# Patient Record
Sex: Female | Born: 1945 | Race: White | Hispanic: No | Marital: Married | Smoking: Never smoker
Health system: Southern US, Community
[De-identification: ages and names within clinical notes are randomized; demographics above are authoritative.]

## PROBLEM LIST (undated history)

## (undated) DIAGNOSIS — I10 Essential (primary) hypertension: Secondary | ICD-10-CM

## (undated) DIAGNOSIS — E785 Hyperlipidemia, unspecified: Secondary | ICD-10-CM

## (undated) DIAGNOSIS — E119 Type 2 diabetes mellitus without complications: Secondary | ICD-10-CM

## (undated) DIAGNOSIS — I82409 Acute embolism and thrombosis of unspecified deep veins of unspecified lower extremity: Secondary | ICD-10-CM

## (undated) DIAGNOSIS — C9000 Multiple myeloma not having achieved remission: Secondary | ICD-10-CM

## (undated) DIAGNOSIS — M1990 Unspecified osteoarthritis, unspecified site: Secondary | ICD-10-CM

## (undated) DIAGNOSIS — M179 Osteoarthritis of knee, unspecified: Secondary | ICD-10-CM

## (undated) DIAGNOSIS — I213 ST elevation (STEMI) myocardial infarction of unspecified site: Secondary | ICD-10-CM

## (undated) DIAGNOSIS — I2699 Other pulmonary embolism without acute cor pulmonale: Secondary | ICD-10-CM

## (undated) DIAGNOSIS — E669 Obesity, unspecified: Secondary | ICD-10-CM

## (undated) DIAGNOSIS — M1710 Unilateral primary osteoarthritis, unspecified knee: Secondary | ICD-10-CM

## (undated) DIAGNOSIS — Z9289 Personal history of other medical treatment: Secondary | ICD-10-CM

## (undated) DIAGNOSIS — N390 Urinary tract infection, site not specified: Secondary | ICD-10-CM

## (undated) HISTORY — PX: ANKLE FRACTURE SURGERY: SHX122

## (undated) HISTORY — PX: HERNIA REPAIR: SHX51

## (undated) HISTORY — PX: CLAVICLE SURGERY: SHX598

## (undated) HISTORY — PX: FRACTURE SURGERY: SHX138

## (undated) HISTORY — DX: Essential (primary) hypertension: I10

## (undated) HISTORY — DX: Obesity, unspecified: E66.9

## (undated) HISTORY — DX: Other pulmonary embolism without acute cor pulmonale: I26.99

## (undated) HISTORY — DX: Osteoarthritis of knee, unspecified: M17.9

## (undated) HISTORY — DX: Unilateral primary osteoarthritis, unspecified knee: M17.10

---
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
## 1989-08-18 HISTORY — PX: VAGINAL HYSTERECTOMY: SUR661

## 1999-11-24 ENCOUNTER — Other Ambulatory Visit: Admission: RE | Admit: 1999-11-24 | Discharge: 1999-11-24 | Payer: Self-pay | Admitting: Obstetrics and Gynecology

## 2001-03-08 ENCOUNTER — Other Ambulatory Visit: Admission: RE | Admit: 2001-03-08 | Discharge: 2001-03-08 | Payer: Self-pay | Admitting: Obstetrics and Gynecology

## 2001-07-23 ENCOUNTER — Encounter: Payer: Self-pay | Admitting: Orthopaedic Surgery

## 2001-07-23 ENCOUNTER — Encounter: Admission: RE | Admit: 2001-07-23 | Discharge: 2001-07-23 | Payer: Self-pay | Admitting: Orthopaedic Surgery

## 2002-04-02 ENCOUNTER — Other Ambulatory Visit: Admission: RE | Admit: 2002-04-02 | Discharge: 2002-04-02 | Payer: Self-pay | Admitting: Obstetrics and Gynecology

## 2002-10-05 ENCOUNTER — Inpatient Hospital Stay (HOSPITAL_COMMUNITY): Admission: EM | Admit: 2002-10-05 | Discharge: 2002-10-06 | Payer: Self-pay | Admitting: Emergency Medicine

## 2002-10-05 ENCOUNTER — Encounter: Payer: Self-pay | Admitting: Orthopedic Surgery

## 2002-10-05 ENCOUNTER — Encounter: Payer: Self-pay | Admitting: Emergency Medicine

## 2006-06-07 ENCOUNTER — Encounter: Admission: RE | Admit: 2006-06-07 | Discharge: 2006-06-07 | Payer: Self-pay | Admitting: Cardiology

## 2006-10-25 ENCOUNTER — Encounter: Admission: RE | Admit: 2006-10-25 | Discharge: 2006-10-25 | Payer: Self-pay | Admitting: Diagnostic Radiology

## 2006-11-01 ENCOUNTER — Encounter: Admission: RE | Admit: 2006-11-01 | Discharge: 2006-11-01 | Payer: Self-pay | Admitting: Interventional Radiology

## 2006-11-29 ENCOUNTER — Encounter: Admission: RE | Admit: 2006-11-29 | Discharge: 2006-11-29 | Payer: Self-pay | Admitting: Diagnostic Radiology

## 2007-09-12 ENCOUNTER — Encounter: Admission: RE | Admit: 2007-09-12 | Discharge: 2007-09-12 | Payer: Self-pay | Admitting: Diagnostic Radiology

## 2009-02-10 ENCOUNTER — Ambulatory Visit: Payer: Self-pay | Admitting: Cardiovascular Disease

## 2009-02-11 ENCOUNTER — Inpatient Hospital Stay (HOSPITAL_COMMUNITY): Admission: EM | Admit: 2009-02-11 | Discharge: 2009-02-13 | Payer: Self-pay | Admitting: Family Medicine

## 2009-10-04 ENCOUNTER — Observation Stay (HOSPITAL_COMMUNITY): Admission: EM | Admit: 2009-10-04 | Discharge: 2009-10-05 | Payer: Self-pay | Admitting: Emergency Medicine

## 2009-10-05 ENCOUNTER — Ambulatory Visit: Payer: Self-pay | Admitting: Vascular Surgery

## 2009-10-05 ENCOUNTER — Encounter (INDEPENDENT_AMBULATORY_CARE_PROVIDER_SITE_OTHER): Payer: Self-pay | Admitting: Emergency Medicine

## 2010-03-08 ENCOUNTER — Encounter: Admission: RE | Admit: 2010-03-08 | Discharge: 2010-03-08 | Payer: Self-pay | Admitting: Diagnostic Radiology

## 2010-11-08 ENCOUNTER — Ambulatory Visit: Payer: Self-pay | Admitting: Cardiology

## 2010-11-08 LAB — LIPID PANEL
HDL: 45 mg/dL (ref 35–70)
LDL Cholesterol: 177 mg/dL

## 2011-01-08 ENCOUNTER — Encounter: Payer: Self-pay | Admitting: Cardiology

## 2011-01-08 ENCOUNTER — Encounter: Payer: Self-pay | Admitting: Diagnostic Radiology

## 2011-01-09 ENCOUNTER — Encounter: Payer: Self-pay | Admitting: Diagnostic Radiology

## 2011-01-21 ENCOUNTER — Encounter: Payer: Self-pay | Admitting: Cardiology

## 2011-01-21 DIAGNOSIS — Q8500 Neurofibromatosis, unspecified: Secondary | ICD-10-CM | POA: Insufficient documentation

## 2011-01-21 DIAGNOSIS — E669 Obesity, unspecified: Secondary | ICD-10-CM | POA: Insufficient documentation

## 2011-01-21 DIAGNOSIS — E1122 Type 2 diabetes mellitus with diabetic chronic kidney disease: Secondary | ICD-10-CM | POA: Insufficient documentation

## 2011-01-21 DIAGNOSIS — M1710 Unilateral primary osteoarthritis, unspecified knee: Secondary | ICD-10-CM | POA: Insufficient documentation

## 2011-01-21 DIAGNOSIS — I10 Essential (primary) hypertension: Secondary | ICD-10-CM | POA: Insufficient documentation

## 2011-03-10 ENCOUNTER — Ambulatory Visit (INDEPENDENT_AMBULATORY_CARE_PROVIDER_SITE_OTHER): Payer: 59 | Admitting: Cardiology

## 2011-03-10 ENCOUNTER — Encounter: Payer: Self-pay | Admitting: Cardiology

## 2011-03-10 DIAGNOSIS — R5383 Other fatigue: Secondary | ICD-10-CM

## 2011-03-10 DIAGNOSIS — R079 Chest pain, unspecified: Secondary | ICD-10-CM | POA: Insufficient documentation

## 2011-03-10 DIAGNOSIS — J019 Acute sinusitis, unspecified: Secondary | ICD-10-CM | POA: Insufficient documentation

## 2011-03-10 DIAGNOSIS — E119 Type 2 diabetes mellitus without complications: Secondary | ICD-10-CM

## 2011-03-10 DIAGNOSIS — E7800 Pure hypercholesterolemia, unspecified: Secondary | ICD-10-CM

## 2011-03-10 LAB — COMPREHENSIVE METABOLIC PANEL
AST: 24 U/L (ref 0–37)
Alkaline Phosphatase: 56 U/L (ref 39–117)
Total Bilirubin: 0.7 mg/dL (ref 0.3–1.2)
Total Protein: 6.8 g/dL (ref 6.0–8.3)

## 2011-03-10 LAB — LIPID PANEL
Total CHOL/HDL Ratio: 5.4 Ratio
Triglycerides: 125 mg/dL (ref <150)

## 2011-03-10 MED ORDER — AMOXICILLIN 500 MG PO CAPS: 500.0000 mg | ORAL_CAPSULE | Freq: Three times a day (TID) | ORAL | Status: AC

## 2011-03-10 NOTE — Assessment & Plan Note (Signed)
The cause of her fatigue is not known.  It may be related to her recent sinusitis and bronchitis.  Less likely would be hypothyroidism or unrecognized anemia.  We will plan to have her return soon for a CBC and a TSH

## 2011-03-10 NOTE — Assessment & Plan Note (Signed)
We will treat her sinusitis and cough with amoxicillin 500 mg 3 times a day for one week.  We will get a CBC to look at her white count.  She will continue with Mucinex

## 2011-03-10 NOTE — Assessment & Plan Note (Signed)
Her electrocardiogram shows no ischemic changes.  She had a normal cardiac catheterization in February 2010.  We'll continue to observe and continue same blood pressure meds

## 2011-03-10 NOTE — Progress Notes (Signed)
History of Present Illness: This pleasant 65 year old woman is seen for a followup office visit.  He has a complex past medical history.  She has a history of neurofibromatosis.  She has a history of diabetes and essential hypertension.  In 2010 she was hospitalized with cellulitis of the right leg.  She had chest pain in February 2010 and had cardiac catheterization by Dr. Peter Swaziland which was normal on 02/11/09.  Recently she's been experiencing some left-sided chest discomfort often with exertion and relieved by rest.  There is been no left arm radiation.  The patient is also had several other recent symptoms including symptoms of sinusitis and bronchitis with yellow sputum but no fever.  For the past several weeks she has felt unaccountably week but has not had any evidence of anemia or melena or hematochezia.  She does not have any history of hypothyroidism.  Her EKG today shows normal sinus rhythm and no ischemic changes.  Current Outpatient Prescriptions  Medication Sig Dispense Refill  . Acetaminophen (TYLENOL PO) Take by mouth as needed.        Marland Kitchen aspirin 325 MG tablet Take 325 mg by mouth daily.        . Calcium Carbonate-Vitamin D (CALCIUM + D PO) Take by mouth daily.        Marland Kitchen CINNAMON PO Take by mouth. OCCASIONALLY       . fish oil-omega-3 fatty acids 1000 MG capsule Take by mouth daily.        Marland Kitchen glimepiride (AMARYL) 2 MG tablet Take 2.5 mg by mouth daily.        Marland Kitchen losartan-hydrochlorothiazide (HYZAAR) 100-12.5 MG per tablet Take 1 tablet by mouth daily.        . metFORMIN (GLUCOPHAGE) 1000 MG tablet Take 1,000 mg by mouth 2 (two) times daily.        . metoprolol (TOPROL-XL) 25 MG 24 hr tablet Take 25 mg by mouth daily.        . Multiple Vitamin (MULTIVITAMIN) tablet Take 1 tablet by mouth daily.        . Multiple Vitamins-Minerals (OCUVITE PO) Take by mouth daily.        . nitroGLYCERIN (NITROSTAT) 0.4 MG SL tablet Place 0.4 mg under the tongue every 5 (five) minutes as needed.          Marland Kitchen amoxicillin (AMOXIL) 500 MG capsule Take 1 capsule (500 mg total) by mouth 3 (three) times daily.  20 capsule  1  . losartan-hydrochlorothiazide (HYZAAR) 100-25 MG per tablet Take by mouth daily.         Allergies  Allergen Reactions  . Amaryl Nausea Only  . Crestor (Rosuvastatin Calcium)   . Lipitor (Atorvastatin Calcium)     MYALGIAS  . Lovastatin     MYALGIAS   . Niaspan (Niacin (Antihyperlipidemic)) Nausea Only  . Sumycin (Tetracycline Hcl)   . Zetia (Ezetimibe)   . Zithromax (Azithromycin)   . Zocor (Simvastatin - High Dose)     MYALGIAS  . Zocor (Simvastatin)     Muscle pain    Patient Active Problem List  Diagnoses  . Neurofibromatosis  . Diabetes mellitus  . HTN (hypertension)  . Obesity  . Osteoarthritis, knee    History  Smoking status  . Never Smoker   Smokeless tobacco  . Not on file    History  Alcohol Use: Not on file    Family History  Problem Relation Age of Onset  . Heart disease Mother   .  Arthritis Mother     Review of Systems: Constitutional: no fever chills diaphoresis or fatigue or change in weight.  Head and neck: no hearing loss, no epistaxis, no photophobia or visual disturbance. Respiratory: No cough, shortness of breath or wheezing. Cardiovascular: No chest pain peripheral edema, palpitations. Gastrointestinal: No abdominal distention, no abdominal pain, no change in bowel habits hematochezia or melena. Genitourinary: No dysuria, no frequency, no urgency, no nocturia. Musculoskeletal:No arthralgias, no back pain, no gait disturbance or myalgias. Neurological: No dizziness, no headaches, no numbness, no seizures, no syncope, no weakness, no tremors. Hematologic: No lymphadenopathy, no easy bruising. Psychiatric: No confusion, no hallucinations, no sleep disturbance.    Physical Exam: Her weight is 207, up 3 pounds.  Vital signs as recorded she is afebrile.Pupils equal and reactive.   Extraocular Movements are full.  There  is no scleral icterus.  The mouth and pharynx are normal.  The neck is supple.  The carotids reveal no bruits.  The jugular venous pressure is normal.  The thyroid is not enlarged.  There is no lymphadenopathy.  The sinuses are slightly tenderThe chest is clear to percussion and auscultation. There are no rales or rhonchi. Expansion of the chest is symmetrical.The precordium is quiet.  The first heart sound is normal.  The second heart sound is physiologically split.  There is no murmur gallop rub or click.  There is no abnormal lift or heave.The abdomen is soft and nontender. Bowel sounds are normal. The liver and spleen are not enlarged. There Are no abdominal masses. There are no bruits.The pedal pulses are good.  There is no phlebitis or edema.  There is no cyanosis or clubbing.Strength is normal and symmetrical in all extremities.  There is no lateralizing weakness.  There are no sensory deficits.  Integument shows multiple skin lesions consistent with neurofibromatosis.   Assessment / Plan:

## 2011-03-13 ENCOUNTER — Other Ambulatory Visit: Payer: Self-pay | Admitting: Cardiology

## 2011-03-13 ENCOUNTER — Other Ambulatory Visit (INDEPENDENT_AMBULATORY_CARE_PROVIDER_SITE_OTHER): Payer: 59 | Admitting: *Deleted

## 2011-03-13 DIAGNOSIS — R0789 Other chest pain: Secondary | ICD-10-CM

## 2011-03-13 DIAGNOSIS — Z79899 Other long term (current) drug therapy: Secondary | ICD-10-CM

## 2011-03-14 LAB — CBC WITH DIFFERENTIAL/PLATELET
Eosinophils Absolute: 0.2 10*3/uL (ref 0.0–0.7)
Eosinophils Relative: 2 % (ref 0–5)
HCT: 43.4 % (ref 36.0–46.0)
Hemoglobin: 14.2 g/dL (ref 12.0–15.0)
Lymphocytes Relative: 27 % (ref 12–46)
Lymphs Abs: 1.9 10*3/uL (ref 0.7–4.0)
MCH: 27.8 pg (ref 26.0–34.0)
MCV: 85.1 fL (ref 78.0–100.0)
Monocytes Absolute: 0.7 10*3/uL (ref 0.1–1.0)
Monocytes Relative: 10 % (ref 3–12)
RBC: 5.1 MIL/uL (ref 3.87–5.11)
WBC: 7.2 10*3/uL (ref 4.0–10.5)

## 2011-03-17 NOTE — Progress Notes (Signed)
Advised patient of labs 

## 2011-03-17 NOTE — Progress Notes (Signed)
Patient advised.

## 2011-03-23 LAB — CBC
HCT: 40.8 % (ref 36.0–46.0)
Hemoglobin: 14 g/dL (ref 12.0–15.0)
MCHC: 34.4 g/dL (ref 30.0–36.0)
MCV: 83.8 fL (ref 78.0–100.0)
Platelets: 220 10*3/uL (ref 150–400)
RBC: 4.87 MIL/uL (ref 3.87–5.11)
RDW: 13.4 % (ref 11.5–15.5)
WBC: 6 K/uL (ref 4.0–10.5)

## 2011-03-23 LAB — DIFFERENTIAL
Basophils Absolute: 0 10*3/uL (ref 0.0–0.1)
Basophils Relative: 0 % (ref 0–1)
Eosinophils Absolute: 0.1 10*3/uL (ref 0.0–0.7)
Eosinophils Relative: 1 % (ref 0–5)
Lymphocytes Relative: 29 % (ref 12–46)
Lymphs Abs: 1.8 10*3/uL (ref 0.7–4.0)
Monocytes Absolute: 1 10*3/uL (ref 0.1–1.0)
Monocytes Relative: 17 % — ABNORMAL HIGH (ref 3–12)
Neutro Abs: 3.2 K/uL (ref 1.7–7.7)
Neutrophils Relative %: 53 % (ref 43–77)

## 2011-03-23 LAB — BASIC METABOLIC PANEL WITH GFR
CO2: 28 meq/L (ref 19–32)
Calcium: 9.1 mg/dL (ref 8.4–10.5)
Creatinine, Ser: 0.99 mg/dL (ref 0.4–1.2)
GFR calc Af Amer: >60 mL/min (ref >60)

## 2011-03-23 LAB — BASIC METABOLIC PANEL
BUN: 20 mg/dL (ref 6–23)
Chloride: 96 mEq/L (ref 96–112)
GFR calc non Af Amer: 57 mL/min — ABNORMAL LOW (ref >60)
Glucose, Bld: 192 mg/dL — ABNORMAL HIGH (ref 70–99)
Potassium: 3.7 mEq/L (ref 3.5–5.1)
Sodium: 133 mEq/L — ABNORMAL LOW (ref 135–145)

## 2011-03-23 LAB — D-DIMER, QUANTITATIVE: D-Dimer, Quant: 0.66 ug{FEU}/mL — ABNORMAL HIGH (ref 0.00–0.48)

## 2011-03-23 LAB — APTT: aPTT: 28 seconds (ref 24–37)

## 2011-03-23 LAB — PROTIME-INR
INR: 1.03 (ref 0.00–1.49)
Prothrombin Time: 13.4 seconds (ref 11.6–15.2)

## 2011-04-04 LAB — LIPID PANEL
HDL: 29 mg/dL — ABNORMAL LOW (ref >39)
HDL: 30 mg/dL — ABNORMAL LOW (ref >39)
LDL Cholesterol: 133 mg/dL — ABNORMAL HIGH (ref 0–99)
Total CHOL/HDL Ratio: 6.3 RATIO
Triglycerides: 133 mg/dL (ref <150)
Triglycerides: 136 mg/dL (ref <150)
VLDL: 27 mg/dL (ref 0–40)
VLDL: 27 mg/dL (ref 0–40)

## 2011-04-04 LAB — CK TOTAL AND CKMB (NOT AT ARMC)
CK, MB: 2.3 ng/mL (ref 0.3–4.0)
Relative Index: INVALID (ref 0.0–2.5)
Total CK: 61 U/L (ref 7–177)

## 2011-04-04 LAB — CBC
HCT: 40.1 % (ref 36.0–46.0)
Hemoglobin: 14.2 g/dL (ref 12.0–15.0)
MCHC: 34.3 g/dL (ref 30.0–36.0)
MCHC: 34.6 g/dL (ref 30.0–36.0)
MCV: 83.6 fL (ref 78.0–100.0)
MCV: 84.7 fL (ref 78.0–100.0)
MCV: 84.8 fL (ref 78.0–100.0)
RBC: 4.45 MIL/uL (ref 3.87–5.11)
RBC: 4.73 MIL/uL (ref 3.87–5.11)
RBC: 4.84 MIL/uL (ref 3.87–5.11)
RDW: 13.3 % (ref 11.5–15.5)
WBC: 6.7 10*3/uL (ref 4.0–10.5)
WBC: 7.7 10*3/uL (ref 4.0–10.5)
WBC: 8.1 10*3/uL (ref 4.0–10.5)

## 2011-04-04 LAB — GLUCOSE, CAPILLARY
Glucose-Capillary: 165 mg/dL — ABNORMAL HIGH (ref 70–99)
Glucose-Capillary: 180 mg/dL — ABNORMAL HIGH (ref 70–99)
Glucose-Capillary: 187 mg/dL — ABNORMAL HIGH (ref 70–99)
Glucose-Capillary: 188 mg/dL — ABNORMAL HIGH (ref 70–99)
Glucose-Capillary: 200 mg/dL — ABNORMAL HIGH (ref 70–99)
Glucose-Capillary: 201 mg/dL — ABNORMAL HIGH (ref 70–99)
Glucose-Capillary: 213 mg/dL — ABNORMAL HIGH (ref 70–99)

## 2011-04-04 LAB — BASIC METABOLIC PANEL
BUN: 12 mg/dL (ref 6–23)
BUN: 8 mg/dL (ref 6–23)
CO2: 27 mEq/L (ref 19–32)
CO2: 30 mEq/L (ref 19–32)
Calcium: 8.7 mg/dL (ref 8.4–10.5)
Calcium: 8.7 mg/dL (ref 8.4–10.5)
Calcium: 8.8 mg/dL (ref 8.4–10.5)
Chloride: 103 mEq/L (ref 96–112)
Creatinine, Ser: 0.71 mg/dL (ref 0.4–1.2)
Creatinine, Ser: 0.73 mg/dL (ref 0.4–1.2)
Creatinine, Ser: 0.87 mg/dL (ref 0.4–1.2)
GFR calc Af Amer: >60 mL/min (ref >60)
GFR calc non Af Amer: >60 mL/min (ref >60)
Glucose, Bld: 196 mg/dL — ABNORMAL HIGH (ref 70–99)
Sodium: 135 mEq/L (ref 135–145)

## 2011-04-04 LAB — HEPARIN LEVEL (UNFRACTIONATED)
Heparin Unfractionated: 0.1 IU/mL — ABNORMAL LOW (ref 0.30–0.70)
Heparin Unfractionated: 0.16 IU/mL — ABNORMAL LOW (ref 0.30–0.70)
Heparin Unfractionated: 0.34 IU/mL (ref 0.30–0.70)
Heparin Unfractionated: <0.1 IU/mL — ABNORMAL LOW (ref 0.30–0.70)

## 2011-04-04 LAB — POCT CARDIAC MARKERS
CKMB, poc: 1.2 ng/mL (ref 1.0–8.0)
Myoglobin, poc: 49.6 ng/mL (ref 12–200)
Myoglobin, poc: 53.7 ng/mL (ref 12–200)

## 2011-04-04 LAB — DIFFERENTIAL
Basophils Absolute: 0 10*3/uL (ref 0.0–0.1)
Basophils Relative: 0 % (ref 0–1)
Lymphocytes Relative: 20 % (ref 12–46)
Monocytes Absolute: 1.1 10*3/uL — ABNORMAL HIGH (ref 0.1–1.0)
Neutro Abs: 5.2 10*3/uL (ref 1.7–7.7)
Neutrophils Relative %: 64 % (ref 43–77)

## 2011-04-04 LAB — TROPONIN I: Troponin I: 0.22 ng/mL — ABNORMAL HIGH (ref 0.00–0.06)

## 2011-04-04 LAB — CARDIAC PANEL(CRET KIN+CKTOT+MB+TROPI)
CK, MB: 1.8 ng/mL (ref 0.3–4.0)
Relative Index: INVALID (ref 0.0–2.5)
Relative Index: INVALID (ref 0.0–2.5)
Total CK: 46 U/L (ref 7–177)
Total CK: 53 U/L (ref 7–177)
Troponin I: 0.24 ng/mL — ABNORMAL HIGH (ref 0.00–0.06)
Troponin I: 0.24 ng/mL — ABNORMAL HIGH (ref 0.00–0.06)

## 2011-04-10 ENCOUNTER — Telehealth: Payer: Self-pay | Admitting: *Deleted

## 2011-04-10 MED ORDER — LOSARTAN POTASSIUM-HCTZ 100-12.5 MG PO TABS: 1.0000 | ORAL_TABLET | Freq: Every day | ORAL | Status: DC

## 2011-04-10 NOTE — Telephone Encounter (Signed)
Refilled meds per fax request. Chart had patient taking hyzaar 100/12.5 and 100/25, called patient and stated she had been on 100/12.5 entire time, refilled 100/12.5. Last office visit blood pressure within normal limits

## 2011-04-11 NOTE — Telephone Encounter (Signed)
Agree with plan 

## 2011-05-02 NOTE — Cardiovascular Report (Signed)
NAMESTEPHENIE, [name]             ACCOUNT NO.:  [id number]   MEDICAL RECORD NO.:  [id number]          PATIENT TYPE:  INP   LOCATION:  3707                         FACILITY:  [facility]   PHYSICIAN:  Peter M. Swaziland, M.D.  DATE OF BIRTH:  [birth year]   DATE OF PROCEDURE:  02/11/2009  DATE OF DISCHARGE:                            CARDIAC CATHETERIZATION   INDICATIONS FOR PROCEDURE:  This is a 65 year old white female with  history of diabetes and hyperlipidemia presented with substernal chest  pain and had elevated troponin levels but normal CPK, MB.   PROCEDURES:  1. Left heart catheterization.  2. Coronary and left ventricular angiography.   EQUIPMENT USED:  A 6-French 4-cm right and left Judkins catheter, 6-  French pigtail catheter, 6-French arterial sheath, 5-French left Judkins  3.5.   MEDICATIONS:  Local anesthesia with 1% Xylocaine and Versed 2 mg IV.   CONTRAST:  120 mL of Omnipaque.   HEMODYNAMIC DATA:  1. Aortic pressure was 106/60 with a mean of 82 mmHg.  2. Left ventricular pressure was 109 with an EDP of 13 mmHg.   ANGIOGRAPHIC DATA:  1. Left coronary artery rises normally.  2. The left main coronary is very short with essentially shared ostia.      It is normal.  3. Left anterior descending artery gives rise to 2 moderate diagonal      branches.  The LAD and diagonal branches are normal.  4. Left circumflex coronary artery is normal.  5. The right coronary artery is a large dominant vessel and is normal.   Left ventricular angiography performed in the RAO view demonstrates  normal left ventricular size and contractility with normal systolic  function.  Ejection fraction is estimated 65%.  There is no mitral  regurgitation or prolapse.   FINAL INTERPRETATION:  1. Normal coronary anatomy.  2. Normal left ventricular function.           ______________________________  Peter M. Swaziland, M.D.     PMJ/MEDQ  D:  02/11/2009  T:  02/12/2009  Job:  [id number]   cc:    Cassell Clement, M.D.  Brayton El, MD

## 2011-05-02 NOTE — H&P (Signed)
NAMECARMISHA, [name] NO.:  [id number]   MEDICAL RECORD NO.:  [id number]          PATIENT TYPE:  EMS   LOCATION:  MAJO                         FACILITY:  [facility]   PHYSICIAN:  Brayton El, MD    DATE OF BIRTH:  09/29/1946   DATE OF ADMISSION:  02/10/2009  DATE OF DISCHARGE:                              HISTORY & PHYSICAL   CHIEF COMPLAINT:  Chest pain.   HISTORY OF PRESENT ILLNESS:  Ms. [name] is a 65 year old white female  with past medical history significant for diabetes, hypertension and  borderline hyperlipidemia, who is presenting with a several hour history  of chest discomfort.  The patient states that today while in the seated  position she began to have substernal chest pressure.  There was no  radiation of the pressure, however, it was associated with mild  diaphoresis and mild shortness of breath.  She states that she feels the  pain did get slightly worse upon exertion.  The pain lasted for several  hours and relieved on its own here in the emergency room.  Other than  this chest pressure, the patient states she has been in her normal state  of health.  She states that she walks frequently and does not have any  problems with chest discomfort or dyspnea on exertion typically.   PAST MEDICAL HISTORY:  As above in HPI.   FAMILY HISTORY:  Her mother had heart failure at age 13.   SOCIAL HISTORY:  Negative for tobacco and alcohol.   ALLERGIES:  CODEINE MAKES HER NAUSEOUS.   MEDICATIONS:  1. Hyzaar 100/25 mg daily.  2. Metformin 1 gram b.i.d.  3. Toprol XL 25 mg daily.   REVIEW OF SYSTEMS:  The patient denies any edema, hematochezia, melena  or hematemesis.  All other systems as in HPI, otherwise negative.   PHYSICAL EXAMINATION:  VITAL SIGNS:  She has a temperature of 98.0,  pulse of 103, blood pressure 130/68.  Saturating 97% on room air.  GENERAL:  She is in no acute distress.  HEENT:  Pupils are equal, round and reactive to light.  NECK:  Supple.  There is no carotid bruit.  There is no JVD.  HEART:  Regular rate and rhythm without murmur, rub or gallop.  LUNGS:  Clear to auscultation bilaterally.  ABDOMEN:  Soft, nontender, nondistended.  EXTREMITIES:  Without edema.  NEURO:  Nonfocal.  SKIN:  Warm and dry with no rashes or lesions.  Pulses; the patient is  2+ bilateral radial pulses.  PSYCHIATRIC:  The patient is appropriate with normal levels of insight.   LABORATORY DATA:  Sodium 135, potassium 3.9, chloride 104, CO2 of 22,  BUN 8, creatinine 0.7, glucose 215.  White count 8, hemoglobin 14,  hematocrit 41, platelets 245.  Point of care of troponin is less than  0.05, however, when it was sent off to the lab, troponin was 0.22.  CK-  MB is 1.5, myoglobin 53.7.  Chest x-ray per radiology showed no acute  process.  EKG independently reviewed by myself shows normal sinus rhythm  with nonspecific T-wave abnormalities which is  unchanged from previous  EKG dated October 05, 2002.   ASSESSMENT:  The patient is having symptoms concerning for unstable  angina.  This is especially concerning considering her risk factors of  diabetes, hypertension and borderline hyperlipidemia.  Her troponin is  in the intermediate range, however, she has no EKG changes.  She is  currently chest pain free.   PLAN:  The patient will be admitted to a telemetry unit and ruled out  for acute myocardial infarction.  We will start the patient on aspirin  and anticoagulate her with heparin.  We will also place her on a statin,  beta-blocker and ACE inhibitor.  I would recommend a left heart  catheterization versus a stress test in the a.m. if the patient rules  out.  However, if the patient rules in a left heart catheterization  would clearly be in order.      Brayton El, MD  Electronically Signed     SGA/MEDQ  D:  02/11/2009  T:  02/11/2009  Job:  [id number]

## 2011-05-02 NOTE — Discharge Summary (Signed)
NAMEAAYLA, MARROCCO             ACCOUNT NO.:  [id number]   MEDICAL RECORD NO.:  [id number]          PATIENT TYPE:  INP   LOCATION:  3707                         FACILITY:  [facility]   PHYSICIAN:  Cassell Clement, M.D. DATE OF BIRTH:  [birth year]   DATE OF ADMISSION:  02/10/2009  DATE OF DISCHARGE:  02/13/2009                               DISCHARGE SUMMARY   FINAL DIAGNOSES:  1. Chest pain with elevated troponin, myocardial infarction ruled out.  2. Diabetes mellitus with fair control.  3. Essential hypertension.  4. Hyperlipidemia.   OPERATIONS PERFORMED:  1. Cardiac catheterization by Dr. Peter Swaziland on February 11, 2009,      with the findings or normal coronary anatomy and normal LV      function.  2. Spiral CT scan of chest negative for pulmonary emboli.   HISTORY:  This is a 65 year old Caucasian female admitted on the evening  of February 10, 2009, with chest pain.  She has a past history of  diabetes, hypertension, and hyperlipidemia.  She also has a past history  of neurofibromatosis (von Recklinghausen disease).  On the evening of  admission while seated, she began to have substernal chest pain.  There  was mild diaphoresis, mild shortness of breath.  The pain was slightly  worse on exertion.  She was given nitroglycerin after arrival in the  emergency room with relief of pain, but also caused severe headache.   Home medications included Hyzaar, metformin, and Toprol.   PHYSICAL EXAMINATION:  VITAL SIGNS:  Blood pressure 130/68, pulse was  103.  HEENT:  Negative.  CHEST:  Clear.  ABDOMEN:  Soft, nontender, and nondistended.  EXTREMITIES:  No edema.  There was no evidence of phlebitis.   LABORATORY DATA:  Initial labs, BUN was 8, creatinine 0.7, blood sugar  215.  Her white count was 8000, hemoglobin 14.  Her troponin was  elevated at 0.22.  CK-MB was 1.3.  Chest x-ray showed no acute process.  EKG showed only nonspecific ST-T wave changes.   The patient was  admitted to telemetry.  She was begun on aspirin and IV  heparin.  She was also placed on a statin, beta-blocker, and ACE  inhibitor.  On the day after admission, the patient did undergo cardiac  catheterization by Dr. Swaziland with results of normal coronary arteries  and normal LV function.  Her troponins continued to be elevated with  normal CK-MBs.  She gave a history that 1 week prior to admission she  had had some discomfort in her left calf raising question of pulmonary  embolus.  For this reason, she underwent CT scan of the chest on  February 12, 2009, which was negative for pulmonary emboli.  She was  also treated empirically for sinusitis and bronchitis with Mucinex and  amoxicillin.  She had no further chest discomfort and by February 13, 2009, was ready for discharge.   ACCESSORY LABORATORY STUDIES:  Hemoglobin of 13.6 on discharge, white  count 7300 on discharge after CT angiogram.  BUN was 12, creatinine  0.87, potassium 4.1, sodium 138, blood sugar 196, but she has  been off  her metformin while in the hospital.  Coagulation studies were normal.  Cardiac enzymes showed troponin of 0.24, 0.24, and 0.23 with normal CK-  MBs.  Her lipids were abnormal with cholesterol 190, LDL 133, HDL 30,  triglycerides 133.  Her hemoglobin A1c was 8.3.  B-natriuretic peptide  was less than 30.  Fibrin split products was 0.23.   The patient is being discharged improved on,  1. Hyzaar 100/25 one daily.  2. Metformin 1000 mg b.i.d.  3. Metoprolol XL 25 mg daily.  4. Multivitamin daily.  5. Full strength aspirin 325 one hour before Niaspan in the evening.  6. Niaspan 500 mg 1 at bedtime with a small snack.  7. Amoxicillin 500 mg 3 times a day for 3 more days.  8. Mucinex 600 mg twice a day as needed for head and chest congestion.  9. Nitrostat 1/50 sublingually p.r.n.  10.Tylenol 325 two every 6 hours as needed.   The patient will be rechecked in 6 weeks for followup office visit,  fasting  lipid panel, CMET, and A1c.   CONDITION ON DISCHARGE:  Improved.           ______________________________  Cassell Clement, M.D.     TB/MEDQ  D:  02/13/2009  T:  02/13/2009  Job:  [id number]

## 2011-05-05 NOTE — Op Note (Signed)
NAMEJAYANNA, [name]                         ACCOUNT NO.:  [id number]   MEDICAL RECORD NO.:  [id number]                   PATIENT TYPE:  INP   LOCATION:  1823                                 FACILITY:  [facility]   PHYSICIAN:  Mila Homer. Sherlean Foot, M.D.              DATE OF BIRTH:  July 20, 1946   DATE OF PROCEDURE:  10/05/2002  DATE OF DISCHARGE:                                 OPERATIVE REPORT   PREOPERATIVE DIAGNOSIS:  Right ankle trimalleolar ankle fracture.   POSTOPERATIVE DIAGNOSIS:  Right ankle trimalleolar ankle fracture.   PROCEDURE:  Right trimalleolar open reduction and internal fixation.   SURGEON:  Mila Homer. Sherlean Foot, M.D.   ASSISTANT:  Legrand Pitts. Duffy, P.A.   ANESTHESIA:  General.   TOURNIQUET TIME:  41 minutes.   COMPLICATIONS:  None.   DRAINS:  None.   INDICATION FOR PROCEDURE:  The patient is a 65 year old who twisted her  ankle this afternoon, was brought to the emergency department by her husband  and found to have a trimalleolar fracture.  I was called.  Informed consent  was obtained.  Preoperative labs were obtained.   DESCRIPTION OF PROCEDURE:  The patient was laid supine and administered  general anesthesia.  The right lower extremity was prepped and draped in the  usual sterile fashion.  The lateral malleolus was approached first with a  straight lateral incision with a #10 blade approximately 6 cm in length.  Sharp dissection was continued down to the lateral malleolus distally and  blunt dissection of approximately 50% of the wound.  I then cleaned off the  periosteum, identified the fracture site, cleansed it out with saline and a  dental pick and then reduced it with the serrated bone-holding clamp and  laid a seven-hole plate on the lateral aspect of the malleolus and placed  two distal cancellous screws, three bicortical proximal screws after placing  a lag screw in standard fashion through the fracture.  I then irrigated and  closed with interrupted 0  and 2-0 Vicryl and skin staples.  Then approached  the medial malleolus with a hockey stick incision, which was made with a #10  blade, blunt dissection down to the periosteum.  I sawed the periosteum with  a #15 blade and at this point reduced it under C-arm and placed two 2.0 mm K-  wires that were parallel.  I then removed one K-wire and placed a 3.5  partially-threaded cancellous screw 35 mm in length and then replaced the  second 2.0 mm K-wire in similar fashion.  I took AP, lateral, and mortise  views.  The posterior malleolar fracture reduced nicely to an anatomic  position.  The medial malleolus and lateral malleolus were reduced  anatomically.  We then cleansed this medial wound and closed with  interrupted 0, 2-0 Vicryl sutures, and skin staples.  Then cleansed the  wounds, infiltrated with approximately 10 cc of  0.5% Marcaine, and then at  this point dressed with Adaptic, 4 x 4's, sterile Webril, posterior and  stirrup splints, and a six-inch Ace wrap.                                               Mila Homer. Sherlean Foot, M.D.    SDL/MEDQ  D:  10/05/2002  T:  10/06/2002  Job:  [id number]

## 2011-06-28 ENCOUNTER — Other Ambulatory Visit: Payer: Self-pay | Admitting: *Deleted

## 2011-06-28 DIAGNOSIS — E785 Hyperlipidemia, unspecified: Secondary | ICD-10-CM

## 2011-06-29 ENCOUNTER — Encounter: Payer: Self-pay | Admitting: Cardiology

## 2011-07-17 ENCOUNTER — Other Ambulatory Visit (INDEPENDENT_AMBULATORY_CARE_PROVIDER_SITE_OTHER): Payer: 59 | Admitting: *Deleted

## 2011-07-17 ENCOUNTER — Ambulatory Visit (INDEPENDENT_AMBULATORY_CARE_PROVIDER_SITE_OTHER): Payer: 59 | Admitting: Cardiology

## 2011-07-17 ENCOUNTER — Encounter: Payer: Self-pay | Admitting: Cardiology

## 2011-07-17 ENCOUNTER — Other Ambulatory Visit: Payer: Self-pay | Admitting: Cardiology

## 2011-07-17 DIAGNOSIS — E785 Hyperlipidemia, unspecified: Secondary | ICD-10-CM

## 2011-07-17 DIAGNOSIS — E119 Type 2 diabetes mellitus without complications: Secondary | ICD-10-CM

## 2011-07-17 DIAGNOSIS — Q8500 Neurofibromatosis, unspecified: Secondary | ICD-10-CM

## 2011-07-17 DIAGNOSIS — B029 Zoster without complications: Secondary | ICD-10-CM | POA: Insufficient documentation

## 2011-07-17 DIAGNOSIS — I10 Essential (primary) hypertension: Secondary | ICD-10-CM

## 2011-07-17 DIAGNOSIS — R5383 Other fatigue: Secondary | ICD-10-CM

## 2011-07-17 LAB — LIPID PANEL
Cholesterol: 225 mg/dL — ABNORMAL HIGH (ref 0–200)
Total CHOL/HDL Ratio: 5
Triglycerides: 103 mg/dL (ref 0.0–149.0)

## 2011-07-17 LAB — BASIC METABOLIC PANEL
BUN: 19 mg/dL (ref 6–23)
Chloride: 105 mEq/L (ref 96–112)
Creatinine, Ser: 1 mg/dL (ref 0.4–1.2)
GFR: 62.18 mL/min (ref >60.00)
Glucose, Bld: 145 mg/dL — ABNORMAL HIGH (ref 70–99)

## 2011-07-17 LAB — HEPATIC FUNCTION PANEL
ALT: 26 U/L (ref 0–35)
Albumin: 4 g/dL (ref 3.5–5.2)
Total Bilirubin: 0.6 mg/dL (ref 0.3–1.2)

## 2011-07-17 NOTE — Assessment & Plan Note (Signed)
The patient has a history of diabetes mellitus.  She has not been expressing any hypoglycemic episodes.

## 2011-07-17 NOTE — Assessment & Plan Note (Signed)
The patient has a history of neurofibromatosis.  She has not been having any symptoms from her neurofibromatosis.

## 2011-07-17 NOTE — Progress Notes (Signed)
[name] Date of Birth:  Sep 10, 1946 [facility] [address]   [address] [place], [place]  [address] [phone]           Fax   [phone]  History of Present Illness: This pleasant 65 year old woman is seen for a scheduled followup office visit.  She has a history of neurofibromatosis.  She also has a history of diabetes mellitus and history of essential hypertension.  Recently she had an episode of herpes zoster requiring therapy with Valtrex.  She has had no recent chest pain.  She did have a normal cardiac catheterization on 02/11/09 by Dr. Sw[place].  Patient has a history of osteoarthritis particularly involving her left knee.  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  Current Outpatient Prescriptions  Medication Sig Dispense Refill  . Acetaminophen (TYLENOL PO) Take by mouth as needed.        Marland Kitchen aspirin 325 MG tablet Take 325 mg by mouth daily.        . Calcium Carbonate-Vitamin D (CALCIUM + D PO) Take by mouth daily.        Marland Kitchen CINNAMON PO Take by mouth. OCCASIONALLY       . fish oil-omega-3 fatty acids 1000 MG capsule Take by mouth daily.        Marland Kitchen glimepiride (AMARYL) 2 MG tablet Take 2.5 mg by mouth daily.        Marland Kitchen losartan-hydrochlorothiazide (HYZAAR) 100-12.5 MG per tablet Take 1 tablet by mouth daily.  90 tablet  3  . metFORMIN (GLUCOPHAGE) 1000 MG tablet Take 1,000 mg by mouth 2 (two) times daily.        . metoprolol (TOPROL-XL) 25 MG 24 hr tablet Take 25 mg by mouth daily.        . Multiple Vitamin (MULTIVITAMIN) tablet Take 1 tablet by mouth daily.        . Multiple Vitamins-Minerals (OCUVITE PO) Take by mouth daily.        . nitroGLYCERIN (NITROSTAT) 0.4 MG SL tablet Place 0.4 mg under the tongue every 5 (five) minutes as needed.          Allergies  Allergen Reactions  . Amaryl Nausea Only  . Crestor (Rosuvastatin Calcium)   . Lipitor (Atorvastatin Calcium)     MYALGIAS  . Lovastatin      MYALGIAS   . Niaspan (Niacin (Antihyperlipidemic)) Nausea Only  . Sumycin (Tetracycline Hcl)   . Zetia (Ezetimibe)   . Zithromax (Azithromycin)   . Zocor (Simvastatin - High Dose)     MYALGIAS  . Zocor (Simvastatin)     Muscle pain    Patient Active Problem List  Diagnoses  . Neurofibromatosis  . Diabetes mellitus  . HTN (hypertension)  . Obesity  . Osteoarthritis, knee  . Fatigue  . Chest pain  . Acute sinusitis, unspecified  . Herpes zoster    History  Smoking status  . Never Smoker   Smokeless tobacco  . Not on file    History  Alcohol Use: Not on file    Family History  Problem Relation Age of Onset  . Heart disease Mother   . Arthritis Mother     Review of Systems: Constitutional: no fever chills diaphoresis or fatigue or change in weight.  Head and neck: no hearing loss, no epistaxis, no photophobia or visual disturbance. Respiratory: No cough, shortness of breath or wheezing. Cardiovascular: No chest pain peripheral edema, palpitations. Gastrointestinal:  No abdominal distention, no abdominal pain, no change in bowel habits hematochezia or melena. Genitourinary: No dysuria, no frequency, no urgency, no nocturia. Musculoskeletal:No arthralgias, no back pain, no gait disturbance or myalgias. Neurological: No dizziness, no headaches, no numbness, no seizures, no syncope, no weakness, no tremors. Hematologic: No lymphadenopathy, no easy bruising. Psychiatric: No confusion, no hallucinations, no sleep disturbance.    Physical Exam: Filed Vitals:   07/17/11 0913  BP: 130/80  Pulse: 80  The general appearance reveals a well-developed well-nourished woman in no distress.  She does have skin lesions of neurofibromatosisPupils equal and reactive.   Extraocular Movements are full.  There is no scleral icterus.  The mouth and pharynx are normal.  The neck is supple.  The carotids reveal no bruits.  The jugular venous pressure is normal.  The thyroid is not  enlarged.  There is no lymphadenopathy.  The chest is clear to percussion and auscultation. There are no rales or rhonchi. Expansion of the chest is symmetrical.  The precordium is quiet.  The first heart sound is normal.  The second heart sound is physiologically split.  There is no murmur gallop rub or click.  There is no abnormal lift or heave.  The abdomen is soft and nontender. Bowel sounds are normal. The liver and spleen are not enlarged. There Are no abdominal masses. There are no bruits.  The pedal pulses are good.  There is no phlebitis or edema.  There is no cyanosis or clubbing.  Strength is normal and symmetrical in all extremities.  There is no lateralizing weakness.  There are no sensory deficits.   integument reveals healing lesions of herpes zoster on the right side of her neck and right upper posterior chest   Assessment / Plan: The patient is to continue present medication.  Continue weight loss and careful diet.  Recheck in 4 months for followup office visit.

## 2011-07-17 NOTE — Assessment & Plan Note (Signed)
The patient has a history of chronic fatigue.  On her last visit we checked her for anemia and thyroid condition.  She has been under a lot of emotional stress worrying about the health of her mother and also one of her sons who recently had chest pain and cardiac catheter.  I suspect that her fatigue is a manifestation of her chronic stress

## 2011-07-17 NOTE — Assessment & Plan Note (Signed)
The patient has a history of essential hypertension.  She has not been having any complications from her high blood pressure.  She does not have any history of coronary disease.  She had a normal cardiac catheterization on 02/11/09 after presenting to  with substernal chest pain and elevated troponin levels.  Not been having any dizziness or syncope or palpitations.

## 2011-07-17 NOTE — Assessment & Plan Note (Signed)
Recently the patient had an attack of herpes zoster which involved the Right side of her face and a few lesions on her right back.  She has finished up a course of Valtrex and she has been using triamcinolone cream.  Interestingly the patient's mother with whom she lives had a more serious case of herpes zoster a month before

## 2011-07-18 ENCOUNTER — Telehealth: Payer: Self-pay | Admitting: Cardiology

## 2011-07-18 NOTE — Telephone Encounter (Signed)
Lab results reported. Pt inquiring about Hgb A1C. Has office visit in Nov. Will discuss with Dr. Patty Sermons and get back to her.

## 2011-07-18 NOTE — Telephone Encounter (Signed)
Message copied by Karle Plumber on Tue Jul 18, 2011  1:51 PM ------      Message from: Cassell Clement      Created: Mon Jul 17, 2011  8:22 PM       Please report.  The blood sugar is slightly improved at 145.  The stress of having the shingles is probably keeping it high.The liver tests are all normal.The lipid tests are improved.  Continue same medication

## 2011-07-18 NOTE — Telephone Encounter (Signed)
Okay to get A1C at next OV

## 2011-07-19 ENCOUNTER — Other Ambulatory Visit: Payer: Self-pay | Admitting: Cardiology

## 2011-11-15 ENCOUNTER — Other Ambulatory Visit: Payer: 59 | Admitting: *Deleted

## 2011-11-15 ENCOUNTER — Ambulatory Visit (INDEPENDENT_AMBULATORY_CARE_PROVIDER_SITE_OTHER): Payer: 59 | Admitting: Cardiology

## 2011-11-15 ENCOUNTER — Encounter: Payer: Self-pay | Admitting: Cardiology

## 2011-11-15 ENCOUNTER — Telehealth: Payer: Self-pay | Admitting: *Deleted

## 2011-11-15 ENCOUNTER — Ambulatory Visit
Admission: RE | Admit: 2011-11-15 | Discharge: 2011-11-15 | Disposition: A | Discharge status: Home / Self Care | Payer: 59 | Source: Ambulatory Visit | Attending: Cardiology | Admitting: Cardiology

## 2011-11-15 ENCOUNTER — Other Ambulatory Visit: Payer: Self-pay | Admitting: Cardiology

## 2011-11-15 ENCOUNTER — Other Ambulatory Visit (INDEPENDENT_AMBULATORY_CARE_PROVIDER_SITE_OTHER): Payer: 59 | Admitting: *Deleted

## 2011-11-15 VITALS — BP 118/78 | HR 80 | Ht 63.0 in | Wt 204.0 lb

## 2011-11-15 DIAGNOSIS — I10 Essential (primary) hypertension: Secondary | ICD-10-CM

## 2011-11-15 DIAGNOSIS — E119 Type 2 diabetes mellitus without complications: Secondary | ICD-10-CM

## 2011-11-15 DIAGNOSIS — I119 Hypertensive heart disease without heart failure: Secondary | ICD-10-CM

## 2011-11-15 DIAGNOSIS — R0600 Dyspnea, unspecified: Secondary | ICD-10-CM | POA: Insufficient documentation

## 2011-11-15 DIAGNOSIS — R0602 Shortness of breath: Secondary | ICD-10-CM

## 2011-11-15 LAB — HEPATIC FUNCTION PANEL
Albumin: 3.6 g/dL (ref 3.5–5.2)
Alkaline Phosphatase: 56 U/L (ref 39–117)
Total Bilirubin: 0.8 mg/dL (ref 0.3–1.2)

## 2011-11-15 LAB — LDL CHOLESTEROL, DIRECT: Direct LDL: 161.2 mg/dL

## 2011-11-15 LAB — BASIC METABOLIC PANEL
CO2: 26 mEq/L (ref 19–32)
Chloride: 104 mEq/L (ref 96–112)
Creatinine, Ser: 0.9 mg/dL (ref 0.4–1.2)
Sodium: 140 mEq/L (ref 135–145)

## 2011-11-15 LAB — LIPID PANEL
Cholesterol: 217 mg/dL — ABNORMAL HIGH (ref 0–200)
Total CHOL/HDL Ratio: 5
VLDL: 18.4 mg/dL (ref 0.0–40.0)

## 2011-11-15 LAB — CBC WITH DIFFERENTIAL/PLATELET
Basophils Relative: 0.8 % (ref 0.0–3.0)
Eosinophils Relative: 1.8 % (ref 0.0–5.0)
HCT: 42.6 % (ref 36.0–46.0)
Hemoglobin: 14.4 g/dL (ref 12.0–15.0)
Lymphs Abs: 1.9 10*3/uL (ref 0.7–4.0)
Monocytes Relative: 10.5 % (ref 3.0–12.0)
Platelets: 284 10*3/uL (ref 150.0–400.0)
RBC: 4.98 Mil/uL (ref 3.87–5.11)
WBC: 6.6 10*3/uL (ref 4.5–10.5)

## 2011-11-15 LAB — BRAIN NATRIURETIC PEPTIDE: Pro B Natriuretic peptide (BNP): 30 pg/mL (ref 0.0–100.0)

## 2011-11-15 NOTE — Telephone Encounter (Signed)
Message copied by Burnell Blanks on Wed Nov 15, 2011  5:17 PM ------      Message from: Cassell Clement      Created: Wed Nov 15, 2011  1:18 PM       Please report to me chest x-ray shows normal heart size and clear lungs.  No sign of congestive heart failure by chest x-ray.  Await results of lab work.

## 2011-11-15 NOTE — Progress Notes (Signed)
[name] Date of Birth:  03/27/1946 [name] / [facility] [address]   [address] [place], [place]  [address] [phone]           Fax   [phone]  History of Present Illness: This pleasant 65 year old woman is seen for a scheduled followup office visit.  She has a history of neurofibromatosis.  She also has a history of essential hypertension and history of diabetes mellitus.  She does not have ischemic heart disease.  She had a normal cardiac catheterization on 02/11/09 at Dr. Swaziland.  She does have a history of exogenous obesity and osteoarthritis.  Current Outpatient Prescriptions  Medication Sig Dispense Refill  . Acetaminophen (TYLENOL PO) Take by mouth as needed.        Marland Kitchen aspirin 325 MG tablet Take 325 mg by mouth daily.        . Calcium Carbonate-Vitamin D (CALCIUM + D PO) Take by mouth daily.        Marland Kitchen CINNAMON PO Take by mouth. OCCASIONALLY       . fish oil-omega-3 fatty acids 1000 MG capsule Take by mouth daily.        Marland Kitchen glimepiride (AMARYL) 2 MG tablet Take 2 mg by mouth as directed. 1 & 1/2 to 2 tablets daily      . losartan-hydrochlorothiazide (HYZAAR) 100-12.5 MG per tablet Take 1 tablet by mouth daily.  90 tablet  3  . metFORMIN (GLUCOPHAGE) 1000 MG tablet Take 1,000 mg by mouth 2 (two) times daily.        . metoprolol (TOPROL-XL) 25 MG 24 hr tablet Take 25 mg by mouth daily.        . Multiple Vitamin (MULTIVITAMIN) tablet Take 1 tablet by mouth daily.        . Multiple Vitamins-Minerals (OCUVITE PO) Take by mouth daily.        . nitroGLYCERIN (NITROSTAT) 0.4 MG SL tablet Place 0.4 mg under the tongue every 5 (five) minutes as needed.          Allergies  Allergen Reactions  . Amaryl Nausea Only  . Crestor (Rosuvastatin Calcium)   . Lipitor (Atorvastatin Calcium)     MYALGIAS  . Lovastatin     MYALGIAS   . Niaspan (Niacin (Antihyperlipidemic)) Nausea Only  . Sumycin (Tetracycline Hcl)   . Zetia (Ezetimibe)   . Zithromax  (Azithromycin)   . Zocor (Simvastatin - High Dose)     MYALGIAS  . Zocor (Simvastatin)     Muscle pain    Patient Active Problem List  Diagnoses  . Neurofibromatosis  . Diabetes mellitus  . HTN (hypertension)  . Obesity  . Osteoarthritis, knee  . Fatigue  . Chest pain  . Acute sinusitis, unspecified  . Herpes zoster    History  Smoking status  . Never Smoker   Smokeless tobacco  . Not on file    History  Alcohol Use: Not on file    Family History  Problem Relation Age of Onset  . Heart disease Mother   . Arthritis Mother     Review of Systems: Constitutional: no fever chills diaphoresis or fatigue or change in weight.  Head and neck: no hearing loss, no epistaxis, no photophobia or visual disturbance. Respiratory: No cough, shortness of breath or wheezing. Cardiovascular: No chest pain peripheral edema, palpitations. Gastrointestinal: No abdominal distention, no abdominal pain, no change in bowel habits hematochezia or melena. Genitourinary: No dysuria, no frequency, no urgency, no nocturia. Musculoskeletal:No arthralgias, no  back pain, no gait disturbance or myalgias. Neurological: No dizziness, no headaches, no numbness, no seizures, no syncope, no weakness, no tremors. Hematologic: No lymphadenopathy, no easy bruising. Psychiatric: No confusion, no hallucinations, no sleep disturbance.    Physical Exam: Filed Vitals:   11/15/11 0942  BP: 118/78  Pulse: 80   the general appearance reveals a mildly obese middle-aged woman in no acute distress.Pupils equal and reactive.   Extraocular Movements are full.  There is no scleral icterus.  The mouth and pharynx are normal.  The neck is supple.  The carotids reveal no bruits.  The jugular venous pressure is normal.  The thyroid is not enlarged.  There is no lymphadenopathy.  The chest is clear to percussion and auscultation. There are no rales or rhonchi. Expansion of the chest is symmetrical.  The precordium is  quiet.  The first heart sound is normal.  The second heart sound is physiologically split.  There is no murmur gallop rub or click.  There is no abnormal lift or heave.  The abdomen is soft and nontender. Bowel sounds are normal. The liver and spleen are not enlarged. There Are no abdominal masses. There are no bruits.  The pedal pulses are good.  There is no phlebitis or edema.  There is no cyanosis or clubbing. Strength is normal and symmetrical in all extremities.  There is no lateralizing weakness.  There are no sensory deficits.  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  We're arranging for a chest x-ray and a two-dimensional echocardiogram.  No change in medication at this point.  Recheck for regular visit in 4 months including fasting lab work at that time.

## 2011-11-15 NOTE — Telephone Encounter (Signed)
Advised of results

## 2011-11-15 NOTE — Assessment & Plan Note (Signed)
The patient has not been experiencing any hypoglycemic episodes.  Her checking her hemoglobin A1c today.

## 2011-11-15 NOTE — Assessment & Plan Note (Signed)
Since last visit the patient has been experiencing dyspnea.  She's had orthopnea as well as occasional paroxysmal nocturnal dyspnea.  She has not been experiencing any peripheral edema.  Her last chest x-ray was in 2010.  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

## 2011-11-15 NOTE — Patient Instructions (Signed)
Will obtain labs today and call you with the results Get chest xray today (GI per patient request)  Your physician has requested that you have an echocardiogram. Echocardiography is a painless test that uses sound waves to create images of your heart. It provides your doctor with information about the size and shape of your heart and how well your heart's chambers and valves are working. This procedure takes approximately one hour. There are no restrictions for this procedure. Your physician recommends that you continue on your current medications as directed. Please refer to the Current Medication list given to you today. Your physician recommends that you schedule a follow-up appointment in: 4 months with fasting labs (lp/bmet/hfp)

## 2011-11-15 NOTE — Assessment & Plan Note (Signed)
The patient has a history of essential hypertension.  Blood pressure has been stable on current medicines.  She's having no dizziness or syncope.  No headaches.  She is under a lot of stress having to look after her elderly mother.

## 2011-11-16 ENCOUNTER — Other Ambulatory Visit: Payer: Self-pay | Admitting: Cardiology

## 2011-11-16 MED ORDER — GLIMEPIRIDE 2 MG PO TABS: 2.0000 mg | ORAL_TABLET | ORAL | Status: DC

## 2011-11-16 NOTE — Telephone Encounter (Signed)
New problem Pt was here yesterday and lost meds She needs a new rx for glimepride there was no refills [facility] [address]

## 2011-11-16 NOTE — Telephone Encounter (Signed)
Sent Rx for glimepride to [facility] [address]

## 2011-11-17 ENCOUNTER — Telehealth: Payer: Self-pay | Admitting: *Deleted

## 2011-11-17 NOTE — Telephone Encounter (Signed)
Message copied by Burnell Blanks on Fri Nov 17, 2011  1:40 PM ------      Message from: Cassell Clement      Created: Wed Nov 15, 2011  7:06 PM       The test for heart failure was normal.      CBC normal.  No anemia.      Liver and kidneys normal.      LDL is still high.  Work harder on diet since she cannot tolerate the lipid lowering drugs.

## 2011-11-17 NOTE — Telephone Encounter (Signed)
Advised of increase 

## 2011-11-17 NOTE — Telephone Encounter (Signed)
Message copied by Burnell Blanks on Fri Nov 17, 2011  1:40 PM ------      Message from: Cassell Clement      Created: Wed Nov 15, 2011  7:02 PM       The A1C is better but still too high 7.5.  Increase glimepiride [amaryl] to two tablets each am.  Careful diet.

## 2011-11-17 NOTE — Telephone Encounter (Signed)
Advised of labs 

## 2011-11-21 ENCOUNTER — Ambulatory Visit (HOSPITAL_COMMUNITY): Payer: 59 | Attending: Cardiology | Admitting: Radiology

## 2011-11-21 ENCOUNTER — Other Ambulatory Visit (HOSPITAL_COMMUNITY): Payer: Self-pay | Admitting: Cardiology

## 2011-11-21 DIAGNOSIS — R0602 Shortness of breath: Secondary | ICD-10-CM

## 2011-11-21 DIAGNOSIS — E119 Type 2 diabetes mellitus without complications: Secondary | ICD-10-CM | POA: Insufficient documentation

## 2011-11-21 DIAGNOSIS — I119 Hypertensive heart disease without heart failure: Secondary | ICD-10-CM

## 2011-11-21 DIAGNOSIS — R0609 Other forms of dyspnea: Secondary | ICD-10-CM

## 2011-11-21 DIAGNOSIS — R0600 Dyspnea, unspecified: Secondary | ICD-10-CM

## 2011-11-21 DIAGNOSIS — I10 Essential (primary) hypertension: Secondary | ICD-10-CM | POA: Insufficient documentation

## 2011-11-21 DIAGNOSIS — R0989 Other specified symptoms and signs involving the circulatory and respiratory systems: Secondary | ICD-10-CM | POA: Insufficient documentation

## 2011-11-21 MED ORDER — PERFLUTREN PROTEIN A MICROSPH IV SUSP
3.0000 mL | Freq: Once | INTRAVENOUS | Status: AC
  Administered 2011-11-21: 3 mL via INTRAVENOUS

## 2011-11-22 ENCOUNTER — Telehealth: Payer: Self-pay | Admitting: *Deleted

## 2011-11-22 NOTE — Telephone Encounter (Signed)
Message copied by Burnell Blanks on Wed Nov 22, 2011 10:49 AM ------      Message from: Cassell Clement      Created: Tue Nov 21, 2011  4:00 PM       Please report.  The echocardiogram was normal.  Left ventricular function was normal.  No cause for her dyspnea found on echo.

## 2011-11-22 NOTE — Telephone Encounter (Signed)
Advised of echo  

## 2012-01-25 ENCOUNTER — Ambulatory Visit: Payer: 59

## 2012-01-25 ENCOUNTER — Ambulatory Visit (INDEPENDENT_AMBULATORY_CARE_PROVIDER_SITE_OTHER): Payer: 59 | Admitting: Emergency Medicine

## 2012-01-25 VITALS — BP 124/82 | HR 84 | Temp 97.8°F | Resp 18 | Ht 63.0 in | Wt 200.0 lb

## 2012-01-25 DIAGNOSIS — M25569 Pain in unspecified knee: Secondary | ICD-10-CM

## 2012-01-25 DIAGNOSIS — M25562 Pain in left knee: Secondary | ICD-10-CM

## 2012-01-25 DIAGNOSIS — J329 Chronic sinusitis, unspecified: Secondary | ICD-10-CM

## 2012-01-25 MED ORDER — MELOXICAM 15 MG PO TABS: 15.0000 mg | ORAL_TABLET | Freq: Every day | ORAL | Status: DC

## 2012-01-25 MED ORDER — AMOXICILLIN 500 MG PO CAPS: 1000.0000 mg | ORAL_CAPSULE | Freq: Two times a day (BID) | ORAL | Status: AC

## 2012-01-25 NOTE — Progress Notes (Signed)
  Subjective:    Patient ID: [name], female    DOB: Jul 13, 1946, 66 y.o.   MRN: [id number]  HPI    Review of Systems     Objective:   Physical Exam   [facility] reading (PRIMARY) by  Dr.Zaidee Rion films of the left knee reveal narrowing of the medial joint space without fracture..      Assessment & Plan:

## 2012-01-25 NOTE — Progress Notes (Signed)
Addended by: Lesle Chris A on: 01/25/2012 12:36 PM   Modules accepted: Orders

## 2012-01-25 NOTE — Progress Notes (Signed)
  Subjective:    Patient ID: [name], female    DOB: 1945/12/23, 66 y.o.   MRN: [id number]  HPI is in a patient who's with 2 complaints. The first complaint is purulent drainage from both sides of her nose. She has had congestion sore throat and sinus drainage. Second complaint is of persistent pain in her left knee she has discomfort when stressing good    Review of Systems she has a history of diabetes mellitus. Her last hemoglobin A1c was 6. She is requesting transfer her care to our office because he is no longer taking care primary care issues.     Objective:   Physical Exam physical exam of the skin reveals evidence of neural fibromatosis the TMs are clear nose is congested with bilateral purulent drainage. Throat is normal. Lungs are clear.  [facility] reading (PRIMARY) by  Dr. Cleta Alberts x-ray reveals loss of the medial joint space. There are no other bony lesions seen.       Assessment & Plan:  It appears that the patient has an acute sinusitis and will need antibiotic treatment for this she also has been having significant amount of pain in her knee most of the pain is located medially.

## 2012-02-06 ENCOUNTER — Other Ambulatory Visit: Payer: Self-pay | Admitting: Cardiology

## 2012-03-06 ENCOUNTER — Other Ambulatory Visit: Payer: Self-pay | Admitting: *Deleted

## 2012-03-06 MED ORDER — METOPROLOL SUCCINATE ER 25 MG PO TB24: 25.0000 mg | ORAL_TABLET | Freq: Every day | ORAL | Status: DC

## 2012-03-11 ENCOUNTER — Encounter: Payer: Self-pay | Admitting: Cardiology

## 2012-03-11 ENCOUNTER — Other Ambulatory Visit (INDEPENDENT_AMBULATORY_CARE_PROVIDER_SITE_OTHER): Payer: 59

## 2012-03-11 ENCOUNTER — Ambulatory Visit (INDEPENDENT_AMBULATORY_CARE_PROVIDER_SITE_OTHER): Payer: 59 | Admitting: Cardiology

## 2012-03-11 VITALS — BP 110/76 | HR 88 | Ht 63.0 in | Wt 200.0 lb

## 2012-03-11 DIAGNOSIS — R0600 Dyspnea, unspecified: Secondary | ICD-10-CM

## 2012-03-11 DIAGNOSIS — E119 Type 2 diabetes mellitus without complications: Secondary | ICD-10-CM

## 2012-03-11 DIAGNOSIS — I119 Hypertensive heart disease without heart failure: Secondary | ICD-10-CM

## 2012-03-11 LAB — BASIC METABOLIC PANEL
BUN: 16 mg/dL (ref 6–23)
Calcium: 9.1 mg/dL (ref 8.4–10.5)
Chloride: 104 mEq/L (ref 96–112)
Creatinine, Ser: 0.9 mg/dL (ref 0.4–1.2)
GFR: 70.45 mL/min (ref >60.00)

## 2012-03-11 LAB — HEPATIC FUNCTION PANEL
ALT: 21 U/L (ref 0–35)
AST: 21 U/L (ref 0–37)
Alkaline Phosphatase: 49 U/L (ref 39–117)
Bilirubin, Direct: 0 mg/dL (ref 0.0–0.3)
Total Protein: 6.7 g/dL (ref 6.0–8.3)

## 2012-03-11 LAB — LDL CHOLESTEROL, DIRECT: Direct LDL: 174.3 mg/dL

## 2012-03-11 NOTE — Patient Instructions (Signed)
Your physician recommends that you continue on your current medications as directed. Please refer to the Current Medication list given to you today. Your physician wants you to follow-up in: 4 months You will receive a reminder letter in the mail two months in advance. If you don't receive a letter, please call our office to schedule the follow-up appointment.  

## 2012-03-11 NOTE — Assessment & Plan Note (Signed)
The patient has a history of diabetes mellitus.  She had one episode of hypoglycemia since last visit which occurred at about 1:30 PM on the day that she had skipped lunch.

## 2012-03-11 NOTE — Progress Notes (Signed)
[name] Date of Birth:  [birth year] [facility] [address] [place], [place]  [address] [phone]         Fax   [phone]  History of Present Illness: This pleasant 66 year old woman is seen for a scheduled four-month followup office visit.  She has a history of neurofibromatosis.  She has a history of essential hypertension and history of diabetes mellitus.  She does not have any history of ischemic heart disease.  She has a past history of atypical chest pain but had a normal cardiac catheterization on 02/11/09 I. Dr. [place].  She's had a history of osteoarthritis and history of exogenous obesity.  She's had a past history of dyspnea.  She had a chest x-ray on 1128/12 which showed heart size upper normal and lungs were clear.  She had an echocardiogram on 11/21/11 which showed normal left ventricular systolic function with an ejection fraction of 55-60%.  Current Outpatient Prescriptions  Medication Sig Dispense Refill  . Acetaminophen (TYLENOL PO) Take by mouth as needed.        Marland Kitchen aspirin 325 MG tablet Take 325 mg by mouth daily.        . Calcium Carbonate-Vitamin D (CALCIUM + D PO) Take by mouth daily.        Marland Kitchen CINNAMON PO Take by mouth. OCCASIONALLY       . fish oil-omega-3 fatty acids 1000 MG capsule Take by mouth daily.        Marland Kitchen glimepiride (AMARYL) 2 MG tablet Take 1 tablet (2 mg total) by mouth as directed. 1 & 1/2 to 2 tablets daily  60 tablet  12  . losartan-hydrochlorothiazide (HYZAAR) 100-12.5 MG per tablet TAKE 1 TABLET BY MOUTH EVERY DAY  90 tablet  2  . meloxicam (MOBIC) 15 MG tablet Take 1 tablet (15 mg total) by mouth daily.  14 tablet  1  . metFORMIN (GLUCOPHAGE) 1000 MG tablet Take 1,000 mg by mouth 2 (two) times daily.        . metoprolol succinate (TOPROL-XL) 25 MG 24 hr tablet Take 1 tablet (25 mg total) by mouth daily.  90 tablet  2  . Multiple Vitamin (MULTIVITAMIN) tablet Take 1 tablet by mouth daily.        . Multiple  Vitamins-Minerals (OCUVITE PO) Take by mouth daily.        . nitroGLYCERIN (NITROSTAT) 0.4 MG SL tablet Place 0.4 mg under the tongue every 5 (five) minutes as needed.          Allergies  Allergen Reactions  . Amaryl Nausea Only  . Crestor (Rosuvastatin Calcium)   . Lipitor (Atorvastatin Calcium)     MYALGIAS  . Lovastatin     MYALGIAS   . Niaspan (Niacin (Antihyperlipidemic)) Nausea Only  . Sumycin (Tetracycline Hcl)   . Zetia (Ezetimibe)   . Zithromax (Azithromycin)   . Zocor (Simvastatin - High Dose)     MYALGIAS  . Zocor (Simvastatin)     Muscle pain    Patient Active Problem List  Diagnoses  . Neurofibromatosis  . Diabetes mellitus  . HTN (hypertension)  . Obesity  . Osteoarthritis, knee  . Fatigue  . Chest pain  . Acute sinusitis, unspecified  . Herpes zoster  . Dyspnea    History  Smoking status  . Never Smoker   Smokeless tobacco  . Not on file    History  Alcohol Use: Not on file    Family History  Problem Relation Age of Onset  .  Heart disease Mother   . Arthritis Mother     Review of Systems: Constitutional: no fever chills diaphoresis or fatigue or change in weight.  Head and neck: no hearing loss, no epistaxis, no photophobia or visual disturbance. Respiratory: No cough, shortness of breath or wheezing. Cardiovascular: No chest pain peripheral edema, palpitations. Gastrointestinal: No abdominal distention, no abdominal pain, no change in bowel habits hematochezia or melena. Genitourinary: No dysuria, no frequency, no urgency, no nocturia. Musculoskeletal:No arthralgias, no back pain, no gait disturbance or myalgias. Neurological: No dizziness, no headaches, no numbness, no seizures, no syncope, no weakness, no tremors. Hematologic: No lymphadenopathy, no easy bruising. Psychiatric: No confusion, no hallucinations, no sleep disturbance.    Physical Exam: Filed Vitals:   03/11/12 0948  BP: 110/76  Pulse: 88   the general appearance  reveals a well-developed well-nourished woman in no distress.The head and neck exam reveals pupils equal and reactive.  Extraocular movements are full.  There is no scleral icterus.  The mouth and pharynx are normal.  The neck is supple.  The carotids reveal no bruits.  The jugular venous pressure is normal.  The  thyroid is not enlarged.  There is no lymphadenopathy.  The chest is clear to percussion and auscultation.  There are no rales or rhonchi.  Expansion of the chest is symmetrical.  The precordium is quiet.  The first heart sound is normal.  The second heart sound is physiologically split.  There is no murmur gallop rub or click.  There is no abnormal lift or heave.  The abdomen is soft and nontender.  The bowel sounds are normal.  The liver and spleen are not enlarged.  There are no abdominal masses.  There are no abdominal bruits.  Extremities reveal good pedal pulses.  There is no phlebitis or edema.  There is no cyanosis or clubbing.  Strength is normal and symmetrical in all extremities.  There is no lateralizing weakness.  There are no sensory deficits.  The skin is warm and dry.  There is no rash.  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  She will continue same medication.  Recheck in 4 months for office visit A1c lipid panel hepatic function panel and basal metabolic panel.

## 2012-03-11 NOTE — Assessment & Plan Note (Signed)
Her dyspnea has improved since she has lost weight.  Her weight is down 4 pounds since last visit.

## 2012-04-04 ENCOUNTER — Other Ambulatory Visit: Payer: Self-pay | Admitting: *Deleted

## 2012-04-04 MED ORDER — METFORMIN HCL 1000 MG PO TABS: 1000.0000 mg | ORAL_TABLET | Freq: Two times a day (BID) | ORAL | Status: DC

## 2012-04-04 NOTE — Telephone Encounter (Signed)
Refilled metformin

## 2012-05-10 ENCOUNTER — Ambulatory Visit: Payer: 59 | Admitting: Internal Medicine

## 2012-05-18 HISTORY — PX: CORONARY ANGIOPLASTY WITH STENT PLACEMENT: SHX49

## 2012-05-21 ENCOUNTER — Encounter: Payer: Self-pay | Admitting: Emergency Medicine

## 2012-05-21 ENCOUNTER — Ambulatory Visit (INDEPENDENT_AMBULATORY_CARE_PROVIDER_SITE_OTHER): Payer: 59 | Admitting: Emergency Medicine

## 2012-05-21 VITALS — BP 125/78 | HR 85 | Temp 97.0°F | Resp 16 | Ht 63.0 in | Wt 196.0 lb

## 2012-05-21 DIAGNOSIS — E119 Type 2 diabetes mellitus without complications: Secondary | ICD-10-CM

## 2012-05-21 DIAGNOSIS — Q8500 Neurofibromatosis, unspecified: Secondary | ICD-10-CM

## 2012-05-21 DIAGNOSIS — Z0000 Encounter for general adult medical examination without abnormal findings: Secondary | ICD-10-CM

## 2012-05-21 DIAGNOSIS — M25562 Pain in left knee: Secondary | ICD-10-CM

## 2012-05-21 DIAGNOSIS — E669 Obesity, unspecified: Secondary | ICD-10-CM

## 2012-05-21 LAB — POCT URINALYSIS DIPSTICK
Ketones, UA: NEGATIVE
Nitrite, UA: NEGATIVE
Protein, UA: NEGATIVE
pH, UA: 5

## 2012-05-21 LAB — CBC WITH DIFFERENTIAL/PLATELET
Basophils Relative: 1 % (ref 0–1)
HCT: 42.3 % (ref 36.0–46.0)
Hemoglobin: 14.7 g/dL (ref 12.0–15.0)
Lymphocytes Relative: 24 % (ref 12–46)
Monocytes Absolute: 0.7 10*3/uL (ref 0.1–1.0)
Monocytes Relative: 11 % (ref 3–12)
Neutro Abs: 4.1 10*3/uL (ref 1.7–7.7)
Neutrophils Relative %: 62 % (ref 43–77)
RBC: 5.1 MIL/uL (ref 3.87–5.11)
WBC: 6.5 10*3/uL (ref 4.0–10.5)

## 2012-05-21 LAB — POCT GLYCOSYLATED HEMOGLOBIN (HGB A1C): Hemoglobin A1C: 6.3

## 2012-05-21 LAB — POCT UA - MICROSCOPIC ONLY
Casts, Ur, LPF, POC: NEGATIVE
Yeast, UA: NEGATIVE

## 2012-05-21 MED ORDER — MELOXICAM 15 MG PO TABS: 15.0000 mg | ORAL_TABLET | Freq: Every day | ORAL | Status: DC

## 2012-05-21 NOTE — Progress Notes (Signed)
Subjective:    Patient ID: [name], female    DOB: Nov 27, 1946, 66 y.o.   MRN: [id number]  HPI patient comes in for her physical exam. She sees Dr. Patty Sermons a regular basis. She is due to see Dr. Ambrose Mantle tomorrow for her regular GYN check. She overall is doing well.    Review of Systems  Constitutional: Negative.   HENT: Negative.   Eyes: Negative.   Respiratory: Negative.   Cardiovascular:       She sees Dr. Patty Sermons one-time year for cardiac issues.  Gastrointestinal:       Colonoscopy with Dr. Ewing Schlein 2012  Genitourinary:       Due to see Dr. Ambrose Mantle normal  Musculoskeletal: Positive for joint swelling. Negative for back pain.  Skin:       She is known to have neurofibromatosis.  Neurological: Negative.   Hematological: Negative.   Psychiatric/Behavioral: Negative.        Objective:   Physical Exam  Constitutional: She appears well-developed and well-nourished.  HENT:  Head: Normocephalic and atraumatic.  Right Ear: External ear normal.  Left Ear: External ear normal.  Eyes: Pupils are equal, round, and reactive to light. Right eye exhibits no discharge. Left eye exhibits no discharge. No scleral icterus.  Neck:       She has a web neck deformity  Cardiovascular: Normal rate and regular rhythm.  Exam reveals no gallop and no friction rub.   No murmur heard. Pulmonary/Chest: Effort normal and breath sounds normal. No respiratory distress. She has no wheezes. She has no rales.  Abdominal: Soft. She exhibits no distension. There is no tenderness. There is no rebound.  Musculoskeletal:       She has degenerative changes of the left knee  Neurological: She is alert. No cranial nerve deficit. Coordination normal.  Skin:       There are multiple neurofibromas present throughout the scan. There is a 1 x 2 cm firm nodule just beneath the right costal margin    Results for orders placed in visit on 05/21/12  GLUCOSE, POCT (MANUAL RESULT ENTRY)      Component Value  Range   POC Glucose 103 (*) 70 - 99 (mg/dl)  POCT GLYCOSYLATED HEMOGLOBIN (HGB A1C)      Component Value Range   Hemoglobin A1C 6.3    POCT URINALYSIS DIPSTICK      Component Value Range   Color, UA yellow     Clarity, UA slightly cloudy     Glucose, UA neg     Bilirubin, UA neg     Ketones, UA neg     Spec Grav, UA 1.010     Blood, UA neg     pH, UA 5.0     Protein, UA neg     Urobilinogen, UA 0.2     Nitrite, UA neg     Leukocytes, UA small (1+)    POCT UA - MICROSCOPIC ONLY      Component Value Range   WBC, Ur, HPF, POC 0-8     RBC, urine, microscopic 0-1     Bacteria, U Microscopic 1+     Mucus, UA trace     Epithelial cells, urine per micros 3-12     Crystals, Ur, HPF, POC neg     Casts, Ur, LPF, POC neg     Yeast, UA neg      Results for orders placed in visit on 03/11/12  LIPID PANEL      Component Value  Range   Cholesterol 223 (*) 0 - 200 (mg/dL)   Triglycerides 16.1  0.0 - 149.0 (mg/dL)   HDL 09.60  >45.40 (mg/dL)   VLDL 98.1  0.0 - 19.1 (mg/dL)   Total CHOL/HDL Ratio 5    HEPATIC FUNCTION PANEL      Component Value Range   Total Bilirubin 0.5  0.3 - 1.2 (mg/dL)   Bilirubin, Direct 0.0  0.0 - 0.3 (mg/dL)   Alkaline Phosphatase 49  39 - 117 (U/L)   AST 21  0 - 37 (U/L)   ALT 21  0 - 35 (U/L)   Total Protein 6.7  6.0 - 8.3 (g/dL)   Albumin 3.7  3.5 - 5.2 (g/dL)  BASIC METABOLIC PANEL      Component Value Range   Sodium 141  135 - 145 (mEq/L)   Potassium 4.0  3.5 - 5.1 (mEq/L)   Chloride 104  96 - 112 (mEq/L)   CO2 28  19 - 32 (mEq/L)   Glucose, Bld 168 (*) 70 - 99 (mg/dL)   BUN 16  6 - 23 (mg/dL)   Creatinine, Ser 0.9  0.4 - 1.2 (mg/dL)   Calcium 9.1  8.4 - 47.8 (mg/dL)   GFR 29.56  >21.30 (mL/min)  LDL CHOLESTEROL, DIRECT      Component Value Range   Direct LDL 174.3       Assessment & Plan:

## 2012-05-21 NOTE — Patient Instructions (Signed)
No change in medications at the present time. Advise recheck in 4-6 months .

## 2012-05-22 LAB — LIPID PANEL
Cholesterol: 213 mg/dL — ABNORMAL HIGH (ref 0–200)
HDL: 44 mg/dL (ref >39)
LDL Cholesterol: 140 mg/dL — ABNORMAL HIGH (ref 0–99)
Triglycerides: 147 mg/dL (ref <150)
VLDL: 29 mg/dL (ref 0–40)

## 2012-05-22 LAB — MICROALBUMIN, URINE: Microalb, Ur: 0.5 mg/dL (ref 0.00–1.89)

## 2012-05-22 LAB — COMPREHENSIVE METABOLIC PANEL
ALT: 22 U/L (ref 0–35)
AST: 20 U/L (ref 0–37)
BUN: 17 mg/dL (ref 6–23)
CO2: 28 mEq/L (ref 19–32)
Calcium: 9.7 mg/dL (ref 8.4–10.5)
Chloride: 103 mEq/L (ref 96–112)
Creat: 0.83 mg/dL (ref 0.50–1.10)
Total Bilirubin: 0.7 mg/dL (ref 0.3–1.2)

## 2012-05-22 LAB — TSH: TSH: 0.872 u[IU]/mL (ref 0.350–4.500)

## 2012-05-24 ENCOUNTER — Other Ambulatory Visit: Payer: Self-pay | Admitting: Obstetrics and Gynecology

## 2012-05-24 DIAGNOSIS — Z1231 Encounter for screening mammogram for malignant neoplasm of breast: Secondary | ICD-10-CM

## 2012-05-29 ENCOUNTER — Emergency Department (HOSPITAL_COMMUNITY): Payer: 59

## 2012-05-29 ENCOUNTER — Encounter (HOSPITAL_COMMUNITY): Payer: Self-pay

## 2012-05-29 ENCOUNTER — Encounter (HOSPITAL_COMMUNITY)
Admission: EM | Disposition: A | Discharge status: Home / Self Care | Payer: Self-pay | Source: Home / Self Care | Attending: Cardiology

## 2012-05-29 ENCOUNTER — Ambulatory Visit: Payer: 59

## 2012-05-29 ENCOUNTER — Ambulatory Visit (INDEPENDENT_AMBULATORY_CARE_PROVIDER_SITE_OTHER): Payer: 59 | Admitting: Emergency Medicine

## 2012-05-29 ENCOUNTER — Inpatient Hospital Stay (HOSPITAL_COMMUNITY)
Admission: EM | Admit: 2012-05-29 | Discharge: 2012-06-01 | DRG: 247 | Disposition: A | Discharge status: Home / Self Care | Payer: 59 | Attending: Cardiology | Admitting: Cardiology

## 2012-05-29 VITALS — BP 88/61 | HR 100 | Temp 98.3°F | Resp 18 | Ht 63.0 in | Wt 189.0 lb

## 2012-05-29 DIAGNOSIS — I2510 Atherosclerotic heart disease of native coronary artery without angina pectoris: Secondary | ICD-10-CM | POA: Diagnosis present

## 2012-05-29 DIAGNOSIS — T50Z95A Adverse effect of other vaccines and biological substances, initial encounter: Secondary | ICD-10-CM

## 2012-05-29 DIAGNOSIS — N390 Urinary tract infection, site not specified: Secondary | ICD-10-CM

## 2012-05-29 DIAGNOSIS — Z7982 Long term (current) use of aspirin: Secondary | ICD-10-CM

## 2012-05-29 DIAGNOSIS — E119 Type 2 diabetes mellitus without complications: Secondary | ICD-10-CM | POA: Diagnosis present

## 2012-05-29 DIAGNOSIS — E785 Hyperlipidemia, unspecified: Secondary | ICD-10-CM | POA: Diagnosis present

## 2012-05-29 DIAGNOSIS — Z9089 Acquired absence of other organs: Secondary | ICD-10-CM

## 2012-05-29 DIAGNOSIS — J019 Acute sinusitis, unspecified: Secondary | ICD-10-CM | POA: Diagnosis present

## 2012-05-29 DIAGNOSIS — I2119 ST elevation (STEMI) myocardial infarction involving other coronary artery of inferior wall: Principal | ICD-10-CM | POA: Diagnosis present

## 2012-05-29 DIAGNOSIS — Q8500 Neurofibromatosis, unspecified: Secondary | ICD-10-CM | POA: Diagnosis present

## 2012-05-29 DIAGNOSIS — Z888 Allergy status to other drugs, medicaments and biological substances status: Secondary | ICD-10-CM

## 2012-05-29 DIAGNOSIS — M549 Dorsalgia, unspecified: Secondary | ICD-10-CM

## 2012-05-29 DIAGNOSIS — E669 Obesity, unspecified: Secondary | ICD-10-CM | POA: Diagnosis present

## 2012-05-29 DIAGNOSIS — B029 Zoster without complications: Secondary | ICD-10-CM | POA: Diagnosis present

## 2012-05-29 DIAGNOSIS — Z90710 Acquired absence of both cervix and uterus: Secondary | ICD-10-CM

## 2012-05-29 DIAGNOSIS — I959 Hypotension, unspecified: Secondary | ICD-10-CM | POA: Diagnosis present

## 2012-05-29 DIAGNOSIS — R42 Dizziness and giddiness: Secondary | ICD-10-CM

## 2012-05-29 DIAGNOSIS — I213 ST elevation (STEMI) myocardial infarction of unspecified site: Secondary | ICD-10-CM | POA: Diagnosis present

## 2012-05-29 DIAGNOSIS — A498 Other bacterial infections of unspecified site: Secondary | ICD-10-CM | POA: Diagnosis present

## 2012-05-29 DIAGNOSIS — I10 Essential (primary) hypertension: Secondary | ICD-10-CM | POA: Diagnosis present

## 2012-05-29 DIAGNOSIS — Z885 Allergy status to narcotic agent status: Secondary | ICD-10-CM

## 2012-05-29 DIAGNOSIS — Z955 Presence of coronary angioplasty implant and graft: Secondary | ICD-10-CM

## 2012-05-29 DIAGNOSIS — R110 Nausea: Secondary | ICD-10-CM

## 2012-05-29 DIAGNOSIS — M1710 Unilateral primary osteoarthritis, unspecified knee: Secondary | ICD-10-CM | POA: Diagnosis present

## 2012-05-29 DIAGNOSIS — Z8249 Family history of ischemic heart disease and other diseases of the circulatory system: Secondary | ICD-10-CM

## 2012-05-29 HISTORY — DX: ST elevation (STEMI) myocardial infarction of unspecified site: I21.3

## 2012-05-29 HISTORY — DX: Urinary tract infection, site not specified: N39.0

## 2012-05-29 HISTORY — DX: Hyperlipidemia, unspecified: E78.5

## 2012-05-29 HISTORY — PX: LEFT HEART CATHETERIZATION WITH CORONARY ANGIOGRAM: SHX5451

## 2012-05-29 LAB — COMPREHENSIVE METABOLIC PANEL
ALT: 29 U/L (ref 0–35)
AST: 67 U/L — ABNORMAL HIGH (ref 0–37)
Albumin: 3.8 g/dL (ref 3.5–5.2)
Calcium: 9.6 mg/dL (ref 8.4–10.5)
Creatinine, Ser: 0.86 mg/dL (ref 0.50–1.10)
Sodium: 135 mEq/L (ref 135–145)
Total Protein: 7.7 g/dL (ref 6.0–8.3)

## 2012-05-29 LAB — DIFFERENTIAL
Basophils Absolute: 0 10*3/uL (ref 0.0–0.1)
Basophils Relative: 0 % (ref 0–1)
Eosinophils Relative: 0 % (ref 0–5)
Lymphocytes Relative: 14 % (ref 12–46)
Monocytes Absolute: 2 10*3/uL — ABNORMAL HIGH (ref 0.1–1.0)

## 2012-05-29 LAB — CBC
HCT: 45.4 % (ref 36.0–46.0)
MCHC: 34.1 g/dL (ref 30.0–36.0)
MCV: 84.1 fL (ref 78.0–100.0)
Platelets: 314 10*3/uL (ref 150–400)
RDW: 13.5 % (ref 11.5–15.5)
WBC: 15.7 10*3/uL — ABNORMAL HIGH (ref 4.0–10.5)

## 2012-05-29 LAB — CARDIAC PANEL(CRET KIN+CKTOT+MB+TROPI)
Relative Index: 8.8 — ABNORMAL HIGH (ref 0.0–2.5)
Troponin I: >25 ng/mL (ref <0.30)

## 2012-05-29 LAB — POCT UA - MICROSCOPIC ONLY
Bacteria, U Microscopic: NEGATIVE
Crystals, Ur, HPF, POC: NEGATIVE
Mucus, UA: NEGATIVE

## 2012-05-29 LAB — POCT URINALYSIS DIPSTICK
Ketones, UA: NEGATIVE
Spec Grav, UA: >=1.03
pH, UA: 5

## 2012-05-29 LAB — MRSA PCR SCREENING: MRSA by PCR: NEGATIVE

## 2012-05-29 LAB — POCT CBC
HCT, POC: 49.6 % — AB (ref 37.7–47.9)
Hemoglobin: 16.1 g/dL (ref 12.2–16.2)
MCH, POC: 28 pg (ref 27–31.2)
MCV: 86.4 fL (ref 80–97)
MID (cbc): 1.2 — AB (ref 0–0.9)
Platelet Count, POC: 432 10*3/uL — AB (ref 142–424)
RBC: 5.74 M/uL — AB (ref 4.04–5.48)
WBC: 16.4 10*3/uL — AB (ref 4.6–10.2)

## 2012-05-29 LAB — POCT I-STAT, CHEM 8
BUN: 20 mg/dL (ref 6–23)
Calcium, Ion: 1.11 mmol/L — ABNORMAL LOW (ref 1.12–1.32)
Calcium, Ion: 1.14 mmol/L (ref 1.12–1.32)
Chloride: 91 mEq/L — ABNORMAL LOW (ref 96–112)
Chloride: 98 mEq/L (ref 96–112)
Creatinine, Ser: 1 mg/dL (ref 0.50–1.10)
Glucose, Bld: 184 mg/dL — ABNORMAL HIGH (ref 70–99)
HCT: 42 % (ref 36.0–46.0)
HCT: 47 % — ABNORMAL HIGH (ref 36.0–46.0)
Potassium: 3.2 mEq/L — ABNORMAL LOW (ref 3.5–5.1)
Potassium: 4 mEq/L (ref 3.5–5.1)
Sodium: 130 mEq/L — ABNORMAL LOW (ref 135–145)

## 2012-05-29 LAB — POCT ACTIVATED CLOTTING TIME: Activated Clotting Time: 485 seconds

## 2012-05-29 LAB — POCT I-STAT TROPONIN I

## 2012-05-29 SURGERY — LEFT HEART CATHETERIZATION WITH CORONARY ANGIOGRAM
Anesthesia: LOCAL

## 2012-05-29 MED ORDER — ONDANSETRON HCL 4 MG/2ML IJ SOLN
INTRAMUSCULAR | Status: AC
  Administered 2012-05-29: 4 mg via INTRAVENOUS
  Filled 2012-05-29: qty 2

## 2012-05-29 MED ORDER — OMEGA-3-ACID ETHYL ESTERS 1 G PO CAPS
1.0000 g | ORAL_CAPSULE | Freq: Every day | ORAL | Status: DC
  Administered 2012-05-29 – 2012-06-01 (×4): 1 g via ORAL
  Filled 2012-05-29 (×4): qty 1

## 2012-05-29 MED ORDER — HEPARIN SODIUM (PORCINE) 1000 UNIT/ML IJ SOLN
INTRAMUSCULAR | Status: AC
  Filled 2012-05-29: qty 1

## 2012-05-29 MED ORDER — TICAGRELOR 90 MG PO TABS
90.0000 mg | ORAL_TABLET | Freq: Two times a day (BID) | ORAL | Status: DC
  Administered 2012-05-29 – 2012-06-01 (×6): 90 mg via ORAL
  Filled 2012-05-29 (×7): qty 1

## 2012-05-29 MED ORDER — OCUVITE PO TABS
1.0000 | ORAL_TABLET | Freq: Every day | ORAL | Status: DC
  Administered 2012-05-30 – 2012-06-01 (×3): 1 via ORAL
  Filled 2012-05-29 (×3): qty 1

## 2012-05-29 MED ORDER — ACETAMINOPHEN 325 MG PO TABS
650.0000 mg | ORAL_TABLET | ORAL | Status: DC | PRN
  Administered 2012-05-30 – 2012-05-31 (×2): 650 mg via ORAL
  Filled 2012-05-29 (×2): qty 2

## 2012-05-29 MED ORDER — CLOBETASOL PROPIONATE 0.05 % EX CREA
1.0000 "application " | TOPICAL_CREAM | Freq: Two times a day (BID) | CUTANEOUS | Status: DC
  Administered 2012-05-29 – 2012-06-01 (×5): 1 via TOPICAL
  Filled 2012-05-29: qty 15

## 2012-05-29 MED ORDER — ONDANSETRON HCL 4 MG/2ML IJ SOLN
4.0000 mg | Freq: Four times a day (QID) | INTRAMUSCULAR | Status: DC | PRN
  Administered 2012-05-29: 4 mg via INTRAVENOUS

## 2012-05-29 MED ORDER — LIDOCAINE HCL (PF) 1 % IJ SOLN
INTRAMUSCULAR | Status: AC
  Filled 2012-05-29: qty 30

## 2012-05-29 MED ORDER — ASPIRIN 81 MG PO CHEW
324.0000 mg | CHEWABLE_TABLET | Freq: Once | ORAL | Status: AC
  Administered 2012-05-29: 324 mg via ORAL

## 2012-05-29 MED ORDER — ONDANSETRON 8 MG PO TBDP: 8.0000 mg | ORAL_TABLET | Freq: Three times a day (TID) | ORAL | Status: DC | PRN

## 2012-05-29 MED ORDER — CEPHALEXIN 500 MG PO TABS: 500.0000 mg | ORAL_TABLET | Freq: Three times a day (TID) | ORAL | Status: DC

## 2012-05-29 MED ORDER — ONDANSETRON 4 MG PO TBDP
8.0000 mg | ORAL_TABLET | Freq: Once | ORAL | Status: AC
  Administered 2012-05-29: 8 mg via ORAL

## 2012-05-29 MED ORDER — SODIUM CHLORIDE 0.9 % IV SOLN
INTRAVENOUS | Status: DC
  Administered 2012-05-29 – 2012-05-31 (×3): via INTRAVENOUS

## 2012-05-29 MED ORDER — BIOTENE DRY MOUTH MT LIQD
15.0000 mL | Freq: Two times a day (BID) | OROMUCOSAL | Status: DC
  Administered 2012-05-30 – 2012-05-31 (×4): 15 mL via OROMUCOSAL

## 2012-05-29 MED ORDER — MORPHINE SULFATE 2 MG/ML IJ SOLN
1.0000 mg | INTRAMUSCULAR | Status: DC | PRN
  Administered 2012-05-30 (×2): 1 mg via INTRAVENOUS
  Filled 2012-05-29 (×2): qty 1

## 2012-05-29 MED ORDER — ASPIRIN 81 MG PO CHEW
CHEWABLE_TABLET | ORAL | Status: AC
  Administered 2012-05-29: 324 mg via ORAL
  Filled 2012-05-29: qty 4

## 2012-05-29 MED ORDER — LOSARTAN POTASSIUM-HCTZ 100-12.5 MG PO TABS: 1.0000 | ORAL_TABLET | Freq: Every day | ORAL | Status: DC

## 2012-05-29 MED ORDER — OMEGA-3 FATTY ACIDS 1000 MG PO CAPS: 1.0000 g | ORAL_CAPSULE | Freq: Every day | ORAL | Status: DC

## 2012-05-29 MED ORDER — ATROPINE SULFATE 1 MG/ML IJ SOLN
INTRAMUSCULAR | Status: AC
  Filled 2012-05-29: qty 1

## 2012-05-29 MED ORDER — TICAGRELOR 90 MG PO TABS
ORAL_TABLET | ORAL | Status: AC
  Administered 2012-05-29: 90 mg via ORAL
  Filled 2012-05-29: qty 2

## 2012-05-29 MED ORDER — CEFTRIAXONE SODIUM 1 G IJ SOLR
1.0000 g | Freq: Once | INTRAMUSCULAR | Status: AC
  Administered 2012-05-29: 1 g via INTRAMUSCULAR

## 2012-05-29 MED ORDER — NITROGLYCERIN 0.4 MG SL SUBL: 0.4000 mg | SUBLINGUAL_TABLET | SUBLINGUAL | Status: DC | PRN

## 2012-05-29 MED ORDER — MIDAZOLAM HCL 2 MG/2ML IJ SOLN
INTRAMUSCULAR | Status: AC
  Filled 2012-05-29: qty 2

## 2012-05-29 MED ORDER — GLIMEPIRIDE 4 MG PO TABS
4.0000 mg | ORAL_TABLET | Freq: Every day | ORAL | Status: DC
  Administered 2012-05-30 – 2012-06-01 (×3): 4 mg via ORAL
  Filled 2012-05-29 (×4): qty 1

## 2012-05-29 MED ORDER — POTASSIUM CHLORIDE CRYS ER 20 MEQ PO TBCR
40.0000 meq | EXTENDED_RELEASE_TABLET | Freq: Once | ORAL | Status: AC
  Administered 2012-05-29: 40 meq via ORAL
  Filled 2012-05-29: qty 2

## 2012-05-29 MED ORDER — HEPARIN (PORCINE) IN NACL 2-0.9 UNIT/ML-% IJ SOLN
INTRAMUSCULAR | Status: AC
  Filled 2012-05-29: qty 1000

## 2012-05-29 MED ORDER — ASPIRIN 81 MG PO CHEW
81.0000 mg | CHEWABLE_TABLET | Freq: Every day | ORAL | Status: DC
  Administered 2012-05-30 – 2012-06-01 (×3): 81 mg via ORAL
  Filled 2012-05-29 (×3): qty 1

## 2012-05-29 MED ORDER — BIVALIRUDIN 250 MG IV SOLR
INTRAVENOUS | Status: AC
  Filled 2012-05-29: qty 250

## 2012-05-29 MED ORDER — FENTANYL CITRATE 0.05 MG/ML IJ SOLN
INTRAMUSCULAR | Status: AC
  Filled 2012-05-29: qty 2

## 2012-05-29 MED ORDER — NITROGLYCERIN 0.2 MG/ML ON CALL CATH LAB
INTRAVENOUS | Status: AC
  Filled 2012-05-29: qty 1

## 2012-05-29 MED ORDER — LOSARTAN POTASSIUM 50 MG PO TABS
100.0000 mg | ORAL_TABLET | Freq: Every day | ORAL | Status: DC
  Filled 2012-05-29: qty 2

## 2012-05-29 MED ORDER — METOPROLOL SUCCINATE ER 25 MG PO TB24
25.0000 mg | ORAL_TABLET | Freq: Every day | ORAL | Status: DC
  Filled 2012-05-29 (×2): qty 1

## 2012-05-29 MED ORDER — HYDROCHLOROTHIAZIDE 12.5 MG PO CAPS
12.5000 mg | ORAL_CAPSULE | Freq: Every day | ORAL | Status: DC
  Filled 2012-05-29: qty 1

## 2012-05-29 MED ORDER — ADULT MULTIVITAMIN W/MINERALS CH
1.0000 | ORAL_TABLET | Freq: Every day | ORAL | Status: DC
  Administered 2012-05-29 – 2012-06-01 (×4): 1 via ORAL
  Filled 2012-05-29 (×4): qty 1

## 2012-05-29 NOTE — ED Notes (Signed)
ED Doctor at bedside.  

## 2012-05-29 NOTE — Progress Notes (Signed)
Subjective:    Patient ID: [name], female    DOB: 10-28-1946, 66 y.o.   MRN: [id number]  HPI patient states she had her shingles vaccine last Tuesday. On Wednesday she started feeling bad. She has been nauseated at least for a week. She has had pain at the site of her vaccination. Last night she had the onset of burning on urination. This has persisted through the day and she also has pain in the left side of her back which she says is been present for at least a week. She has no history of pyelonephritis but does have a history of a previous urinary tract infection. She feels nauseated but has not vomited. The major part of her dysuria started this morning. She does hurt on her left side when she takes a breath but she has not had a cough and she has not coughed up any blood. She has not had any calf pain or tenderness    Review of Systems the patient overall is in good health. She has a history of neurofibromatosis and history of diabetes     Objective:   Physical Exam  Constitutional:       Patient looks like she does not feel well. She does not look in any distress.  HENT:  Head: Normocephalic and atraumatic.  Eyes: Pupils are equal, round, and reactive to light.  Neck: No thyromegaly present.  Cardiovascular: Normal rate and regular rhythm.   Pulmonary/Chest: No respiratory distress. She has no wheezes. She has no rales. She exhibits no tenderness.  Abdominal: She exhibits no distension. There is no tenderness. There is no rebound and no guarding.   Results for orders placed in visit on 05/29/12  POCT CBC      Component Value Range   WBC 16.4 (*) 4.6 - 10.2 K/uL   Lymph, poc 2.8  0.6 - 3.4   POC LYMPH PERCENT 17.2  10 - 50 %L   MID (cbc) 1.2 (*) 0 - 0.9   POC MID % 7.2  0 - 12 %M   POC Granulocyte 12.4 (*) 2 - 6.9   Granulocyte percent 75.6  37 - 80 %G   RBC 5.74 (*) 4.04 - 5.48 M/uL   Hemoglobin 16.1  12.2 - 16.2 g/dL   HCT, POC 40.9 (*) 81.1 - 47.9 %   MCV 86.4  80  - 97 fL   MCH, POC 28.0  27 - 31.2 pg   MCHC 32.5  31.8 - 35.4 g/dL   RDW, POC 91.4     Platelet Count, POC 432 (*) 142 - 424 K/uL   MPV 9.5  0 - 99.8 fL  GLUCOSE, POCT (MANUAL RESULT ENTRY)      Component Value Range   POC Glucose 153 (*) 70 - 99 mg/dl   Results for orders placed in visit on 05/29/12  POCT CBC      Component Value Range   WBC 16.4 (*) 4.6 - 10.2 K/uL   Lymph, poc 2.8  0.6 - 3.4   POC LYMPH PERCENT 17.2  10 - 50 %L   MID (cbc) 1.2 (*) 0 - 0.9   POC MID % 7.2  0 - 12 %M   POC Granulocyte 12.4 (*) 2 - 6.9   Granulocyte percent 75.6  37 - 80 %G   RBC 5.74 (*) 4.04 - 5.48 M/uL   Hemoglobin 16.1  12.2 - 16.2 g/dL   HCT, POC 78.2 (*) 95.6 - 47.9 %   MCV  86.4  80 - 97 fL   MCH, POC 28.0  27 - 31.2 pg   MCHC 32.5  31.8 - 35.4 g/dL   RDW, POC 16.1     Platelet Count, POC 432 (*) 142 - 424 K/uL   MPV 9.5  0 - 99.8 fL  GLUCOSE, POCT (MANUAL RESULT ENTRY)      Component Value Range   POC Glucose 153 (*) 70 - 99 mg/dl  POCT UA - MICROSCOPIC ONLY      Component Value Range   WBC, Ur, HPF, POC 2-5     RBC, urine, microscopic 0-1     Bacteria, U Microscopic neg     Mucus, UA neg     Epithelial cells, urine per micros 0-2     Crystals, Ur, HPF, POC neg     Casts, Ur, LPF, POC neg     Yeast, UA neg    POCT URINALYSIS DIPSTICK      Component Value Range   Color, UA yellow     Clarity, UA clear     Glucose, UA neg     Bilirubin, UA small     Ketones, UA neg     Spec Grav, UA >=1.030     Blood, UA trace-lysed     pH, UA 5.0     Protein, UA 30mg      Urobilinogen, UA 0.2     Nitrite, UA neg     Leukocytes, UA Trace       [facility] reading (PRIMARY) by  Dr.Ercel Normoyle NAD      Assessment & Plan:  It is unclear to me what is going on with Mrs. Mccallum. She is definitely orthostatic here.She did not have a fever but does have a significant rise in her blood count. This could be a the left kidney infection or could be related to her shingles vaccine but with her hypotension  I felt observation and IV fluids would be indicated. As noted she did receive 1 g of Rocephin here and a urine culture was done prior to transport. Also of note is the patient's pulse ox varied between 87 and 90. She definitely has flank pain and there is some increase with inspiration raising the question of a pulmonary embolus. She has bilateral significant varicosities but no calf swelling or tenderness at the present present time but a PE is definitely is in the differential

## 2012-05-29 NOTE — CV Procedure (Signed)
PROCEDURE:  Left heart catheterization with selective coronary angiography, left ventriculogram.  PCI RCA. Aspiration thrombectomy  INDICATIONS:  Inferior STEMI  The risks, benefits, and details of the procedure were explained to the patient.  The patient verbalized understanding and wanted to proceed.  Informed written consent was obtained.  PROCEDURE TECHNIQUE:  After Xylocaine anesthesia a 59F sheath was placed in the right femoral artery with a single anterior needle wall stick.   Left coronary angiography was done using a Judkins L4 guide catheter.  Right coronary angiography was done using a Judkins R4 guide catheter.  PCI was performed. Left ventriculography was done using a pigtail catheter.  Right femoral angiography was performed of the sheath placement was not suitable for closure device.  Hemostasis will be with manual compression.   CONTRAST:  Total of 115 cc.  COMPLICATIONS:  None.    HEMODYNAMICS:  Aortic pressure was 91/56; LV pressure was 93/5; LVEDP 14.  There was no gradient between the left ventricle and aorta.    ANGIOGRAPHIC DATA:   The left main coronary artery is short and widely patent.  The left anterior descending artery is a large vessel proximally.  It terminates before the apex.  There are two large diagonals. The third diagonal is medium sized.   The LAD system is widely patent.  Left to right collaterals were noted  The left circumflex artery is a large vessel which has mild proximal disease.  There is a small OM1 and a large branching OM 2, both of which appear angiographically normal.  The right coronary artery is a large dominant vessel.  There is a mid vessel occlusion.  After intervention, it was revealed that the RCA was large.  There was a large branch PL artery and a medium sized PDA.  Mild irregularities were noted in the distal RCA.  LEFT VENTRICULOGRAM:  Left ventricular angiogram was done in the 30 RAO projection and revealed  Mild basal inferior  hypokinesis and overall normal systolic function with an estimated ejection fraction of 55%.  LVEDP was 14 mmHg.  PCI NARRATIVE: JR 4 Guide with prowater wire.  Angiomax was used for anticoagulation.  ACT was used to confirm Angiomax is therapeutic.  Several doses of IC NTG were given to relieve vasospasm.  A pro-water wire was placed across the occlusion.  An expressway aspiration catheter was used to remove thrombus.  TIMI-3 flow was restored after this device.  A 2.5 x 12 emerge balloon was used to predilate the lesion.  A 3.5 x 16 Promus drug-eluting stent was then deployed at 16 atmospheres in the mid right coronary artery.  The stent was postdilated with a 4.0 x 12 [place] Quantum apex balloon inflated to 16 atmospheres.  There is a Angiographic result with no residual stenosis.  Lesion length was 12 mm.  Initial TIMI 0 flow was restored to TIMI 3 flow.  IMPRESSIONS:  1. No significant coronary artery disease in the left system. 2. 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  This was successfully treated with 3.5 x 16 Promus, drug-eluting stent post dilated to greater than 4 mm in diameter 3. Normal left ventricular systolic function.  LVEDP 14 mmHg.  Ejection fraction 55 %.  RECOMMENDATION:  She should have dual antiplatelet therapy for at least a year.  We'll watch in the CCU.  Cycle cardiac enzymes.  She is intolerant of statins.  Continue beta blocker, ACE inhibitor, aspirin, brilinta.

## 2012-05-29 NOTE — ED Notes (Signed)
PER EMS: patient presents from Dr. Lynder Parents office for further evaluation of  Hypotension, flank pain, elevated white count.  Patient reporting dizziness and is hypotensive.

## 2012-05-29 NOTE — ED Notes (Signed)
Cardiology at bedside.  Stated to hold heparin only give aspirin.  Family members at bedside.

## 2012-05-29 NOTE — Progress Notes (Signed)
Right groin arterial sheath discontinued @ 2028  per protocol.  Manual pressure held for 20 minutes. No bleeding or hematoma noted.  Sterile dressing applied.  Patient tolerated procedure well.  Instructed on post sheath removal restrictions ; with understanding verbalized by patient.

## 2012-05-29 NOTE — ED Provider Notes (Signed)
History     CSN: [id number]  Arrival date & time 05/29/12  1705   First MD Initiated Contact with Patient 05/29/12 1719    Level V caveat due to urgent need for intervention.  Chief Complaint  Patient presents with  . Hypotension  . Flank Pain    (Consider location/radiation/quality/duration/timing/severity/associated sxs/prior treatment) Patient is a 66 y.o. female presenting with flank pain. The history is provided by the patient.  Flank Pain   patient was sent to the ER from Dr. Ellis Parents office. She came in because she's had urinary frequency left flank pain and elevated white count. Patient then dizzy and was hypotensive with pressure that dipped to the 80s there. She did have a pulse ox also was lower. She states she's had felt bad for last week. No cough. She states that she's had upper back pain/left flank pain the last week. She continues to have pain.  Past Medical History  Diagnosis Date  . Neurofibromatosis   . Diabetes mellitus   . HTN (hypertension)   . Obesity   . Osteoarthritis, knee     Past Surgical History  Procedure Date  . Cardiac catheterization   . Tonsillectomy and adenoidectomy   . Clavicle surgery     Family History  Problem Relation Age of Onset  . Heart disease Mother   . Arthritis Mother     History  Substance Use Topics  . Smoking status: Never Smoker   . Smokeless tobacco: Not on file  . Alcohol Use: Not on file    OB History    Grav Para Term Preterm Abortions TAB SAB Ect Mult Living                  Review of Systems  Unable to perform ROS Constitutional: Positive for chills and fatigue. Negative for fever.  Gastrointestinal: Negative for blood in stool.  Genitourinary: Positive for dysuria and flank pain.  Neurological: Positive for dizziness.    Allergies  Amaryl; Codeine; Crestor; Lipitor; Lovastatin; Niaspan; Sumycin; Zetia; Zithromax; Zocor; and Zocor  Home Medications  No current outpatient prescriptions on  file.  BP 101/52  Pulse 106  Temp 98.2 F (36.8 C) (Oral)  Resp 16  Ht 5\' 3"  (1.6 m)  Wt 189 lb (85.73 kg)  BMI 33.48 kg/m2  SpO2 94%  Physical Exam  Constitutional: She is oriented to person, place, and time. She appears well-developed and well-nourished.  HENT:  Head: Normocephalic.  Eyes: Pupils are equal, round, and reactive to light.  Neck: Normal range of motion.  Cardiovascular: Normal rate.   Pulmonary/Chest: Effort normal and breath sounds normal.  Abdominal: Soft. There is no tenderness.  Genitourinary:       No CVA tenderness  Musculoskeletal:       Equal and bilateral radial pulses.  Neurological: She is alert and oriented to person, place, and time.  Skin: No erythema.    ED Course  Procedures (including critical care time)   Labs Reviewed  CBC  DIFFERENTIAL  COMPREHENSIVE METABOLIC PANEL   Dg Chest 2 View  05/29/2012  *RADIOLOGY REPORT*  Clinical Data: Dizziness.  CHEST - 2 VIEW  Comparison: Chest 11/15/2011.  Findings: Lungs are clear.  Heart size is normal.  No pneumothorax or pleural fluid.  IMPRESSION: Negative chest.  Clinically significant discrepancy from primary report, if provided: None  Original Report Authenticated By: Bernadene Bell. D'ALESSIO, M.D.     1. STEMI (ST elevation myocardial infarction)     Date: 05/29/2012  Rate: 98  Rhythm: normal sinus rhythm  QRS Axis: normal  Intervals: normal  ST/T Wave abnormalities: ST elevations inferiorly and ST elevations laterally  Conduction Disutrbances:none  Narrative Interpretation: New ST elevation laterally with also elevation inferiorly. There is ST depression in aVL.  Old EKG Reviewed: changes noted     MDM  Patient with flank pain fatigue. White count was elevated at 16 a primary care doctor's office. Suspicion was for a pyelonephritis, but she does not have an infection the urine there. No cough. She did have some hypotension. Initial EKG showed ST elevation. She did not have chest pain,  but has had back pain for last week. Code STEMI was called and patient was taken to the Cath Lab by Dr. Eldridge Dace.        Juliet Rude. Rubin Payor, MD 05/29/12 1745

## 2012-05-29 NOTE — H&P (Signed)
Admit date: 05/29/2012 Referring Physician Dr. Rubin Payor Primary Cardiologist Dr. Patty Sermons Chief complaint/reason for admission: Nausea, dizziness, abnormal ECG  HPI: 22 with diabetes who has had intermittent nausea, dizziness and weakness over the past 5-6 days.  She would have to lie down because she felt very weak.  The symptoms would go away on her own.  She did not report pain in her chest.  She did have intermittent pain in her back.  Today the symptoms got significantly worse and she came to urgent care.  Due to her symptoms, she was sent to the emergency room and ECG there showed inferior ST elevation.  This was a change from her prior ECG.  We are asked to further evaluate.    PMH:    Past Medical History  Diagnosis Date  . Neurofibromatosis   . Diabetes mellitus   . HTN (hypertension)   . Obesity   . Osteoarthritis, knee     PSH:    Past Surgical History  Procedure Date  . Cardiac catheterization   . Tonsillectomy and adenoidectomy   . Clavicle surgery   . Abdominal hysterectomy     ALLERGIES:   Amaryl; Codeine; Crestor; Lipitor; Lovastatin; Niaspan; Sumycin; Zetia; Zithromax; Zocor; and Zocor  Prior to Admit Meds:   Prescriptions prior to admission  Medication Sig Dispense Refill  . aspirin 325 MG tablet Take 325 mg by mouth daily.       . Calcium Carbonate-Vitamin D (CALCIUM + D PO) Take by mouth daily.        Marland Kitchen CINNAMON PO Take 1 capsule by mouth daily. OCCASIONALLY      . clobetasol cream (TEMOVATE) 0.05 % Apply 1 application topically 2 (two) times daily.       . fish oil-omega-3 fatty acids 1000 MG capsule Take 1 g by mouth daily.       Marland Kitchen glimepiride (AMARYL) 2 MG tablet Take 4 mg by mouth daily before breakfast.      . ibuprofen (ADVIL,MOTRIN) 200 MG tablet Take 200 mg by mouth every 8 (eight) hours as needed. For pain      . losartan-hydrochlorothiazide (HYZAAR) 100-12.5 MG per tablet Take 1 tablet by mouth daily.      . metFORMIN (GLUCOPHAGE) 1000 MG tablet  Take 1,000 mg by mouth 2 (two) times daily.      . metoprolol succinate (TOPROL-XL) 25 MG 24 hr tablet Take 25 mg by mouth daily.      . Multiple Vitamin (MULTIVITAMIN) tablet Take 1 tablet by mouth daily.       . Multiple Vitamins-Minerals (OCUVITE PO) Take by mouth daily.        . nitroGLYCERIN (NITROSTAT) 0.4 MG SL tablet Place 0.4 mg under the tongue every 5 (five) minutes as needed. Chest pain       Family HX:    Family History  Problem Relation Age of Onset  . Heart disease Mother   . Arthritis Mother    Social HX:    History   Social History  . Marital Status: Married    Spouse Name: N/A    Number of Children: N/A  . Years of Education: N/A   Occupational History  . Not on file.   Social History Main Topics  . Smoking status: Never Smoker   . Smokeless tobacco: Never Used  . Alcohol Use: No  . Drug Use: Not on file  . Sexually Active: Not Currently   Other Topics Concern  . Not on file  Social History Narrative  . No narrative on file     ROS:  All 11 ROS were addressed and are negative except what is stated in the HPI  PHYSICAL EXAM Filed Vitals:   05/29/12 1845  BP: 92/52  Pulse: 80  Temp:   Resp: 22   General: Well developed, well nourished, in no acute distress Head:  Normal cephalic and atramatic  Lungs:  No wheezing. Heart:   HRRR S1 S2  Abdomen:  abdomen soft and non-tender Msk:   Normal strength and tone for age. Extremities:  No  edema.  2+ PT, 3+ right femoral Neuro: Alert and oriented X 3. Psych:  Normal affect, responds appropriately   Labs:   Lab Results  Component Value Date   WBC 15.7* 05/29/2012   HGB 14.3 05/29/2012   HCT 42.0 05/29/2012   MCV 84.1 05/29/2012   PLT 314 05/29/2012    Lab 05/29/12 1801 05/29/12 1728  NA 130* --  K 3.2* --  CL 91* --  CO2 -- 25  BUN 19 --  CREATININE 0.70 --  CALCIUM -- 9.6  PROT -- 7.7  BILITOT -- 1.0  ALKPHOS -- 72  ALT -- 29  AST -- 67*  GLUCOSE 170* --   Lab Results  Component  Value Date   CKTOTAL 46 02/11/2009   CKMB 1.8 02/11/2009   TROPONINI  Value: 0.23        PERSISTENTLY INCREASED TROPONIN VALUES IN THE RANGE OF 0.06-0.49 ng/mL CAN BE SEEN IN:       -UNSTABLE ANGINA       -CONGESTIVE HEART FAILURE       -MYOCARDITIS       -CHEST TRAUMA       -ARRYHTHMIAS       -LATE PRESENTING MI       -COPD   CLINICAL FOLLOW-UP RECOMMENDED.* 02/11/2009   No results found for this basename: PTT   Lab Results  Component Value Date   INR 1.03 10/04/2009   INR 1.0 02/11/2009   INR 1.0 02/11/2009     Lab Results  Component Value Date   CHOL 213* 05/21/2012   CHOL 223* 03/11/2012   CHOL 217* 11/15/2011   Lab Results  Component Value Date   HDL 44 05/21/2012   HDL 44.30 03/11/2012   HDL 96.04 11/15/2011   Lab Results  Component Value Date   LDLCALC 140* 05/21/2012   LDLCALC 163* 03/10/2011   LDLCALC 177 11/08/2010   Lab Results  Component Value Date   TRIG 147 05/21/2012   TRIG 97.0 03/11/2012   TRIG 92.0 11/15/2011   Lab Results  Component Value Date   CHOLHDL 4.8 05/21/2012   CHOLHDL 5 03/11/2012   CHOLHDL 5 11/15/2011   Lab Results  Component Value Date   LDLDIRECT 174.3 03/11/2012   LDLDIRECT 161.2 11/15/2011   LDLDIRECT 160.6 07/17/2011      Radiology:  @[id number] @  EKG:  Normal sinus rhythm with inferior and lateral ST elevation  ASSESSMENT: Acute inferolateral MI  PLAN:  She will be brought emergently to the cath lab.  She will likely receive a stent and need dual antiplatelet therapy.  She is intolerant of statins.  Consider checking vitamin D level to see if vitamin D supplementation may help make statins more therapeutic.  Consider using CoQ 10 as well.  Resume beta blocker and ACE inhibitor as well.  She will likely be watched in the CCU.  Further plans based on the cardiac cath results.  The plan was discussed with the patient and her son.  They're in agreement.  Corky Crafts., MD  05/29/2012  7:46 PM

## 2012-05-29 NOTE — Progress Notes (Signed)
05/29/12 1902  Clinical Encounter Type  Visited With Patient and family together  Visit Type Follow-up;Spiritual support  Spiritual Encounters  Spiritual Needs Prayer  Advance Directives (For Healthcare)  Advance Directive Patient does not have advance directive;Patient would not like information  Pre-existing out of facility DNR order (yellow form or pink MOST form) No    Saw that Ms Tech had been moved to a room and followed up to check on family.  Spoke directly with pt, who stated that she is feeling somewhat better.   Per pt request, provided prayer with family at bedside.  Provided pastoral presence and encouragement.  Will refer to day chaplain for follow-up care.  [address] [place], [place] [phone]

## 2012-05-29 NOTE — Progress Notes (Signed)
CRITICAL VALUE ALERT  Critical value received:  Positive cardiac enzymes  Date of notification:  05/29/12  Time of notification:  2107  Critical value read back:yes  Nurse who received alert:  Ruffin Pyo  MD notified (1st page):  MD not paged; patient with known MI  Time of first page:    MD notified (2nd page):  Time of second page:  Responding MD:    Time MD responded:

## 2012-05-29 NOTE — Progress Notes (Signed)
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  Per son, family has a history of heart trouble.  Family was relatively calm and unstressed while waiting.  Provided pastoral presence and offer of ongoing spiritual and emotional care through pt's stay.  Plan to check in with family as pt gets settled on unit and will refer to day chaplain for follow-up care.  [address] [place], [place] [phone]

## 2012-05-29 NOTE — ED Notes (Signed)
Transported patient with cath lab on cardiac monitor zoll.

## 2012-05-30 ENCOUNTER — Inpatient Hospital Stay (HOSPITAL_COMMUNITY): Payer: 59

## 2012-05-30 DIAGNOSIS — I219 Acute myocardial infarction, unspecified: Secondary | ICD-10-CM

## 2012-05-30 DIAGNOSIS — I213 ST elevation (STEMI) myocardial infarction of unspecified site: Secondary | ICD-10-CM | POA: Diagnosis present

## 2012-05-30 LAB — CARDIAC PANEL(CRET KIN+CKTOT+MB+TROPI)
CK, MB: 30.1 ng/mL (ref 0.3–4.0)
CK, MB: 15.5 ng/mL (ref 0.3–4.0)
Relative Index: 7.1 — ABNORMAL HIGH (ref 0.0–2.5)
Troponin I: 14.04 ng/mL (ref <0.30)

## 2012-05-30 LAB — BASIC METABOLIC PANEL
BUN: 14 mg/dL (ref 6–23)
CO2: 25 mEq/L (ref 19–32)
Chloride: 101 mEq/L (ref 96–112)
GFR calc Af Amer: >90 mL/min (ref >90)
Potassium: 3.7 mEq/L (ref 3.5–5.1)

## 2012-05-30 LAB — CBC
HCT: 37.7 % (ref 36.0–46.0)
Platelets: 220 10*3/uL (ref 150–400)
RBC: 4.48 MIL/uL (ref 3.87–5.11)
RDW: 13.4 % (ref 11.5–15.5)
WBC: 10.9 10*3/uL — ABNORMAL HIGH (ref 4.0–10.5)

## 2012-05-30 LAB — GLUCOSE, CAPILLARY
Glucose-Capillary: 155 mg/dL — ABNORMAL HIGH (ref 70–99)
Glucose-Capillary: 166 mg/dL — ABNORMAL HIGH (ref 70–99)

## 2012-05-30 LAB — HEMOGLOBIN A1C: Mean Plasma Glucose: 154 mg/dL — ABNORMAL HIGH (ref <117)

## 2012-05-30 MED ORDER — LOSARTAN POTASSIUM 25 MG PO TABS
25.0000 mg | ORAL_TABLET | Freq: Every day | ORAL | Status: DC
  Administered 2012-06-01: 25 mg via ORAL
  Filled 2012-05-30 (×3): qty 1

## 2012-05-30 MED FILL — Dextrose Inj 5%: INTRAVENOUS | Qty: 50 | Status: AC

## 2012-05-30 NOTE — Progress Notes (Signed)
  Echocardiogram 2D Echocardiogram has been performed.  Laural Benes, Kacen Mellinger A 05/30/2012, 11:02 AM

## 2012-05-30 NOTE — Progress Notes (Signed)
Subjective:  Feels okay this am.  No nausea. No chest pain or dyspnea. BP low overnight requiring 250 ml saline bolus twice.  Objective:  Vital Signs in the last 24 hours: Temp:  [98.2 F (36.8 C)-98.7 F (37.1 C)] 98.7 F (37.1 C) (06/13 0800) Pulse Rate:  [80-106] 103  (06/13 0900) Resp:  [14-29] 16  (06/13 0900) BP: (72-107)/(13-70) 76/42 mmHg (06/13 0900) SpO2:  [87 %-100 %] 96 % (06/13 0900) FiO2 (%):  [2 %] 2 % (06/12 1850) Weight:  [85.73 kg (189 lb)] 85.73 kg (189 lb) (06/12 1726)  Intake/Output from previous day: 06/12 0701 - 06/13 0700 In: 1850 [P.O.:600; I.V.:1250] Out: 1000 [Urine:1000] Intake/Output from this shift: Total I/O In: 150 [I.V.:150] Out: -      . antiseptic oral rinse  15 mL Mouth Rinse BID  . aspirin  324 mg Oral Once  . aspirin  81 mg Oral Daily  . beta carotene w/minerals  1 tablet Oral Daily  . bivalirudin      . clobetasol cream  1 application Topical BID  . fentaNYL      . glimepiride  4 mg Oral QAC breakfast  . heparin      . heparin      . lidocaine      . losartan  25 mg Oral Daily  . metoprolol succinate  25 mg Oral Daily  . midazolam      . multivitamin with minerals  1 tablet Oral Daily  . nitroGLYCERIN      . omega-3 acid ethyl esters  1 g Oral Daily  . potassium chloride  40 mEq Oral Once  . Ticagrelor  90 mg Oral BID  . DISCONTD: fish oil-omega-3 fatty acids  1 g Oral Daily  . DISCONTD: hydrochlorothiazide  12.5 mg Oral Daily  . DISCONTD: losartan  100 mg Oral Daily  . DISCONTD: losartan-hydrochlorothiazide  1 tablet Oral Daily      . sodium chloride 75 mL/hr at 05/30/12 1610    Physical Exam: The patient appears to be in no distress.  Head and neck exam reveals that the pupils are equal and reactive.  The extraocular movements are full.  There is no scleral icterus.  Mouth and pharynx are benign.  No lymphadenopathy.  No carotid bruits.  The jugular venous pressure is normal.  Thyroid is not enlarged or  tender.  Chest is clear to percussion and auscultation.  No rales or rhonchi.  Expansion of the chest is symmetrical.  Heart reveals no abnormal lift or heave.  First and second heart sounds are normal.  There is no murmur gallop rub or click.  The abdomen is soft and nontender.  Bowel sounds are normoactive.  There is no hepatosplenomegaly or mass.  There are no abdominal bruits. Right groin okay.  Extremities reveal no phlebitis or edema.  Pedal pulses are good.  There is no cyanosis or clubbing.  Neurologic exam is normal strength and no lateralizing weakness.  No sensory deficits.  Integument reveals no rash.  Skin lesions of neurofibromatosis present.  Lab Results:  Basename 05/30/12 0300 05/29/12 1801 05/29/12 1728  WBC 10.9* -- 15.7*  HGB 12.8 14.3 --  PLT 220 -- 314    Basename 05/30/12 0300 05/29/12 1801 05/29/12 1728  NA 136 130* --  K 3.7 3.2* --  CL 101 91* --  CO2 25 -- 25  GLUCOSE 143* 170* --  BUN 14 19 --  CREATININE 0.76 0.70 --  Basename 05/30/12 0307 05/29/12 2017  TROPONINI 14.04* >25.00*   Hepatic Function Panel  Basename 05/29/12 1728  PROT 7.7  ALBUMIN 3.8  AST 67*  ALT 29  ALKPHOS 72  BILITOT 1.0  BILIDIR --  IBILI --   No results found for this basename: CHOL in the last 72 hours No results found for this basename: PROTIME in the last 72 hours  Imaging: Dg Chest 2 View  05/29/2012  *RADIOLOGY REPORT*  Clinical Data: Dizziness.  CHEST - 2 VIEW  Comparison: Chest 11/15/2011.  Findings: Lungs are clear.  Heart size is normal.  No pneumothorax or pleural fluid.  IMPRESSION: Negative chest.  Clinically significant discrepancy from primary report, if provided: None  Original Report Authenticated By: Bernadene Bell. Maricela Curet, M.D.    Cardiac Studies: Telemetry shows sinus tachycardia.  EKG shows persistent mild elevation ST in inferior leads. Assessment/Plan:  Patient Active Hospital Problem List: 1. 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 Hypotension post-MI.  Prior history of hypertension. 3. Diabetes mellitus 4. Neurofibromatosis. 5. Dyslipidemia.  Plan: Reduce/hold BP meds. Evaluate for RV infarct with echo. Chest xray today.  Check lipid panel and A1C. Trial of up in chair today. Tolerating her low BP without symptoms at this time.   LOS: 1 day    Cassell Clement 05/30/2012, 9:47 AM

## 2012-05-30 NOTE — Progress Notes (Signed)
Patient's BP dropped to 79/37.  Patient asymptomatic with skin warm & dry; denies dizziness, chest pain or SOB.  IVF bolus of NS given with no improvement noted. Cardiology paged and made aware of patient's status.  Instructed by Dr Shirlee Latch to continue monitoring as long as patient remains asymptomatic.

## 2012-05-30 NOTE — Progress Notes (Signed)
Discussed with pt MI, stent, chol, and CRPII. Requests her name be sent to [facility]. Will fu am [phone] Ethelda Chick CES, ACSM

## 2012-05-30 NOTE — Care Management Note (Signed)
    Page 1 of 1   05/30/2012     9:13:27 AM   CARE MANAGEMENT NOTE 05/30/2012  Patient:  [name], [name]   Account Number:  [id number]  Date Initiated:  05/30/2012  Documentation initiated by:  Junius Creamer  Subjective/Objective Assessment:   adm w mi     Action/Plan:   lives w husband   Anticipated DC Date:     Anticipated DC Plan:        DC Planning Services  CM consult  Medication Assistance      Choice offered to / List presented to:             Status of service:   Medicare Important Message given?   (If response is "NO", the following Medicare IM given date fields will be blank) Date Medicare IM given:   Date Additional Medicare IM given:    Discharge Disposition:    Per UR Regulation:  Reviewed for med. necessity/level of care/duration of stay  If discussed at Long Length of Stay Meetings, dates discussed:    Comments:  6/13 9:12a debbie Tommy Minichiello rn,bsn [phone] having cm sec ck on copay for brilintal.

## 2012-05-31 LAB — GLUCOSE, CAPILLARY
Glucose-Capillary: 140 mg/dL — ABNORMAL HIGH (ref 70–99)
Glucose-Capillary: 105 mg/dL — ABNORMAL HIGH (ref 70–99)

## 2012-05-31 LAB — URINE CULTURE

## 2012-05-31 LAB — LIPID PANEL
Cholesterol: 137 mg/dL (ref 0–200)
Triglycerides: 60 mg/dL (ref <150)

## 2012-05-31 MED ORDER — SODIUM CHLORIDE 0.9 % IJ SOLN
3.0000 mL | Freq: Two times a day (BID) | INTRAMUSCULAR | Status: DC
  Administered 2012-05-31 – 2012-06-01 (×3): 3 mL via INTRAVENOUS

## 2012-05-31 MED ORDER — SODIUM CHLORIDE 0.9 % IJ SOLN: 3.0000 mL | INTRAMUSCULAR | Status: DC | PRN

## 2012-05-31 MED ORDER — SODIUM CHLORIDE 0.9 % IV SOLN: 250.0000 mL | INTRAVENOUS | Status: DC | PRN

## 2012-05-31 MED ORDER — METOPROLOL SUCCINATE 12.5 MG HALF TABLET
12.5000 mg | ORAL_TABLET | Freq: Every day | ORAL | Status: DC
  Administered 2012-06-01: 12.5 mg via ORAL
  Filled 2012-05-31 (×2): qty 1

## 2012-05-31 NOTE — Progress Notes (Signed)
Subjective:  BP remains low but no symptoms.  Will try to restart Toprol 12.5 mg today. 2D echo shows good LV function.  Objective:  Vital Signs in the last 24 hours: Temp:  [98.6 F (37 C)-99.3 F (37.4 C)] 99.2 F (37.3 C) (06/14 0748) Pulse Rate:  [45-110] 101  (06/14 0713) Resp:  [9-25] 20  (06/14 0713) BP: (74-96)/(27-62) 94/52 mmHg (06/14 0713) SpO2:  [90 %-99 %] 93 % (06/14 0713)  Intake/Output from previous day: 06/13 0701 - 06/14 0700 In: 2240 [P.O.:440; I.V.:1800] Out: 3150 [Urine:3150] Intake/Output from this shift:       . antiseptic oral rinse  15 mL Mouth Rinse BID  . aspirin  81 mg Oral Daily  . beta carotene w/minerals  1 tablet Oral Daily  . clobetasol cream  1 application Topical BID  . glimepiride  4 mg Oral QAC breakfast  . losartan  25 mg Oral Daily  . metoprolol succinate  12.5 mg Oral Daily  . multivitamin with minerals  1 tablet Oral Daily  . omega-3 acid ethyl esters  1 g Oral Daily  . sodium chloride  3 mL Intravenous Q12H  . Ticagrelor  90 mg Oral BID  . DISCONTD: hydrochlorothiazide  12.5 mg Oral Daily  . DISCONTD: losartan  100 mg Oral Daily  . DISCONTD: metoprolol succinate  25 mg Oral Daily      . DISCONTD: sodium chloride 75 mL/hr at 05/31/12 0025    Physical Exam: The patient appears to be in no distress.  Head and neck exam reveals that the pupils are equal and reactive.  The extraocular movements are full.  There is no scleral icterus.  Mouth and pharynx are benign.  No lymphadenopathy.  No carotid bruits.  The jugular venous pressure is normal.  Thyroid is not enlarged or tender.  Chest is clear to percussion and auscultation.  No rales or rhonchi.  Expansion of the chest is symmetrical.  Heart reveals no abnormal lift or heave.  First and second heart sounds are normal.  There is no murmur gallop rub or click.  The abdomen is soft and nontender.  Bowel sounds are normoactive.  There is no hepatosplenomegaly or mass.  There  are no abdominal bruits. Right groin okay.  Extremities reveal no phlebitis or edema.  Pedal pulses are good.  There is no cyanosis or clubbing.  Neurologic exam is normal strength and no lateralizing weakness.  No sensory deficits.  Integument reveals no rash.  Skin lesions of neurofibromatosis present.  Lab Results:  Basename 05/30/12 0300 05/29/12 1801 05/29/12 1728  WBC 10.9* -- 15.7*  HGB 12.8 14.3 --  PLT 220 -- 314    Basename 05/30/12 0300 05/29/12 1801 05/29/12 1728  NA 136 130* --  K 3.7 3.2* --  CL 101 91* --  CO2 25 -- 25  GLUCOSE 143* 170* --  BUN 14 19 --  CREATININE 0.76 0.70 --    Basename 05/30/12 1127 05/30/12 0307  TROPONINI 11.35* 14.04*   Hepatic Function Panel  Basename 05/29/12 1728  PROT 7.7  ALBUMIN 3.8  AST 67*  ALT 29  ALKPHOS 72  BILITOT 1.0  BILIDIR --  IBILI --    Basename 05/31/12 0535  CHOL 137   No results found for this basename: PROTIME in the last 72 hours  Imaging: Dg Chest 2 View  05/29/2012  *RADIOLOGY REPORT*  Clinical Data: Dizziness.  CHEST - 2 VIEW  Comparison: Chest 11/15/2011.  Findings: Lungs are clear.  Heart size is normal.  No pneumothorax or pleural fluid.  IMPRESSION: Negative chest.  Clinically significant discrepancy from primary report, if provided: None  Original Report Authenticated By: Bernadene Bell. Maricela Curet, M.D.   Dg Chest Port 1 View  05/30/2012  *RADIOLOGY REPORT*  Clinical Data: Shortness of breath  PORTABLE CHEST - 1 VIEW  Comparison: Chest radiograph 05/29/2012  Findings: Normal cardiac silhouette.  There are low lung volumes. There is mild central venous congestion.  No effusion, infiltrate, or pneumothorax.  IMPRESSION: Low lung volumes and central venous congestion.  Original Report Authenticated By: Genevive Bi, M.D.    Cardiac Studies: Telemetry shows sinus tachycardia.  EKG shows persistent mild elevation ST in inferior leads. Assessment/Plan:  Patient Active Hospital Problem List: 1.  STEMI secondary to midvessel occlusion of RCA treated with DES 2. Hypotension post-MI.  Prior history of hypertension. 3. Diabetes mellitus 4. Neurofibromatosis. 5. Dyslipidemia.  Plan: Resume low dose toprol today.  Cardiac rehab.  Walk in hall today. Chest xray yesterday okay.  Check lipid panel and A1C. Possibly home tomorrow if stable today.   LOS: 2 days    Cassell Clement 05/31/2012, 8:23 AM

## 2012-05-31 NOTE — Progress Notes (Signed)
CARDIAC REHAB PHASE I   PRE:  Rate/Rhythm: 102 ST    BP: sitting 100/57    SaO2:   MODE:  Ambulation: 700 ft   POST:  Rate/Rhythm: 117 ST    BP: sitting 110/56     SaO2:   Tolerated very well, no sx. Feels good. Ed completed. [id number]  Harriet Masson CES, ACSM

## 2012-06-01 ENCOUNTER — Encounter (HOSPITAL_COMMUNITY): Payer: Self-pay | Admitting: Cardiology

## 2012-06-01 DIAGNOSIS — I2119 ST elevation (STEMI) myocardial infarction involving other coronary artery of inferior wall: Principal | ICD-10-CM

## 2012-06-01 MED ORDER — ASPIRIN 81 MG PO TABS: 81.0000 mg | ORAL_TABLET | Freq: Every day | ORAL | Status: DC

## 2012-06-01 MED ORDER — CIPROFLOXACIN HCL 250 MG PO TABS
250.0000 mg | ORAL_TABLET | Freq: Two times a day (BID) | ORAL | Status: DC
  Administered 2012-06-01: 250 mg via ORAL
  Filled 2012-06-01 (×3): qty 1

## 2012-06-01 MED ORDER — SODIUM CHLORIDE 0.9 % IV BOLUS (SEPSIS)
500.0000 mL | Freq: Once | INTRAVENOUS | Status: AC
  Administered 2012-06-01: 500 mL via INTRAVENOUS

## 2012-06-01 MED ORDER — CIPROFLOXACIN HCL 250 MG PO TABS: 250.0000 mg | ORAL_TABLET | Freq: Two times a day (BID) | ORAL | Status: DC

## 2012-06-01 MED ORDER — TICAGRELOR 90 MG PO TABS: 90.0000 mg | ORAL_TABLET | Freq: Two times a day (BID) | ORAL | Status: DC

## 2012-06-01 MED ORDER — LOSARTAN POTASSIUM 25 MG PO TABS: 25.0000 mg | ORAL_TABLET | Freq: Every day | ORAL | Status: DC

## 2012-06-01 MED ORDER — METOPROLOL SUCCINATE ER 25 MG PO TB24: 12.5000 mg | ORAL_TABLET | Freq: Every day | ORAL | Status: DC

## 2012-06-01 MED ORDER — NITROGLYCERIN 0.4 MG SL SUBL: 0.4000 mg | SUBLINGUAL_TABLET | SUBLINGUAL | Status: DC | PRN

## 2012-06-01 NOTE — Progress Notes (Signed)
Cardiac Rehab Phase I 1012 Pt is ready for discharge and just returned from walk independently.  Answered a few follow up questions on Phase II from yesterday's education. Thanks for the referral. Fabio Pierce, MA, ACSM RCEP

## 2012-06-01 NOTE — Progress Notes (Addendum)
Sue Bottoms, MD, [facility] ABIM Board Certified in Adult Cardiovascular Medicine,Internal Medicine and Critical Care Medicine      Subjective:    Patient complains of some mild dysuria.  She has no shortness of breath orthopnea or PND.  No palpitations.  Her beta blocker was held due to relative low blood pressure yesterday.  Clinically however she doesn't feel dizzy and has no symptoms of orthostasis.  She is able to walk without any restrictions.  Blood pressure much improved this morning.   Objective:   Weight Range:  Vital Signs:   Temp:  [98 F (36.7 C)-99.3 F (37.4 C)] 98.7 F (37.1 C) (06/15 0721) Resp:  [18-25] 18  (06/15 0400) BP: (88-105)/(38-71) 101/62 mmHg (06/15 0721) SpO2:  [92 %-97 %] 97 % (06/15 0721)    Weight change: Filed Weights   05/29/12 1726  Weight: 189 lb (85.73 kg)    Intake/Output:   Intake/Output Summary (Last 24 hours) at 06/01/12 4540 Last data filed at 06/01/12 0800  Gross per 24 hour  Intake   1190 ml  Output   1975 ml  Net   -785 ml     Physical Exam: General well-nourished white female in no distress Neck: Normal carotid upstroke and no carotid bruits.  JVP is flat at 90 angle. Lungs: Clear breath sounds bilaterally without wheezing or crackles Heart: Regular rate and rhythm tachycardic, normal S1 and S2 no pathological murmurs Extremity exam: No edema Neurologic: Alert and oriented x3 and grossly nonfocal.    Telemetry: Sinus tachycardia Labs: Basic Metabolic Panel:  Lab 05/30/12 9811 05/29/12 1801 05/29/12 1741 05/29/12 1728  NA 136 130* 137 135  K 3.7 3.2* 4.0 3.9  CL 101 91* 98 95*  CO2 25 -- -- 25  GLUCOSE 143* 170* 184* 186*  BUN 14 19 20 20   CREATININE 0.76 0.70 1.00 0.86  CALCIUM 8.2* -- -- 9.6  MG -- -- -- --  PHOS -- -- -- --    Liver Function Tests:  Lab 05/29/12 1728  AST 67*  ALT 29  ALKPHOS 72  BILITOT 1.0  PROT 7.7  ALBUMIN 3.8   No results found for this basename: LIPASE:5,AMYLASE:5 in the  last 168 hours No results found for this basename: AMMONIA:3 in the last 168 hours  CBC:  Lab 05/30/12 0300 05/29/12 1801 05/29/12 1741 05/29/12 1728 05/29/12 1508  WBC 10.9* -- -- 15.7* 16.4*  NEUTROABS -- -- -- 11.3* --  HGB 12.8 14.3 16.0* 15.5* 16.1  HCT 37.7 42.0 47.0* 45.4 49.6*  MCV 84.2 -- -- 84.1 86.4  PLT 220 -- -- 314 --    Cardiac Enzymes:  Lab 05/30/12 1127 05/30/12 0307 05/29/12 2017  CKTOTAL 256* 421* 584*  CKMB 15.5* 30.1* 51.2*  CKMBINDEX -- -- --  TROPONINI 11.35* 14.04* >25.00*     BNP: BNP (last 3 results)  Basename 11/15/11 1024  PROBNP 30.0    ABG    Component Value Date/Time   TCO2 23 05/29/2012 1801     Other results:  EKG: Not obtained  Imaging: Dg Chest Port 1 View  05/30/2012  *RADIOLOGY REPORT*  Clinical Data: Shortness of breath  PORTABLE CHEST - 1 VIEW  Comparison: Chest radiograph 05/29/2012  Findings: Normal cardiac silhouette.  There are low lung volumes. There is mild central venous congestion.  No effusion, infiltrate, or pneumothorax.  IMPRESSION: Low lung volumes and central venous congestion.  Original Report Authenticated By: Genevive Bi, M.D.      Medications:  Scheduled Medications:    . aspirin  81 mg Oral Daily  . beta carotene w/minerals  1 tablet Oral Daily  . clobetasol cream  1 application Topical BID  . glimepiride  4 mg Oral QAC breakfast  . losartan  25 mg Oral Daily  . metoprolol succinate  12.5 mg Oral Daily  . multivitamin with minerals  1 tablet Oral Daily  . omega-3 acid ethyl esters  1 g Oral Daily  . sodium chloride  3 mL Intravenous Q12H  . Ticagrelor  90 mg Oral BID  . DISCONTD: antiseptic oral rinse  15 mL Mouth Rinse BID     Infusions:     PRN Medications:  sodium chloride, acetaminophen, morphine injection, nitroGLYCERIN, ondansetron (ZOFRAN) IV, sodium chloride   Assessment:   #1 ST elevation myocardial infarction 1.  No significant coronary artery disease in the left  system. 2. 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 This was successfully treated with 3.5 x 16 Promus, drug-eluting stent post dilated to greater than 4 mm in diameter, Status post thrombectomy 3. Normal left ventricular systolic function. LVEDP 14 mmHg. Ejection fraction 55 %. 4. Review of echocardiogram is noted for grade 1 diastolic dysfunction but with normal filling pressures.  No evidence of elevated left atrial pressure by echo  tissue Doppler. #2 urinary tract infection secondary to Escherichia coli and sensitive #3 diabetes mellitus #4 neurofibromatosis #5 relative hypotension [patient never received beta blocker yesterday] \#6 dyslipidemia  Patient Active Problem List  Diagnosis  . Neurofibromatosis  . Diabetes mellitus  . HTN (hypertension)  . Obesity  . Osteoarthritis, knee  . Fatigue  . Chest pain  . Acute sinusitis, unspecified  . Herpes zoster  . Dyspnea  . STEMI (ST elevation myocardial infarction)      Plan/Discussion:    #1 patient recovered well post ST elevation myocardial infarction involving the right coronary artery status post stent placement.  Patient will need to stay on DAPT for minimal for one year. #2 primary care physician called this morning and outside urine culture was notable for pansensitive Escherichia coli and the patient still has some symptoms of dysuria.  We'll treat with ciprofloxacin for 3 days.  [Uncomplicated urinary tract infection] #3 patient never received beta blocker yesterday due to relative hypotension.  On physical examination she appears to be "dry" we will give her a fluid bolus of 500 cc of normal saline times one this morning.  She is also a little bit tachycardic.  His heart rate comes down with intravenous fluids and she can start on her low dose beta blocker and continue all other medications initiated by Dr. Patty Sermons. #4Statin drug therapy #5 followup with Dr. Patty Sermons in the office and  outpatient visits will be arranged prior to discharge.  Patient will be discharged as morning after intravenous fluid boluses completed and she received her first dose of low-dose beta blocker.   Length of Stay: 3   Alvin Critchley [facility] 06/01/2012, 8:28 AM

## 2012-06-01 NOTE — Discharge Summary (Signed)
Discharge Summary   Patient ID: [name] MRN: [id number], DOB/AGE: 05-23-1946 66 y.o.  Primary MD: Dr. Rubin Payor Primary Cardiologist: Dr. Patty Sermons Admit date: 05/29/2012 D/C date:     06/01/2012      Primary Discharge Diagnoses:  1. Acute Inferolateral STEMI  - Cath revealed occluded RCA with successful DES placement  - Echo revealed nl LV systolic function, grade 1 diastolic dysfunction  2. Urinary Tract Infection  - 2/2 E.coli, Initiated on Cipro  3. Hypotension  - Mild, improved with IVF  4. Hyperlipidemia  - H/o intolerance to statins, LDL 83  - F/u with Dr. Patty Sermons to discuss options  Secondary Discharge Diagnoses:  1. DM 2. HTN 3. Neurofibromatosis  4. Obesity  5. Osteoarthritis, knee 6. Fatigue  7. Herpes zoster  8. Acute sinusitis 9. Tonsillectomy and adenoidectomy   10. Clavicle surgery   11. Abdominal hysterectomy   Allergies Allergies  Allergen Reactions  . Amaryl Nausea Only  . Codeine Nausea And Vomiting  . Crestor (Rosuvastatin Calcium)   . Lipitor (Atorvastatin Calcium)     MYALGIAS  . Lovastatin     MYALGIAS   . Niaspan (Niacin Er) Nausea Only  . Sumycin (Tetracycline Hcl)   . Zetia (Ezetimibe)   . Zithromax (Azithromycin)   . Zocor (Simvastatin - High Dose)     MYALGIAS  . Zocor (Simvastatin)     Muscle pain    Diagnostic Studies/Procedures:   05/29/12 - Cardiac Cath HEMODYNAMICS: Aortic pressure was 91/56; LV pressure was 93/5; LVEDP 14. There was no gradient between the left ventricle and aorta.  ANGIOGRAPHIC DATA: The left main coronary artery is short and widely patent.  The left anterior descending artery is a large vessel proximally. It terminates before the apex. There are two large diagonals. The third diagonal is medium sized. The LAD system is widely patent. Left to right collaterals were noted  The left circumflex artery is a large vessel which has mild proximal disease. There is a small OM1 and a large branching  OM 2, both of which appear angiographically normal.  The right coronary artery is a large dominant vessel. There is a mid vessel occlusion. After intervention, it was revealed that the RCA was large. There was a large branch PL artery and a medium sized PDA. Mild irregularities were noted in the distal RCA.  LEFT VENTRICULOGRAM: Left ventricular angiogram was done in the 30 RAO projection and revealed Mild basal inferior hypokinesis and overall normal systolic function with an estimated ejection fraction of 55%. LVEDP was 14 mmHg.  IMPRESSIONS:  1. No significant coronary artery disease in the left system. 2. 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 This was successfully treated with 3.5 x 16 Promus, drug-eluting stent post dilated to greater than 4 mm in diameter 3. Normal left ventricular systolic function. LVEDP 14 mmHg. Ejection fraction 55 %. RECOMMENDATION: She should have dual antiplatelet therapy for at least a year. We'll watch in the CCU. Cycle cardiac enzymes. She is intolerant of statins. Continue beta blocker, ACE inhibitor, aspirin, brilinta.  05/30/12 - 2D Echocardiogram Study Conclusions: Left ventricle: The cavity size was normal. Wall thickness was normal. Systolic function was normal. The estimated ejection fraction was in the range of 55% to 60%. There is mild hypokinesis of the inferior myocardium. Doppler parameters are consistent with abnormal left ventricular relaxation (grade 1 diastolic dysfunction).   History of Present Illness: 66 y.o. female w/ the above medical problems who presented to [facility] on 05/29/12 with complaints of chest pain and found to have inferior STEMI.  She reported intermittent nausea, dizziness and weakness over 5-6 days that would resolve with rest, no chest pain, but did have intermittent back pain. The day of presentation these symptoms worsened prompting her to present to the ED.  Hospital Course: EKG  revealed inferolateral ST elevations. CXR was without acute cardiopulmonary abnormalities. Labs were significant for troponin 0.23. She was taken to the cath lab where she was found to have an occluded RCA that was successfully treated with DES. She tolerated the procedure well without complications. Recommendations were made for DAPT for at least one year. She developed asymptomatic hypotension for which her BB was held and she received IVF. Echo revealed nl LV systolic function, EF 55-60%, grade 1 diastolic dysfunction, mild hypokinesis of inferior myocardium, nl RV systolic function. Cardiac enzymes were cycled and trended down. She was able to ambulate with cardiac rehab without chest pain or dizziness.  On day of discharge she complained of some dysuria. Her primary care physician called and reported her outside urine culture was notable for pansensitive E.coli. She was started on Cipro with plans to continue for three days. She appeared dry on exam and was given IVF bolus with improvement. Low dose beta blocker was resumed and she tolerated it well without hypotension or dizziness. She was not initiated on statin, as she has a history of myalgias with statin use. Will need to follow up with Dr. Patty Sermons to discuss her options as her LDL is 65. She was seen and evaluated by Dr. Andee Lineman who felt she was stable for discharge home with plans for follow up as scheduled below.  Discharge Vitals: Blood pressure 105/64, pulse 92, temperature 98.7 F (37.1 C), temperature source Oral, resp. rate 18, height 5\' 3"  (1.6 m), weight 189 lb (85.73 kg), SpO2 97.00%.  Labs: Lab Results  Component Value Date   WBC 10.9* 05/30/2012   HGB 12.8 05/30/2012   HCT 37.7 05/30/2012   MCV 84.2 05/30/2012   PLT 220 05/30/2012     Lab 05/30/12 0300 05/29/12 1728  NA 136 --  K 3.7 --  CL 101 --  CO2 25 --  BUN 14 --  CREATININE 0.76 --  CALCIUM 8.2* --  PROT -- 7.7  BILITOT -- 1.0  ALKPHOS -- 72  ALT -- 29  AST --  67*  GLUCOSE 143* --    Basename 05/30/12 1127 05/30/12 0307 05/29/12 2017  CKTOTAL 256* 421* 584*  CKMB 15.5* 30.1* 51.2*  TROPONINI 11.35* 14.04* >25.00*   Lab Results  Component Value Date   CHOL 137 05/31/2012   HDL 42 05/31/2012   LDLCALC 83 05/31/2012   TRIG 60 05/31/2012     Discharge Medications   Medication List  As of 06/01/2012 11:19 AM   STOP taking these medications         ibuprofen 200 MG tablet      losartan-hydrochlorothiazide 100-12.5 MG per tablet         TAKE these medications         aspirin 81 MG tablet   Take 1 tablet (81 mg total) by mouth daily.      CALCIUM + D PO   Take by mouth daily.      CINNAMON PO   Take 1 capsule by mouth daily. OCCASIONALLY      ciprofloxacin 250 MG tablet   Commonly known as: CIPRO   Take 1 tablet (250  mg total) by mouth 2 (two) times daily.      clobetasol cream 0.05 %   Commonly known as: TEMOVATE   Apply 1 application topically 2 (two) times daily.      fish oil-omega-3 fatty acids 1000 MG capsule   Take 1 g by mouth daily.      glimepiride 2 MG tablet   Commonly known as: AMARYL   Take 4 mg by mouth daily before breakfast.      losartan 25 MG tablet   Commonly known as: COZAAR   Take 1 tablet (25 mg total) by mouth daily.      metFORMIN 1000 MG tablet   Commonly known as: GLUCOPHAGE   Take 1,000 mg by mouth 2 (two) times daily.      metoprolol succinate 25 MG 24 hr tablet   Commonly known as: TOPROL-XL   Take 0.5 tablets (12.5 mg total) by mouth daily.      multivitamin tablet   Take 1 tablet by mouth daily.      nitroGLYCERIN 0.4 MG SL tablet   Commonly known as: NITROSTAT   Place 1 tablet (0.4 mg total) under the tongue every 5 (five) minutes as needed for chest pain (up to 3 doses).      OCUVITE PO   Take by mouth daily.      Ticagrelor 90 MG Tabs tablet   Commonly known as: BRILINTA   Take 1 tablet (90 mg total) by mouth 2 (two) times daily.            Disposition   Discharge  Orders    Future Appointments: Provider: Department: Dept Phone: Center:   06/10/2012 11:50 AM [facility] [phone] [facility]   07/09/2012 10:00 AM Cassell Clement, MD [facility] [phone] None   07/09/2012 10:30 AM [facility] [phone] [facility]   10/22/2012 9:15 AM Collene Gobble, MD [facility] [phone] [facility]     Future Orders Please Complete By Expires   Amb Referral to Cardiac Rehabilitation      Diet - low sodium heart healthy      Increase activity slowly      Discharge instructions      Comments:   **PLEASE REMEMBER TO BRING ALL OF YOUR MEDICATIONS TO EACH OF YOUR FOLLOW-UP OFFICE VISITS.  * KEEP GROIN SITE CLEAN AND DRY. Call the office for any signs of bleedings, pus, swelling, increased pain, or any other concerns. * NO HEAVY LIFTING (>10lbs) X 4 WEEKS. * NO SEXUAL ACTIVITY X 4 WEEKS. * NO DRIVING X 2 WEEKS. * NO SOAKING BATHS, HOT TUBS, POOLS, ETC., X 5 DAYS.  * You were found to have a urinary tract infection. Please take your antibiotic (Cipro) this evening and the next two days as prescribed. If you continue to have symptoms please see your primary care provider.     Follow-up Information    Follow up with Cassell Clement, MD. (Our office will call you with your appointment time)    Contact information:   [facility] [address] [place] [address] [phone]           Outstanding Labs/Studies:  None  Duration of Discharge Encounter: Greater than 30 minutes including physician and PA time.  Signed, Shadd Dunstan PA-C 06/01/2012, 11:19 AM

## 2012-06-07 ENCOUNTER — Ambulatory Visit (INDEPENDENT_AMBULATORY_CARE_PROVIDER_SITE_OTHER): Payer: 59 | Admitting: Nurse Practitioner

## 2012-06-07 ENCOUNTER — Encounter: Payer: Self-pay | Admitting: Nurse Practitioner

## 2012-06-07 VITALS — BP 130/64 | HR 92 | Ht 63.0 in | Wt 192.4 lb

## 2012-06-07 DIAGNOSIS — I213 ST elevation (STEMI) myocardial infarction of unspecified site: Secondary | ICD-10-CM

## 2012-06-07 DIAGNOSIS — R300 Dysuria: Secondary | ICD-10-CM

## 2012-06-07 DIAGNOSIS — I219 Acute myocardial infarction, unspecified: Secondary | ICD-10-CM

## 2012-06-07 LAB — URINALYSIS, ROUTINE W REFLEX MICROSCOPIC
Bilirubin Urine: NEGATIVE
Hgb urine dipstick: NEGATIVE
Ketones, ur: NEGATIVE
Nitrite: NEGATIVE
Specific Gravity, Urine: >=1.03 (ref 1.000–1.030)
Total Protein, Urine: NEGATIVE
Urine Glucose: NEGATIVE
Urobilinogen, UA: 0.2 (ref 0.0–1.0)
pH: 5 (ref 5.0–8.0)

## 2012-06-07 MED ORDER — METOPROLOL SUCCINATE ER 25 MG PO TB24: 25.0000 mg | ORAL_TABLET | Freq: Two times a day (BID) | ORAL | Status: DC

## 2012-06-07 NOTE — Patient Instructions (Addendum)
We will recheck your urine today and make sure it is all clear now.  Increase the Metoprolol to two times a day. [facility]  We will see you in 2 weeks.  Continue with your walking and I have referred you to cardiac rehab.   Call the [facility] office at [phone] if you have any questions, problems or concerns.

## 2012-06-07 NOTE — Assessment & Plan Note (Signed)
Doing well post stent. No recurrent chest pain. Feels good on her current regimen. Heart rate still up some. On Brilinta. Will refer to cardiac rehab. I have increased her Toprol to BID. We will see her back in 2 weeks. Recheck urine today as well. Patient is agreeable to this plan and will call if any problems develop in the interim.

## 2012-06-07 NOTE — Progress Notes (Signed)
[name] Date of Birth: 1946/10/17 Medical Record #[id number]  History of Present Illness: [name] is seen today for a post hospital visit. She is seen for Dr. Patty Sermons. She has had a recent inferolateral MI with DES to an occluded RCA. She is on Brilinta for one year minimum. She has normal LV function per echo and grade 1 diastolic dysfunction. Other problems include DM, HTN, neurofibromatosis, obesity, OA, and chronic fatigue.   She comes in today. She is here alone. Doing very well. Shortness of breath has improved. No chest pain. Groin was bruised but getting better. She wants to attend cardiac rehab. Still with a little bit of back pain and wonders if her UTI has cleared. She is tolerating her medicines. Not lightheaded or dizzy. She is walking 12 minutes a day.   Current Outpatient Prescriptions on File Prior to Visit  Medication Sig Dispense Refill  . aspirin 81 MG tablet Take 1 tablet (81 mg total) by mouth daily.      . Calcium Carbonate-Vitamin D (CALCIUM + D PO) Take by mouth daily.        Marland Kitchen CINNAMON PO Take 1 capsule by mouth daily. OCCASIONALLY      . clobetasol cream (TEMOVATE) 0.05 % Apply 1 application topically 2 (two) times daily.       . fish oil-omega-3 fatty acids 1000 MG capsule Take 1 g by mouth daily.       Marland Kitchen glimepiride (AMARYL) 2 MG tablet Take 4 mg by mouth daily before breakfast.      . losartan (COZAAR) 25 MG tablet Take 1 tablet (25 mg total) by mouth daily.  30 tablet  6  . metFORMIN (GLUCOPHAGE) 1000 MG tablet Take 1,000 mg by mouth 2 (two) times daily.      . metoprolol succinate (TOPROL-XL) 25 MG 24 hr tablet Take 0.5 tablets (12.5 mg total) by mouth daily.  30 tablet  6  . Multiple Vitamin (MULTIVITAMIN) tablet Take 1 tablet by mouth daily.       . Multiple Vitamins-Minerals (OCUVITE PO) Take by mouth daily.        . nitroGLYCERIN (NITROSTAT) 0.4 MG SL tablet Place 1 tablet (0.4 mg total) under the tongue every 5 (five) minutes as needed for chest  pain (up to 3 doses).  25 tablet  3  . Ticagrelor (BRILINTA) 90 MG TABS tablet Take 1 tablet (90 mg total) by mouth 2 (two) times daily.  60 tablet  6    Allergies  Allergen Reactions  . Amaryl Nausea Only  . Codeine Nausea And Vomiting  . Crestor (Rosuvastatin Calcium)   . Lipitor (Atorvastatin Calcium)     MYALGIAS  . Lovastatin     MYALGIAS   . Niaspan (Niacin Er) Nausea Only  . Sumycin (Tetracycline Hcl)   . Zetia (Ezetimibe)   . Zithromax (Azithromycin)   . Zocor (Simvastatin - High Dose)     MYALGIAS  . Zocor (Simvastatin)     Muscle pain    Past Medical History  Diagnosis Date  . Neurofibromatosis   . Diabetes mellitus   . HTN (hypertension)   . Obesity   . Osteoarthritis, knee   . Hyperlipidemia     statin intolerant. LDL is 83  . 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 UTI (lower urinary tract infection) 05/29/12    Past Surgical History  Procedure Date  . Cardiac catheterization   .  Tonsillectomy and adenoidectomy   . Clavicle surgery   . Abdominal hysterectomy     History  Smoking status  . Never Smoker   Smokeless tobacco  . Never Used    History  Alcohol Use No    Family History  Problem Relation Age of Onset  . Heart disease Mother   . Arthritis Mother     Review of Systems: The review of systems is positive for mild back pain.  All other systems were reviewed and are negative.  Physical Exam: Ht 5\' 3"  (1.6 m)  Wt 192 lb 6.4 oz (87.272 kg)  BMI 34.08 kg/m2 Patient is very pleasant and in no acute distress. She is obese. Skin is warm and dry. Color is normal.  HEENT is unremarkable. Normocephalic/atraumatic. PERRL. Sclera are nonicteric. Neck is supple. No masses. No JVD. Lungs are clear. Cardiac exam shows a regular rate and rhythm. Rate is a little fast. Abdomen is soft. Extremities are without edema. Gait and ROM are intact. No gross neurologic deficits noted.  LABORATORY  DATA:    Chemistry      Component Value Date/Time   NA 136 05/30/2012 0300   K 3.7 05/30/2012 0300   CL 101 05/30/2012 0300   CO2 25 05/30/2012 0300   BUN 14 05/30/2012 0300   CREATININE 0.76 05/30/2012 0300   CREATININE 0.83 05/21/2012 1150   GLU 173 11/08/2010      Component Value Date/Time   CALCIUM 8.2* 05/30/2012 0300   ALKPHOS 72 05/29/2012 1728   AST 67* 05/29/2012 1728   ALT 29 05/29/2012 1728   BILITOT 1.0 05/29/2012 1728     Lab Results  Component Value Date   WBC 10.9* 05/30/2012   HGB 12.8 05/30/2012   HCT 37.7 05/30/2012   MCV 84.2 05/30/2012   PLT 220 05/30/2012     Lab Results  Component Value Date   CKTOTAL 256* 05/30/2012   CKMB 15.5* 05/30/2012   TROPONINI 11.35* 05/30/2012   Lab Results  Component Value Date   CHOL 137 05/31/2012   HDL 42 05/31/2012   LDLCALC 83 05/31/2012   LDLDIRECT 174.3 03/11/2012   TRIG 60 05/31/2012   CHOLHDL 3.3 05/31/2012    Assessment / Plan:

## 2012-06-08 NOTE — Discharge Summary (Signed)
Patient seen and examined with PA  Counseling was provided regarding the current medical condition and included: . Diagnosis, impressions, prognosis, recommended diagnostic studies  . Risks and benefits of treatment options  . Instructions for management, treatment and/or follow-up care  . Importance of compliance with treatment, risk factor reduction  . Patient and/or family education    Time spent counseling was 15 minutes and recorded in the Problem List documeted by Jessica Hope, PA-C   

## 2012-06-10 ENCOUNTER — Telehealth: Payer: Self-pay | Admitting: *Deleted

## 2012-06-10 ENCOUNTER — Ambulatory Visit
Admission: RE | Admit: 2012-06-10 | Discharge: 2012-06-10 | Disposition: A | Discharge status: Home / Self Care | Payer: 59 | Source: Ambulatory Visit | Attending: Obstetrics and Gynecology | Admitting: Obstetrics and Gynecology

## 2012-06-10 ENCOUNTER — Telehealth: Payer: Self-pay | Admitting: Cardiology

## 2012-06-10 DIAGNOSIS — Z1231 Encounter for screening mammogram for malignant neoplasm of breast: Secondary | ICD-10-CM

## 2012-06-10 NOTE — Telephone Encounter (Signed)
Advised of results

## 2012-06-10 NOTE — Telephone Encounter (Signed)
Message copied by Burnell Blanks on Mon Jun 10, 2012  5:39 PM ------      Message from: Rosalio Macadamia      Created: Sun Jun 09, 2012  8:17 PM       Ok to report.

## 2012-06-10 NOTE — Telephone Encounter (Signed)
Please return call to patient at [phone] to discuss medication at last visit with [name].

## 2012-06-10 NOTE — Telephone Encounter (Signed)
Rx for Toprol 25 mg bid called [facility] [phone], stated didn't get it electronically

## 2012-06-12 ENCOUNTER — Other Ambulatory Visit: Payer: Self-pay | Admitting: Obstetrics and Gynecology

## 2012-06-12 DIAGNOSIS — R928 Other abnormal and inconclusive findings on diagnostic imaging of breast: Secondary | ICD-10-CM

## 2012-06-17 ENCOUNTER — Ambulatory Visit (INDEPENDENT_AMBULATORY_CARE_PROVIDER_SITE_OTHER): Payer: Medicare Other | Admitting: Nurse Practitioner

## 2012-06-17 ENCOUNTER — Encounter: Payer: Self-pay | Admitting: Nurse Practitioner

## 2012-06-17 VITALS — BP 104/66 | HR 81 | Ht 63.0 in | Wt 189.0 lb

## 2012-06-17 DIAGNOSIS — I10 Essential (primary) hypertension: Secondary | ICD-10-CM

## 2012-06-17 DIAGNOSIS — I219 Acute myocardial infarction, unspecified: Secondary | ICD-10-CM

## 2012-06-17 DIAGNOSIS — I213 ST elevation (STEMI) myocardial infarction of unspecified site: Secondary | ICD-10-CM

## 2012-06-17 DIAGNOSIS — E669 Obesity, unspecified: Secondary | ICD-10-CM

## 2012-06-17 NOTE — Assessment & Plan Note (Signed)
Blood pressure looks good. No change with her current regimen.

## 2012-06-17 NOTE — Progress Notes (Signed)
[name] Date of Birth: [birth year] Medical Record #[id number]  History of Present Illness: [name] is seen back today for her 2 week check. She is seen for Dr. Patty Sermons. She has had recent inferolateral MI with DES to an occluded RCA. She is on Brilinta for one year. Has normal LV function with grade 1 diastolic dysfunction on her echo. Other problems include DM, HTN, neurofibromatosis, obesity, OA and chronic fatigue. We increased her Toprol for elevated heart rate at her last visit and referred her on to Cardiac Rehab.   She comes in today. She is here alone. She says she is doing well. No chest pain. Walking every day. Weight is down about 3 pounds since her last visit. Heart rate is coming down with the additional beta blocker. She has no chest pain. Very minimal shortness of breath and it continues to improve. She feels ok on her medicines. Blood sugars are ok. She is happy with how she is doing. She is not going to be able to afford Cardiac Rehab. She is exercising for 30 minutes twice a day at home. She is getting a repeat mammogram and ultrasound of the left breast on Wednesday.   Current Outpatient Prescriptions on File Prior to Visit  Medication Sig Dispense Refill  . aspirin 81 MG tablet Take 1 tablet (81 mg total) by mouth daily.      . Calcium Carbonate-Vitamin D (CALCIUM + D PO) Take by mouth daily.        Marland Kitchen CINNAMON PO Take 1 capsule by mouth daily. OCCASIONALLY      . clobetasol cream (TEMOVATE) 0.05 % Apply 1 application topically 2 (two) times daily.       . fish oil-omega-3 fatty acids 1000 MG capsule Take 1 g by mouth daily.       Marland Kitchen glimepiride (AMARYL) 2 MG tablet Take 4 mg by mouth daily before breakfast.      . losartan (COZAAR) 25 MG tablet Take 1 tablet (25 mg total) by mouth daily.  30 tablet  6  . metFORMIN (GLUCOPHAGE) 1000 MG tablet Take 1,000 mg by mouth 2 (two) times daily.      . metoprolol succinate (TOPROL-XL) 25 MG 24 hr tablet Take 1 tablet (25 mg  total) by mouth 2 (two) times daily.  60 tablet  6  . Multiple Vitamin (MULTIVITAMIN) tablet Take 1 tablet by mouth daily.       . Multiple Vitamins-Minerals (OCUVITE PO) Take by mouth daily.        . nitroGLYCERIN (NITROSTAT) 0.4 MG SL tablet Place 1 tablet (0.4 mg total) under the tongue every 5 (five) minutes as needed for chest pain (up to 3 doses).  25 tablet  3  . Ticagrelor (BRILINTA) 90 MG TABS tablet Take 1 tablet (90 mg total) by mouth 2 (two) times daily.  60 tablet  6    Allergies  Allergen Reactions  . Amaryl Nausea Only  . Codeine Nausea And Vomiting  . Crestor (Rosuvastatin Calcium)   . Lipitor (Atorvastatin Calcium)     MYALGIAS  . Lovastatin     MYALGIAS   . Niaspan (Niacin Er) Nausea Only  . Sumycin (Tetracycline Hcl)   . Zetia (Ezetimibe)   . Zithromax (Azithromycin)   . Zocor (Simvastatin - High Dose)     MYALGIAS  . Zocor (Simvastatin)     Muscle pain    Past Medical History  Diagnosis Date  . Neurofibromatosis   . Diabetes mellitus   .  HTN (hypertension)   . Obesity   . Osteoarthritis, knee   . Hyperlipidemia     statin intolerant. LDL is 83  . 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 UTI (lower urinary tract infection) 05/29/12    Past Surgical History  Procedure Date  . Cardiac catheterization   . Tonsillectomy and adenoidectomy   . Clavicle surgery   . Abdominal hysterectomy     History  Smoking status  . Never Smoker   Smokeless tobacco  . Never Used    History  Alcohol Use No    Family History  Problem Relation Age of Onset  . Heart disease Mother   . Arthritis Mother     Review of Systems: The review of systems is per the HPI.  All other systems were reviewed and are negative.  Physical Exam: BP 104/66  Pulse 81  Ht 5\' 3"  (1.6 m)  Wt 189 lb (85.73 kg)  BMI 33.48 kg/m2  SpO2 96% Patient is very pleasant and in no acute distress. She is obese. Skin is warm  and dry. Color is normal.  HEENT is unremarkable. Normocephalic/atraumatic. PERRL. Sclera are nonicteric. Neck is supple. No masses. No JVD. Lungs are clear. Cardiac exam shows a regular rate and rhythm. Heart rate is slower today.  Abdomen is soft. Extremities are without edema. Gait and ROM are intact. No gross neurologic deficits noted.  LABORATORY DATA:  Lab Results  Component Value Date   WBC 10.9* 05/30/2012   HGB 12.8 05/30/2012   HCT 37.7 05/30/2012   PLT 220 05/30/2012   GLUCOSE 143* 05/30/2012   CHOL 137 05/31/2012   TRIG 60 05/31/2012   HDL 42 05/31/2012   LDLDIRECT 174.3 03/11/2012   LDLCALC 83 05/31/2012   ALT 29 05/29/2012   AST 67* 05/29/2012   NA 136 05/30/2012   K 3.7 05/30/2012   CL 101 05/30/2012   CREATININE 0.76 05/30/2012   BUN 14 05/30/2012   CO2 25 05/30/2012   TSH 0.872 05/21/2012   INR 1.03 10/04/2009   HGBA1C 7.0* 05/30/2012   MICROALBUR 0.50 05/21/2012   Echo Study Conclusions From June 2013  Left ventricle: The cavity size was normal. Wall thickness was normal. Systolic function was normal. The estimated ejection fraction was in the range of 55% to 60%. There is mild hypokinesis of the inferior myocardium. Doppler parameters are consistent with abnormal left ventricular relaxation (grade 1 diastolic dysfunction).   Assessment / Plan:

## 2012-06-17 NOTE — Patient Instructions (Signed)
I think you are doing well.  You may drive.  Keep up your exercise program with a goal of 60 minutes per day  We will see you back in July as scheduled.  Call the [facility] office at [phone] if you have any questions, problems or concerns.

## 2012-06-17 NOTE — Assessment & Plan Note (Signed)
She continues to do well. Heart rate is in the 80's now with additional beta blocker therapy. She is asymptomatic. She is given the ok to drive and to resume sexual activities. She sees Dr. Patty Sermons in 3 weeks with fasting labs. She is to continue with her exercise program. No change in her current regimen. Patient is agreeable to this plan and will call if any problems develop in the interim.

## 2012-06-17 NOTE — Assessment & Plan Note (Signed)
She is already down 3 pounds in 2 weeks. I encouraged her to keep up with her exercise habit.

## 2012-06-19 ENCOUNTER — Ambulatory Visit
Admission: RE | Admit: 2012-06-19 | Discharge: 2012-06-19 | Disposition: A | Discharge status: Home / Self Care | Payer: Medicare Other | Source: Ambulatory Visit | Attending: Obstetrics and Gynecology | Admitting: Obstetrics and Gynecology

## 2012-06-19 DIAGNOSIS — R928 Other abnormal and inconclusive findings on diagnostic imaging of breast: Secondary | ICD-10-CM

## 2012-06-21 ENCOUNTER — Other Ambulatory Visit: Payer: 59

## 2012-06-28 ENCOUNTER — Telehealth: Payer: Self-pay | Admitting: Cardiology

## 2012-06-28 NOTE — Telephone Encounter (Signed)
Sent over new Rx  

## 2012-06-28 NOTE — Telephone Encounter (Signed)
Pt went to get test strips and for insurance to pay we need to state how many times she checks her sugar a day [facility] [address]

## 2012-07-09 ENCOUNTER — Encounter: Payer: Self-pay | Admitting: Cardiology

## 2012-07-09 ENCOUNTER — Ambulatory Visit (INDEPENDENT_AMBULATORY_CARE_PROVIDER_SITE_OTHER): Payer: Medicare Other | Admitting: Cardiology

## 2012-07-09 ENCOUNTER — Other Ambulatory Visit (INDEPENDENT_AMBULATORY_CARE_PROVIDER_SITE_OTHER): Payer: Medicare Other

## 2012-07-09 VITALS — BP 122/72 | HR 78 | Ht 63.0 in | Wt 190.0 lb

## 2012-07-09 DIAGNOSIS — R0989 Other specified symptoms and signs involving the circulatory and respiratory systems: Secondary | ICD-10-CM

## 2012-07-09 DIAGNOSIS — E119 Type 2 diabetes mellitus without complications: Secondary | ICD-10-CM

## 2012-07-09 DIAGNOSIS — I119 Hypertensive heart disease without heart failure: Secondary | ICD-10-CM

## 2012-07-09 DIAGNOSIS — IMO0001 Reserved for inherently not codable concepts without codable children: Secondary | ICD-10-CM

## 2012-07-09 DIAGNOSIS — I10 Essential (primary) hypertension: Secondary | ICD-10-CM

## 2012-07-09 DIAGNOSIS — I2510 Atherosclerotic heart disease of native coronary artery without angina pectoris: Secondary | ICD-10-CM

## 2012-07-09 LAB — LIPID PANEL
Cholesterol: 229 mg/dL — ABNORMAL HIGH (ref 0–200)
HDL: 49.8 mg/dL (ref >39.00)
Total CHOL/HDL Ratio: 5
Triglycerides: 75 mg/dL (ref 0.0–149.0)
VLDL: 15 mg/dL (ref 0.0–40.0)

## 2012-07-09 LAB — BASIC METABOLIC PANEL
CO2: 28 mEq/L (ref 19–32)
Calcium: 9.2 mg/dL (ref 8.4–10.5)
Potassium: 4.1 mEq/L (ref 3.5–5.1)
Sodium: 140 mEq/L (ref 135–145)

## 2012-07-09 LAB — HEPATIC FUNCTION PANEL
AST: 20 U/L (ref 0–37)
Albumin: 3.8 g/dL (ref 3.5–5.2)

## 2012-07-09 LAB — LDL CHOLESTEROL, DIRECT: Direct LDL: 174.4 mg/dL

## 2012-07-09 NOTE — Patient Instructions (Addendum)
Your physician recommends that you continue on your current medications as directed. Please refer to the Current Medication list given to you today.  Your physician recommends that you schedule a follow-up appointment in: 3 months with Dr. Patty Sermons & an EKG

## 2012-07-09 NOTE — Assessment & Plan Note (Signed)
The patient has had no recurrent chest pain or angina.  He denies any unusual dizziness and no palpitations.  He is not having any bleeding problems from the dual antiplatelet therapy

## 2012-07-09 NOTE — Assessment & Plan Note (Signed)
The patient has not been experiencing any hypoglycemic episodes.  We're checking a hemoglobin A1c today

## 2012-07-09 NOTE — Assessment & Plan Note (Signed)
Blood pressure has stayed stable.  She is not having any headaches or dizziness

## 2012-07-09 NOTE — Progress Notes (Signed)
[name] Date of Birth:  1946-04-02 [facility] [address] [place], [place]  [address] [phone]         Fax   [phone]  History of Present Illness: This pleasant 66 year old woman is seen for a scheduled followup office visit.  She did not have any prior history of known ischemic heart disease.  On 05/29/12 she presented with acute chest pain and had a STEMI and was taken to the Cath Lab by Dr. 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  She will be on dual antiplatelet therapy withBrilinta and aspirin for one year.  The patient also has a history of hypercholesterolemia, diabetes, and neurofibromatosis.  She has a past history of high blood pressure.  Current Outpatient Prescriptions  Medication Sig Dispense Refill  . aspirin 81 MG tablet Take 1 tablet (81 mg total) by mouth daily.      . Calcium Carbonate-Vitamin D (CALCIUM + D PO) Take by mouth daily.        Marland Kitchen CINNAMON PO Take 1 capsule by mouth daily. OCCASIONALLY      . clobetasol cream (TEMOVATE) 0.05 % Apply 1 application topically 2 (two) times daily.       . fish oil-omega-3 fatty acids 1000 MG capsule Take 1 g by mouth daily.       Marland Kitchen glimepiride (AMARYL) 2 MG tablet Take 4 mg by mouth daily before breakfast.      . losartan (COZAAR) 25 MG tablet Take 1 tablet (25 mg total) by mouth daily.  30 tablet  6  . metFORMIN (GLUCOPHAGE) 1000 MG tablet Take 1,000 mg by mouth 2 (two) times daily.      . metoprolol succinate (TOPROL-XL) 25 MG 24 hr tablet Take 1 tablet (25 mg total) by mouth 2 (two) times daily.  60 tablet  6  . Multiple Vitamin (MULTIVITAMIN) tablet Take 1 tablet by mouth daily.       . Multiple Vitamins-Minerals (OCUVITE PO) Take by mouth daily.        . nitroGLYCERIN (NITROSTAT) 0.4 MG SL tablet Place 1 tablet (0.4 mg total) under the tongue every 5 (five) minutes as needed for chest pain (up to 3 doses).  25 tablet  3  . Ticagrelor (BRILINTA) 90  MG TABS tablet Take 1 tablet (90 mg total) by mouth 2 (two) times daily.  60 tablet  6    Allergies  Allergen Reactions  . Amaryl Nausea Only  . Codeine Nausea And Vomiting  . Crestor (Rosuvastatin Calcium)   . Lipitor (Atorvastatin Calcium)     MYALGIAS  . Lovastatin     MYALGIAS   . Niaspan (Niacin Er) Nausea Only  . Sumycin (Tetracycline Hcl)   . Zetia (Ezetimibe)   . Zithromax (Azithromycin)   . Zocor (Simvastatin - High Dose)     MYALGIAS  . Zocor (Simvastatin)     Muscle pain    Patient Active Problem List  Diagnosis  . Neurofibromatosis  . Diabetes mellitus  . HTN (hypertension)  . Obesity  . Osteoarthritis, knee  . Fatigue  . Chest pain  . Acute sinusitis, unspecified  . Herpes zoster  . Dyspnea  . STEMI (ST elevation myocardial infarction)    History  Smoking status  . Never Smoker   Smokeless tobacco  . Never Used    History  Alcohol Use No    Family History  Problem Relation Age of Onset  . Heart disease Mother   .  Arthritis Mother     Review of Systems: Constitutional: no fever chills diaphoresis or fatigue or change in weight.  Head and neck: no hearing loss, no epistaxis, no photophobia or visual disturbance. Respiratory: No cough, shortness of breath or wheezing. Cardiovascular: No chest pain peripheral edema, palpitations. Gastrointestinal: No abdominal distention, no abdominal pain, no change in bowel habits hematochezia or melena. Genitourinary: No dysuria, no frequency, no urgency, no nocturia. Musculoskeletal:No arthralgias, no back pain, no gait disturbance or myalgias. Neurological: No dizziness, no headaches, no numbness, no seizures, no syncope, no weakness, no tremors. Hematologic: No lymphadenopathy, no easy bruising. Psychiatric: No confusion, no hallucinations, no sleep disturbance.    Physical Exam: Filed Vitals:   07/09/12 1013  BP: 122/72  Pulse: 78   the general appearance reveals a well-developed well-nourished  woman in no distress.  The skin reveals stigmata of neurofibromatosis.The head and neck exam reveals pupils equal and reactive.  Extraocular movements are full.  There is no scleral icterus.  The mouth and pharynx are normal.  The neck is supple.  The carotids reveal no bruits.  The jugular venous pressure is normal.  The  thyroid is not enlarged.  There is no lymphadenopathy.  The chest is clear to percussion and auscultation.  There are no rales or rhonchi.  Expansion of the chest is symmetrical.  The precordium is quiet.  The first heart sound is normal.  The second heart sound is physiologically split.  There is no murmur gallop rub or click.  There is no abnormal lift or heave.  The abdomen is soft and nontender.  The bowel sounds are normal.  The liver and spleen are not enlarged.  There are no abdominal masses.  There are no abdominal bruits.  Extremities reveal good pedal pulses.  There is no phlebitis or edema.  There is no cyanosis or clubbing.  Strength is normal and symmetrical in all extremities.  There is no lateralizing weakness.  There are no sensory deficits.  The skin is warm and dry.  There is no rash.     Assessment / Plan: Patient is to continue same medication.  She is doing well.  We're checking full labs today including A1c.  She will return in 3 months for followup office visit and EKG.

## 2012-07-10 NOTE — Progress Notes (Signed)
Quick Note:  Please report to patient. The recent labs are stable. Continue same medication and careful diet. The A1c is down to 6.5 which is very good. The LDL cholesterol is still very high so continue to work hard on diet. I would also like her to try WelChol 1875 mg packet twice a day with meals to try to get her cholesterol down further since she cannot tolerate any of the statins or ezetimibe. ______

## 2012-07-19 ENCOUNTER — Telehealth: Payer: Self-pay | Admitting: *Deleted

## 2012-07-19 NOTE — Telephone Encounter (Signed)
Message copied by Burnell Blanks on Fri Jul 19, 2012  2:41 PM ------      Message from: Cassell Clement      Created: Wed Jul 10, 2012  1:57 PM       Please report to patient.  The recent labs are stable. Continue same medication and careful diet.  The A1c is down to 6.5 which is very good.  The LDL cholesterol is still very high so continue to work hard on diet.  I would also like her to try WelChol 1875 mg packet twice a day with meals to try to get her cholesterol down further since she cannot tolerate any of the statins or ezetimibe.

## 2012-07-19 NOTE — Telephone Encounter (Signed)
Patient would like to work harder on her diet. Discussed with  Dr. Patty Sermons and he is ok with that. Advised patient

## 2012-07-23 ENCOUNTER — Other Ambulatory Visit: Payer: Self-pay | Admitting: Cardiology

## 2012-07-23 NOTE — Telephone Encounter (Signed)
lmtcb

## 2012-07-23 NOTE — Telephone Encounter (Signed)
Please return call to patient at [phone] to discuss medication dosages

## 2012-07-24 MED ORDER — METOPROLOL SUCCINATE ER 25 MG PO TB24: 25.0000 mg | ORAL_TABLET | Freq: Two times a day (BID) | ORAL | Status: DC

## 2012-07-24 NOTE — Telephone Encounter (Signed)
Refilled metoprolol and notified patient

## 2012-10-09 ENCOUNTER — Telehealth: Payer: Self-pay | Admitting: Cardiology

## 2012-10-09 NOTE — Telephone Encounter (Signed)
Pt's dentist dr Albin Felling told her he has been trying to get in touch with our office with no response, I don't see a message, just having a teeth cleaning, pls call dr Albin Felling [phone]

## 2012-10-09 NOTE — Telephone Encounter (Signed)
Spoke with Marylu Lund with Dr Sandrea Hammond office regarding a teeth cleaning for patient.  Patient had a stent in June and they were wanting to know if patient needed premed (advised no) but was not sure if patient needed to wait any specific amount of time. Will forward to  Dr. Patty Sermons for review

## 2012-10-09 NOTE — Telephone Encounter (Signed)
3 months would be adequate.  Okay to proceed with cleaning.

## 2012-10-10 NOTE — Telephone Encounter (Signed)
Advised patient and dental office

## 2012-10-13 ENCOUNTER — Telehealth: Payer: Self-pay | Admitting: Cardiology

## 2012-10-13 NOTE — Telephone Encounter (Signed)
Received a call from Ms. Roback notifying us that she woke up with a nose bleed during the night. She was started on ASA and Brilinta in June for a STEMI w/ DES to RCA. She held pressure and it stopped. This morning she noted a little bit of blood coming out of one of her nares. No clots. She stated it is not a lot. She denies chest pain or sob. No blood in stool. I instructed her to hold pressure and notify me if it does not stop. I told her to continue to take her ASA/Brilinta. She has an appointment with Dr. Patty Sermons on 10/29 and will discuss with him at that time if he wants her to continue DAPT. She stated understanding.  Crystal, PA-C 10/13/2012 8:31 AM

## 2012-10-13 NOTE — Telephone Encounter (Signed)
Agree with advice given

## 2012-10-15 ENCOUNTER — Encounter: Payer: Self-pay | Admitting: Cardiology

## 2012-10-15 ENCOUNTER — Ambulatory Visit (INDEPENDENT_AMBULATORY_CARE_PROVIDER_SITE_OTHER): Payer: Medicare Other | Admitting: Cardiology

## 2012-10-15 VITALS — BP 130/70 | HR 75 | Resp 18 | Ht 65.0 in | Wt 192.8 lb

## 2012-10-15 DIAGNOSIS — I259 Chronic ischemic heart disease, unspecified: Secondary | ICD-10-CM

## 2012-10-15 DIAGNOSIS — J019 Acute sinusitis, unspecified: Secondary | ICD-10-CM

## 2012-10-15 DIAGNOSIS — I119 Hypertensive heart disease without heart failure: Secondary | ICD-10-CM

## 2012-10-15 NOTE — Assessment & Plan Note (Signed)
The patient has a history of sinus congestion.  Several weeks ago she awoke with a bloody nose.  Her epistaxis responded to applying ice to the nose.  She has not had any recurrence.  She is on dual antiplatelet therapy. For help with her chronic sinus congestion she will take generic Mucinex on a when necessary basis.

## 2012-10-15 NOTE — Assessment & Plan Note (Signed)
Blood pressure is remaining stable on current therapy. 

## 2012-10-15 NOTE — Assessment & Plan Note (Signed)
The patient has not been experiencing any hypoglycemic episodes. 

## 2012-10-15 NOTE — Patient Instructions (Addendum)
Your physician recommends that you schedule a follow-up appointment in: 3 months with fasting labs (LP/BMET/HFP/cbc)  Use Plain Mucinex as Needed and continue all other Medications.

## 2012-10-15 NOTE — Progress Notes (Signed)
[name] Date of Birth:  November 18, 1946 [facility] [address] [place], [place]  [address] [phone]         Fax   [phone]  History of Present Illness: This pleasant 66 year old woman is seen for a scheduled followup office visit. She did not have any prior history of known ischemic heart disease. On 05/29/12 she presented with acute chest pain and had a STEMI and was taken to the Cath Lab by Dr. 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 She will be on dual antiplatelet therapy withBrilinta and aspirin for one year. The patient also has a history of hypercholesterolemia, diabetes, and neurofibromatosis. She has a past history of high blood pressure.  Since last visit she has been doing well.   Current Outpatient Prescriptions  Medication Sig Dispense Refill  . aspirin 81 MG tablet Take 1 tablet (81 mg total) by mouth daily.      . Calcium Carbonate-Vitamin D (CALCIUM + D PO) Take by mouth daily.        Marland Kitchen CINNAMON PO Take 1 capsule by mouth daily. OCCASIONALLY      . clobetasol cream (TEMOVATE) 0.05 % Apply 1 application topically 2 (two) times daily.       . fish oil-omega-3 fatty acids 1000 MG capsule Take 1 g by mouth daily.       Marland Kitchen glimepiride (AMARYL) 2 MG tablet Take 4 mg by mouth daily before breakfast.      . guaiFENesin (MUCINEX) 600 MG 12 hr tablet Take 600 mg by mouth 2 (two) times daily as needed.      Marland Kitchen losartan (COZAAR) 25 MG tablet Take 1 tablet (25 mg total) by mouth daily.  30 tablet  6  . metFORMIN (GLUCOPHAGE) 1000 MG tablet Take 1,000 mg by mouth 2 (two) times daily.      . metoprolol succinate (TOPROL-XL) 25 MG 24 hr tablet Take 1 tablet (25 mg total) by mouth 2 (two) times daily.  180 tablet  3  . Multiple Vitamin (MULTIVITAMIN) tablet Take 1 tablet by mouth daily.       . Multiple Vitamins-Minerals (OCUVITE PO) Take by mouth daily.        . nitroGLYCERIN (NITROSTAT) 0.4 MG SL tablet Place 1  tablet (0.4 mg total) under the tongue every 5 (five) minutes as needed for chest pain (up to 3 doses).  25 tablet  3  . Ticagrelor (BRILINTA) 90 MG TABS tablet Take 1 tablet (90 mg total) by mouth 2 (two) times daily.  60 tablet  6    Allergies  Allergen Reactions  . Amaryl Nausea Only  . Codeine Nausea And Vomiting  . Crestor (Rosuvastatin Calcium)   . Lipitor (Atorvastatin Calcium)     MYALGIAS  . Lovastatin     MYALGIAS   . Niaspan (Niacin Er) Nausea Only  . Sumycin (Tetracycline Hcl)   . Zetia (Ezetimibe)   . Zithromax (Azithromycin)   . Zocor (Simvastatin - High Dose)     MYALGIAS  . Zocor (Simvastatin)     Muscle pain    Patient Active Problem List  Diagnosis  . Neurofibromatosis  . Diabetes mellitus  . HTN (hypertension)  . Obesity  . Osteoarthritis, knee  . Fatigue  . Chest pain  . Acute sinusitis, unspecified  . Herpes zoster  . Dyspnea  . STEMI (ST elevation myocardial infarction)    History  Smoking status  . Never Smoker   Smokeless  tobacco  . Never Used    History  Alcohol Use No    Family History  Problem Relation Age of Onset  . Heart disease Mother   . Arthritis Mother     Review of Systems: Constitutional: no fever chills diaphoresis or fatigue or change in weight.  Head and neck: no hearing loss, no epistaxis, no photophobia or visual disturbance. Respiratory: No cough, shortness of breath or wheezing. Cardiovascular: No chest pain peripheral edema, palpitations. Gastrointestinal: No abdominal distention, no abdominal pain, no change in bowel habits hematochezia or melena. Genitourinary: No dysuria, no frequency, no urgency, no nocturia. Musculoskeletal:No arthralgias, no back pain, no gait disturbance or myalgias. Neurological: No dizziness, no headaches, no numbness, no seizures, no syncope, no weakness, no tremors. Hematologic: No lymphadenopathy, no easy bruising. Psychiatric: No confusion, no hallucinations, no sleep  disturbance.    Physical Exam: Filed Vitals:   10/15/12 1041  BP: 130/70  Pulse: 75  Resp: 18   the general appearance reveals a well-developed well-nourished woman in no distress.  She has a skin lesions of neurofibromatosis.The head and neck exam reveals pupils equal and reactive.  Extraocular movements are full.  There is no scleral icterus.  The mouth and pharynx are normal.  The neck is supple.  The carotids reveal no bruits.  The jugular venous pressure is normal.  The  thyroid is not enlarged.  There is no lymphadenopathy.  The chest is clear to percussion and auscultation.  There are no rales or rhonchi.  Expansion of the chest is symmetrical.  The precordium is quiet.  The first heart sound is normal.  The second heart sound is physiologically split.  There is no murmur gallop rub or click.  There is no abnormal lift or heave.  The abdomen is soft and nontender.  The bowel sounds are normal.  The liver and spleen are not enlarged.  There are no abdominal masses.  There are no abdominal bruits.  Extremities reveal good pedal pulses.  There is no phlebitis or edema.  There is no cyanosis or clubbing.  Strength is normal and symmetrical in all extremities.  There is no lateralizing weakness.  There are no sensory deficits.  The skin is warm and dry.  There is no rash.  She does have multiple large caf au lait spots.   EKG today shows normal sinus rhythm and old inferior wall myocardial infarction  Assessment / Plan: Continue on same medication.  Add Mucinex as necessary for sinus congestion.  Recheck in 3 months for followup office visit lipid panel hepatic function panel basal metabolic panel and CBC

## 2012-10-15 NOTE — Assessment & Plan Note (Signed)
The patient has had no recurrent angina since her successful angioplasty and stent. Her electrocardiogram today shows a pattern of an old inferior wall myocardial infarction but no ischemic changes

## 2012-10-22 ENCOUNTER — Ambulatory Visit: Payer: 59 | Admitting: Emergency Medicine

## 2012-12-30 ENCOUNTER — Other Ambulatory Visit: Payer: Self-pay

## 2012-12-30 MED ORDER — LOSARTAN POTASSIUM 25 MG PO TABS: 25.0000 mg | ORAL_TABLET | Freq: Every day | ORAL | Status: DC

## 2012-12-30 MED ORDER — TICAGRELOR 90 MG PO TABS: 90.0000 mg | ORAL_TABLET | Freq: Two times a day (BID) | ORAL | Status: DC

## 2012-12-31 ENCOUNTER — Other Ambulatory Visit: Payer: Self-pay

## 2012-12-31 MED ORDER — TICAGRELOR 90 MG PO TABS: 90.0000 mg | ORAL_TABLET | Freq: Two times a day (BID) | ORAL | Status: DC

## 2012-12-31 MED ORDER — LOSARTAN POTASSIUM 25 MG PO TABS: 25.0000 mg | ORAL_TABLET | Freq: Every day | ORAL | Status: DC

## 2013-01-01 ENCOUNTER — Other Ambulatory Visit: Payer: Self-pay | Admitting: *Deleted

## 2013-01-01 MED ORDER — LOSARTAN POTASSIUM 25 MG PO TABS: 25.0000 mg | ORAL_TABLET | Freq: Every day | ORAL | Status: DC

## 2013-01-01 MED ORDER — TICAGRELOR 90 MG PO TABS: 90.0000 mg | ORAL_TABLET | Freq: Two times a day (BID) | ORAL | Status: DC

## 2013-01-02 ENCOUNTER — Other Ambulatory Visit: Payer: Self-pay

## 2013-01-02 MED ORDER — TICAGRELOR 90 MG PO TABS: 90.0000 mg | ORAL_TABLET | Freq: Two times a day (BID) | ORAL | Status: DC

## 2013-01-14 ENCOUNTER — Other Ambulatory Visit: Payer: Medicare Other

## 2013-01-14 ENCOUNTER — Ambulatory Visit: Payer: Medicare Other | Admitting: Cardiology

## 2013-01-21 ENCOUNTER — Ambulatory Visit
Admission: RE | Admit: 2013-01-21 | Discharge: 2013-01-21 | Disposition: A | Discharge status: Home / Self Care | Payer: Medicare Other | Source: Ambulatory Visit | Attending: Cardiology | Admitting: Cardiology

## 2013-01-21 ENCOUNTER — Encounter: Payer: Self-pay | Admitting: Cardiology

## 2013-01-21 ENCOUNTER — Ambulatory Visit (INDEPENDENT_AMBULATORY_CARE_PROVIDER_SITE_OTHER): Payer: Medicare Other | Admitting: Cardiology

## 2013-01-21 VITALS — BP 136/70 | HR 67 | Resp 18 | Ht 64.0 in | Wt 198.0 lb

## 2013-01-21 DIAGNOSIS — I219 Acute myocardial infarction, unspecified: Secondary | ICD-10-CM

## 2013-01-21 DIAGNOSIS — I259 Chronic ischemic heart disease, unspecified: Secondary | ICD-10-CM

## 2013-01-21 DIAGNOSIS — R079 Chest pain, unspecified: Secondary | ICD-10-CM

## 2013-01-21 DIAGNOSIS — I119 Hypertensive heart disease without heart failure: Secondary | ICD-10-CM

## 2013-01-21 DIAGNOSIS — I213 ST elevation (STEMI) myocardial infarction of unspecified site: Secondary | ICD-10-CM

## 2013-01-21 DIAGNOSIS — IMO0001 Reserved for inherently not codable concepts without codable children: Secondary | ICD-10-CM

## 2013-01-21 LAB — CBC WITH DIFFERENTIAL/PLATELET
Basophils Relative: 0.7 % (ref 0.0–3.0)
Eosinophils Relative: 1.7 % (ref 0.0–5.0)
Lymphocytes Relative: 26.2 % (ref 12.0–46.0)
Neutrophils Relative %: 61 % (ref 43.0–77.0)
RBC: 4.87 Mil/uL (ref 3.87–5.11)
WBC: 5.9 10*3/uL (ref 4.5–10.5)

## 2013-01-21 LAB — HEPATIC FUNCTION PANEL
ALT: 23 U/L (ref 0–35)
Total Bilirubin: 0.8 mg/dL (ref 0.3–1.2)
Total Protein: 6.8 g/dL (ref 6.0–8.3)

## 2013-01-21 LAB — BASIC METABOLIC PANEL
BUN: 13 mg/dL (ref 6–23)
CO2: 29 mEq/L (ref 19–32)
Chloride: 104 mEq/L (ref 96–112)
Creatinine, Ser: 0.8 mg/dL (ref 0.4–1.2)

## 2013-01-21 LAB — LIPID PANEL
Cholesterol: 212 mg/dL — ABNORMAL HIGH (ref 0–200)
VLDL: 21.4 mg/dL (ref 0.0–40.0)

## 2013-01-21 NOTE — Assessment & Plan Note (Signed)
The patient has occasional episodes of hypoglycemia.  She had one episode about a week ago associated with diaphoresis and 8 responded quickly to eating some high sugar foods and drink.

## 2013-01-21 NOTE — Assessment & Plan Note (Signed)
The patient has not had any recurrence of angina pectoris.  She has not had to take any sublingual nitroglycerin.

## 2013-01-21 NOTE — Progress Notes (Signed)
Quick Note:  Please report to patient. The recent labs are stable. Continue same medication and careful diet. Lipids are better. BS 116 better but A1C is 6.9 slightly higher. Work on diet and weight loss. ______

## 2013-01-21 NOTE — Patient Instructions (Addendum)
Will obtain labs today and call you with the results (lp/bmet/hfp/cbc)  DECREASE YOUR TOPROL (METOPROLOL) TO 25 MG ONCE A DAY  Want for you to go to [facility] for chest xray   Your physician recommends that you schedule a follow-up appointment in: 4 months with fasting labs (lp/bmet/hfp)

## 2013-01-21 NOTE — Assessment & Plan Note (Signed)
The patient is not having any symptoms of congestive heart failure.  She's not having any dizziness or syncope.  Blood pressure has been satisfactory

## 2013-01-21 NOTE — Progress Notes (Signed)
[name] Date of Birth:  Mar 18, 1946 [facility] [address] [place], [place]  [address] [phone]         Fax   [phone]  History of Present Illness: This pleasant 67 year old woman is seen for a scheduled followup office visit. She did not have any prior history of known ischemic heart disease. On 05/29/12 she presented with acute chest pain and had a STEMI and was taken to the Cath Lab by Dr. 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 She will be on dual antiplatelet therapy withBrilinta and aspirin for one year. The patient also has a history of hypercholesterolemia, diabetes, and neurofibromatosis. She has a past history of high blood pressure. Since last visit she has been doing well.  She has been having a dull aching discomfort at the base of her left lung posteriorly for about the past 2 weeks.  Current Outpatient Prescriptions  Medication Sig Dispense Refill  . aspirin 81 MG tablet Take 1 tablet (81 mg total) by mouth daily.      . Calcium Carbonate-Vitamin D (CALCIUM + D PO) Take by mouth daily.        Marland Kitchen CINNAMON PO Take 1 capsule by mouth daily. OCCASIONALLY      . clobetasol cream (TEMOVATE) 0.05 % Apply 1 application topically 2 (two) times daily.       . fish oil-omega-3 fatty acids 1000 MG capsule Take 1 g by mouth daily.       Marland Kitchen glimepiride (AMARYL) 2 MG tablet Take 4 mg by mouth daily before breakfast.      . guaiFENesin (MUCINEX) 600 MG 12 hr tablet Take 600 mg by mouth 2 (two) times daily as needed.      Marland Kitchen losartan (COZAAR) 25 MG tablet Take 1 tablet (25 mg total) by mouth daily.  30 tablet  6  . metFORMIN (GLUCOPHAGE) 1000 MG tablet Take 1,000 mg by mouth 2 (two) times daily.      . metoprolol succinate (TOPROL-XL) 25 MG 24 hr tablet Take 25 mg by mouth daily.      . Multiple Vitamin (MULTIVITAMIN) tablet Take 1 tablet by mouth daily.       . Multiple Vitamins-Minerals (OCUVITE PO) Take by mouth  daily.        . nitroGLYCERIN (NITROSTAT) 0.4 MG SL tablet Place 1 tablet (0.4 mg total) under the tongue every 5 (five) minutes as needed for chest pain (up to 3 doses).  25 tablet  3  . Ticagrelor (BRILINTA) 90 MG TABS tablet Take 1 tablet (90 mg total) by mouth 2 (two) times daily.  60 tablet  6    Allergies  Allergen Reactions  . Amaryl Nausea Only  . Codeine Nausea And Vomiting  . Crestor (Rosuvastatin Calcium)   . Lipitor (Atorvastatin Calcium)     MYALGIAS  . Lovastatin     MYALGIAS   . Niaspan (Niacin Er) Nausea Only  . Sumycin (Tetracycline Hcl)   . Zetia (Ezetimibe)   . Zithromax (Azithromycin)   . Zocor (Simvastatin - High Dose)     MYALGIAS  . Zocor (Simvastatin)     Muscle pain    Patient Active Problem List  Diagnosis  . Neurofibromatosis  . Diabetes mellitus  . HTN (hypertension)  . Obesity  . Osteoarthritis, knee  . Fatigue  . Chest pain  . Acute sinusitis, unspecified  . Herpes zoster  . Dyspnea  . STEMI (ST elevation myocardial infarction)  .  Benign hypertensive heart disease without heart failure    History  Smoking status  . Never Smoker   Smokeless tobacco  . Never Used    History  Alcohol Use No    Family History  Problem Relation Age of Onset  . Heart disease Mother   . Arthritis Mother     Review of Systems: Constitutional: no fever chills diaphoresis or fatigue or change in weight.  Head and neck: no hearing loss, no epistaxis, no photophobia or visual disturbance. Respiratory: No cough, shortness of breath or wheezing. Cardiovascular: No chest pain peripheral edema, palpitations. Gastrointestinal: No abdominal distention, no abdominal pain, no change in bowel habits hematochezia or melena. Genitourinary: No dysuria, no frequency, no urgency, no nocturia. Musculoskeletal:No arthralgias, no back pain, no gait disturbance or myalgias. Neurological: No dizziness, no headaches, no numbness, no seizures, no syncope, no weakness, no  tremors. Hematologic: No lymphadenopathy, no easy bruising. Psychiatric: No confusion, no hallucinations, no sleep disturbance.    Physical Exam: Filed Vitals:   01/21/13 1052  BP: 136/70  Pulse: 67  Resp: 18   the general appearance reveals a well-developed well-nourished woman in no distress.  She has the skin lesions of neurofibromatosis.The head and neck exam reveals pupils equal and reactive.  Extraocular movements are full.  There is no scleral icterus.  The mouth and pharynx are normal.  The neck is supple.  The carotids reveal no bruits.  The jugular venous pressure is normal.  The  thyroid is not enlarged.  There is no lymphadenopathy.  The chest is clear to percussion and auscultation.  There are no rales or rhonchi.  Expansion of the chest is symmetrical.  The precordium is quiet.  The first heart sound is normal.  The second heart sound is physiologically split.  There is no murmur gallop rub or click.  There is no abnormal lift or heave.  The abdomen is soft and nontender.  The bowel sounds are normal.  The liver and spleen are not enlarged.  There are no abdominal masses.  There are no abdominal bruits.  Extremities reveal good pedal pulses.  There is no phlebitis or edema.  There is no cyanosis or clubbing.  Strength is normal and symmetrical in all extremities.  There is no lateralizing weakness.  There are no sensory deficits.  The skin is warm and dry.  There is no rash.  There are cafe au lait spots consistent with neurofibromatosis.     Assessment / Plan: The patient is to continue same medication.  She is requesting that we decrease her Toprol back to once a day and this will be okay.  We are sending her for a chest x-ray today.  We are checking lab work today including a CBC and a hemoglobin A1c and fasting labs. Return in 4 months for an office visit lipid panel hepatic function panel and basal metabolic panel. If her chest x-ray is unremarkable we may want to get a  urinalysis and culture to be sure that her pain is not from her kidney.

## 2013-01-21 NOTE — Assessment & Plan Note (Signed)
The patient has been having some left posterior chest discomfort.  It is not pleuritic.  It is a steady dull discomfort.  We will check a chest x-ray.

## 2013-01-22 ENCOUNTER — Telehealth: Payer: Self-pay | Admitting: *Deleted

## 2013-01-22 NOTE — Telephone Encounter (Signed)
Message copied by Burnell Blanks on Wed Jan 22, 2013  4:33 PM ------      Message from: Cassell Clement      Created: Tue Jan 21, 2013  9:17 PM       Please report to patient.  The recent labs are stable. Continue same medication and careful diet. Lipids are better.  BS 116 better but A1C is 6.9 slightly higher.  Work on diet and weight loss.

## 2013-01-22 NOTE — Telephone Encounter (Signed)
Message copied by Burnell Blanks on Wed Jan 22, 2013  4:32 PM ------      Message from: Cassell Clement      Created: Tue Jan 21, 2013  9:19 PM       Nothing in the chest xray to explain her left posterior chest pain. If it persists we should get UA and culture to be sure the pain is not coming from left kidney.

## 2013-01-22 NOTE — Telephone Encounter (Signed)
Advised patient of lab results and chest xray  

## 2013-02-13 ENCOUNTER — Telehealth: Payer: Self-pay | Admitting: Cardiology

## 2013-02-13 NOTE — Telephone Encounter (Signed)
[facility] requested refill of glimepiride 2mg , now out needs refill asap

## 2013-02-14 ENCOUNTER — Other Ambulatory Visit: Payer: Self-pay | Admitting: *Deleted

## 2013-02-14 MED ORDER — GLIMEPIRIDE 2 MG PO TABS: 4.0000 mg | ORAL_TABLET | Freq: Every day | ORAL | Status: DC

## 2013-02-19 ENCOUNTER — Other Ambulatory Visit: Payer: Self-pay | Admitting: *Deleted

## 2013-02-19 MED ORDER — GLIMEPIRIDE 2 MG PO TABS: 4.0000 mg | ORAL_TABLET | Freq: Every day | ORAL | Status: DC

## 2013-04-09 ENCOUNTER — Ambulatory Visit (INDEPENDENT_AMBULATORY_CARE_PROVIDER_SITE_OTHER): Payer: Medicare Other | Admitting: Emergency Medicine

## 2013-04-09 ENCOUNTER — Ambulatory Visit: Payer: Medicare Other

## 2013-04-09 VITALS — BP 114/76 | HR 89 | Temp 98.2°F | Resp 18 | Ht 62.5 in | Wt 201.0 lb

## 2013-04-09 DIAGNOSIS — J309 Allergic rhinitis, unspecified: Secondary | ICD-10-CM

## 2013-04-09 DIAGNOSIS — M25562 Pain in left knee: Secondary | ICD-10-CM

## 2013-04-09 DIAGNOSIS — M25569 Pain in unspecified knee: Secondary | ICD-10-CM

## 2013-04-09 MED ORDER — MELOXICAM 7.5 MG PO TABS: 7.5000 mg | ORAL_TABLET | Freq: Every day | ORAL | Status: DC

## 2013-04-09 MED ORDER — AMOXICILLIN 500 MG PO CAPS: 500.0000 mg | ORAL_CAPSULE | Freq: Three times a day (TID) | ORAL | Status: DC

## 2013-04-09 NOTE — Progress Notes (Signed)
  Subjective:    Patient ID: [name], female    DOB: 23-Feb-1946, 67 y.o.   MRN: [id number]  HPI 67 yo female here for left knee pain. Has been getting worse for the last month. No known injury. Hurts when she is walking. 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 Takes tylenol for pain, which helps a little bit as well as pain relief cream. Reports medial jointline swelling. No heat or erythema.  Also reports sinus congestion for the last month. Has had thick green mucus. No changes in the last month.   Review of Systems  Constitutional: Negative for fever and chills.  HENT: Positive for congestion and rhinorrhea.   Cardiovascular: Negative.   Musculoskeletal: Positive for joint swelling (L knee).  Skin: Negative.   Allergic/Immunologic: Positive for environmental allergies.       Objective:   Physical Exam  Constitutional: She appears well-developed and well-nourished.  HENT:  Head: Normocephalic and atraumatic.  Right Ear: External ear normal.  Left Ear: External ear normal.  Nose: Nose normal.  Mouth/Throat: Oropharynx is clear and moist.  Neck: Normal range of motion. Neck supple.  Cardiovascular: Normal rate, regular rhythm and normal heart sounds.   Pulmonary/Chest: Effort normal and breath sounds normal.  Musculoskeletal:       Left knee: She exhibits swelling.       Legs: Non-tender. Mild swelling at the medial joint line. Knee is stable. Negative Lachmann and McMurray's tests. Negative anterior and posterior. + crepitus    [facility] reading (PRIMARY) by  Dr. Cleta Alberts there is narrowing of the medial joint space.        Assessment & Plan:  Patient placed on amoxicillin 1 g twice a day for sinusitis. She's also placed on Mobic 7.5 mg 1-2 a day for arthritis

## 2013-04-09 NOTE — Patient Instructions (Signed)
Hot showers 2 times daily or saline nasal spray. Use Amox for the sinus infection until gone. Take Meloxicam for the knee one daily, if not better use 2 daily Wear and Tear Disorders of the Knee (Arthritis, Osteoarthritis) Everyone will experience wear and tear injuries (arthritis, osteoarthritis) of the knee. These are the changes we all get as we age. They come from the joint stress of daily living. The amount of cartilage damage in your knee and your symptoms determine if you need surgery. Mild problems require approximately two months recovery time. More severe problems take several months to recover. With mild problems, your surgeon may find worn and rough cartilage surfaces. With severe changes, your surgeon may find cartilage that has completely worn away and exposed the bone. Loose bodies of bone and cartilage, bone spurs (excess bone growth), and injuries to the menisci (cushions between the large bones of your leg) are also common. All of these problems can cause pain. For a mild wear and tear problem, rough cartilage may simply need to be shaved and smoothed. For more severe problems with areas of exposed bone, your surgeon may use an instrument for roughing up the bone surfaces to stimulate new cartilage growth. Loose bodies are usually removed. Torn menisci may be trimmed or repaired. ABOUT THE ARTHROSCOPIC PROCEDURE Arthroscopy is a surgical technique. It allows your orthopedic surgeon to diagnose and treat your knee injury with accuracy. The surgeon looks into your knee through a small scope. The scope is like a small (pencil-sized) telescope. Arthroscopy is less invasive than open knee surgery. You can expect a more rapid recovery. After the procedure, you will be moved to a recovery area until most of the effects of the medication have worn off. Your caregiver will discuss the test results with you. RECOVERY The severity of the arthritis and the type of procedure performed will determine  recovery time. Other important factors include age, physical condition, medical conditions, and the type of rehabilitation program. Strengthening your muscles after arthroscopy helps guarantee a better recovery. Follow your caregiver's instructions. Use crutches, rest, elevate, ice, and do knee exercises as instructed. Your caregivers will help you and instruct you with exercises and other physical therapy required to regain your mobility, muscle strength, and functioning following surgery. Only take over-the-counter or prescription medicines for pain, discomfort, or fever as directed by your caregiver.  SEEK MEDICAL CARE IF:   There is increased bleeding (more than a small spot) from the wound.  You notice redness, swelling, or increasing pain in the wound.  Pus is coming from wound.  You develop an unexplained oral temperature above 102 F (38.9 C) , or as your caregiver suggests.  You notice a foul smell coming from the wound or dressing.  You have severe pain with motion of the knee. SEEK IMMEDIATE MEDICAL CARE IF:   You develop a rash.  You have difficulty breathing.  You have any allergic problems. MAKE SURE YOU:   Understand these instructions.  Will watch your condition.  Will get help right away if you are not doing well or get worse. Document Released: 12/01/2000 Document Revised: 02/26/2012 Document Reviewed: 04/29/2008 [facility] Patient Information 2013 [place], [place].

## 2013-04-29 ENCOUNTER — Ambulatory Visit (INDEPENDENT_AMBULATORY_CARE_PROVIDER_SITE_OTHER): Payer: Medicare Other | Admitting: Podiatry

## 2013-04-29 ENCOUNTER — Encounter: Payer: Self-pay | Admitting: Podiatry

## 2013-04-29 VITALS — BP 120/67 | HR 93 | Ht 62.5 in | Wt 190.0 lb

## 2013-04-29 DIAGNOSIS — M25579 Pain in unspecified ankle and joints of unspecified foot: Secondary | ICD-10-CM | POA: Insufficient documentation

## 2013-04-29 DIAGNOSIS — B351 Tinea unguium: Secondary | ICD-10-CM

## 2013-04-29 NOTE — Progress Notes (Signed)
Subjective: 67 y.o. year old Type II NIDDM female patient presents complaining of painful nails. Patient requests toe nails trimmed. Stated that she is using antifungal medication for the nails regularly.   Objective: Dermatologic: Thick yellow deformed nails x 10.  Vascular: Pedal pulses are all palpable. Orthopedic: Rectus foot without gross deformities.  Neurologic: All epicritic and tactile sensations grossly intact. Normal positive response to Monofilament sensory testing bilateral.  Assessment: Dystrophic mycotic nails x 10.  Treatment: All mycotic nails debrided.  Patient is to continue using antifungal drops for toe nails.  Return in 3 months or as needed.

## 2013-05-22 ENCOUNTER — Other Ambulatory Visit (INDEPENDENT_AMBULATORY_CARE_PROVIDER_SITE_OTHER): Payer: Medicare Other

## 2013-05-22 ENCOUNTER — Ambulatory Visit (INDEPENDENT_AMBULATORY_CARE_PROVIDER_SITE_OTHER): Payer: Medicare Other | Admitting: Cardiology

## 2013-05-22 ENCOUNTER — Ambulatory Visit: Payer: Medicare Other | Admitting: Cardiology

## 2013-05-22 ENCOUNTER — Encounter: Payer: Self-pay | Admitting: Cardiology

## 2013-05-22 VITALS — BP 124/72 | HR 84 | Ht 63.0 in | Wt 199.8 lb

## 2013-05-22 DIAGNOSIS — I213 ST elevation (STEMI) myocardial infarction of unspecified site: Secondary | ICD-10-CM

## 2013-05-22 DIAGNOSIS — I219 Acute myocardial infarction, unspecified: Secondary | ICD-10-CM

## 2013-05-22 DIAGNOSIS — I119 Hypertensive heart disease without heart failure: Secondary | ICD-10-CM

## 2013-05-22 LAB — BASIC METABOLIC PANEL
CO2: 27 mEq/L (ref 19–32)
Calcium: 9.2 mg/dL (ref 8.4–10.5)
Chloride: 106 mEq/L (ref 96–112)
Creatinine, Ser: 0.7 mg/dL (ref 0.4–1.2)
Sodium: 141 mEq/L (ref 135–145)

## 2013-05-22 LAB — LIPID PANEL
Cholesterol: 195 mg/dL (ref 0–200)
HDL: 39.4 mg/dL (ref >39.00)
LDL Cholesterol: 135 mg/dL — ABNORMAL HIGH (ref 0–99)
Total CHOL/HDL Ratio: 5
Triglycerides: 103 mg/dL (ref 0.0–149.0)

## 2013-05-22 LAB — HEPATIC FUNCTION PANEL
Albumin: 3.5 g/dL (ref 3.5–5.2)
Total Protein: 6.8 g/dL (ref 6.0–8.3)

## 2013-05-22 NOTE — Assessment & Plan Note (Signed)
Blood pressure was remaining stable on current therapy.  No headaches dizziness or syncope. 

## 2013-05-22 NOTE — Assessment & Plan Note (Signed)
Patient has had no recurrent angina pectoris.  We discussed the duration of dual antiplatelet therapy.  She would be able to stop the Brilinta after 12 months but she states that it is not too expensive for her and she would prefer to stay on it longer and this will be okay.  She is not having a problem with bruising or bleeding.

## 2013-05-22 NOTE — Patient Instructions (Addendum)
Will obtain labs today and call you with the results (lp/bmet/hfp/a1c)  Work harder on weight loss and diet  Your physician recommends that you continue on your current medications as directed. Please refer to the Current Medication list given to you today.  Your physician wants you to follow-up in: 4 months with fasting labs (lp/bmet/hfp/a1c) You will receive a reminder letter in the mail two months in advance. If you don't receive a letter, please call our office to schedule the follow-up appointment.

## 2013-05-22 NOTE — Assessment & Plan Note (Signed)
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

## 2013-05-22 NOTE — Progress Notes (Signed)
[name] Date of Birth:  [birth year] [facility] [address] [place], [place]  [address] [phone]         Fax   [phone]  History of Present Illness: This pleasant 67 year old woman is seen for a scheduled followup office visit. She did not have any prior history of known ischemic heart disease. On 05/29/12 she presented with acute chest pain and had a STEMI and was taken to the Cath Lab by Dr. 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 She will be on dual antiplatelet therapy withBrilinta and aspirin for one year. The patient also has a history of hypercholesterolemia, diabetes, and neurofibromatosis. She has a past history of high blood pressure.  Since last visit she has been feeling well.  She has had no recurrent angina.  She has occasional sharp pain under her left breast laterally.   Current Outpatient Prescriptions  Medication Sig Dispense Refill  . aspirin 81 MG tablet Take 1 tablet (81 mg total) by mouth daily.      Marland Kitchen CINNAMON PO Take 1 capsule by mouth daily. OCCASIONALLY      . fish oil-omega-3 fatty acids 1000 MG capsule Take 1 g by mouth daily.       Marland Kitchen glimepiride (AMARYL) 2 MG tablet Take 2 tablets (4 mg total) by mouth daily before breakfast.  60 tablet  5  . losartan (COZAAR) 25 MG tablet Take 1 tablet (25 mg total) by mouth daily.  30 tablet  6  . meloxicam (MOBIC) 7.5 MG tablet Take 1-2 tablets (7.5-15 mg total) by mouth daily.  30 tablet  0  . metFORMIN (GLUCOPHAGE) 1000 MG tablet Take 1,000 mg by mouth 2 (two) times daily.      . metoprolol succinate (TOPROL-XL) 25 MG 24 hr tablet Take 25 mg by mouth daily.      . Multiple Vitamin (MULTIVITAMIN) tablet Take 1 tablet by mouth daily.       . Multiple Vitamins-Minerals (OCUVITE PO) Take by mouth daily.        . nitroGLYCERIN (NITROSTAT) 0.4 MG SL tablet Place 1 tablet (0.4 mg total) under the tongue every 5 (five) minutes as needed for chest pain  (up to 3 doses).  25 tablet  3  . Ticagrelor (BRILINTA) 90 MG TABS tablet Take 1 tablet (90 mg total) by mouth 2 (two) times daily.  60 tablet  6  . amoxicillin (AMOXIL) 500 MG capsule Take 1 capsule (500 mg total) by mouth 3 (three) times daily.  30 capsule  0  . Calcium Carbonate-Vitamin D (CALCIUM + D PO) Take by mouth daily.         No current facility-administered medications for this visit.    Allergies  Allergen Reactions  . Amaryl Nausea Only  . Codeine Nausea And Vomiting  . Crestor (Rosuvastatin Calcium)   . Lipitor (Atorvastatin Calcium)     MYALGIAS  . Lovastatin     MYALGIAS   . Niaspan (Niacin Er) Nausea Only  . Sumycin (Tetracycline Hcl)   . Zetia (Ezetimibe)   . Zithromax (Azithromycin)   . Zocor (Simvastatin - High Dose)     MYALGIAS  . Zocor (Simvastatin)     Muscle pain    Patient Active Problem List   Diagnosis Date Noted  . Fatigue 03/10/2011    Priority: High  . Chest pain 03/10/2011    Priority: High  . Acute sinusitis, unspecified 03/10/2011    Priority: High  .  HTN (hypertension)     Priority: Medium  . Onychomycosis 04/29/2013  . Pain in joint, ankle and foot 04/29/2013  . Benign hypertensive heart disease without heart failure 10/15/2012  . STEMI (ST elevation myocardial infarction) 05/30/2012  . Dyspnea 11/15/2011  . Herpes zoster 07/17/2011  . Neurofibromatosis   . Type II or unspecified type diabetes mellitus without mention of complication, uncontrolled   . Obesity   . Osteoarthritis, knee     History  Smoking status  . Never Smoker   Smokeless tobacco  . Never Used    History  Alcohol Use No    Family History  Problem Relation Age of Onset  . Heart disease Mother   . Arthritis Mother   . Stroke Mother   . Heart disease Son     Review of Systems: Constitutional: no fever chills diaphoresis or fatigue or change in weight.  Head and neck: no hearing loss, no epistaxis, no photophobia or visual  disturbance. Respiratory: No cough, shortness of breath or wheezing. Cardiovascular: No chest pain peripheral edema, palpitations. Gastrointestinal: No abdominal distention, no abdominal pain, no change in bowel habits hematochezia or melena. Genitourinary: No dysuria, no frequency, no urgency, no nocturia. Musculoskeletal:No arthralgias, no back pain, no gait disturbance or myalgias. Neurological: No dizziness, no headaches, no numbness, no seizures, no syncope, no weakness, no tremors. Hematologic: No lymphadenopathy, no easy bruising. Psychiatric: No confusion, no hallucinations, no sleep disturbance.    Physical Exam: Filed Vitals:   05/22/13 1031  BP: 124/72  Pulse: 84   the general appearance reveals a well-developed well-nourished woman in no distress.  She has the typical skin lesions of neurofibromatosis.The head and neck exam reveals pupils equal and reactive.  Extraocular movements are full.  There is no scleral icterus.  The mouth and pharynx are normal.  The neck is supple.  The carotids reveal no bruits.  The jugular venous pressure is normal.  The  thyroid is not enlarged.  There is no lymphadenopathy.  The chest is clear to percussion and auscultation.  There are no rales or rhonchi.  Expansion of the chest is symmetrical.  The precordium is quiet.  The first heart sound is normal.  The second heart sound is physiologically split.  There is no murmur gallop rub or click.  There is no abnormal lift or heave.  The abdomen is soft and nontender.  The bowel sounds are normal.  The liver and spleen are not enlarged.  There are no abdominal masses.  There are no abdominal bruits.  Extremities reveal good pedal pulses.  There is no phlebitis or edema.  There is no cyanosis or clubbing.  Strength is normal and symmetrical in all extremities.  There is no lateralizing weakness.  There are no sensory deficits.  The skin is warm and dry.  There is no rash.     Assessment / Plan: Continue  same medication.  Try to lose weight.  Recheck in 4 months for followup office visit lipid panel hepatic function panel basal metabolic panel and hemoglobin Z6X.  She has not been having any hypoglycemic symptoms and her blood sugars generally are in the range of 160 when she checks them at home.

## 2013-05-23 NOTE — Progress Notes (Signed)
Quick Note:  Please report to patient. The recent labs are stable. Continue same medication and careful diet. Cholesterol is better. Liver tests are satisfactory. Blood sugar 108. Continue same medication ______

## 2013-05-23 NOTE — Progress Notes (Signed)
Quick Note:  Please report to patient. The recent labs are stable. Continue same medication and careful diet. Hemoglobin A1c is elevated at 7.2, higher than last time at 6.9 ______

## 2013-05-26 ENCOUNTER — Telehealth: Payer: Self-pay | Admitting: *Deleted

## 2013-05-26 NOTE — Telephone Encounter (Signed)
Advised patient of lab results and called in One touch ultra test strips twice a day #100

## 2013-05-26 NOTE — Telephone Encounter (Signed)
Message copied by Burnell Blanks on Mon May 26, 2013  5:07 PM ------      Message from: Cassell Clement      Created: Fri May 23, 2013 12:17 PM       Please report to patient.  The recent labs are stable. Continue same medication and careful diet.  Hemoglobin A1c is elevated at 7.2, higher than last time at 6.9 ------

## 2013-05-26 NOTE — Telephone Encounter (Signed)
Message copied by Burnell Blanks on Mon May 26, 2013  5:08 PM ------      Message from: Cassell Clement      Created: Fri May 23, 2013 12:18 PM       Please report to patient.  The recent labs are stable. Continue same medication and careful diet.  Cholesterol is better.  Liver tests are satisfactory.  Blood sugar 108.  Continue same medication ------

## 2013-05-29 ENCOUNTER — Other Ambulatory Visit: Payer: Self-pay | Admitting: *Deleted

## 2013-05-29 MED ORDER — GLUCOSE BLOOD VI STRP: ORAL_STRIP | Status: DC

## 2013-07-01 ENCOUNTER — Other Ambulatory Visit: Payer: Self-pay | Admitting: *Deleted

## 2013-07-01 MED ORDER — NITROGLYCERIN 0.4 MG SL SUBL: 0.4000 mg | SUBLINGUAL_TABLET | SUBLINGUAL | Status: DC | PRN

## 2013-07-14 ENCOUNTER — Other Ambulatory Visit: Payer: Self-pay | Admitting: *Deleted

## 2013-07-14 MED ORDER — METFORMIN HCL 1000 MG PO TABS: 1000.0000 mg | ORAL_TABLET | Freq: Two times a day (BID) | ORAL | Status: DC

## 2013-07-27 ENCOUNTER — Other Ambulatory Visit: Payer: Self-pay | Admitting: Cardiology

## 2013-07-28 ENCOUNTER — Encounter: Payer: Self-pay | Admitting: Cardiology

## 2013-08-20 ENCOUNTER — Telehealth: Payer: Self-pay | Admitting: Cardiology

## 2013-08-20 NOTE — Telephone Encounter (Signed)
Walk In pt Form " [facility] Clearance" paper Dropped Off Gave to [facility]  08/20/13/KM

## 2013-08-21 ENCOUNTER — Telehealth: Payer: Self-pay

## 2013-08-21 ENCOUNTER — Telehealth: Payer: Self-pay | Admitting: Cardiology

## 2013-08-21 NOTE — Telephone Encounter (Signed)
PT STATES DR DAUB WAS TO FILL OUT SOME FORMS OKAYING FOR HER TO HAVE KNEE SURGERY WITH DR Valentina Gu AND SHE REALLY NEED THEM ASAP SO SHE CAN TAKE OVER TO HIS OFFICE PLEASE CALL [phone]

## 2013-08-21 NOTE — Telephone Encounter (Signed)
Clearance Faxed to [facility], pt called and Made aware Original mailed to Pt 08/21/13/KM

## 2013-08-21 NOTE — Telephone Encounter (Signed)
She needs to come in to see me tomorrow or Monday so we can get the status of her diabetes. As noted she will also need referral to Dr. Patty Sermons and he will need to get cardiac clearance because of her history of an MI.

## 2013-08-21 NOTE — Telephone Encounter (Signed)
Pt has called back wondering if her papers were ready to be picked up about her surgery. Call back number is [phone]

## 2013-08-21 NOTE — Telephone Encounter (Signed)
Dr Patty Sermons has request for Cardiac clearance, can you give medical clearance? Last office visit in April

## 2013-08-21 NOTE — Telephone Encounter (Signed)
Called to advise she will need office visit for surgical clearance left detailed mssg.

## 2013-08-22 ENCOUNTER — Ambulatory Visit (INDEPENDENT_AMBULATORY_CARE_PROVIDER_SITE_OTHER): Payer: Medicare Other | Admitting: Emergency Medicine

## 2013-08-22 VITALS — BP 118/74 | HR 88 | Temp 97.7°F | Resp 17 | Ht 62.5 in | Wt 199.0 lb

## 2013-08-22 DIAGNOSIS — E119 Type 2 diabetes mellitus without complications: Secondary | ICD-10-CM

## 2013-08-22 LAB — POCT GLYCOSYLATED HEMOGLOBIN (HGB A1C): Hemoglobin A1C: 6.8

## 2013-08-22 NOTE — Progress Notes (Signed)
  Subjective:    Patient ID: [name], female    DOB: 27-Aug-1946, 67 y.o.   MRN: [id number]  HPI patient needs surgical clearance for upcoming left total knee replacement she has a history of diabetes and is on medications for this. She's had a previous myocardial infarction and has been cleared by cardiology for her upcoming surgery. Her diabetes has been under fair control and she has had no diabetic complications besides her coronary disease.    Review of Systems     Objective:   Physical Exam patient is alert and cooperative. Her neck is supple. Her chest is clear to auscultation and percussion. Heart is regular rate without murmurs. Abdomen is protuberant no masses. She has multiple neurofibromas on her skin.  Results for orders placed in visit on 05/22/13  LIPID PANEL      Result Value Range   Cholesterol 195  0 - 200 mg/dL   Triglycerides 086.5  0.0 - 149.0 mg/dL   HDL 78.46  >96.29 mg/dL   VLDL 52.8  0.0 - 41.3 mg/dL   LDL Cholesterol 244 (*) 0 - 99 mg/dL   Total CHOL/HDL Ratio 5    HEPATIC FUNCTION PANEL      Result Value Range   Total Bilirubin 0.8  0.3 - 1.2 mg/dL   Bilirubin, Direct 0.0  0.0 - 0.3 mg/dL   Alkaline Phosphatase 50  39 - 117 U/L   AST 22  0 - 37 U/L   ALT 20  0 - 35 U/L   Total Protein 6.8  6.0 - 8.3 g/dL   Albumin 3.5  3.5 - 5.2 g/dL  BASIC METABOLIC PANEL      Result Value Range   Sodium 141  135 - 145 mEq/L   Potassium 4.4  3.5 - 5.1 mEq/L   Chloride 106  96 - 112 mEq/L   CO2 27  19 - 32 mEq/L   Glucose, Bld 108 (*) 70 - 99 mg/dL   BUN 13  6 - 23 mg/dL   Creatinine, Ser 0.7  0.4 - 1.2 mg/dL   Calcium 9.2  8.4 - 01.0 mg/dL   GFR 27.25  >36.64 mL/min   Results for orders placed in visit on 08/22/13  POCT GLYCOSYLATED HEMOGLOBIN (HGB A1C)      Result Value Range   Hemoglobin A1C 6.8        Assessment & Plan:  Hemoglobin A1c is 6.8. She is medically cleared for surgery. She has had previous clearance from her cardiologist .

## 2013-08-25 ENCOUNTER — Telehealth: Payer: Self-pay

## 2013-08-25 NOTE — Telephone Encounter (Signed)
Spoke with patient, advised her of her A1C and BP.  Patient states understanding.

## 2013-08-25 NOTE — Telephone Encounter (Signed)
Called her to advise, left message for her to call me back 

## 2013-08-25 NOTE — Telephone Encounter (Signed)
6.8 on 08/22/13

## 2013-08-25 NOTE — Telephone Encounter (Signed)
PT DIDN'T GET HER TEST RESULTS, BUT WOULD LIKE TO KNOW WHAT HER AIC IS. PLEASE CALL [phone]

## 2013-09-01 ENCOUNTER — Other Ambulatory Visit: Payer: Self-pay | Admitting: Orthopedic Surgery

## 2013-09-04 ENCOUNTER — Encounter (HOSPITAL_COMMUNITY): Payer: Self-pay | Admitting: Pharmacy Technician

## 2013-09-05 ENCOUNTER — Telehealth: Payer: Self-pay | Admitting: Cardiology

## 2013-09-05 ENCOUNTER — Other Ambulatory Visit: Payer: Self-pay | Admitting: Orthopedic Surgery

## 2013-09-05 NOTE — Telephone Encounter (Signed)
Her heart attack was in June 2013.  She has now been on Brilinta for more than 12 months.  She may stop the Brilinta now if she desires.  She will continue to take the baby aspirin indefinitely

## 2013-09-05 NOTE — Telephone Encounter (Signed)
Will forward to  Dr. Brackbill for review 

## 2013-09-05 NOTE — Telephone Encounter (Signed)
New Problem  Want to know when to stop taking brilinta... Does she continue to take the Asprin everyday.

## 2013-09-05 NOTE — Telephone Encounter (Signed)
Left message to call back  

## 2013-09-08 ENCOUNTER — Encounter (HOSPITAL_COMMUNITY): Payer: Self-pay

## 2013-09-08 ENCOUNTER — Encounter (HOSPITAL_COMMUNITY)
Admission: RE | Admit: 2013-09-08 | Discharge: 2013-09-08 | Disposition: A | Discharge status: Home / Self Care | Payer: Medicare Other | Source: Ambulatory Visit | Attending: Orthopedic Surgery | Admitting: Orthopedic Surgery

## 2013-09-08 DIAGNOSIS — Z01818 Encounter for other preprocedural examination: Secondary | ICD-10-CM | POA: Insufficient documentation

## 2013-09-08 DIAGNOSIS — Z01812 Encounter for preprocedural laboratory examination: Secondary | ICD-10-CM | POA: Insufficient documentation

## 2013-09-08 LAB — CBC WITH DIFFERENTIAL/PLATELET
Basophils Relative: 1 % (ref 0–1)
Hemoglobin: 13.5 g/dL (ref 12.0–15.0)
Lymphs Abs: 1.8 10*3/uL (ref 0.7–4.0)
Monocytes Relative: 11 % (ref 3–12)
Neutro Abs: 4.2 10*3/uL (ref 1.7–7.7)
Neutrophils Relative %: 60 % (ref 43–77)
RBC: 4.92 MIL/uL (ref 3.87–5.11)

## 2013-09-08 LAB — URINALYSIS, ROUTINE W REFLEX MICROSCOPIC
Bilirubin Urine: NEGATIVE
Glucose, UA: NEGATIVE mg/dL
Hgb urine dipstick: NEGATIVE
Nitrite: NEGATIVE
Specific Gravity, Urine: 1.014 (ref 1.005–1.030)
Urobilinogen, UA: 0.2 mg/dL (ref 0.0–1.0)
pH: 5 (ref 5.0–8.0)

## 2013-09-08 LAB — ABO/RH: ABO/RH(D): B POS

## 2013-09-08 LAB — URINE MICROSCOPIC-ADD ON

## 2013-09-08 LAB — COMPREHENSIVE METABOLIC PANEL
ALT: 21 U/L (ref 0–35)
Albumin: 3.6 g/dL (ref 3.5–5.2)
Alkaline Phosphatase: 64 U/L (ref 39–117)
BUN: 16 mg/dL (ref 6–23)
CO2: 27 mEq/L (ref 19–32)
Chloride: 99 mEq/L (ref 96–112)
Creatinine, Ser: 0.7 mg/dL (ref 0.50–1.10)
GFR calc Af Amer: >90 mL/min (ref >90)
Potassium: 4.5 mEq/L (ref 3.5–5.1)
Sodium: 135 mEq/L (ref 135–145)
Total Bilirubin: 0.3 mg/dL (ref 0.3–1.2)

## 2013-09-08 LAB — APTT: aPTT: 25 seconds (ref 24–37)

## 2013-09-08 LAB — PROTIME-INR: Prothrombin Time: 12.9 seconds (ref 11.6–15.2)

## 2013-09-08 LAB — SURGICAL PCR SCREEN: Staphylococcus aureus: NEGATIVE

## 2013-09-08 NOTE — Pre-Procedure Instructions (Signed)
[name]  09/08/2013   Your procedure is scheduled on:  09-15-2013  Monday   Report to [facility] Stay [facility] at 5:30 AM.  Call this number if you have problems the morning of surgery: [phone]   Remember:   Do not eat food or drink liquids after midnight.    Take these medicines the morning of surgery with A SIP OF WATER: none              NO DIABETIC  MEDICATIONS THE MORNING OF SURGERY   Do not wear jewelry, make-up or nail polish.  Do not wear lotions, powders, or perfumes.   Do not shave 48 hours prior to surgery.   Do not bring valuables to the hospital.  [facility] is not responsible for any belongings or valuables.  Contacts, dentures or bridgework may not be worn into surgery.  Leave suitcase in the car. After surgery it may be brought to your room.   For patients admitted to the hospital, checkout time is 11:00 AM the day of discharge.   Patients discharged the day of surgery will not be allowed to drive ome.    Special Instructions: Shower using CHG 2 nights before surgery and the night before surgery.  If you shower the day of surgery use CHG.  Use special wash - you have one bottle of CHG for all showers.  You should use approximately 1/3 of the bottle for each shower.   Please read over the following fact sheets that you were given: Pain Booklet, Coughing and Deep Breathing, Blood Transfusion Information and Surgical Site Infection Prevention

## 2013-09-08 NOTE — Telephone Encounter (Signed)
Follow up ° ° ° ° °Returning Melinda's call °

## 2013-09-08 NOTE — Telephone Encounter (Signed)
Advised patient

## 2013-09-09 ENCOUNTER — Telehealth: Payer: Self-pay | Admitting: Cardiology

## 2013-09-09 LAB — URINE CULTURE: Colony Count: >=100000

## 2013-09-09 NOTE — Telephone Encounter (Signed)
Pt called to make sure she can take her BP medication losartan in the AM and metoprolol in the PM prior surgery. Pt is scheduled for Knee replacement on 09/15/13. Pt is aware that she is to continue taking her BP medications. Pt verbalized understanding.

## 2013-09-09 NOTE — Telephone Encounter (Signed)
Pt went for pre op yesterday and was told to ask if she should take her BP pills , on two, prior to surgery, pls advise [phone]

## 2013-09-11 ENCOUNTER — Telehealth: Payer: Self-pay | Admitting: Cardiology

## 2013-09-11 NOTE — Telephone Encounter (Signed)
agree

## 2013-09-11 NOTE — Telephone Encounter (Signed)
Left message to call back  

## 2013-09-11 NOTE — Telephone Encounter (Signed)
Patient does not have a PCP but sees Dr Cleta Alberts in emergency situations. Advised to try Mucinex and would forward to  Dr. Patty Sermons for review but he was out of the office until Monday.

## 2013-09-11 NOTE — Telephone Encounter (Signed)
New problem:  Pt states she has a sinus infection and she is very congested. Pt states she is having surgery on Monday and she was taken off her meds b/c of the upcoming surgery. Pt would like to know what she can take?

## 2013-09-12 ENCOUNTER — Other Ambulatory Visit: Payer: Self-pay | Admitting: Cardiology

## 2013-09-12 NOTE — Telephone Encounter (Signed)
Advised patient, if worse go to see Dr Cleta Alberts

## 2013-09-14 MED ORDER — TRANEXAMIC ACID 100 MG/ML IV SOLN
1000.0000 mg | INTRAVENOUS | Status: AC
  Administered 2013-09-15: 1000 mg via INTRAVENOUS
  Filled 2013-09-14: qty 10

## 2013-09-14 MED ORDER — CEFAZOLIN SODIUM-DEXTROSE 2-3 GM-% IV SOLR
2.0000 g | INTRAVENOUS | Status: AC
  Administered 2013-09-15: 2 g via INTRAVENOUS
  Filled 2013-09-14: qty 50

## 2013-09-15 ENCOUNTER — Encounter (HOSPITAL_COMMUNITY): Payer: Self-pay | Admitting: *Deleted

## 2013-09-15 ENCOUNTER — Inpatient Hospital Stay (HOSPITAL_COMMUNITY): Payer: Medicare Other | Admitting: Vascular Surgery

## 2013-09-15 ENCOUNTER — Inpatient Hospital Stay (HOSPITAL_COMMUNITY)
Admission: RE | Admit: 2013-09-15 | Discharge: 2013-09-22 | DRG: 470 | Disposition: A | Discharge status: Home Health | Payer: Medicare Other | Source: Ambulatory Visit | Attending: Orthopedic Surgery | Admitting: Orthopedic Surgery

## 2013-09-15 ENCOUNTER — Encounter (HOSPITAL_COMMUNITY)
Admission: RE | Disposition: A | Discharge status: Home Health | Payer: Self-pay | Source: Ambulatory Visit | Attending: Orthopedic Surgery

## 2013-09-15 ENCOUNTER — Encounter (HOSPITAL_COMMUNITY): Payer: Self-pay | Admitting: Vascular Surgery

## 2013-09-15 DIAGNOSIS — Z881 Allergy status to other antibiotic agents status: Secondary | ICD-10-CM

## 2013-09-15 DIAGNOSIS — Z79899 Other long term (current) drug therapy: Secondary | ICD-10-CM

## 2013-09-15 DIAGNOSIS — E785 Hyperlipidemia, unspecified: Secondary | ICD-10-CM | POA: Diagnosis present

## 2013-09-15 DIAGNOSIS — R112 Nausea with vomiting, unspecified: Secondary | ICD-10-CM | POA: Diagnosis not present

## 2013-09-15 DIAGNOSIS — I119 Hypertensive heart disease without heart failure: Secondary | ICD-10-CM | POA: Diagnosis present

## 2013-09-15 DIAGNOSIS — Z8261 Family history of arthritis: Secondary | ICD-10-CM

## 2013-09-15 DIAGNOSIS — Q8500 Neurofibromatosis, unspecified: Secondary | ICD-10-CM | POA: Diagnosis present

## 2013-09-15 DIAGNOSIS — I10 Essential (primary) hypertension: Secondary | ICD-10-CM

## 2013-09-15 DIAGNOSIS — Z7982 Long term (current) use of aspirin: Secondary | ICD-10-CM

## 2013-09-15 DIAGNOSIS — IMO0001 Reserved for inherently not codable concepts without codable children: Secondary | ICD-10-CM | POA: Diagnosis present

## 2013-09-15 DIAGNOSIS — I2699 Other pulmonary embolism without acute cor pulmonale: Secondary | ICD-10-CM

## 2013-09-15 DIAGNOSIS — M1710 Unilateral primary osteoarthritis, unspecified knee: Principal | ICD-10-CM | POA: Diagnosis present

## 2013-09-15 DIAGNOSIS — I252 Old myocardial infarction: Secondary | ICD-10-CM

## 2013-09-15 DIAGNOSIS — Z96652 Presence of left artificial knee joint: Secondary | ICD-10-CM

## 2013-09-15 DIAGNOSIS — I2510 Atherosclerotic heart disease of native coronary artery without angina pectoris: Secondary | ICD-10-CM | POA: Diagnosis present

## 2013-09-15 DIAGNOSIS — E669 Obesity, unspecified: Secondary | ICD-10-CM | POA: Diagnosis present

## 2013-09-15 DIAGNOSIS — K219 Gastro-esophageal reflux disease without esophagitis: Secondary | ICD-10-CM | POA: Diagnosis present

## 2013-09-15 DIAGNOSIS — Z9861 Coronary angioplasty status: Secondary | ICD-10-CM

## 2013-09-15 DIAGNOSIS — R0902 Hypoxemia: Secondary | ICD-10-CM

## 2013-09-15 DIAGNOSIS — Z8249 Family history of ischemic heart disease and other diseases of the circulatory system: Secondary | ICD-10-CM

## 2013-09-15 DIAGNOSIS — R079 Chest pain, unspecified: Secondary | ICD-10-CM

## 2013-09-15 DIAGNOSIS — Z888 Allergy status to other drugs, medicaments and biological substances status: Secondary | ICD-10-CM

## 2013-09-15 DIAGNOSIS — Z23 Encounter for immunization: Secondary | ICD-10-CM

## 2013-09-15 DIAGNOSIS — Z7901 Long term (current) use of anticoagulants: Secondary | ICD-10-CM

## 2013-09-15 DIAGNOSIS — Z6836 Body mass index (BMI) 36.0-36.9, adult: Secondary | ICD-10-CM

## 2013-09-15 DIAGNOSIS — D62 Acute posthemorrhagic anemia: Secondary | ICD-10-CM | POA: Diagnosis not present

## 2013-09-15 DIAGNOSIS — Z823 Family history of stroke: Secondary | ICD-10-CM

## 2013-09-15 HISTORY — PX: TOTAL KNEE ARTHROPLASTY: SHX125

## 2013-09-15 LAB — GLUCOSE, CAPILLARY
Glucose-Capillary: 159 mg/dL — ABNORMAL HIGH (ref 70–99)
Glucose-Capillary: 203 mg/dL — ABNORMAL HIGH (ref 70–99)
Glucose-Capillary: 213 mg/dL — ABNORMAL HIGH (ref 70–99)
Glucose-Capillary: 211 mg/dL — ABNORMAL HIGH (ref 70–99)

## 2013-09-15 LAB — CBC
HCT: 34.8 % — ABNORMAL LOW (ref 36.0–46.0)
Hemoglobin: 11.9 g/dL — ABNORMAL LOW (ref 12.0–15.0)
MCH: 28.1 pg (ref 26.0–34.0)
MCHC: 34.2 g/dL (ref 30.0–36.0)
RBC: 4.24 MIL/uL (ref 3.87–5.11)
WBC: 10.6 10*3/uL — ABNORMAL HIGH (ref 4.0–10.5)

## 2013-09-15 LAB — CREATININE, SERUM
Creatinine, Ser: 0.85 mg/dL (ref 0.50–1.10)
GFR calc Af Amer: 81 mL/min — ABNORMAL LOW (ref >90)
GFR calc non Af Amer: 70 mL/min — ABNORMAL LOW (ref >90)

## 2013-09-15 SURGERY — ARTHROPLASTY, KNEE, TOTAL
Anesthesia: General | Laterality: Left | Wound class: Clean

## 2013-09-15 MED ORDER — METHOCARBAMOL 500 MG PO TABS
500.0000 mg | ORAL_TABLET | Freq: Four times a day (QID) | ORAL | Status: DC | PRN
  Administered 2013-09-15 – 2013-09-22 (×8): 500 mg via ORAL
  Filled 2013-09-15 (×8): qty 1

## 2013-09-15 MED ORDER — MENTHOL 3 MG MT LOZG
1.0000 | LOZENGE | OROMUCOSAL | Status: DC | PRN
  Administered 2013-09-15 – 2013-09-17 (×2): 3 mg via ORAL
  Filled 2013-09-15 (×2): qty 9

## 2013-09-15 MED ORDER — HYDROMORPHONE HCL PF 1 MG/ML IJ SOLN
1.0000 mg | INTRAMUSCULAR | Status: DC | PRN
  Administered 2013-09-15: 1 mg via INTRAVENOUS
  Filled 2013-09-15: qty 1

## 2013-09-15 MED ORDER — MIDAZOLAM HCL 5 MG/5ML IJ SOLN
INTRAMUSCULAR | Status: DC | PRN
  Administered 2013-09-15 (×2): 1 mg via INTRAVENOUS

## 2013-09-15 MED ORDER — DOCUSATE SODIUM 100 MG PO CAPS
100.0000 mg | ORAL_CAPSULE | Freq: Two times a day (BID) | ORAL | Status: DC
  Administered 2013-09-15 – 2013-09-22 (×14): 100 mg via ORAL
  Filled 2013-09-15 (×15): qty 1

## 2013-09-15 MED ORDER — OXYCODONE HCL ER 10 MG PO T12A
10.0000 mg | EXTENDED_RELEASE_TABLET | Freq: Two times a day (BID) | ORAL | Status: DC
  Administered 2013-09-15 – 2013-09-16 (×3): 10 mg via ORAL
  Filled 2013-09-15 (×3): qty 1

## 2013-09-15 MED ORDER — SODIUM CHLORIDE 0.9 % IV SOLN
INTRAVENOUS | Status: DC
  Administered 2013-09-22: 08:00:00 via INTRAVENOUS

## 2013-09-15 MED ORDER — ACETAMINOPHEN 325 MG PO TABS
650.0000 mg | ORAL_TABLET | Freq: Four times a day (QID) | ORAL | Status: DC | PRN
  Administered 2013-09-16: 650 mg via ORAL
  Filled 2013-09-15: qty 2

## 2013-09-15 MED ORDER — METFORMIN HCL 500 MG PO TABS
1000.0000 mg | ORAL_TABLET | Freq: Two times a day (BID) | ORAL | Status: DC
  Administered 2013-09-15 – 2013-09-22 (×15): 1000 mg via ORAL
  Filled 2013-09-15 (×16): qty 2

## 2013-09-15 MED ORDER — FLEET ENEMA 7-19 GM/118ML RE ENEM: 1.0000 | ENEMA | Freq: Once | RECTAL | Status: AC | PRN

## 2013-09-15 MED ORDER — ENOXAPARIN SODIUM 30 MG/0.3ML ~~LOC~~ SOLN
30.0000 mg | Freq: Two times a day (BID) | SUBCUTANEOUS | Status: DC
  Administered 2013-09-16 – 2013-09-17 (×3): 30 mg via SUBCUTANEOUS
  Filled 2013-09-15 (×5): qty 0.3

## 2013-09-15 MED ORDER — ACETAMINOPHEN 650 MG RE SUPP: 650.0000 mg | Freq: Four times a day (QID) | RECTAL | Status: DC | PRN

## 2013-09-15 MED ORDER — ASPIRIN EC 81 MG PO TBEC
81.0000 mg | DELAYED_RELEASE_TABLET | Freq: Every day | ORAL | Status: DC
  Filled 2013-09-15: qty 1

## 2013-09-15 MED ORDER — BUPIVACAINE LIPOSOME 1.3 % IJ SUSP
20.0000 mL | Freq: Once | INTRAMUSCULAR | Status: DC
  Filled 2013-09-15: qty 20

## 2013-09-15 MED ORDER — BISACODYL 5 MG PO TBEC: 5.0000 mg | DELAYED_RELEASE_TABLET | Freq: Every day | ORAL | Status: DC | PRN

## 2013-09-15 MED ORDER — ASPIRIN EC 81 MG PO TBEC
81.0000 mg | DELAYED_RELEASE_TABLET | Freq: Every day | ORAL | Status: DC
  Administered 2013-09-16 – 2013-09-22 (×7): 81 mg via ORAL
  Filled 2013-09-15 (×7): qty 1

## 2013-09-15 MED ORDER — CHLORHEXIDINE GLUCONATE 4 % EX LIQD: 60.0000 mL | Freq: Once | CUTANEOUS | Status: DC

## 2013-09-15 MED ORDER — PHENOL 1.4 % MT LIQD: 1.0000 | OROMUCOSAL | Status: DC | PRN

## 2013-09-15 MED ORDER — GLYCOPYRROLATE 0.2 MG/ML IJ SOLN
INTRAMUSCULAR | Status: DC | PRN
  Administered 2013-09-15: 0.6 mg via INTRAVENOUS

## 2013-09-15 MED ORDER — OXYCODONE HCL 5 MG PO TABS: 5.0000 mg | ORAL_TABLET | Freq: Once | ORAL | Status: DC | PRN

## 2013-09-15 MED ORDER — BUPIVACAINE LIPOSOME 1.3 % IJ SUSP
20.0000 mL | INTRAMUSCULAR | Status: DC
  Filled 2013-09-15: qty 20

## 2013-09-15 MED ORDER — FENTANYL CITRATE 0.05 MG/ML IJ SOLN
INTRAMUSCULAR | Status: DC | PRN
  Administered 2013-09-15: 100 ug via INTRAVENOUS
  Administered 2013-09-15: 50 ug via INTRAVENOUS
  Administered 2013-09-15: 100 ug via INTRAVENOUS
  Administered 2013-09-15: 50 ug via INTRAVENOUS

## 2013-09-15 MED ORDER — ONDANSETRON HCL 4 MG PO TABS
4.0000 mg | ORAL_TABLET | Freq: Four times a day (QID) | ORAL | Status: DC | PRN
  Administered 2013-09-16 – 2013-09-21 (×5): 4 mg via ORAL
  Filled 2013-09-15 (×5): qty 1

## 2013-09-15 MED ORDER — BUPIVACAINE HCL (PF) 0.5 % IJ SOLN
INTRAMUSCULAR | Status: AC
  Filled 2013-09-15: qty 30

## 2013-09-15 MED ORDER — ROPIVACAINE HCL 5 MG/ML IJ SOLN
INTRAMUSCULAR | Status: DC | PRN
  Administered 2013-09-15: 20 mL

## 2013-09-15 MED ORDER — BUPIVACAINE HCL (PF) 0.5 % IJ SOLN
INTRAMUSCULAR | Status: DC | PRN
  Administered 2013-09-15: 30 mL via INTRA_ARTICULAR

## 2013-09-15 MED ORDER — DEXAMETHASONE SODIUM PHOSPHATE 4 MG/ML IJ SOLN
INTRAMUSCULAR | Status: DC | PRN
  Administered 2013-09-15: 4 mg via INTRAVENOUS

## 2013-09-15 MED ORDER — METOPROLOL SUCCINATE ER 25 MG PO TB24
25.0000 mg | ORAL_TABLET | Freq: Every day | ORAL | Status: DC
  Administered 2013-09-15 – 2013-09-21 (×7): 25 mg via ORAL
  Filled 2013-09-15 (×8): qty 1

## 2013-09-15 MED ORDER — GLIMEPIRIDE 4 MG PO TABS
4.0000 mg | ORAL_TABLET | Freq: Every day | ORAL | Status: DC
  Administered 2013-09-16 – 2013-09-22 (×7): 4 mg via ORAL
  Filled 2013-09-15 (×8): qty 1

## 2013-09-15 MED ORDER — ROCURONIUM BROMIDE 100 MG/10ML IV SOLN
INTRAVENOUS | Status: DC | PRN
  Administered 2013-09-15: 50 mg via INTRAVENOUS

## 2013-09-15 MED ORDER — OXYCODONE HCL 5 MG PO TABS
5.0000 mg | ORAL_TABLET | ORAL | Status: DC | PRN
  Administered 2013-09-15 (×3): 5 mg via ORAL
  Administered 2013-09-16 (×2): 10 mg via ORAL
  Filled 2013-09-15: qty 2
  Filled 2013-09-15 (×2): qty 1
  Filled 2013-09-15: qty 2
  Filled 2013-09-15: qty 1

## 2013-09-15 MED ORDER — ONDANSETRON HCL 4 MG/2ML IJ SOLN
4.0000 mg | Freq: Four times a day (QID) | INTRAMUSCULAR | Status: DC | PRN
  Administered 2013-09-15 – 2013-09-20 (×5): 4 mg via INTRAVENOUS
  Filled 2013-09-15 (×5): qty 2

## 2013-09-15 MED ORDER — LACTATED RINGERS IV SOLN
INTRAVENOUS | Status: DC | PRN
  Administered 2013-09-15 (×2): via INTRAVENOUS

## 2013-09-15 MED ORDER — ALBUTEROL SULFATE HFA 108 (90 BASE) MCG/ACT IN AERS
INHALATION_SPRAY | RESPIRATORY_TRACT | Status: DC | PRN
  Administered 2013-09-15 (×2): 5 via RESPIRATORY_TRACT

## 2013-09-15 MED ORDER — DIPHENHYDRAMINE HCL 12.5 MG/5ML PO ELIX
12.5000 mg | ORAL_SOLUTION | ORAL | Status: DC | PRN
  Administered 2013-09-18 – 2013-09-22 (×3): 25 mg via ORAL
  Filled 2013-09-15 (×3): qty 10

## 2013-09-15 MED ORDER — NITROGLYCERIN 0.4 MG SL SUBL: 0.4000 mg | SUBLINGUAL_TABLET | SUBLINGUAL | Status: DC | PRN

## 2013-09-15 MED ORDER — METOCLOPRAMIDE HCL 10 MG PO TABS
5.0000 mg | ORAL_TABLET | Freq: Three times a day (TID) | ORAL | Status: DC | PRN
  Filled 2013-09-15: qty 1

## 2013-09-15 MED ORDER — INSULIN ASPART 100 UNIT/ML ~~LOC~~ SOLN
0.0000 [IU] | Freq: Three times a day (TID) | SUBCUTANEOUS | Status: DC
Start: 2013-09-15 — End: 2013-09-22
  Administered 2013-09-15 – 2013-09-16 (×3): 5 [IU] via SUBCUTANEOUS
  Administered 2013-09-16 – 2013-09-17 (×3): 3 [IU] via SUBCUTANEOUS
  Administered 2013-09-17: 5 [IU] via SUBCUTANEOUS
  Administered 2013-09-17: 3 [IU] via SUBCUTANEOUS
  Administered 2013-09-18: 2 [IU] via SUBCUTANEOUS
  Administered 2013-09-18 – 2013-09-19 (×3): 3 [IU] via SUBCUTANEOUS
  Administered 2013-09-19 (×2): 2 [IU] via SUBCUTANEOUS
  Administered 2013-09-20: 5 [IU] via SUBCUTANEOUS
  Administered 2013-09-20 – 2013-09-22 (×5): 2 [IU] via SUBCUTANEOUS

## 2013-09-15 MED ORDER — SODIUM CHLORIDE 0.9 % IJ SOLN
INTRAMUSCULAR | Status: DC | PRN
  Administered 2013-09-15: 60 mL

## 2013-09-15 MED ORDER — SODIUM CHLORIDE 0.9 % IR SOLN
Status: DC | PRN
  Administered 2013-09-15: 3000 mL

## 2013-09-15 MED ORDER — METOCLOPRAMIDE HCL 5 MG/ML IJ SOLN: 10.0000 mg | Freq: Once | INTRAMUSCULAR | Status: DC | PRN

## 2013-09-15 MED ORDER — HYDROMORPHONE HCL PF 1 MG/ML IJ SOLN: 0.2500 mg | INTRAMUSCULAR | Status: DC | PRN

## 2013-09-15 MED ORDER — PROPOFOL 10 MG/ML IV BOLUS
INTRAVENOUS | Status: DC | PRN
  Administered 2013-09-15: 150 mg via INTRAVENOUS

## 2013-09-15 MED ORDER — ZOLPIDEM TARTRATE 5 MG PO TABS: 5.0000 mg | ORAL_TABLET | Freq: Every evening | ORAL | Status: DC | PRN

## 2013-09-15 MED ORDER — SODIUM CHLORIDE 0.9 % IJ SOLN
INTRAMUSCULAR | Status: DC | PRN
  Administered 2013-09-15: 08:00:00

## 2013-09-15 MED ORDER — METHOCARBAMOL 100 MG/ML IJ SOLN
500.0000 mg | Freq: Four times a day (QID) | INTRAVENOUS | Status: DC | PRN
  Filled 2013-09-15: qty 5

## 2013-09-15 MED ORDER — SENNOSIDES-DOCUSATE SODIUM 8.6-50 MG PO TABS: 1.0000 | ORAL_TABLET | Freq: Every evening | ORAL | Status: DC | PRN

## 2013-09-15 MED ORDER — METOCLOPRAMIDE HCL 5 MG/ML IJ SOLN
5.0000 mg | Freq: Three times a day (TID) | INTRAMUSCULAR | Status: DC | PRN
  Administered 2013-09-20: 10 mg via INTRAVENOUS
  Filled 2013-09-15: qty 2

## 2013-09-15 MED ORDER — SODIUM CHLORIDE 0.9 % IV SOLN: INTRAVENOUS | Status: DC

## 2013-09-15 MED ORDER — CEFAZOLIN SODIUM-DEXTROSE 2-3 GM-% IV SOLR
2.0000 g | Freq: Four times a day (QID) | INTRAVENOUS | Status: AC
  Administered 2013-09-15 (×2): 2 g via INTRAVENOUS
  Filled 2013-09-15 (×3): qty 50

## 2013-09-15 MED ORDER — LIDOCAINE HCL 1 % IJ SOLN
INTRAMUSCULAR | Status: DC | PRN
  Administered 2013-09-15: 2 mL via INTRADERMAL

## 2013-09-15 MED ORDER — ALUM & MAG HYDROXIDE-SIMETH 200-200-20 MG/5ML PO SUSP: 30.0000 mL | ORAL | Status: DC | PRN

## 2013-09-15 MED ORDER — LOSARTAN POTASSIUM 25 MG PO TABS
25.0000 mg | ORAL_TABLET | Freq: Every day | ORAL | Status: DC
  Administered 2013-09-15 – 2013-09-22 (×6): 25 mg via ORAL
  Filled 2013-09-15 (×8): qty 1

## 2013-09-15 MED ORDER — ONDANSETRON HCL 4 MG/2ML IJ SOLN
INTRAMUSCULAR | Status: DC | PRN
  Administered 2013-09-15: 4 mg via INTRAVENOUS

## 2013-09-15 MED ORDER — NEOSTIGMINE METHYLSULFATE 1 MG/ML IJ SOLN
INTRAMUSCULAR | Status: DC | PRN
  Administered 2013-09-15: 4 mg via INTRAVENOUS

## 2013-09-15 MED ORDER — OXYCODONE HCL 5 MG/5ML PO SOLN: 5.0000 mg | Freq: Once | ORAL | Status: DC | PRN

## 2013-09-15 SURGICAL SUPPLY — 58 items
BANDAGE ESMARK 6X9 LF (GAUZE/BANDAGES/DRESSINGS) ×1 IMPLANT
BLADE SAGITTAL 13X1.27X60 (BLADE) ×2 IMPLANT
BLADE SAW SGTL 83.5X18.5 (BLADE) ×2 IMPLANT
BNDG CMPR 9X6 STRL LF SNTH (GAUZE/BANDAGES/DRESSINGS) ×1
BNDG ESMARK 6X9 LF (GAUZE/BANDAGES/DRESSINGS) ×2
BOWL SMART MIX CTS (DISPOSABLE) ×2 IMPLANT
CAP POR TM CP VIT E LN CER HD ×1 IMPLANT
CEMENT BONE SIMPLEX SPEEDSET (Cement) ×4 IMPLANT
CLOTH BEACON ORANGE TIMEOUT ST (SAFETY) ×2 IMPLANT
COVER SURGICAL LIGHT HANDLE (MISCELLANEOUS) ×2 IMPLANT
CUFF TOURNIQUET SINGLE 34IN LL (TOURNIQUET CUFF) ×2 IMPLANT
DRAPE EXTREMITY T 121X128X90 (DRAPE) ×2 IMPLANT
DRAPE INCISE IOBAN 66X45 STRL (DRAPES) ×4 IMPLANT
DRAPE PROXIMA HALF (DRAPES) ×2 IMPLANT
DRAPE U-SHAPE 47X51 STRL (DRAPES) ×2 IMPLANT
DRSG ADAPTIC 3X8 NADH LF (GAUZE/BANDAGES/DRESSINGS) ×2 IMPLANT
DRSG PAD ABDOMINAL 8X10 ST (GAUZE/BANDAGES/DRESSINGS) ×2 IMPLANT
DURAPREP 26ML APPLICATOR (WOUND CARE) ×3 IMPLANT
ELECT REM PT RETURN 9FT ADLT (ELECTROSURGICAL) ×2
ELECTRODE REM PT RTRN 9FT ADLT (ELECTROSURGICAL) ×1 IMPLANT
EVACUATOR 1/8 PVC DRAIN (DRAIN) ×2 IMPLANT
GLOVE BIOGEL M 7.0 STRL (GLOVE) IMPLANT
GLOVE BIOGEL PI IND STRL 7.5 (GLOVE) IMPLANT
GLOVE BIOGEL PI IND STRL 8.5 (GLOVE) ×2 IMPLANT
GLOVE BIOGEL PI INDICATOR 7.5 (GLOVE)
GLOVE BIOGEL PI INDICATOR 8.5 (GLOVE) ×2
GLOVE SURG ORTHO 8.0 STRL STRW (GLOVE) ×4 IMPLANT
GOWN PREVENTION PLUS XLARGE (GOWN DISPOSABLE) ×4 IMPLANT
GOWN STRL NON-REIN LRG LVL3 (GOWN DISPOSABLE) ×4 IMPLANT
HANDPIECE INTERPULSE COAX TIP (DISPOSABLE) ×2
HOOD PEEL AWAY FACE SHEILD DIS (HOOD) ×8 IMPLANT
KIT BASIN OR (CUSTOM PROCEDURE TRAY) ×2 IMPLANT
KIT ROOM TURNOVER OR (KITS) ×2 IMPLANT
MANIFOLD NEPTUNE II (INSTRUMENTS) ×2 IMPLANT
NDL HYPO 21X1.5 SAFETY (NEEDLE) ×1 IMPLANT
NEEDLE 22X1 1/2 (OR ONLY) (NEEDLE) ×2 IMPLANT
NEEDLE HYPO 21X1.5 SAFETY (NEEDLE) ×2 IMPLANT
NS IRRIG 1000ML POUR BTL (IV SOLUTION) ×2 IMPLANT
PACK TOTAL JOINT (CUSTOM PROCEDURE TRAY) ×2 IMPLANT
PAD ARMBOARD 7.5X6 YLW CONV (MISCELLANEOUS) ×4 IMPLANT
PADDING CAST COTTON 6X4 STRL (CAST SUPPLIES) ×2 IMPLANT
POSITIONER HEAD PRONE TRACH (MISCELLANEOUS) ×2 IMPLANT
SET HNDPC FAN SPRY TIP SCT (DISPOSABLE) ×1 IMPLANT
SPONGE GAUZE 4X4 12PLY (GAUZE/BANDAGES/DRESSINGS) ×2 IMPLANT
STAPLER VISISTAT 35W (STAPLE) ×2 IMPLANT
SUCTION FRAZIER TIP 10 FR DISP (SUCTIONS) ×2 IMPLANT
SUT BONE WAX W31G (SUTURE) ×2 IMPLANT
SUT VIC AB 0 CTB1 27 (SUTURE) ×4 IMPLANT
SUT VIC AB 1 CT1 27 (SUTURE) ×4
SUT VIC AB 1 CT1 27XBRD ANBCTR (SUTURE) ×2 IMPLANT
SUT VIC AB 2-0 CT1 27 (SUTURE) ×4
SUT VIC AB 2-0 CT1 TAPERPNT 27 (SUTURE) ×2 IMPLANT
SYR 50ML LL SCALE MARK (SYRINGE) ×1 IMPLANT
SYR CONTROL 10ML LL (SYRINGE) ×2 IMPLANT
TOWEL OR 17X24 6PK STRL BLUE (TOWEL DISPOSABLE) ×2 IMPLANT
TOWEL OR 17X26 10 PK STRL BLUE (TOWEL DISPOSABLE) ×2 IMPLANT
TRAY FOLEY CATH 16FRSI W/METER (SET/KITS/TRAYS/PACK) ×2 IMPLANT
WATER STERILE IRR 1000ML POUR (IV SOLUTION) ×4 IMPLANT

## 2013-09-15 NOTE — Evaluation (Signed)
Physical Therapy Evaluation Patient Details Name: [name] MRN: [id number] DOB: 29-Aug-1946 Today's Date: 09/15/2013 Time: 4098-1191 PT Time Calculation (min): 20 min  PT Assessment / Plan / Recommendation History of Present Illness  [name]   Clinical Impression  Pt is s/p Lt TKA POD#0 resulting in the deficits listed below (see PT Problem List). Pt will benefit from skilled PT to increase their independence and safety with mobility to allow discharge to the venue listed below. [name]. Unable to tolerated footsie roll in chair; towel roll placed under Lt heel to facilitate extension. [name]      PT Assessment  Patient needs continued PT services    Follow Up Recommendations  Home health PT;Supervision/Assistance - 24 hour    Does the patient have the potential to tolerate intense rehabilitation      Barriers to Discharge        Equipment Recommendations  Other (comment) (all DME has been delievered )    Recommendations for Other Services     Frequency 7X/week    Precautions / Restrictions Precautions Precautions: Fall;Knee Precaution Comments: [name]; educated pt and husband to not have pillow under knee   Required Braces or Orthoses: Other Brace/Splint Other Brace/Splint: footsie roll  Restrictions Weight Bearing Restrictions: Yes LLE Weight Bearing: Weight bearing as tolerated   Pertinent Vitals/Pain 10/10; patient repositioned for comfort in chair        Mobility  Bed Mobility Bed Mobility: Supine to Sit;Sitting - Scoot to Edge of Bed Supine to Sit: 4: Min assist;HOB elevated;With rails Sitting - Scoot to Edge of Bed: 4: Min guard Details for Bed Mobility Assistance: (A) to advance Lt LE to/off EOB; cues for hand placement and sequencing; pt requires incr time due to pain Transfers Transfers: Sit to Stand;Stand to Sit Sit  to Stand: 3: Mod assist;From bed;With upper extremity assist;From elevated surface Stand to Sit: 3: Mod assist;To chair/3-in-1;With armrests;With upper extremity assist Details for Transfer Assistance: (A) to achieve upright standing position, to control descent to chair while maintaining balance; cues for hand placement and sequencing Ambulation/Gait Ambulation/Gait Assistance: 3: Mod assist Ambulation Distance (Feet): 4 Feet (forward and pivotal steps) Assistive device: Rolling walker Ambulation/Gait Assistance Details: cues for gt sequencing and upright posture; minor buckling of Lt LE; encouraged to WB throuhg Rt LE and UEs to prevent buckling; mod (A) to maintain balance and manage RW; pt c/o incr pain with amb  Gait Pattern: Step-to pattern;Decreased stance time - left;Decreased step length - right;Trunk flexed Gait velocity: decreased General Gait Details: cues for upright posture  Stairs: No Wheelchair Mobility Wheelchair Mobility: No    Exercises Total Joint Exercises Ankle Circles/Pumps: AROM;Both;10 reps;Seated   PT Diagnosis: Difficulty walking;Acute pain  PT Problem List: Decreased strength;Decreased range of motion;Decreased activity tolerance;Decreased balance;Decreased mobility;Decreased knowledge of use of DME;Pain PT Treatment Interventions: DME instruction;Gait training;Stair training;Functional mobility training;Therapeutic activities;Therapeutic exercise;Balance training;Neuromuscular re-education;Patient/family education     PT Goals(Current goals can be found in the care plan section) Acute Rehab PT Goals Patient Stated Goal: to go home with husband when im ready PT Goal Formulation: With patient/family Time For Goal Achievement: 09/22/13 Potential to Achieve Goals: Good  Visit Information  Last PT Received On: 09/15/13 Assistance Needed: +1 History of Present Illness: [name]        Prior Functioning  Home Living Family/patient expects to be  discharged to:: Private residence Living Arrangements: Spouse/significant other Available Help at Discharge: Family;Available 24 hours/day Type of Home: House Home Access: Stairs to enter [employer] of Steps: 2 Entrance Stairs-Rails: Right (going up) Home Layout: One level Home Equipment: Walker - 2 wheels;Bedside commode Prior Function Level of Independence: Independent Communication Communication: No difficulties    Cognition  Cognition Arousal/Alertness: Awake/alert Behavior During Therapy: WFL for tasks assessed/performed Overall Cognitive Status: Within Functional Limits for tasks assessed    Extremity/Trunk Assessment Upper Extremity Assessment Upper Extremity Assessment: Defer to OT evaluation Lower Extremity Assessment Lower Extremity Assessment: LLE deficits/detail LLE: Unable to fully assess due to pain LLE Sensation: decreased light touch Cervical / Trunk Assessment Cervical / Trunk Assessment: Kyphotic   Balance Balance Balance Assessed: Yes Static Sitting Balance Static Sitting - Balance Support: Bilateral upper extremity supported;Feet supported Static Sitting - Level of Assistance: 5: Stand by assistance Static Sitting - Comment/# of Minutes: toelrated sitting EOB ~4 min to control nausea; pt reported that dizziniess resolved with deep breathing   End of Session PT - End of Session Equipment Utilized During Treatment: Gait belt Activity Tolerance: Patient limited by pain Patient left: in chair;with call bell/phone within reach;with family/visitor present Nurse Communication: Mobility status CPM Left Knee CPM Left Knee: Off Left Knee Flexion (Degrees): 90 Left Knee Extension (Degrees): 0  GP     Shelva Majestic [place], [place] [phone] 09/15/2013, 12:55 PM

## 2013-09-15 NOTE — Anesthesia Postprocedure Evaluation (Signed)
Anesthesia Post Note  Patient: [name]  Procedure(s) Performed: Procedure(s) (LRB): TOTAL KNEE ARTHROPLASTY (Left)  Anesthesia type: General  Patient location: PACU  Post pain: Pain level controlled  Post assessment: Patient's Cardiovascular Status Stable  Last Vitals:  Filed Vitals:   09/15/13 1030  BP: 139/63  Pulse: 99  Temp: 36.6 C  Resp: 20    Post vital signs: Reviewed and stable  Level of consciousness: alert  Complications: No apparent anesthesia complications

## 2013-09-15 NOTE — Progress Notes (Signed)
UR COMPLETED  

## 2013-09-15 NOTE — Preoperative (Signed)
Beta Blockers   Reason not to administer Beta Blockers:Not Applicable, took last PM 

## 2013-09-15 NOTE — Transfer of Care (Signed)
Immediate Anesthesia Transfer of Care Note  Patient: [name]  Procedure(s) Performed: Procedure(s): TOTAL KNEE ARTHROPLASTY (Left)  Patient Location: PACU  Anesthesia Type:General and Regional  Level of Consciousness: patient cooperative and responds to stimulation  Airway & Oxygen Therapy: Patient Spontanous Breathing and Patient connected to face mask oxygen  Post-op Assessment: Report given to PACU RN and Post -op Vital signs reviewed and stable  Post vital signs: Reviewed and stable  Complications: No apparent anesthesia complications

## 2013-09-15 NOTE — Anesthesia Preprocedure Evaluation (Signed)
Anesthesia Evaluation  Patient identified by MRN, date of birth, ID band Patient awake    Reviewed: Allergy & Precautions, H&P , NPO status , Patient's Chart, lab work & pertinent test results, reviewed documented beta blocker date and time   Airway Mallampati: II TM Distance: >3 FB Neck ROM: full    Dental   Pulmonary shortness of breath and with exertion,  breath sounds clear to auscultation        Cardiovascular hypertension, On Medications and On Home Beta Blockers + Past MI negative cardio ROS  Rhythm:regular     Neuro/Psych negative neurological ROS  negative psych ROS   GI/Hepatic negative GI ROS, Neg liver ROS,   Endo/Other  diabetes, Oral Hypoglycemic AgentsMorbid obesity  Renal/GU negative Renal ROS  negative genitourinary   Musculoskeletal   Abdominal   Peds  Hematology negative hematology ROS (+)   Anesthesia Other Findings See surgeon's H&P   Reproductive/Obstetrics negative OB ROS                           Anesthesia Physical Anesthesia Plan  ASA: III  Anesthesia Plan: General   Post-op Pain Management:    Induction: Intravenous  Airway Management Planned: Oral ETT  Additional Equipment:   Intra-op Plan:   Post-operative Plan: Extubation in OR  Informed Consent: I have reviewed the patients History and Physical, chart, labs and discussed the procedure including the risks, benefits and alternatives for the proposed anesthesia with the patient or authorized representative who has indicated his/her understanding and acceptance.   Dental Advisory Given  Plan Discussed with: CRNA and Surgeon  Anesthesia Plan Comments:         Anesthesia Quick Evaluation

## 2013-09-15 NOTE — Anesthesia Procedure Notes (Signed)
Anesthesia Regional Block:  Femoral nerve block  Pre-Anesthetic Checklist: ,, timeout performed, Correct Patient, Correct Site, Correct Laterality, Correct Procedure, Correct Position, site marked, Risks and benefits discussed,  Surgical consent,  Pre-op evaluation,  At surgeon's request and post-op pain management  Laterality: Left  Prep: chloraprep       Needles:   Needle Type: Other     Needle Length: 9cm  Needle Gauge: 21 and 21 G    Additional Needles:  Procedures: ultrasound guided (picture in chart) Femoral nerve block Narrative:  Start time: 09/15/2013 7:05 AM End time: 09/15/2013 7:11 AM Injection made incrementally with aspirations every 5 mL.  Performed by: Personally  Anesthesiologist: C. Datrell Dunton MD  Additional Notes: Ultrasound guidance used to: id relevant anatomy, confirm needle position, local anesthetic spread, avoidance of vascular puncture. Picture saved. No complications. Block performed personally by Janetta Hora. Gelene Mink, MD    Femoral nerve block

## 2013-09-15 NOTE — H&P (Signed)
[name] MRN:  [id number] DOB/SEX:  06/04/1946/female  CHIEF COMPLAINT:  Painful left Knee  HISTORY: Patient is a 67 y.o. female presented with a history of pain in the left knee. Onset of symptoms was gradual starting several years ago with gradually worsening course since that time. Prior procedures on the knee include arthroscopy. Patient has been treated conservatively with over-the-counter NSAIDs and activity modification. Patient currently rates pain in the knee at 9 out of 10 with activity. There is pain at night.  PAST MEDICAL HISTORY: Patient Active Problem List   Diagnosis Date Noted  . Onychomycosis 04/29/2013  . Pain in joint, ankle and foot 04/29/2013  . Benign hypertensive heart disease without heart failure 10/15/2012  . STEMI (ST elevation myocardial infarction) 05/30/2012  . Dyspnea 11/15/2011  . Herpes zoster 07/17/2011  . Fatigue 03/10/2011  . Chest pain 03/10/2011  . Acute sinusitis, unspecified 03/10/2011  . Neurofibromatosis   . Type II or unspecified type diabetes mellitus without mention of complication, uncontrolled   . HTN (hypertension)   . Obesity   . Osteoarthritis, knee    Past Medical History  Diagnosis Date  . Neurofibromatosis   . Diabetes mellitus   . HTN (hypertension)   . Obesity   . Osteoarthritis, knee   . Hyperlipidemia     statin intolerant. LDL is 83  . UTI (lower urinary tract infection) 05/29/12  . STEMI (ST elevation myocardial infarction)     Inferolateral STEMI 05/29/12 s/p DES to RCA, nl EF   Past Surgical History  Procedure Laterality Date  . Cardiac catheterization    . Tonsillectomy and adenoidectomy    . Clavicle surgery    . Abdominal hysterectomy    . Ankle fracture surgery Right      MEDICATIONS:   Prescriptions prior to admission  Medication Sig Dispense Refill  . aspirin 81 MG tablet Take 1 tablet (81 mg total) by mouth daily.      . Calcium Carbonate-Vitamin D (CALCIUM + D PO) Take 1 tablet by mouth  daily.       Marland Kitchen CINNAMON PO Take 1 capsule by mouth daily as needed (for vitamin). OCCASIONALLY      . fish oil-omega-3 fatty acids 1000 MG capsule Take 1 g by mouth daily.       Marland Kitchen glimepiride (AMARYL) 2 MG tablet Take 4 mg by mouth daily before breakfast.      . glucose blood test strip ONE TOUCH ULTRA TEST STRIPS, TEST DAILY DIAGNOSIS 250.02  100 each  12  . guaiFENesin (MUCINEX) 600 MG 12 hr tablet Take 600 mg by mouth 2 (two) times daily. PRN      . losartan (COZAAR) 25 MG tablet Take 25 mg by mouth daily.      . metFORMIN (GLUCOPHAGE) 1000 MG tablet Take 1 tablet (1,000 mg total) by mouth 2 (two) times daily.  180 tablet  3  . metoprolol succinate (TOPROL-XL) 25 MG 24 hr tablet Take 25 mg by mouth at bedtime.      . Multiple Vitamin (MULTIVITAMIN) tablet Take 1 tablet by mouth daily as needed (for vitamin).       . Multiple Vitamins-Minerals (OCUVITE PO) Take 1 tablet by mouth daily as needed (for vitamin - for eyes).       . nitroGLYCERIN (NITROSTAT) 0.4 MG SL tablet Place 1 tablet (0.4 mg total) under the tongue every 5 (five) minutes as needed for chest pain (up to 3 doses).  25 tablet  3  .  Ticagrelor (BRILINTA) 90 MG TABS tablet Take by mouth.        ALLERGIES:   Allergies  Allergen Reactions  . Amaryl Nausea Only  . Codeine Nausea And Vomiting  . Crestor [Rosuvastatin Calcium]   . Lipitor [Atorvastatin Calcium]     MYALGIAS  . Lovastatin     MYALGIAS   . Niaspan [Niacin Er] Nausea Only  . Sumycin [Tetracycline Hcl]   . Zetia [Ezetimibe]   . Zithromax [Azithromycin]   . Zocor [Simvastatin - High Dose]     MYALGIAS  . Zocor [Simvastatin]     Muscle pain    REVIEW OF SYSTEMS:  Pertinent items are noted in HPI.   FAMILY HISTORY:   Family History  Problem Relation Age of Onset  . Heart disease Mother   . Arthritis Mother   . Stroke Mother   . Heart disease Son     SOCIAL HISTORY:   History  Substance Use Topics  . Smoking status: Never Smoker   . Smokeless  tobacco: Never Used  . Alcohol Use: No     EXAMINATION:  Vital signs in last 24 hours: Temp:  [97.5 F (36.4 C)] 97.5 F (36.4 C) (09/29 0626) Pulse Rate:  [88] 88 (09/29 0626) Resp:  [20] 20 (09/29 0626) BP: (137)/(61) 137/61 mmHg (09/29 0626) SpO2:  [96 %] 96 % (09/29 0626)  General appearance: alert, cooperative and no distress Lungs: clear to auscultation bilaterally Heart: regular rate and rhythm, S1, S2 normal, no murmur, click, rub or gallop Abdomen: soft, non-tender; bowel sounds normal; no masses,  no organomegaly Extremities: Homans sign is negative, no sign of DVT Pulses: 2+ and symmetric Skin: Skin color, texture, turgor normal. No rashes or lesions Neurologic: Alert and oriented X 3, normal strength and tone. Normal symmetric reflexes. Normal coordination and gait  Musculoskeletal:  ROM 0-100, Ligaments intact,  Imaging Review Plain radiographs demonstrate severe degenerative joint disease of the left knee. The overall alignment is mild valgus. The bone quality appears to be good for age and reported activity level.  Assessment/Plan: End stage arthritis, left knee   The patient history, physical examination and imaging studies are consistent with advanced degenerative joint disease of the left knee. The patient has failed conservative treatment.  The clearance notes were reviewed.  After discussion with the patient it was felt that Total Knee Replacement was indicated. The procedure,  risks, and benefits of total knee arthroplasty were presented and reviewed. The risks including but not limited to aseptic loosening, infection, blood clots, vascular injury, stiffness, patella tracking problems complications among others were discussed. The patient acknowledged the explanation, agreed to proceed with the plan.  Keilyn Nadal 09/15/2013, 6:43 AM

## 2013-09-15 NOTE — Progress Notes (Signed)
Orthopedic Tech Progress Note Patient Details:  [name] 08/13/1946 [id number] Applied CPM to LLE.  Applied OHF with trapeze to pt.'s bed.  Left Footsie Roll with pt.'s nurse. CPM Left Knee CPM Left Knee: On Left Knee Flexion (Degrees): 90 Left Knee Extension (Degrees): 0   Lesle Chris 09/15/2013, 10:40 AM

## 2013-09-16 ENCOUNTER — Encounter (HOSPITAL_COMMUNITY): Payer: Self-pay | Admitting: Orthopedic Surgery

## 2013-09-16 LAB — BASIC METABOLIC PANEL
Chloride: 98 mEq/L (ref 96–112)
GFR calc Af Amer: 82 mL/min — ABNORMAL LOW (ref >90)
GFR calc non Af Amer: 71 mL/min — ABNORMAL LOW (ref >90)
Glucose, Bld: 161 mg/dL — ABNORMAL HIGH (ref 70–99)
Potassium: 4.2 mEq/L (ref 3.5–5.1)
Sodium: 135 mEq/L (ref 135–145)

## 2013-09-16 LAB — GLUCOSE, CAPILLARY
Glucose-Capillary: 183 mg/dL — ABNORMAL HIGH (ref 70–99)
Glucose-Capillary: 239 mg/dL — ABNORMAL HIGH (ref 70–99)
Glucose-Capillary: 180 mg/dL — ABNORMAL HIGH (ref 70–99)
Glucose-Capillary: 175 mg/dL — ABNORMAL HIGH (ref 70–99)

## 2013-09-16 LAB — CBC
MCHC: 33 g/dL (ref 30.0–36.0)
Platelets: 258 10*3/uL (ref 150–400)
RDW: 14 % (ref 11.5–15.5)
WBC: 9.9 10*3/uL (ref 4.0–10.5)

## 2013-09-16 LAB — HEMOGLOBIN A1C
Hgb A1c MFr Bld: 7.4 % — ABNORMAL HIGH (ref <5.7)
Mean Plasma Glucose: 166 mg/dL — ABNORMAL HIGH (ref <117)

## 2013-09-16 MED ORDER — METHOCARBAMOL 500 MG PO TABS: 500.0000 mg | ORAL_TABLET | Freq: Four times a day (QID) | ORAL | Status: DC | PRN

## 2013-09-16 MED ORDER — SODIUM CHLORIDE 0.9 % IV BOLUS (SEPSIS)
500.0000 mL | Freq: Once | INTRAVENOUS | Status: AC
  Administered 2013-09-16: 500 mL via INTRAVENOUS

## 2013-09-16 MED ORDER — ENOXAPARIN SODIUM 40 MG/0.4ML ~~LOC~~ SOLN: 40.0000 mg | SUBCUTANEOUS | Status: DC

## 2013-09-16 MED ORDER — OXYCODONE HCL 5 MG PO TABS: 5.0000 mg | ORAL_TABLET | ORAL | Status: DC | PRN

## 2013-09-16 MED ORDER — INFLUENZA VAC SPLIT QUAD 0.5 ML IM SUSP
0.5000 mL | INTRAMUSCULAR | Status: DC
  Filled 2013-09-16: qty 0.5

## 2013-09-16 MED ORDER — PROMETHAZINE HCL 50 MG PO TABS: 50.0000 mg | ORAL_TABLET | Freq: Four times a day (QID) | ORAL | Status: DC | PRN

## 2013-09-16 MED ORDER — PNEUMOCOCCAL VAC POLYVALENT 25 MCG/0.5ML IJ INJ
0.5000 mL | INJECTION | INTRAMUSCULAR | Status: DC
  Filled 2013-09-16: qty 0.5

## 2013-09-16 MED ORDER — HYDROCODONE-ACETAMINOPHEN 5-325 MG PO TABS
1.0000 | ORAL_TABLET | ORAL | Status: DC | PRN
  Administered 2013-09-16: 1 via ORAL
  Administered 2013-09-17 (×2): 2 via ORAL
  Administered 2013-09-17: 1 via ORAL
  Administered 2013-09-18 – 2013-09-22 (×14): 2 via ORAL
  Filled 2013-09-16 (×2): qty 2
  Filled 2013-09-16: qty 1
  Filled 2013-09-16 (×13): qty 2
  Filled 2013-09-16: qty 1
  Filled 2013-09-16: qty 2

## 2013-09-16 NOTE — Progress Notes (Signed)
Physical Therapy Treatment Patient Details Name: [name] MRN: [id number] DOB: [birth year] Today's Date: 09/16/2013 Time: 4098-1191 PT Time Calculation (min): 24 min  PT Assessment / Plan / Recommendation  History of Present Illness [name]    PT Comments   Upon arrival pt states she feels better this afternoon but quickly c/o nausea with activity.  Pt was only able to ambulate ~30'.   Although she was able to increase independence with tasks, this clinician feels pt would benefit from another therapy session tomorrow AM to ensure safe d/c home.  Unsure if pt is willing to do so, but RN made aware.      Follow Up Recommendations  Home health PT;Supervision/Assistance - 24 hour     Does the patient have the potential to tolerate intense rehabilitation     Barriers to Discharge        Equipment Recommendations       Recommendations for Other Services    Frequency 7X/week   Progress towards PT Goals Progress towards PT goals: Progressing toward goals  Plan Current plan remains appropriate    Precautions / Restrictions Precautions Precautions: Fall;Knee Required Braces or Orthoses: Other Brace/Splint Other Brace/Splint: footsie roll  Restrictions LLE Weight Bearing: Weight bearing as tolerated   Pertinent Vitals/Pain No pain indicated, only c/o nausea with activity      Mobility  Bed Mobility Bed Mobility: Sit to Supine Sit to Supine: 4: Min guard;HOB flat Transfers Transfers: Sit to Stand;Stand to Sit Sit to Stand: 4: Min assist;With upper extremity assist;With armrests;From chair/3-in-1 Stand to Sit: 4: Min guard;With upper extremity assist;To bed Details for Transfer Assistance: cues for hand placement Ambulation/Gait Ambulation/Gait Assistance: 4: Min guard Ambulation Distance (Feet): 30 Feet Assistive device: Rolling walker Ambulation/Gait Assistance Details: cues for sequencing & safe RW advancement.  Distance limited by nausea Gait Pattern:  Step-to pattern;Decreased step length - right;Decreased stance time - left;Decreased hip/knee flexion - left;Decreased weight shift to left Gait velocity: decreased Stairs: No (pt states she has a ramp she can use to enter home) Wheelchair Mobility Wheelchair Mobility: No         PT Goals (current goals can now be found in the care plan section) Acute Rehab PT Goals Patient Stated Goal: to go home with husband when im ready PT Goal Formulation: With patient/family Time For Goal Achievement: 09/22/13 Potential to Achieve Goals: Good  Visit Information  Last PT Received On: 09/16/13 Assistance Needed: +1 History of Present Illness: [name]     Subjective Data  Patient Stated Goal: to go home with husband when im ready   Cognition  Cognition Arousal/Alertness: Awake/alert Behavior During Therapy: WFL for tasks assessed/performed Overall Cognitive Status: Within Functional Limits for tasks assessed    Balance     End of Session PT - End of Session Equipment Utilized During Treatment: Gait belt Activity Tolerance: Other (comment) (limited by nausea) Patient left: in bed;with call bell/phone within reach Nurse Communication: Mobility status;Other (comment) (pt with nausea)   GP     Lara Mulch 09/16/2013, 1:25 PM   Verdell Face, PTA [phone] 09/16/2013

## 2013-09-16 NOTE — Op Note (Signed)
TOTAL KNEE REPLACEMENT OPERATIVE NOTE:  09/15/2013  2:12 PM  PATIENT:  [name]  67 y.o. female  PRE-OPERATIVE DIAGNOSIS:  osteoarthritis left knee  POST-OPERATIVE DIAGNOSIS:  osteoarthritis left knee  PROCEDURE:  Procedure(s): TOTAL KNEE ARTHROPLASTY  SURGEON:  Surgeon(s): Dannielle Huh, MD  PHYSICIAN ASSISTANT: Altamese Cabal, [facility]  ANESTHESIA:   general  DRAINS: Hemovac and On-Q Marcaine Pain Pump  SPECIMEN: None  COUNTS:  Correct  TOURNIQUET:   Total Tourniquet Time Documented: Thigh (Left) - 53 minutes Total: Thigh (Left) - 53 minutes   DICTATION:  Indication for procedure:    The patient is a 67 y.o. female who has failed conservative treatment for osteoarthritis left knee.  Informed consent was obtained prior to anesthesia. The risks versus benefits of the operation were explain and in a way the patient can, and did, understand.   Description of procedure:     The patient was taken to the operating room and placed under anesthesia.  The patient was positioned in the usual fashion taking care that all body parts were adequately padded and/or protected.  I foley catheter was placed.  A tourniquet was applied and the leg prepped and draped in the usual sterile fashion.  The extremity was exsanguinated with the esmarch and tourniquet inflated to 350 mmHg.  Pre-operative range of motion was normal.  The knee was in 5 degree of mild varus.  A midline incision approximately 6-7 inches long was made with a #10 blade.  A new blade was used to make a parapatellar arthrotomy going 2-3 cm into the quadriceps tendon, over the patella, and alongside the medial aspect of the patellar tendon.  A synovectomy was then performed with the #10 blade and forceps. I then elevated the deep MCL off the medial tibial metaphysis subperiosteally around to the semimembranosus attachment.    I everted the patella and used calipers to measure patellar thickness.  I used the reamer to ream  down to appropriate thickness to recreate the native thickness.  I then removed excess bone with the rongeur and sagittal saw.  I used the appropriately sized template and drilled the three lug holes.  I then put the trial in place and measured the thickness with the calipers to ensure recreation of the native thickness.  The trial was then removed and the patella subluxed and the knee brought into flexion.  A homan retractor was place to retract and protect the patella and lateral structures.  A Z-retractor was place medially to protect the medial structures.  The extra-medullary alignment system was used to make cut the tibial articular surface perpendicular to the anamotic axis of the tibia and in 3 degrees of posterior slope.  The cut surface and alignment jig was removed.  I then used the intramedullary alignment guide to make a 6 valgus cut on the distal femur.  I then marked out the epicondylar axis on the distal femur.  The posterior condylar axis measured 3 degrees.  I then used the anterior referencing sizer and measured the femur to be a size 7.  The 4-In-1 cutting block was screwed into place in external rotation matching the posterior condylar angle, making our cuts perpendicular to the epicondylar axis.  Anterior, posterior and chamfer cuts were made with the sagittal saw.  The cutting block and cut pieces were removed.  A lamina spreader was placed in 90 degrees of flexion.  The ACL, PCL, menisci, and posterior condylar osteophytes were removed.  A 10 mm spacer blocked  was found to offer good flexion and extension gap balance after minimal in degree releasing.   The scoop retractor was then placed and the femoral finishing block was pinned in place.  The small sagittal saw was used as well as the lug drill to finish the femur.  The block and cut surfaces were removed and the medullary canal hole filled with autograft bone from the cut pieces.  The tibia was delivered forward in deep flexion  and external rotation.  A size D tray was selected and pinned into place centered on the medial 1/3 of the tibial tubercle.  The reamer and keel was used to prepare the tibia through the tray.    I then trialed with the size 10 femur, size D tibia, a 10 mm insert and the 32 patella.  I had excellent flexion/extension gap balance, excellent patella tracking.  Flexion was full and beyond 120 degrees; extension was zero.  These components were chosen and the staff opened them to me on the back table while the knee was lavaged copiously and the cement mixed.  The soft tissue was infiltrated with 60cc of exparel 1.3% through a 21 gauge needle.  I cemented in the components and removed all excess cement.  The polyethylene tibial component was snapped into place and the knee placed in extension while cement was hardening.  The capsule was infilltrated with 30cc of .25% Marcaine with epinephrine.  A hemovac was place in the joint exiting superolaterally.  A pain pump was place superomedially superficial to the arthrotomy.  Once the cement was hard, the tourniquet was let down.  Hemostasis was obtained.  The arthrotomy was closed with figure-8 #1 vicryl sutures.  The deep soft tissues were closed with #0 vicryls and the subcuticular layer closed with a running #2-0 vicryl.  The skin was reapproximated and closed with skin staples.  The wound was dressed with xeroform, 4 x4's, 2 ABD sponges, a single layer of webril and a TED stocking.   The patient was then awakened, extubated, and taken to the recovery room in stable condition.  BLOOD LOSS:  300cc DRAINS: 1 hemovac, 1 pain catheter COMPLICATIONS:  None.  PLAN OF CARE: Admit to inpatient   PATIENT DISPOSITION:  PACU - hemodynamically stable.   Delay start of Pharmacological VTE agent (>24hrs) due to surgical blood loss or risk of bleeding:  yes  Please fax a copy of this op note to my office at [phone] (please only include page 1 and 2 of the Case  Information op note)

## 2013-09-16 NOTE — Evaluation (Signed)
Occupational Therapy Evaluation Patient Details Name: [name] MRN: [id number] DOB: [birth year] Today's Date: 09/16/2013 Time: 5784-6962 OT Time Calculation (min): 23 min  OT Assessment / Plan / Recommendation History of present illness [name]    Clinical Impression   Pt educated in use of AE and DME for bathing and dressing.  Pt will get her own long sponge in the community and rely on her husband's assistance for LB dressing. Verbal instruction in shower transfer.  Pt agreeable to husband supervising shower transfer for safety.  Pt limited by nausea with activity.  No further OT needs.    OT Assessment  Patient does not need any further OT services    Follow Up Recommendations  No OT follow up    Barriers to Discharge      Equipment Recommendations  None recommended by OT    Recommendations for Other Services    Frequency       Precautions / Restrictions Precautions Precautions: Fall;Knee Required Braces or Orthoses: Other Brace/Splint Other Brace/Splint: footsie roll  Restrictions LLE Weight Bearing: Weight bearing as tolerated   Pertinent Vitals/Pain L knee, did not rate, premedicated, iced, repositioned    ADL  Eating/Feeding: Independent Where Assessed - Eating/Feeding: Chair Grooming: Wash/dry hands;Supervision/safety Where Assessed - Grooming: Unsupported standing Upper Body Bathing: Set up Where Assessed - Upper Body Bathing: Unsupported sitting Lower Body Bathing: Moderate assistance Where Assessed - Lower Body Bathing: Unsupported sitting;Supported sit to stand Upper Body Dressing: Set up Where Assessed - Upper Body Dressing: Unsupported sitting Lower Body Dressing: Moderate assistance Where Assessed - Lower Body Dressing: Unsupported sitting;Supported sit to stand Toilet Transfer: Min Pension scheme manager Method: Sit to Barista: Comfort height toilet;Grab bars Toileting - Architect and Hygiene:  Min guard Where Assessed - Engineer, mining and Hygiene: Sit to stand from 3-in-1 or toilet Tub/Shower Transfer:  (verbal instruction in shower transfer) Equipment Used: Rolling walker;Reacher;Long-handled sponge;Long-handled shoe horn;Sock aid ADL Comments: Educated pt in use of AE for LB ADL, multiple uses of 3 in1.    OT Diagnosis:    OT Problem List:   OT Treatment Interventions:     OT Goals(Current goals can be found in the care plan section) Acute Rehab OT Goals Patient Stated Goal: to go home with husband when im ready  Visit Information  Last OT Received On: 09/16/13 Assistance Needed: +1 History of Present Illness: [name]        Prior Functioning     Home Living Family/patient expects to be discharged to:: Private residence Living Arrangements: Spouse/significant other Available Help at Discharge: Family;Available 24 hours/day Type of Home: House Home Access: Ramped entrance;Stairs to enter Entrance Stairs-Number of Steps: 2 Entrance Stairs-Rails: Right Home Layout: One level Home Equipment: Walker - 2 wheels;Bedside commode Prior Function Level of Independence: Independent Communication Communication: No difficulties Dominant Hand: Right         Vision/Perception Vision - History Patient Visual Report: No change from baseline   Cognition  Cognition Arousal/Alertness: Awake/alert Behavior During Therapy: WFL for tasks assessed/performed Overall Cognitive Status: Within Functional Limits for tasks assessed    Extremity/Trunk Assessment Upper Extremity Assessment Upper Extremity Assessment: Overall WFL for tasks assessed Lower Extremity Assessment Lower Extremity Assessment: Defer to PT evaluation     Mobility Bed Mobility Bed Mobility: Sit to Supine;Supine to Sit Supine to Sit: 4: Min assist;With rails;HOB flat Sit to Supine: 4: Min assist Transfers Transfers: Sit to Stand;Stand to Sit Sit  to Stand: 4: Min  assist;With upper extremity assist;From bed Stand to Sit: With upper extremity assist;4: Min guard;To bed Details for Transfer Assistance: cues for hand placement     Exercise     Balance     End of Session OT - End of Session Activity Tolerance: Treatment limited secondary to medical complications (Comment);Patient limited by lethargy (nausea with activity) Patient left: in bed;with call bell/phone within reach  GO     Evern Bio 09/16/2013, 2:21 PM [phone]

## 2013-09-16 NOTE — Op Note (Signed)
TOTAL KNEE REPLACEMENT OPERATIVE NOTE:  09/15/2013  2:14 PM  PATIENT:  [name]  67 y.o. female  PRE-OPERATIVE DIAGNOSIS:  osteoarthritis left knee  POST-OPERATIVE DIAGNOSIS:  osteoarthritis left knee  PROCEDURE:  Procedure(s): TOTAL KNEE ARTHROPLASTY  SURGEON:  Surgeon(s): Dannielle Huh, MD  PHYSICIAN ASSISTANT: Altamese Cabal, [facility]  ANESTHESIA:   general  DRAINS: Hemovac and On-Q Marcaine Pain Pump  SPECIMEN: None  COUNTS:  Correct  TOURNIQUET:   Total Tourniquet Time Documented: Thigh (Left) - 53 minutes Total: Thigh (Left) - 53 minutes   DICTATION:  Indication for procedure:    The patient is a 67 y.o. female who has failed conservative treatment for osteoarthritis left knee.  Informed consent was obtained prior to anesthesia. The risks versus benefits of the operation were explain and in a way the patient can, and did, understand.   Description of procedure:     The patient was taken to the operating room and placed under anesthesia.  The patient was positioned in the usual fashion taking care that all body parts were adequately padded and/or protected.  I foley catheter was placed.  A tourniquet was applied and the leg prepped and draped in the usual sterile fashion.  The extremity was exsanguinated with the esmarch and tourniquet inflated to 350 mmHg.  Pre-operative range of motion was normal.  The knee was in 5 degree of mild varus.  A midline incision approximately 6-7 inches long was made with a #10 blade.  A new blade was used to make a parapatellar arthrotomy going 2-3 cm into the quadriceps tendon, over the patella, and alongside the medial aspect of the patellar tendon.  A synovectomy was then performed with the #10 blade and forceps. I then elevated the deep MCL off the medial tibial metaphysis subperiosteally around to the semimembranosus attachment.    I everted the patella and used calipers to measure patellar thickness.  I used the reamer to ream  down to appropriate thickness to recreate the native thickness.  I then removed excess bone with the rongeur and sagittal saw.  I used the appropriately sized template and drilled the three lug holes.  I then put the trial in place and measured the thickness with the calipers to ensure recreation of the native thickness.  The trial was then removed and the patella subluxed and the knee brought into flexion.  A homan retractor was place to retract and protect the patella and lateral structures.  A Z-retractor was place medially to protect the medial structures.  The extra-medullary alignment system was used to make cut the tibial articular surface perpendicular to the anamotic axis of the tibia and in 3 degrees of posterior slope.  The cut surface and alignment jig was removed.  I then used the intramedullary alignment guide to make a 6 valgus cut on the distal femur.  I then marked out the epicondylar axis on the distal femur.  The posterior condylar axis measured 3 degrees.  I then used the anterior referencing sizer and measured the femur to be a size 7.  The 4-In-1 cutting block was screwed into place in external rotation matching the posterior condylar angle, making our cuts perpendicular to the epicondylar axis.  Anterior, posterior and chamfer cuts were made with the sagittal saw.  The cutting block and cut pieces were removed.  A lamina spreader was placed in 90 degrees of flexion.  The ACL, PCL, menisci, and posterior condylar osteophytes were removed.  A 10 mm spacer blocked  was found to offer good flexion and extension gap balance after minimal in degree releasing.   The scoop retractor was then placed and the femoral finishing block was pinned in place.  The small sagittal saw was used as well as the lug drill to finish the femur.  The block and cut surfaces were removed and the medullary canal hole filled with autograft bone from the cut pieces.  The tibia was delivered forward in deep flexion  and external rotation.  A size D tray was selected and pinned into place centered on the medial 1/3 of the tibial tubercle.  The reamer and keel was used to prepare the tibia through the tray.    I then trialed with the size 10 femur, size D tibia, a 10 mm insert and the 32 patella.  I had excellent flexion/extension gap balance, excellent patella tracking.  Flexion was full and beyond 120 degrees; extension was zero.  These components were chosen and the staff opened them to me on the back table while the knee was lavaged copiously and the cement mixed.  The soft tissue was infiltrated with 60cc of exparel 1.3% through a 21 gauge needle.  I cemented in the components and removed all excess cement.  The polyethylene tibial component was snapped into place and the knee placed in extension while cement was hardening.  The capsule was infilltrated with 30cc of .25% Marcaine with epinephrine.  A hemovac was place in the joint exiting superolaterally.  A pain pump was place superomedially superficial to the arthrotomy.  Once the cement was hard, the tourniquet was let down.  Hemostasis was obtained.  The arthrotomy was closed with figure-8 #1 vicryl sutures.  The deep soft tissues were closed with #0 vicryls and the subcuticular layer closed with a running #2-0 vicryl.  The skin was reapproximated and closed with skin staples.  The wound was dressed with xeroform, 4 x4's, 2 ABD sponges, a single layer of webril and a TED stocking.   The patient was then awakened, extubated, and taken to the recovery room in stable condition.  BLOOD LOSS:  300cc DRAINS: 1 hemovac, 1 pain catheter COMPLICATIONS:  None.  PLAN OF CARE: Admit to inpatient   PATIENT DISPOSITION:  PACU - hemodynamically stable.   Delay start of Pharmacological VTE agent (>24hrs) due to surgical blood loss or risk of bleeding:  yes  Please fax a copy of this op note to my office at [phone] (please only include page 1 and 2 of the Case  Information op note)

## 2013-09-16 NOTE — Progress Notes (Signed)
09/16/13 Patient set up with Genevieve Norlander [facility] for HHPT by MD office. [employer] to provide CPM, rolling walker, and 3N1. Jacquelynn Cree RN, BSN, CCM

## 2013-09-16 NOTE — Progress Notes (Signed)
SPORTS MEDICINE AND JOINT REPLACEMENT  Georgena Spurling, MD   Altamese Cabal, PA-C [address] [place], [place], [place]  [address]                             [phone]   PROGRESS NOTE  Subjective:  negative for Chest Pain  negative for Shortness of Breath  positive for Nausea/Vomiting   negative for Calf Pain  negative for Bowel Movement   Tolerating Diet: yes         Patient reports pain as 5 on 0-10 scale.    Objective: Vital signs in last 24 hours:   Patient Vitals for the past 24 hrs:  BP Temp Temp src Pulse Resp SpO2  09/15/13 2004 127/69 mmHg 98.4 F (36.9 C) - 108 20 93 %  09/15/13 1600 - - - - 18 -  09/15/13 1351 133/64 mmHg 98.4 F (36.9 C) - 110 18 95 %  09/15/13 1230 126/63 mmHg 97.8 F (36.6 C) - 110 18 95 %  09/15/13 1200 - - - - 18 95 %  09/15/13 1130 125/70 mmHg 97.7 F (36.5 C) - 109 18 94 %  09/15/13 1030 139/63 mmHg 97.9 F (36.6 C) Oral 99 20 94 %  09/15/13 1006 - 98 F (36.7 C) - 102 20 92 %  09/15/13 1000 - - - 99 18 94 %  09/15/13 0945 - - - 92 17 98 %  09/15/13 0930 143/64 mmHg 98.4 F (36.9 C) - 104 13 97 %    @flow {1959:LAST@   Intake/Output from previous day:   09/29 0701 - 09/30 0700 In: 1240 [P.O.:240; I.V.:1000] Out: 250 [Urine:150; Drains:100]   Intake/Output this shift:       Intake/Output     09/29 0701 - 09/30 0700 09/30 0701 - 10/01 0700   P.O. 240    I.V. 1000    Total Intake 1240     Urine 150    Drains 100    Total Output 250     Net +990             LABORATORY DATA:  Recent Labs  09/15/13 1225 09/16/13 0430  WBC 10.6* 9.9  HGB 11.9* 10.7*  HCT 34.8* 32.4*  PLT 262 258    Recent Labs  09/15/13 1225 09/16/13 0430  NA  --  135  K  --  4.2  CL  --  98  CO2  --  26  BUN  --  12  CREATININE 0.85 0.84  GLUCOSE  --  161*  CALCIUM  --  8.2*   Lab Results  Component Value Date   INR 0.99 09/08/2013   INR 1.03 10/04/2009   INR 1.0 02/11/2009    Examination:  General appearance: alert,  cooperative and no distress Extremities: Homans sign is negative, no sign of DVT  Wound Exam: clean, dry, intact   Drainage:  Scant/small amount Serosanguinous exudate  Motor Exam: EHL and FHL Intact  Sensory Exam: Deep Peroneal normal   Assessment:    1 Day Post-Op  Procedure(s) (LRB): TOTAL KNEE ARTHROPLASTY (Left)  ADDITIONAL DIAGNOSIS:  Active Problems:   * No active hospital problems. *  Acute Blood Loss Anemia   Plan: Physical Therapy as ordered Weight Bearing as Tolerated (WBAT)  DVT Prophylaxis:  Lovenox  DISCHARGE PLAN: Home  DISCHARGE NEEDS: HHPT, CPM, Walker and 3-in-1 comode seat         Iley Deignan 09/16/2013, 9:03  AM

## 2013-09-16 NOTE — Progress Notes (Signed)
Physical Therapy Treatment Patient Details Name: [name] MRN: [id number] DOB: November 11, 1946 Today's Date: 09/16/2013 Time: 0812-0840 PT Time Calculation (min): 28 min  PT Assessment / Plan / Recommendation  History of Present Illness [name]    PT Comments   Mobility limited by nausea this session.  Will cont to follow acutely & with current POC to maximize functional mobility & ensure safe d/c home.     Follow Up Recommendations  Home health PT;Supervision/Assistance - 24 hour     Does the patient have the potential to tolerate intense rehabilitation     Barriers to Discharge        Equipment Recommendations       Recommendations for Other Services    Frequency 7X/week   Progress towards PT Goals Progress towards PT goals: Progressing toward goals (slowly.  Limited by nausea)  Plan Current plan remains appropriate    Precautions / Restrictions Precautions Precautions: Fall;Knee Required Braces or Orthoses: Other Brace/Splint Other Brace/Splint: footsie roll  Restrictions LLE Weight Bearing: Weight bearing as tolerated   Pertinent Vitals/Pain C/o Lt knee pain but did note rate however nausea limited mobility more than pain.      Mobility  Bed Mobility Bed Mobility: Supine to Sit;Sitting - Scoot to Edge of Bed Supine to Sit: 4: Min assist;With rails;HOB flat Sitting - Scoot to [place] of Bed: 4: Min assist Details for Bed Mobility Assistance: (A) to bring shoulders/trunk to sitting upright while evenly distributing weight through bil hips, & use of draw pad to bring Rt hip closer to EOB.   Transfers Transfers: Sit to Stand;Stand to Sit Sit to Stand: 3: Mod assist;With upper extremity assist;From bed Stand to Sit: 4: Min assist;With upper extremity assist;With armrests;To chair/3-in-1 Details for Transfer Assistance: cues for hand placement & technique.  (A) to achieve standing, & to control descent to recliner.   Ambulation/Gait Ambulation/Gait  Assistance: 4: Min assist Ambulation Distance (Feet):  (4 pivotal steps bed>recliner) Assistive device: Rolling walker Ambulation/Gait Assistance Details: cues for sequencing & technique.  Unable to increase distance due to pt with nausea.  No knee buckling noted with pivotal steps' Stairs: No Wheelchair Mobility Wheelchair Mobility: No    Exercises Total Joint Exercises Ankle Circles/Pumps: AROM;Both;15 reps Quad Sets: Strengthening;Both;10 reps Heel Slides: Strengthening;Left;10 reps;AAROM Straight Leg Raises: Strengthening;Left;10 reps;AAROM Long Arc Quad: AAROM;Strengthening;Left;10 reps;Seated Knee Flexion: AAROM;Left;10 reps;Seated (sitting in recliner)     PT Goals (current goals can now be found in the care plan section) Acute Rehab PT Goals Patient Stated Goal: to go home with husband when im ready PT Goal Formulation: With patient/family Time For Goal Achievement: 09/22/13 Potential to Achieve Goals: Good  Visit Information  Last PT Received On: 09/16/13 Assistance Needed: +1 History of Present Illness: [name]     Subjective Data  Patient Stated Goal: to go home with husband when im ready   Cognition  Cognition Arousal/Alertness: Awake/alert Behavior During Therapy: WFL for tasks assessed/performed Overall Cognitive Status: Within Functional Limits for tasks assessed    Balance     End of Session PT - End of Session Equipment Utilized During Treatment: Gait belt Activity Tolerance: Other (comment) (limited by nausea) Patient left: in chair;with call bell/phone within reach Nurse Communication: Mobility status   GP     Lara Mulch 09/16/2013, 8:50 AM  Verdell Face, PTA [phone] 09/16/2013

## 2013-09-17 ENCOUNTER — Inpatient Hospital Stay (HOSPITAL_COMMUNITY): Payer: Medicare Other

## 2013-09-17 ENCOUNTER — Telehealth: Payer: Self-pay | Admitting: Cardiology

## 2013-09-17 DIAGNOSIS — R0902 Hypoxemia: Secondary | ICD-10-CM

## 2013-09-17 DIAGNOSIS — I2699 Other pulmonary embolism without acute cor pulmonale: Secondary | ICD-10-CM

## 2013-09-17 HISTORY — DX: Other pulmonary embolism without acute cor pulmonale: I26.99

## 2013-09-17 LAB — GLUCOSE, CAPILLARY
Glucose-Capillary: 173 mg/dL — ABNORMAL HIGH (ref 70–99)
Glucose-Capillary: 206 mg/dL — ABNORMAL HIGH (ref 70–99)
Glucose-Capillary: 170 mg/dL — ABNORMAL HIGH (ref 70–99)

## 2013-09-17 LAB — CBC
Hemoglobin: 10.4 g/dL — ABNORMAL LOW (ref 12.0–15.0)
Platelets: 241 10*3/uL (ref 150–400)
RDW: 14.2 % (ref 11.5–15.5)
WBC: 12.2 10*3/uL — ABNORMAL HIGH (ref 4.0–10.5)

## 2013-09-17 LAB — ANTITHROMBIN III: AntiThromb III Func: 76 % (ref 75–120)

## 2013-09-17 LAB — BASIC METABOLIC PANEL
CO2: 27 mEq/L (ref 19–32)
Chloride: 96 mEq/L (ref 96–112)
GFR calc Af Amer: 86 mL/min — ABNORMAL LOW (ref >90)
Glucose, Bld: 175 mg/dL — ABNORMAL HIGH (ref 70–99)
Potassium: 4 mEq/L (ref 3.5–5.1)
Sodium: 132 mEq/L — ABNORMAL LOW (ref 135–145)

## 2013-09-17 MED ORDER — INFLUENZA VAC SPLIT QUAD 0.5 ML IM SUSP
0.5000 mL | INTRAMUSCULAR | Status: DC
  Filled 2013-09-17: qty 0.5

## 2013-09-17 MED ORDER — HEPARIN (PORCINE) IN NACL 100-0.45 UNIT/ML-% IJ SOLN
1600.0000 [IU]/h | INTRAMUSCULAR | Status: DC
  Administered 2013-09-17: 1300 [IU]/h via INTRAVENOUS
  Filled 2013-09-17 (×2): qty 250

## 2013-09-17 MED ORDER — HEPARIN BOLUS VIA INFUSION
3000.0000 [IU] | Freq: Once | INTRAVENOUS | Status: AC
  Administered 2013-09-17: 3000 [IU] via INTRAVENOUS
  Filled 2013-09-17: qty 3000

## 2013-09-17 MED ORDER — PNEUMOCOCCAL VAC POLYVALENT 25 MCG/0.5ML IJ INJ
0.5000 mL | INJECTION | INTRAMUSCULAR | Status: DC
  Filled 2013-09-17: qty 0.5

## 2013-09-17 MED ORDER — HYDROCODONE-ACETAMINOPHEN 5-325 MG PO TABS: 1.0000 | ORAL_TABLET | ORAL | Status: DC | PRN

## 2013-09-17 MED ORDER — MAGNESIUM HYDROXIDE 400 MG/5ML PO SUSP
30.0000 mL | Freq: Every day | ORAL | Status: DC | PRN
  Administered 2013-09-18: 30 mL via ORAL
  Filled 2013-09-17: qty 30

## 2013-09-17 NOTE — Progress Notes (Signed)
Physical Therapy Treatment Patient Details Name: [name] MRN: [id number] DOB: 14-Mar-1946 Today's Date: 09/17/2013 Time: 1012-1040 PT Time Calculation (min): 28 min  PT Assessment / Plan / Recommendation  History of Present Illness [name]    PT Comments   Pt cont's to report nausea but states it's improved compared to yesterday.  Pt is progressing with mobility as evident in ability to increase ambulation distance.  Pt safe to d/c home with husbands assistance from mobility standpoint once MD feels medically ready.    **Pt on 3L 02 up arrival with 02 sats mid 90's.  Trialed session without supplemental 02 due to pt not on 02 at home.  No SOB noted with activity however when returned back to room & placed back on pulse ox monitor,  02 sats were initially in 70's but improved to mid 80's with purse lip breathing but unable to recover without supplemental 02.  RN in to assess pt.**   Follow Up Recommendations  Home health PT;Supervision/Assistance - 24 hour     Does the patient have the potential to tolerate intense rehabilitation     Barriers to Discharge        Equipment Recommendations       Recommendations for Other Services    Frequency 7X/week   Progress towards PT Goals Progress towards PT goals: Progressing toward goals  Plan Current plan remains appropriate    Precautions / Restrictions Precautions Precautions: Fall;Knee Required Braces or Orthoses: Other Brace/Splint Other Brace/Splint: footsie roll  Restrictions LLE Weight Bearing: Weight bearing as tolerated       Mobility  Bed Mobility Bed Mobility: Not assessed Transfers Transfers: Sit to Stand;Stand to Sit Sit to Stand: 4: Min guard;With upper extremity assist;With armrests;From chair/3-in-1 Stand to Sit: 4: Min guard;With upper extremity assist;With armrests;To chair/3-in-1 Details for Transfer Assistance: cues to reinforce hand placement & to get hips squared up with seated surface  before sitting Ambulation/Gait Ambulation/Gait Assistance: 4: Min guard Ambulation Distance (Feet): 140 Feet Assistive device: Rolling walker Ambulation/Gait Assistance Details: Slow but steady gait.  Guarding for safety Gait Pattern: Step-through pattern;Decreased stride length;Decreased step length - right;Decreased weight shift to left Gait velocity: decreased but improving Stairs: No Wheelchair Mobility Wheelchair Mobility: No      PT Goals (current goals can now be found in the care plan section) Acute Rehab PT Goals PT Goal Formulation: With patient/family Time For Goal Achievement: 09/22/13 Potential to Achieve Goals: Good  Visit Information  Last PT Received On: 09/17/13 Assistance Needed: +1 History of Present Illness: [name]     Subjective Data      Cognition  Cognition Arousal/Alertness: Awake/alert Behavior During Therapy: WFL for tasks assessed/performed Overall Cognitive Status: Within Functional Limits for tasks assessed    Balance     End of Session PT - End of Session Equipment Utilized During Treatment: Gait belt Activity Tolerance: Patient tolerated treatment well Patient left: in chair;with call bell/phone within reach;with family/visitor present Nurse Communication: Mobility status   GP     Lara Mulch 09/17/2013, 10:45 AM  Verdell Face, PTA [phone] 09/17/2013

## 2013-09-17 NOTE — Progress Notes (Addendum)
Pt up with PT. Pt oxygen level on RA down in the 70's. At rest on RA, pt sats 85%. Pt oxygen sat slowly climbed back to 94% on 3 lpm at rest. Pt denies CP, SOB or any other s/sx of distress. No visible s/sx of distress. All repted to Altamese Cabal PA. Orders received for CT to rule out PE. Will continue to monitor.

## 2013-09-17 NOTE — Progress Notes (Signed)
ANTICOAGULATION CONSULT NOTE - Initial Consult  Pharmacy Consult for Heparin Indication: pulmonary embolus  Allergies  Allergen Reactions  . Amaryl Nausea Only  . Codeine Nausea And Vomiting  . Crestor [Rosuvastatin Calcium]   . Lipitor [Atorvastatin Calcium]     MYALGIAS  . Lovastatin     MYALGIAS   . Niaspan [Niacin Er] Nausea Only  . Sumycin [Tetracycline Hcl]   . Zetia [Ezetimibe]   . Zithromax [Azithromycin]   . Zocor [Simvastatin - High Dose]     MYALGIAS  . Zocor [Simvastatin]     Muscle pain    Patient Measurements: Height: 5\' 2"  (157.5 cm) Weight: 199 lb (90.266 kg) IBW/kg (Calculated) : 50.1 Heparin Dosing Weight: 73 kg  Vital Signs: Temp: 99.1 F (37.3 C) (10/01 1327) BP: 104/60 mmHg (10/01 1327) Pulse Rate: 98 (10/01 1327)  Labs:  Recent Labs  09/15/13 1225 09/16/13 0430 09/17/13 0435  HGB 11.9* 10.7* 10.4*  HCT 34.8* 32.4* 31.3*  PLT 262 258 241  CREATININE 0.85 0.84 0.81    Estimated Creatinine Clearance: 71.4 ml/min (by C-G formula based on Cr of 0.81).   Medical History: Past Medical History  Diagnosis Date  . Neurofibromatosis   . Diabetes mellitus   . HTN (hypertension)   . Obesity   . Osteoarthritis, knee   . Hyperlipidemia     statin intolerant. LDL is 83  . UTI (lower urinary tract infection) 05/29/12  . STEMI (ST elevation myocardial infarction)     Inferolateral STEMI 05/29/12 s/p DES to RCA, nl EF    Assessment: 67 year old female with a cardiac history, HTN, and diabetes who underwent knee replacement 09/15/13.  Now tachycardiac and hypoxic. CT confirmed PE.  Beginning IV heparin.  Received last dose of Lovenox 30 mg this AM.  Goal of Therapy:  Heparin level 0.3-0.7 units/ml Monitor platelets by anticoagulation protocol: Yes   Plan:  1) Heparin 3000 units iv bolus x 1 (smaller bolus due to surgery) 2) Heparin drip at 1300 units / hr 3) Heparin level and CBC 6 hours after heparin begins 4) Daily heparin level,  CBC  Thank you. Okey Regal, PharmD [phone]  09/17/2013,7:24 PM

## 2013-09-17 NOTE — Plan of Care (Signed)
Problem: Consults Goal: Diagnosis- Total Joint Replacement Outcome: Completed/Met Date Met:  09/17/13 Primary Total Knee

## 2013-09-17 NOTE — Progress Notes (Signed)
PT Cancellation Note  Patient Details Name: [name] MRN: [id number] DOB: 1946/10/03   Cancelled Treatment:      Pt off floor for procedure (CT scan).  Will f/u tomorrow.      Verdell Face, [place] [phone] 09/17/2013

## 2013-09-17 NOTE — Discharge Summary (Signed)
SPORTS MEDICINE & JOINT REPLACEMENT   Georgena Spurling, MD   Altamese Cabal, PA-C [address] [place], [place], [place]  [address]                             [phone]  PATIENT ID: [name]        MRN:  [id number]          DOB/AGE: Jun 18, 1946 / 67 y.o.    DISCHARGE SUMMARY  ADMISSION DATE:    09/15/2013 DISCHARGE DATE:   09/17/2013   ADMISSION DIAGNOSIS: osteoarthritis left knee    DISCHARGE DIAGNOSIS:  osteoarthritis left knee    ADDITIONAL DIAGNOSIS: Active Problems:   * No active hospital problems. *  Past Medical History  Diagnosis Date  . Neurofibromatosis   . Diabetes mellitus   . HTN (hypertension)   . Obesity   . Osteoarthritis, knee   . Hyperlipidemia     statin intolerant. LDL is 83  . UTI (lower urinary tract infection) 05/29/12  . STEMI (ST elevation myocardial infarction)     Inferolateral STEMI 05/29/12 s/p DES to RCA, nl EF    PROCEDURE: Procedure(s): TOTAL KNEE ARTHROPLASTY on 09/15/2013  CONSULTS:     HISTORY:  See H&P in chart  HOSPITAL COURSE:  [name] is a 67 y.o. admitted on 09/15/2013 and found to have a diagnosis of osteoarthritis left knee.  After appropriate laboratory studies were obtained  they were taken to the operating room on 09/15/2013 and underwent Procedure(s): TOTAL KNEE ARTHROPLASTY.   They were given perioperative antibiotics:  Anti-infectives   Start     Dose/Rate Route Frequency Ordered Stop   09/15/13 1400  ceFAZolin (ANCEF) IVPB 2 g/50 mL premix     2 g 100 mL/hr over 30 Minutes Intravenous Every 6 hours 09/15/13 1028 09/15/13 2025   09/15/13 0600  ceFAZolin (ANCEF) IVPB 2 g/50 mL premix     2 g 100 mL/hr over 30 Minutes Intravenous On call to O.R. 09/14/13 1345 09/15/13 0742    .  Tolerated the procedure well.  Placed with a foley intraoperatively.  Given Ofirmev at induction and for 48 hours.    POD# 1: Vital signs were stable.  Patient denied Chest pain, shortness of breath, or calf pain.  Patient  was started on Lovenox 30 mg subcutaneously twice daily at 8am.  Consults to PT, OT, and care management were made.  The patient was weight bearing as tolerated.  CPM was placed on the operative leg 0-90 degrees for 6-8 hours a day.  Incentive spirometry was taught.  Dressing was changed.  Marcaine pump and hemovac were discontinued.  Patient became lethargic and disoriented overnight. I received call and patient was given bolus and pain medicine changed.  Issues have since resolved and vital signs normal.  Oriented X4   POD #2, Continued  PT for ambulation and exercise program.  IV saline locked.  O2 discontinued.    The remainder of the hospital course was dedicated to ambulation and strengthening.   The patient was discharged on 2 Days Post-Op in  Good condition.  Blood products given:none  DIAGNOSTIC STUDIES: Recent vital signs: Patient Vitals for the past 24 hrs:  BP Temp Temp src Pulse Resp SpO2 Height Weight  09/17/13 0536 114/62 mmHg 98.3 F (36.8 C) - 93 18 99 % - -  09/17/13 0400 - - - - 18 - - -  09/17/13 0115 - 98.7 F (  37.1 C) - 106 18 93 % - -  09/17/13 0000 - - - - 18 - - -  09/16/13 2342 - - - - 18 91 % - -  09/16/13 2300 - - - - - 93 % - -  09/16/13 2231 - 98.9 F (37.2 C) - - - 91 % - -  09/16/13 2105 117/63 mmHg 102.5 F (39.2 C) Oral 118 16 90 % - -  09/16/13 2030 133/59 mmHg 103 F (39.4 C) - 123 18 88 % - -  09/16/13 2025 - - - - - 75 % - -  09/16/13 2000 - - - - - - 5\' 2"  (1.575 m) 90.266 kg (199 lb)  09/16/13 1410 116/61 mmHg 98.9 F (37.2 C) - 101 18 93 % - -       Recent laboratory studies:  Recent Labs  09/15/13 1225 09/16/13 0430 09/17/13 0435  WBC 10.6* 9.9 12.2*  HGB 11.9* 10.7* 10.4*  HCT 34.8* 32.4* 31.3*  PLT 262 258 241    Recent Labs  09/15/13 1225 09/16/13 0430 09/17/13 0435  NA  --  135 132*  K  --  4.2 4.0  CL  --  98 96  CO2  --  26 27  BUN  --  12 11  CREATININE 0.85 0.84 0.81  GLUCOSE  --  161* 175*  CALCIUM  --   8.2* 8.2*   Lab Results  Component Value Date   INR 0.99 09/08/2013   INR 1.03 10/04/2009   INR 1.0 02/11/2009     Recent Radiographic Studies :  No results found.  DISCHARGE INSTRUCTIONS: Discharge Orders   Future Appointments Provider Department Dept Phone   09/24/2013 8:15 AM [facility] [place] [facility] [phone]   09/24/2013 8:45 AM Cassell Clement, MD [facility] [place] [facility] [phone]   12/09/2013 9:45 AM Collene Gobble, MD [facility] [phone]   Future Orders Complete By Expires   Call MD / Call 911  As directed    Comments:     If you experience chest pain or shortness of breath, CALL 911 and be transported to the hospital emergency room.  If you develope a fever above 101 F, pus (white drainage) or increased drainage or redness at the wound, or calf pain, call your surgeon's office.   Change dressing  As directed    Comments:     Change dressing on Wednesday, then change the dressing daily with sterile 4 x 4 inch gauze dressing and apply TED hose.   Constipation Prevention  As directed    Comments:     Drink plenty of fluids.  Prune juice may be helpful.  You may use a stool softener, such as Colace (over the counter) 100 mg twice a day.  Use MiraLax (over the counter) for constipation as needed.   CPM  As directed    Comments:     Continuous passive motion machine (CPM):      Use the CPM from 0 to 90 for 6-8 hours per day.      You may increase by 10 per day.  You may break it up into 2 or 3 sessions per day.      Use CPM for 3 weeks or until you are told to stop.   Diet - low sodium heart healthy  As directed    Do not put a pillow under the knee. Place it under the heel.  As directed  Driving restrictions  As directed    Comments:     No driving for 6 weeks   Increase activity slowly as tolerated  As directed    Lifting restrictions  As directed    Comments:     No lifting for 6 weeks   TED hose  As directed     Comments:     Use stockings (TED hose) for 3 weeks on both leg(s).  You may remove them at night for sleeping.      DISCHARGE MEDICATIONS:     Medication List    STOP taking these medications       BRILINTA 90 MG Tabs tablet  Generic drug:  Ticagrelor      TAKE these medications       aspirin 81 MG tablet  Take 1 tablet (81 mg total) by mouth daily.     CALCIUM + D PO  Take 1 tablet by mouth daily.     CINNAMON PO  Take 1 capsule by mouth daily as needed (for vitamin). OCCASIONALLY     enoxaparin 40 MG/0.4ML injection  Commonly known as:  LOVENOX  Inject 0.4 mLs (40 mg total) into the skin daily.     fish oil-omega-3 fatty acids 1000 MG capsule  Take 1 g by mouth daily.     glimepiride 2 MG tablet  Commonly known as:  AMARYL  TAKE 2 TABLETS BY MOUTH DAILY BEFORE BREAKFAST     glimepiride 2 MG tablet  Commonly known as:  AMARYL  Take 4 mg by mouth daily before breakfast.     glucose blood test strip  ONE TOUCH ULTRA TEST STRIPS, TEST DAILY DIAGNOSIS 250.02     HYDROcodone-acetaminophen 5-325 MG per tablet  Commonly known as:  NORCO/VICODIN  Take 1-2 tablets by mouth every 4 (four) hours as needed.     losartan 25 MG tablet  Commonly known as:  COZAAR  Take 25 mg by mouth daily.     metFORMIN 1000 MG tablet  Commonly known as:  GLUCOPHAGE  Take 1 tablet (1,000 mg total) by mouth 2 (two) times daily.     methocarbamol 500 MG tablet  Commonly known as:  ROBAXIN  Take 1-2 tablets (500-1,000 mg total) by mouth every 6 (six) hours as needed.     metoprolol succinate 25 MG 24 hr tablet  Commonly known as:  TOPROL-XL  Take 25 mg by mouth at bedtime.     MUCINEX 600 MG 12 hr tablet  Generic drug:  guaiFENesin  Take 600 mg by mouth 2 (two) times daily. PRN     multivitamin tablet  Take 1 tablet by mouth daily as needed (for vitamin).     nitroGLYCERIN 0.4 MG SL tablet  Commonly known as:  NITROSTAT  Place 1 tablet (0.4 mg total) under the tongue every 5  (five) minutes as needed for chest pain (up to 3 doses).     OCUVITE PO  Take 1 tablet by mouth daily as needed (for vitamin - for eyes).     promethazine 50 MG tablet  Commonly known as:  PHENERGAN  Take 1 tablet (50 mg total) by mouth every 6 (six) hours as needed for nausea.        FOLLOW UP VISIT:       Follow-up Information   Follow up with Raymon Mutton, MD. Call on 09/30/2013.   Specialty:  Orthopedic Surgery   Contact information:   [address] [place] [address] [phone]  DISPOSITION: HOME   CONDITION:  Good   Jyoti Harju 09/17/2013, 7:02 AM

## 2013-09-17 NOTE — Progress Notes (Signed)
SPORTS MEDICINE AND JOINT REPLACEMENT  Sue Spurling, MD   Altamese Cabal, PA-C [address] [place], [place], [place]  [address]                             [phone]   PROGRESS NOTE  Subjective:  negative for Chest Pain  negative for Shortness of Breath  negative for Nausea/Vomiting   negative for Calf Pain  negative for Bowel Movement   Tolerating Diet: yes         Patient reports pain as 3 on 0-10 scale.    Objective: Vital signs in last 24 hours:   Patient Vitals for the past 24 hrs:  BP Temp Temp src Pulse Resp SpO2 Height Weight  09/17/13 0536 114/62 mmHg 98.3 F (36.8 C) - 93 18 99 % - -  09/17/13 0400 - - - - 18 - - -  09/17/13 0115 - 98.7 F (37.1 C) - 106 18 93 % - -  09/17/13 0000 - - - - 18 - - -  09/16/13 2342 - - - - 18 91 % - -  09/16/13 2300 - - - - - 93 % - -  09/16/13 2231 - 98.9 F (37.2 C) - - - 91 % - -  09/16/13 2105 117/63 mmHg 102.5 F (39.2 C) Oral 118 16 90 % - -  09/16/13 2030 133/59 mmHg 103 F (39.4 C) - 123 18 88 % - -  09/16/13 2025 - - - - - 75 % - -  09/16/13 2000 - - - - - - 5\' 2"  (1.575 m) 90.266 kg (199 lb)  09/16/13 1410 116/61 mmHg 98.9 F (37.2 C) - 101 18 93 % - -    @flow {1959:LAST@   Intake/Output from previous day:   09/30 0701 - 10/01 0700 In: 1340 [P.O.:840; I.V.:500] Out: 460 [Urine:450; Drains:10]   Intake/Output this shift:   09/30 1901 - 10/01 0700 In: 620 [P.O.:120; I.V.:500] Out: -    Intake/Output     09/30 0701 - 10/01 0700   P.O. 840   I.V. (mL/kg) 500 (5.5)   Total Intake(mL/kg) 1340 (14.8)   Urine (mL/kg/hr) 450 (0.2)   Drains 10 (0)   Total Output 460   Net +880       Urine Occurrence 3 x      LABORATORY DATA:  Recent Labs  09/15/13 1225 09/16/13 0430 09/17/13 0435  WBC 10.6* 9.9 12.2*  HGB 11.9* 10.7* 10.4*  HCT 34.8* 32.4* 31.3*  PLT 262 258 241    Recent Labs  09/15/13 1225 09/16/13 0430 09/17/13 0435  NA  --  135 132*  K  --  4.2 4.0  CL  --  98 96  CO2  --  26 27   BUN  --  12 11  CREATININE 0.85 0.84 0.81  GLUCOSE  --  161* 175*  CALCIUM  --  8.2* 8.2*   Lab Results  Component Value Date   INR 0.99 09/08/2013   INR 1.03 10/04/2009   INR 1.0 02/11/2009    Examination:  General appearance: alert, cooperative and no distress Extremities: Homans sign is negative, no sign of DVT  Wound Exam: clean, dry, intact   Drainage:  None: wound tissue dry  Motor Exam: EHL and FHL Intact  Sensory Exam: Deep Peroneal normal   Assessment:    2 Days Post-Op  Procedure(s) (LRB): TOTAL KNEE ARTHROPLASTY (Left)  ADDITIONAL DIAGNOSIS:  Active Problems:   * No active hospital problems. *  Acute Blood Loss Anemia   Plan: Physical Therapy as ordered Weight Bearing as Tolerated (WBAT)  DVT Prophylaxis:  Lovenox  DISCHARGE PLAN: Home  DISCHARGE NEEDS: HHPT, CPM, Walker and 3-in-1 comode seat         [name] 09/17/2013, 6:58 AM

## 2013-09-17 NOTE — Consult Note (Signed)
PULMONARY  / CRITICAL CARE MEDICINE  Name: [name] MRN: [id number] DOB: 1946-07-21    ADMISSION DATE:  09/15/2013 CONSULTATION DATE:  09/17/2013  REFERRING MD :  Dr. Sherlean Foot PRIMARY SERVICE:  Orthopedics  CHIEF COMPLAINT:  Hypoxemia  BRIEF PATIENT DESCRIPTION: 67 year old with a cardiac history, HTN and diabetes presenting to the hospital for left knee replacement.  On 10/1 patient was found to be tachycardic and hypoxic.  CTA was performed that showed a pulmonary embolism and PCCM was called on consultation.  Patient denies previous blood clots or family history of blood clots.  Has not been on blood thinners in the past.  No hemoptysis or chest pain.  SIGNIFICANT EVENTS / STUDIES:  10/1 >>> CTA with PE.  LINES / TUBES: PIV   CULTURES: None  ANTIBIOTICS: None  PAST MEDICAL HISTORY :  Past Medical History  Diagnosis Date  . Neurofibromatosis   . Diabetes mellitus   . HTN (hypertension)   . Obesity   . Osteoarthritis, knee   . Hyperlipidemia     statin intolerant. LDL is 83  . UTI (lower urinary tract infection) 05/29/12  . STEMI (ST elevation myocardial infarction)     Inferolateral STEMI 05/29/12 s/p DES to RCA, nl EF   Past Surgical History  Procedure Laterality Date  . Cardiac catheterization    . Tonsillectomy and adenoidectomy    . Clavicle surgery    . Abdominal hysterectomy    . Ankle fracture surgery Right   . Total knee arthroplasty Left 09/15/2013    Procedure: TOTAL KNEE ARTHROPLASTY;  Surgeon: Dannielle Huh, MD;  Location: [facility] OR;  Service: Orthopedics;  Laterality: Left;   Prior to Admission medications   Medication Sig Start Date End Date Taking? Authorizing Provider  aspirin 81 MG tablet Take 1 tablet (81 mg total) by mouth daily. 06/01/12  Yes Jessica A Hope, PA-C  Calcium Carbonate-Vitamin D (CALCIUM + D PO) Take 1 tablet by mouth daily.    Yes Historical Provider, MD  CINNAMON PO Take 1 capsule by mouth daily as needed (for vitamin). OCCASIONALLY    Yes Historical Provider, MD  fish oil-omega-3 fatty acids 1000 MG capsule Take 1 g by mouth daily.    Yes Historical Provider, MD  glimepiride (AMARYL) 2 MG tablet Take 4 mg by mouth daily before breakfast.   Yes Historical Provider, MD  glucose blood test strip ONE TOUCH ULTRA TEST STRIPS, TEST DAILY DIAGNOSIS 250.02 05/29/13  Yes Cassell Clement, MD  guaiFENesin (MUCINEX) 600 MG 12 hr tablet Take 600 mg by mouth 2 (two) times daily. PRN   Yes Historical Provider, MD  losartan (COZAAR) 25 MG tablet Take 25 mg by mouth daily.   Yes Historical Provider, MD  metFORMIN (GLUCOPHAGE) 1000 MG tablet Take 1 tablet (1,000 mg total) by mouth 2 (two) times daily. 07/14/13  Yes Cassell Clement, MD  metoprolol succinate (TOPROL-XL) 25 MG 24 hr tablet Take 25 mg by mouth at bedtime.   Yes Historical Provider, MD  Multiple Vitamin (MULTIVITAMIN) tablet Take 1 tablet by mouth daily as needed (for vitamin).    Yes Historical Provider, MD  Multiple Vitamins-Minerals (OCUVITE PO) Take 1 tablet by mouth daily as needed (for vitamin - for eyes).    Yes Historical Provider, MD  nitroGLYCERIN (NITROSTAT) 0.4 MG SL tablet Place 1 tablet (0.4 mg total) under the tongue every 5 (five) minutes as needed for chest pain (up to 3 doses). 07/01/13  Yes Cassell Clement, MD  enoxaparin (LOVENOX)  40 MG/0.4ML injection Inject 0.4 mLs (40 mg total) into the skin daily. 09/16/13   Altamese Cabal, PA-C  glimepiride (AMARYL) 2 MG tablet TAKE 2 TABLETS BY MOUTH DAILY BEFORE BREAKFAST 09/12/13   Cassell Clement, MD  HYDROcodone-acetaminophen (NORCO/VICODIN) 5-325 MG per tablet Take 1-2 tablets by mouth every 4 (four) hours as needed. 09/17/13   Altamese Cabal, PA-C  methocarbamol (ROBAXIN) 500 MG tablet Take 1-2 tablets (500-1,000 mg total) by mouth every 6 (six) hours as needed. 09/16/13   Altamese Cabal, PA-C  promethazine (PHENERGAN) 50 MG tablet Take 1 tablet (50 mg total) by mouth every 6 (six) hours as needed for nausea. 09/16/13   Altamese Cabal, PA-C   Allergies  Allergen Reactions  . Amaryl Nausea Only  . Codeine Nausea And Vomiting  . Crestor [Rosuvastatin Calcium]   . Lipitor [Atorvastatin Calcium]     MYALGIAS  . Lovastatin     MYALGIAS   . Niaspan [Niacin Er] Nausea Only  . Sumycin [Tetracycline Hcl]   . Zetia [Ezetimibe]   . Zithromax [Azithromycin]   . Zocor [Simvastatin - High Dose]     MYALGIAS  . Zocor [Simvastatin]     Muscle pain    FAMILY HISTORY:  Family History  Problem Relation Age of Onset  . Heart disease Mother   . Arthritis Mother   . Stroke Mother   . Heart disease Son    SOCIAL HISTORY:  reports that she has never smoked. She has never used smokeless tobacco. She reports that she does not drink alcohol or use illicit drugs.  REVIEW OF SYSTEMS:   12 point ROS is negative other than mentioned above.  SUBJECTIVE:   VITAL SIGNS: Temp:  [98.3 F (36.8 C)-103 F (39.4 C)] 99.1 F (37.3 C) (10/01 1327) Pulse Rate:  [93-123] 98 (10/01 1327) Resp:  [16-18] 18 (10/01 1327) BP: (104-133)/(59-63) 104/60 mmHg (10/01 1327) SpO2:  [75 %-99 %] 93 % (10/01 1327) Weight:  [90.266 kg (199 lb)] 90.266 kg (199 lb) (09/30 2000)  PHYSICAL EXAMINATION: General:  Well appearing, mild pain in the left knee but stable. Neuro:  Alert and interactive, moves all ext to command. HEENT:  [place]/AT, PERRL, EOM-I and MMM. Neck:  Supple, -LAN and -thyromegally. Cardiovascular:  RRR, Nl S1/S2, -M/R/G. Lungs:  CTA bilaterally. Abdomen:  Soft, NT, ND and +BS. Musculoskeletal:  -edema and -tenderness. Skin:  Intact, unable to evaluate wound due to bandages.   Recent Labs Lab 09/15/13 1225 09/16/13 0430 09/17/13 0435  NA  --  135 132*  K  --  4.2 4.0  CL  --  98 96  CO2  --  26 27  BUN  --  12 11  CREATININE 0.85 0.84 0.81  GLUCOSE  --  161* 175*    Recent Labs Lab 09/15/13 1225 09/16/13 0430 09/17/13 0435  HGB 11.9* 10.7* 10.4*  HCT 34.8* 32.4* 31.3*  WBC 10.6* 9.9 12.2*  PLT 262 258 241    Ct Angio Chest Pe W/cm &/or Wo Cm  09/17/2013   CLINICAL DATA:  Hypoxia  EXAM: CT ANGIOGRAPHY CHEST WITH CONTRAST  TECHNIQUE: Multidetector CT imaging of the chest was performed using the standard protocol during bolus administration of intravenous contrast. Multiplanar CT image reconstructions including MIPs were obtained to evaluate the vascular anatomy.  CONTRAST:  100 mL Omnipaque 350 nonionic  COMPARISON:  Chest radiograph January 21, 2013 and CT angiogram chest February 12, 2009  FINDINGS: There is extensive pulmonary embolus the arising from the distal  right main pulmonary artery and extending in to proximal right lower and lower lobe branches. On the left, there are multiple the left lower lobe segmental pulmonary emboli as well as posterior segment right upper lobe segmental pulmonary artery branch.  There is no thoracic aortic aneurysm or dissection.  There is patchy bibasilar consolidation. There is patchy atelectasis in the lower lobes as well. There is a minimal effusion on the left.  There is no appreciable thoracic adenopathy. Pericardium is not thickened.  In the visualized upper abdomen, there is fatty change in the liver. There are no blastic or lytic bone lesions. Thyroid appears unremarkable.  Review of the MIP images confirms the above findings.  IMPRESSION: Extensive pulmonary embolus bilaterally with pulmonary embolus on the right arising in the distal right main pulmonary artery. There are pulmonary emboli in bilateral upper and lower lobe branches the.  There is patchy infiltrate in both lower lobes. Small left effusion.  Fatty liver.  Critical Value/emergent results were called by telephone at the time of interpretation on 09/17/2013 at 3:06 PM to [facility] , who verbally acknowledged these results.   Electronically Signed   By: Bretta Bang   On: 09/17/2013 15:07    ASSESSMENT / PLAN:  67 year old female with no previous blood clot history, currently on lovenox DVT  prophylaxis who developed a provoked PE.  Patient was on ticagrelor prior to surgery for stent placement.  Communicated with ortho, ok to start anti-coagulant.   This is a first PE that is provoked by surgery.  Plan: - Start heparin drip per pharmacy. - Will need 6-9 months of anti-coagulation. - Hypercoagulable panel. - IS per RT protocol. - 2D echo. - Bilateral lower ext dopplers for DVT ?if filter will be needed. - Titrate O2 for sats.  Alyson Reedy, M.D. Pulmonary and Critical Care Medicine [facility] Pager: [phone]  09/17/2013, 4:33 PM

## 2013-09-17 NOTE — Progress Notes (Signed)
Earlier in shift, patient had temp of 103.0, heart rate 123 and O2 sat 75% on room.  Patient was drowsy, but arousable.  Disoriented to time/year and situation.  Follows commands.  PERRLA.  Oxygen applied at 4lpm to get O2 sat up to 88%.  Patient instructed to cough, deep breathe and perform incentive spirometry.  Weak cough and inspirations on I.S.  Lung sounds clear but diminished bilaterally.  Ice packs placed to underarms and tylenol given.  PA-C notified and orders for NS 500cc bolus, D/C oxyIR and start vicodin PRN.  Temp down to 98.9 and 98.7 and heart rate 106.  Oxygen weaned down to 2L nasal cannula with sats 90-93%.  Patient now awake, alert and oriented X4.  Pain to left knee relieved with one vicodin.  She does voice complaint of a sore throat and taking own supply of throat lozenge.  Patient was up to [facility] with 2+ assist for transfer d/t weakness.  Continue to monitor.  Call light in reach.

## 2013-09-17 NOTE — Telephone Encounter (Deleted)
Error

## 2013-09-18 DIAGNOSIS — I2699 Other pulmonary embolism without acute cor pulmonale: Secondary | ICD-10-CM

## 2013-09-18 DIAGNOSIS — I517 Cardiomegaly: Secondary | ICD-10-CM

## 2013-09-18 DIAGNOSIS — Z96659 Presence of unspecified artificial knee joint: Secondary | ICD-10-CM

## 2013-09-18 DIAGNOSIS — Z86718 Personal history of other venous thrombosis and embolism: Secondary | ICD-10-CM

## 2013-09-18 LAB — LUPUS ANTICOAGULANT PANEL
DRVVT: 40.2 secs (ref <42.9)
Lupus Anticoagulant: NOT DETECTED

## 2013-09-18 LAB — CARDIOLIPIN ANTIBODIES, IGG, IGM, IGA
Anticardiolipin IgA: 8 APL U/mL — ABNORMAL LOW (ref <22)
Anticardiolipin IgG: 0 GPL U/mL — ABNORMAL LOW (ref <23)
Anticardiolipin IgM: 0 MPL U/mL — ABNORMAL LOW (ref <11)

## 2013-09-18 LAB — BETA-2-GLYCOPROTEIN I ABS, IGG/M/A
Beta-2-Glycoprotein I IgA: 1 A Units (ref <20)
Beta-2-Glycoprotein I IgM: 0 M Units (ref <20)

## 2013-09-18 LAB — BASIC METABOLIC PANEL
BUN: 10 mg/dL (ref 6–23)
CO2: 29 mEq/L (ref 19–32)
Calcium: 8 mg/dL — ABNORMAL LOW (ref 8.4–10.5)
Chloride: 102 mEq/L (ref 96–112)
Creatinine, Ser: 0.72 mg/dL (ref 0.50–1.10)
GFR calc non Af Amer: 88 mL/min — ABNORMAL LOW (ref >90)

## 2013-09-18 LAB — CBC
MCH: 27.6 pg (ref 26.0–34.0)
MCHC: 33.1 g/dL (ref 30.0–36.0)
MCV: 83.4 fL (ref 78.0–100.0)
Platelets: 238 10*3/uL (ref 150–400)
RBC: 3.55 MIL/uL — ABNORMAL LOW (ref 3.87–5.11)
RDW: 14.3 % (ref 11.5–15.5)

## 2013-09-18 LAB — PROTEIN S ACTIVITY: Protein S Activity: 64 % — ABNORMAL LOW (ref 69–129)

## 2013-09-18 LAB — HEPARIN LEVEL (UNFRACTIONATED)
Heparin Unfractionated: 0.14 IU/mL — ABNORMAL LOW (ref 0.30–0.70)
Heparin Unfractionated: 0.13 IU/mL — ABNORMAL LOW (ref 0.30–0.70)
Heparin Unfractionated: 0.16 IU/mL — ABNORMAL LOW (ref 0.30–0.70)

## 2013-09-18 LAB — PROTHROMBIN GENE MUTATION

## 2013-09-18 LAB — GLUCOSE, CAPILLARY
Glucose-Capillary: 165 mg/dL — ABNORMAL HIGH (ref 70–99)
Glucose-Capillary: 127 mg/dL — ABNORMAL HIGH (ref 70–99)

## 2013-09-18 MED ORDER — PNEUMOCOCCAL VAC POLYVALENT 25 MCG/0.5ML IJ INJ
0.5000 mL | INJECTION | INTRAMUSCULAR | Status: AC
  Administered 2013-09-20: 0.5 mL via INTRAMUSCULAR
  Filled 2013-09-18: qty 0.5

## 2013-09-18 MED ORDER — INFLUENZA VAC SPLIT QUAD 0.5 ML IM SUSP
0.5000 mL | INTRAMUSCULAR | Status: AC
  Administered 2013-09-20: 0.5 mL via INTRAMUSCULAR
  Filled 2013-09-18: qty 0.5

## 2013-09-18 MED ORDER — COUMADIN BOOK
Freq: Once | Status: AC
  Administered 2013-09-18: 14:00:00
  Filled 2013-09-18: qty 1

## 2013-09-18 MED ORDER — WARFARIN SODIUM 7.5 MG PO TABS
7.5000 mg | ORAL_TABLET | Freq: Once | ORAL | Status: AC
  Administered 2013-09-18: 7.5 mg via ORAL
  Filled 2013-09-18: qty 1

## 2013-09-18 MED ORDER — HEPARIN (PORCINE) IN NACL 100-0.45 UNIT/ML-% IJ SOLN
1800.0000 [IU]/h | INTRAMUSCULAR | Status: DC
  Administered 2013-09-18: 1800 [IU]/h via INTRAVENOUS
  Filled 2013-09-18 (×2): qty 250

## 2013-09-18 MED ORDER — HEPARIN BOLUS VIA INFUSION
1000.0000 [IU] | Freq: Once | INTRAVENOUS | Status: AC
  Administered 2013-09-18: 1000 [IU] via INTRAVENOUS
  Filled 2013-09-18: qty 1000

## 2013-09-18 MED ORDER — HEPARIN BOLUS VIA INFUSION
3000.0000 [IU] | Freq: Once | INTRAVENOUS | Status: AC
  Administered 2013-09-18: 3000 [IU] via INTRAVENOUS
  Filled 2013-09-18: qty 3000

## 2013-09-18 MED ORDER — WARFARIN - PHARMACIST DOSING INPATIENT: Freq: Every day | Status: DC

## 2013-09-18 MED ORDER — HEPARIN BOLUS VIA INFUSION
2000.0000 [IU] | Freq: Once | INTRAVENOUS | Status: AC
  Administered 2013-09-18: 2000 [IU] via INTRAVENOUS
  Filled 2013-09-18: qty 2000

## 2013-09-18 MED ORDER — HEPARIN (PORCINE) IN NACL 100-0.45 UNIT/ML-% IJ SOLN
2050.0000 [IU]/h | INTRAMUSCULAR | Status: DC
  Administered 2013-09-18 – 2013-09-19 (×2): 2050 [IU]/h via INTRAVENOUS
  Filled 2013-09-18 (×3): qty 250

## 2013-09-18 NOTE — Progress Notes (Signed)
ANTICOAGULATION CONSULT NOTE - Follow Up Consult  Pharmacy Consult for heparin and warfarin Indication: pulmonary embolus  Allergies  Allergen Reactions  . Amaryl Nausea Only  . Codeine Nausea And Vomiting  . Crestor [Rosuvastatin Calcium]   . Lipitor [Atorvastatin Calcium]     MYALGIAS  . Lovastatin     MYALGIAS   . Niaspan [Niacin Er] Nausea Only  . Sumycin [Tetracycline Hcl]   . Zetia [Ezetimibe]   . Zithromax [Azithromycin]   . Zocor [Simvastatin - High Dose]     MYALGIAS  . Zocor [Simvastatin]     Muscle pain    Patient Measurements: Height: 5\' 2"  (157.5 cm) Weight: 199 lb (90.266 kg) IBW/kg (Calculated) : 50.1 Heparin Dosing Weight: 73kg  Vital Signs: Temp: 99 F (37.2 C) (10/02 1300) Temp src: Oral (10/02 1300) BP: 120/59 mmHg (10/02 1300) Pulse Rate: 101 (10/02 1300)  Labs:  Recent Labs  09/16/13 0430 09/17/13 0435 09/18/13 0318 09/18/13 1245  HGB 10.7* 10.4* 9.8*  --   HCT 32.4* 31.3* 29.6*  --   PLT 258 241 238  --   HEPARINUNFRC  --   --  0.14* 0.13*  CREATININE 0.84 0.81 0.72  --     Estimated Creatinine Clearance: 72.3 ml/min (by C-G formula based on Cr of 0.72).   Medications:  Heparin at 1600units/hr  Assessment: 41 YOF who underwent knee replacement on 9/29 and was now found to have a PE and started on heparin. Also to start bridge to warfarin tonight. She was given a repeat bolus and rate increase after her initial heparin level was low, but she remains low currently. Spoke with RN and there has not been issues with the line nor has the drip been stopped. No oozing or bleeding from surgical site. Her platelets remain WNL, though Hgb has dropped since surgery. Baseline INR was 0.99 on 9/22. She was on Brilinta prior to surgery, but no anticoagulants. No history of blood clots.  INR 2-3 Heparin level 0.3-0.7 units/ml Monitor platelets by anticoagulation protocol: Yes   Plan:  1. Heparin bolus with 2000 units IV x1 2. Increase heparin  drip to 1800 units/hr 3. Heparin level in 6 hours 4. Warfarin 7.5mg  po x1 tonight 5. Daily HL, CBC, and PT/INR 6. Will order warfarin education book- will provide verbal education to her tomorrow  Earlisha Sharples D. Oz Gammel, PharmD Clinical Pharmacist Pager: [phone] 09/18/2013 1:44 PM

## 2013-09-18 NOTE — Progress Notes (Signed)
Echocardiogram completed.

## 2013-09-18 NOTE — Progress Notes (Signed)
ANTICOAGULATION CONSULT NOTE - Follow Up Consult  Pharmacy Consult for heparin  Indication: pulmonary embolus  Allergies  Allergen Reactions  . Amaryl Nausea Only  . Codeine Nausea And Vomiting  . Crestor [Rosuvastatin Calcium]   . Lipitor [Atorvastatin Calcium]     MYALGIAS  . Lovastatin     MYALGIAS   . Niaspan [Niacin Er] Nausea Only  . Sumycin [Tetracycline Hcl]   . Zetia [Ezetimibe]   . Zithromax [Azithromycin]   . Zocor [Simvastatin - High Dose]     MYALGIAS  . Zocor [Simvastatin]     Muscle pain    Patient Measurements: Height: 5\' 2"  (157.5 cm) Weight: 199 lb (90.266 kg) IBW/kg (Calculated) : 50.1 Heparin Dosing Weight: 73kg  Vital Signs: Temp: 99.9 F (37.7 C) (10/02 2009) Temp src: Oral (10/02 2009) BP: 131/65 mmHg (10/02 2009) Pulse Rate: 104 (10/02 2009)  Labs:  Recent Labs  09/16/13 0430 09/17/13 0435 09/18/13 0318 09/18/13 1245 09/18/13 2000  HGB 10.7* 10.4* 9.8*  --   --   HCT 32.4* 31.3* 29.6*  --   --   PLT 258 241 238  --   --   HEPARINUNFRC  --   --  0.14* 0.13* 0.16*  CREATININE 0.84 0.81 0.72  --   --     Estimated Creatinine Clearance: 72.3 ml/min (by C-G formula based on Cr of 0.72).   Medications:  Heparin at 1800units/hr  Assessment: 66 YOF who underwent knee replacement on 9/29 and was now found to have a PE and started on heparin. After last bolus and infusion increase the heparin level remains subtherapeutic (HL= 0.16).  Goal of Therapy:  INR 2-3 Heparin level 0.3-0.7 units/ml Monitor platelets by anticoagulation protocol: Yes   Plan:   -Heparin bolus 3000 units then increase infusion to 2050 units/hr -Heparin level in 6 hrs  Harland German, Pharm D 09/18/2013 9:17 PM

## 2013-09-18 NOTE — Progress Notes (Addendum)
PULMONARY  / CRITICAL CARE MEDICINE  Name: [name] MRN: [id number] DOB: 01-19-1946    ADMISSION DATE:  09/15/2013 CONSULTATION DATE:  09/17/2013  REFERRING MD :  Dr. Sherlean Foot PRIMARY SERVICE:  Orthopedics  CHIEF COMPLAINT:  Hypoxemia  BRIEF PATIENT DESCRIPTION: 67 year old with a cardiac history, HTN and diabetes presenting to the hospital for left knee replacement.  On 10/1 patient was found to be tachycardic and hypoxic.  CTA was performed that showed a pulmonary embolism and PCCM was called on consultation.  Patient denies previous blood clots or family history of blood clots.  Has not been on blood thinners in the past.  No hemoptysis or chest pain.  SIGNIFICANT EVENTS / STUDIES:  10/1 >>> CTA with PE.  LINES / TUBES: PIV   CULTURES: None  ANTIBIOTICS: None    SUBJECTIVE:   VITAL SIGNS: Temp:  [98.9 F (37.2 C)-99.7 F (37.6 C)] 99.5 F (37.5 C) (10/02 0631) Pulse Rate:  [93-120] 93 (10/02 0631) Resp:  [18] 18 (10/02 0631) BP: (100-124)/(53-64) 100/53 mmHg (10/02 0631) SpO2:  [93 %-98 %] 96 % (10/02 0631)  PHYSICAL EXAMINATION: General:  Well appearing, mild pain in the left knee but stable. Neuro:  Alert and interactive, moves all ext to command. HEENT:  No jvd Neck:  Supple, -LAN  Cardiovascular:  RRR Lungs:  CTA bilaterally. Abdomen:  Soft, NT, ND and +BS. Musculoskeletal:  -edema and -tenderness.left knee dressing dry and intact Skin:  Intact, unable to evaluate wound due to bandages.   Recent Labs Lab 09/16/13 0430 09/17/13 0435 09/18/13 0318  NA 135 132* 138  K 4.2 4.0 4.0  CL 98 96 102  CO2 26 27 29   BUN 12 11 10   CREATININE 0.84 0.81 0.72  GLUCOSE 161* 175* 143*    Recent Labs Lab 09/16/13 0430 09/17/13 0435 09/18/13 0318  HGB 10.7* 10.4* 9.8*  HCT 32.4* 31.3* 29.6*  WBC 9.9 12.2* 11.0*  PLT 258 241 238   Ct Angio Chest Pe W/cm &/or Wo Cm  09/17/2013   CLINICAL DATA:  Hypoxia  EXAM: CT ANGIOGRAPHY CHEST WITH CONTRAST   TECHNIQUE: Multidetector CT imaging of the chest was performed using the standard protocol during bolus administration of intravenous contrast. Multiplanar CT image reconstructions including MIPs were obtained to evaluate the vascular anatomy.  CONTRAST:  100 mL Omnipaque 350 nonionic  COMPARISON:  Chest radiograph January 21, 2013 and CT angiogram chest February 12, 2009  FINDINGS: There is extensive pulmonary embolus the arising from the distal right main pulmonary artery and extending in to proximal right lower and lower lobe branches. On the left, there are multiple the left lower lobe segmental pulmonary emboli as well as posterior segment right upper lobe segmental pulmonary artery branch.  There is no thoracic aortic aneurysm or dissection.  There is patchy bibasilar consolidation. There is patchy atelectasis in the lower lobes as well. There is a minimal effusion on the left.  There is no appreciable thoracic adenopathy. Pericardium is not thickened.  In the visualized upper abdomen, there is fatty change in the liver. There are no blastic or lytic bone lesions. Thyroid appears unremarkable.  Review of the MIP images confirms the above findings.  IMPRESSION: Extensive pulmonary embolus bilaterally with pulmonary embolus on the right arising in the distal right main pulmonary artery. There are pulmonary emboli in bilateral upper and lower lobe branches the.  There is patchy infiltrate in both lower lobes. Small left effusion.  Fatty liver.  Critical Value/emergent  results were called by telephone at the time of interpretation on 09/17/2013 at 3:06 PM to [facility] , who verbally acknowledged these results.   Electronically Signed   By: Bretta Bang   On: 09/17/2013 15:07    ASSESSMENT / PLAN:  67 year old female with no previous blood clot history, currently on lovenox DVT prophylaxis who developed a provoked PE.  Patient was on ticagrelor prior to surgery for stent placement.  Communicated with  ortho, ok to start anti-coagulant.   This is a first PE that is provoked by surgery.  Plan: -Cont heparin infusion -Begin warfarin today -Will need 2 days of therapeutic overlap (INR > 2) before discontinuation of heparin -After provoked (post-ortho procedure) VTE, usual recommended duration is 3-6 months with 6 mos preferred if no problems with anticoagulation -There is no reason to suspect a hypercoag state but apparently a panel has been sent already so will follow up on this -Early discharge can be facilitated by SQ LMWH while awaiting therapeutic PT-INRs -Would like to see her off O2 prior to discharge if possible -F/U echocardiogram and LE Dopplers    Brett Canales Minor ACNP [facility] P[facility] Pager [phone] till 3 pm If no answer page [phone] 09/18/2013, 9:34 AM  I have interviewed and examined the patient and reviewed the database. I have formulated the assessment and plan as reflected in the note above with amendments made by me.    Sue Fischer, MD ; [facility] [phone].  After 5:30 PM or weekends, call [phone]

## 2013-09-18 NOTE — Progress Notes (Signed)
Patient sleeping well at this time.  Oxygen sat 93% on 3L [place].  HR 93.  RR 18, even, unlabored, in no acute distress.  Heparin infusion increased to 16cc/hr and 1,000unit bolus given per pharmacy orders.    Earlier in shift, patient given cepacol lozenges for c/o sore throat.  Pt also presents with productive cough (thick, white phlegm).  Performs incentive spirometry as instructed and CBD.

## 2013-09-18 NOTE — Progress Notes (Signed)
PT Cancellation Note  Patient Details Name: [name] MRN: [id number] DOB: 11-11-1946   Cancelled Treatment:    Reason Eval/Treat Not Completed: Medical issues which prohibited therapy.  RN requesting to hold for now.  Will check back with RN later.      Verdell Face, [place] [phone] 09/18/2013

## 2013-09-18 NOTE — Progress Notes (Signed)
SPORTS MEDICINE AND JOINT REPLACEMENT  Sue Spurling, MD   Altamese Cabal, PA-C [address] [place], [place], [place]  [address]                             [phone]   PROGRESS NOTE  Subjective:  negative for Chest Pain  negative for Shortness of Breath  negative for Nausea/Vomiting   negative for Calf Pain  negative for Bowel Movement   Tolerating Diet: yes         Patient reports pain as 5 on 0-10 scale.    Objective: Vital signs in last 24 hours:   Patient Vitals for the past 24 hrs:  BP Temp Temp src Pulse Resp SpO2  09/18/13 0631 100/53 mmHg 99.5 F (37.5 C) Oral 93 18 96 %  09/18/13 0211 107/64 mmHg 99.7 F (37.6 C) Oral 96 18 96 %  09/17/13 2207 - - - 120 - -  09/17/13 1948 124/64 mmHg 98.9 F (37.2 C) Oral 100 18 98 %  09/17/13 1600 - - - - - 94 %  09/17/13 1327 104/60 mmHg 99.1 F (37.3 C) - 98 18 93 %    @flow {1959:LAST@   Intake/Output from previous day:   10/01 0701 - 10/02 0700 In: 1274 [P.O.:960; I.V.:314] Out: 850 [Urine:850]   Intake/Output this shift:   10/02 0701 - 10/02 1900 In: 200 [I.V.:200] Out: -    Intake/Output     10/01 0701 - 10/02 0700 10/02 0701 - 10/03 0700   P.O. 960    I.V. (mL/kg) 314 (3.5) 200 (2.2)   Total Intake(mL/kg) 1274 (14.1) 200 (2.2)   Urine (mL/kg/hr) 850 (0.4)    Drains     Total Output 850     Net +424 +200        Urine Occurrence 3 x       LABORATORY DATA:  Recent Labs  09/15/13 1225 09/16/13 0430 09/17/13 0435 09/18/13 0318  WBC 10.6* 9.9 12.2* 11.0*  HGB 11.9* 10.7* 10.4* 9.8*  HCT 34.8* 32.4* 31.3* 29.6*  PLT 262 258 241 238    Recent Labs  09/15/13 1225 09/16/13 0430 09/17/13 0435 09/18/13 0318  NA  --  135 132* 138  K  --  4.2 4.0 4.0  CL  --  98 96 102  CO2  --  26 27 29   BUN  --  12 11 10   CREATININE 0.85 0.84 0.81 0.72  GLUCOSE  --  161* 175* 143*  CALCIUM  --  8.2* 8.2* 8.0*   Lab Results  Component Value Date   INR 0.99 09/08/2013   INR 1.03 10/04/2009   INR 1.0  02/11/2009    Examination:  General appearance: alert, cooperative and no distress Extremities: Homans sign is negative, no sign of DVT  Wound Exam: clean, dry, intact   Drainage:  None: wound tissue dry  Motor Exam: EHL and FHL Intact  Sensory Exam: Deep Peroneal normal   Assessment:    3 Days Post-Op  Procedure(s) (LRB): TOTAL KNEE ARTHROPLASTY (Left)  ADDITIONAL DIAGNOSIS:  Active Problems:   Acute pulmonary embolism   Hypoxemia   Pulmonary embolism  Acute Blood Loss Anemia   Plan: Physical Therapy as ordered Weight Bearing as Tolerated (WBAT)  DVT Prophylaxis:  Lovenox and Coumadin  DISCHARGE PLAN: Home  DISCHARGE NEEDS: HHPT, CPM, Walker and 3-in-1 comode seat  CCM seeing patient for Acute PE  [name] 09/18/2013, 12:55 PM

## 2013-09-18 NOTE — Progress Notes (Signed)
Physical Therapy Treatment Patient Details Name: [name] MRN: [id number] DOB: 1946-11-12 Today's Date: 09/18/2013 Time: 1478-2956 PT Time Calculation (min): 43 min  PT Assessment / Plan / Recommendation  History of Present Illness [name]    PT Comments   Pt cont's to move well but reports dizziness + nausea with activity.  Trialed ambulation with/without supplemental 02.  Sp02 remained >90% on 2L 02 & dropped to 86-88% RA.       Follow Up Recommendations  Home health PT;Supervision/Assistance - 24 hour     Does the patient have the potential to tolerate intense rehabilitation     Barriers to Discharge        Equipment Recommendations       Recommendations for Other Services    Frequency 7X/week   Progress towards PT Goals Progress towards PT goals: Progressing toward goals  Plan Current plan remains appropriate    Precautions / Restrictions Precautions Precautions: Fall;Knee Other Brace/Splint: footsie roll  Restrictions LLE Weight Bearing: Weight bearing as tolerated   Pertinent Vitals/Pain 02 sats:   96% 3L upon arrival                 85-88% RA with bed mobility                 86-88% RA with ambulation                 >90% 2L with ambulation    Mobility  Bed Mobility Bed Mobility: Supine to Sit;Sitting - Scoot to Edge of Bed Supine to Sit: 5: Supervision;HOB elevated;With rails Sitting - Scoot to Edge of Bed: 5: Supervision Details for Bed Mobility Assistance: Incr time but able to complete by herself.  Pt states she has a hospital bed at home.   Transfers Transfers: Sit to Stand;Stand to Sit Sit to Stand: 4: Min guard;With upper extremity assist;From bed Stand to Sit: With upper extremity assist;With armrests;To chair/3-in-1;5: Supervision Ambulation/Gait Ambulation/Gait Assistance: 4: Min guard Ambulation Distance (Feet): 180 Feet Assistive device: Rolling walker Ambulation/Gait Assistance Details: Sp02 remained >90% on 2L but dropped  to 86-88% RA.  Pt c/o mild dizziness & nausea Gait Pattern: Step-through pattern;Decreased step length - right Stairs: No Wheelchair Mobility Wheelchair Mobility: No    Exercises Total Joint Exercises Ankle Circles/Pumps: AROM;Both;15 reps Quad Sets: AROM;Both;10 reps Heel Slides: AAROM;Strengthening;Left;10 reps Hip ABduction/ADduction: Strengthening;Left;10 reps;AROM Straight Leg Raises: AAROM;Strengthening;Left;10 reps Long Arc Quad: AROM;Strengthening;Left;10 reps      PT Goals (current goals can now be found in the care plan section) Acute Rehab PT Goals PT Goal Formulation: With patient/family Time For Goal Achievement: 09/22/13 Potential to Achieve Goals: Good  Visit Information  Last PT Received On: 09/18/13 Assistance Needed: +1 History of Present Illness: [name]     Subjective Data      Cognition  Cognition Arousal/Alertness: Awake/alert Behavior During Therapy: WFL for tasks assessed/performed Overall Cognitive Status: Within Functional Limits for tasks assessed    Balance     End of Session PT - End of Session Equipment Utilized During Treatment: Gait belt;Oxygen Activity Tolerance: Patient tolerated treatment well Patient left: in chair;with call bell/phone within reach Nurse Communication: Mobility status   GP     [name] 09/18/2013, 2:14 PM  [name], PTA [phone] 09/18/2013

## 2013-09-18 NOTE — Progress Notes (Signed)
ANTICOAGULATION CONSULT NOTE - Follow Up Consult  Pharmacy Consult for Heparin Indication: pulmonary embolus  Allergies  Allergen Reactions  . Amaryl Nausea Only  . Codeine Nausea And Vomiting  . Crestor [Rosuvastatin Calcium]   . Lipitor [Atorvastatin Calcium]     MYALGIAS  . Lovastatin     MYALGIAS   . Niaspan [Niacin Er] Nausea Only  . Sumycin [Tetracycline Hcl]   . Zetia [Ezetimibe]   . Zithromax [Azithromycin]   . Zocor [Simvastatin - High Dose]     MYALGIAS  . Zocor [Simvastatin]     Muscle pain    Patient Measurements: Height: 5\' 2"  (157.5 cm) Weight: 199 lb (90.266 kg) IBW/kg (Calculated) : 50.1 Heparin Dosing Weight: 73 kg  Vital Signs: Temp: 99.7 F (37.6 C) (10/02 0211) Temp src: Oral (10/02 0211) BP: 107/64 mmHg (10/02 0211) Pulse Rate: 96 (10/02 0211)  Labs:  Recent Labs  09/16/13 0430 09/17/13 0435 09/18/13 0318  HGB 10.7* 10.4* 9.8*  HCT 32.4* 31.3* 29.6*  PLT 258 241 238  HEPARINUNFRC  --   --  0.14*  CREATININE 0.84 0.81 0.72    Estimated Creatinine Clearance: 72.3 ml/min (by C-G formula based on Cr of 0.72).   Medical History: Past Medical History  Diagnosis Date  . Neurofibromatosis   . Diabetes mellitus   . HTN (hypertension)   . Obesity   . Osteoarthritis, knee   . Hyperlipidemia     statin intolerant. LDL is 83  . UTI (lower urinary tract infection) 05/29/12  . STEMI (ST elevation myocardial infarction)     Inferolateral STEMI 05/29/12 s/p DES to RCA, nl EF    Assessment: 67 year old female with a cardiac history, HTN, and diabetes who underwent knee replacement 09/15/13.  Now tachycardiac and hypoxic. CT confirmed PE.  Beginning IV heparin.  Received last dose of Lovenox 30 mg this AM.  Initial heparin level 0.14 units/ml.  Goal of Therapy:  Heparin level 0.3-0.7 units/ml Monitor platelets by anticoagulation protocol: Yes   Plan:  1) Heparin 1000 units iv bolus x 1 (smaller bolus due to surgery) 2) Increase heparin  drip to 1600 units / hr 3) Heparin level in 6 hours  Thank you. Talbert Cage, PharmD [phone]  09/18/2013,4:25 AM

## 2013-09-18 NOTE — Progress Notes (Signed)
*  PRELIMINARY RESULTS* Vascular Ultrasound Lower extremity venous duplex has been completed.  Preliminary findings: no evidence of DVT.  Farrel Demark, RDMS, RVT  09/18/2013, 10:21 AM

## 2013-09-19 ENCOUNTER — Inpatient Hospital Stay (HOSPITAL_COMMUNITY): Payer: Medicare Other

## 2013-09-19 LAB — CBC
MCHC: 32.4 g/dL (ref 30.0–36.0)
MCV: 83.5 fL (ref 78.0–100.0)
Platelets: 255 10*3/uL (ref 150–400)
RDW: 14.1 % (ref 11.5–15.5)
WBC: 8.2 10*3/uL (ref 4.0–10.5)

## 2013-09-19 LAB — BASIC METABOLIC PANEL
CO2: 33 mEq/L — ABNORMAL HIGH (ref 19–32)
Calcium: 8.2 mg/dL — ABNORMAL LOW (ref 8.4–10.5)
Creatinine, Ser: 0.77 mg/dL (ref 0.50–1.10)
GFR calc Af Amer: >90 mL/min (ref >90)
Glucose, Bld: 125 mg/dL — ABNORMAL HIGH (ref 70–99)

## 2013-09-19 LAB — HEPARIN LEVEL (UNFRACTIONATED)
Heparin Unfractionated: 0.35 IU/mL (ref 0.30–0.70)
Heparin Unfractionated: 0.28 IU/mL — ABNORMAL LOW (ref 0.30–0.70)

## 2013-09-19 LAB — PROTIME-INR: INR: 1.27 (ref 0.00–1.49)

## 2013-09-19 LAB — GLUCOSE, CAPILLARY
Glucose-Capillary: 133 mg/dL — ABNORMAL HIGH (ref 70–99)
Glucose-Capillary: 141 mg/dL — ABNORMAL HIGH (ref 70–99)

## 2013-09-19 MED ORDER — WARFARIN SODIUM 7.5 MG PO TABS
7.5000 mg | ORAL_TABLET | Freq: Once | ORAL | Status: AC
  Administered 2013-09-19: 7.5 mg via ORAL
  Filled 2013-09-19: qty 1

## 2013-09-19 MED ORDER — HEPARIN (PORCINE) IN NACL 100-0.45 UNIT/ML-% IJ SOLN
2350.0000 [IU]/h | INTRAMUSCULAR | Status: DC
  Administered 2013-09-19 – 2013-09-21 (×6): 2250 [IU]/h via INTRAVENOUS
  Administered 2013-09-22: 2350 [IU]/h via INTRAVENOUS
  Filled 2013-09-19 (×10): qty 250

## 2013-09-19 MED ORDER — ALBUTEROL SULFATE (5 MG/ML) 0.5% IN NEBU
2.5000 mg | INHALATION_SOLUTION | RESPIRATORY_TRACT | Status: DC | PRN
  Administered 2013-09-20 (×2): 2.5 mg via RESPIRATORY_TRACT
  Filled 2013-09-19 (×2): qty 0.5

## 2013-09-19 MED ORDER — HEPARIN (PORCINE) IN NACL 100-0.45 UNIT/ML-% IJ SOLN
2150.0000 [IU]/h | INTRAMUSCULAR | Status: DC
  Filled 2013-09-19 (×2): qty 250

## 2013-09-19 NOTE — Progress Notes (Signed)
PULMONARY  / CRITICAL CARE MEDICINE  Name: [name] MRN: [id number] DOB: 1946/03/08    ADMISSION DATE:  09/15/2013 CONSULTATION DATE:  09/17/2013  REFERRING MD :  Dr. Sherlean Foot PRIMARY SERVICE:  Orthopedics  CHIEF COMPLAINT:  Hypoxemia  BRIEF PATIENT DESCRIPTION: 67 year old with a cardiac history, HTN and diabetes presenting to the hospital for left knee replacement.  On 10/1 patient was found to be tachycardic and hypoxic.  CTA was performed that showed a pulmonary embolism and PCCM was called on consultation.  Patient denies previous blood clots or family history of blood clots.  Has not been on blood thinners in the past.  No hemoptysis or chest pain.  SIGNIFICANT EVENTS / STUDIES:  10/1 CTA Chest:  Moderate sized PE 10/02 Venous Dopplers: no evidence DVT (prelim) 10/02 Echo:  10-3 remains on O2   SUBJECTIVE:  No new complaints. No distress  VITAL SIGNS: Temp:  [99 F (37.2 C)-99.9 F (37.7 C)] 99.4 F (37.4 C) (10/03 0623) Pulse Rate:  [95-108] 95 (10/03 0623) Resp:  [18] 18 (10/03 0623) BP: (111-131)/(58-65) 111/58 mmHg (10/03 0623) SpO2:  [91 %-96 %] 96 % (10/03 0623)  PHYSICAL EXAMINATION: General:  NAD Neuro:  No focal deficits. HEENT:  WNL Cardiovascular:  RRR s M Lungs:  Clear Abdomen:  Soft, NT, ND and +BS. Ext: no edema   Recent Labs Lab 09/17/13 0435 09/18/13 0318 09/19/13 0445  NA 132* 138 136  K 4.0 4.0 4.9  CL 96 102 98  CO2 27 29 33*  BUN 11 10 10   CREATININE 0.81 0.72 0.77  GLUCOSE 175* 143* 125*    Recent Labs Lab 09/17/13 0435 09/18/13 0318 09/19/13 0445  HGB 10.4* 9.8* 9.5*  HCT 31.3* 29.6* 29.3*  WBC 12.2* 11.0* 8.2  PLT 241 238 255     ASSESSMENT / PLAN: Post op PE -Cont heparin infusion -Cont warfarin (started 10/02) -Will need 2 days of therapeutic overlap (INR > 2) before discontinuation of heparin -F/U Echo results -6 months duration  -Early discharge can be facilitated by SQ LMWH while awaiting therapeutic  PT-INRs -Would like to see her off O2 prior to discharge if possible.  -F/U echocardiogram results  We will see again Mon 10/06. Please call sooner PRN   [facility] Minor ACNP Adolph Pollack PCCM Pager [phone] till 3 pm If no answer page [phone] 09/19/2013, 12:26 PM  I have interviewed and examined the patient and reviewed the database. I have formulated the assessment and plan as reflected in the note above with amendments made by me.   Billy Fischer, MD;  PCCM service; Mobile [phone]

## 2013-09-19 NOTE — Progress Notes (Signed)
Physical Therapy Treatment Patient Details Name: [name] MRN: [id number] DOB: 1946-10-16 Today's Date: 09/19/2013 Time: 1478-2956 PT Time Calculation (min): 24 min  PT Assessment / Plan / Recommendation  History of Present Illness [name]    PT Comments   Pt still progressing.  No nausea/dizziness noted with ambulation today however cont's to desat to mid 80's without supplemental 02.     Follow Up Recommendations  Home health PT;Supervision/Assistance - 24 hour     Does the patient have the potential to tolerate intense rehabilitation     Barriers to Discharge        Equipment Recommendations       Recommendations for Other Services    Frequency 7X/week   Progress towards PT Goals Progress towards PT goals: Progressing toward goals  Plan Current plan remains appropriate    Precautions / Restrictions Precautions Precautions: Fall;Knee Other Brace/Splint: footsie roll  Restrictions LLE Weight Bearing: Weight bearing as tolerated   Pertinent Vitals/Pain 02 sats:  92% 2.5 L upon arrival                Dropped to 85% RA with ambulation                92% 2L with ambulation    Mobility  Transfers Transfers: Sit to Stand;Stand to Sit Sit to Stand: 5: Supervision;With upper extremity assist;With armrests;From chair/3-in-1 Stand to Sit: 5: Supervision;With upper extremity assist;With armrests;To chair/3-in-1 Ambulation/Gait Ambulation/Gait Assistance: 5: Supervision Ambulation Distance (Feet): 180 Feet Assistive device: Rolling walker Ambulation/Gait Assistance Details: Pt cont's to progress with fluidity of gait & step-through pattern Gait Pattern: Step-through pattern;Decreased stride length;Decreased step length - right Gait velocity: decreased but improving General Gait Details: Sp02 dropped to 85% RA.  93% 2L via [place].   Stairs: No Wheelchair Mobility Wheelchair Mobility: No    Exercises Total Joint Exercises Ankle Circles/Pumps: AROM;Both;10  reps Quad Sets: AROM;Left;5 reps Straight Leg Raises: AAROM;Strengthening;Left;10 reps Long Arc Quad: AROM;Strengthening;Left;10 reps Knee Flexion: AAROM;Left;10 reps;Seated (in crecliner) Goniometric ROM: AAROM Lt knee flexion ~75 degrees sitting        PT Goals (current goals can now be found in the care plan section) Acute Rehab PT Goals PT Goal Formulation: With patient/family Time For Goal Achievement: 09/22/13 Potential to Achieve Goals: Good  Visit Information  Last PT Received On: 09/19/13 Assistance Needed: +1 History of Present Illness: [name]     Subjective Data      Cognition  Cognition Arousal/Alertness: Awake/alert Behavior During Therapy: WFL for tasks assessed/performed Overall Cognitive Status: Within Functional Limits for tasks assessed    Balance     End of Session PT - End of Session Equipment Utilized During Treatment: Gait belt Activity Tolerance: Patient tolerated treatment well Patient left: in chair;with call bell/phone within reach Nurse Communication: Mobility status   GP     Lara Mulch 09/19/2013, 1:06 PM  Verdell Face, PTA [phone] 09/19/2013

## 2013-09-19 NOTE — Progress Notes (Signed)
SPORTS MEDICINE AND JOINT REPLACEMENT  Georgena Spurling, MD   Altamese Cabal, PA-C [address] [place], [place], [place]  [address]                             [phone]   PROGRESS NOTE  Subjective:  negative for Chest Pain  negative for Shortness of Breath  negative for Nausea/Vomiting   negative for Calf Pain  negative for Bowel Movement   Tolerating Diet: yes         Patient reports pain as 4 on 0-10 scale.    Objective: Vital signs in last 24 hours:   Patient Vitals for the past 24 hrs:  BP Temp Temp src Pulse Resp SpO2  09/19/13 0623 111/58 mmHg 99.4 F (37.4 C) Oral 95 18 96 %  09/19/13 0210 - - - 101 - 91 %  09/18/13 2135 - - - 108 - 93 %  09/18/13 2009 131/65 mmHg 99.9 F (37.7 C) Oral 104 18 94 %  09/18/13 1600 - - - - 18 92 %  09/18/13 1300 120/59 mmHg 99 F (37.2 C) Oral 101 18 92 %  09/18/13 1200 - - - - 20 93 %    @flow {1959:LAST@   Intake/Output from previous day:   10/02 0701 - 10/03 0700 In: 1821.5 [P.O.:720; I.V.:1101.5] Out: -    Intake/Output this shift:       Intake/Output     10/02 0701 - 10/03 0700 10/03 0701 - 10/04 0700   P.O. 720    I.V. (mL/kg) 1101.5 (12.2)    Total Intake(mL/kg) 1821.5 (20.2)    Urine (mL/kg/hr)     Total Output       Net +1821.5          Urine Occurrence 5 x    Stool Occurrence 1 x       LABORATORY DATA:  Recent Labs  09/15/13 1225 09/16/13 0430 09/17/13 0435 09/18/13 0318 09/19/13 0445  WBC 10.6* 9.9 12.2* 11.0* 8.2  HGB 11.9* 10.7* 10.4* 9.8* 9.5*  HCT 34.8* 32.4* 31.3* 29.6* 29.3*  PLT 262 258 241 238 255    Recent Labs  09/15/13 1225 09/16/13 0430 09/17/13 0435 09/18/13 0318 09/19/13 0445  NA  --  135 132* 138 136  K  --  4.2 4.0 4.0 4.9  CL  --  98 96 102 98  CO2  --  26 27 29  33*  BUN  --  12 11 10 10   CREATININE 0.85 0.84 0.81 0.72 0.77  GLUCOSE  --  161* 175* 143* 125*  CALCIUM  --  8.2* 8.2* 8.0* 8.2*   Lab Results  Component Value Date   INR 1.27 09/19/2013   INR 0.99  09/08/2013   INR 1.03 10/04/2009    Examination:  General appearance: alert, cooperative and no distress Extremities: Homans sign is negative, no sign of DVT  Wound Exam: clean, dry, intact   Drainage:  None: wound tissue dry  Motor Exam: EHL and FHL Intact  Sensory Exam: Deep Peroneal normal   Assessment:    4 Days Post-Op  Procedure(s) (LRB): TOTAL KNEE ARTHROPLASTY (Left)  ADDITIONAL DIAGNOSIS:  Active Problems:   Acute pulmonary embolism   Hypoxemia   Pulmonary embolism  Acute Blood Loss Anemia   Plan: Physical Therapy as ordered Weight Bearing as Tolerated (WBAT)  DVT Prophylaxis: lovenox bridge, warfarin  DISCHARGE PLAN: Home  DISCHARGE NEEDS: HHPT, HHRN, CPM, Walker and 3-in-1 comode  seat     Critical care for PE    Eh Sesay 09/19/2013, 11:00 AM

## 2013-09-19 NOTE — Progress Notes (Signed)
eLink Physician-Brief Progress Note Patient Name: [name] DOB: [birth year] MRN: [id number]  Date of Service  09/19/2013   HPI/Events of Note   I was called by the nurse; the patient with increased oxygen requirements (on 3 lpm from 21/2 lpm) and slight increase in work of breathing with wheezing  eICU Interventions  CXR, sputum culture and Albuterol prn ordered       [facility] 09/19/2013, 11:30 PM

## 2013-09-19 NOTE — Progress Notes (Signed)
ANTICOAGULATION CONSULT NOTE - Follow Up Consult  Pharmacy Consult for heparin and warfarin Indication: pulmonary embolus  Allergies  Allergen Reactions  . Amaryl Nausea Only  . Codeine Nausea And Vomiting  . Crestor [Rosuvastatin Calcium]   . Lipitor [Atorvastatin Calcium]     MYALGIAS  . Lovastatin     MYALGIAS   . Niaspan [Niacin Er] Nausea Only  . Sumycin [Tetracycline Hcl]   . Zetia [Ezetimibe]   . Zithromax [Azithromycin]   . Zocor [Simvastatin - High Dose]     MYALGIAS  . Zocor [Simvastatin]     Muscle pain    Patient Measurements: Height: 5\' 2"  (157.5 cm) Weight: 199 lb (90.266 kg) IBW/kg (Calculated) : 50.1 Heparin Dosing Weight: 73kg  Vital Signs: Temp: 99.4 F (37.4 C) (10/03 0623) Temp src: Oral (10/03 0623) BP: 111/58 mmHg (10/03 0623) Pulse Rate: 95 (10/03 0623)  Labs:  Recent Labs  09/17/13 0435 09/18/13 0318  09/18/13 2000 09/19/13 0445 09/19/13 1140  HGB 10.4* 9.8*  --   --  9.5*  --   HCT 31.3* 29.6*  --   --  29.3*  --   PLT 241 238  --   --  255  --   LABPROT  --   --   --   --  15.6*  --   INR  --   --   --   --  1.27  --   HEPARINUNFRC  --  0.14*  < > 0.16* 0.35 0.28*  CREATININE 0.81 0.72  --   --  0.77  --   < > = values in this interval not displayed.  Estimated Creatinine Clearance: 72.3 ml/min (by C-G formula based on Cr of 0.77).   Medications:  Heparin at 2050units/hr  Assessment: 4 YOF who underwent knee replacement on 9/29 and was now found to have a PE and started on heparin + warfarin. Her first 3 heparin levels were subtherapeutic and she did reach goal range early this morning. However, a confirmatory level decreased to below goal this afternoon. Per RN, the drip was not stopped and there have no been issues with the line. INR this morning is 1.27. Her platelets remain WNL, though Hgb has dropped since surgery.   Goals: INR 2-3 Heparin level 0.3-0.7 units/ml Monitor platelets by anticoagulation protocol: Yes    Plan:  1. Increase heparin drip to 2150 units/hr 2. Heparin level in 6 hours 3. Warfarin 7.5mg  po x1 tonight 4. Daily INR, heparin level and CBC 5. Education to be provided 6. Follow up possible transition to Lovenox, any signs of bleeding/clotting and clinical progression  Seichi Kaufhold D. Edwin Baines, PharmD Clinical Pharmacist Pager: [phone] 09/19/2013 12:48 PM

## 2013-09-19 NOTE — Progress Notes (Signed)
ANTICOAGULATION CONSULT NOTE - Follow Up Consult  Pharmacy Consult for heparin  Indication: pulmonary embolus  Allergies  Allergen Reactions  . Amaryl Nausea Only  . Codeine Nausea And Vomiting  . Crestor [Rosuvastatin Calcium]   . Lipitor [Atorvastatin Calcium]     MYALGIAS  . Lovastatin     MYALGIAS   . Niaspan [Niacin Er] Nausea Only  . Sumycin [Tetracycline Hcl]   . Zetia [Ezetimibe]   . Zithromax [Azithromycin]   . Zocor [Simvastatin - High Dose]     MYALGIAS  . Zocor [Simvastatin]     Muscle pain    Patient Measurements: Height: 5\' 2"  (157.5 cm) Weight: 199 lb (90.266 kg) IBW/kg (Calculated) : 50.1 Heparin Dosing Weight: 73kg  Vital Signs: Temp: 99.9 F (37.7 C) (10/02 2009) Temp src: Oral (10/02 2009) BP: 131/65 mmHg (10/02 2009) Pulse Rate: 101 (10/03 0210)  Labs:  Recent Labs  09/17/13 0435  09/18/13 0318 09/18/13 1245 09/18/13 2000 09/19/13 0445  HGB 10.4*  --  9.8*  --   --  9.5*  HCT 31.3*  --  29.6*  --   --  29.3*  PLT 241  --  238  --   --  255  LABPROT  --   --   --   --   --  15.6*  INR  --   --   --   --   --  1.27  HEPARINUNFRC  --   < > 0.14* 0.13* 0.16* 0.35  CREATININE 0.81  --  0.72  --   --   --   < > = values in this interval not displayed.  Estimated Creatinine Clearance: 72.3 ml/min (by C-G formula based on Cr of 0.72).   Medications:  Heparin at 2050 units/hr  Assessment: 85 YOF who underwent knee replacement on 9/29 and was now found to have a PE and started on heparin. Heparin level now therapeutic  Goal of Therapy:  INR 2-3 Heparin level 0.3-0.7 units/ml Monitor platelets by anticoagulation protocol: Yes   Plan:   -Cont heparin at 2050 units/hr -Heparin level in 6 hrs to confirm  Talbert Cage, Pharm D 09/19/2013 5:30 AM

## 2013-09-19 NOTE — Progress Notes (Signed)
Utilization review completed.  

## 2013-09-19 NOTE — Progress Notes (Signed)
Anticoagulation Consult Note: Heparin Indication: DVT  Heparin level = 0.3 on 2150 units/hr Goal heparin level = 0.3-0.7  The heparin level is in the desired therapeutic range but just barely. Will increase the rate to 2250 units/hr to ensure the heparin level remains therapeutic. Will F/U heparin level in the AM.  Cardell Peach, PharmD

## 2013-09-20 DIAGNOSIS — I10 Essential (primary) hypertension: Secondary | ICD-10-CM

## 2013-09-20 DIAGNOSIS — R079 Chest pain, unspecified: Secondary | ICD-10-CM

## 2013-09-20 LAB — CBC
HCT: 29.8 % — ABNORMAL LOW (ref 36.0–46.0)
HCT: 31.8 % — ABNORMAL LOW (ref 36.0–46.0)
Hemoglobin: 9.9 g/dL — ABNORMAL LOW (ref 12.0–15.0)
MCH: 27.7 pg (ref 26.0–34.0)
MCHC: 33.2 g/dL (ref 30.0–36.0)
MCHC: 34 g/dL (ref 30.0–36.0)
MCV: 83.2 fL (ref 78.0–100.0)
MCV: 82.4 fL (ref 78.0–100.0)
Platelets: 321 10*3/uL (ref 150–400)
RBC: 3.58 MIL/uL — ABNORMAL LOW (ref 3.87–5.11)
RDW: 14 % (ref 11.5–15.5)
RDW: 13.9 % (ref 11.5–15.5)
WBC: 10.6 10*3/uL — ABNORMAL HIGH (ref 4.0–10.5)

## 2013-09-20 LAB — BASIC METABOLIC PANEL
CO2: 32 mEq/L (ref 19–32)
Creatinine, Ser: 0.75 mg/dL (ref 0.50–1.10)
Glucose, Bld: 128 mg/dL — ABNORMAL HIGH (ref 70–99)
Potassium: 4.3 mEq/L (ref 3.5–5.1)
Sodium: 141 mEq/L (ref 135–145)

## 2013-09-20 LAB — EXPECTORATED SPUTUM ASSESSMENT W GRAM STAIN, RFLX TO RESP C: Special Requests: NORMAL

## 2013-09-20 LAB — GLUCOSE, CAPILLARY
Glucose-Capillary: 145 mg/dL — ABNORMAL HIGH (ref 70–99)
Glucose-Capillary: 201 mg/dL — ABNORMAL HIGH (ref 70–99)
Glucose-Capillary: 122 mg/dL — ABNORMAL HIGH (ref 70–99)

## 2013-09-20 LAB — PROTIME-INR: Prothrombin Time: 21.3 seconds — ABNORMAL HIGH (ref 11.6–15.2)

## 2013-09-20 LAB — COMPREHENSIVE METABOLIC PANEL
AST: 24 U/L (ref 0–37)
Albumin: 2.4 g/dL — ABNORMAL LOW (ref 3.5–5.2)
BUN: 11 mg/dL (ref 6–23)
Calcium: 8.9 mg/dL (ref 8.4–10.5)
Chloride: 97 mEq/L (ref 96–112)
Creatinine, Ser: 0.66 mg/dL (ref 0.50–1.10)
Glucose, Bld: 168 mg/dL — ABNORMAL HIGH (ref 70–99)
Total Bilirubin: 0.4 mg/dL (ref 0.3–1.2)

## 2013-09-20 LAB — EXPECTORATED SPUTUM ASSESSMENT W REFEX TO RESP CULTURE

## 2013-09-20 LAB — TROPONIN I: Troponin I: <0.3 ng/mL (ref <0.30)

## 2013-09-20 MED ORDER — MORPHINE SULFATE 2 MG/ML IJ SOLN
1.0000 mg | Freq: Once | INTRAMUSCULAR | Status: AC
  Administered 2013-09-20: 1 mg via INTRAVENOUS

## 2013-09-20 MED ORDER — MORPHINE SULFATE 2 MG/ML IJ SOLN
INTRAMUSCULAR | Status: AC
  Filled 2013-09-20: qty 1

## 2013-09-20 MED ORDER — WARFARIN SODIUM 5 MG PO TABS
5.0000 mg | ORAL_TABLET | Freq: Once | ORAL | Status: AC
  Administered 2013-09-20: 5 mg via ORAL
  Filled 2013-09-20: qty 1

## 2013-09-20 NOTE — Progress Notes (Signed)
Physical Therapy Treatment Patient Details Name: [name] MRN: [id number] DOB: 07-31-1946 Today's Date: 09/20/2013 Time: 4098-1191 PT Time Calculation (min): 23 min  PT Assessment / Plan / Recommendation  History of Present Illness [name]    PT Comments   Pt progressing as able toward goals. Limited by nausea and vomiting this am. Acute PT to continue to progress mobility as pt is able.   Follow Up Recommendations  Home health PT;Supervision/Assistance - 24 hour     Equipment Recommendations  Other (comment) (pt has all needed DME at this time)    Frequency 7X/week   Progress towards PT Goals Progress towards PT goals: Progressing toward goals  Plan Current plan remains appropriate    Precautions / Restrictions Precautions Precautions: Fall;Knee;Other (comment) Precaution Comments: monitor SaO2 levels with mobility Restrictions LLE Weight Bearing: Weight bearing as tolerated       Mobility  Bed Mobility Supine to Sit: 4: Min assist;HOB flat;With rails Sitting - Scoot to Edge of Bed: 5: Supervision Details for Bed Mobility Assistance: Incr time needed. min assist to complete trunk transition into sitting today due to nausea. Transfers Sit to Stand: 5: Supervision;From bed;With upper extremity assist Stand to Sit: 5: Supervision;To chair/3-in-1;With upper extremity assist Details for Transfer Assistance: cues for hand placement with transfers Ambulation/Gait Ambulation/Gait Assistance: 5: Supervision Ambulation Distance (Feet): 5 Feet Assistive device: Rolling walker Ambulation/Gait Assistance Details: pivotal steps from bed to recliner. Distance limited by nausea this am. Pt did vomit x1 after sitting in recliner. min cues for posture and walker use/position with gait. Gait Pattern: Step-through pattern;Narrow base of support;Trunk flexed;Decreased stride length General Gait Details: SaO2 remained >95 on 3 lpm (was increased by nursing over night).     Exercises Total Joint Exercises Ankle Circles/Pumps: AROM;Both;10 reps;Supine Quad Sets: AROM;Strengthening;Left;10 reps;Supine Short Arc Quad: AROM;Strengthening;Left;10 reps;Supine Heel Slides: AAROM;Strengthening;Left;10 reps;Supine Hip ABduction/ADduction: AAROM;Strengthening;Left;10 reps;Supine Straight Leg Raises: AAROM;Strengthening;Left;10 reps;Supine Goniometric ROM: AROM lt knee supine in bed: 85 degrees flexion      PT Goals (current goals can now be found in the care plan section) Acute Rehab PT Goals Patient Stated Goal: to go home with husband when im ready PT Goal Formulation: With patient/family Time For Goal Achievement: 09/22/13 Potential to Achieve Goals: Good  Visit Information  Last PT Received On: 09/20/13 Assistance Needed: +1 History of Present Illness: [name]     Subjective Data  Patient Stated Goal: to go home with husband when im ready   Cognition  Cognition Arousal/Alertness: Awake/alert Behavior During Therapy: WFL for tasks assessed/performed Overall Cognitive Status: Within Functional Limits for tasks assessed       End of Session PT - End of Session Equipment Utilized During Treatment: Gait belt;Oxygen Activity Tolerance: Treatment limited secondary to medical complications (Comment) (pt nauseated with session this am) Patient left: in chair;with call bell/phone within reach;with nursing/sitter in room;with family/visitor present Nurse Communication: Mobility status;Other (comment) (that pt vomited after getting to recliner) CPM Left Knee CPM Left Knee: Off Left Knee Flexion (Degrees): 90 Left Knee Extension (Degrees): 0   GP     Sallyanne Kuster 09/20/2013, 8:57 AM  Sallyanne Kuster, PTA Office- [phone]

## 2013-09-20 NOTE — Progress Notes (Signed)
eLink Physician-Brief Progress Note Patient Name: [name] DOB: 26-Jan-1946 MRN: [id number]  Date of Service  09/20/2013   HPI/Events of Note  I was called by the bedside nurse and the rapid response nurse;  The patient complains of chest pain; epigastric; lasted for 20 min; not associated with dyspnea or increased work of breathing, no palpitations; different from the pain that lead to the diagnosis of PE.   EKG with S1, Q3, T3. Consistent with her prior diagnosis of PE; nonspecific ST changes in the lateral leads.    eICU Interventions  Plan: obtain troponin, cycle EKG; consult cardiology.       [name] 09/20/2013, 4:04 PM

## 2013-09-20 NOTE — Consult Note (Signed)
CARDIOLOGY CONSULT NOTE       Patient ID: [name] MRN: [id number] DOB/AGE: 1946-04-15 67 y.o.  Admit date: 09/15/2013 Referring Physician Vanetta Mulders MD Primary Physician Dr. Rubin Payor Primary Cardiologist Cassell Clement MD Reason for Consultation chest pain.  HPI: [name] is a pleasant 67 year old white female well-known to me. She has a history of diabetes mellitus, hypertension, and obesity. She has a history of coronary disease and is status post stenting of the right coronary in June of 2013 when she presented with an inferior STEMI. She took dual antiplatelet therapy for one year. She was admitted on 09/15/2013 and underwent a left total knee replacement. On 09/17/2013 she developed acute shortness of breath. CT of the chest showed a large pulmonary embolus. She was anticoagulated with heparin and started on Coumadin. She has done well up until this evening. She states that she was just getting out of her knee mobilizer when she developed a sharp mid substernal chest pain. It did not radiate. It was not associated with increased dyspnea. It was relieved with morphine. She states it was different than the symptoms she had with her heart attack. She feels much better now.  Review of systems complete and found to be negative unless listed above   Past Medical History  Diagnosis Date  . Neurofibromatosis   . Diabetes mellitus   . HTN (hypertension)   . Obesity   . Osteoarthritis, knee   . Hyperlipidemia     statin intolerant. LDL is 83  . UTI (lower urinary tract infection) 05/29/12  . STEMI (ST elevation myocardial infarction)     Inferolateral STEMI 05/29/12 s/p DES to RCA, nl EF    Family History  Problem Relation Age of Onset  . Heart disease Mother   . Arthritis Mother   . Stroke Mother   . Heart disease Son     History   Social History  . Marital Status: Married    Spouse Name: N/A    Number of Children: N/A  . Years of Education: N/A    Occupational History  . Not on file.   Social History Main Topics  . Smoking status: Never Smoker   . Smokeless tobacco: Never Used  . Alcohol Use: No  . Drug Use: No  . Sexual Activity: Not Currently   Other Topics Concern  . Not on file   Social History Narrative  . No narrative on file    Past Surgical History  Procedure Laterality Date  . Cardiac catheterization    . Tonsillectomy and adenoidectomy    . Clavicle surgery    . Abdominal hysterectomy    . Ankle fracture surgery Right   . Total knee arthroplasty Left 09/15/2013    Procedure: TOTAL KNEE ARTHROPLASTY;  Surgeon: Dannielle Huh, MD;  Location: [facility] OR;  Service: Orthopedics;  Laterality: Left;     Prescriptions prior to admission  Medication Sig Dispense Refill  . aspirin 81 MG tablet Take 1 tablet (81 mg total) by mouth daily.      . Calcium Carbonate-Vitamin D (CALCIUM + D PO) Take 1 tablet by mouth daily.       Marland Kitchen CINNAMON PO Take 1 capsule by mouth daily as needed (for vitamin). OCCASIONALLY      . fish oil-omega-3 fatty acids 1000 MG capsule Take 1 g by mouth daily.       Marland Kitchen glimepiride (AMARYL) 2 MG tablet Take 4 mg by mouth daily before breakfast.      .  glucose blood test strip ONE TOUCH ULTRA TEST STRIPS, TEST DAILY DIAGNOSIS 250.02  100 each  12  . guaiFENesin (MUCINEX) 600 MG 12 hr tablet Take 600 mg by mouth 2 (two) times daily. PRN      . losartan (COZAAR) 25 MG tablet Take 25 mg by mouth daily.      . metFORMIN (GLUCOPHAGE) 1000 MG tablet Take 1 tablet (1,000 mg total) by mouth 2 (two) times daily.  180 tablet  3  . metoprolol succinate (TOPROL-XL) 25 MG 24 hr tablet Take 25 mg by mouth at bedtime.      . Multiple Vitamin (MULTIVITAMIN) tablet Take 1 tablet by mouth daily as needed (for vitamin).       . Multiple Vitamins-Minerals (OCUVITE PO) Take 1 tablet by mouth daily as needed (for vitamin - for eyes).       . nitroGLYCERIN (NITROSTAT) 0.4 MG SL tablet Place 1 tablet (0.4 mg total) under the tongue  every 5 (five) minutes as needed for chest pain (up to 3 doses).  25 tablet  3  . [DISCONTINUED] Ticagrelor (BRILINTA) 90 MG TABS tablet Take by mouth.        Physical Exam: Blood pressure 146/76, pulse 103, temperature 98.5 F (36.9 C), temperature source Oral, resp. rate 18, height 5\' 2"  (1.575 m), weight 199 lb (90.266 kg), SpO2 95.00%.  She is a pleasant, obese white female in no acute distress. HEENT: Normal Neck: No JVD or bruits. No adenopathy or thyromegaly. Lungs: Clear Cardiovascular: Regular rate and rhythm. Normal S1 and S2. No gallop, murmur, or click. PMI is normal. Abdomen: Obese, soft, nontender. No masses or hepatosplenomegaly. Extremities: No cyanosis or edema. Skin: Warm and dry Neuro: Alert and oriented x3. Cranial nerves II through XII are intact.  Labs:   Lab Results  Component Value Date   WBC 7.2 09/20/2013   HGB 9.9* 09/20/2013   HCT 29.8* 09/20/2013   MCV 83.2 09/20/2013   PLT 259 09/20/2013    Recent Labs Lab 09/20/13 0510  NA 141  K 4.3  CL 100  CO2 32  BUN 12  CREATININE 0.75  CALCIUM 8.6  GLUCOSE 128*   Lab Results  Component Value Date   CKTOTAL 256* 05/30/2012   CKMB 15.5* 05/30/2012   TROPONINI 11.35* 05/30/2012    Lab Results  Component Value Date   CHOL 195 05/22/2013   CHOL 212* 01/21/2013   CHOL 229* 07/09/2012   Lab Results  Component Value Date   HDL 39.40 05/22/2013   HDL 39.70 01/21/2013   HDL 49.80 07/09/2012   Lab Results  Component Value Date   LDLCALC 135* 05/22/2013   LDLCALC 83 05/31/2012   LDLCALC 140* 05/21/2012   Lab Results  Component Value Date   TRIG 103.0 05/22/2013   TRIG 107.0 01/21/2013   TRIG 75.0 07/09/2012   Lab Results  Component Value Date   CHOLHDL 5 05/22/2013   CHOLHDL 5 01/21/2013   CHOLHDL 5 07/09/2012   Lab Results  Component Value Date   LDLDIRECT 147.9 01/21/2013   LDLDIRECT 174.4 07/09/2012   LDLDIRECT 174.3 03/11/2012      Radiology:PORTABLE CHEST - 1 VIEW  COMPARISON: 01/21/2013  FINDINGS:  Low  volume lungs with an indistinct retrocardiac opacity.  Inspiration likely accounts for apparent cardiac enlargement. No  effusion, pneumothorax, or edema.  IMPRESSION:  1. Retrocardiac opacity which is likely either atelectasis or  infarct/reperfusion edema.  2. Low lung volumes.  Electronically Signed  By: Audry Riles.D.  On: 09/20/2013 00:07  CT ANGIOGRAPHY CHEST WITH CONTRAST  TECHNIQUE:  Multidetector CT imaging of the chest was performed using the  standard protocol during bolus administration of intravenous  contrast. Multiplanar CT image reconstructions including MIPs were  obtained to evaluate the vascular anatomy.  CONTRAST: 100 mL Omnipaque 350 nonionic  COMPARISON: Chest radiograph January 21, 2013 and CT angiogram  chest February 12, 2009  FINDINGS:  There is extensive pulmonary embolus the arising from the distal  right main pulmonary artery and extending in to proximal right lower  and lower lobe branches. On the left, there are multiple the left  lower lobe segmental pulmonary emboli as well as posterior segment  right upper lobe segmental pulmonary artery branch.  There is no thoracic aortic aneurysm or dissection.  There is patchy bibasilar consolidation. There is patchy atelectasis  in the lower lobes as well. There is a minimal effusion on the left.  There is no appreciable thoracic adenopathy. Pericardium is not  thickened.  In the visualized upper abdomen, there is fatty change in the liver.  There are no blastic or lytic bone lesions. Thyroid appears  unremarkable.  Review of the MIP images confirms the above findings.  IMPRESSION:  Extensive pulmonary embolus bilaterally with pulmonary embolus on  the right arising in the distal right main pulmonary artery. There  are pulmonary emboli in bilateral upper and lower lobe branches the.  There is patchy infiltrate in both lower lobes. Small left effusion.  Fatty liver.  Critical Value/emergent results  were called by telephone at the time  of interpretation on 09/17/2013 at 3:06 PM to [facility] , who  verbally acknowledged these results.  Electronically Signed  By: Bretta Bang  On: 09/17/2013 15:07    EKG: Sinus tachycardia with a rate of 103 beats per minute. Q waves inferiorly. No acute ST or T wave changes. This is unchanged compared to October 2013.  Echo:Study Conclusions  - Left ventricle: The cavity size was normal. Wall thickness was increased in a pattern of moderate LVH. Systolic function was vigorous. The estimated ejection fraction was in the range of 65% to 70%. Wall motion was normal; there were no regional wall motion abnormalities. - Pulmonary arteries: Systolic pressure was mildly to moderately increased. PA peak pressure: 47mm Hg (S).   ASSESSMENT AND PLAN:  1. Atypical chest pain. No evidence of active coronary ischemia. They may be residual from her pulmonary embolus or related to acid reflux. Cardiac enzymes are pending. LV function is normal by echocardiogram today. Recommend continued medical management. 2. Acute pulmonary embolus. Continue anticoagulation therapy. She is hemodynamically stable and oxygenation is good. RV function is normal by echo. 3. Status post left total knee replacement. 4. Coronary disease status post inferior myocardial infarction in June of 2013. Status post stenting of the right coronary.   SignedTheron Arista [facility] 09/20/2013, 4:34 PM

## 2013-09-20 NOTE — Progress Notes (Signed)
ANTICOAGULATION CONSULT NOTE - Follow Up Consult  Pharmacy Consult for heparin and warfarin Indication: pulmonary embolus  Allergies  Allergen Reactions  . Amaryl Nausea Only  . Codeine Nausea And Vomiting  . Crestor [Rosuvastatin Calcium]   . Lipitor [Atorvastatin Calcium]     MYALGIAS  . Lovastatin     MYALGIAS   . Niaspan [Niacin Er] Nausea Only  . Sumycin [Tetracycline Hcl]   . Zetia [Ezetimibe]   . Zithromax [Azithromycin]   . Zocor [Simvastatin - High Dose]     MYALGIAS  . Zocor [Simvastatin]     Muscle pain    Patient Measurements: Height: 5\' 2"  (157.5 cm) Weight: 199 lb (90.266 kg) IBW/kg (Calculated) : 50.1 Heparin Dosing Weight: 73kg  Vital Signs: Temp: 98.5 F (36.9 C) (10/04 0651) Temp src: Oral (10/04 0651) BP: 108/57 mmHg (10/04 0651) Pulse Rate: 96 (10/04 0651)  Labs:  Recent Labs  09/18/13 0318  09/19/13 0445 09/19/13 1140 09/19/13 2018 09/20/13 0510  HGB 9.8*  --  9.5*  --   --  9.9*  HCT 29.6*  --  29.3*  --   --  29.8*  PLT 238  --  255  --   --  259  LABPROT  --   --  15.6*  --   --  21.3*  INR  --   --  1.27  --   --  1.91*  HEPARINUNFRC 0.14*  < > 0.35 0.28* 0.30 0.44  CREATININE 0.72  --  0.77  --   --  0.75  < > = values in this interval not displayed.  Estimated Creatinine Clearance: 72.3 ml/min (by C-G formula based on Cr of 0.75).   Medications:  Heparin at 2250units/hr  Assessment: 67 YOF who underwent knee replacement on 9/29 and was now found to have a PE and started on heparin + warfarin. Warfarin and heparin bridging day #3. Heparin rate was increased to 2250units/hr on 10/3 to ensure level remained therapeutic (previous level 0.3). Heparin level on 10/4 was therapeutic at 0.44. INR still subtherapeutic but increased significantly from 1.27 to 1.91.   Goals: INR 2-3 Heparin level 0.3-0.7 units/ml Monitor platelets by anticoagulation protocol: Yes   Plan:  - Continue heparin at 2250units/hr - Heparin level with AM  labs - Warfarin 5mg  po x 1 tonight - Daily HL, CBC and INR - Follow up possible transition to Lovenox, any signs of bleeding/clotting and clinical progression  Christiane Ha A. Lenon Ahmadi, PharmD Clinical Pharmacist - Resident Pager: [phone] Pharmacy: [phone] 09/20/2013 8:23 AM

## 2013-09-20 NOTE — Significant Event (Signed)
Rapid Response Event Note  Overview: called for patient with CP  Time Called: 1520 Arrival Time: 1522 Event Type: Cardiac  Initial Focused Assessment:  Upon arrival to patients Rn at bedside.  Patient c/o epigastic pain with a rating of 4/10 currently, does not radiate and worse with inspiration.  Pain is starting to ease off.  Currently she is lying  In bed, with NAD noted, skin warm and dry   Interventions:  EKG done prior to my arrival, results reviewed and spoke with Dr. Frederico Hamman.  Orders for labs and morphine given.     Event Summary:  RN to call if assistance needed   at      at          [facility], Maryagnes Amos

## 2013-09-20 NOTE — Progress Notes (Signed)
Subjective: 5 Days Post-Op Procedure(s) (LRB): TOTAL KNEE ARTHROPLASTY (Left) Bilateral PE on anticoagulation Patient reports pain as mild.    Objective: Vital signs in last 24 hours: Temp:  [98.5 F (36.9 C)-99.6 F (37.6 C)] 98.5 F (36.9 C) (10/04 0651) Pulse Rate:  [96-111] 96 (10/04 0651) Resp:  [16-18] 18 (10/04 0651) BP: (108-139)/(57-71) 108/57 mmHg (10/04 0651) SpO2:  [92 %-97 %] 95 % (10/04 0651)  Intake/Output from previous day: 10/03 0701 - 10/04 0700 In: 480 [P.O.:240; I.V.:240] Out: -  Intake/Output this shift:     Recent Labs  09/18/13 0318 09/19/13 0445 09/20/13 0510  HGB 9.8* 9.5* 9.9*    Recent Labs  09/19/13 0445 09/20/13 0510  WBC 8.2 7.2  RBC 3.51* 3.58*  HCT 29.3* 29.8*  PLT 255 259    Recent Labs  09/19/13 0445 09/20/13 0510  NA 136 141  K 4.9 4.3  CL 98 100  CO2 33* 32  BUN 10 12  CREATININE 0.77 0.75  GLUCOSE 125* 128*  CALCIUM 8.2* 8.6    Recent Labs  09/19/13 0445 09/20/13 0510  INR 1.27 1.91*    Neurovascular intact Sensation intact distally Intact pulses distally Dorsiflexion/Plantar flexion intact Incision: dressing C/D/I and scant drainage No cellulitis present Compartment soft  Assessment/Plan: 5 Days Post-Op Procedure(s) (LRB): TOTAL KNEE ARTHROPLASTY (Left) Up with therapy WBAT LLE with walker Will continue to monitor Anticipate possible d/c Fortino Sic 09/20/2013, 9:31 AM

## 2013-09-20 NOTE — Progress Notes (Signed)
Pt c/o increased shortness of breath, tightness in chest around 2245. Inspiratory wheezing in BLL, strong, congested productive cough with thick grey sputum. Sats on 2.5L O2 ranged between low 80's to low 90's. Increased O2 to 3L, sats then ranged 88-98%. Raised HOB, instructed pt to cough and deep breathe. Pt did not appear to be in any obvious distress however she stated her "lungs still felt tight and could not get a deep breathe". MD on called notified. Albuterol prn, sputum culture and CXR ordered and completed. Pt received Albuterol at 0008 and stated this helped greatly. Productive congestive cough continues but pt able to rest and sleep. Sats currently remaining between 90-98% on 3L O2. Will attempt to decrease O2 again in the AM as pt condition allows. Will continue to monitor.

## 2013-09-20 NOTE — Progress Notes (Signed)
At 1450, I was told pt needed help to the bathroom.  When I came into room pt said she was having "pain in her chest".  Vitals were taken, bp 146/76, heart rate 103.  EKG completed, results showed stemi, Pulmonary on call & Dr. Frederico Hamman notified.  Dr. Frederico Hamman reviewed results and did not feel pt had stemi.  Orders received, nsg to continue to monitor.

## 2013-09-20 NOTE — Progress Notes (Deleted)
   CARE MANAGEMENT NOTE 09/20/2013  Patient:  [name], [name]   Account Number:  [id number]  Date Initiated:  09/20/2013  Documentation initiated by:  [facility]  Subjective/Objective Assessment:   WGN:FAOZH KNEE ARTHROPLASTY (Left)     Action/Plan:   discharge planning   Anticipated DC Date:  09/22/2013   Anticipated DC Plan:  HOME W HOME HEALTH SERVICES      DC Planning Services  CM consult      [facility] Choice  HOME HEALTH   Choice offered to / List presented to:     DME arranged  CPM      DME agency  [employer]     HH arranged  HH-2 PT      [facility] agency  [facility]   Status of service:  In process, will continue to follow Medicare Important Message given?   (If response is "NO", the following Medicare IM given date fields will be blank) Date Medicare IM given:   Date Additional Medicare IM given:    Discharge Disposition:    Per UR Regulation:    If discussed at Long Length of Stay Meetings, dates discussed:    Comments:  09/20/13 15:10 Home health arranged in MD office prior to surgery.  Genevieve Norlander will provide HHPT.  DME pre-arranged in MD office through [employer].  CM will continue to monitor for discharge needs.  Freddy Jaksch, BSN, CM [phone].

## 2013-09-21 LAB — GLUCOSE, CAPILLARY
Glucose-Capillary: 147 mg/dL — ABNORMAL HIGH (ref 70–99)
Glucose-Capillary: 107 mg/dL — ABNORMAL HIGH (ref 70–99)

## 2013-09-21 LAB — CBC
Hemoglobin: 10.6 g/dL — ABNORMAL LOW (ref 12.0–15.0)
MCH: 27.5 pg (ref 26.0–34.0)
MCHC: 33 g/dL (ref 30.0–36.0)
MCV: 83.2 fL (ref 78.0–100.0)
Platelets: 308 10*3/uL (ref 150–400)
RBC: 3.86 MIL/uL — ABNORMAL LOW (ref 3.87–5.11)

## 2013-09-21 LAB — BASIC METABOLIC PANEL
Calcium: 8.9 mg/dL (ref 8.4–10.5)
Chloride: 96 mEq/L (ref 96–112)
GFR calc Af Amer: >90 mL/min (ref >90)
GFR calc non Af Amer: >90 mL/min (ref >90)
Glucose, Bld: 129 mg/dL — ABNORMAL HIGH (ref 70–99)
Potassium: 4.5 mEq/L (ref 3.5–5.1)
Sodium: 134 mEq/L — ABNORMAL LOW (ref 135–145)

## 2013-09-21 LAB — PROTIME-INR
INR: 2.62 — ABNORMAL HIGH (ref 0.00–1.49)
Prothrombin Time: 27.1 seconds — ABNORMAL HIGH (ref 11.6–15.2)

## 2013-09-21 MED ORDER — WARFARIN SODIUM 2.5 MG PO TABS
2.5000 mg | ORAL_TABLET | Freq: Once | ORAL | Status: AC
  Administered 2013-09-21: 2.5 mg via ORAL
  Filled 2013-09-21: qty 1

## 2013-09-21 NOTE — Progress Notes (Signed)
Filed Vitals:   09/20/13 1653 09/20/13 2000 09/20/13 2134 09/21/13 0640  BP:   134/69 116/64  Pulse:   102 90  Temp:   99.7 F (37.6 C) 98.8 F (37.1 C)  TempSrc:      Resp: 18 18 18 18   Height:      Weight:      SpO2: 96% 98% 98% 99%    Intake/Output Summary (Last 24 hours) at 09/21/13 1035 Last data filed at 09/21/13 1610  Gross per 24 hour  Intake 1116.83 ml  Output      0 ml  Net 1116.83 ml    SUBJECTIVE Feels well. No further chest pain.  LABS: Basic Metabolic Panel:  Recent Labs  96/04/54 1833 09/21/13 0715  NA 137 134*  K 4.7 4.5  CL 97 96  CO2 29 26  GLUCOSE 168* 129*  BUN 11 12  CREATININE 0.66 0.64  CALCIUM 8.9 8.9   Liver Function Tests:  Recent Labs  09/20/13 1833  AST 24  ALT 15  ALKPHOS 81  BILITOT 0.4  PROT 7.2  ALBUMIN 2.4*   No results found for this basename: LIPASE, AMYLASE,  in the last 72 hours CBC:  Recent Labs  09/20/13 1833 09/21/13 0715  WBC 10.6* 9.2  HGB 10.8* 10.6*  HCT 31.8* 32.1*  MCV 82.4 83.2  PLT 321 308   Cardiac Enzymes:  Recent Labs  09/20/13 1833 09/20/13 2245 09/21/13 0715  TROPONINI <0.30 <0.30 <0.30    Radiology/Studies:  Ct Angio Chest Pe W/cm &/or Wo Cm  09/17/2013   CLINICAL DATA:  Hypoxia  EXAM: CT ANGIOGRAPHY CHEST WITH CONTRAST  TECHNIQUE: Multidetector CT imaging of the chest was performed using the standard protocol during bolus administration of intravenous contrast. Multiplanar CT image reconstructions including MIPs were obtained to evaluate the vascular anatomy.  CONTRAST:  100 mL Omnipaque 350 nonionic  COMPARISON:  Chest radiograph January 21, 2013 and CT angiogram chest February 12, 2009  FINDINGS: There is extensive pulmonary embolus the arising from the distal right main pulmonary artery and extending in to proximal right lower and lower lobe branches. On the left, there are multiple the left lower lobe segmental pulmonary emboli as well as posterior segment right upper  lobe segmental pulmonary artery branch.  There is no thoracic aortic aneurysm or dissection.  There is patchy bibasilar consolidation. There is patchy atelectasis in the lower lobes as well. There is a minimal effusion on the left.  There is no appreciable thoracic adenopathy. Pericardium is not thickened.  In the visualized upper abdomen, there is fatty change in the liver. There are no blastic or lytic bone lesions. Thyroid appears unremarkable.  Review of the MIP images confirms the above findings.  IMPRESSION: Extensive pulmonary embolus bilaterally with pulmonary embolus on the right arising in the distal right main pulmonary artery. There are pulmonary emboli in bilateral upper and lower lobe branches the.  There is patchy infiltrate in both lower lobes. Small left effusion.  Fatty liver.  Critical Value/emergent results were called by telephone at the time of interpretation on 09/17/2013 at 3:06 PM to [facility] , who verbally acknowledged these results.   Electronically Signed   By: Bretta Bang   On: 09/17/2013 15:07   Dg Chest Port 1 View  09/20/2013   CLINICAL DATA:  Respiratory failure  EXAM: PORTABLE CHEST - 1 VIEW  COMPARISON:  01/21/2013  FINDINGS: Low volume lungs with an indistinct retrocardiac opacity. Inspiration likely  accounts for apparent cardiac enlargement. No effusion, pneumothorax, or edema.  IMPRESSION: 1. Retrocardiac opacity which is likely either atelectasis or infarct/reperfusion edema. 2. Low lung volumes.   Electronically Signed   By: Tiburcio Pea M.D.   On: 09/20/2013 00:07   Ecg: NSR, old inferior MI, no acute change.  PHYSICAL EXAM General: Well developed, well nourished, in no acute distress. Neck: Negative for carotid bruits. JVD not elevated. Lungs: Clear bilaterally to auscultation without wheezes, rales, or rhonchi. Breathing is unlabored. Heart: RRR S1 S2 without murmurs, rubs, or gallops.  Abdomen: Soft, non-tender, non-distended with normoactive bowel  sounds.  Extremities: No clubbing, cyanosis or edema.  Distal pedal pulses are 2+ and equal bilaterally. Neuro: Alert and oriented X 3. Moves all extremities spontaneously.   ASSESSMENT AND PLAN: 1. Chest pain. Cardiac enzymes and Ecg are normal. Continue medical management. 2. PE. Therapeutic on coumadin. 3. S/p TKR.  No further recs from our standpoint. Will sign off.  Active Problems:   Chest pain at rest   Acute pulmonary embolism   Hypoxemia   Pulmonary embolism    Signed, Oviya Ammar [place] MD,FACC 09/21/2013 10:39 AM

## 2013-09-21 NOTE — Progress Notes (Signed)
Orthopedic Tech Progress Note Patient Details:  HILDRETH ORSAK 1946/10/24 [id number] Put on cpm at 8:00 pm LLE 0-90 Patient ID: POONAM WOEHRLE, female   DOB: Oct 12, 1946, 67 y.o.   MRN: [id number]   Jennye Moccasin 09/21/2013, 8:15 PM

## 2013-09-21 NOTE — Progress Notes (Signed)
Subjective: 6 Days Post-Op Procedure(s) (LRB): TOTAL KNEE ARTHROPLASTY (Left) Patient reports pain as mild.  She had c/o CP last evening worked up with labs and ekg, consult by cardiology, deemed stable.  Objective: Vital signs in last 24 hours: Temp:  [98.8 F (37.1 C)-99.7 F (37.6 C)] 98.8 F (37.1 C) (10/05 0640) Pulse Rate:  [90-103] 90 (10/05 0640) Resp:  [18] 18 (10/05 0640) BP: (116-146)/(61-76) 116/64 mmHg (10/05 0640) SpO2:  [96 %-99 %] 99 % (10/05 0640)  Intake/Output from previous day: 10/04 0701 - 10/05 0700 In: 1116.8 [P.O.:720; I.V.:396.8] Out: -  Intake/Output this shift:     Recent Labs  09/19/13 0445 09/20/13 0510 09/20/13 1833 09/21/13 0715  HGB 9.5* 9.9* 10.8* 10.6*    Recent Labs  09/20/13 1833 09/21/13 0715  WBC 10.6* 9.2  RBC 3.86* 3.86*  HCT 31.8* 32.1*  PLT 321 308    Recent Labs  09/20/13 1833 09/21/13 0715  NA 137 134*  K 4.7 4.5  CL 97 96  CO2 29 26  BUN 11 12  CREATININE 0.66 0.64  GLUCOSE 168* 129*  CALCIUM 8.9 8.9    Recent Labs  09/20/13 0510 09/21/13 0715  INR 1.91* 2.62*    Neurovascular intact Sensation intact distally Intact pulses distally Dorsiflexion/Plantar flexion intact Incision: dressing C/D/I Currently in CPM to 90 degrees  Assessment/Plan: 6 Days Post-Op Procedure(s) (LRB): TOTAL KNEE ARTHROPLASTY (Left) Up with therapy Plan for discharge tomorrow Discharge home with home health    Margart Sickles 09/21/2013, 9:38 AM

## 2013-09-21 NOTE — Progress Notes (Signed)
ANTICOAGULATION CONSULT NOTE - Follow Up Consult  Pharmacy Consult for heparin and warfarin Indication: pulmonary embolus  Allergies  Allergen Reactions  . Amaryl Nausea Only  . Codeine Nausea And Vomiting  . Crestor [Rosuvastatin Calcium]   . Lipitor [Atorvastatin Calcium]     MYALGIAS  . Lovastatin     MYALGIAS   . Niaspan [Niacin Er] Nausea Only  . Sumycin [Tetracycline Hcl]   . Zetia [Ezetimibe]   . Zithromax [Azithromycin]   . Zocor [Simvastatin - High Dose]     MYALGIAS  . Zocor [Simvastatin]     Muscle pain    Patient Measurements: Height: 5\' 2"  (157.5 cm) Weight: 199 lb (90.266 kg) IBW/kg (Calculated) : 50.1 Heparin Dosing Weight: 73kg  Vital Signs: Temp: 98.8 F (37.1 C) (10/05 0640) BP: 116/64 mmHg (10/05 0640) Pulse Rate: 90 (10/05 0640)  Labs:  Recent Labs  09/19/13 0445  09/19/13 2018 09/20/13 0510 09/20/13 1833 09/20/13 2245 09/21/13 0715  HGB 9.5*  --   --  9.9* 10.8*  --  10.6*  HCT 29.3*  --   --  29.8* 31.8*  --  32.1*  PLT 255  --   --  259 321  --  308  LABPROT 15.6*  --   --  21.3*  --   --  27.1*  INR 1.27  --   --  1.91*  --   --  2.62*  HEPARINUNFRC 0.35  < > 0.30 0.44  --   --  0.35  CREATININE 0.77  --   --  0.75 0.66  --  0.64  TROPONINI  --   --   --   --  <0.30 <0.30 <0.30  < > = values in this interval not displayed.  Estimated Creatinine Clearance: 72.3 ml/min (by C-G formula based on Cr of 0.64).   Medications:  Heparin at 2250units/hr  Assessment: 41 YOF who underwent knee replacement on 9/29 and was now found to have a PE and started on heparin + warfarin. Warfarin and heparin bridging day #4. Heparin rate was increased to 2250units/hr on 10/3 to ensure level remained therapeutic (previous level 0.3). Heparin level on 10/4 was therapeutic at 0.44 so same rate was continued. Heparin level on 10/5 decreased but it is still therapeutic at 0.35. Nurse reported heparin was not turned off over night. INR is therapeutic but  increased significantly from 1.91 to 2.62. H/H is low but stable and platelets are normal. No S/S of bleeding.   Goals: INR 2-3 Heparin level 0.3-0.7 units/ml Monitor platelets by anticoagulation protocol: Yes   Plan:  - Continue heparin at 2250units/hr - Obtain HL with AM labs - Warfarin 2.5mg  PO x 1 tonight - Plan to d/c on 10/6 - Daily HL, CBC and INR  Moira Umholtz A. Lenon Ahmadi, PharmD Clinical Pharmacist - Resident Pager: [phone] Pharmacy: [phone] 09/21/2013 10:10 AM

## 2013-09-21 NOTE — Progress Notes (Signed)
Physical Therapy Treatment Patient Details Name: [name] MRN: [id number] DOB: March 24, 1946 Today's Date: 09/21/2013 Time: 3086-5784 PT Time Calculation (min): 28 min  PT Assessment / Plan / Recommendation  History of Present Illness [name]    PT Comments   Pt making steady progress with mobility today with increased gait distance and improved ROM. SaO2 levels maintained >90% with gait today as well.   Follow Up Recommendations  Home health PT;Supervision/Assistance - 24 hour     Equipment Recommendations  None recommended by PT       Frequency 7X/week   Progress towards PT Goals Progress towards PT goals: Progressing toward goals  Plan Current plan remains appropriate    Precautions / Restrictions Precautions Precaution Comments: monitor SaO2 levels with mobility Required Braces or Orthoses: Other Brace/Splint Other Brace/Splint: footsie roll under left foot after session when pt back in bed Restrictions Weight Bearing Restrictions: Yes LLE Weight Bearing: Weight bearing as tolerated       Mobility  Bed Mobility Sit to Supine: 4: Min assist;HOB flat Details for Bed Mobility Assistance: assist needed to elevate and clear bed surface with left leg while lying down Transfers Sit to Stand: 6: Modified independent (Device/Increase time);From toilet;From elevated surface;With upper extremity assist;With armrests;From chair/3-in-1 Stand to Sit: 6: Modified independent (Device/Increase time);To bed;To toilet;With upper extremity assist;With armrests;To elevated surface Details for Transfer Assistance: cues for hand placement with transfers Ambulation/Gait Ambulation/Gait Assistance: 4: Min guard;5: Supervision Ambulation Distance (Feet): 190 Feet Assistive device: Rolling walker Gait Pattern: Step-through pattern;Decreased stride length;Antalgic General Gait Details: SaO2 >90% on RA with gait this session    Exercises Total Joint Exercises Ankle  Circles/Pumps: AROM;Strengthening;Both;10 reps;Supine Quad Sets: AROM;Strengthening;Left;10 reps;Supine Heel Slides: AAROM;Strengthening;Left;10 reps;Supine Hip ABduction/ADduction: AAROM;Strengthening;Left;10 reps;Supine Straight Leg Raises: AAROM;Strengthening;Left;10 reps;Supine Long Arc Quad: AAROM;Strengthening;Left;10 reps;Supine Knee Flexion: AROM;AAROM;Left;Limitations Knee Flexion Limitations: for ROM measurements only x1 rep Goniometric ROM: left knee ROM: AROM 70 degrees flexion, AAROM 75 degrees flexion in seated position without compensation     PT Goals (current goals can now be found in the care plan section) Acute Rehab PT Goals Patient Stated Goal: to go home with husband when im ready PT Goal Formulation: With patient/family Time For Goal Achievement: 09/22/13 Potential to Achieve Goals: Good  Visit Information  Last PT Received On: 09/21/13 Assistance Needed: +1 History of Present Illness: [name]     Subjective Data  Patient Stated Goal: to go home with husband when im ready   Cognition  Cognition Arousal/Alertness: Awake/alert Behavior During Therapy: WFL for tasks assessed/performed Overall Cognitive Status: Within Functional Limits for tasks assessed       End of Session PT - End of Session Equipment Utilized During Treatment: Gait belt;Oxygen (oxygen on except with gait at 1.5 lpm via [place]) Activity Tolerance: Patient tolerated treatment well Patient left: in bed;with family/visitor present;with call bell/phone within reach Nurse Communication: Mobility status CPM Left Knee CPM Left Knee: On Left Knee Flexion (Degrees): 90 Left Knee Extension (Degrees): 0   GP     Sallyanne Kuster 09/21/2013, 2:17 PM

## 2013-09-22 ENCOUNTER — Telehealth: Payer: Self-pay | Admitting: Internal Medicine

## 2013-09-22 DIAGNOSIS — I2699 Other pulmonary embolism without acute cor pulmonale: Secondary | ICD-10-CM

## 2013-09-22 LAB — GLUCOSE, CAPILLARY
Glucose-Capillary: 95 mg/dL (ref 70–99)
Glucose-Capillary: 86 mg/dL (ref 70–99)

## 2013-09-22 LAB — BASIC METABOLIC PANEL
BUN: 13 mg/dL (ref 6–23)
CO2: 30 mEq/L (ref 19–32)
Calcium: 8.8 mg/dL (ref 8.4–10.5)
Chloride: 98 mEq/L (ref 96–112)
GFR calc Af Amer: >90 mL/min (ref >90)
Sodium: 138 mEq/L (ref 135–145)

## 2013-09-22 LAB — CBC
HCT: 30.2 % — ABNORMAL LOW (ref 36.0–46.0)
MCV: 82.1 fL (ref 78.0–100.0)
RBC: 3.68 MIL/uL — ABNORMAL LOW (ref 3.87–5.11)
WBC: 8.1 10*3/uL (ref 4.0–10.5)

## 2013-09-22 LAB — PROTIME-INR: Prothrombin Time: 27.5 seconds — ABNORMAL HIGH (ref 11.6–15.2)

## 2013-09-22 LAB — HEPARIN LEVEL (UNFRACTIONATED): Heparin Unfractionated: 0.28 IU/mL — ABNORMAL LOW (ref 0.30–0.70)

## 2013-09-22 MED ORDER — WARFARIN SODIUM 5 MG PO TABS: 5.0000 mg | ORAL_TABLET | ORAL | Status: DC

## 2013-09-22 MED ORDER — WARFARIN SODIUM 7.5 MG PO TABS: 7.5000 mg | ORAL_TABLET | ORAL | Status: DC

## 2013-09-22 MED ORDER — WARFARIN SODIUM 5 MG PO TABS
7.5000 mg | ORAL_TABLET | ORAL | Status: DC
  Filled 2013-09-22: qty 1.5

## 2013-09-22 MED ORDER — WARFARIN SODIUM 5 MG PO TABS
7.5000 mg | ORAL_TABLET | ORAL | Status: DC
  Administered 2013-09-22: 7.5 mg via ORAL
  Filled 2013-09-22: qty 1.5

## 2013-09-22 NOTE — Progress Notes (Signed)
Patient denies shortness of breath or chest pain at time of discharge. Pain medication given prior to patient being discharged

## 2013-09-22 NOTE — Progress Notes (Signed)
Patient resting comfortably in recliner, family and chaplain at bedside.  Pulmonary MD discontinued heparin drip, wanted patient to stay in hospital for two more hours for safety.  Patient and family agreeable.

## 2013-09-22 NOTE — Progress Notes (Signed)
ANTICOAGULATION CONSULT NOTE - Follow Up Consult  Pharmacy Consult for heparin and warfarin Indication: pulmonary embolus  Allergies  Allergen Reactions  . Amaryl Nausea Only  . Codeine Nausea And Vomiting  . Crestor [Rosuvastatin Calcium]   . Lipitor [Atorvastatin Calcium]     MYALGIAS  . Lovastatin     MYALGIAS   . Niaspan [Niacin Er] Nausea Only  . Sumycin [Tetracycline Hcl]   . Zetia [Ezetimibe]   . Zithromax [Azithromycin]   . Zocor [Simvastatin - High Dose]     MYALGIAS  . Zocor [Simvastatin]     Muscle pain    Patient Measurements: Height: 5\' 2"  (157.5 cm) Weight: 199 lb (90.266 kg) IBW/kg (Calculated) : 50.1 Heparin Dosing Weight: 73kg  Vital Signs: Temp: 99.2 F (37.3 C) (10/06 0711) BP: 109/61 mmHg (10/06 0711) Pulse Rate: 99 (10/06 1029)  Labs:  Recent Labs  09/20/13 0510 09/20/13 1833 09/20/13 2245 09/21/13 0715 09/22/13 0615  HGB 9.9* 10.8*  --  10.6* 10.0*  HCT 29.8* 31.8*  --  32.1* 30.2*  PLT 259 321  --  308 323  LABPROT 21.3*  --   --  27.1* 27.5*  INR 1.91*  --   --  2.62* 2.67*  HEPARINUNFRC 0.44  --   --  0.35 0.28*  CREATININE 0.75 0.66  --  0.64 0.71  TROPONINI  --  <0.30 <0.30 <0.30  --     Estimated Creatinine Clearance: 72.3 ml/min (by C-G formula based on Cr of 0.71).   Medications:  Heparin at 2250units/hr  Assessment: [name] who underwent knee replacement on 9/29 and was now found to have a B PE and started on heparin + warfarin. Warfarin and heparin bridging day #5. HL is slightly low at 0.28 on 2250 units/hr. Heparin is infusing well without problems per RN report.   INR is therapeutic at 2.67 after 4 doses of coumadin.  CBC stable. No S/S of bleeding.   Goals: INR 2-3 Heparin level 0.3-0.7 units/ml Monitor platelets by anticoagulation protocol: Yes   Plan:  - increase heparin to 2350 units/hr now, -- Warfarin 7.5 mg MWF and 5 mg TTSS -- would continue heparin drip until 5th dose of coumadin is given today, could  give early if desired to facilatate discharge home -rec DC prescription for coumadin 5 mg tablets: take 1.5 tablets = 7.5 mg MWF and 1 tablet = 5 mg TTSS and then take as directed by home health agency to keep INR 2-3 Herby Abraham, Pharm.D. [phone] 09/22/2013 11:07 AM

## 2013-09-22 NOTE — Telephone Encounter (Signed)
[email or web address] [name] ° °

## 2013-09-22 NOTE — Progress Notes (Signed)
PULMONARY  / CRITICAL CARE MEDICINE  Name: [name] MRN: [id number] DOB: 1946-11-28    ADMISSION DATE:  09/15/2013 CONSULTATION DATE:  09/17/2013  REFERRING MD :  Dr. Sherlean Foot PRIMARY SERVICE:  Orthopedics  CHIEF COMPLAINT:  Hypoxemia  BRIEF PATIENT DESCRIPTION: 67 year old with a cardiac history, HTN and diabetes presenting to the hospital for left knee replacement.  On 10/1 patient was found to be tachycardic and hypoxic.  CTA was performed that showed a pulmonary embolism and PCCM was called on consultation.  Patient denies previous blood clots or family history of blood clots.  Has not been on blood thinners in the past.  No hemoptysis or chest pain.  SIGNIFICANT EVENTS / STUDIES:  10/1 >>> CTA with PE. 09/18/13 - duplex LE - negative DVT 09/18/13 - ECHO - normal RV  Pulmonary arteries: Systolic pressure was mildly to moderately increased. PA peak pressure: 47mm Hg (S  LINES / TUBES: PIV   CULTURES: None  ANTIBIOTICS: None    SUBJECTIVE:   Pulse ox 88% RA at rest and needs IS to bring it up. Needs oxygen.  INR > 2 x 24h. On heparin x 5 days per ortho    Recent Labs Lab 09/19/13 0445 09/20/13 0510 09/21/13 0715 09/22/13 0615  INR 1.27 1.91* 2.62* 2.67*     VITAL SIGNS: Temp:  [98.5 F (36.9 C)-99.2 F (37.3 C)] 99.2 F (37.3 C) (10/06 0711) Pulse Rate:  [89-102] 99 (10/06 1029) Resp:  [9-18] 14 (10/06 1200) BP: (109-136)/(61-83) 118/74 mmHg (10/06 1127) SpO2:  [88 %-95 %] 88 % (10/06 1349)  PHYSICAL EXAMINATION: General:  Well appearing, mild pain in the left knee but stable. Deconditioned Neuro:  Alert and interactive, moves all ext to command. HEENT:  No jvd Neck:  Supple, -LAN  Cardiovascular:  RRR Lungs:  CTA bilaterally. Abdomen:  Soft, NT, ND and +BS. Musculoskeletal:  -edema and -tenderness.left knee dressing dry and intact Skin:  Intact, unable to evaluate wound due to bandages.   PULMONARY No results found for this basename: PHART,  PCO2, PCO2ART, PO2, PO2ART, HCO3, TCO2, O2SAT,  in the last 168 hours  CBC  Recent Labs Lab 09/20/13 1833 09/21/13 0715 09/22/13 0615  HGB 10.8* 10.6* 10.0*  HCT 31.8* 32.1* 30.2*  WBC 10.6* 9.2 8.1  PLT 321 308 323    COAGULATION  Recent Labs Lab 09/19/13 0445 09/20/13 0510 09/21/13 0715 09/22/13 0615  INR 1.27 1.91* 2.62* 2.67*    CARDIAC   Recent Labs Lab 09/20/13 1833 09/20/13 2245 09/21/13 0715  TROPONINI <0.30 <0.30 <0.30   No results found for this basename: PROBNP,  in the last 168 hours   CHEMISTRY  Recent Labs Lab 09/19/13 0445 09/20/13 0510 09/20/13 1833 09/21/13 0715 09/22/13 0615  NA 136 141 137 134* 138  K 4.9 4.3 4.7 4.5 4.1  CL 98 100 97 96 98  CO2 33* 32 29 26 30   GLUCOSE 125* 128* 168* 129* 126*  BUN 10 12 11 12 13   CREATININE 0.77 0.75 0.66 0.64 0.71  CALCIUM 8.2* 8.6 8.9 8.9 8.8   Estimated Creatinine Clearance: 72.3 ml/min (by C-G formula based on Cr of 0.71).   LIVER  Recent Labs Lab 09/19/13 0445 09/20/13 0510 09/20/13 1833 09/21/13 0715 09/22/13 0615  AST  --   --  24  --   --   ALT  --   --  15  --   --   ALKPHOS  --   --  81  --   --  BILITOT  --   --  0.4  --   --   PROT  --   --  7.2  --   --   ALBUMIN  --   --  2.4*  --   --   INR 1.27 1.91*  --  2.62* 2.67*     INFECTIOUS No results found for this basename: LATICACIDVEN, PROCALCITON,  in the last 168 hours   ENDOCRINE CBG (last 3)   Recent Labs  09/21/13 2121 09/22/13 0652 09/22/13 1137  GLUCAP 95 115* 126*         IMAGING x48h  No results found.    ASSESSMENT / PLAN:  67 year old female with no previous blood clot history, currently on lovenox DVT prophylaxis who developed a provoked PE.  Patient was on ticagrelor prior to surgery for stent placement.  Communicated with ortho, ok to start anti-coagulant.   This is a first PE that is provoked by surgery.  09/22/13: improved and stable to go home. Meets criteria 5d IV heparin + INR  > 2 x 24h.No bleed. Looks well.  Will be off brilinta. However, pulse ox 88% at Rest on ROom Air. So need 2L oxygen   Plan: -DC heparin infusion; home 2 after dc (dc;ed at 13.50 approximately) -Coumadin clinic; will have my office arrage - FU with me Dr Marchelle Gearing 10/01/13 at 3.30pm [address], [place], [address], [address]nd floor' --After provoked (post-ortho procedure) VTE, usual recommended duration is 3-6 months with 6 mos preferred if no problems with anticoagulation -There is no reason to suspect a hypercoag state but apparently a panel has been sent already so will follow up on this - START 2L o2 will monitor this at followup; likely will resolve need in few to seeral week/months-   Dr. Kalman Shan, M.D., [facility]C.P Pulmonary and Critical Care Medicine Staff Physician [facility] [place] [facility]monary and Critical Care Pager: [phone], If no answer or between  15:00h - 7:00h: call [phone]  09/22/2013 2:02 PM

## 2013-09-22 NOTE — Telephone Encounter (Signed)
I have placed referral so will send to PCC's.

## 2013-09-22 NOTE — Telephone Encounter (Signed)
Triage  I  have given this patient hospital fu to see me 10/01/13. Howeer, Juantia at fron desk when I called could not help me get to coumadin clinic on [place]. She got me to [facility] medicine who transferred me to [facility] at [place]. So, please give this patient coumadin clinic appt at [place] in 1-3 days   Thanks  Dr. Kalman Shan, M.D., [facility].C.P Pulmonary and Critical Care Medicine Staff Physician [place] [facility] [place] Pulmonary and Critical Care Pager: [phone], If no answer or between  15:00h - 7:00h: call [phone]  09/22/2013 2:04 PM

## 2013-09-22 NOTE — Progress Notes (Signed)
Discharge education about pulmonary emboli provided to the patient.

## 2013-09-22 NOTE — Progress Notes (Signed)
Physical Therapy Treatment Patient Details Name: [name] MRN: [id number] DOB: 02-04-1946 Today's Date: 09/22/2013 Time: 4098-1191 PT Time Calculation (min): 24 min  PT Assessment / Plan / Recommendation  History of Present Illness [name]    PT Comments   Pt cont's to progress with mobility.  Increased ambulation distance.  02 sats >90% RA upon arrival.  Remained 88-92% RA with ambulation.  92% RA at end of session.     Follow Up Recommendations  Home health PT;Supervision/Assistance - 24 hour     Does the patient have the potential to tolerate intense rehabilitation     Barriers to Discharge        Equipment Recommendations  None recommended by PT    Recommendations for Other Services    Frequency 7X/week   Progress towards PT Goals Progress towards PT goals: Progressing toward goals  Plan Current plan remains appropriate    Precautions / Restrictions Precautions Precautions: Fall;Knee;Other (comment) Precaution Comments: monitor SaO2 levels with mobility Required Braces or Orthoses: Other Brace/Splint Other Brace/Splint: footsie roll under left foot after session when pt back in bed Restrictions LLE Weight Bearing: Weight bearing as tolerated       Mobility  Bed Mobility Bed Mobility: Sit to Supine Sit to Supine: 6: Modified independent (Device/Increase time);HOB flat Details for Bed Mobility Assistance: Pt able to lift LLE into bed by herself Transfers Transfers: Sit to Stand;Stand to Sit Sit to Stand: 6: Modified independent (Device/Increase time);With upper extremity assist;With armrests;From chair/3-in-1 Stand to Sit: 6: Modified independent (Device/Increase time);With upper extremity assist;With armrests;To chair/3-in-1 Ambulation/Gait Ambulation/Gait Assistance: 5: Supervision Ambulation Distance (Feet): 220 Feet Assistive device: Rolling walker Ambulation/Gait Assistance Details: Pt progressing with fluidity of gait pattern Gait  Pattern: Step-through pattern;Decreased stride length General Gait Details: Sa02 88-92% RA entire distance Stairs: No Wheelchair Mobility Wheelchair Mobility: No    Exercises Total Joint Exercises Ankle Circles/Pumps: AROM;Both;15 reps Quad Sets: AROM;Both;10 reps Straight Leg Raises: AROM;Strengthening;Left (12 reps) Long Arc Quad: AROM;Strengthening;Left (12 reps) Knee Flexion: AROM;Left;Seated (12 reps; sitting EOB) Goniometric ROM: Lt knee flexion 95 degrees    PT Goals (current goals can now be found in the care plan section) Acute Rehab PT Goals PT Goal Formulation: With patient/family Time For Goal Achievement: 09/22/13 Potential to Achieve Goals: Good  Visit Information  Last PT Received On: 09/22/13 Assistance Needed: +1 History of Present Illness: [name]     Subjective Data      Cognition  Cognition Arousal/Alertness: Awake/alert Behavior During Therapy: WFL for tasks assessed/performed Overall Cognitive Status: Within Functional Limits for tasks assessed    Balance     End of Session PT - End of Session Activity Tolerance: Patient tolerated treatment well Patient left: in bed;in CPM;with family/visitor present Nurse Communication: Mobility status   GP     Lara Mulch 09/22/2013, 8:30 AM  Verdell Face, PTA [phone] 09/22/2013

## 2013-09-22 NOTE — Progress Notes (Signed)
09/22/13 Home O2 ordered for patient, contacted Jill Alexanders at [facility]. Home O2 not covered for patient's dx, cost would be approx $300. Contacted Dr. Marchelle Gearing, he stated that patient is OK to go home without home O2. He asked that [facility] call PT/INR to [facility] [phone]. Contacted Kaijah Abts at [facility] informed her of MD's request and that patient not going home on O2. Spoke with patient and informed her that she was not going to have home O2, encouraged use of incentive spirometer.Jacquelynn Cree RN, BSN, CCM      09/22/13 Contacted Brigette Hopfer at [place] and set up [facility] for PT/INR checks. Jacquelynn Cree RN, BSN, CCM  09/16/13 Patient set up with Genevieve Norlander [facility] for HHPT by MD office. [employer] to provide CPM, rolling walker, and 3N1. Jacquelynn Cree RN, BSN, CCM

## 2013-09-23 ENCOUNTER — Ambulatory Visit: Payer: Medicare Other | Admitting: Cardiology

## 2013-09-23 ENCOUNTER — Other Ambulatory Visit: Payer: Medicare Other

## 2013-09-23 LAB — CULTURE, RESPIRATORY: Culture: NORMAL

## 2013-09-23 LAB — CULTURE, RESPIRATORY W GRAM STAIN

## 2013-09-23 NOTE — Telephone Encounter (Signed)
Spoke to [name] in prim care pt will be called to set up coumadin appt Tobe Sos

## 2013-09-24 ENCOUNTER — Ambulatory Visit: Payer: Medicare Other | Admitting: Cardiology

## 2013-09-24 ENCOUNTER — Other Ambulatory Visit: Payer: Medicare Other

## 2013-09-24 ENCOUNTER — Ambulatory Visit (INDEPENDENT_AMBULATORY_CARE_PROVIDER_SITE_OTHER): Payer: Medicare Other | Admitting: General Practice

## 2013-09-24 DIAGNOSIS — I2699 Other pulmonary embolism without acute cor pulmonale: Secondary | ICD-10-CM

## 2013-09-24 DIAGNOSIS — Z7901 Long term (current) use of anticoagulants: Secondary | ICD-10-CM

## 2013-09-29 ENCOUNTER — Encounter: Payer: Self-pay | Admitting: Cardiology

## 2013-09-29 ENCOUNTER — Telehealth: Payer: Self-pay

## 2013-09-29 NOTE — Telephone Encounter (Signed)
Yes she can take Imodium right ear and also should be on probiotics and if her diarrhea continues she needs to be checked

## 2013-09-29 NOTE — Telephone Encounter (Signed)
AMBER FROM [facility] STATES PT HAVE BEEN HAVING DIARRHEA FOR 4 DAYS NOW AND WOULD LIKE TO KNOW IF SHE CAN GIVE HER SOME EMORIDUM  PLEASE CALL [phone]

## 2013-09-29 NOTE — Telephone Encounter (Signed)
Dr Daub-can you advise? 

## 2013-09-29 NOTE — Telephone Encounter (Signed)
Yes, she can take immodium and she should also get  Probiotic, nurse Amber will advise patient.

## 2013-10-01 ENCOUNTER — Ambulatory Visit (INDEPENDENT_AMBULATORY_CARE_PROVIDER_SITE_OTHER): Payer: Medicare Other | Admitting: General Practice

## 2013-10-01 ENCOUNTER — Encounter: Payer: Self-pay | Admitting: Internal Medicine

## 2013-10-01 ENCOUNTER — Ambulatory Visit (INDEPENDENT_AMBULATORY_CARE_PROVIDER_SITE_OTHER): Payer: Medicare Other | Admitting: Internal Medicine

## 2013-10-01 VITALS — BP 114/68 | HR 117 | Temp 97.7°F | Ht 62.5 in | Wt 193.0 lb

## 2013-10-01 DIAGNOSIS — I2699 Other pulmonary embolism without acute cor pulmonale: Secondary | ICD-10-CM

## 2013-10-01 DIAGNOSIS — Z7901 Long term (current) use of anticoagulants: Secondary | ICD-10-CM

## 2013-10-01 NOTE — Progress Notes (Signed)
Subjective:    Patient ID: [name], female    DOB: 1946-02-03, 67 y.o.   MRN: [id number]  HPI 67 year old with a cardiac history, HTN and diabetes presenting to the hospital for left knee replacement. On 10/1 patient was found to be tachycardic and hypoxic. CTA was performed that showed a pulmonary embolism and PCCM was called on consultation. Patient denies previous blood clots or family history of blood clots. Has not been on blood thinners in the past. No hemoptysis or chest pain.  SIGNIFICANT EVENTS / STUDIES:  10/1 >>> CTA with PE.  10-14-13 - duplex LE - negative DVT  Oct 14, 2013 - ECHO - normal RV  Pulmonary arteries: Systolic pressure was mildly to moderately increased. PA peak pressure: 47mm Hg (S    ASSESSMENT / PLAN:  67 year old female with no previous blood clot history, currently on lovenox DVT prophylaxis who developed a provoked PE. Patient was on ticagrelor prior to surgery for stent placement. Communicated with ortho, ok to start anti-coagulant. This is a first PE that is provoked by surgery.   09/22/13: improved and stable to go home. Meets criteria 5d IV heparin + INR > 2 x 24h.No bleed. Looks well. Will be off brilinta. However, pulse ox 88% at Rest on ROom Air. So need 2L oxygen  Plan:  -DC heparin infusion; home 2 after dc (dc;ed at 13.50 approximately)  -Coumadin clinic; will have my office arrage  - -After provoked (post-ortho procedure) VTE, usual recommended duration is 3-6 months with 6 mos preferred if no problems with anticoagulation  -There is no reason to suspect a hypercoag state but apparently a panel has been sent already so will follow up on this  - START 2L o2 will monitor this at followup; but medicare refused   OV 10/01/2013 Followup hospital followup. Recent pulmonary embolism provoked after orthopedic surgery despite Lovenox treatment. Other risk factors obesity  -Since discharge she is slowly gaining her physical conditioning. Chest physical  therapy working with her. She still feels fatigued. She still has dyspnea although this is improved. There no new complaints. Her INR is being checked. Today it was 5.7 and Coumadin is on hold. There no new complaints husband is with heer. She plans to start outpatient physical therapy next week.  - Walk test today on room air with a walker 185 x feet x 1 laps on RA: stopped due to leg fatigue. NO desat below 93%  Review of Systems  Constitutional: Negative for fever and unexpected weight change.  HENT: Positive for rhinorrhea, sinus pressure and sore throat. Negative for congestion, dental problem, ear pain, nosebleeds, postnasal drip, sneezing and trouble swallowing.   Eyes: Negative for redness and itching.  Respiratory: Positive for cough, shortness of breath and wheezing. Negative for chest tightness.   Cardiovascular: Negative for palpitations and leg swelling.  Gastrointestinal: Positive for nausea. Negative for vomiting.  Genitourinary: Negative for dysuria.  Musculoskeletal: Negative for joint swelling.  Skin: Negative for rash.  Neurological: Negative for headaches.  Hematological: Does not bruise/bleed easily.  Psychiatric/Behavioral: Negative for dysphoric mood. The patient is not nervous/anxious.        Objective:   Physical Exam  Vitals reviewed. Constitutional: She is oriented to person, place, and time. She appears well-developed and well-nourished. No distress.  Looks deconditioned but better than in hospital  HENT:  Head: Normocephalic and atraumatic.  Right Ear: External ear normal.  Left Ear: External ear normal.  Mouth/Throat: Oropharynx is clear and moist. No  oropharyngeal exudate.  Eyes: Conjunctivae and EOM are normal. Pupils are equal, round, and reactive to light. Right eye exhibits no discharge. Left eye exhibits no discharge. No scleral icterus.  Neck: Normal range of motion. Neck supple. No JVD present. No tracheal deviation present. No thyromegaly present.   Cardiovascular: Normal rate, regular rhythm, normal heart sounds and intact distal pulses.  Exam reveals no gallop and no friction rub.   No murmur heard. Pulmonary/Chest: Effort normal and breath sounds normal. No respiratory distress. She has no wheezes. She has no rales. She exhibits no tenderness.  Abdominal: Soft. Bowel sounds are normal. She exhibits no distension and no mass. There is no tenderness. There is no rebound and no guarding.  Musculoskeletal: Normal range of motion. She exhibits no edema and no tenderness.  Using walker DJD  Lymphadenopathy:    She has no cervical adenopathy.  Neurological: She is alert and oriented to person, place, and time. She has normal reflexes. No cranial nerve deficit. She exhibits normal muscle tone. Coordination normal.  Skin: Skin is warm and dry. No rash noted. She is not diaphoretic. No erythema. No pallor.  Psychiatric: She has a normal mood and affect. Her behavior is normal. Judgment and thought content normal.          Assessment & Plan:

## 2013-10-01 NOTE — Patient Instructions (Addendum)
Minimum 6 month treatment with coumadin  - this mean through end march 2015 after this we can discuss stopping or continuing  _ Given fact it was PE in setting of obesity and while on Lovenox prophylaxis, I am in favor of 12-24 months coumadin depending on safety profile You can start with outpatient physical therapy next week Careful not to fall CMA Will ensure you have appointment with  coumadin clinic REturn in 4-6 weeks with me or my NP Tammy to report progress

## 2013-10-02 NOTE — Assessment & Plan Note (Signed)
Minimum 6 month treatment with coumadin  - this mean through end march 2015 after this we can discuss stopping or continuing  _ Given fact it was PE in setting of obesity and while on Lovenox prophylaxis, I am in favor of 12-24 months coumadin depending on safety profile You can start with outpatient physical therapy next week Careful not to fall CMA Will ensure you have appointment with [facility] [facility] REturn in 4-6 weeks with me or my NP Tammy to report progress   > 50% of this > 25 min visit spent in face to face counseling (15 min visit converted to 25 min)

## 2013-10-03 ENCOUNTER — Telehealth: Payer: Self-pay | Admitting: *Deleted

## 2013-10-03 MED ORDER — GLUCOSE BLOOD VI STRP: ORAL_STRIP | Status: DC

## 2013-10-03 NOTE — Telephone Encounter (Signed)
Refilled as requested  

## 2013-10-06 ENCOUNTER — Ambulatory Visit (INDEPENDENT_AMBULATORY_CARE_PROVIDER_SITE_OTHER): Payer: Medicare Other | Admitting: Family Medicine

## 2013-10-06 DIAGNOSIS — I2699 Other pulmonary embolism without acute cor pulmonale: Secondary | ICD-10-CM

## 2013-10-06 DIAGNOSIS — Z7901 Long term (current) use of anticoagulants: Secondary | ICD-10-CM

## 2013-10-06 LAB — POCT INR: INR: 1.6

## 2013-10-08 ENCOUNTER — Other Ambulatory Visit: Payer: Medicare Other

## 2013-10-08 ENCOUNTER — Encounter: Payer: Self-pay | Admitting: Cardiology

## 2013-10-08 ENCOUNTER — Ambulatory Visit (INDEPENDENT_AMBULATORY_CARE_PROVIDER_SITE_OTHER): Payer: Medicare Other | Admitting: Cardiology

## 2013-10-08 VITALS — BP 116/78 | HR 102 | Ht 63.5 in | Wt 192.0 lb

## 2013-10-08 DIAGNOSIS — I259 Chronic ischemic heart disease, unspecified: Secondary | ICD-10-CM

## 2013-10-08 DIAGNOSIS — IMO0001 Reserved for inherently not codable concepts without codable children: Secondary | ICD-10-CM

## 2013-10-08 DIAGNOSIS — I213 ST elevation (STEMI) myocardial infarction of unspecified site: Secondary | ICD-10-CM

## 2013-10-08 DIAGNOSIS — I219 Acute myocardial infarction, unspecified: Secondary | ICD-10-CM

## 2013-10-08 DIAGNOSIS — I119 Hypertensive heart disease without heart failure: Secondary | ICD-10-CM

## 2013-10-08 DIAGNOSIS — I2699 Other pulmonary embolism without acute cor pulmonale: Secondary | ICD-10-CM

## 2013-10-08 LAB — HEPATIC FUNCTION PANEL
ALT: 20 U/L (ref 0–35)
Albumin: 3.2 g/dL — ABNORMAL LOW (ref 3.5–5.2)
Alkaline Phosphatase: 70 U/L (ref 39–117)
Bilirubin, Direct: 0 mg/dL (ref 0.0–0.3)
Total Bilirubin: 0.7 mg/dL (ref 0.3–1.2)
Total Protein: 7.6 g/dL (ref 6.0–8.3)

## 2013-10-08 LAB — HEMOGLOBIN A1C: Hgb A1c MFr Bld: 7.1 % — ABNORMAL HIGH (ref 4.6–6.5)

## 2013-10-08 LAB — LIPID PANEL
HDL: 35.9 mg/dL — ABNORMAL LOW (ref >39.00)
LDL Cholesterol: 139 mg/dL — ABNORMAL HIGH (ref 0–99)
VLDL: 22.8 mg/dL (ref 0.0–40.0)

## 2013-10-08 NOTE — Patient Instructions (Signed)
Will obtain labs today and call you with the results (LP/BMET/HFP/A1C)  Your physician recommends that you continue on your current medications as directed. Please refer to the Current Medication list given to you today.  Your physician recommends that you schedule a follow-up appointment in: 2 MONTH OV/EKG

## 2013-10-08 NOTE — Assessment & Plan Note (Signed)
The patient has not been experiencing any recurrent chest discomfort to suggest angina pectoris.  She remains on baby aspirin for her drug-eluting stent.  She is no longer on dual antiplatelet therapy.

## 2013-10-08 NOTE — Assessment & Plan Note (Signed)
The patient is not having any hypoglycemic episodes.  She is on glyburide and metformin.  A1c is pending. She has lost 7 pounds since last visit.

## 2013-10-08 NOTE — Progress Notes (Signed)
[name] Date of Birth:  June 15, 1946 [address] [place], [place]  [address] [phone]         Fax   [phone]  History of Present Illness: This pleasant 67 year old woman is seen for a scheduled followup office visit. She has a prior history of known ischemic heart disease. On 05/29/12 she presented with acute chest pain and had a STEMI and was taken to the Cath Lab by Dr. 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 She was on dual antiplatelet therapy with Brilinta and aspirin for more than one year.  Her Brilinta was stopped in anticipation of recent left total knee surgery and she is continued on aspirin The patient also has a history of hypercholesterolemia, diabetes, and neurofibromatosis.  The patient underwent left total knee replacement by Dr. Sherlean Foot on 09/15/13.  2 days later she had acute dyspnea and was found to have pulmonary emboli and 09/17/13.  Venous duplex of her lower extremities was negative for deep vein thrombosis.  She was treated with Lovenox bridging and Coumadin.  Her Coumadin is being followed at the pulmonary Coumadin clinic and by Dr. Marchelle Gearing.   Current Outpatient Prescriptions  Medication Sig Dispense Refill  . aspirin 81 MG tablet Take 1 tablet (81 mg total) by mouth daily.      . Calcium Carbonate-Vitamin D (CALCIUM + D PO) Take 1 tablet by mouth daily.       . fish oil-omega-3 fatty acids 1000 MG capsule Take 1 g by mouth daily.       Marland Kitchen glimepiride (AMARYL) 2 MG tablet TAKE 2 TABLETS BY MOUTH DAILY BEFORE BREAKFAST  60 tablet  6  . glucose blood test strip ONE TOUCH ULTRA TEST STRIPS, TEST DAILY DIAGNOSIS 250.02  100 each  12  . guaiFENesin (MUCINEX) 600 MG 12 hr tablet Take 600 mg by mouth 2 (two) times daily. PRN      . losartan (COZAAR) 25 MG tablet Take 25 mg by mouth daily.      . metFORMIN (GLUCOPHAGE) 1000 MG tablet Take 1 tablet (1,000 mg total) by mouth 2 (two) times daily.  180 tablet   3  . metoprolol succinate (TOPROL-XL) 25 MG 24 hr tablet Take 25 mg by mouth at bedtime.      . Multiple Vitamins-Minerals (OCUVITE PO) Take 1 tablet by mouth daily as needed (for vitamin - for eyes).       . nitroGLYCERIN (NITROSTAT) 0.4 MG SL tablet Place 1 tablet (0.4 mg total) under the tongue every 5 (five) minutes as needed for chest pain (up to 3 doses).  25 tablet  3  . promethazine (PHENERGAN) 50 MG tablet Take 1 tablet (50 mg total) by mouth every 6 (six) hours as needed for nausea.  30 tablet  1  . warfarin (COUMADIN) 5 MG tablet Take 5 mg by mouth as directed.      . methocarbamol (ROBAXIN) 500 MG tablet Take 1-2 tablets (500-1,000 mg total) by mouth every 6 (six) hours as needed.  30 tablet  0   No current facility-administered medications for this visit.    Allergies  Allergen Reactions  . Amaryl Nausea Only  . Codeine Nausea And Vomiting  . Crestor [Rosuvastatin Calcium]   . Hydrocodone     Nausea and vomiting  . Lipitor [Atorvastatin Calcium]     MYALGIAS  . Lovastatin     MYALGIAS   . Niaspan [Niacin Er] Nausea Only  .  Robaxin [Methocarbamol]     Made patient feel funny  . Sumycin [Tetracycline Hcl]   . Zetia [Ezetimibe]   . Zithromax [Azithromycin]   . Zocor [Simvastatin - High Dose]     MYALGIAS  . Zocor [Simvastatin]     Muscle pain    Patient Active Problem List   Diagnosis Date Noted  . Fatigue 03/10/2011    Priority: High  . Chest pain at rest 03/10/2011    Priority: High  . Acute sinusitis, unspecified 03/10/2011    Priority: High  . HTN (hypertension)     Priority: Medium  . Long term (current) use of anticoagulants 09/24/2013  . Pulmonary embolism 09/18/2013  . Acute pulmonary embolism 09/17/2013  . Hypoxemia 09/17/2013  . Onychomycosis 04/29/2013  . Pain in joint, ankle and foot 04/29/2013  . Benign hypertensive heart disease without heart failure 10/15/2012  . STEMI (ST elevation myocardial infarction) 05/30/2012  . Dyspnea 11/15/2011    . Herpes zoster 07/17/2011  . Neurofibromatosis   . Type II or unspecified type diabetes mellitus without mention of complication, uncontrolled   . Obesity   . Osteoarthritis, knee     History  Smoking status  . Never Smoker   Smokeless tobacco  . Never Used    History  Alcohol Use No    Family History  Problem Relation Age of Onset  . Heart disease Mother   . Arthritis Mother   . Stroke Mother   . Heart disease Son     Review of Systems: Constitutional: no fever chills diaphoresis or fatigue or change in weight.  Head and neck: no hearing loss, no epistaxis, no photophobia or visual disturbance. Respiratory: No cough, shortness of breath or wheezing. Cardiovascular: No chest pain peripheral edema, palpitations. Gastrointestinal: No abdominal distention, no abdominal pain, no change in bowel habits hematochezia or melena. Genitourinary: No dysuria, no frequency, no urgency, no nocturia. Musculoskeletal:No arthralgias, no back pain, no gait disturbance or myalgias. Neurological: No dizziness, no headaches, no numbness, no seizures, no syncope, no weakness, no tremors. Hematologic: No lymphadenopathy, no easy bruising. Psychiatric: No confusion, no hallucinations, no sleep disturbance.    Physical Exam: Filed Vitals:   10/08/13 0939  BP: 116/78  Pulse: 102   the general appearance reveals a well-developed well-nourished woman in no distress.  She has the typical skin lesions of neurofibromatosis.The head and neck exam reveals pupils equal and reactive.  Extraocular movements are full.  There is no scleral icterus.  The mouth and pharynx are normal.  The neck is supple.  The carotids reveal no bruits.  The jugular venous pressure is normal.  The  thyroid is not enlarged.  There is no lymphadenopathy.  The chest is clear to percussion and auscultation.  There are no rales or rhonchi.  Expansion of the chest is symmetrical.  The precordium is quiet.  The first heart sound is  normal.  The second heart sound is physiologically split.  There is no murmur gallop rub or click.  There is no abnormal lift or heave.  The abdomen is soft and nontender.  The bowel sounds are normal.  The liver and spleen are not enlarged.  There are no abdominal masses.  There are no abdominal bruits.  Extremities reveal good pedal pulses.  There is no phlebitis or edema.  There is no cyanosis or clubbing.  Strength is normal and symmetrical in all extremities.  There is no lateralizing weakness.  There are no sensory deficits.  The skin is  warm and dry.  There is no rash.     Assessment / Plan: Overall the patient is doing reasonably well now.  Her right knee replacement looks good.  She is not having any symptoms of ongoing pulmonary emboli or of angina pectoris.  She will continue same medication.  Recheck in 2 months for office visit and EKG.

## 2013-10-08 NOTE — Assessment & Plan Note (Signed)
Blood pressure was remaining stable on current therapy.  No headaches.  No dizziness or syncope.  No palpitations.

## 2013-10-08 NOTE — Assessment & Plan Note (Signed)
Her exertional dyspnea is gradually improving.  She is not having any pleuritic chest pain.

## 2013-10-08 NOTE — Progress Notes (Signed)
Quick Note:  Please report to patient. The recent labs are stable. Continue same medication and careful diet. The A1C is down to 7.1 better. The LDL is still too high for diabetic with CAD. She is intolerant to statins and zetia. Add Welchol 1 tablet BID ______

## 2013-10-14 ENCOUNTER — Ambulatory Visit (INDEPENDENT_AMBULATORY_CARE_PROVIDER_SITE_OTHER): Payer: Medicare Other | Admitting: General Practice

## 2013-10-14 ENCOUNTER — Telehealth: Payer: Self-pay | Admitting: Cardiology

## 2013-10-14 DIAGNOSIS — Z7901 Long term (current) use of anticoagulants: Secondary | ICD-10-CM

## 2013-10-14 DIAGNOSIS — I2699 Other pulmonary embolism without acute cor pulmonale: Secondary | ICD-10-CM

## 2013-10-14 LAB — POCT INR: INR: 1.7

## 2013-10-14 MED ORDER — COLESEVELAM HCL 625 MG PO TABS: 625.0000 mg | ORAL_TABLET | Freq: Two times a day (BID) | ORAL | Status: DC

## 2013-10-14 NOTE — Telephone Encounter (Signed)
Follow Up:  Pt states she is returning Melinda's call

## 2013-10-14 NOTE — Telephone Encounter (Signed)
New message   Returning Melinda's call from yesterday.  Will be there until approx 10:30 this am.

## 2013-10-14 NOTE — Telephone Encounter (Signed)
Advised patient of lab results and medication to add

## 2013-10-14 NOTE — Telephone Encounter (Signed)
Message copied by Burnell Blanks on Tue Oct 14, 2013  6:46 PM ------      Message from: Cassell Clement      Created: Wed Oct 08, 2013  7:57 PM       Please report to patient.  The recent labs are stable. Continue same medication and careful diet. The A1C is down to 7.1 better.      The LDL is still too high for diabetic with CAD.  She is intolerant to statins and zetia. Add Welchol 1 tablet BID ------

## 2013-10-24 ENCOUNTER — Telehealth: Payer: Self-pay | Admitting: Internal Medicine

## 2013-10-24 NOTE — Telephone Encounter (Signed)
ATC pt line busy x 3 wcb 

## 2013-10-27 NOTE — Telephone Encounter (Signed)
Spoke with pt and advised her once she sees TP on 11-11-13 she will decide on CXR at that time. Carron Curie, CMA

## 2013-10-28 ENCOUNTER — Ambulatory Visit (INDEPENDENT_AMBULATORY_CARE_PROVIDER_SITE_OTHER): Payer: Medicare Other | Admitting: General Practice

## 2013-10-28 DIAGNOSIS — Z7901 Long term (current) use of anticoagulants: Secondary | ICD-10-CM

## 2013-10-28 DIAGNOSIS — I2699 Other pulmonary embolism without acute cor pulmonale: Secondary | ICD-10-CM

## 2013-10-28 LAB — POCT INR: INR: 1.6

## 2013-10-28 NOTE — Progress Notes (Signed)
Pre-visit discussion using our clinic review tool. No additional management support is needed unless otherwise documented below in the visit note.  

## 2013-11-04 ENCOUNTER — Ambulatory Visit: Payer: Medicare Other | Admitting: Podiatry

## 2013-11-05 ENCOUNTER — Encounter: Payer: Self-pay | Admitting: Podiatry

## 2013-11-05 ENCOUNTER — Ambulatory Visit (INDEPENDENT_AMBULATORY_CARE_PROVIDER_SITE_OTHER): Payer: Medicare Other | Admitting: Podiatry

## 2013-11-05 VITALS — BP 152/79 | HR 103 | Ht 63.0 in | Wt 192.0 lb

## 2013-11-05 DIAGNOSIS — M25579 Pain in unspecified ankle and joints of unspecified foot: Secondary | ICD-10-CM

## 2013-11-05 DIAGNOSIS — B351 Tinea unguium: Secondary | ICD-10-CM

## 2013-11-05 NOTE — Patient Instructions (Signed)
Seen for hypertrophic nails. All nails debrided. Return in 3 months or as needed.  

## 2013-11-05 NOTE — Progress Notes (Signed)
Subjective: 67 y.o. year old female patient presents complaining of painful nails. Patient requests toe nails, corns and calluses trimmed.   Objective: Dermatologic: Thick yellow deformed nails x 10. Vascular: Pedal pulses are all palpable. Orthopedic: Contracted lesser digits  Neurologic: All epicritic and tactile sensations grossly intact.  Assessment: Dystrophic mycotic nails x 10.  Treatment: All mycotic nails debrided.  Return in 3 months or as needed.

## 2013-11-11 ENCOUNTER — Ambulatory Visit (INDEPENDENT_AMBULATORY_CARE_PROVIDER_SITE_OTHER): Payer: Medicare Other | Admitting: Adult Health

## 2013-11-11 ENCOUNTER — Encounter: Payer: Self-pay | Admitting: Adult Health

## 2013-11-11 ENCOUNTER — Ambulatory Visit (INDEPENDENT_AMBULATORY_CARE_PROVIDER_SITE_OTHER): Payer: Medicare Other | Admitting: General Practice

## 2013-11-11 VITALS — BP 132/74 | HR 103 | Temp 98.0°F | Ht 63.0 in | Wt 196.6 lb

## 2013-11-11 DIAGNOSIS — Z7901 Long term (current) use of anticoagulants: Secondary | ICD-10-CM

## 2013-11-11 DIAGNOSIS — I2699 Other pulmonary embolism without acute cor pulmonale: Secondary | ICD-10-CM

## 2013-11-11 NOTE — Patient Instructions (Signed)
Continue on coumadin as directed. INR goal is 2-3.  Advance activity as tolerated.  follow up Dr. Marchelle Gearing in 6 weeks and As needed

## 2013-11-11 NOTE — Progress Notes (Signed)
Pre-visit discussion using our clinic review tool. No additional management support is needed unless otherwise documented below in the visit note.  

## 2013-11-11 NOTE — Progress Notes (Deleted)
Subjective:    Patient ID: [name], female    DOB: 08-30-1946, 67 y.o.   MRN: [id number]  HPI  67 year old with a cardiac history, HTN and diabetes presenting to the hospital for left knee replacement. On 10/1 patient was found to be tachycardic and hypoxic. CTA was performed that showed a pulmonary embolism and PCCM was called on consultation. Patient denies previous blood clots or family history of blood clots. Has not been on blood thinners in the past. No hemoptysis or chest pain.  SIGNIFICANT EVENTS / STUDIES:  10/1 >>> CTA with PE.  2013-10-04 - duplex LE - negative DVT  10/04/13 - ECHO - normal RV  Pulmonary arteries: Systolic pressure was mildly to moderately increased. PA peak pressure: 47mm Hg (S    ASSESSMENT / PLAN:  67 year old female with no previous blood clot history, currently on lovenox DVT prophylaxis who developed a provoked PE. Patient was on ticagrelor prior to surgery for stent placement. Communicated with ortho, ok to start anti-coagulant. This is a first PE that is provoked by surgery.   09/22/13: improved and stable to go home. Meets criteria 5d IV heparin + INR > 2 x 24h.No bleed. Looks well. Will be off brilinta. However, pulse ox 88% at Rest on ROom Air. So need 2L oxygen  Plan:  -DC heparin infusion; home 2 after dc (dc;ed at 13.50 approximately)  -Coumadin clinic; will have my office arrage  - -After provoked (post-ortho procedure) VTE, usual recommended duration is 3-6 months with 6 mos preferred if no problems with anticoagulation  -There is no reason to suspect a hypercoag state but apparently a panel has been sent already so will follow up on this  - START 2L o2 will monitor this at followup; but medicare refused   OV 11/11/2013 Followup hospital followup. Recent pulmonary embolism provoked after orthopedic surgery despite Lovenox treatment. Other risk factors obesity  -Since discharge she is slowly gaining her physical conditioning. Chest  physical therapy working with her. She still feels fatigued. She still has dyspnea although this is improved. There no new complaints. Her INR is being checked. Today it was 5.7 and Coumadin is on hold. There no new complaints husband is with heer. She plans to start outpatient physical therapy next week.  - Walk test today on room air with a walker 185 x feet x 1 laps on RA: stopped due to leg fatigue. NO desat below 93%  Review of Systems  Constitutional: Negative for fever and unexpected weight change.  HENT: Positive for rhinorrhea, sinus pressure and sore throat. Negative for congestion, dental problem, ear pain, nosebleeds, postnasal drip, sneezing and trouble swallowing.   Eyes: Negative for redness and itching.  Respiratory: Positive for cough, shortness of breath and wheezing. Negative for chest tightness.   Cardiovascular: Negative for palpitations and leg swelling.  Gastrointestinal: Positive for nausea. Negative for vomiting.  Genitourinary: Negative for dysuria.  Musculoskeletal: Negative for joint swelling.  Skin: Negative for rash.  Neurological: Negative for headaches.  Hematological: Does not bruise/bleed easily.  Psychiatric/Behavioral: Negative for dysphoric mood. The patient is not nervous/anxious.        Objective:   Physical Exam  Vitals reviewed. Constitutional: She is oriented to person, place, and time. She appears well-developed and well-nourished. No distress.  Looks deconditioned but better than in hospital  HENT:  Head: Normocephalic and atraumatic.  Right Ear: External ear normal.  Left Ear: External ear normal.  Mouth/Throat: Oropharynx is clear and moist.  No oropharyngeal exudate.  Eyes: Conjunctivae and EOM are normal. Pupils are equal, round, and reactive to light. Right eye exhibits no discharge. Left eye exhibits no discharge. No scleral icterus.  Neck: Normal range of motion. Neck supple. No JVD present. No tracheal deviation present. No thyromegaly  present.  Cardiovascular: Normal rate, regular rhythm, normal heart sounds and intact distal pulses.  Exam reveals no gallop and no friction rub.   No murmur heard. Pulmonary/Chest: Effort normal and breath sounds normal. No respiratory distress. She has no wheezes. She has no rales. She exhibits no tenderness.  Abdominal: Soft. Bowel sounds are normal. She exhibits no distension and no mass. There is no tenderness. There is no rebound and no guarding.  Musculoskeletal: Normal range of motion. She exhibits no edema and no tenderness.  Using walker DJD  Lymphadenopathy:    She has no cervical adenopathy.  Neurological: She is alert and oriented to person, place, and time. She has normal reflexes. No cranial nerve deficit. She exhibits normal muscle tone. Coordination normal.  Skin: Skin is warm and dry. No rash noted. She is not diaphoretic. No erythema. No pallor.  Psychiatric: She has a normal mood and affect. Her behavior is normal. Judgment and thought content normal.          Assessment & Plan:

## 2013-11-12 NOTE — Progress Notes (Signed)
67 year old with a cardiac history, HTN and diabetes presenting to the hospital for left knee replacement. On 10/1 patient was found to be tachycardic and hypoxic. CTA was performed that showed a pulmonary embolism and PCCM was called on consultation. Patient denies previous blood clots or family history of blood clots. Has not been on blood thinners in the past. No hemoptysis or chest pain.  SIGNIFICANT EVENTS / STUDIES:  10/1 >>> CTA with PE.  2013/10/17 - duplex LE - negative DVT  10/17/13 - ECHO - normal RV  Pulmonary arteries: Systolic pressure was mildly to moderately increased. PA peak pressure: 47mm Hg (S    ASSESSMENT / PLAN:  67 year old female with no previous blood clot history, currently on lovenox DVT prophylaxis who developed a provoked PE. Patient was on ticagrelor prior to surgery for stent placement. Communicated with ortho, ok to start anti-coagulant. This is a first PE that is provoked by surgery.   09/22/13: improved and stable to go home. Meets criteria 5d IV heparin + INR > 2 x 24h.No bleed. Looks well. Will be off brilinta. However, pulse ox 88% at Rest on ROom Air. So need 2L oxygen  Plan:  -DC heparin infusion; home 2 after dc (dc;ed at 13.50 approximately)  -Coumadin clinic; will have my office arrage  - -After provoked (post-ortho procedure) VTE, usual recommended duration is 3-6 months with 6 mos preferred if no problems with anticoagulation  -There is no reason to suspect a hypercoag state but apparently a panel has been sent already so will follow up on this  - START 2L o2 will monitor this at followup; but medicare refused   OV 10/01/13  Followup hospital followup. Recent pulmonary embolism provoked after orthopedic surgery despite Lovenox treatment. Other risk factors obesity  -Since discharge she is slowly gaining her physical conditioning. Chest physical therapy working with her. She still feels fatigued. She still has dyspnea although this is improved. There no  new complaints. Her INR is being checked. Today it was 5.7 and Coumadin is on hold. There no new complaints husband is with heer. She plans to start outpatient physical therapy next week.  - Walk test today on room air with a walker 185 x feet x 1 laps on RA: stopped  due to leg fatigue. NO desat below 93%  11/11/13 Follow up  6 week follow up PE - reports dyspnea is about the same since last ov.  Doing well. No hemoptysis, chest pain, orthopnea, or edema.  Going to the [facility] every 2 weeks. INR this am was 1.7. Coumadin was adjusted to reach goal 2-3 . Says no missed doses of coumadin.  DOE is improved .   ROS Constitutional:   No  weight loss, night sweats,  Fevers, chills,  +fatigue, or  lassitude.  HEENT:   No headaches,  Difficulty swallowing,  Tooth/dental problems, or  Sore throat,                No sneezing, itching, ear ache,  +nasal congestion, post nasal drip,   CV:  No chest pain,  Orthopnea, PND, swelling in lower extremities, anasarca, dizziness, palpitations, syncope.   GI  No heartburn, indigestion, abdominal pain, nausea, vomiting, diarrhea, change in bowel habits, loss of appetite, bloody stools.   Resp:    No chest wall deformity  Skin: no rash or lesions.  GU: no dysuria, change in color of urine, no urgency or frequency.  No flank pain, no hematuria  MS:  No joint pain or swelling.  No decreased range of motion.  No back pain.  Psych:  No change in mood or affect. No depression or anxiety.  No memory loss.      EXAM  GEN: A/Ox3; pleasant , NAD   HEENT:  [place]/AT,  EACs-clear, TMs-wnl, NOSE-clear, THROAT-clear, no lesions, no postnasal drip or exudate noted.   NECK:  Supple w/ fair ROM; no JVD; normal carotid impulses w/o bruits; no thyromegaly or nodules palpated; no lymphadenopathy.  RESP  Clear  P & A; w/o, wheezes/ rales/ or rhonchi.no accessory muscle use, no dullness to percussion  CARD:  RRR, no m/r/g  , no peripheral edema, pulses intact, no  cyanosis or clubbing.  GI:   Soft & nt; nml bowel sounds; no organomegaly or masses detected.  Musco: Warm bil, no deformities or joint swelling noted.   Neuro: alert, no focal deficits noted.    Skin: Warm, no lesions or rashes

## 2013-11-17 NOTE — Assessment & Plan Note (Signed)
Doing well on coumadin without any known complications.  Need to keep INR above 2 w/ goal 2-3.  Coumadin Clinic was contacted- pt has been sub-therapeutic -needs more closely  Pt to follow up with them next week for closer montioring , if not able to get to goal and maintain may need to consider Xarelto , etc   Plan  Continue on coumadin as directed. INR goal is 2-3.  Advance activity as tolerated.  follow up Dr. Marchelle Gearing in 6 weeks and As needed

## 2013-11-18 ENCOUNTER — Telehealth: Payer: Self-pay | Admitting: Internal Medicine

## 2013-11-18 NOTE — Telephone Encounter (Signed)
Spoke with the pt  She states that her question was already addressed by nurse at the Coumadin Clinic  Nothing further needed

## 2013-11-21 ENCOUNTER — Ambulatory Visit (INDEPENDENT_AMBULATORY_CARE_PROVIDER_SITE_OTHER): Payer: Medicare Other | Admitting: General Practice

## 2013-11-21 DIAGNOSIS — I2699 Other pulmonary embolism without acute cor pulmonale: Secondary | ICD-10-CM

## 2013-11-21 DIAGNOSIS — Z7901 Long term (current) use of anticoagulants: Secondary | ICD-10-CM

## 2013-11-21 LAB — POCT INR: INR: 3.1

## 2013-11-21 NOTE — Progress Notes (Signed)
Pre-visit discussion using our clinic review tool. No additional management support is needed unless otherwise documented below in the visit note.  

## 2013-11-26 ENCOUNTER — Ambulatory Visit: Payer: Medicare Other | Admitting: Cardiology

## 2013-11-26 ENCOUNTER — Ambulatory Visit
Admission: RE | Admit: 2013-11-26 | Discharge: 2013-11-26 | Disposition: A | Discharge status: Home / Self Care | Payer: Medicare Other | Source: Ambulatory Visit | Attending: Emergency Medicine | Admitting: Emergency Medicine

## 2013-11-26 ENCOUNTER — Ambulatory Visit (INDEPENDENT_AMBULATORY_CARE_PROVIDER_SITE_OTHER): Payer: Medicare Other | Admitting: Emergency Medicine

## 2013-11-26 VITALS — BP 140/78 | HR 103 | Temp 97.6°F | Resp 18 | Ht 63.0 in | Wt 193.0 lb

## 2013-11-26 DIAGNOSIS — I2699 Other pulmonary embolism without acute cor pulmonale: Secondary | ICD-10-CM

## 2013-11-26 DIAGNOSIS — S91109A Unspecified open wound of unspecified toe(s) without damage to nail, initial encounter: Secondary | ICD-10-CM

## 2013-11-26 DIAGNOSIS — M549 Dorsalgia, unspecified: Secondary | ICD-10-CM

## 2013-11-26 DIAGNOSIS — M79609 Pain in unspecified limb: Secondary | ICD-10-CM

## 2013-11-26 DIAGNOSIS — M79662 Pain in left lower leg: Secondary | ICD-10-CM

## 2013-11-26 LAB — POCT SKIN KOH: Skin KOH, POC: NEGATIVE

## 2013-11-26 MED ORDER — MUPIROCIN 2 % EX OINT: 1.0000 "application " | TOPICAL_OINTMENT | Freq: Two times a day (BID) | CUTANEOUS | Status: DC

## 2013-11-26 NOTE — Progress Notes (Signed)
   Subjective:    Patient ID: [name], female    DOB: 1946-12-02, 67 y.o.   MRN: [id number]  HPI Pt with recent history of PE. She had surgery on her knee and she started throwing blood clots and some went to her lung. She says she is still SOB now and then. She is having bilateral leg swelling today. Patient recently had surgery on her left knee. Following her surgery she had a pulmonary embolus. She is currently followed by Dr. Marchelle Gearing. They have had difficulty adjusting her PT/INR. She enters today because she is worried about her legs. She has a soreness in the back of her right leg. She also has an irritated area between the fourth and fifth toes of the right foot. She has been using Neosporin ointment for this. She is to also been having some pain in the left side of her back. Soreness in this area.    Review of Systems     Objective:   Physical Exam patient is alert and cooperative. She is in no distress. Her neck is supple. Her chest is clear to both auscultation and percussion. Examination of the left knee reveals a healing surgical incision. She has good range of motion of the knee. There is trace swelling of the left ankle. There is 1+ swelling of the right ankle. There is mild calf tenderness. There is an ecchymotic red area between the fourth and fifth toes. Results for orders placed in visit on 11/26/13  POCT SKIN KOH      Result Value Range   Skin KOH, POC Negative           Assessment & Plan:  I'm going to treat the area of inflammation between her fourth and fifth toes with soap and water cleaning followed by application of Bactroban. I did culture the area . No oral antibiotics were prescribed yet. She also had a question about taking welchol  for an elevated LDL. She would like to hold off on this until after she gets her clot treated and she is off of her other medication. Ultrasound showed a clot in the left greater saphenous but no clot in the deep system.

## 2013-11-28 ENCOUNTER — Telehealth: Payer: Self-pay

## 2013-11-28 LAB — WOUND CULTURE
Gram Stain: NONE SEEN
Organism ID, Bacteria: NO GROWTH

## 2013-11-28 NOTE — Telephone Encounter (Signed)
She has thrombosis, but is not deep. Advise if okay for therapy.

## 2013-11-28 NOTE — Telephone Encounter (Signed)
Discussed with Dr Cleta Alberts patient advised to hold therapy today, but to not cancel entirely. Her next appointment is Tuesday next week She will see you Wed next week.  She was advised the culture was negative, wants you to advise further on this.

## 2013-11-28 NOTE — Telephone Encounter (Signed)
Patient called wanting to know if she can still take therapy with the blood clot that was found in her body. Patient wants the medical staff to inquire about this to Dr. Cleta Alberts and let her know if it is safe to attend therapy with her blood clot issue. Patient wants call ASAP as she called earlier this week, but no one completed a phone message for patient. Patient's phone number is [phone]. Thank You!

## 2013-11-28 NOTE — Telephone Encounter (Signed)
IMPRESSION:  1. Although there is no deep vein thrombosis, there is thrombosis of  the left greater saphenous vein. This can propagate to the deep  venous system and may warrant treatment/follow-up.  2. Compressible subcutaneous venous varicosities.

## 2013-11-28 NOTE — Telephone Encounter (Signed)
Patient called wanting to know if she can still take therapy with the blood clot that was found in her body. Patient wants the medical staff to inquire about this to Dr. Cleta Alberts and let her know if it is safe to attend therapy with her blood clot issue. Patient wants call ASAP as she called earlier this week, but no one completed a phone message for patient. Patient's phone number is [phone]. Thank You! [phone]

## 2013-11-28 NOTE — Telephone Encounter (Signed)
I have spoken to patient / see previous message.

## 2013-11-30 ENCOUNTER — Other Ambulatory Visit: Payer: Self-pay | Admitting: Emergency Medicine

## 2013-11-30 DIAGNOSIS — I2699 Other pulmonary embolism without acute cor pulmonale: Secondary | ICD-10-CM

## 2013-12-01 ENCOUNTER — Telehealth: Payer: Self-pay | Admitting: Radiology

## 2013-12-01 ENCOUNTER — Telehealth: Payer: Self-pay | Admitting: Internal Medicine

## 2013-12-01 NOTE — Telephone Encounter (Signed)
C/D 12/01/13 for appt. 12/02/13 

## 2013-12-01 NOTE — Telephone Encounter (Signed)
S/W PT AND GVE NP APPT 12/16 @ 11 W/DR. MOHAMED REFERRING DR. Viviann Spare DAUB DX- ACUTE PE

## 2013-12-01 NOTE — Telephone Encounter (Signed)
Would stay off and keep elevated until feeling better then resume

## 2013-12-01 NOTE — Telephone Encounter (Signed)
Thanks. Called patient to advise. She states she is still painful, so she should not go. I have advised her not to do the physical therapy.

## 2013-12-01 NOTE — Telephone Encounter (Signed)
Will forward to Dr. Sherene Sires for recs Please advise thanks!

## 2013-12-01 NOTE — Telephone Encounter (Signed)
Patient being treated for superficial thrombosis in her saphenous vein, since it is large and could become a DVT. She has history of PE, Dr Cleta Alberts wants to know if she should go to physical therapy, or if she should hold this, was held last week. Please advise, we can advise patient.

## 2013-12-02 ENCOUNTER — Encounter: Payer: Self-pay | Admitting: Internal Medicine

## 2013-12-02 ENCOUNTER — Other Ambulatory Visit: Payer: Self-pay | Admitting: Internal Medicine

## 2013-12-02 ENCOUNTER — Ambulatory Visit: Payer: Medicare Other

## 2013-12-02 ENCOUNTER — Ambulatory Visit (HOSPITAL_BASED_OUTPATIENT_CLINIC_OR_DEPARTMENT_OTHER): Payer: Medicare Other | Admitting: Internal Medicine

## 2013-12-02 ENCOUNTER — Other Ambulatory Visit: Payer: Medicare Other

## 2013-12-02 VITALS — BP 134/79 | HR 93 | Temp 97.2°F | Resp 18 | Ht 63.0 in | Wt 196.4 lb

## 2013-12-02 DIAGNOSIS — Z7901 Long term (current) use of anticoagulants: Secondary | ICD-10-CM

## 2013-12-02 DIAGNOSIS — M7989 Other specified soft tissue disorders: Secondary | ICD-10-CM

## 2013-12-02 DIAGNOSIS — I2699 Other pulmonary embolism without acute cor pulmonale: Secondary | ICD-10-CM

## 2013-12-02 DIAGNOSIS — I82819 Embolism and thrombosis of superficial veins of unspecified lower extremities: Secondary | ICD-10-CM

## 2013-12-02 DIAGNOSIS — M79609 Pain in unspecified limb: Secondary | ICD-10-CM

## 2013-12-02 NOTE — Progress Notes (Signed)
[facility] [phone]   [phone]  CONSULT NOTE  REFERRING PHYSICIAN: Dr. Marchelle Gearing  REASON FOR CONSULTATION:  67 years old white female with history of pulmonary embolism  HPI [name] is a 67 y.o. female was past medical history significant for hypertension, diabetes, moderate opacity, herpes zoster, neurofibromatosis, coronary artery disease status post stent placement and hypercholesterolemia. The patient mentions that on 09/15/2013 she had left knee replacement. Few days later she developed swelling of the left lower extremity as well as shortness of breath. CT angiogram of the chest performed on 09/17/2013 showed extensive pulmonary embolism bilaterally with pulmonary embolus on the right arising in the distal right main pulmonary artery. There are pulmonary emboli bilaterally in the upper and lower lobe branches.  She had a duplex of the lower extremities performed on 09/18/2013 and showed no evidence of deep venous thrombosis involving the visualized veins of the right lower extremity and no evidence of deep venous thrombosis involving the visualized veins of the left lower extremity. The patient was started on treatment with Lovenox followed by Coumadin and currently followed by the [facility]. She is currently on Coumadin 5 mg by mouth daily except Monday and Friday 2.5 mg. Hypercoagulable workup performed during her hospitalization on 09/17/2013 showed no significant abnormalities except for mild low protein S activity. The patient has no lupus anticoagulant, no factor V Leiden mutation or prothrombin gene mutation abnormalities.  She has been tolerating her treatment with Coumadin fairly well but I now dose for a subtherapeutic than therapeutic or supra-therapeutic. Recently she was seen by her primary care physician Dr. Cleta Alberts complaining of increasing swelling and pain in the left lower extremity.  Repeat bilateral lower  extremity venous Doppler ultrasound by radiology showed no deep venous thrombosis but there is a thrombosis of the left greater saphenous vein. The scan propagated to the deep venous system and mid on treatment/followup.  The patient was referred to me today for further evaluation and recommendation regarding treatment of her condition. When seen today she is feeling fine with no specific complaints except for mild pain and mild swelling of the left lower extremity. The patient continues to have baseline shortness breath increased with exertion. She denied having any significant chest pain, cough or hemoptysis. She denied having any weight loss or night sweats. She denied having any bleeding, bruises or ecchymosis.  Family history significant for mother and father with diabetes mellitus, coronary artery disease and stroke. The patient is married and has 2 sons. She was accompanied by her husband [name] today. She used to work in [employer]. She has no history of smoking, alcohol or drug abuse. HPI  Past Medical History  Diagnosis Date  . Neurofibromatosis   . Diabetes mellitus   . HTN (hypertension)   . Obesity   . Osteoarthritis, knee   . Hyperlipidemia     statin intolerant. LDL is 83  . UTI (lower urinary tract infection) 05/29/12  . 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 Cardiac catheterization    . Tonsillectomy and adenoidectomy    . Clavicle surgery    . Abdominal hysterectomy    . Ankle fracture surgery Right   . Total knee arthroplasty Left 09/15/2013    Procedure: TOTAL KNEE ARTHROPLASTY;  Surgeon: Dannielle Huh, MD;  Location: [facility] OR;  Service: Orthopedics;  Laterality: Left;    Family History  Problem Relation Age of Onset  . Heart disease Mother   . Arthritis Mother   . Stroke Mother   . Heart disease Son     Social History History  Substance Use  Topics  . Smoking status: Never Smoker   . Smokeless tobacco: Never Used  . Alcohol Use: No    Allergies  Allergen Reactions  . Amaryl Nausea Only  . Codeine Nausea And Vomiting  . Crestor [Rosuvastatin Calcium]   . Hydrocodone     Nausea and vomiting  . Lipitor [Atorvastatin Calcium]     MYALGIAS  . Lovastatin     MYALGIAS   . Niaspan [Niacin Er] Nausea Only  . Robaxin [Methocarbamol]     Made patient feel funny  . Sumycin [Tetracycline Hcl]   . Zetia [Ezetimibe]   . Zithromax [Azithromycin]   . Zocor [Simvastatin]     Muscle pain    Current Outpatient Prescriptions  Medication Sig Dispense Refill  . aspirin 81 MG tablet Take 1 tablet (81 mg total) by mouth daily.      . Calcium Carbonate-Vitamin D (CALCIUM + D PO) Take 1 tablet by mouth daily.       . colesevelam (WELCHOL) 625 MG tablet Take 1 tablet (625 mg total) by mouth 2 (two) times daily with a meal.  60 tablet  5  . fish oil-omega-3 fatty acids 1000 MG capsule Take 1 g by mouth daily.       Marland Kitchen glimepiride (AMARYL) 2 MG tablet TAKE 2 TABLETS BY MOUTH DAILY BEFORE BREAKFAST  60 tablet  6  . glucose blood test strip ONE TOUCH ULTRA TEST STRIPS, TEST DAILY DIAGNOSIS 250.02  100 each  12  . guaiFENesin (MUCINEX) 600 MG 12 hr tablet Take 600 mg by mouth 2 (two) times daily. PRN      . losartan (COZAAR) 25 MG tablet Take 25 mg by mouth daily.      . metFORMIN (GLUCOPHAGE) 1000 MG tablet Take 1 tablet (1,000 mg total) by mouth 2 (two) times daily.  180 tablet  3  . metoprolol succinate (TOPROL-XL) 25 MG 24 hr tablet Take 25 mg by mouth at bedtime.      . Multiple Vitamins-Minerals (CENTRUM SILVER ADULT 50+) TABS Take 1 tablet by mouth daily.      . Multiple Vitamins-Minerals (OCUVITE PO) Take 1 tablet by mouth daily as needed (for vitamin - for eyes).       . mupirocin ointment (BACTROBAN) 2 % Place 1 application into the nose 2 (two) times daily.  22 g  0  . nitroGLYCERIN (NITROSTAT) 0.4 MG SL tablet Place 1 tablet (0.4 mg  total) under the tongue every 5 (five) minutes as needed for chest pain (up to 3 doses).  25 tablet  3  . promethazine (PHENERGAN) 50 MG tablet Take 1 tablet (50 mg total) by mouth every 6 (six) hours as needed for nausea.  30 tablet  1  . warfarin (COUMADIN) 5 MG tablet Take 5 mg by mouth as directed.       No current facility-administered medications for this visit.    Review of Systems  Constitutional: negative Eyes: negative Ears, nose, mouth, throat, and face: negative Respiratory: positive for dyspnea on exertion Cardiovascular: negative Gastrointestinal: negative Genitourinary:negative Integument/breast: negative Hematologic/lymphatic: negative Musculoskeletal:negative Neurological: negative Behavioral/Psych: negative Endocrine: negative Allergic/Immunologic: negative  Physical Exam  AVW:UJWJX, healthy, no distress, well nourished and well developed SKIN: skin color, texture, turgor  are normal, no rashes or significant lesions HEAD: Normocephalic, No masses, lesions, tenderness or abnormalities EYES: normal, PERRLA EARS: External ears normal, Canals clear OROPHARYNX:no exudate and no erythema  NECK: supple, no adenopathy, no JVD LYMPH:  no palpable lymphadenopathy, no hepatosplenomegaly BREAST:not examined LUNGS: clear to auscultation , and palpation HEART: regular rate & rhythm, no murmurs and no gallops ABDOMEN:abdomen soft, non-tender, obese, normal bowel sounds and no masses or organomegaly BACK: Back symmetric, no curvature., No CVA tenderness EXTREMITIES:1+ edema LLE  NEURO: alert & oriented x 3 with fluent speech, no focal motor/sensory deficits  PERFORMANCE STATUS: ECOG 1  LABORATORY DATA: Lab Results  Component Value Date   WBC 8.1 09/22/2013   HGB 10.0* 09/22/2013   HCT 30.2* 09/22/2013   MCV 82.1 09/22/2013   PLT 323 09/22/2013      Chemistry      Component Value Date/Time   NA 138 09/22/2013 0615   K 4.1 09/22/2013 0615   CL 98 09/22/2013 0615    CO2 30 09/22/2013 0615   BUN 13 09/22/2013 0615   CREATININE 0.71 09/22/2013 0615   CREATININE 0.83 05/21/2012 1150   GLU 173 11/08/2010      Component Value Date/Time   CALCIUM 8.8 09/22/2013 0615   ALKPHOS 70 10/08/2013 1015   AST 25 10/08/2013 1015   ALT 20 10/08/2013 1015   BILITOT 0.7 10/08/2013 1015       RADIOGRAPHIC STUDIES: US Venous Img Lower Bilateral  11/26/2013   CLINICAL DATA:  Pulmonary embolus diagnosed on 09/17/2013.  EXAM: Bilateral LOWER EXTREMITY VENOUS DOPPLER ULTRASOUND  TECHNIQUE: Gray-scale sonography with graded compression, as well as color Doppler and duplex ultrasound, were performed to evaluate the deep venous system from the level of the common femoral vein through the popliteal and proximal calf veins. Spectral Doppler was utilized to evaluate flow at rest and with distal augmentation maneuvers.  COMPARISON:  03/08/2010  FINDINGS: Thrombus within deep veins: No deep vein thrombosis, but there is thrombus in the noncompressible left greater saphenous vein.  Compressibility of deep veins: Deep vein compressibility normal. However, there is noncompressible thrombus in the left greater saphenous vein.  Duplex waveform respiratory phasicity:  Normal.  Duplex waveform response to augmentation:  Normal.  Venous reflux:  None visualized.  Other findings: Compressible varicosities in the subcutaneous tissues bilaterally.  IMPRESSION: 1. Although there is no deep vein thrombosis, there is thrombosis of the left greater saphenous vein. This can propagate to the deep venous system and may warrant treatment/follow-up. 2. Compressible subcutaneous venous varicosities.   Electronically Signed   By: Herbie Baltimore M.D.   On: 11/26/2013 15:05    ASSESSMENT: This is a very pleasant 67 years old white female with history of deep venous thrombosis and bilateral pulmonary embolus after left knee replacement. Her previous hypercoagulable panel was unremarkable except for low protein S  activity which most likely the result of her recent thrombosis rather than an etiology for it. She has been on treatment with Coumadin for the last 2 months but the majority of time her INR was subtherapeutic. The patient has evidence for thrombosis of the left great saphenous vein but no deep venous thrombosis.  PLAN: I have a lengthy discussion with the patient and her husband today about her current disease condition and treatment options. It looks like the patient has rough time adjusting her Coumadin dose to the appropriate therapeutic INR. I discussed with the patient consideration of switching her anticoagulation from Coumadin to Xarelto.  This  will be given for a dose of 15 mg by mouth twice a day for 3 weeks followed by 20 mg by mouth daily. Because of her history of extensive bilateral pulmonary emboli, I would recommend for the patient treatment for a total of 12 months. The patient is considering this treatment options but she would like to discuss this with Dr. Marchelle Gearing. I do think the patient her anticoagulation with Coumadin to require IVC filter placement at this point, but switching her to Xarelto may be a better option. I discussed with the patient adverse effect of Xarelto including risk of bleeding.  She will followup with Dr. Marchelle Gearing for further evaluation. She was advised to call me if she has any concerning symptoms but I would see the patient on as-needed basis at this point.  The patient voices understanding of current disease status and treatment options and is in agreement with the current care plan.  All questions were answered. The patient knows to call the clinic with any problems, questions or concerns. We can certainly see the patient much sooner if necessary.  Thank you so much for allowing me to participate in the care of [name]. I will continue to follow up the patient with you and assist in her care.  I spent 40 minutes counseling the patient face  to face. The total time spent in the appointment was 55 minutes.  Ezekiah Massie K. 12/02/2013, 7:42 PM

## 2013-12-02 NOTE — Patient Instructions (Signed)
Followup visit with Dr. Marchelle Gearing for discussion of switching treatment to Xarelto.

## 2013-12-02 NOTE — Progress Notes (Signed)
Checked in new patient with no financial issues. She appt card and has not been to [place].

## 2013-12-03 ENCOUNTER — Ambulatory Visit (INDEPENDENT_AMBULATORY_CARE_PROVIDER_SITE_OTHER): Payer: Medicare Other | Admitting: Emergency Medicine

## 2013-12-03 VITALS — BP 116/76 | HR 88 | Temp 97.8°F | Resp 16 | Ht 62.5 in | Wt 192.8 lb

## 2013-12-03 DIAGNOSIS — M79605 Pain in left leg: Secondary | ICD-10-CM

## 2013-12-03 DIAGNOSIS — I82812 Embolism and thrombosis of superficial veins of left lower extremities: Secondary | ICD-10-CM

## 2013-12-03 DIAGNOSIS — I82819 Embolism and thrombosis of superficial veins of unspecified lower extremities: Secondary | ICD-10-CM

## 2013-12-03 DIAGNOSIS — I2699 Other pulmonary embolism without acute cor pulmonale: Secondary | ICD-10-CM

## 2013-12-03 DIAGNOSIS — Z5181 Encounter for therapeutic drug level monitoring: Secondary | ICD-10-CM

## 2013-12-03 DIAGNOSIS — M79609 Pain in unspecified limb: Secondary | ICD-10-CM

## 2013-12-03 LAB — PROTIME-INR
INR: 2.52 — ABNORMAL HIGH (ref <1.50)
Prothrombin Time: 26.5 seconds — ABNORMAL HIGH (ref 11.6–15.2)

## 2013-12-03 NOTE — Progress Notes (Signed)
Subjective:    Patient ID: [name], female    DOB: 1946/07/04, 67 y.o.   MRN: [id number]  This chart was scribed for Sue Chris, MD by Blanchard Kelch, ED Scribe. The patient was seen in room 14. Patient's care was started at 8:51 AM.   HPI  [name] is a 67 y.o. female who presents to office for a follow up from an office visit with me on 12/10. She was seen by me on 11/26/13 after being discharged from the hospital for a total knee arthoplasty. While in the hospital she had a DVT in hospital and is currently on Coumadin. A doppler ultrasound performed last week showed the clot was in the left saphenous vein. She denies any pain in her left leg currently and is feeling much better. She has not yet started back on physical therapy.   She was seen by a doctor at [facility] after her appointment with me on 12/10 but denies that any blood work was done. She states that he read her prior notes and told her the best plan of action would be to switch to a Xarelto. However, she would like to remain on Coumadin.    Past Medical History  Diagnosis Date  . Neurofibromatosis   . Diabetes mellitus   . HTN (hypertension)   . Obesity   . Osteoarthritis, knee   . Hyperlipidemia     statin intolerant. LDL is 83  . UTI (lower urinary tract infection) 05/29/12  . STEMI (ST elevation myocardial infarction)     Inferolateral STEMI 05/29/12 s/p DES to RCA, nl EF   Past Surgical History  Procedure Laterality Date  . Cardiac catheterization    . Tonsillectomy and adenoidectomy    . Clavicle surgery    . Abdominal hysterectomy    . Ankle fracture surgery Right   . Total knee arthroplasty Left 09/15/2013    Procedure: TOTAL KNEE ARTHROPLASTY;  Surgeon: Dannielle Huh, MD;  Location: [facility] OR;  Service: Orthopedics;  Laterality: Left;   Family History  Problem Relation Age of Onset  . Heart disease Mother   . Arthritis Mother   . Stroke Mother   . Heart disease Son    Current Outpatient  Prescriptions on File Prior to Visit  Medication Sig Dispense Refill  . aspirin 81 MG tablet Take 1 tablet (81 mg total) by mouth daily.      . Calcium Carbonate-Vitamin D (CALCIUM + D PO) Take 1 tablet by mouth daily.       . colesevelam (WELCHOL) 625 MG tablet Take 1 tablet (625 mg total) by mouth 2 (two) times daily with a meal.  60 tablet  5  . fish oil-omega-3 fatty acids 1000 MG capsule Take 1 g by mouth daily.       Marland Kitchen glimepiride (AMARYL) 2 MG tablet TAKE 2 TABLETS BY MOUTH DAILY BEFORE BREAKFAST  60 tablet  6  . glucose blood test strip ONE TOUCH ULTRA TEST STRIPS, TEST DAILY DIAGNOSIS 250.02  100 each  12  . guaiFENesin (MUCINEX) 600 MG 12 hr tablet Take 600 mg by mouth 2 (two) times daily. PRN      . losartan (COZAAR) 25 MG tablet Take 25 mg by mouth daily.      . metFORMIN (GLUCOPHAGE) 1000 MG tablet Take 1 tablet (1,000 mg total) by mouth 2 (two) times daily.  180 tablet  3  . metoprolol succinate (TOPROL-XL) 25 MG 24 hr tablet Take 25 mg by  mouth at bedtime.      . Multiple Vitamins-Minerals (CENTRUM SILVER ADULT 50+) TABS Take 1 tablet by mouth daily.      . Multiple Vitamins-Minerals (OCUVITE PO) Take 1 tablet by mouth daily as needed (for vitamin - for eyes).       . mupirocin ointment (BACTROBAN) 2 % Place 1 application into the nose 2 (two) times daily.  22 g  0  . nitroGLYCERIN (NITROSTAT) 0.4 MG SL tablet Place 1 tablet (0.4 mg total) under the tongue every 5 (five) minutes as needed for chest pain (up to 3 doses).  25 tablet  3  . promethazine (PHENERGAN) 50 MG tablet Take 1 tablet (50 mg total) by mouth every 6 (six) hours as needed for nausea.  30 tablet  1  . warfarin (COUMADIN) 5 MG tablet Take 5 mg by mouth as directed.       No current facility-administered medications on file prior to visit.     Review of Systems  Constitutional: Negative for fever.  HENT: Negative for congestion.   Eyes: Negative for discharge.  Respiratory: Negative for cough.     Gastrointestinal: Negative for vomiting.  Genitourinary: Negative for hematuria.  Musculoskeletal: Negative for myalgias.  Skin: Negative for rash and wound.  Neurological: Negative for speech difficulty.  Psychiatric/Behavioral: Negative for confusion.       Objective:   Physical Exam  Nursing note and vitals reviewed. General: Well-developed, well-nourished female in no acute distress; appearance consistent with age of record HENT: normocephalic; atraumatic Eyes: pupils equal, round and reactive to light; extraocular muscles intact Neck: supple Heart: regular rate and rhythm; no murmurs, rubs or gallops Lungs: clear to auscultation bilaterally; no respiratory distress Abdomen: soft; nondistended; nontender; no masses or hepatosplenomegaly; bowel sounds present Extremities: No deformity; full range of motion; pulses normal the wound between her fourth and fifth toes of the right foot is healing. There is no tenderness along the left greater saphenous where she was found to have a clot by Doppler to Neurologic: Awake, alert and oriented; motor function intact in all extremities and symmetric; no facial droop Skin: Warm and dry Psychiatric: Normal mood and affect        Assessment & Plan:   Patient is doing great. We'll check a PT/INR today. I suggested she stay on Coumadin. We'll do a Doppler study of the left leg on Monday to be sure there is no propagation of the clot in her left greater saphenous.  I personally performed the services described in this documentation, which was scribed in my presence. The recorded information has been reviewed and is accurate.

## 2013-12-04 ENCOUNTER — Other Ambulatory Visit: Payer: Self-pay | Admitting: Emergency Medicine

## 2013-12-04 ENCOUNTER — Telehealth: Payer: Self-pay | Admitting: Radiology

## 2013-12-04 DIAGNOSIS — I809 Phlebitis and thrombophlebitis of unspecified site: Secondary | ICD-10-CM

## 2013-12-04 DIAGNOSIS — I82812 Embolism and thrombosis of superficial veins of left lower extremities: Secondary | ICD-10-CM

## 2013-12-04 NOTE — Telephone Encounter (Signed)
Spoke to patient about the [name]. Previous was at [facility] imaging, this one should be also, so it can be compared . Have re ordered the study.

## 2013-12-04 NOTE — Addendum Note (Signed)
Addended byCaffie Damme on: 12/04/2013 03:32 PM   Modules accepted: Orders

## 2013-12-05 ENCOUNTER — Telehealth: Payer: Self-pay

## 2013-12-05 NOTE — Telephone Encounter (Signed)
Patient called saying she got a reminder call from our office about her upcoming appt for her and appt for her husband. Wants [name] to know that they will be here for their scheduled appts next week. She insisted I let someone know.

## 2013-12-08 ENCOUNTER — Telehealth: Payer: Self-pay | Admitting: Radiology

## 2013-12-08 ENCOUNTER — Ambulatory Visit
Admission: RE | Admit: 2013-12-08 | Discharge: 2013-12-08 | Disposition: A | Discharge status: Home / Self Care | Payer: Medicare Other | Source: Ambulatory Visit | Attending: Emergency Medicine | Admitting: Emergency Medicine

## 2013-12-08 DIAGNOSIS — I82812 Embolism and thrombosis of superficial veins of left lower extremities: Secondary | ICD-10-CM

## 2013-12-08 NOTE — Telephone Encounter (Signed)
Spoke to [facility] imaging regarding the [name]. Verified ordered study is correct.

## 2013-12-09 ENCOUNTER — Encounter: Payer: Self-pay | Admitting: Emergency Medicine

## 2013-12-09 ENCOUNTER — Ambulatory Visit (INDEPENDENT_AMBULATORY_CARE_PROVIDER_SITE_OTHER): Payer: Medicare Other | Admitting: Emergency Medicine

## 2013-12-09 VITALS — BP 131/74 | HR 94 | Temp 97.9°F | Resp 16 | Ht 63.0 in | Wt 192.0 lb

## 2013-12-09 DIAGNOSIS — I10 Essential (primary) hypertension: Secondary | ICD-10-CM

## 2013-12-09 DIAGNOSIS — D649 Anemia, unspecified: Secondary | ICD-10-CM

## 2013-12-09 DIAGNOSIS — E119 Type 2 diabetes mellitus without complications: Secondary | ICD-10-CM

## 2013-12-09 DIAGNOSIS — Z0000 Encounter for general adult medical examination without abnormal findings: Secondary | ICD-10-CM

## 2013-12-09 LAB — COMPLETE METABOLIC PANEL WITH GFR
AST: 20 U/L (ref 0–37)
Alkaline Phosphatase: 62 U/L (ref 39–117)
CO2: 27 mEq/L (ref 19–32)
Creat: 0.81 mg/dL (ref 0.50–1.10)
GFR, Est Non African American: 76 mL/min
Glucose, Bld: 147 mg/dL — ABNORMAL HIGH (ref 70–99)
Sodium: 138 mEq/L (ref 135–145)
Total Bilirubin: 0.4 mg/dL (ref 0.3–1.2)
Total Protein: 7.7 g/dL (ref 6.0–8.3)

## 2013-12-09 LAB — CBC WITH DIFFERENTIAL/PLATELET
Basophils Absolute: 0 10*3/uL (ref 0.0–0.1)
Basophils Relative: 1 % (ref 0–1)
Eosinophils Absolute: 0.1 10*3/uL (ref 0.0–0.7)
Hemoglobin: 10.2 g/dL — ABNORMAL LOW (ref 12.0–15.0)
MCH: 23.7 pg — ABNORMAL LOW (ref 26.0–34.0)
MCHC: 32.3 g/dL (ref 30.0–36.0)
Monocytes Absolute: 0.7 10*3/uL (ref 0.1–1.0)
Monocytes Relative: 11 % (ref 3–12)
Neutro Abs: 3.8 10*3/uL (ref 1.7–7.7)
Neutrophils Relative %: 57 % (ref 43–77)
Platelets: 457 10*3/uL — ABNORMAL HIGH (ref 150–400)
RBC: 4.3 MIL/uL (ref 3.87–5.11)
RDW: 15.9 % — ABNORMAL HIGH (ref 11.5–15.5)

## 2013-12-09 LAB — TSH: TSH: 0.725 u[IU]/mL (ref 0.350–4.500)

## 2013-12-09 LAB — LIPID PANEL
Cholesterol: 165 mg/dL (ref 0–200)
HDL: 44 mg/dL (ref >39)
Total CHOL/HDL Ratio: 3.8 Ratio
Triglycerides: 90 mg/dL (ref <150)

## 2013-12-09 LAB — FERRITIN: Ferritin: 7 ng/mL — ABNORMAL LOW (ref 10–291)

## 2013-12-09 NOTE — Progress Notes (Signed)
@[email or web address] @  Patient ID: [name] MRN: [id number], DOB: [birth year], 67 y.o. Date of Encounter: 12/09/2013, 10:50 AM  Primary Physician: Lucilla Edin, MD  Chief Complaint: Physical (CPE)  HPI: 67 y.o. y/o female with history of noted below here for CPE.  Doing well. No issues/complaints.  LMP:  Pap: MMG: Review of Systems:  Consitutional: No fever, chills, fatigue, night sweats, lymphadenopathy, or weight changes. Eyes: No visual changes, eye redness, or discharge. ENT/Mouth: Ears: No otalgia, tinnitus, hearing loss, discharge. Nose: No congestion, rhinorrhea, sinus pain, or epistaxis. Throat: No sore throat, post nasal drip, or teeth pain. Cardiovascular: No CP, palpitations, diaphoresis, DOE, edema, orthopnea, PND. Respiratory: Patient had a PE in the hospital when she had her knee replacement. She is followed by Dr. Marchelle Gearing for this. Gastrointestinal: No anorexia, dysphagia, reflux, pain, nausea, vomiting, hematemesis, diarrhea, constipation, BRBPR, or melena. She is up-to-date on colonoscopy Breast: No discharge, pain, swelling, or mass she saw Dr. Marina Gravel yesterday for her GYN check.. Genitourinary: No dysuria, frequency, urgency, hematuria, incontinence, nocturia, amenorrhea, vaginal discharge, pruritis, burning, abnormal bleeding, or pain. Musculoskeletal left knee replacement was done earlier this month and is improving. She suffers from any clot in the left greater saphenous and her PT has been on hold recently  Skin: No rash, erythema, lesion changes, pain, warmth, jaundice, or pruritis. Has a history of neurofibromatosis with caf au lait spot Neurological: No headache, dizziness, syncope, seizures, tremors, memory loss, coordination problems, or paresthesias. Psychological: No anxiety, depression, hallucinations, SI/HI. Endocrine: No fatigue, polydipsia, polyphagia, polyuria, or known diabetes. All other systems were reviewed and are otherwise negative.  Past  Medical History  Diagnosis Date  . Neurofibromatosis   . Diabetes mellitus   . HTN (hypertension)   . Obesity   . Osteoarthritis, knee   . Hyperlipidemia     statin intolerant. LDL is 83  . UTI (lower urinary tract infection) 05/29/12  . 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 Pulmonary embolism [id number]     Past Surgical History  Procedure Laterality Date  . Cardiac catheterization    . Tonsillectomy and adenoidectomy    . Clavicle surgery    . Abdominal hysterectomy    . Ankle fracture surgery Right   . Total knee arthroplasty Left 09/15/2013    Procedure: TOTAL KNEE ARTHROPLASTY;  Surgeon: Dannielle Huh, MD;  Location: [facility] OR;  Service: Orthopedics;  Laterality: Left;    Home Meds:  Prior to Admission medications   Medication Sig Start Date End Date Taking? Authorizing Provider  aspirin 81 MG tablet Take 1 tablet (81 mg total) by mouth daily. 06/01/12  Yes Jessica A Hope, PA-C  Calcium Carbonate-Vitamin D (CALCIUM + D PO) Take 1 tablet by mouth daily.    Yes Historical Provider, MD  ciprofloxacin (CIPRO) 500 MG tablet Take 500 mg by mouth 2 (two) times daily.   Yes Historical Provider, MD  fish oil-omega-3 fatty acids 1000 MG capsule Take 1 g by mouth daily.    Yes Historical Provider, MD  glimepiride (AMARYL) 2 MG tablet TAKE 2 TABLETS BY MOUTH DAILY BEFORE BREAKFAST 09/12/13  Yes Cassell Clement, MD  glucose blood test strip ONE TOUCH ULTRA TEST STRIPS, TEST DAILY DIAGNOSIS 250.02 10/03/13  Yes Cassell Clement, MD  guaiFENesin (MUCINEX) 600 MG 12 hr tablet Take 600 mg by mouth 2 (two) times daily. PRN   Yes Historical Provider, MD  losartan (COZAAR) 25 MG tablet Take  25 mg by mouth daily.   Yes Historical Provider, MD  metFORMIN (GLUCOPHAGE) 1000 MG tablet Take 1 tablet (1,000 mg total) by mouth 2 (two) times daily. 07/14/13  Yes Cassell Clement, MD  metoprolol succinate (TOPROL-XL) 25 MG 24 hr tablet Take 25 mg by  mouth at bedtime.   Yes Historical Provider, MD  Multiple Vitamins-Minerals (CENTRUM SILVER ADULT 50+) TABS Take 1 tablet by mouth daily.   Yes Historical Provider, MD  Multiple Vitamins-Minerals (OCUVITE PO) Take 1 tablet by mouth daily as needed (for vitamin - for eyes).    Yes Historical Provider, MD  nitroGLYCERIN (NITROSTAT) 0.4 MG SL tablet Place 1 tablet (0.4 mg total) under the tongue every 5 (five) minutes as needed for chest pain (up to 3 doses). 07/01/13  Yes Cassell Clement, MD  promethazine (PHENERGAN) 50 MG tablet Take 1 tablet (50 mg total) by mouth every 6 (six) hours as needed for nausea. 09/16/13  Yes Altamese Cabal, PA-C  warfarin (COUMADIN) 5 MG tablet Take 5 mg by mouth as directed. 09/23/13  Yes Altamese Cabal, PA-C  colesevelam [facility]) 625 MG tablet Take 1 tablet (625 mg total) by mouth 2 (two) times daily with a meal. 10/14/13   Cassell Clement, MD  mupirocin ointment (BACTROBAN) 2 % Place 1 application into the nose 2 (two) times daily. 11/26/13   Collene Gobble, MD    Allergies:  Allergies  Allergen Reactions  . Amaryl Nausea Only  . Codeine Nausea And Vomiting  . Crestor [Rosuvastatin Calcium]   . Hydrocodone     Nausea and vomiting  . Lipitor [Atorvastatin Calcium]     MYALGIAS  . Lovastatin     MYALGIAS   . Niaspan [Niacin Er] Nausea Only  . Robaxin [Methocarbamol]     Made patient feel funny  . Sumycin [Tetracycline Hcl]   . Zetia [Ezetimibe]   . Zithromax [Azithromycin]   . Zocor [Simvastatin]     Muscle pain    History   Social History  . Marital Status: Married    Spouse Name: N/A    Number of Children: N/A  . Years of Education: N/A   Occupational History  . Not on file.   Social History Main Topics  . Smoking status: Never Smoker   . Smokeless tobacco: Never Used  . Alcohol Use: No  . Drug Use: No  . Sexual Activity: Not Currently   Other Topics Concern  . Not on file   Social History Narrative  . No narrative on file     Family History  Problem Relation Age of Onset  . Heart disease Mother   . Arthritis Mother   . Stroke Mother   . Heart disease Son     Physical Exam  Blood pressure 131/74, pulse 94, temperature 97.9 F (36.6 C), resp. rate 16, height 5\' 3"  (1.6 m), weight 192 lb (87.091 kg)., Body mass index is 34.02 kg/(m^2). General: Well developed, well nourished, in no acute distress. HEENT: Normocephalic, atraumatic. Conjunctiva pink, sclera non-icteric. Pupils 2 mm constricting to 1 mm, round, regular, and equally reactive to light and accomodation. EOMI. Internal auditory canal clear. TMs with good cone of light and without pathology. Nasal mucosa pink. Nares are without discharge. No sinus tenderness. Oral mucosa pink. Dentition. Pharynx without exudate.   Neck: Supple. Trachea midline. No thyromegaly. Full ROM. No lymphadenopathy. Lungs: Clear to auscultation bilaterally without wheezes, rales, or rhonchi. Breathing is of normal effort and unlabored. Cardiovascular: RRR with S1 S2. No murmurs,  rubs, or gallops appreciated. Distal pulses 2+ symmetrically. No carotid or abdominal bruits Breast: Deferred  Abdomen: Soft, non-tender, non-distended with normoactive bowel sounds. No hepatosplenomegaly or masses. No rebound/guarding. No CVA tenderness. Without hernias.  Genitourinary: Deferred  Musculoskeletal: Full range of motion and 5/5 strength throughout. Without swelling, atrophy, tenderness, crepitus, or warmth. Extremities without clubbing, cyanosis, or edema. Calves supple. There is a healing scar over the left knee she has decreased range of motion of the knee there is no redness of the thigh or the calf and Skin: Warm and moist without erythema, ecchymosis, wounds, or rash. Neuro: A+Ox3. CN II-XII grossly intact. Moves all extremities spontaneously. Full sensation throughout. Normal gait. DTR 2+ throughout upper and lower extremities. Finger to nose intact. Psych:  Responds to questions  appropriately with a normal affect.  HbA1c 6.9 Assessment/Plan:  67 y.o. y/o female here for CPE. She is doing well post left knee replacement complicated by pulmonary embolus. She was seen recently for a superficial phlebitis of the left leg ultrasound done yesterday showed improvement in the clot in her left greater saphenous. She continues to have her PT/INRs done with Dr. Marchelle Gearing . She is currently on Cipro for 3 days by Dr. Ambrose Mantle for a urinary tract infection.   -  Signed, Earl Lites, MD 12/09/2013 10:50 AM

## 2013-12-09 NOTE — Addendum Note (Signed)
Addended by: Mervin Kung on: 12/09/2013 11:14 AM   Modules accepted: Orders

## 2013-12-17 ENCOUNTER — Ambulatory Visit (INDEPENDENT_AMBULATORY_CARE_PROVIDER_SITE_OTHER): Payer: Medicare Other | Admitting: General Practice

## 2013-12-17 DIAGNOSIS — Z5181 Encounter for therapeutic drug level monitoring: Secondary | ICD-10-CM

## 2013-12-17 DIAGNOSIS — Z7901 Long term (current) use of anticoagulants: Secondary | ICD-10-CM

## 2013-12-17 DIAGNOSIS — I2699 Other pulmonary embolism without acute cor pulmonale: Secondary | ICD-10-CM

## 2013-12-17 LAB — POCT INR: INR: 2

## 2013-12-17 NOTE — Progress Notes (Signed)
Pre-visit discussion using our clinic review tool. No additional management support is needed unless otherwise documented below in the visit note.  

## 2013-12-21 ENCOUNTER — Ambulatory Visit (INDEPENDENT_AMBULATORY_CARE_PROVIDER_SITE_OTHER): Payer: Medicare Other | Admitting: Emergency Medicine

## 2013-12-21 VITALS — BP 122/72 | HR 101 | Temp 98.2°F | Resp 16 | Ht 63.0 in | Wt 191.6 lb

## 2013-12-21 DIAGNOSIS — R05 Cough: Secondary | ICD-10-CM

## 2013-12-21 DIAGNOSIS — J111 Influenza due to unidentified influenza virus with other respiratory manifestations: Secondary | ICD-10-CM

## 2013-12-21 DIAGNOSIS — R059 Cough, unspecified: Secondary | ICD-10-CM

## 2013-12-21 LAB — POCT INFLUENZA A/B
INFLUENZA A, POC: POSITIVE
Influenza B, POC: NEGATIVE

## 2013-12-21 MED ORDER — AMOXICILLIN 875 MG PO TABS: 875.0000 mg | ORAL_TABLET | Freq: Two times a day (BID) | ORAL | Status: DC

## 2013-12-21 NOTE — Patient Instructions (Signed)

## 2013-12-21 NOTE — Progress Notes (Signed)
   Subjective:    Patient ID: [name], female    DOB: 10/19/1946, 68 y.o.   MRN: [id number] This chart was scribed for Arlyss Queen, MD by Vernell Barrier, Medical Scribe. This patient's care was started at 11:43 AM.  HPI HPI Comments: [name] is a 68 y.o. female who presents to the [facility] complaining of body aches, sinus pressure w/ associated ear pain, cough, mild chest congestion, onset 3 days. Pt reports clear mucous when blowing her nose. Pt had potential sick contacts at home. Pt had a flu shot in 2014.  Review of Systems  HENT: Positive for congestion, ear pain and sinus pressure.   Respiratory: Positive for cough.   Musculoskeletal:       Body aches       Objective:   Physical Exam  CONSTITUTIONAL: Well developed/well nourished HEAD: Normocephalic/atraumatic EYES: EOMI/PERRL ENMT: Mucous membranes moist NECK: supple no meningeal signs SPINE:entire spine nontender CV: S1/S2 noted, no murmurs/rubs/gallops noted LUNGS: Lungs are clear to auscultation bilaterally, no apparent distress ABDOMEN: soft, nontender, no rebound or guarding GU:no cva tenderness NEURO: Pt is awake/alert, moves all extremitiesx4 EXTREMITIES: pulses normal, full ROM SKIN: warm, color normal PSYCH: no abnormalities of mood noted  Results for orders placed in visit on 12/21/13  POCT INFLUENZA A/B      Result Value Range   Influenza A, POC Positive     Influenza B, POC Negative          Assessment & Plan:   Patient tested positive for flu. I'm going to cover her with amoxicillin because of her history of recurrent bronchitis following viral type illnesses. I will forward a copy of this to Dr. Chase Caller for his records. She has an appointment to see him on Tuesday  I personally performed the services described in this documentation, which was scribed in my presence. The recorded information has been reviewed and is accurate.

## 2013-12-23 ENCOUNTER — Ambulatory Visit (INDEPENDENT_AMBULATORY_CARE_PROVIDER_SITE_OTHER): Payer: Medicare Other | Admitting: Internal Medicine

## 2013-12-23 ENCOUNTER — Encounter: Payer: Self-pay | Admitting: Internal Medicine

## 2013-12-23 VITALS — BP 136/82 | HR 101 | Ht 63.0 in | Wt 195.0 lb

## 2013-12-23 DIAGNOSIS — I2699 Other pulmonary embolism without acute cor pulmonale: Secondary | ICD-10-CM

## 2013-12-23 NOTE — Patient Instructions (Addendum)
Continue coumadin for INR Goal > 2 till end 2015 to complete 1 year treatment; after that we can look at coumadinw with INR goal > 1.5 but will have to ensure D-dimer is normal at that time Continue at coumadin clinic Monitor for bleeding risk while on coumadin REturn in 6 months or sooner if needed 

## 2013-12-23 NOTE — Progress Notes (Signed)
Subjective:    Patient ID: [name], female    DOB: 1946-04-29, 68 y.o.   MRN: [id number]  HPI  68 year old with a cardiac history, HTN and diabetes presenting to the hospital for left knee replacement. On 10/1 patient was found to be tachycardic and hypoxic. CTA was performed that showed a pulmonary embolism and PCCM was called on consultation. Patient denies previous blood clots or family history of blood clots. Has not been on blood thinners in the past. No hemoptysis or chest pain.  SIGNIFICANT EVENTS / STUDIES:  10/1 >>> CTA with PE.  2013-10-12 - duplex LE - negative DVT  October 12, 2013 - ECHO - normal RV  Pulmonary arteries: Systolic pressure was mildly to moderately increased. PA peak pressure: 3mm Hg (S    ASSESSMENT / PLAN:  68 year old female with no previous blood clot history, currently on lovenox DVT prophylaxis who developed a provoked PE. Patient was on ticagrelor prior to surgery for stent placement. Communicated with ortho, ok to start anti-coagulant. This is a first PE that is provoked by surgery.   09/22/13: improved and stable to go home. Meets criteria 5d IV heparin + INR > 2 x 24h.No bleed. Looks well. Will be off brilinta. However, pulse ox 88% at Rest on ROom Air. So need 2L oxygen  Plan:  -DC heparin infusion; home 2 after dc (dc;ed at 13.50 approximately)  -Coumadin clinic; will have my office arrage  - -After provoked (post-ortho procedure) VTE, usual recommended duration is 3-6 months with 6 mos preferred if no problems with anticoagulation  -There is no reason to suspect a hypercoag state but apparently a panel has been sent already so will follow up on this  - START 2L o2 will monitor this at followup; but medicare refused   OV 10/01/13  Followup hospital followup. Recent pulmonary embolism provoked after orthopedic surgery despite Lovenox treatment. Other risk factors obesity  -Since discharge she is slowly gaining her physical conditioning. Chest  physical therapy working with her. She still feels fatigued. She still has dyspnea although this is improved. There no new complaints. Her INR is being checked. Today it was 5.7 and Coumadin is on hold. There no new complaints husband is with heer. She plans to start outpatient physical therapy next week.  - Walk test today on room air with a walker 185 x feet x 1 laps on RA: stopped  due to leg fatigue. NO desat below 93%  11/11/13 Follow up  6 week follow up PE - reports dyspnea is about the same since last ov.  Doing well. No hemoptysis, chest pain, orthopnea, or edema.  Going to the [facility] every 2 weeks. INR this am was 1.7. Coumadin was adjusted to reach goal 2-3 . Says no missed doses of coumadin.  DOE is improved .    OV 12/23/2013 FU PE  INR now consistently at goal > 2 and on stable regimen. Early Dec 2014 had LEf sidedSVT  Diagnosed by PMD with duplex. Saw Dr Julien Nordmann for 2nd opinion; recommended 1 year anticoagualation at minimum. Currently astymptomatic. Feels well. Has INR monitoring at [employer] through [name]. Wants to know how long anti coagulation and how to monitor for resolutioin   Review of Systems  Constitutional: Negative.   HENT: Negative.   Eyes: Negative.   Respiratory: Negative.   Cardiovascular: Negative.   Gastrointestinal: Negative.   Endocrine: Negative.   Genitourinary: Negative.   Musculoskeletal: Negative.   Skin: Negative.  Allergic/Immunologic: Negative.   Neurological: Negative.   Hematological: Negative.   Psychiatric/Behavioral: Negative.        Objective:   Physical Exam  Vitals reviewed. Constitutional: She is oriented to person, place, and time. She appears well-developed and well-nourished. No distress.  HENT:  Head: Normocephalic and atraumatic.  Right Ear: External ear normal.  Left Ear: External ear normal.  Mouth/Throat: Oropharynx is clear and moist. No oropharyngeal exudate.  Eyes: Conjunctivae and EOM are normal. Pupils are equal,  round, and reactive to light. Right eye exhibits no discharge. Left eye exhibits no discharge. No scleral icterus.  Neck: Normal range of motion. Neck supple. No JVD present. No tracheal deviation present. No thyromegaly present.  Cardiovascular: Normal rate, regular rhythm, normal heart sounds and intact distal pulses.  Exam reveals no gallop and no friction rub.   No murmur heard. Pulmonary/Chest: Effort normal and breath sounds normal. No respiratory distress. She has no wheezes. She has no rales. She exhibits no tenderness.  Abdominal: Soft. Bowel sounds are normal. She exhibits no distension and no mass. There is no tenderness. There is no rebound and no guarding.  Musculoskeletal: Normal range of motion. She exhibits no edema and no tenderness.  Lymphadenopathy:    She has no cervical adenopathy.  Neurological: She is alert and oriented to person, place, and time. She has normal reflexes. No cranial nerve deficit. She exhibits normal muscle tone. Coordination normal.  Skin: Skin is warm and dry. No rash noted. She is not diaphoretic. No erythema. No pallor.  Psychiatric: She has a normal mood and affect. Her behavior is normal. Judgment and thought content normal.          Assessment & Plan:

## 2013-12-24 ENCOUNTER — Other Ambulatory Visit: Payer: Self-pay | Admitting: Emergency Medicine

## 2013-12-24 DIAGNOSIS — Z1231 Encounter for screening mammogram for malignant neoplasm of breast: Secondary | ICD-10-CM

## 2013-12-24 NOTE — Assessment & Plan Note (Signed)
Continue coumadin for INR Goal > 2 till end 2015 to complete 1 year treatment; after that we can look at coumadinw with INR goal > 1.5 but will have to ensure D-dimer is normal at that time Continue at coumadin clinic Monitor for bleeding risk while on coumadin REturn in 6 months or sooner if needed

## 2013-12-31 ENCOUNTER — Encounter: Payer: Self-pay | Admitting: Cardiology

## 2013-12-31 ENCOUNTER — Ambulatory Visit (INDEPENDENT_AMBULATORY_CARE_PROVIDER_SITE_OTHER): Payer: Medicare Other | Admitting: Cardiology

## 2013-12-31 VITALS — BP 158/76 | HR 94 | Ht 63.0 in | Wt 194.0 lb

## 2013-12-31 DIAGNOSIS — I2510 Atherosclerotic heart disease of native coronary artery without angina pectoris: Secondary | ICD-10-CM

## 2013-12-31 DIAGNOSIS — I219 Acute myocardial infarction, unspecified: Secondary | ICD-10-CM

## 2013-12-31 DIAGNOSIS — I2699 Other pulmonary embolism without acute cor pulmonale: Secondary | ICD-10-CM

## 2013-12-31 DIAGNOSIS — IMO0001 Reserved for inherently not codable concepts without codable children: Secondary | ICD-10-CM

## 2013-12-31 DIAGNOSIS — D509 Iron deficiency anemia, unspecified: Secondary | ICD-10-CM | POA: Insufficient documentation

## 2013-12-31 DIAGNOSIS — I213 ST elevation (STEMI) myocardial infarction of unspecified site: Secondary | ICD-10-CM

## 2013-12-31 DIAGNOSIS — E1165 Type 2 diabetes mellitus with hyperglycemia: Secondary | ICD-10-CM

## 2013-12-31 DIAGNOSIS — I119 Hypertensive heart disease without heart failure: Secondary | ICD-10-CM

## 2013-12-31 NOTE — Assessment & Plan Note (Signed)
The patient has not been experiencing any recurrent chest pain or angina.  She remains on baby aspirin and warfarin.  She is no longer on Brilinta.

## 2013-12-31 NOTE — Assessment & Plan Note (Signed)
She remains on warfarin.  She has had no recurrent symptoms of pulmonary embolus.  She did have some recent superficial phlebitis of the left thigh.

## 2013-12-31 NOTE — Progress Notes (Signed)
[name] Date of Birth:  01-Jun-1946 [address] [place], [place]  [address] [phone]         Fax   [phone]  History of Present Illness: This pleasant 68 year old woman is seen for a scheduled followup office visit. She has a prior history of known ischemic heart disease. On 05/29/12 she presented with acute chest pain and had a STEMI and was taken to the Cath Lab by Dr. Irish Lack who placed a drug-eluting stent into an occluded right coronary artery. She was on dual antiplatelet therapy with Brilinta and aspirin for more than one year.  Her Brilinta was stopped in anticipation of recent left total knee surgery and she is continued on aspirin The patient also has a history of hypercholesterolemia, diabetes, and neurofibromatosis.  The patient underwent left total knee replacement by Dr. Ronnie Derby on 09/15/13.  2 days later she had acute dyspnea and was found to have pulmonary emboli and 09/17/13.  Venous duplex of her lower extremities was negative for deep vein thrombosis.  She was treated with Lovenox bridging and Coumadin.  Her Coumadin is being followed at the pulmonary Coumadin clinic and by Dr. Chase Caller. Since last visit she has been doing well.   Current Outpatient Prescriptions  Medication Sig Dispense Refill  . aspirin 81 MG tablet Take 1 tablet (81 mg total) by mouth daily.      . Calcium Carbonate-Vitamin D (CALCIUM + D PO) Take 1 tablet by mouth daily.       Marland Kitchen Dextromethorphan-Guaifenesin (TUSSIN DM PO) Take by mouth.      . fish oil-omega-3 fatty acids 1000 MG capsule Take 1 g by mouth daily.       Marland Kitchen glimepiride (AMARYL) 2 MG tablet TAKE 2 TABLETS BY MOUTH DAILY BEFORE BREAKFAST  60 tablet  6  . glucose blood test strip ONE TOUCH ULTRA TEST STRIPS, TEST DAILY DIAGNOSIS 250.02  100 each  12  . guaiFENesin (MUCINEX) 600 MG 12 hr tablet Take 600 mg by mouth 2 (two) times daily. PRN      . losartan (COZAAR) 25 MG tablet Take 25 mg by mouth daily.      .  metoprolol succinate (TOPROL-XL) 25 MG 24 hr tablet Take 25 mg by mouth at bedtime.      . Multiple Vitamins-Minerals (AIRBORNE PO) Take 1 capsule by mouth 3 (three) times daily.      . Multiple Vitamins-Minerals (CENTRUM SILVER ADULT 50+) TABS Take 1 tablet by mouth daily.      . Multiple Vitamins-Minerals (OCUVITE PO) Take 1 tablet by mouth daily as needed (for vitamin - for eyes).       . nitroGLYCERIN (NITROSTAT) 0.4 MG SL tablet Place 1 tablet (0.4 mg total) under the tongue every 5 (five) minutes as needed for chest pain (up to 3 doses).  25 tablet  3  . promethazine (PHENERGAN) 50 MG tablet Take 1 tablet (50 mg total) by mouth every 6 (six) hours as needed for nausea.  30 tablet  1  . warfarin (COUMADIN) 5 MG tablet Take 5 mg by mouth as directed.      . metFORMIN (GLUCOPHAGE) 1000 MG tablet Take 1 tablet (1,000 mg total) by mouth 2 (two) times daily.  180 tablet  3  . mupirocin ointment (BACTROBAN) 2 % Place 1 application into the nose 2 (two) times daily.  22 g  0   No current facility-administered medications for this visit.    Allergies  Allergen  Reactions  . Amaryl Nausea Only  . Codeine Nausea And Vomiting  . Crestor [Rosuvastatin Calcium]   . Hydrocodone     Nausea and vomiting  . Lipitor [Atorvastatin Calcium]     MYALGIAS  . Lovastatin     MYALGIAS   . Niaspan [Niacin Er] Nausea Only  . Robaxin [Methocarbamol]     Made patient feel funny  . Sumycin [Tetracycline Hcl]   . Zetia [Ezetimibe]   . Zithromax [Azithromycin]   . Zocor [Simvastatin]     Muscle pain    Patient Active Problem List   Diagnosis Date Noted  . Fatigue 03/10/2011    Priority: High  . Chest pain at rest 03/10/2011    Priority: High  . Acute sinusitis, unspecified 03/10/2011    Priority: High  . HTN (hypertension)     Priority: Medium  . Fe deficiency anemia 12/31/2013  . Long term (current) use of anticoagulants 09/24/2013  . Pulmonary embolism 09/18/2013  . Acute pulmonary embolism  09/17/2013  . Hypoxemia 09/17/2013  . Onychomycosis 04/29/2013  . Pain in joint, ankle and foot 04/29/2013  . Benign hypertensive heart disease without heart failure 10/15/2012  . STEMI (ST elevation myocardial infarction) 05/30/2012  . Dyspnea 11/15/2011  . Herpes zoster 07/17/2011  . Neurofibromatosis   . Type II or unspecified type diabetes mellitus without mention of complication, uncontrolled   . Obesity   . Osteoarthritis, knee     History  Smoking status  . Never Smoker   Smokeless tobacco  . Never Used    History  Alcohol Use No    Family History  Problem Relation Age of Onset  . Heart disease Mother   . Arthritis Mother   . Stroke Mother   . Heart disease Son     Review of Systems: Constitutional: no fever chills diaphoresis or fatigue or change in weight.  Head and neck: no hearing loss, no epistaxis, no photophobia or visual disturbance. Respiratory: No cough, shortness of breath or wheezing. Cardiovascular: No chest pain peripheral edema, palpitations. Gastrointestinal: No abdominal distention, no abdominal pain, no change in bowel habits hematochezia or melena. Genitourinary: No dysuria, no frequency, no urgency, no nocturia. Musculoskeletal:No arthralgias, no back pain, no gait disturbance or myalgias. Neurological: No dizziness, no headaches, no numbness, no seizures, no syncope, no weakness, no tremors. Hematologic: No lymphadenopathy, no easy bruising. Psychiatric: No confusion, no hallucinations, no sleep disturbance.    Physical Exam: Filed Vitals:   12/31/13 1537  BP: 158/76  Pulse: 94   the general appearance reveals a well-developed well-nourished woman in no distress.  She has the typical skin lesions of neurofibromatosis.The head and neck exam reveals pupils equal and reactive.  Extraocular movements are full.  There is no scleral icterus.  The mouth and pharynx are normal.  The neck is supple.  The carotids reveal no bruits.  The jugular  venous pressure is normal.  The  thyroid is not enlarged.  There is no lymphadenopathy.  The chest is clear to percussion and auscultation.  There are no rales or rhonchi.  Expansion of the chest is symmetrical.  The precordium is quiet.  The first heart sound is normal.  The second heart sound is physiologically split.  There is no murmur gallop rub or click.  There is no abnormal lift or heave.  The abdomen is soft and nontender.  The bowel sounds are normal.  The liver and spleen are not enlarged.  There are no abdominal masses.  There  are no abdominal bruits.  Extremities reveal good pedal pulses.  There is no phlebitis or edema.  There is no cyanosis or clubbing.  Strength is normal and symmetrical in all extremities.  There is no lateralizing weakness.  There are no sensory deficits.  The skin is warm and dry.  There is no rash.     Assessment / Plan: The patient is doing well.  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 Her sinus tachycardia should improve as her hemoglobin improves. She is to continue same medication and be rechecked in 4 months for followup office visit.

## 2013-12-31 NOTE — Assessment & Plan Note (Signed)
The patient had a recent physical.  Her hemoglobin A1c was down to 6.9, she is not having any hypoglycemic episodes.

## 2013-12-31 NOTE — Patient Instructions (Signed)
Your physician recommends that you continue on your current medications as directed. Please refer to the Current Medication list given to you today.  Your physician wants you to follow-up in: 4 month ov You will receive a reminder letter in the mail two months in advance. If you don't receive a letter, please call our office to schedule the follow-up appointment.  

## 2013-12-31 NOTE — Assessment & Plan Note (Signed)
The patient has a history of iron deficiency anemia presumably secondary to blood loss at the time of her knee replacement.  She is now on ferrous sulfate 325 one daily.  Her sinus tachycardia is probably secondary to her anemia.  She denies any hematochezia or vaginal bleeding.

## 2014-01-14 ENCOUNTER — Ambulatory Visit (INDEPENDENT_AMBULATORY_CARE_PROVIDER_SITE_OTHER): Payer: Medicare Other | Admitting: General Practice

## 2014-01-14 DIAGNOSIS — Z7901 Long term (current) use of anticoagulants: Secondary | ICD-10-CM

## 2014-01-14 DIAGNOSIS — Z5181 Encounter for therapeutic drug level monitoring: Secondary | ICD-10-CM

## 2014-01-14 DIAGNOSIS — I2699 Other pulmonary embolism without acute cor pulmonale: Secondary | ICD-10-CM

## 2014-01-14 LAB — POCT INR: INR: 2.3

## 2014-01-14 NOTE — Progress Notes (Signed)
Pre-visit discussion using our clinic review tool. No additional management support is needed unless otherwise documented below in the visit note.  

## 2014-01-16 ENCOUNTER — Other Ambulatory Visit: Payer: Self-pay | Admitting: General Practice

## 2014-01-16 MED ORDER — WARFARIN SODIUM 5 MG PO TABS: ORAL_TABLET | ORAL | Status: DC

## 2014-01-21 ENCOUNTER — Ambulatory Visit: Payer: Medicare Other

## 2014-02-11 ENCOUNTER — Ambulatory Visit: Payer: Medicare Other

## 2014-02-11 ENCOUNTER — Ambulatory Visit (INDEPENDENT_AMBULATORY_CARE_PROVIDER_SITE_OTHER): Payer: Medicare Other | Admitting: General Practice

## 2014-02-11 ENCOUNTER — Ambulatory Visit (INDEPENDENT_AMBULATORY_CARE_PROVIDER_SITE_OTHER): Payer: Medicare Other | Admitting: Emergency Medicine

## 2014-02-11 VITALS — BP 136/80 | HR 108 | Temp 97.7°F | Resp 16 | Ht 62.75 in | Wt 194.5 lb

## 2014-02-11 DIAGNOSIS — M549 Dorsalgia, unspecified: Secondary | ICD-10-CM

## 2014-02-11 DIAGNOSIS — M25552 Pain in left hip: Secondary | ICD-10-CM

## 2014-02-11 DIAGNOSIS — R350 Frequency of micturition: Secondary | ICD-10-CM

## 2014-02-11 DIAGNOSIS — M25559 Pain in unspecified hip: Secondary | ICD-10-CM

## 2014-02-11 DIAGNOSIS — Z5181 Encounter for therapeutic drug level monitoring: Secondary | ICD-10-CM

## 2014-02-11 DIAGNOSIS — Z7901 Long term (current) use of anticoagulants: Secondary | ICD-10-CM

## 2014-02-11 DIAGNOSIS — N390 Urinary tract infection, site not specified: Secondary | ICD-10-CM

## 2014-02-11 DIAGNOSIS — I2699 Other pulmonary embolism without acute cor pulmonale: Secondary | ICD-10-CM

## 2014-02-11 LAB — POCT UA - MICROSCOPIC ONLY
Casts, Ur, LPF, POC: NEGATIVE
Crystals, Ur, HPF, POC: NEGATIVE
Mucus, UA: POSITIVE
YEAST UA: NEGATIVE

## 2014-02-11 LAB — POCT URINALYSIS DIPSTICK
Bilirubin, UA: NEGATIVE
Glucose, UA: NEGATIVE
Ketones, UA: NEGATIVE
NITRITE UA: POSITIVE
PH UA: 5
Protein, UA: NEGATIVE
Spec Grav, UA: 1.02
Urobilinogen, UA: 0.2

## 2014-02-11 LAB — POCT INR: INR: 2.7

## 2014-02-11 MED ORDER — CEPHALEXIN 500 MG PO CAPS: 500.0000 mg | ORAL_CAPSULE | Freq: Two times a day (BID) | ORAL | Status: DC

## 2014-02-11 NOTE — Progress Notes (Addendum)
Subjective:    Patient ID: [name], female    DOB: [birth year], 68 y.o.   MRN: [id number] This chart was scribed for Darlyne Russian, MD by Anastasia Pall, ED Scribe. This patient was seen in room 14 and the patient's care was started at 9:44 AM.  Chief Complaint  Patient presents with  . Urinary Frequency    and pain to urinate x 1 week  . Hip Pain    left x 2 weeks   HPI [name] is a 68 y.o. female She reports constant, upper, left hip pain, onset 2 weeks ago.   She reports she has been walking in the house, but not excessively. She denies any trauma to the area, denies any rashes. She denies LE pain. She states the weather is probably exacerbating her pain. She denies currently attending PT. She denies dysuria.  She reports recent nasal congestion, sore throat, ear pain. She denies any other symptoms.    PCP - Jenny Reichmann, MD  Patient Active Problem List   Diagnosis Date Noted  . Encounter for therapeutic drug monitoring 01/14/2014  . Fe deficiency anemia 12/31/2013  . Long term (current) use of anticoagulants 09/24/2013  . Pulmonary embolism 09/18/2013  . Acute pulmonary embolism 09/17/2013  . Hypoxemia 09/17/2013  . Onychomycosis 04/29/2013  . Pain in joint, ankle and foot 04/29/2013  . Benign hypertensive heart disease without heart failure 10/15/2012  . STEMI (ST elevation myocardial infarction) 05/30/2012  . Dyspnea 11/15/2011  . Herpes zoster 07/17/2011  . Fatigue 03/10/2011  . Chest pain at rest 03/10/2011  . Acute sinusitis, unspecified 03/10/2011  . Neurofibromatosis   . Type II or unspecified type diabetes mellitus without mention of complication, uncontrolled   . HTN (hypertension)   . Obesity   . Osteoarthritis, knee    Past Medical History  Diagnosis Date  . Neurofibromatosis   . Diabetes mellitus   . HTN (hypertension)   . Obesity   . Osteoarthritis, knee   . Hyperlipidemia     statin intolerant. LDL is 83  . UTI (lower urinary  tract infection) 05/29/12  . 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 Pulmonary embolism [id number]   Past Surgical History  Procedure Laterality Date  . Cardiac catheterization    . Tonsillectomy and adenoidectomy    . Clavicle surgery    . Abdominal hysterectomy    . Ankle fracture surgery Right   . Total knee arthroplasty Left 09/15/2013    Procedure: TOTAL KNEE ARTHROPLASTY;  Surgeon: Vickey Huger, MD;  Location: [facility];  Service: Orthopedics;  Laterality: Left;   Allergies  Allergen Reactions  . Amaryl Nausea Only  . Codeine Nausea And Vomiting  . Crestor [Rosuvastatin Calcium]   . Hydrocodone     Nausea and vomiting  . Lipitor [Atorvastatin Calcium]     MYALGIAS  . Lovastatin     MYALGIAS   . Niaspan [Niacin Er] Nausea Only  . Robaxin [Methocarbamol]     Made patient feel funny  . Sumycin [Tetracycline Hcl]   . Zetia [Ezetimibe]   . Zithromax [Azithromycin]   . Zocor [Simvastatin]     Muscle pain   Prior to Admission medications   Medication Sig Start Date End Date Taking? Authorizing Provider  aspirin 81 MG tablet Take 1 tablet (81 mg total) by mouth daily. 06/01/12  Yes Jessica A Hope, PA-C  Calcium Carbonate-Vitamin D (CALCIUM +  D PO) Take 1 tablet by mouth daily.    Yes Historical Provider, MD  Dextromethorphan-Guaifenesin (TUSSIN DM PO) Take by mouth.   Yes Historical Provider, MD  glimepiride (AMARYL) 2 MG tablet TAKE 2 TABLETS BY MOUTH DAILY BEFORE BREAKFAST 09/12/13  Yes Darlin Coco, MD  glucose blood test strip ONE TOUCH ULTRA TEST STRIPS, TEST DAILY DIAGNOSIS 250.02 10/03/13  Yes Darlin Coco, MD  guaiFENesin (MUCINEX) 600 MG 12 hr tablet Take 600 mg by mouth 2 (two) times daily. PRN   Yes Historical Provider, MD  losartan (COZAAR) 25 MG tablet Take 25 mg by mouth daily.   Yes Historical Provider, MD  metFORMIN (GLUCOPHAGE) 1000 MG tablet Take 1 tablet (1,000 mg total) by mouth 2 (two) times  daily. 07/14/13  Yes Darlin Coco, MD  metoprolol succinate (TOPROL-XL) 25 MG 24 hr tablet Take 25 mg by mouth at bedtime.   Yes Historical Provider, MD  Multiple Vitamins-Minerals (AIRBORNE PO) Take 1 capsule by mouth 3 (three) times daily.   Yes Historical Provider, MD  Multiple Vitamins-Minerals (CENTRUM SILVER ADULT 50+) TABS Take 1 tablet by mouth daily.   Yes Historical Provider, MD  Multiple Vitamins-Minerals (OCUVITE PO) Take 1 tablet by mouth daily as needed (for vitamin - for eyes).    Yes Historical Provider, MD  nitroGLYCERIN (NITROSTAT) 0.4 MG SL tablet Place 1 tablet (0.4 mg total) under the tongue every 5 (five) minutes as needed for chest pain (up to 3 doses). 07/01/13  Yes Darlin Coco, MD  promethazine (PHENERGAN) 50 MG tablet Take 1 tablet (50 mg total) by mouth every 6 (six) hours as needed for nausea. 09/16/13  Yes Carlynn Spry, PA-C  warfarin (COUMADIN) 5 MG tablet Take as directed by anticoagulation clinic 01/16/14  Yes Brand Males, MD  fish oil-omega-3 fatty acids 1000 MG capsule Take 1 g by mouth daily.     Historical Provider, MD  mupirocin ointment (BACTROBAN) 2 % Place 1 application into the nose 2 (two) times daily. 11/26/13   Darlyne Russian, MD   Review of Systems  HENT: Positive for congestion, ear pain and sore throat.   Genitourinary: Negative for dysuria.  Musculoskeletal: Positive for arthralgias (left hip).      Objective:   Physical Exam CONSTITUTIONAL: Well developed/well nourished HEAD: Normocephalic/atraumatic EYES: EOMI/PERRL ENMT: Mucous membranes moist NECK: supple no meningeal signs SPINE:entire spine nontender CV: S1/S2 noted, no murmurs/rubs/gallops noted LUNGS: Lungs CTAB, no apparent distress ABDOMEN: soft, nontender, no rebound or guarding GU: no cva tenderness NEURO: Pt is awake/alert, moves all extremitiesx4 EXTREMITIES: pulses normal, full ROM. Tender L-5 and S1 on left. Healed scar over left knee. Good ROM of left hip.    SKIN: warm, color normal PSYCH: no abnormalities of mood noted  BP 136/80  Pulse 108  Temp(Src) 97.7 F (36.5 C) (Oral)  Resp 16  Ht 5' 2.75" (1.594 m)  Wt 194 lb 8 oz (88.225 kg)  BMI 34.72 kg/m2  SpO2 96%  Results for orders placed in visit on 02/11/14  POCT UA - MICROSCOPIC ONLY      Result Value Ref Range   WBC, Ur, HPF, POC TNTC     RBC, urine, microscopic TNTC     Bacteria, U Microscopic 2+     Mucus, UA positive     Epithelial cells, urine per micros 3-5     Crystals, Ur, HPF, POC neg     Casts, Ur, LPF, POC neg     Yeast, UA neg    POCT  URINALYSIS DIPSTICK      Result Value Ref Range   Color, UA yellow     Clarity, UA cloudy     Glucose, UA neg     Bilirubin, UA neg     Ketones, UA neg     Spec Grav, UA 1.020     Blood, UA moderate     pH, UA 5.0     Protein, UA neg     Urobilinogen, UA 0.2     Nitrite, UA positive     Leukocytes, UA small (1+)    [facility] reading (PRIMARY) by  Dr. Cleta Alberts there is a grade 1 spondylolisthesis L4-L5 with some degenerative changes on the left in the lower lumbar spine and hip films show only mild arthritic changes      Assessment & Plan:       I personally performed the services described in this documentation, which was scribed in my presence. The recorded information has been reviewed and is accurate.

## 2014-02-11 NOTE — Progress Notes (Signed)
Pre visit review using our clinic review tool, if applicable. No additional management support is needed unless otherwise documented below in the visit note. 

## 2014-02-12 ENCOUNTER — Telehealth: Payer: Self-pay

## 2014-02-12 NOTE — Telephone Encounter (Signed)
Spoke with patient states she took methocarbamol back in September.  She had her PT/INR done yesterday, INR was 2.7 Also, the Rx she was given for UTI will not interfere with INR per nurse at coumadin clinic

## 2014-02-13 LAB — URINE CULTURE: Colony Count: >=100000

## 2014-02-19 NOTE — Telephone Encounter (Signed)
Spoke with patient, her referral was made at the wrong Dr. Jeoffrey Massed office.  I will need to be made at Dr. Remo Lipps Lucy's office.   Thank you

## 2014-02-26 ENCOUNTER — Ambulatory Visit (INDEPENDENT_AMBULATORY_CARE_PROVIDER_SITE_OTHER): Payer: Medicare Other | Admitting: General Practice

## 2014-02-26 DIAGNOSIS — I2699 Other pulmonary embolism without acute cor pulmonale: Secondary | ICD-10-CM

## 2014-02-26 DIAGNOSIS — Z7901 Long term (current) use of anticoagulants: Secondary | ICD-10-CM

## 2014-02-26 LAB — POCT INR: INR: 4.5

## 2014-02-26 NOTE — Progress Notes (Signed)
Pre visit review using our clinic review tool, if applicable. No additional management support is needed unless otherwise documented below in the visit note. 

## 2014-03-05 NOTE — Telephone Encounter (Signed)
X-ray of left hip  [id number]

## 2014-03-11 ENCOUNTER — Ambulatory Visit (INDEPENDENT_AMBULATORY_CARE_PROVIDER_SITE_OTHER): Payer: Medicare Other | Admitting: General Practice

## 2014-03-11 DIAGNOSIS — I2699 Other pulmonary embolism without acute cor pulmonale: Secondary | ICD-10-CM

## 2014-03-11 DIAGNOSIS — Z7901 Long term (current) use of anticoagulants: Secondary | ICD-10-CM

## 2014-03-11 DIAGNOSIS — Z5181 Encounter for therapeutic drug level monitoring: Secondary | ICD-10-CM

## 2014-03-11 LAB — POCT INR: INR: 3.1

## 2014-03-11 NOTE — Progress Notes (Signed)
Pre visit review using our clinic review tool, if applicable. No additional management support is needed unless otherwise documented below in the visit note. 

## 2014-03-18 HISTORY — PX: BONE MARROW BIOPSY: SHX199

## 2014-03-21 ENCOUNTER — Encounter (HOSPITAL_COMMUNITY): Payer: Self-pay | Admitting: Emergency Medicine

## 2014-03-21 ENCOUNTER — Emergency Department (HOSPITAL_COMMUNITY)
Admission: EM | Admit: 2014-03-21 | Discharge: 2014-03-21 | Disposition: A | Discharge status: Home / Self Care | Payer: Medicare Other | Attending: Emergency Medicine | Admitting: Emergency Medicine

## 2014-03-21 DIAGNOSIS — Z792 Long term (current) use of antibiotics: Secondary | ICD-10-CM | POA: Insufficient documentation

## 2014-03-21 DIAGNOSIS — E119 Type 2 diabetes mellitus without complications: Secondary | ICD-10-CM | POA: Insufficient documentation

## 2014-03-21 DIAGNOSIS — I10 Essential (primary) hypertension: Secondary | ICD-10-CM | POA: Insufficient documentation

## 2014-03-21 DIAGNOSIS — I252 Old myocardial infarction: Secondary | ICD-10-CM | POA: Insufficient documentation

## 2014-03-21 DIAGNOSIS — M545 Low back pain, unspecified: Secondary | ICD-10-CM | POA: Insufficient documentation

## 2014-03-21 DIAGNOSIS — Z7982 Long term (current) use of aspirin: Secondary | ICD-10-CM | POA: Insufficient documentation

## 2014-03-21 DIAGNOSIS — M1710 Unilateral primary osteoarthritis, unspecified knee: Secondary | ICD-10-CM | POA: Insufficient documentation

## 2014-03-21 DIAGNOSIS — Z7901 Long term (current) use of anticoagulants: Secondary | ICD-10-CM | POA: Insufficient documentation

## 2014-03-21 DIAGNOSIS — Z86711 Personal history of pulmonary embolism: Secondary | ICD-10-CM | POA: Insufficient documentation

## 2014-03-21 DIAGNOSIS — IMO0002 Reserved for concepts with insufficient information to code with codable children: Secondary | ICD-10-CM | POA: Insufficient documentation

## 2014-03-21 DIAGNOSIS — Z79899 Other long term (current) drug therapy: Secondary | ICD-10-CM | POA: Insufficient documentation

## 2014-03-21 DIAGNOSIS — Z9889 Other specified postprocedural states: Secondary | ICD-10-CM | POA: Insufficient documentation

## 2014-03-21 DIAGNOSIS — Z87440 Personal history of urinary (tract) infections: Secondary | ICD-10-CM | POA: Insufficient documentation

## 2014-03-21 MED ORDER — HYDROMORPHONE HCL PF 1 MG/ML IJ SOLN
1.0000 mg | Freq: Once | INTRAMUSCULAR | Status: AC
  Administered 2014-03-21: 1 mg via INTRAMUSCULAR
  Filled 2014-03-21: qty 1

## 2014-03-21 NOTE — ED Notes (Signed)
Pt. Stated, I've had terrible back pain and Im suppose to have a MRI next week.  Im on blood thinners and makes it hard for me to take medications.  I can't even sleep.

## 2014-03-21 NOTE — ED Provider Notes (Signed)
CSN: [id number]     Arrival date & time 03/21/14  1421 History   First MD Initiated Contact with Patient 03/21/14 1517     Chief Complaint  Patient presents with  . Back Pain     (Consider location/radiation/quality/duration/timing/severity/associated sxs/prior Treatment) HPI  68 year old obese female with history of non-insulin-dependent diabetes, neurofibromatosis, hypertension, and prior PE currently on warfarin who presents complaining of back pain. Patient reports for the past 4 or 5 weeks she has had progressive worsening pain to her low back. Pain is described as a sharp and achy sensation primarily to the right buttock is which radiates back and forth to her low back. Pain is worsening with movement especially trying to get out of bed. She usually walks without any assistance but lately having to use a cane to assist. She has been seen by her doctor and receive x-ray recently that does not show any acute abnormalities. It was thought to be sciatica. However her orthopedic doctor, Dr. Ronnie Derby as schedule her for a L-spine MRI this coming Monday and because "he just wants to be safe". She otherwise denies fever, chills, abnormal weight changes, night sweats, myalgias, lightheadedness, dizziness, chest pain, shortness of breath, abdominal pain, dysuria, hematuria, urinary or bowel incontinence, Saddle paresthesia, or rash. She denies any recent trauma. No history of cancer or history of IV drug use. She was prescribed Ultram and Robaxin 2 days ago which provides some relief. She is here requesting for an MRI to be done sooner.  Past Medical History  Diagnosis Date  . Neurofibromatosis   . Diabetes mellitus   . HTN (hypertension)   . Obesity   . Osteoarthritis, knee   . Hyperlipidemia     statin intolerant. LDL is 83  . UTI (lower urinary tract infection) 05/29/12  . 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 Pulmonary embolism  [id number]   Past Surgical History  Procedure Laterality Date  . Cardiac catheterization    . Tonsillectomy and adenoidectomy    . Clavicle surgery    . Abdominal hysterectomy    . Ankle fracture surgery Right   . Total knee arthroplasty Left 09/15/2013    Procedure: TOTAL KNEE ARTHROPLASTY;  Surgeon: Vickey Huger, MD;  Location: [facility];  Service: Orthopedics;  Laterality: Left;   Family History  Problem Relation Age of Onset  . Heart disease Mother   . Arthritis Mother   . Stroke Mother   . Heart disease Son    History  Substance Use Topics  . Smoking status: Never Smoker   . Smokeless tobacco: Never Used  . Alcohol Use: No   OB History   Grav Para Term Preterm Abortions TAB SAB Ect Mult Living                 Review of Systems  All other systems reviewed and are negative.      Allergies  Amaryl; Codeine; Crestor; Hydrocodone; Lipitor; Lovastatin; Niaspan; Robaxin; Sumycin; Zetia; Zithromax; and Zocor  Home Medications   Current Outpatient Rx  Name  Route  Sig  Dispense  Refill  . aspirin 81 MG tablet   Oral   Take 1 tablet (81 mg total) by mouth daily.         . Calcium Carbonate-Vitamin D (CALCIUM + D PO)   Oral   Take 1 tablet by mouth daily.          . cephALEXin (KEFLEX)  500 MG capsule   Oral   Take 1 capsule (500 mg total) by mouth 2 (two) times daily.   20 capsule   0   . Dextromethorphan-Guaifenesin (TUSSIN DM PO)   Oral   Take by mouth.         . fish oil-omega-3 fatty acids 1000 MG capsule   Oral   Take 1 g by mouth daily.          Marland Kitchen glimepiride (AMARYL) 2 MG tablet      TAKE 2 TABLETS BY MOUTH DAILY BEFORE BREAKFAST   60 tablet   6   . glucose blood test strip      ONE TOUCH ULTRA TEST STRIPS, TEST DAILY DIAGNOSIS 250.02   100 each   12     Ultra II   . guaiFENesin (MUCINEX) 600 MG 12 hr tablet   Oral   Take 600 mg by mouth 2 (two) times daily. PRN         . losartan (COZAAR) 25 MG tablet   Oral   Take 25 mg by  mouth daily.         . metFORMIN (GLUCOPHAGE) 1000 MG tablet   Oral   Take 1 tablet (1,000 mg total) by mouth 2 (two) times daily.   180 tablet   3   . metoprolol succinate (TOPROL-XL) 25 MG 24 hr tablet   Oral   Take 25 mg by mouth at bedtime.         . Multiple Vitamins-Minerals (AIRBORNE PO)   Oral   Take 1 capsule by mouth 3 (three) times daily.         . Multiple Vitamins-Minerals (CENTRUM SILVER ADULT 50+) TABS   Oral   Take 1 tablet by mouth daily.         . Multiple Vitamins-Minerals (OCUVITE PO)   Oral   Take 1 tablet by mouth daily as needed (for vitamin - for eyes).          . mupirocin ointment (BACTROBAN) 2 %   Nasal   Place 1 application into the nose 2 (two) times daily.   22 g   0   . nitroGLYCERIN (NITROSTAT) 0.4 MG SL tablet   Sublingual   Place 1 tablet (0.4 mg total) under the tongue every 5 (five) minutes as needed for chest pain (up to 3 doses).   25 tablet   3   . promethazine (PHENERGAN) 50 MG tablet   Oral   Take 1 tablet (50 mg total) by mouth every 6 (six) hours as needed for nausea.   30 tablet   1   . warfarin (COUMADIN) 5 MG tablet      Take as directed by anticoagulation clinic   30 tablet   3     30 day    BP 149/84  Pulse 109  Temp(Src) 98.3 F (36.8 C) (Oral)  Resp 18  Ht 5\' 3"  (1.6 m)  Wt 190 lb (86.183 kg)  BMI 33.67 kg/m2  SpO2 95% Physical Exam  Nursing note and vitals reviewed. Constitutional: She is oriented to person, place, and time. She appears well-developed and well-nourished. No distress.  Morbidly obese, and in no apparent distress  HENT:  Head: Atraumatic.  Eyes: Conjunctivae are normal.  Neck: Neck supple.  Cardiovascular: Intact distal pulses.   Abdominal: Soft. There is no tenderness.  Genitourinary:  No CVA tenderness  Musculoskeletal: She exhibits tenderness (Tenderness to right lumbosacral region on palpation worsening with back  flexion and with ambulation.).  No significant  midline spine tenderness, crepitus, or step-off noted.    Neurological: She is alert and oriented to person, place, and time. She has normal reflexes.  Skin: No rash noted.  Psychiatric: She has a normal mood and affect.    ED Course  Procedures (including critical care time)  3:38 PM Patient here with progressive lumbosacral pain. No radiculopathy. Patient has no red flags. She does have significant comorbidity however she is afebrile and able to ambulate using a cane. I do not think patient is warranted an MRI emergently. I discussed this with Dr. Tawnya Crook and she agrees.    4:32 PM Patient  felt much better after receiving pain medication. Patient agrees to followup for her scheduled MRI on Monday. Return precautions discussed. Otherwise patient stable for discharge.  Labs Review Labs Reviewed - No data to display Imaging Review No results found.   EKG Interpretation None      MDM   Final diagnoses:  Lumbosacral pain    BP 151/81  Pulse 108  Temp(Src) 98.3 F (36.8 C) (Oral)  Resp 18  Ht 5\' 3"  (1.6 m)  Wt 190 lb (86.183 kg)  BMI 33.67 kg/m2  SpO2 95%      Domenic Moras, PA-C 03/21/14 1633

## 2014-03-21 NOTE — ED Notes (Addendum)
PT states she has had pain in lower back rad from L to R for ~6mo. States she has a scheduled MRI Monday and hx of mult PE's. On coumadin and was given therapy, but no meds b/c of interaction with coumadin until Thurs. Given robaxin and ultram this past Thurs which only reduces pain slightly. PT in wheelchair and states she cannot sit in the bed.

## 2014-03-21 NOTE — Discharge Instructions (Signed)
Please follow up on Monday for MRI test as previously scheduled.  Take your prescribed pain medication.    Lumbosacral Strain Lumbosacral strain is a strain of any of the parts that make up your lumbosacral vertebrae. Your lumbosacral vertebrae are the bones that make up the lower third of your backbone. Your lumbosacral vertebrae are held together by muscles and tough, fibrous tissue (ligaments).  CAUSES  A sudden blow to your back can cause lumbosacral strain. Also, anything that causes an excessive stretch of the muscles in the low back can cause this strain. This is typically seen when people exert themselves strenuously, fall, lift heavy objects, bend, or crouch repeatedly. RISK FACTORS  Physically demanding work.  Participation in pushing or pulling sports or sports that require sudden twist of the back (tennis, golf, baseball).  Weight lifting.  Excessive lower back curvature.  Forward-tilted pelvis.  Weak back or abdominal muscles or both.  Tight hamstrings. SIGNS AND SYMPTOMS  Lumbosacral strain may cause pain in the area of your injury or pain that moves (radiates) down your leg.  DIAGNOSIS Your health care provider can often diagnose lumbosacral strain through a physical exam. In some cases, you may need tests such as X-ray exams.  TREATMENT  Treatment for your lower back injury depends on many factors that your clinician will have to evaluate. However, most treatment will include the use of anti-inflammatory medicines. HOME CARE INSTRUCTIONS   Avoid hard physical activities (tennis, racquetball, waterskiing) if you are not in proper physical condition for it. This may aggravate or create problems.  If you have a back problem, avoid sports requiring sudden body movements. Swimming and walking are generally safer activities.  Maintain good posture.  Maintain a healthy weight.  For acute conditions, you may put ice on the injured area.  Put ice in a plastic  bag.  Place a towel between your skin and the bag.  Leave the ice on for 20 minutes, 2 3 times a day.  When the low back starts healing, stretching and strengthening exercises may be recommended. SEEK MEDICAL CARE IF:  Your back pain is getting worse.  You experience severe back pain not relieved with medicines. SEEK IMMEDIATE MEDICAL CARE IF:   You have numbness, tingling, weakness, or problems with the use of your arms or legs.  There is a change in bowel or bladder control.  You have increasing pain in any area of the body, including your belly (abdomen).  You notice shortness of breath, dizziness, or feel faint.  You feel sick to your stomach (nauseous), are throwing up (vomiting), or become sweaty.  You notice discoloration of your toes or legs, or your feet get very cold. MAKE SURE YOU:   Understand these instructions.  Will watch your condition.  Will get help right away if you are not doing well or get worse. Document Released: 09/13/2005 Document Revised: 09/24/2013 Document Reviewed: 07/23/2013 [facility] Patient Information 2014 [place], [place].

## 2014-03-22 NOTE — ED Provider Notes (Signed)
Medical screening examination/treatment/procedure(s) were performed by non-physician practitioner and as supervising physician I was immediately available for consultation/collaboration.   Neta Ehlers, MD 03/22/14 1037

## 2014-03-24 ENCOUNTER — Emergency Department (HOSPITAL_COMMUNITY): Payer: Medicare Other

## 2014-03-24 ENCOUNTER — Encounter (HOSPITAL_COMMUNITY): Payer: Self-pay | Admitting: Emergency Medicine

## 2014-03-24 ENCOUNTER — Other Ambulatory Visit: Payer: Medicare Other

## 2014-03-24 ENCOUNTER — Other Ambulatory Visit: Payer: Self-pay | Admitting: Orthopedic Surgery

## 2014-03-24 ENCOUNTER — Inpatient Hospital Stay (HOSPITAL_COMMUNITY)
Admission: EM | Admit: 2014-03-24 | Discharge: 2014-03-31 | DRG: 194 | Disposition: A | Discharge status: Rehabilitation Facility | Payer: Medicare Other | Attending: Family Medicine | Admitting: Family Medicine

## 2014-03-24 DIAGNOSIS — R0989 Other specified symptoms and signs involving the circulatory and respiratory systems: Secondary | ICD-10-CM

## 2014-03-24 DIAGNOSIS — IMO0002 Reserved for concepts with insufficient information to code with codable children: Secondary | ICD-10-CM

## 2014-03-24 DIAGNOSIS — M1710 Unilateral primary osteoarthritis, unspecified knee: Secondary | ICD-10-CM | POA: Diagnosis present

## 2014-03-24 DIAGNOSIS — IMO0001 Reserved for inherently not codable concepts without codable children: Secondary | ICD-10-CM

## 2014-03-24 DIAGNOSIS — N183 Chronic kidney disease, stage 3 (moderate): Secondary | ICD-10-CM

## 2014-03-24 DIAGNOSIS — R5381 Other malaise: Secondary | ICD-10-CM | POA: Diagnosis present

## 2014-03-24 DIAGNOSIS — I2699 Other pulmonary embolism without acute cor pulmonale: Secondary | ICD-10-CM

## 2014-03-24 DIAGNOSIS — Z6833 Body mass index (BMI) 33.0-33.9, adult: Secondary | ICD-10-CM

## 2014-03-24 DIAGNOSIS — N179 Acute kidney failure, unspecified: Secondary | ICD-10-CM | POA: Diagnosis present

## 2014-03-24 DIAGNOSIS — D509 Iron deficiency anemia, unspecified: Secondary | ICD-10-CM

## 2014-03-24 DIAGNOSIS — R000 Tachycardia, unspecified: Secondary | ICD-10-CM | POA: Diagnosis present

## 2014-03-24 DIAGNOSIS — Z96659 Presence of unspecified artificial knee joint: Secondary | ICD-10-CM

## 2014-03-24 DIAGNOSIS — J189 Pneumonia, unspecified organism: Principal | ICD-10-CM | POA: Diagnosis present

## 2014-03-24 DIAGNOSIS — E1122 Type 2 diabetes mellitus with diabetic chronic kidney disease: Secondary | ICD-10-CM | POA: Diagnosis present

## 2014-03-24 DIAGNOSIS — R0600 Dyspnea, unspecified: Secondary | ICD-10-CM

## 2014-03-24 DIAGNOSIS — C9000 Multiple myeloma not having achieved remission: Secondary | ICD-10-CM | POA: Diagnosis present

## 2014-03-24 DIAGNOSIS — I2782 Chronic pulmonary embolism: Secondary | ICD-10-CM | POA: Diagnosis present

## 2014-03-24 DIAGNOSIS — E1165 Type 2 diabetes mellitus with hyperglycemia: Secondary | ICD-10-CM

## 2014-03-24 DIAGNOSIS — M545 Low back pain, unspecified: Secondary | ICD-10-CM

## 2014-03-24 DIAGNOSIS — M810 Age-related osteoporosis without current pathological fracture: Secondary | ICD-10-CM | POA: Diagnosis present

## 2014-03-24 DIAGNOSIS — Z823 Family history of stroke: Secondary | ICD-10-CM

## 2014-03-24 DIAGNOSIS — T45515A Adverse effect of anticoagulants, initial encounter: Secondary | ICD-10-CM | POA: Diagnosis present

## 2014-03-24 DIAGNOSIS — S32009A Unspecified fracture of unspecified lumbar vertebra, initial encounter for closed fracture: Secondary | ICD-10-CM | POA: Diagnosis present

## 2014-03-24 DIAGNOSIS — R5383 Other fatigue: Secondary | ICD-10-CM

## 2014-03-24 DIAGNOSIS — E871 Hypo-osmolality and hyponatremia: Secondary | ICD-10-CM | POA: Diagnosis present

## 2014-03-24 DIAGNOSIS — Z8249 Family history of ischemic heart disease and other diseases of the circulatory system: Secondary | ICD-10-CM

## 2014-03-24 DIAGNOSIS — R791 Abnormal coagulation profile: Secondary | ICD-10-CM | POA: Diagnosis present

## 2014-03-24 DIAGNOSIS — R0902 Hypoxemia: Secondary | ICD-10-CM

## 2014-03-24 DIAGNOSIS — D62 Acute posthemorrhagic anemia: Secondary | ICD-10-CM

## 2014-03-24 DIAGNOSIS — Z86711 Personal history of pulmonary embolism: Secondary | ICD-10-CM

## 2014-03-24 DIAGNOSIS — E119 Type 2 diabetes mellitus without complications: Secondary | ICD-10-CM | POA: Diagnosis present

## 2014-03-24 DIAGNOSIS — D72829 Elevated white blood cell count, unspecified: Secondary | ICD-10-CM | POA: Diagnosis present

## 2014-03-24 DIAGNOSIS — I10 Essential (primary) hypertension: Secondary | ICD-10-CM | POA: Diagnosis present

## 2014-03-24 DIAGNOSIS — M899 Disorder of bone, unspecified: Secondary | ICD-10-CM | POA: Diagnosis present

## 2014-03-24 DIAGNOSIS — I119 Hypertensive heart disease without heart failure: Secondary | ICD-10-CM | POA: Diagnosis present

## 2014-03-24 DIAGNOSIS — E8809 Other disorders of plasma-protein metabolism, not elsewhere classified: Secondary | ICD-10-CM | POA: Diagnosis present

## 2014-03-24 DIAGNOSIS — I498 Other specified cardiac arrhythmias: Secondary | ICD-10-CM | POA: Diagnosis present

## 2014-03-24 DIAGNOSIS — R042 Hemoptysis: Secondary | ICD-10-CM

## 2014-03-24 DIAGNOSIS — E785 Hyperlipidemia, unspecified: Secondary | ICD-10-CM | POA: Diagnosis present

## 2014-03-24 DIAGNOSIS — Z7901 Long term (current) use of anticoagulants: Secondary | ICD-10-CM

## 2014-03-24 DIAGNOSIS — R0609 Other forms of dyspnea: Secondary | ICD-10-CM

## 2014-03-24 DIAGNOSIS — M949 Disorder of cartilage, unspecified: Secondary | ICD-10-CM

## 2014-03-24 DIAGNOSIS — I252 Old myocardial infarction: Secondary | ICD-10-CM

## 2014-03-24 DIAGNOSIS — I82509 Chronic embolism and thrombosis of unspecified deep veins of unspecified lower extremity: Secondary | ICD-10-CM | POA: Diagnosis present

## 2014-03-24 DIAGNOSIS — K5900 Constipation, unspecified: Secondary | ICD-10-CM | POA: Diagnosis not present

## 2014-03-24 DIAGNOSIS — M8448XA Pathological fracture, other site, initial encounter for fracture: Secondary | ICD-10-CM | POA: Diagnosis present

## 2014-03-24 DIAGNOSIS — D759 Disease of blood and blood-forming organs, unspecified: Secondary | ICD-10-CM

## 2014-03-24 DIAGNOSIS — I959 Hypotension, unspecified: Secondary | ICD-10-CM | POA: Diagnosis not present

## 2014-03-24 DIAGNOSIS — Z79899 Other long term (current) drug therapy: Secondary | ICD-10-CM

## 2014-03-24 LAB — URINE MICROSCOPIC-ADD ON

## 2014-03-24 LAB — CBC WITH DIFFERENTIAL/PLATELET
BASOS PCT: 1 % (ref 0–1)
Basophils Absolute: 0.1 10*3/uL (ref 0.0–0.1)
Eosinophils Absolute: 0.1 10*3/uL (ref 0.0–0.7)
Eosinophils Relative: 1 % (ref 0–5)
HEMATOCRIT: 23 % — AB (ref 36.0–46.0)
Hemoglobin: 7.4 g/dL — ABNORMAL LOW (ref 12.0–15.0)
Lymphocytes Relative: 19 % (ref 12–46)
Lymphs Abs: 2 10*3/uL (ref 0.7–4.0)
MCH: 25.4 pg — AB (ref 26.0–34.0)
MCHC: 32.2 g/dL (ref 30.0–36.0)
MCV: 79 fL (ref 78.0–100.0)
MONO ABS: 1.1 10*3/uL — AB (ref 0.1–1.0)
Monocytes Relative: 10 % (ref 3–12)
Neutro Abs: 7.3 10*3/uL (ref 1.7–7.7)
Neutrophils Relative %: 69 % (ref 43–77)
Platelets: 327 10*3/uL (ref 150–400)
RBC: 2.91 MIL/uL — ABNORMAL LOW (ref 3.87–5.11)
RDW: 19.8 % — AB (ref 11.5–15.5)
WBC: 10.6 10*3/uL — ABNORMAL HIGH (ref 4.0–10.5)

## 2014-03-24 LAB — URINALYSIS, ROUTINE W REFLEX MICROSCOPIC
BILIRUBIN URINE: NEGATIVE
Glucose, UA: NEGATIVE mg/dL
KETONES UR: NEGATIVE mg/dL
Leukocytes, UA: NEGATIVE
NITRITE: NEGATIVE
PH: 5 (ref 5.0–8.0)
PROTEIN: 30 mg/dL — AB
Specific Gravity, Urine: 1.028 (ref 1.005–1.030)
Urobilinogen, UA: 0.2 mg/dL (ref 0.0–1.0)

## 2014-03-24 LAB — COMPREHENSIVE METABOLIC PANEL
ALK PHOS: 70 U/L (ref 39–117)
ALT: 9 U/L (ref 0–35)
AST: 36 U/L (ref 0–37)
Albumin: 2.2 g/dL — ABNORMAL LOW (ref 3.5–5.2)
BUN: 28 mg/dL — ABNORMAL HIGH (ref 6–23)
CO2: 23 mEq/L (ref 19–32)
CREATININE: 1.14 mg/dL — AB (ref 0.50–1.10)
Calcium: 12.3 mg/dL — ABNORMAL HIGH (ref 8.4–10.5)
Chloride: 92 mEq/L — ABNORMAL LOW (ref 96–112)
GFR calc non Af Amer: 49 mL/min — ABNORMAL LOW (ref >90)
GFR, EST AFRICAN AMERICAN: 57 mL/min — AB (ref >90)
GLUCOSE: 111 mg/dL — AB (ref 70–99)
Potassium: 4.8 mEq/L (ref 3.7–5.3)
Sodium: 134 mEq/L — ABNORMAL LOW (ref 137–147)
Total Bilirubin: 0.4 mg/dL (ref 0.3–1.2)
Total Protein: 11.2 g/dL — ABNORMAL HIGH (ref 6.0–8.3)

## 2014-03-24 LAB — PROTIME-INR
INR: 6.04 — AB (ref 0.00–1.49)
PROTHROMBIN TIME: 51.3 s — AB (ref 11.6–15.2)

## 2014-03-24 LAB — PREPARE RBC (CROSSMATCH)

## 2014-03-24 MED ORDER — SODIUM CHLORIDE 0.9 % IV BOLUS (SEPSIS)
1000.0000 mL | Freq: Once | INTRAVENOUS | Status: AC
  Administered 2014-03-24: 1000 mL via INTRAVENOUS

## 2014-03-24 MED ORDER — HYDROMORPHONE HCL PF 1 MG/ML IJ SOLN
1.0000 mg | Freq: Once | INTRAMUSCULAR | Status: AC
Start: 2014-03-24 — End: 2014-03-24
  Administered 2014-03-24: 1 mg via INTRAVENOUS
  Filled 2014-03-24: qty 1

## 2014-03-24 MED ORDER — VANCOMYCIN HCL 10 G IV SOLR
1500.0000 mg | Freq: Once | INTRAVENOUS | Status: AC
  Administered 2014-03-24: 1500 mg via INTRAVENOUS
  Filled 2014-03-24: qty 1500

## 2014-03-24 MED ORDER — VANCOMYCIN HCL IN DEXTROSE 750-5 MG/150ML-% IV SOLN
750.0000 mg | Freq: Two times a day (BID) | INTRAVENOUS | Status: DC
  Administered 2014-03-25 (×2): 750 mg via INTRAVENOUS
  Filled 2014-03-24 (×4): qty 150

## 2014-03-24 MED ORDER — PIPERACILLIN-TAZOBACTAM 3.375 G IVPB 30 MIN
3.3750 g | Freq: Once | INTRAVENOUS | Status: AC
  Administered 2014-03-24: 3.375 g via INTRAVENOUS
  Filled 2014-03-24: qty 50

## 2014-03-24 MED ORDER — HYDROMORPHONE HCL PF 1 MG/ML IJ SOLN
1.0000 mg | Freq: Once | INTRAMUSCULAR | Status: AC
  Administered 2014-03-24: 1 mg via INTRAVENOUS
  Filled 2014-03-24: qty 1

## 2014-03-24 MED ORDER — VITAMIN K1 10 MG/ML IJ SOLN
10.0000 mg | Freq: Once | INTRAVENOUS | Status: AC
  Administered 2014-03-24: 10 mg via INTRAVENOUS
  Filled 2014-03-24: qty 1

## 2014-03-24 MED ORDER — PIPERACILLIN-TAZOBACTAM 3.375 G IVPB
3.3750 g | Freq: Three times a day (TID) | INTRAVENOUS | Status: DC
  Administered 2014-03-25 – 2014-03-26 (×4): 3.375 g via INTRAVENOUS
  Filled 2014-03-24 (×6): qty 50

## 2014-03-24 MED ORDER — IOHEXOL 350 MG/ML SOLN
100.0000 mL | Freq: Once | INTRAVENOUS | Status: AC | PRN
  Administered 2014-03-24: 100 mL via INTRAVENOUS

## 2014-03-24 NOTE — ED Notes (Signed)
Patient transported to X-ray 

## 2014-03-24 NOTE — ED Notes (Signed)
Verified with MD Brtalik that blood cultures were not needed before administering antibiotic.

## 2014-03-24 NOTE — ED Provider Notes (Signed)
CSN: [id number]     Arrival date & time 03/24/14  1401 History   First MD Initiated Contact with Patient 03/24/14 1823     Chief Complaint  Patient presents with  . Back Pain     (Consider location/radiation/quality/duration/timing/severity/associated sxs/prior Treatment) Patient is a 68 y.o. female presenting with general illness. The history is provided by the patient.  Illness Location:  2 complaints. 1. LBP. 2. Hemoptysis Quality:  Pain has been dull for 1-1.5 week, worsening. Hemoptysis is gross. Severity:  Moderate Onset quality:  Gradual Duration: LBP for 1.5 weeks, hemoptysis for 2 days. Timing:  Constant Progression:  Worsening Chronicity:  New Context:  Pt has had LBP for 1.5 weeks, states gradual, hurts midline and radiates around flank to front, no numbness or weakness, no b/b incontiennce, saw Orthopedist who wants MRI but feels sciatic. Pt on Coumadin for  hx of VTE dz and has been coughing for 2 days, no chest pain or SOB Relieved by:  None Worsened by:  None Ineffective treatments:  None tried Associated symptoms: cough   Associated symptoms: no abdominal pain, no chest pain, no congestion, no fever, no headaches, no loss of consciousness, no nausea, no rhinorrhea, no shortness of breath, no vomiting and no wheezing     Past Medical History  Diagnosis Date  . Neurofibromatosis   . Diabetes mellitus   . HTN (hypertension)   . Obesity   . Osteoarthritis, knee   . Hyperlipidemia     statin intolerant. LDL is 83  . UTI (lower urinary tract infection) 05/29/12  . 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 Pulmonary embolism [id number]   Past Surgical History  Procedure Laterality Date  . Cardiac catheterization    . Tonsillectomy and adenoidectomy    . Clavicle surgery    . Abdominal hysterectomy    . Ankle fracture surgery Right   . Total knee arthroplasty Left 09/15/2013    Procedure: TOTAL KNEE  ARTHROPLASTY;  Surgeon: Vickey Huger, MD;  Location: [place];  Service: Orthopedics;  Laterality: Left;   Family History  Problem Relation Age of Onset  . Heart disease Mother   . Arthritis Mother   . Stroke Mother   . Heart disease Son    History  Substance Use Topics  . Smoking status: Never Smoker   . Smokeless tobacco: Never Used  . Alcohol Use: No   OB History   Grav Para Term Preterm Abortions TAB SAB Ect Mult Living                 Review of Systems  Constitutional: Negative for fever, chills, activity change and appetite change.  HENT: Negative for congestion and rhinorrhea.   Eyes: Negative for discharge, redness and itching.  Respiratory: Positive for cough. Negative for shortness of breath and wheezing.   Cardiovascular: Negative for chest pain and palpitations.  Gastrointestinal: Negative for nausea, vomiting, abdominal pain, constipation and blood in stool.  Genitourinary: Negative for dysuria and decreased urine volume.  Musculoskeletal: Positive for back pain and gait problem (hurts to walk from her back pani).  Neurological: Negative for loss of consciousness, syncope, weakness and headaches.      Allergies  Codeine; Hydrocodone; Niaspan; Robaxin; Statins; Tetracycline; Zetia; and Zithromax  Home Medications   No current outpatient prescriptions on file. BP 122/53  Pulse 110  Temp(Src) 97.8 F (36.6 C) (Oral)  Resp 18  Ht 5\' 3"  (1.6  m)  Wt 190 lb (86.183 kg)  BMI 33.67 kg/m2  SpO2 98% Physical Exam  Constitutional: She is oriented to person, place, and time. She appears well-developed and well-nourished. No distress.  NAD, well appearing  HENT:  Head: Normocephalic and atraumatic.  Mouth/Throat: Oropharynx is clear and moist.  Eyes: Conjunctivae and EOM are normal. Pupils are equal, round, and reactive to light. Right eye exhibits no discharge. Left eye exhibits no discharge. No scleral icterus.  Neck: Normal range of motion. Neck supple.   Cardiovascular: Regular rhythm and intact distal pulses.  Exam reveals no gallop and no friction rub.   No murmur heard. Mildly tachy  Pulmonary/Chest: Effort normal. No respiratory distress. She has no wheezes. She has no rales.  Coughing during exam, with gross blood present Coarse b/l  Abdominal: Soft. She exhibits no distension and no mass. There is no tenderness.  Musculoskeletal: Normal range of motion.  Neurological: She is alert and oriented to person, place, and time. No cranial nerve deficit. She exhibits normal muscle tone. Coordination normal.  5/5 strength in all exts, normal sensation in alle xts, 2+ DTRs in patella and brachioradilias, ambulatory w/o ataxia, has diffuse upper Lumbar ttp on both sides   Skin: She is not diaphoretic.    ED Course  Procedures (including critical care time) Labs Review Labs Reviewed  URINALYSIS, ROUTINE W REFLEX MICROSCOPIC - Abnormal; Notable for the following:    Hgb urine dipstick LARGE (*)    Protein, ur 30 (*)    All other components within normal limits  CBC WITH DIFFERENTIAL - Abnormal; Notable for the following:    WBC 10.6 (*)    RBC 2.91 (*)    Hemoglobin 7.4 (*)    HCT 23.0 (*)    MCH 25.4 (*)    RDW 19.8 (*)    Monocytes Absolute 1.1 (*)    All other components within normal limits  COMPREHENSIVE METABOLIC PANEL - Abnormal; Notable for the following:    Sodium 134 (*)    Chloride 92 (*)    Glucose, Bld 111 (*)    BUN 28 (*)    Creatinine, Ser 1.14 (*)    Calcium 12.3 (*)    Total Protein 11.2 (*)    Albumin 2.2 (*)    GFR calc non Af Amer 49 (*)    GFR calc Af Amer 57 (*)    All other components within normal limits  PROTIME-INR - Abnormal; Notable for the following:    Prothrombin Time 51.3 (*)    INR 6.04 (*)    All other components within normal limits  URINE MICROSCOPIC-ADD ON - Abnormal; Notable for the following:    Casts GRANULAR CAST (*)    Crystals CA OXALATE CRYSTALS (*)    All other components  within normal limits  CBC - Abnormal; Notable for the following:    RBC 2.86 (*)    Hemoglobin 7.4 (*)    HCT 23.2 (*)    MCH 25.9 (*)    RDW 19.3 (*)    All other components within normal limits  PROTIME-INR - Abnormal; Notable for the following:    Prothrombin Time 16.7 (*)    All other components within normal limits  BASIC METABOLIC PANEL - Abnormal; Notable for the following:    Chloride 91 (*)    BUN 24 (*)    Creatinine, Ser 1.24 (*)    Calcium 12.4 (*)    GFR calc non Af Amer 44 (*)  GFR calc Af Amer 51 (*)    All other components within normal limits  CBC - Abnormal; Notable for the following:    RBC 2.71 (*)    Hemoglobin 7.0 (*)    HCT 21.9 (*)    MCH 25.8 (*)    RDW 19.3 (*)    All other components within normal limits  RETICULOCYTES - Abnormal; Notable for the following:    Retic Ct Pct 3.4 (*)    RBC. 2.86 (*)    All other components within normal limits  CALCIUM, IONIZED - Abnormal; Notable for the following:    Calcium, Ion 1.66 (*)    All other components within normal limits  VITAMIN D 25 HYDROXY - Abnormal; Notable for the following:    Vit D, 25-Hydroxy 25 (*)    All other components within normal limits  GLUCOSE, CAPILLARY - Abnormal; Notable for the following:    Glucose-Capillary 144 (*)    All other components within normal limits  CBC - Abnormal; Notable for the following:    RBC 3.20 (*)    Hemoglobin 8.5 (*)    HCT 26.0 (*)    RDW 18.9 (*)    All other components within normal limits  GLUCOSE, CAPILLARY - Abnormal; Notable for the following:    Glucose-Capillary 214 (*)    All other components within normal limits  MRSA PCR SCREENING  URINE CULTURE  CULTURE, BLOOD (ROUTINE X 2)  CULTURE, BLOOD (ROUTINE X 2)  TSH  GLUCOSE, CAPILLARY  VITAMIN B12  FOLATE  IRON AND TIBC  FERRITIN  GLUCOSE, CAPILLARY  PTH, INTACT AND CALCIUM  PROTEIN ELECTROPHORESIS, SERUM  IMMUNOFIXATION ELECTROPHORESIS, URINE (WITH TOT PROT)  CBC  BASIC  METABOLIC PANEL  MAGNESIUM  PHOSPHORUS  PROTIME-INR  SEDIMENTATION RATE  C-REACTIVE PROTEIN  TYPE AND SCREEN  PREPARE FRESH FROZEN PLASMA  PREPARE RBC (CROSSMATCH)  PREPARE RBC (CROSSMATCH)   Imaging Review Dg Chest 2 View  03/24/2014   CLINICAL DATA:  Back pain  EXAM: CHEST  2 VIEW  COMPARISON:  09/19/2013  FINDINGS: Cardiomediastinal silhouette is stable. There is streaky bilateral basilar airspace disease highly suspicious for infiltrates. Best seen on lateral view. No pulmonary edema. Osteopenia and degenerative changes thoracic spine. There is moderate compression deformity upper lumbar spine of indeterminate age. Clinical correlation is necessary  IMPRESSION: Streaky bilateral basilar airspace disease suspicious for infiltrates. Follow-up to resolution recommended. No pulmonary edema. Moderate compression fracture upper lumbar spine of indeterminate age. Clinical correlation is necessary.   Electronically Signed   By: Lahoma Crocker M.D.   On: 03/24/2014 18:52   Dg Lumbar Spine 2-3 Views  03/24/2014   CLINICAL DATA:  Back pain.  EXAM: LUMBAR SPINE - 2-3 VIEW  COMPARISON:  DG LUMBAR SPINE 2-3V dated 02/11/2014; DG CHEST 2 VIEW dated 03/24/2014; CT ANGIO CHEST W/CM &/OR WO/CM dated 09/17/2013; DG CHEST 2 VIEW dated 01/21/2013  FINDINGS: There are 5 lumbar type vertebral bodies. There is a degenerative grade 1 anterolisthesis at L4-5 which appears stable. As demonstrated on today's chest radiographs, there is a superior endplate compression deformity at L1 which is new compared with the lumbar spine radiographs obtained 6 weeks ago. This results in approximately 15% loss of vertebral body height. The posterior vertebral body cortex appears grossly intact. There is stable mild disc space loss at L2-3. There is facet disease inferiorly.  IMPRESSION: Superior endplate compression deformity at L1 is new compared with radiographs obtained 6 weeks ago consistent with an acute/subacute injury. Stable  anterolisthesis  at L4-5.   Electronically Signed   By: Camie Patience M.D.   On: 03/24/2014 20:01   Ct Angio Chest W/cm &/or Wo Cm  03/24/2014   CLINICAL DATA:  Hemoptysis.  Shortness of breath and tachycardia.  EXAM: CT ANGIOGRAPHY CHEST WITH CONTRAST  TECHNIQUE: Multidetector CT imaging of the chest was performed using the standard protocol during bolus administration of intravenous contrast. Multiplanar CT image reconstructions and MIPs were obtained to evaluate the vascular anatomy.  CONTRAST:  181mL OMNIPAQUE IOHEXOL 350 MG/ML SOLN  COMPARISON:  Chest radiograph performed earlier today at 6:46 p.m., and CTA of the chest performed 09/17/2013  FINDINGS: There is no evidence of pulmonary embolus. Evaluation for pulmonary embolus is mildly suboptimal due to limitations in the timing of the contrast bolus.  Patchy bilateral airspace consolidation is noted within all lobes of both lungs, though most prominent at the lower lung lobes bilaterally. Findings are compatible with diffuse multifocal pneumonia. There is no evidence of pleural effusion or pneumothorax. No masses are identified, though evaluation is suboptimal due to airspace consolidation; no abnormal focal contrast enhancement is seen.  Diffuse coronary artery calcifications are seen. Scattered mediastinal nodes remain normal in size, though visualized retroesophageal nodes are slightly unusual. No pericardial effusion is identified. The great vessels are grossly unremarkable in appearance. No axillary lymphadenopathy is seen. The visualized portions of the thyroid gland are unremarkable in appearance.  The visualized portions of the liver and spleen are unremarkable. The visualized portions of the pancreas, gallbladder, stomach and adrenal glands are within normal limits.  There is a diffusely heterogeneous appearance to the attenuation of visualized osseous structures, somewhat more prominent than on the prior study and new from 2010; would correlate for evidence of  underlying systemic condition or possibly metastatic disease. There is a partially healed chronic right fifth posterior rib fracture.  Review of the MIP images confirms the above findings.  IMPRESSION: 1. No evidence of pulmonary embolus. 2. Relatively diffuse multifocal pneumonia noted bilaterally. 3. Diffuse coronary artery calcifications seen. 4. Diffusely heterogeneous appearance to the attenuation of visualized osseous structures, somewhat more prominent than on the prior study and new from 2010. Would correlate for evidence of underlying systemic condition, or possibly metastatic disease. Bone scan or correlation with lab findings could be considered for further evaluation, as deemed clinically appropriate.   Electronically Signed   By: Garald Balding M.D.   On: 03/24/2014 21:35   Nm Bone Scan Whole Body  03/25/2014   CLINICAL DATA:  Back pain.  EXAM: NUCLEAR MEDICINE WHOLE BODY BONE SCAN  TECHNIQUE: Whole body anterior and posterior images were obtained approximately 3 hours after intravenous injection of radiopharmaceutical.  RADIOPHARMACEUTICALS:  25 mCi Technetium-99 MDP  COMPARISON:  Chest CT 03/24/2014.  FINDINGS: Punctate area of increased activity noted over approximate the right posterior fifth and a left posterior ninth ribs. These correspond to old rib fractures. Given the mottled appearance of the bony structures of the chest on recent chest CT, myeloma should be considered and serum protein electrophoresis should be considered.  IMPRESSION: 1. Findings consistent with old right posterior fifth and left posterior ninth rib fractures. No evidence of metastatic disease. 2. Given the mottled appearance of the bony structures on recent chest CT, to evaluate for myeloma serum protein electrophoresis should be considered.   Electronically Signed   By: Marcello Moores  Register   On: 03/25/2014 16:11     EKG Interpretation None      MDM   MDM:  68 y.o. WF w/ PMHx of HTN, DM, hx of STEMI, hx of PE on  Coumadin with 2 complaints, 1. LBP, and 2. Hemoptysis. LBP is 1.5 weeks, L side radiating to front, wrosening. Seen in ED before, felt to be MSK pain. Saw orthopedics who scheduled and outpt MRI and believes it to be sciatic. Still hurting, noc hange in pain, no neuro sxs, no b/b incontinence. Also having hemoptysis for 2 days, cough, no SOB or chest pain, no other bleeding sxs, no hematuria, melena, hematochezia, hematemesis. Afebrile, in pain, tachy. HDS. Diffuse back pain, no neuro sxs. Exam c/w MSK pain, will get XR. Hemoptysis with tachy, concern for PE. Will get CTA and lab. LAbs show anemic, with drop from baseline. CTA shows no PE but multilobar PNA. Will treat with Vanc and Zosyn. Pt Hypercalcemic, will give fluids. XR shows COmpression fx, given pani meds. INR supratherapetuic, given Vitamin K. Compression Fx is likely source of back pain. Based on patients fx, PNA, anemia, hypercalcemia, will need admission. Admitted to medicine.  Final diagnoses:  Hemoptysis  Acute pulmonary embolism  Dyspnea  Hypoxemia  Pneumonia, organism unspecified  Pulmonary embolism  Acute blood loss anemia  Hypercalcemia  Vertebral fracture, closed    Admit   Sol Passer, MD 03/26/14 [phone]

## 2014-03-24 NOTE — ED Notes (Signed)
Patient transported to CT 

## 2014-03-24 NOTE — ED Notes (Signed)
Phlebotomy unable to collect labs due to blood admin.

## 2014-03-24 NOTE — ED Notes (Signed)
Pts Spo2: 88% on RA, Pt placed on 2L [place] SpO2 95%

## 2014-03-24 NOTE — ED Notes (Signed)
MD Mingo Amber notified of critical value. INR 6.04.

## 2014-03-24 NOTE — ED Notes (Signed)
Per pt sts lower back pain for a couple of days but worsening. sts was dx with sciatic nerve pain. sts taking pain meds without relief. sts now she is coughing up blood for 2 days.

## 2014-03-24 NOTE — Progress Notes (Signed)
ANTIBIOTIC CONSULT NOTE - INITIAL  Pharmacy Consult for vancomycin + zosyn  Indication: pneumonia  Allergies  Allergen Reactions  . Atorvastatin Other (See Comments)    MYALGIAS  . Codeine Nausea And Vomiting  . Crestor [Rosuvastatin Calcium] Nausea And Vomiting and Other (See Comments)    Leg pain  . Hydrocodone Nausea And Vomiting  . Lipitor [Atorvastatin Calcium] Nausea And Vomiting and Other (See Comments)    MYALGIAS  . Lovastatin Nausea And Vomiting and Other (See Comments)    MYALGIAS   . Niaspan [Niacin Er] Nausea Only  . Robaxin [Methocarbamol] Other (See Comments)    Made patient feel funny  . Sumycin [Tetracycline Hcl] Other (See Comments)    Pt doesn't remember reaction  . Tetracycline Other (See Comments)    Pt doesn't remember reaction  . Zetia [Ezetimibe] Nausea And Vomiting  . Zithromax [Azithromycin] Nausea And Vomiting  . Zocor [Simvastatin] Nausea And Vomiting and Other (See Comments)    Muscle pain    Patient Measurements: Height: 5\' 3"  (160 cm) Weight: 190 lb (86.183 kg) IBW/kg (Calculated) : 52.4 Adjusted Body Weight:   Vital Signs: Temp: 98.4 F (36.9 C) (04/07 2028) Temp src: Oral (04/07 2028) BP: 147/98 mmHg (04/07 2130) Pulse Rate: 117 (04/07 2130) Intake/Output from previous day:   Intake/Output from this shift:    Labs:  Recent Labs  03/24/14 1905  WBC 10.6*  HGB 7.4*  PLT 327  CREATININE 1.14*   Estimated Creatinine Clearance: 50.5 ml/min (by C-G formula based on Cr of 1.14). No results found for this basename: VANCOTROUGH, VANCOPEAK, VANCORANDOM, GENTTROUGH, GENTPEAK, GENTRANDOM, TOBRATROUGH, TOBRAPEAK, TOBRARND, AMIKACINPEAK, AMIKACINTROU, AMIKACIN,  in the last 72 hours   Microbiology: No results found for this or any previous visit (from the past 720 hour(s)).  Medical History: Past Medical History  Diagnosis Date  . Neurofibromatosis   . Diabetes mellitus   . HTN (hypertension)   . Obesity   . Osteoarthritis,  knee   . Hyperlipidemia     statin intolerant. LDL is 83  . UTI (lower urinary tract infection) 05/29/12  . 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 Pulmonary embolism [id number]    Medications:  Anti-infectives   Start     Dose/Rate Route Frequency Ordered Stop   03/25/14 1100  vancomycin (VANCOCIN) IVPB 750 mg/150 ml premix     750 mg 150 mL/hr over 60 Minutes Intravenous Every 12 hours 03/24/14 2156     03/25/14 0700  piperacillin-tazobactam (ZOSYN) IVPB 3.375 g     3.375 g 12.5 mL/hr over 240 Minutes Intravenous Every 8 hours 03/24/14 2156     03/24/14 2200  piperacillin-tazobactam (ZOSYN) IVPB 3.375 g     3.375 g 100 mL/hr over 30 Minutes Intravenous  Once 03/24/14 2153     03/24/14 2200  vancomycin (VANCOCIN) 1,500 mg in sodium chloride 0.9 % 500 mL IVPB     1,500 mg 250 mL/hr over 120 Minutes Intravenous  Once 03/24/14 2153       Assessment: 26 yof presented to the ED coughing up blood. She is on chronic coumadin with a supratherapeutic INR which was reversed. Also starting empiric vancomycin + zosyn for possible pneumonia. Pt is afebrile and WBC is 10.6, Scr is 1.14.  Vanc 4/7>> Zosyn 4/7>>  Goal of Therapy:  Vancomycin trough level 15-20 mcg/ml  Plan:  1. Zosyn 3.375gm IV x 1 over 30 min then 3.375gm IV Q8H (  4 hr inf) 2. Vanc 1500mg  IV x 1 then 750mg  IV Q12H 3. F/u renal fxn, C&S, clinical status and trough at [facility], Rande Lawman 03/24/2014,9:56 PM

## 2014-03-24 NOTE — Consult Note (Addendum)
Name: [name] MRN: [id number] DOB: April 03, 1946    ADMISSION DATE:  03/24/2014 CONSULTATION DATE:  03/24/2014  REFERRING MD :  FPTS PRIMARY SERVICE:  FPTS  CHIEF COMPLAINT:  Hemoptysis  BRIEF PATIENT DESCRIPTION: 68 year old female with PMH of PE post left knee replacement who has been on coumadin with no recent change in her dosing, who noticed hemoptysis two days PTP.  On the night of the seventh noticed that hemoptysis was getting much worse and family forced her to come to the ED.  In the ED was noted to have moderate amount of blood mixed in with her sputum however was also noted to have an INR of 6.  Reports that hemoptysis is worsening but the consistency has been that of phlegm.  She continued to take her coumadin inspite of hemoptysis.  SIGNIFICANT EVENTS / STUDIES:  4/7>>>Hemoptysis.  LINES / TUBES: PIV  CULTURES: Blood 4/7>>> Urine 4/7>>> Sputum 4/7>>>  ANTIBIOTICS: Vancomycin 4/7>>> Zosyn 4/7>>>  PAST MEDICAL HISTORY :  Past Medical History  Diagnosis Date  . Neurofibromatosis   . Diabetes mellitus   . HTN (hypertension)   . Obesity   . Osteoarthritis, knee   . Hyperlipidemia     statin intolerant. LDL is 83  . UTI (lower urinary tract infection) 05/29/12  . 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 Pulmonary embolism [id number]   Past Surgical History  Procedure Laterality Date  . Cardiac catheterization    . Tonsillectomy and adenoidectomy    . Clavicle surgery    . Abdominal hysterectomy    . Ankle fracture surgery Right   . Total knee arthroplasty Left 09/15/2013    Procedure: TOTAL KNEE ARTHROPLASTY;  Surgeon: Vickey Huger, MD;  Location: [facility];  Service: Orthopedics;  Laterality: Left;   Prior to Admission medications   Medication Sig Start Date End Date Taking? Authorizing Provider  aspirin EC 81 MG tablet Take 81 mg by mouth at bedtime.   Yes Historical Provider, MD  glimepiride  (AMARYL) 2 MG tablet Take 4 mg by mouth 2 (two) times daily.    Yes Historical Provider, MD  losartan (COZAAR) 25 MG tablet Take 25 mg by mouth daily.   Yes Historical Provider, MD  Menthol-Methyl Salicylate (MUSCLE RUB EX) Apply 1 application topically 3 (three) times daily as needed (back pain).   Yes Historical Provider, MD  metFORMIN (GLUCOPHAGE) 1000 MG tablet Take 1 tablet (1,000 mg total) by mouth 2 (two) times daily. 07/14/13  Yes Darlin Coco, MD  methocarbamol (ROBAXIN) 500 MG tablet Take 500-1,000 mg by mouth every 4 (four) hours as needed for muscle spasms.  03/18/14  Yes Historical Provider, MD  metoprolol succinate (TOPROL-XL) 25 MG 24 hr tablet Take 25 mg by mouth at bedtime.   Yes Historical Provider, MD  Multiple Vitamin (MULTIVITAMIN WITH MINERALS) TABS tablet Take 1 tablet by mouth at bedtime. Centrum Silver   Yes Historical Provider, MD  Multiple Vitamins-Minerals (OCUVITE PO) Take 1 tablet by mouth daily.    Yes Historical Provider, MD  nitroGLYCERIN (NITROSTAT) 0.4 MG SL tablet Place 1 tablet (0.4 mg total) under the tongue every 5 (five) minutes as needed for chest pain (up to 3 doses). 07/01/13  Yes Darlin Coco, MD  traMADol (ULTRAM) 50 MG tablet Take 50-100 mg by mouth every 4 (four) hours as needed (pain).  03/18/14  Yes Historical Provider, MD  warfarin (COUMADIN) 5 MG tablet  Take 2.5-5 mg by mouth daily at 6 PM. Take 1/2 tablet (2.5 mg) on Mondays and Fridays; take 1 tablet (5 mg) on Sunday, Tuesday, Wednesday, Thursday, Saturday   Yes Historical Provider, MD   Allergies  Allergen Reactions  . Atorvastatin Other (See Comments)    MYALGIAS  . Codeine Nausea And Vomiting  . Crestor [Rosuvastatin Calcium] Nausea And Vomiting and Other (See Comments)    Leg pain  . Hydrocodone Nausea And Vomiting  . Lipitor [Atorvastatin Calcium] Nausea And Vomiting and Other (See Comments)    MYALGIAS  . Lovastatin Nausea And Vomiting and Other (See Comments)    MYALGIAS   .  Niaspan [Niacin Er] Nausea Only  . Robaxin [Methocarbamol] Other (See Comments)    Made patient feel funny  . Sumycin [Tetracycline Hcl] Other (See Comments)    Pt doesn't remember reaction  . Tetracycline Other (See Comments)    Pt doesn't remember reaction  . Zetia [Ezetimibe] Nausea And Vomiting  . Zithromax [Azithromycin] Nausea And Vomiting  . Zocor [Simvastatin] Nausea And Vomiting and Other (See Comments)    Muscle pain    FAMILY HISTORY:  Family History  Problem Relation Age of Onset  . Heart disease Mother   . Arthritis Mother   . Stroke Mother   . Heart disease Son    SOCIAL HISTORY:  reports that she has never smoked. She has never used smokeless tobacco. She reports that she does not drink alcohol or use illicit drugs.  REVIEW OF SYSTEMS:   Constitutional: Negative for fever, chills, weight loss, malaise/fatigue and diaphoresis.  HENT: Negative for hearing loss, ear pain, nosebleeds, congestion, sore throat, neck pain, tinnitus and ear discharge.   Eyes: Negative for blurred vision, double vision, photophobia, pain, discharge and redness.  Respiratory: Negative for cough, wheezing or stridor.  But positive for hemoptysis as above, sputum production and shortness of breath.  Cardiovascular: Negative for chest pain, palpitations, orthopnea, claudication, leg swelling and PND.  Gastrointestinal: Negative for heartburn, nausea, vomiting, abdominal pain, diarrhea, constipation, blood in stool and melena.  Genitourinary: Negative for dysuria, urgency, frequency, hematuria and flank pain.  Musculoskeletal: Negative for myalgias, back pain, joint pain and falls.  Skin: Negative for itching and rash.  Neurological: Negative for dizziness, tingling, tremors, sensory change, speech change, focal weakness, seizures, loss of consciousness, weakness and headaches.  Endo/Heme/Allergies: Negative for environmental allergies and polydipsia. Does not bruise/bleed easily.  SUBJECTIVE:  Hemoptysis as above.  VITAL SIGNS: Temp:  [98 F (36.7 C)-98.8 F (37.1 C)] 98.3 F (36.8 C) (04/07 2315) Pulse Rate:  [108-121] 113 (04/07 2230) Resp:  [14-22] 15 (04/07 2230) BP: (133-153)/(58-98) 134/58 mmHg (04/07 2230) SpO2:  [86 %-97 %] 96 % (04/07 2230) Weight:  [190 lb (86.183 kg)] 190 lb (86.183 kg) (04/07 1407)  PHYSICAL EXAMINATION: General:  Chronically ill appearing female, NAD. Neuro:  Alert and interactive, following all commands. HEENT:  [place]/AT, PERRL, EOM-I and MMM, blood on tongue. Neck:  Supple, -LAN and -thyromegally. Cardiovascular:  RRR, Nl S1/S2, -M/R/G. Lungs:  Bibasilar crackles. Abdomen:  Soft, NT, ND and +BS. Musculoskeletal:  -edema and -tenderness. Skin:  Intact.  Recent Labs Lab 03/24/14 1905  NA 134*  K 4.8  CL 92*  CO2 23  BUN 28*  CREATININE 1.14*  GLUCOSE 111*   Recent Labs Lab 03/24/14 1905  HGB 7.4*  HCT 23.0*  WBC 10.6*  PLT 327   Dg Chest 2 View  03/24/2014   CLINICAL DATA:  Back pain  EXAM: CHEST  2 VIEW  COMPARISON:  09/19/2013  FINDINGS: Cardiomediastinal silhouette is stable. There is streaky bilateral basilar airspace disease highly suspicious for infiltrates. Best seen on lateral view. No pulmonary edema. Osteopenia and degenerative changes thoracic spine. There is moderate compression deformity upper lumbar spine of indeterminate age. Clinical correlation is necessary  IMPRESSION: Streaky bilateral basilar airspace disease suspicious for infiltrates. Follow-up to resolution recommended. No pulmonary edema. Moderate compression fracture upper lumbar spine of indeterminate age. Clinical correlation is necessary.   Electronically Signed   By: Lahoma Crocker M.D.   On: 03/24/2014 18:52   Dg Lumbar Spine 2-3 Views  03/24/2014   CLINICAL DATA:  Back pain.  EXAM: LUMBAR SPINE - 2-3 VIEW  COMPARISON:  DG LUMBAR SPINE 2-3V dated 02/11/2014; DG CHEST 2 VIEW dated 03/24/2014; CT ANGIO CHEST W/CM &/OR WO/CM dated 09/17/2013; DG CHEST 2 VIEW dated  01/21/2013  FINDINGS: There are 5 lumbar type vertebral bodies. There is a degenerative grade 1 anterolisthesis at L4-5 which appears stable. As demonstrated on today's chest radiographs, there is a superior endplate compression deformity at L1 which is new compared with the lumbar spine radiographs obtained 6 weeks ago. This results in approximately 15% loss of vertebral body height. The posterior vertebral body cortex appears grossly intact. There is stable mild disc space loss at L2-3. There is facet disease inferiorly.  IMPRESSION: Superior endplate compression deformity at L1 is new compared with radiographs obtained 6 weeks ago consistent with an acute/subacute injury. Stable anterolisthesis at L4-5.   Electronically Signed   By: Camie Patience M.D.   On: 03/24/2014 20:01   Ct Angio Chest W/cm &/or Wo Cm  03/24/2014   CLINICAL DATA:  Hemoptysis.  Shortness of breath and tachycardia.  EXAM: CT ANGIOGRAPHY CHEST WITH CONTRAST  TECHNIQUE: Multidetector CT imaging of the chest was performed using the standard protocol during bolus administration of intravenous contrast. Multiplanar CT image reconstructions and MIPs were obtained to evaluate the vascular anatomy.  CONTRAST:  180mL OMNIPAQUE IOHEXOL 350 MG/ML SOLN  COMPARISON:  Chest radiograph performed earlier today at 6:46 p.m., and CTA of the chest performed 09/17/2013  FINDINGS: There is no evidence of pulmonary embolus. Evaluation for pulmonary embolus is mildly suboptimal due to limitations in the timing of the contrast bolus.  Patchy bilateral airspace consolidation is noted within all lobes of both lungs, though most prominent at the lower lung lobes bilaterally. Findings are compatible with diffuse multifocal pneumonia. There is no evidence of pleural effusion or pneumothorax. No masses are identified, though evaluation is suboptimal due to airspace consolidation; no abnormal focal contrast enhancement is seen.  Diffuse coronary artery calcifications are  seen. Scattered mediastinal nodes remain normal in size, though visualized retroesophageal nodes are slightly unusual. No pericardial effusion is identified. The great vessels are grossly unremarkable in appearance. No axillary lymphadenopathy is seen. The visualized portions of the thyroid gland are unremarkable in appearance.  The visualized portions of the liver and spleen are unremarkable. The visualized portions of the pancreas, gallbladder, stomach and adrenal glands are within normal limits.  There is a diffusely heterogeneous appearance to the attenuation of visualized osseous structures, somewhat more prominent than on the prior study and new from 2010; would correlate for evidence of underlying systemic condition or possibly metastatic disease. There is a partially healed chronic right fifth posterior rib fracture.  Review of the MIP images confirms the above findings.  IMPRESSION: 1. No evidence of pulmonary embolus. 2. Relatively diffuse multifocal pneumonia noted  bilaterally. 3. Diffuse coronary artery calcifications seen. 4. Diffusely heterogeneous appearance to the attenuation of visualized osseous structures, somewhat more prominent than on the prior study and new from 2010. Would correlate for evidence of underlying systemic condition, or possibly metastatic disease. Bone scan or correlation with lab findings could be considered for further evaluation, as deemed clinically appropriate.   Electronically Signed   By: Garald Balding M.D.   On: 03/24/2014 21:35    ASSESSMENT / PLAN:  68 year old female with PMH of a PE after knee replacement surgery who developed hemoptysis, continued to take her coumadin, presenting coagulopathic and with hemoptysis.  Not frank blood at this point, warned that if changes to frank blood she needs to notify nurse.  Plan: - No indication for bronchoscopy at this point. - If hemoptysis transforms to frank blood then may need interventions. - Reverse he  coagulopathy with four units of FFP and 10 mg of vit K IV. - F/U PT/PTT/INR. - No flutter valve for now until coagulopathy is reversed then may use to expectorate residual blood. - Anticipate will be coughing up blood for days after the coagulopathy if reversed but if worsens after reversing coagulopathy then will need to be notified. - Chest CT findings are consistent with dependent infiltrate likely from breathing in blood, however given history would agree with HCAP coverage. - Pan cultures would be recommended. - Ok to admit to SDU. - If worsens please notify PCCM. - Would not recommend restarting any anti-coagulation until complete resolution of her hemoptysis (she started in 9/14, can argue that 7 months are enough anti-coagulation and no need for further anti-coag at this point and may not even start it at all). - Discuss with admitting resident.  Rush Farmer, M.D. [facility] Pulmonary/Critical Care Medicine. Pager: [phone]. After hours pager: [phone].

## 2014-03-24 NOTE — H&P (Signed)
[facility] Admission History and Physical Service Pager: [phone]  Patient name: [name] Medical record number: [id number] Date of birth: May 22, 1946 Age: 68 y.o. Gender: female  Primary Care Provider: Jenny Reichmann, MD Consultants: PCCM Code Status: full  Chief Complaint: back pain  Assessment and Plan: [name] is a 68 y.o. female presenting with hemoptysis and back pain. PMH is significant for HTN, DM, STEMI, PE on coumadin.  # Pulm: Hemoptysis Bilateral infiltrates O2 requirement Patient with recent onset hemoptysis in setting of bilateral infiltrates seen on CTA and supratherapeutic INR to 6 on coumadin. Patient with drop in Hgb to 7.4 from previous baseline of 10. Likely this is related to bilateral infiltrates and elevated INR. Must additionally consider bronchitis, vaculitis, and neoplasm. Note there is also hematuria, which could indicate a systemic issue. -will admit to step-down, Attending Dr McDiarmid -vitamin K started in the ED -discussed case with Dr Nelda Marseille and he has agreed to consult on the patient, I appreciate his help in managing this patient - f/u there rec's -will give 4 U FFP and one unit RBCs -will hold coumadin -will continue vanc and zosyn started in the ED for now given the extent of infiltrates, though given that this is likely CAP and vitals are stable, will plan to narrow quickly -IVF for volume   # CV: Tachycardia - likely related to patients anemia and volume depletion HTN  -s/p 1 L NS bolus in ED - will be giving blood products for resuscitation, if still tachycardic will give additional NS -will hold home BP medications at this time given current bleeding and concern that she could become hypotensive  # GI:  Hypoalbuminemic -NPO given possibility for bronchoscopy if bleeding continues -nutrition consult in am  # Renal:  AKI - likely related to volume depletion Hyponatremia -  mild Hypercalcemia -will trend Cr -s/p bolus NS -will start on 1/2 NS 125/hr  # Heme: Supratherapeutic INR Anemia Leukocytosis -FFP and RBCs per above -s/p vitamin K in ED -cbc now then every 4 hours -trend INR  # ID: Bilateral infiltrates concerning for PNA Leukocytosis -antibiotics per above -monitor fever curve -obtain BCx and UCx  # MSK: Vertebral lumbar spine fracture - likely cause of her recent back pain, does not appear to be a cauda equina -treat current pain with dilaudid -will likely need to start calcium supplementation once hypercalcemia resolves  Prophylaxis: none given supratherapeutic INR  Disposition: admit to stepdown, pending stabilization of hemoptysis and normalization of INR  History of Present Illness: [name] is a 68 y.o. female presenting with hemoptysis and back pain.   Patient notes hemoptysis started yesterday evening. It started out as darker "loogie's" which were coughed in to tissues and progressively changed to bright red blood. She has continued to cough up blood since this started. She notes that she has developed shortness of breath with this. She is not typically on oxygen at home. She denies any chest pain or bleeding from elsewhere. She notes she is on warfarin due to a PE that occurred October 2014. She states they have had a hard time getting her INR under control. The highest it has been is 4, though she states this was not recently. She states her INR was last checked March 25th. Her INR was 3.1 at that time and she was switched to 5 mg STWTS and 2.5 mg MF. She denies any history of hemoptysis. She denies that her Hgb has ever been  low previously. She notes that she is not on oxygen at home.  Patient presents with lower back pain for the past 3-4 weeks. Notes the pain has gotten so bad that it has prevented her from walking. She has been seen by her orthopedist for this issue recently and states they have been treating her for a  disc bulge leading to sciatica. She does not recal a specific injury. She notes the pain is over the paraspinous area of her back and moves laterally. She denies recent fevers or history of cancer. She denies urinary issues, though notes constipation since last Friday. She denies saddle anesthesia.  Review Of Systems: Per HPI with the following additions: none Otherwise 12 point review of systems was performed and was unremarkable.  Patient Active Problem List   Diagnosis Date Noted  . Encounter for therapeutic drug monitoring 01/14/2014  . Fe deficiency anemia 12/31/2013  . Long term (current) use of anticoagulants 09/24/2013  . Pulmonary embolism 09/18/2013  . Acute pulmonary embolism 09/17/2013  . Hypoxemia 09/17/2013  . Onychomycosis 04/29/2013  . Pain in joint, ankle and foot 04/29/2013  . Benign hypertensive heart disease without heart failure 10/15/2012  . STEMI (ST elevation myocardial infarction) 05/30/2012  . Dyspnea 11/15/2011  . Herpes zoster 07/17/2011  . Fatigue 03/10/2011  . Chest pain at rest 03/10/2011  . Acute sinusitis, unspecified 03/10/2011  . Neurofibromatosis   . Type II or unspecified type diabetes mellitus without mention of complication, uncontrolled   . HTN (hypertension)   . Obesity   . Osteoarthritis, knee    Past Medical History: Past Medical History  Diagnosis Date  . Neurofibromatosis   . Diabetes mellitus   . HTN (hypertension)   . Obesity   . Osteoarthritis, knee   . Hyperlipidemia     statin intolerant. LDL is 83  . UTI (lower urinary tract infection) 05/29/12  . 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 Pulmonary embolism [id number]   Past Surgical History: Past Surgical History  Procedure Laterality Date  . Cardiac catheterization    . Tonsillectomy and adenoidectomy    . Clavicle surgery    . Abdominal hysterectomy    . Ankle fracture surgery Right   . Total knee  arthroplasty Left 09/15/2013    Procedure: TOTAL KNEE ARTHROPLASTY;  Surgeon: Dannielle Huh, MD;  Location: [facility] OR;  Service: Orthopedics;  Laterality: Left;   Social History: History  Substance Use Topics  . Smoking status: Never Smoker   . Smokeless tobacco: Never Used  . Alcohol Use: No   Additional social history: none  Please also refer to relevant sections of EMR.  Family History: Family History  Problem Relation Age of Onset  . Heart disease Mother   . Arthritis Mother   . Stroke Mother   . Heart disease Son    Allergies and Medications: Allergies  Allergen Reactions  . Atorvastatin Other (See Comments)    MYALGIAS  . Codeine Nausea And Vomiting  . Crestor [Rosuvastatin Calcium] Nausea And Vomiting and Other (See Comments)    Leg pain  . Hydrocodone Nausea And Vomiting  . Lipitor [Atorvastatin Calcium] Nausea And Vomiting and Other (See Comments)    MYALGIAS  . Lovastatin Nausea And Vomiting and Other (See Comments)    MYALGIAS   . Niaspan [Niacin Er] Nausea Only  . Robaxin [Methocarbamol] Other (See Comments)    Made patient feel funny  . Sumycin [Tetracycline  Hcl] Other (See Comments)    Pt doesn't remember reaction  . Tetracycline Other (See Comments)    Pt doesn't remember reaction  . Zetia [Ezetimibe] Nausea And Vomiting  . Zithromax [Azithromycin] Nausea And Vomiting  . Zocor [Simvastatin] Nausea And Vomiting and Other (See Comments)    Muscle pain   No current facility-administered medications on file prior to encounter.   Current Outpatient Prescriptions on File Prior to Encounter  Medication Sig Dispense Refill  . glimepiride (AMARYL) 2 MG tablet Take 4 mg by mouth 2 (two) times daily.       Marland Kitchen losartan (COZAAR) 25 MG tablet Take 25 mg by mouth daily.      . metFORMIN (GLUCOPHAGE) 1000 MG tablet Take 1 tablet (1,000 mg total) by mouth 2 (two) times daily.  180 tablet  3  . metoprolol succinate (TOPROL-XL) 25 MG 24 hr tablet Take 25 mg by mouth at  bedtime.      . Multiple Vitamins-Minerals (OCUVITE PO) Take 1 tablet by mouth daily.       . nitroGLYCERIN (NITROSTAT) 0.4 MG SL tablet Place 1 tablet (0.4 mg total) under the tongue every 5 (five) minutes as needed for chest pain (up to 3 doses).  25 tablet  3  . warfarin (COUMADIN) 5 MG tablet Take 2.5-5 mg by mouth daily at 6 PM. Take 1/2 tablet (2.5 mg) on Mondays and Fridays; take 1 tablet (5 mg) on Sunday, Tuesday, Wednesday, Thursday, Saturday        Objective: BP 152/77  Pulse 113  Temp(Src) 98.4 F (36.9 C) (Oral)  Resp 22  Ht 5\' 3"  (1.6 m)  Wt 190 lb (86.183 kg)  BMI 33.67 kg/m2  SpO2 92% Exam: General: NAD, resting in bed HEENT: NCAT, coughed up bright red blood on exam, had bright red blood in oral cavity, MMM, [place] in place Cardiovascular: tachycardic, regular rhythm, no murmurs appreciated Respiratory: crackles throughout, no wheezes, no increased work of breathing on 5 L [place] Abdomen: soft, NT, ND, no guarding or rebound Extremities: warm, well perfused, no edema Skin: pale throughout, no additional rashes Neuro: alert, PERRL, CN 2-12 intact, 5/5 strength in bilateral biceps, triceps, grip, hip flexion, quads, plantar and dorsiflexion, sensation to light touch intact, 2+ patellar reflexes MSK: no swelling or erythema noted over her lower back, no midline tenderness, no tenderness else where  Labs and Imaging: CBC BMET   Recent Labs Lab 03/24/14 1905  WBC 10.6*  HGB 7.4*  HCT 23.0*  PLT 327    Recent Labs Lab 03/24/14 1905  NA 134*  K 4.8  CL 92*  CO2 23  BUN 28*  CREATININE 1.14*  GLUCOSE 111*  CALCIUM 12.3*     INR 6.04 UA with large blood  Dg Chest 2 View  03/24/2014   CLINICAL DATA:  Back pain  EXAM: CHEST  2 VIEW  COMPARISON:  09/19/2013  FINDINGS: Cardiomediastinal silhouette is stable. There is streaky bilateral basilar airspace disease highly suspicious for infiltrates. Best seen on lateral view. No pulmonary edema. Osteopenia and degenerative  changes thoracic spine. There is moderate compression deformity upper lumbar spine of indeterminate age. Clinical correlation is necessary  IMPRESSION: Streaky bilateral basilar airspace disease suspicious for infiltrates. Follow-up to resolution recommended. No pulmonary edema. Moderate compression fracture upper lumbar spine of indeterminate age. Clinical correlation is necessary.   Electronically Signed   By: Lahoma Crocker M.D.   On: 03/24/2014 18:52   Dg Lumbar Spine 2-3 Views  03/24/2014  CLINICAL DATA:  Back pain.  EXAM: LUMBAR SPINE - 2-3 VIEW  COMPARISON:  DG LUMBAR SPINE 2-3V dated 02/11/2014; DG CHEST 2 VIEW dated 03/24/2014; CT ANGIO CHEST W/CM &/OR WO/CM dated 09/17/2013; DG CHEST 2 VIEW dated 01/21/2013  FINDINGS: There are 5 lumbar type vertebral bodies. There is a degenerative grade 1 anterolisthesis at L4-5 which appears stable. As demonstrated on today's chest radiographs, there is a superior endplate compression deformity at L1 which is new compared with the lumbar spine radiographs obtained 6 weeks ago. This results in approximately 15% loss of vertebral body height. The posterior vertebral body cortex appears grossly intact. There is stable mild disc space loss at L2-3. There is facet disease inferiorly.  IMPRESSION: Superior endplate compression deformity at L1 is new compared with radiographs obtained 6 weeks ago consistent with an acute/subacute injury. Stable anterolisthesis at L4-5.   Electronically Signed   By: Camie Patience M.D.   On: 03/24/2014 20:01   Ct Angio Chest W/cm &/or Wo Cm  03/24/2014   CLINICAL DATA:  Hemoptysis.  Shortness of breath and tachycardia.  EXAM: CT ANGIOGRAPHY CHEST WITH CONTRAST  TECHNIQUE: Multidetector CT imaging of the chest was performed using the standard protocol during bolus administration of intravenous contrast. Multiplanar CT image reconstructions and MIPs were obtained to evaluate the vascular anatomy.  CONTRAST:  171mL OMNIPAQUE IOHEXOL 350 MG/ML SOLN   COMPARISON:  Chest radiograph performed earlier today at 6:46 p.m., and CTA of the chest performed 09/17/2013  FINDINGS: There is no evidence of pulmonary embolus. Evaluation for pulmonary embolus is mildly suboptimal due to limitations in the timing of the contrast bolus.  Patchy bilateral airspace consolidation is noted within all lobes of both lungs, though most prominent at the lower lung lobes bilaterally. Findings are compatible with diffuse multifocal pneumonia. There is no evidence of pleural effusion or pneumothorax. No masses are identified, though evaluation is suboptimal due to airspace consolidation; no abnormal focal contrast enhancement is seen.  Diffuse coronary artery calcifications are seen. Scattered mediastinal nodes remain normal in size, though visualized retroesophageal nodes are slightly unusual. No pericardial effusion is identified. The great vessels are grossly unremarkable in appearance. No axillary lymphadenopathy is seen. The visualized portions of the thyroid gland are unremarkable in appearance.  The visualized portions of the liver and spleen are unremarkable. The visualized portions of the pancreas, gallbladder, stomach and adrenal glands are within normal limits.  There is a diffusely heterogeneous appearance to the attenuation of visualized osseous structures, somewhat more prominent than on the prior study and new from 2010; would correlate for evidence of underlying systemic condition or possibly metastatic disease. There is a partially healed chronic right fifth posterior rib fracture.  Review of the MIP images confirms the above findings.  IMPRESSION: 1. No evidence of pulmonary embolus. 2. Relatively diffuse multifocal pneumonia noted bilaterally. 3. Diffuse coronary artery calcifications seen. 4. Diffusely heterogeneous appearance to the attenuation of visualized osseous structures, somewhat more prominent than on the prior study and new from 2010. Would correlate for  evidence of underlying systemic condition, or possibly metastatic disease. Bone scan or correlation with lab findings could be considered for further evaluation, as deemed clinically appropriate.   Electronically Signed   By: Garald Balding M.D.   On: 03/24/2014 21:35     Leone Haven, MD 03/24/2014, 10:31 PM PGY-2, [facility] pager: [phone], text pages welcome

## 2014-03-25 ENCOUNTER — Inpatient Hospital Stay (HOSPITAL_COMMUNITY): Payer: Medicare Other

## 2014-03-25 ENCOUNTER — Encounter (HOSPITAL_COMMUNITY): Payer: Self-pay | Admitting: *Deleted

## 2014-03-25 ENCOUNTER — Encounter (HOSPITAL_COMMUNITY): Payer: Medicare Other

## 2014-03-25 DIAGNOSIS — S32009A Unspecified fracture of unspecified lumbar vertebra, initial encounter for closed fracture: Secondary | ICD-10-CM | POA: Diagnosis present

## 2014-03-25 DIAGNOSIS — R042 Hemoptysis: Secondary | ICD-10-CM | POA: Diagnosis present

## 2014-03-25 DIAGNOSIS — IMO0002 Reserved for concepts with insufficient information to code with codable children: Secondary | ICD-10-CM

## 2014-03-25 DIAGNOSIS — D62 Acute posthemorrhagic anemia: Secondary | ICD-10-CM

## 2014-03-25 LAB — GLUCOSE, CAPILLARY
GLUCOSE-CAPILLARY: 144 mg/dL — AB (ref 70–99)
Glucose-Capillary: 87 mg/dL (ref 70–99)
Glucose-Capillary: 91 mg/dL (ref 70–99)
Glucose-Capillary: 214 mg/dL — ABNORMAL HIGH (ref 70–99)

## 2014-03-25 LAB — IRON AND TIBC
IRON: 80 ug/dL (ref 42–135)
Saturation Ratios: 26 % (ref 20–55)
TIBC: 302 ug/dL (ref 250–470)
UIBC: 222 ug/dL (ref 125–400)

## 2014-03-25 LAB — CBC
HCT: 23.2 % — ABNORMAL LOW (ref 36.0–46.0)
HCT: 26 % — ABNORMAL LOW (ref 36.0–46.0)
HEMATOCRIT: 21.9 % — AB (ref 36.0–46.0)
Hemoglobin: 7 g/dL — ABNORMAL LOW (ref 12.0–15.0)
Hemoglobin: 7.4 g/dL — ABNORMAL LOW (ref 12.0–15.0)
Hemoglobin: 8.5 g/dL — ABNORMAL LOW (ref 12.0–15.0)
MCH: 25.8 pg — ABNORMAL LOW (ref 26.0–34.0)
MCH: 25.9 pg — ABNORMAL LOW (ref 26.0–34.0)
MCH: 26.6 pg (ref 26.0–34.0)
MCHC: 32 g/dL (ref 30.0–36.0)
MCHC: 31.9 g/dL (ref 30.0–36.0)
MCHC: 32.7 g/dL (ref 30.0–36.0)
MCV: 80.8 fL (ref 78.0–100.0)
MCV: 81.1 fL (ref 78.0–100.0)
MCV: 81.3 fL (ref 78.0–100.0)
PLATELETS: 275 10*3/uL (ref 150–400)
Platelets: 268 10*3/uL (ref 150–400)
Platelets: 285 10*3/uL (ref 150–400)
RBC: 2.71 MIL/uL — AB (ref 3.87–5.11)
RBC: 2.86 MIL/uL — AB (ref 3.87–5.11)
RBC: 3.2 MIL/uL — ABNORMAL LOW (ref 3.87–5.11)
RDW: 19.3 % — AB (ref 11.5–15.5)
RDW: 19.3 % — ABNORMAL HIGH (ref 11.5–15.5)
RDW: 18.9 % — AB (ref 11.5–15.5)
WBC: 8.5 10*3/uL (ref 4.0–10.5)
WBC: 9.4 10*3/uL (ref 4.0–10.5)
WBC: 9.4 10*3/uL (ref 4.0–10.5)

## 2014-03-25 LAB — PROTIME-INR
INR: 1.39 (ref 0.00–1.49)
PROTHROMBIN TIME: 16.7 s — AB (ref 11.6–15.2)

## 2014-03-25 LAB — TSH: TSH: 0.889 u[IU]/mL (ref 0.350–4.500)

## 2014-03-25 LAB — BASIC METABOLIC PANEL
BUN: 24 mg/dL — AB (ref 6–23)
CHLORIDE: 91 meq/L — AB (ref 96–112)
CO2: 28 mEq/L (ref 19–32)
Calcium: 12.4 mg/dL — ABNORMAL HIGH (ref 8.4–10.5)
Creatinine, Ser: 1.24 mg/dL — ABNORMAL HIGH (ref 0.50–1.10)
GFR, EST AFRICAN AMERICAN: 51 mL/min — AB (ref >90)
GFR, EST NON AFRICAN AMERICAN: 44 mL/min — AB (ref >90)
Glucose, Bld: 92 mg/dL (ref 70–99)
POTASSIUM: 4.2 meq/L (ref 3.7–5.3)
SODIUM: 138 meq/L (ref 137–147)

## 2014-03-25 LAB — VITAMIN B12: Vitamin B-12: 263 pg/mL (ref 211–911)

## 2014-03-25 LAB — FOLATE: Folate: >20 ng/mL

## 2014-03-25 LAB — PREPARE RBC (CROSSMATCH)

## 2014-03-25 LAB — CALCIUM, IONIZED: CALCIUM ION: 1.66 mmol/L — AB (ref 1.13–1.30)

## 2014-03-25 LAB — RETICULOCYTES
RBC.: 2.86 MIL/uL — ABNORMAL LOW (ref 3.87–5.11)
RETIC CT PCT: 3.4 % — AB (ref 0.4–3.1)
Retic Count, Absolute: 97.2 10*3/uL (ref 19.0–186.0)

## 2014-03-25 LAB — MRSA PCR SCREENING: MRSA by PCR: NEGATIVE

## 2014-03-25 LAB — FERRITIN: FERRITIN: 14 ng/mL (ref 10–291)

## 2014-03-25 MED ORDER — FUROSEMIDE 10 MG/ML IJ SOLN
20.0000 mg | Freq: Once | INTRAMUSCULAR | Status: AC
  Administered 2014-03-25: 20 mg via INTRAVENOUS

## 2014-03-25 MED ORDER — TECHNETIUM TC 99M MEDRONATE IV KIT
25.0000 | PACK | Freq: Once | INTRAVENOUS | Status: AC | PRN
  Administered 2014-03-25: 25 via INTRAVENOUS

## 2014-03-25 MED ORDER — SODIUM CHLORIDE 0.9 % IJ SOLN
3.0000 mL | Freq: Two times a day (BID) | INTRAMUSCULAR | Status: DC
  Administered 2014-03-25 – 2014-03-31 (×13): 3 mL via INTRAVENOUS

## 2014-03-25 MED ORDER — POTASSIUM CHLORIDE CRYS ER 20 MEQ PO TBCR
40.0000 meq | EXTENDED_RELEASE_TABLET | Freq: Once | ORAL | Status: AC
  Administered 2014-03-25: 40 meq via ORAL
  Filled 2014-03-25: qty 2

## 2014-03-25 MED ORDER — INSULIN ASPART 100 UNIT/ML ~~LOC~~ SOLN
0.0000 [IU] | Freq: Every day | SUBCUTANEOUS | Status: DC
  Administered 2014-03-25 – 2014-03-26 (×2): 2 [IU] via SUBCUTANEOUS

## 2014-03-25 MED ORDER — HYDROMORPHONE HCL PF 1 MG/ML IJ SOLN
0.5000 mg | INTRAMUSCULAR | Status: DC | PRN
  Administered 2014-03-25 – 2014-03-30 (×15): 0.5 mg via INTRAVENOUS
  Administered 2014-03-30: 16:00:00 via INTRAVENOUS
  Filled 2014-03-25 (×15): qty 1

## 2014-03-25 MED ORDER — CALCITONIN (SALMON) 200 UNIT/ACT NA SOLN
1.0000 | Freq: Every day | NASAL | Status: DC
  Administered 2014-03-25: 1 via NASAL
  Filled 2014-03-25: qty 3.7

## 2014-03-25 MED ORDER — SENNOSIDES-DOCUSATE SODIUM 8.6-50 MG PO TABS
1.0000 | ORAL_TABLET | Freq: Two times a day (BID) | ORAL | Status: DC
  Administered 2014-03-25 (×2): 1 via ORAL
  Filled 2014-03-25 (×2): qty 1

## 2014-03-25 MED ORDER — FUROSEMIDE 10 MG/ML IJ SOLN
40.0000 mg | Freq: Three times a day (TID) | INTRAMUSCULAR | Status: DC
  Administered 2014-03-25: 40 mg via INTRAVENOUS
  Filled 2014-03-25: qty 4

## 2014-03-25 MED ORDER — SODIUM CHLORIDE 0.9 % IV SOLN
INTRAVENOUS | Status: AC
  Administered 2014-03-25 – 2014-03-26 (×2): via INTRAVENOUS

## 2014-03-25 MED ORDER — SODIUM CHLORIDE 0.45 % IV SOLN: INTRAVENOUS | Status: DC

## 2014-03-25 MED ORDER — INSULIN ASPART 100 UNIT/ML ~~LOC~~ SOLN
0.0000 [IU] | Freq: Three times a day (TID) | SUBCUTANEOUS | Status: DC
  Administered 2014-03-25: 2 [IU] via SUBCUTANEOUS
  Administered 2014-03-26 (×2): 3 [IU] via SUBCUTANEOUS
  Administered 2014-03-26: 2 [IU] via SUBCUTANEOUS
  Administered 2014-03-27 (×2): 3 [IU] via SUBCUTANEOUS
  Administered 2014-03-27: 2 [IU] via SUBCUTANEOUS
  Administered 2014-03-28: 3 [IU] via SUBCUTANEOUS
  Administered 2014-03-28: 5 [IU] via SUBCUTANEOUS
  Administered 2014-03-29 – 2014-03-31 (×3): 3 [IU] via SUBCUTANEOUS
  Administered 2014-03-31: 2 [IU] via SUBCUTANEOUS

## 2014-03-25 NOTE — Progress Notes (Signed)
OT Cancellation Note  Patient Details Name: [name] MRN: [id number] DOB: 07/30/1946   Cancelled Treatment:    Reason Eval/Treat Not Completed: Patient at procedure or test/ unavailable (off unit at procedure)  [name] A Bo Teicher 03/25/2014, 2:28 PM

## 2014-03-25 NOTE — Care Management Note (Signed)
    Page 1 of 1   03/25/2014     9:28:12 AM   CARE MANAGEMENT NOTE 03/25/2014  Patient:  [name], [name]   Account Number:  [id number]  Date Initiated:  03/25/2014  Documentation initiated by:  Elissa Hefty  Subjective/Objective Assessment:   adm w pneumonia     Action/Plan:   lives w husband, pcp dr Remo Lipps daub   Anticipated DC Date:     Anticipated DC Plan:           Choice offered to / List presented to:             Status of service:   Medicare Important Message given?   (If response is "NO", the following Medicare IM given date fields will be blank) Date Medicare IM given:   Date Additional Medicare IM given:    Discharge Disposition:    Per UR Regulation:  Reviewed for med. necessity/level of care/duration of stay  If discussed at [place] of Stay Meetings, dates discussed:    Comments:

## 2014-03-25 NOTE — Progress Notes (Signed)
Name: [name] MRN: [id number] DOB: 16-Oct-1946    ADMISSION DATE:  03/24/2014 CONSULTATION DATE:  03/24/2014  REFERRING MD :  FPTS PRIMARY SERVICE:  FPTS  CHIEF COMPLAINT:  Hemoptysis  BRIEF PATIENT DESCRIPTION: 68 year old female with PMH of PE post left knee replacement who has been on coumadin with no recent change in her dosing, who noticed hemoptysis two days PTP.  On the night of the seventh noticed that hemoptysis was getting much worse and family forced her to come to the ED.  In the ED was noted to have moderate amount of blood mixed in with her sputum however was also noted to have an INR of 6.  Reports that hemoptysis is worsening but the consistency has been that of phlegm.  She continued to take her coumadin inspite of hemoptysis.  SIGNIFICANT EVENTS / STUDIES:  4/7>>>Hemoptysis.  LINES / TUBES: PIV  CULTURES: Blood 4/7>>> Urine 4/7>>> Sputum 4/7>>>  ANTIBIOTICS: Vancomycin 4/7>>> Zosyn 4/7>>>  SUBJECTIVE: Hemoptysis persistent overnight but remains mixed in with sputum.  VITAL SIGNS: Temp:  [98 F (36.7 C)-98.8 F (37.1 C)] 98.8 F (37.1 C) (04/08 0545) Pulse Rate:  [97-121] 97 (04/08 0545) Resp:  [14-24] 20 (04/08 0545) BP: (116-163)/(47-98) 132/47 mmHg (04/08 0545) SpO2:  [86 %-100 %] 95 % (04/08 0545) Weight:  [190 lb (86.183 kg)] 190 lb (86.183 kg) (04/07 1407)  PHYSICAL EXAMINATION: General:  Chronically ill appearing female, NAD. Neuro:  Alert and interactive, following all commands. HEENT:  [place]/AT, PERRL, EOM-I and MMM, blood on tongue. Neck:  Supple, -LAN and -thyromegally. Cardiovascular:  RRR, Nl S1/S2, -M/R/G. Lungs:  Bibasilar crackles, worsened overnight. Abdomen:  Soft, NT, ND and +BS. Musculoskeletal:  -edema and -tenderness. Skin:  Intact.  Recent Labs Lab 03/24/14 1905  NA 134*  K 4.8  CL 92*  CO2 23  BUN 28*  CREATININE 1.14*  GLUCOSE 111*    Recent Labs Lab 03/24/14 1905  HGB 7.4*  HCT 23.0*  WBC 10.6*  PLT  327   Dg Chest 2 View  03/24/2014   CLINICAL DATA:  Back pain  EXAM: CHEST  2 VIEW  COMPARISON:  09/19/2013  FINDINGS: Cardiomediastinal silhouette is stable. There is streaky bilateral basilar airspace disease highly suspicious for infiltrates. Best seen on lateral view. No pulmonary edema. Osteopenia and degenerative changes thoracic spine. There is moderate compression deformity upper lumbar spine of indeterminate age. Clinical correlation is necessary  IMPRESSION: Streaky bilateral basilar airspace disease suspicious for infiltrates. Follow-up to resolution recommended. No pulmonary edema. Moderate compression fracture upper lumbar spine of indeterminate age. Clinical correlation is necessary.   Electronically Signed   By: Lahoma Crocker M.D.   On: 03/24/2014 18:52   Dg Lumbar Spine 2-3 Views  03/24/2014   CLINICAL DATA:  Back pain.  EXAM: LUMBAR SPINE - 2-3 VIEW  COMPARISON:  DG LUMBAR SPINE 2-3V dated 02/11/2014; DG CHEST 2 VIEW dated 03/24/2014; CT ANGIO CHEST W/CM &/OR WO/CM dated 09/17/2013; DG CHEST 2 VIEW dated 01/21/2013  FINDINGS: There are 5 lumbar type vertebral bodies. There is a degenerative grade 1 anterolisthesis at L4-5 which appears stable. As demonstrated on today's chest radiographs, there is a superior endplate compression deformity at L1 which is new compared with the lumbar spine radiographs obtained 6 weeks ago. This results in approximately 15% loss of vertebral body height. The posterior vertebral body cortex appears grossly intact. There is stable mild disc space loss at L2-3. There is facet disease inferiorly.  IMPRESSION: Superior  endplate compression deformity at L1 is new compared with radiographs obtained 6 weeks ago consistent with an acute/subacute injury. Stable anterolisthesis at L4-5.   Electronically Signed   By: Camie Patience M.D.   On: 03/24/2014 20:01   Ct Angio Chest W/cm &/or Wo Cm  03/24/2014   CLINICAL DATA:  Hemoptysis.  Shortness of breath and tachycardia.  EXAM: CT  ANGIOGRAPHY CHEST WITH CONTRAST  TECHNIQUE: Multidetector CT imaging of the chest was performed using the standard protocol during bolus administration of intravenous contrast. Multiplanar CT image reconstructions and MIPs were obtained to evaluate the vascular anatomy.  CONTRAST:  165mL OMNIPAQUE IOHEXOL 350 MG/ML SOLN  COMPARISON:  Chest radiograph performed earlier today at 6:46 p.m., and CTA of the chest performed 09/17/2013  FINDINGS: There is no evidence of pulmonary embolus. Evaluation for pulmonary embolus is mildly suboptimal due to limitations in the timing of the contrast bolus.  Patchy bilateral airspace consolidation is noted within all lobes of both lungs, though most prominent at the lower lung lobes bilaterally. Findings are compatible with diffuse multifocal pneumonia. There is no evidence of pleural effusion or pneumothorax. No masses are identified, though evaluation is suboptimal due to airspace consolidation; no abnormal focal contrast enhancement is seen.  Diffuse coronary artery calcifications are seen. Scattered mediastinal nodes remain normal in size, though visualized retroesophageal nodes are slightly unusual. No pericardial effusion is identified. The great vessels are grossly unremarkable in appearance. No axillary lymphadenopathy is seen. The visualized portions of the thyroid gland are unremarkable in appearance.  The visualized portions of the liver and spleen are unremarkable. The visualized portions of the pancreas, gallbladder, stomach and adrenal glands are within normal limits.  There is a diffusely heterogeneous appearance to the attenuation of visualized osseous structures, somewhat more prominent than on the prior study and new from 2010; would correlate for evidence of underlying systemic condition or possibly metastatic disease. There is a partially healed chronic right fifth posterior rib fracture.  Review of the MIP images confirms the above findings.  IMPRESSION: 1. No  evidence of pulmonary embolus. 2. Relatively diffuse multifocal pneumonia noted bilaterally. 3. Diffuse coronary artery calcifications seen. 4. Diffusely heterogeneous appearance to the attenuation of visualized osseous structures, somewhat more prominent than on the prior study and new from 2010. Would correlate for evidence of underlying systemic condition, or possibly metastatic disease. Bone scan or correlation with lab findings could be considered for further evaluation, as deemed clinically appropriate.   Electronically Signed   By: Garald Balding M.D.   On: 03/24/2014 21:35    ASSESSMENT / PLAN:  68 year old female with PMH of a PE after knee replacement surgery who developed hemoptysis, continued to take her coumadin, presenting coagulopathic and with hemoptysis.  Not frank blood at this point, warned that if changes to frank blood she needs to notify nurse.  Plan: - Still no indication for bronchoscopy thus far. - If hemoptysis transforms to frank blood then may need interventions. - FFP infusion complete, now more crackles, will give a dose of lasix. - Repeat INR now. - No flutter valve for now until coagulopathy is reversed then may use to expectorate residual blood. - Once reversed, no more coumadin, finished 7 months for what is essentially a provoked PE. - Chest CT findings are consistent with dependent infiltrate likely from breathing in blood, however given history would agree with HCAP coverage. - F/U pan cultures. - Please keep in SDU for another 24 hours. - If worsens please  notify PCCM.  Rush Farmer, M.D. [facility] Pulmonary/Critical Care Medicine. Pager: [phone]. After hours pager: [phone].

## 2014-03-25 NOTE — Progress Notes (Signed)
Rehab Admissions Coordinator Note:  Patient was screened by Cleatrice Burke for appropriateness for an Inpatient Acute Rehab Consult per PT recommendations. At this time, we are recommending await further medical and therapy progress to determine rehab venue needs. Pt admitted 03/24/14. I will follow up.Audelia Acton Zetha Kuhar 03/25/2014, 11:19 AM  I can be reached at [phone].

## 2014-03-25 NOTE — H&P (Signed)
I have seen and examined this patient. I have discussed with Dr Christin Bach.  I agree with their findings and plans as documented in their admission note.   Acute Issues 1. Gross Hemoptysis - Excess anticoagulation with Warfarin, INR 6 - Bronchopulmonary infiltrates on Chest CTA - Infection Vs blood aspiration - S/P Anticoagulation reversal with Vit K, FFP ==> INR 1.4 - Pulmonary Service consultation greatly appreciated  2. Hypercalemia - Corrected Serum Calcium = 13.2 g/dL - Symptoms of constipation and back pain - Differential includes  Cancer, Hyperparathyroidism, Multiple Myeloma / Lymphoma, thyroid hormone disorder.  3. Diffuse Heterogenous Bone Appearance on Chest CTA - Concern for bone infiltrative process, especially in setting of hypercalcemia  4. Vertebral Lumbar Fracture - New from 02/13/14 X-ray - Symptomatic with pain radiating around left flank. Patient back pain over last 3 to 4 weeks have impaired her usual independence in iADLS and ADL to dependence on her husband to perform basic ADLs.  - No neuromuscular symptoms in legs - Differential includes osteoporotic fracture vs pathologic fracture   5. Microcytic Anemia - History of ferritin 7 12/09/13 with Hgb 10.1.  Started on iron oral supplement. - No colonoscopic evaluation in record.   6. Acute Blood Loss anemia secondary to #1  Plan - Consultation with Pulmonary Service - Change to Normal Saline IVF.  If serum calcium increases, may need to increase IVF somewhat for effective calciuresis using furosemide to keep from volume overload. Would likely need to use Pamidronate if unable to use forced calciuresis with aggressive Normal Saline infusions because of volume intolerance. - Start Calcitonin Nasal spray daily for vertebral fracture analgesia and help with hypercalcemia.  - Radionucleotide bone scan to evaluate for the presence of metastatic (osteoblastic) bone lesions - Monitor for spinal cord symptoms and signs.   - Monitor Hgb and transfuse for symptoms or hbg < 7.0 g/dL.  - Check: SPEP / UPEP / IFE;  TSH; Anemia Panel; ESR / C-RP. - Continue IV as needed Dilaudid today with goal of pain < 5 most of the time.  Use Morphine daily equivalent dose to figure out oral opiate therapy

## 2014-03-25 NOTE — Progress Notes (Signed)
Family Medicine Teaching Service Daily Progress Note Intern Pager: [phone]  Patient name: [name] Medical record number: [id number] Date of birth: 08-27-1946 Age: 68 y.o. Gender: female  Primary Care Provider: Jenny Reichmann, MD Consultants: PCCM Code Status: full  Pt Overview and Major Events to Date:   Assessment and Plan: [name] is a 68 y.o. female presenting with hemoptysis and back pain of recent onset, causing significant physical impairment. PMH is significant for HTN, DM, STEMI, PE on coumadin  # Pulm: Hemoptysis, Bilateral infiltrates O2 requirement. Patient with recent onset hemoptysis in setting of bilateral infiltrates seen on CTA and supratherapeutic INR to 6 on coumadin. S/p vitK and 4uFFP and 1uPRBCs. Pt hemoglobin stable but not improved 7.0 ->7.4. PCCM consulted and following, no indication for bronch at this time. Pt no longer with active hemoptysis.   -PCCM following, appreciate recommendations and assistance -monitor for development of gross blood -fluid overloaded received 1 dose of lasix this morning, will transfuse another unit of blood and chase with lasix -hold coumadin indefinitely -continue vanc and zosyn coverage for now for CAP consider possible aspiration in light of hemoptysis  # CV: HTN/Tachycardia - likely related to patients anemia and volume depletion  -NS @ 25 -will hold home BP medications at this time given current bleeding and concern that she could become hypotensive   # FENGI: Hypoalbuminemic  -general diet for now -will make NPO if possibility of procedures arises   # Renal: AKI/hyponatremia/hypercalcemia (corrected to 13.2) - likely related to volume depletion; but also concern for pathologic fracture -NS@25  to promote diuresis of Ca -calcitonin nasal sprays  -will trend Cr   # Heme: Supratherapeutic INR, Anemia,Leukocytosis  -will eventually need colonoscopy (no documented record) -cbcs every 4 hours  -trend INR   -iron studies, vitb12/folate  # ID: Bilateral infiltrates concerning for PNA. Leukocytosis  -antibiotics per above  -monitor fever curve  -obtain BCx and UCx (obtained after initiation)  # MSK: Vertebral lumbar spine fracture - likely cause of her recent back pain, normal neurologic exam. Also complaining of recent onset of night sweats -treat current pain with dilaudid (will calculate daily morphine dose based on requirements) -SPEP, UPEP, IFE  -bone scan -monitor for cord sx/compression  Prophylaxis: none given supratherapeutic INR   Disposition: stabilization of hemoptysis and normalization of INR  Subjective: No longer coughing blood this morning; significant amount of pain in lumbar spine (with bilat radiation); unable to perform adls recently  Objective: Temp:  [98 F (36.7 C)-98.8 F (37.1 C)] 98.4 F (36.9 C) (04/08 0648) Pulse Rate:  [97-121] 110 (04/08 0648) Resp:  [14-24] 24 (04/08 0648) BP: (116-175)/(47-106) 154/72 mmHg (04/08 0648) SpO2:  [86 %-100 %] 97 % (04/08 0648) Weight:  [190 lb (86.183 kg)] 190 lb (86.183 kg) (04/07 1407) Physical Exam: General: NAD, resting in bed with husband at bedside HEENT: NCAT, MMM, [place] in place  Cardiovascular: tachycardic, regular rhythm, no murmurs appreciated  Respiratory: crackles throughout, no wheezes, coarse breath sounds, increased WOB Abdomen: soft, NT, ND, no guarding or rebound  Extremities: warm, well perfused, no edema  Skin: pale throughout, no additional rashes  Neuro: alert, PERRL, CN 2-12 intact, 5/5 strength in bilateral biceps, triceps, grip, hip flexion, quads, plantar and dorsiflexion, sensation to light touch intact, 2+ patellar reflexes, achilles reflexes, FTN, RAM intact MSK: tender in lumbar spine on deep palpation, no paraspinous tenderness  Laboratory:  Recent Labs Lab 03/24/14 1905  WBC 10.6*  HGB 7.4*  HCT 23.0*  PLT 327    Recent Labs Lab 03/24/14 1905  NA 134*  K 4.8  CL 92*  CO2  23  BUN 28*  CREATININE 1.14*  CALCIUM 12.3*  PROT 11.2*  BILITOT 0.4  ALKPHOS 70  ALT 9  AST 36  GLUCOSE 111*    INR 6.04 UA large blood  Imaging/Diagnostic Tests: CT angio IMPRESSION: 1. No evidence of pulmonary embolus. 2. Relatively diffuse multifocal pneumonia noted bilaterally. 3. Diffuse coronary artery calcifications seen. 4. Diffusely heterogeneous appearance to the attenuation of visualized osseous structures, somewhat more prominent than on the prior study and new from 2010. Would correlate for evidence of underlying systemic condition, or possibly metastatic disease. Bone scan or correlation with lab findings could be considered for further evaluation, as deemed clinically appropriate.   Sue Masker, MD 03/25/2014, 6:59 AM PGY-1, [place] Intern pager: [phone], text pages welcome

## 2014-03-25 NOTE — Evaluation (Signed)
Physical Therapy Evaluation Patient Details Name: [name] MRN: [id number] DOB: Dec 02, 1946 Today's Date: 03/25/2014   History of Present Illness  [name] is a 68 y.o. female presenting with hemoptysis and back pain. PMH is significant for HTN, DM, STEMI, PE on coumadin. INR >6 on adm  Clinical Impression  Pt admitted with back pain and hemoptysis. Pt currently severely limited in mobility due to pain. If pain better controlled, feel she could do well with CIR (good family support). Pt currently with functional limitations due to the deficits listed below (see PT Problem List).  Pt will benefit from skilled PT to increase their independence and safety with mobility to allow discharge to the venue listed below.       Follow Up Recommendations CIR (if pain control improved) ;Supervision for mobility/OOB    Equipment Recommendations  None recommended by PT    Recommendations for Other Services OT consult     Precautions / Restrictions Precautions Precautions: Fall Restrictions Weight Bearing Restrictions: No      Mobility  Bed Mobility Overal bed mobility: Needs Assistance Bed Mobility: Rolling;Sidelying to Sit;Sit to Sidelying Rolling: Supervision Sidelying to sit: Mod assist;HOB elevated     Sit to sidelying: Min assist General bed mobility comments: vc for technique to minimize back pain; pain limiting effort and required assist  Transfers Overall transfer level: Needs assistance Equipment used: Rolling walker (2 wheeled) Transfers: Sit to/from Stand Sit to Stand: Min assist         General transfer comment: ICU bed at lowest height  Ambulation/Gait Ambulation/Gait assistance: Min assist Ambulation Distance (Feet): 1 Feet Assistive device: Rolling walker (2 wheeled)       General Gait Details: side step to her Lt to [place]            Wheelchair Mobility    Modified Rankin (Stroke Patients Only)       Balance Overall  balance assessment: Needs assistance Sitting-balance support: Feet supported;No upper extremity supported Sitting balance-Leahy Scale: Fair Sitting balance - Comments: EOB total of 8 minutes   Standing balance support: Bilateral upper extremity supported Standing balance-Leahy Scale: Poor Standing balance comment: with RW                             Pertinent Vitals/Pain 8/10 back pain at rest; 10/10 with activity; RN provided medication to assist with pain control SaO2 on 6L 91-94% HR 105-130    Home Living Family/patient expects to be discharged to:: Private residence Living Arrangements: Spouse/significant other Available Help at Discharge: Family;Available 24 hours/day Type of Home: House Home Access: Ramped entrance;Stairs to enter     Home Layout: One level Home Equipment: [place] - 2 wheels;Bedside commode;Cane - single point;Wheelchair - manual;Hospital bed      Prior Function Level of Independence: Needs assistance   Gait / Transfers Assistance Needed: using RW could walk household distances; using RW x 2 weeks due to back pain; at times husband pushed in w/c due to pain (mostly in past week)           Hand Dominance        Extremity/Trunk Assessment   Upper Extremity Assessment: Generalized weakness           Lower Extremity Assessment: Generalized weakness (DF 5/5 bil)      Cervical / Trunk Assessment: Normal  Communication   Communication: No difficulties  Cognition Arousal/Alertness: Awake/alert Behavior During Therapy: WFL for  tasks assessed/performed Overall Cognitive Status: Within Functional Limits for tasks assessed                      General Comments General comments (skin integrity, edema, etc.): Discussed importance of activity related to pna and generalized strength. Pt does not feel she can go home in current condition    Exercises General Exercises - Lower Extremity Ankle Circles/Pumps: AROM;Both;10  reps;Supine      Assessment/Plan    PT Assessment Patient needs continued PT services  PT Diagnosis Difficulty walking;Acute pain;Generalized weakness   PT Problem List Decreased strength;Decreased activity tolerance;Decreased balance;Decreased mobility;Decreased knowledge of use of DME;Cardiopulmonary status limiting activity;Pain  PT Treatment Interventions DME instruction;Gait training;Functional mobility training;Therapeutic activities;Therapeutic exercise;Patient/family education   PT Goals (Current goals can be found in the Care Plan section) Acute Rehab PT Goals Patient Stated Goal: be able to manage her pain and walk again PT Goal Formulation: With patient Time For Goal Achievement: 04/08/14 Potential to Achieve Goals: Good    Frequency Min 3X/week   Barriers to discharge        Co-evaluation               End of Session Equipment Utilized During Treatment: Gait belt;Oxygen Activity Tolerance: Patient limited by pain;Treatment limited secondary to medical complications (Comment) (tachycardia) Patient left: in bed;with call bell/phone within reach;with family/visitor present Nurse Communication: Mobility status         Time: 6754-4920 PT Time Calculation (min): 39 min   Charges:   PT Evaluation $Initial PT Evaluation Tier I: 1 Procedure PT Treatments $Therapeutic Activity: 23-37 mins   PT G CodesJeanie Cooks Abhiraj Dozal 04/04/2014, 11:06 AM Pager [phone]

## 2014-03-26 ENCOUNTER — Other Ambulatory Visit: Payer: Self-pay

## 2014-03-26 DIAGNOSIS — D649 Anemia, unspecified: Secondary | ICD-10-CM

## 2014-03-26 DIAGNOSIS — D638 Anemia in other chronic diseases classified elsewhere: Secondary | ICD-10-CM

## 2014-03-26 DIAGNOSIS — I82409 Acute embolism and thrombosis of unspecified deep veins of unspecified lower extremity: Secondary | ICD-10-CM

## 2014-03-26 DIAGNOSIS — D509 Iron deficiency anemia, unspecified: Secondary | ICD-10-CM

## 2014-03-26 DIAGNOSIS — D500 Iron deficiency anemia secondary to blood loss (chronic): Secondary | ICD-10-CM

## 2014-03-26 LAB — URINE CULTURE
COLONY COUNT: NO GROWTH
Culture: NO GROWTH

## 2014-03-26 LAB — TYPE AND SCREEN
ABO/RH(D): B POS
Antibody Screen: NEGATIVE
Unit division: 0
Unit division: 0

## 2014-03-26 LAB — PTH, INTACT AND CALCIUM
CALCIUM TOTAL (PTH): 11.1 mg/dL — AB (ref 8.4–10.5)
PTH: 4.4 pg/mL — ABNORMAL LOW (ref 14.0–72.0)

## 2014-03-26 LAB — BASIC METABOLIC PANEL
BUN: 28 mg/dL — ABNORMAL HIGH (ref 6–23)
BUN: 30 mg/dL — ABNORMAL HIGH (ref 6–23)
CALCIUM: 13.2 mg/dL — AB (ref 8.4–10.5)
CHLORIDE: 90 meq/L — AB (ref 96–112)
CO2: 28 mEq/L (ref 19–32)
CO2: 30 mEq/L (ref 19–32)
Chloride: 92 mEq/L — ABNORMAL LOW (ref 96–112)
Creatinine, Ser: 1.62 mg/dL — ABNORMAL HIGH (ref 0.50–1.10)
Creatinine, Ser: 1.65 mg/dL — ABNORMAL HIGH (ref 0.50–1.10)
GFR calc non Af Amer: 32 mL/min — ABNORMAL LOW (ref >90)
GFR, EST AFRICAN AMERICAN: 37 mL/min — AB (ref >90)
GFR, EST AFRICAN AMERICAN: 36 mL/min — AB (ref >90)
GFR, EST NON AFRICAN AMERICAN: 31 mL/min — AB (ref >90)
Glucose, Bld: 112 mg/dL — ABNORMAL HIGH (ref 70–99)
Glucose, Bld: 182 mg/dL — ABNORMAL HIGH (ref 70–99)
Potassium: 4.1 mEq/L (ref 3.7–5.3)
Potassium: 4.7 mEq/L (ref 3.7–5.3)
SODIUM: 138 meq/L (ref 137–147)
SODIUM: 137 meq/L (ref 137–147)

## 2014-03-26 LAB — PROTIME-INR
INR: 1.41 (ref 0.00–1.49)
Prothrombin Time: 16.9 seconds — ABNORMAL HIGH (ref 11.6–15.2)

## 2014-03-26 LAB — GLUCOSE, CAPILLARY
GLUCOSE-CAPILLARY: 163 mg/dL — AB (ref 70–99)
GLUCOSE-CAPILLARY: 148 mg/dL — AB (ref 70–99)
Glucose-Capillary: 151 mg/dL — ABNORMAL HIGH (ref 70–99)
Glucose-Capillary: 226 mg/dL — ABNORMAL HIGH (ref 70–99)

## 2014-03-26 LAB — CBC
HCT: 25.4 % — ABNORMAL LOW (ref 36.0–46.0)
Hemoglobin: 8.3 g/dL — ABNORMAL LOW (ref 12.0–15.0)
MCH: 26.6 pg (ref 26.0–34.0)
MCHC: 32.7 g/dL (ref 30.0–36.0)
MCV: 81.4 fL (ref 78.0–100.0)
PLATELETS: 284 10*3/uL (ref 150–400)
RBC: 3.12 MIL/uL — ABNORMAL LOW (ref 3.87–5.11)
RDW: 19.1 % — ABNORMAL HIGH (ref 11.5–15.5)
WBC: 8.6 10*3/uL (ref 4.0–10.5)

## 2014-03-26 LAB — PREPARE FRESH FROZEN PLASMA
UNIT DIVISION: 0
Unit division: 0
Unit division: 0
Unit division: 0

## 2014-03-26 LAB — MAGNESIUM: MAGNESIUM: 1.9 mg/dL (ref 1.5–2.5)

## 2014-03-26 LAB — C-REACTIVE PROTEIN: CRP: 5.9 mg/dL — ABNORMAL HIGH (ref <0.60)

## 2014-03-26 LAB — PHOSPHORUS: PHOSPHORUS: 5.5 mg/dL — AB (ref 2.3–4.6)

## 2014-03-26 LAB — VITAMIN D 25 HYDROXY (VIT D DEFICIENCY, FRACTURES): VIT D 25 HYDROXY: 25 ng/mL — AB (ref 30–89)

## 2014-03-26 LAB — SAVE SMEAR

## 2014-03-26 LAB — SEDIMENTATION RATE

## 2014-03-26 MED ORDER — FERROUS SULFATE 325 (65 FE) MG PO TABS
325.0000 mg | ORAL_TABLET | Freq: Every day | ORAL | Status: DC
  Filled 2014-03-26 (×2): qty 1

## 2014-03-26 MED ORDER — LEVOFLOXACIN 750 MG PO TABS
750.0000 mg | ORAL_TABLET | ORAL | Status: DC
  Administered 2014-03-26 – 2014-03-30 (×3): 750 mg via ORAL
  Filled 2014-03-26 (×3): qty 1

## 2014-03-26 MED ORDER — METOPROLOL TARTRATE 12.5 MG HALF TABLET
12.5000 mg | ORAL_TABLET | Freq: Two times a day (BID) | ORAL | Status: DC
  Administered 2014-03-26: 12.5 mg via ORAL
  Filled 2014-03-26: qty 1

## 2014-03-26 MED ORDER — POLYETHYLENE GLYCOL 3350 17 G PO PACK
17.0000 g | PACK | Freq: Two times a day (BID) | ORAL | Status: DC
  Administered 2014-03-26 – 2014-03-27 (×4): 17 g via ORAL
  Filled 2014-03-26 (×7): qty 1

## 2014-03-26 MED ORDER — SODIUM CHLORIDE 0.9 % IV SOLN
INTRAVENOUS | Status: AC
  Administered 2014-03-26: 16:00:00 via INTRAVENOUS
  Administered 2014-03-27 (×2): 200 mL via INTRAVENOUS

## 2014-03-26 MED ORDER — ZOLEDRONIC ACID 4 MG/5ML IV CONC
3.0000 mg | Freq: Once | INTRAVENOUS | Status: AC
  Administered 2014-03-26: 3 mg via INTRAVENOUS
  Filled 2014-03-26: qty 3.75

## 2014-03-26 MED ORDER — FUROSEMIDE 10 MG/ML IJ SOLN
40.0000 mg | Freq: Once | INTRAMUSCULAR | Status: AC
  Administered 2014-03-26: 40 mg via INTRAVENOUS
  Filled 2014-03-26: qty 4

## 2014-03-26 MED ORDER — SENNOSIDES-DOCUSATE SODIUM 8.6-50 MG PO TABS
2.0000 | ORAL_TABLET | Freq: Two times a day (BID) | ORAL | Status: DC
  Administered 2014-03-26 – 2014-03-28 (×3): 2 via ORAL
  Filled 2014-03-26 (×5): qty 2

## 2014-03-26 MED ORDER — CALCITONIN (SALMON) 200 UNIT/ML IJ SOLN
100.0000 [IU] | Freq: Every day | INTRAMUSCULAR | Status: DC
  Administered 2014-03-26: 100 [IU] via INTRAMUSCULAR
  Filled 2014-03-26 (×2): qty 0.5

## 2014-03-26 NOTE — Progress Notes (Signed)
Notified Md about pts Hr 120's -130 ST. Obtaining EKG pt asymptomatic.  Awaiting call back. Will continue to monitor. [name]

## 2014-03-26 NOTE — Progress Notes (Signed)
Family Medicine Teaching Service Daily Progress Note Intern Pager: [phone]  Patient name: [name] Medical record number: [id number] Date of birth: [birth year] Age: 68 y.o. Gender: female  Primary Care Provider: Jenny Reichmann, MD Consultants: PCCM Code Status: full  Pt Overview and Major Events to Date:   Assessment and Plan: JIMMA ORTMAN is a 68 y.o. female presenting with hemoptysis and back pain of recent onset, causing significant physical impairment. PMH is significant for HTN, DM, STEMI, PE on coumadin  # Pulm: Hemoptysis, Bilateral infiltrates O2 requirement. CAP. Patient with recent onset hemoptysis in setting of bilateral infiltrates seen on CTA and supratherapeutic INR to 6 on coumadin. S/p vitK and 4uFFP and 1uPRBCs. Pt hemoglobin improved after second unit PRBCs to 8.3. INR 1.41 this morning. -PCCM following, appreciate recommendations and assistance -monitor for development of gross blood -cont to assess for fluid overload -hold coumadin indefinitely -switch to levaquin (s/p vanc/zosyn 4/7-4/9) and consider broading for  Aspiration PNA in light of hemoptysis if pt decompensates   # CV: HTN/Tachycardia - likely related to patients anemia and volume depletion  -will hold home BP medications at this time given current bleeding and concern that she could become hypotensive   # FENGI: Hypoalbuminemic; constipated -general diet for now -will make NPO if possibility of procedures arises  -senna-docusate/miralax  # Renal: AK (Cr 1.62)/hypercalcemia(now greater than 15, and ical 1.66)- extremely concerning for pathologic etiology -NS@200  to promote diuresis of Ca, calcitonin 100U, zometa 4mg  -will trend Cr   # Heme: Supratherapeutic INR improved, Anemia (8.5->8.3),Leukocytosis; iron studies relatively unremarkable; Concern for malignant process (see below) -heme/onc consulted, appreciate recommendations/managament  -will eventually need colonoscopy (no documented  record) -cbcs daily  -trend INR   # ID: Bilateral infiltrates concerning for PNA. Leukocytosis  -antibiotics per above  -monitor fever curve  -f/up BCx and UCx (obtained after initiation)  # MSK: Vertebral lumbar spine fracture - likely cause of her recent back pain, normal neurologic exam. Also complaining of recent onset of night sweats; bone scan suggestive of malignancy -treat current pain with dilaudid (will calculate daily morphine dose based on requirements), would cont with dilaudid for now given severity of pain -SPEP, UPEP, IFE  -monitor for cord sx/compression  Prophylaxis: none given supratherapeutic INR  Disposition: stabilization of hemoptysis, further work up of hypercalcemia, likely malignancy   Subjective: Still coughing up some blood this morning small chunks intermittently, no gross hemoptysis; having back pain that is fairly well controlled with dilaudid   Objective: Temp:  [97.8 F (36.6 C)-98.8 F (37.1 C)] 97.8 F (36.6 C) (04/09 0400) Pulse Rate:  [101-125] 111 (04/09 0600) Resp:  [14-25] 20 (04/09 0600) BP: (102-165)/(46-118) 109/58 mmHg (04/09 0600) SpO2:  [90 %-99 %] 91 % (04/09 0600) Physical Exam: General: NAD, resting in bed with multiple family members surrounding HEENT: NCAT, MMM, [place] in place  Cardiovascular: tachycardic, regular rhythm, no murmurs appreciated  Respiratory: crackles throughout, with coarse breath sounds, increased WOB requiring [place] Abdomen: soft, NT, ND, no guarding or rebound  Extremities: warm, well perfused, no edema  Skin: pale throughout, no additional rashes  Neuro: alert, PERRL, CN 2-12 intact, 5/5 strength in bilateral biceps, triceps, grip, hip flexion, quads, plantar and dorsiflexion, sensation to light touch intact, 2+ patellar reflexes, achilles reflexes MSK: tender in lumbar spine on deep palpation, no paraspinous tenderness  Laboratory:  Recent Labs Lab 03/25/14 1100 03/25/14 2001 03/26/14 0309  WBC 9.4 9.4  8.6  HGB 7.4* 8.5* 8.3*  HCT 23.2*  26.0* 25.4*  PLT 285 275 284    Recent Labs Lab 03/24/14 1905 03/25/14 0850 03/26/14 0309  NA 134* 138 138  K 4.8 4.2 4.1  CL 92* 91* 90*  CO2 23 28 28   BUN 28* 24* 28*  CREATININE 1.14* 1.24* 1.62*  CALCIUM 12.3* 12.4* >15.0*  PROT 11.2*  --   --   BILITOT 0.4  --   --   ALKPHOS 70  --   --   ALT 9  --   --   AST 36  --   --   GLUCOSE 111* 92 112*    INR 6.04 UA large blood  Imaging/Diagnostic Tests: CT angio IMPRESSION: 1. No evidence of pulmonary embolus. 2. Relatively diffuse multifocal pneumonia noted bilaterally. 3. Diffuse coronary artery calcifications seen. 4. Diffusely heterogeneous appearance to the attenuation of visualized osseous structures, somewhat more prominent than on the prior study and new from 2010. Would correlate for evidence of underlying systemic condition, or possibly metastatic disease. Bone scan or correlation with lab findings could be considered for further evaluation, as deemed clinically appropriate.  Bone scan IMPRESSION:  1. Findings consistent with old right posterior fifth and left  posterior ninth rib fractures. No evidence of metastatic disease.  2. Given the mottled appearance of the bony structures on recent  chest CT, to evaluate for myeloma serum protein electrophoresis  should be considered  Langston Masker, MD 03/26/2014, 7:17 AM PGY-1, [place] Intern pager: [phone], text pages welcome

## 2014-03-26 NOTE — Progress Notes (Signed)
Rehab Admissions Coordinator Note:  Patient was screened by Cleatrice Burke for appropriateness for an Inpatient Acute Rehab Consult.  At this time, we are recommending Inpatient Rehab consult. Please place order per PT and OT recommendations.   Audelia Acton [facility] 03/26/2014, 1:17 PM  I can be reached at [phone].

## 2014-03-26 NOTE — ED Provider Notes (Signed)
I saw and evaluated the patient, reviewed the resident's note and I agree with the findings and plan.   EKG Interpretation None      Patient here with hemoptysis. On blood thinners for DVT. Frank hemoptysis time in the room. Also satting in the mid 80s with tachycardia to the 110s. Concern for possible PE. Labs show INR supratherapeutic at 6. CT shows multifocal pneumonia. Vitamin K given to reduce INR. No need for urgent or emergent reversal. Admitted.  Osvaldo Shipper, MD 03/26/14 2259

## 2014-03-26 NOTE — Progress Notes (Signed)
I have seen and examined this patient. I have discussed with Dr Marsh.  I agree with their findings and plans as documented in their progress note.    

## 2014-03-26 NOTE — Progress Notes (Signed)
I have seen and examined this patient. I have discussed with Dr Langston Masker.  I agree with their findings and plans as documented in their progress note.   Acute Issues 1. Hemoptysis - Improving 2. CAP and Suspected Aspiration Pneumonia - BS anbitioitic coverage currently, will narrow to levaquin for total of 8 days therapy if patient continues to improve and cultures and CHEST XRAY show no progress.   3. Hypercalcemia - Start Normal Saline forced Calciuresis - Start IM Calcitonin - Start IV Bisphosphonate with either Zoledronic Acid renally or pamidronate renally dosed.  - Awaiting SPEP / UPEP / IFE. Bone Scan with no evidence of osteoblastic lesions.

## 2014-03-26 NOTE — Clinical Documentation Improvement (Signed)
   Patient currently being treated for CAP with Vanc and Zosyn  "Possible Aspiration in light of hemoptysis" also documented in progress notes   Please clarify the Suspected and/or Known Type of Pneumonia currently being treated:   - CAP and Suspected Aspiration Pneumonia   - CAP only   - Suspected Aspiration Pneumonia only.   Thank You,  Erling Conte ,RN BSN CCDS Certified Clinical Documentation Specialist:  [facility]

## 2014-03-26 NOTE — Progress Notes (Signed)
Still spitting dark blood in minimal amount on and off.

## 2014-03-26 NOTE — Progress Notes (Signed)
Physical Therapy Treatment Patient Details Name: [name] MRN: [id number] DOB: [birth year] Today's Date: 03/26/2014    History of Present Illness [name] is a 68 y.o. female presenting with hemoptysis and back pain. PMH is significant for HTN, DM, STEMI, PE on coumadin. INR >6 on adm.  Possible malignancy noted on bone scan.  Oncologist to address.    PT Comments    Pt again pre-medicated with IV pain medicine prior to activity. Pt remains limited by back pain, however was able to walk into/out of bathroom. She noted decr back pain with use of tighter gait belt around her back. States she has a lumbar corset at home and will ask her husband to bring it in. Tachycardia also remains an issue (?how much related to incr pain, as HR quickly reduces to pre-activity level once she is resting).   Follow Up Recommendations  CIR;Supervision for mobility/OOB     Equipment Recommendations  None recommended by PT    Recommendations for Other Services Rehab consult     Precautions / Restrictions Precautions Precautions: Fall Required Braces or Orthoses: Other Brace/Splint Other Brace/Splint: Pt has a back corset at home she may want to try just for pain relief.  While walking pt commented it felt better when gait belt was tighter.  Discuessed corset and left note for MD to see if ok for pt to bring in and use. Restrictions Weight Bearing Restrictions: No    Mobility  Bed Mobility Overal bed mobility: Needs Assistance Bed Mobility: Rolling;Sidelying to Sit;Sit to Sidelying Rolling: Supervision Sidelying to sit: min assist;HOB elevated       General bed mobility comments: vc for technique to minimize back pain; pain limiting effort and required assist  Transfers Overall transfer level: Needs assistance Equipment used: Rolling walker (2 wheeled) Transfers: Sit to/from [facility] Sit to Stand: Min assist Stand pivot transfers: Min assist;+2  safety/equipment       General transfer comment: Cues for hand placement needed on all transfers.  Ambulation/Gait Ambulation/Gait assistance: Min guard Ambulation Distance (Feet): 25 Feet (15, seated/toilet; 10) Assistive device: Rolling walker (2 wheeled) Gait Pattern/deviations: Step-through pattern;Decreased stride length   Gait velocity interpretation: <1.8 ft/sec, indicative of risk for recurrent falls General Gait Details: incr bil UE support due to back pain; pt initially asking to use a cane (she typically prefers), however she is stabilizing with bil UEs on RW due to pain   Stairs            Wheelchair Mobility    Modified Rankin (Stroke Patients Only)       Balance Overall balance assessment: Needs assistance Sitting-balance support: Bilateral upper extremity supported;Feet supported Sitting balance-Leahy Scale: Fair Sitting balance - Comments: EOB about 4 minutes   Standing balance support: Bilateral upper extremity supported;During functional activity Standing balance-Leahy Scale: Poor Standing balance comment: pt with so much pain she really needs walker for stability                     Cognition Arousal/Alertness: Awake/alert Behavior During Therapy: WFL for tasks assessed/performed Overall Cognitive Status: Within Functional Limits for tasks assessed                      Exercises      General Comments General comments (skin integrity, edema, etc.): Discussed to the need to attempt to stay up in chair for about an hour if possible due to pna.  Pertinent Vitals/Pain 8-10/10 low back pain with activity; 7/10 once seated and resting; RN provided medication to assist with pain control     Home Living Family/patient expects to be discharged to:: Private residence Living Arrangements: Spouse/significant other Available Help at Discharge: Family;Available 24 hours/day Type of Home: House Home Access: Ramped entrance;Stairs to  enter Entrance Stairs-Rails: Right Home Layout: One level Home Equipment: Walker - 2 wheels;Bedside commode;Cane - single point;Wheelchair - manual;Hospital bed Additional Comments: Pt has been sponge bathing for last 3 weeks due to pain.    Prior Function Level of Independence: Needs assistance  Gait / Transfers Assistance Needed: using RW could walk household distances; using RW x 2 weeks due to back pain; at times husband pushed in w/c due to pain (mostly in past week) ADL's / Homemaking Assistance Needed: Pt was I with most adls 3 weeks ago but now sponges bathes only and husband assists wtih LE bathing and dressing.     PT Goals (current goals can now be found in the care plan section) Acute Rehab PT Goals Patient Stated Goal: be able to manage her pain and walk again Progress towards PT goals: Progressing toward goals    Frequency  Min 3X/week    PT Plan Current plan remains appropriate    Co-evaluation PT/OT/SLP Co-Evaluation/Treatment: Yes Reason for Co-Treatment: Complexity of the patient's impairments (multi-system involvement);For patient/therapist safety PT goals addressed during session: Mobility/safety with mobility;Balance;Proper use of DME OT goals addressed during session: ADL's and self-care     End of Session Equipment Utilized During Treatment: Gait belt;Oxygen Activity Tolerance: Patient limited by fatigue;Patient limited by pain Patient left: in chair;with call bell/phone within reach     Time: 2694-8546 PT Time Calculation (min): 28 min  Charges:  $Gait Training: 8-22 mins                    G Codes:      [name] [name] 2014/04/25, 11:25 AM Pager [phone]

## 2014-03-26 NOTE — Progress Notes (Signed)
Interim Progress Note:   S: Went to evaluate patients volume status with continued IVF for treatment of hypercalcemia. She complains of continued back pain but denies CP or SOB.    O:  BP 169/66  Pulse 130  Temp(Src) 98.2 F (36.8 C) (Oral)  Resp 18  Ht 5\' 3"  (1.6 m)  Wt 190 lb (86.183 kg)  BMI 33.67 kg/m2  SpO2 99% General: alert, cooperative and no distress Heart: S1, S2 normal, no murmur, rub or gallop, regular rate and rhythm Lungs: clear to auscultation, no wheezes or rales and unlabored breathing Abdomen: abdomen is soft without significant tenderness, masses, organomegaly or guarding Extremities: extremities normal, atraumatic, no cyanosis or edema Neurology: A&Ox4   A/P  Hypercalcemia  - No signs of volume overload; Continue IVF at current rate - Will not re-dose Lasix given no signs of volume overload and AKI, rising CR 1.65 today > 1.14 (admit; 4/7)  Sinus Tachycardia and HTN - BP 150-170/66-101; HR 130s - Likely due to anemia & holding Beta-blocker on admission due to concern for hypotension if bleed worsened.  - Hgb stable ~ 8.3; Hemoptysis improving   - EKG: Sinus Tachycardia - Restart Metoprolol at decreased dose: Metoprolol tartrate 12.5 BID (Home dose Meto XL 25mg )  Phill Myron,  [facility] Medicine PGY-1 Resident

## 2014-03-26 NOTE — Progress Notes (Signed)
Calcitonin and zumeta been notified for and according to pharmacist both med. needs  verification from hematologist. Awaiting availability of said meds.

## 2014-03-26 NOTE — Progress Notes (Signed)
Name: [name] MRN: [id number] DOB: 24-Nov-1946    ADMISSION DATE:  03/24/2014 CONSULTATION DATE:  03/24/2014  REFERRING MD :  FPTS PRIMARY SERVICE:  FPTS  CHIEF COMPLAINT:  Hemoptysis  BRIEF PATIENT DESCRIPTION: 68 year old female with PMH of PE post left knee replacement who has been on coumadin with no recent change in her dosing, who noticed hemoptysis two days PTP.  On the night of the seventh noticed that hemoptysis was getting much worse and family forced her to come to the ED.  In the ED was noted to have moderate amount of blood mixed in with her sputum however was also noted to have an INR of 6.  Reports that hemoptysis is worsening but the consistency has been that of phlegm.  She continued to take her coumadin inspite of hemoptysis.  SIGNIFICANT EVENTS / STUDIES:  4/7>>>Hemoptysis.  LINES / TUBES: PIV  CULTURES: Blood 4/7>>> Urine 4/7>>> Sputum 4/7>>>  ANTIBIOTICS: Vancomycin 4/7>>> Zosyn 4/7>>>  SUBJECTIVE: Hemoptysis continued but patient reports it is brown and black material mixed with sputum and more thick, no bright red blood.  VITAL SIGNS: Temp:  [97.8 F (36.6 C)-98.8 F (37.1 C)] 98.8 F (37.1 C) (04/09 0752) Pulse Rate:  [101-144] 144 (04/09 1100) Resp:  [14-25] 21 (04/09 1000) BP: (102-162)/(46-118) 149/78 mmHg (04/09 1000) SpO2:  [90 %-99 %] 94 % (04/09 1100)  PHYSICAL EXAMINATION: General:  Chronically ill appearing female, NAD. Neuro:  Alert and interactive, following all commands. HEENT:  [place]/AT, PERRL, EOM-I and MMM, blood on tongue. Neck:  Supple, -LAN and -thyromegally. Cardiovascular:  RRR, Nl S1/S2, -M/R/G. Lungs:  Bibasilar crackles, worsened overnight. Abdomen:  Soft, NT, ND and +BS. Musculoskeletal:  -edema and -tenderness. Skin:  Intact.  Recent Labs Lab 03/24/14 1905 03/25/14 0850 03/26/14 0309  NA 134* 138 138  K 4.8 4.2 4.1  CL 92* 91* 90*  CO2 23 28 28   BUN 28* 24* 28*  CREATININE 1.14* 1.24* 1.62*  GLUCOSE  111* 92 112*    Recent Labs Lab 03/25/14 1100 03/25/14 2001 03/26/14 0309  HGB 7.4* 8.5* 8.3*  HCT 23.2* 26.0* 25.4*  WBC 9.4 9.4 8.6  PLT 285 275 284   Dg Chest 2 View  03/24/2014   CLINICAL DATA:  Back pain  EXAM: CHEST  2 VIEW  COMPARISON:  09/19/2013  FINDINGS: Cardiomediastinal silhouette is stable. There is streaky bilateral basilar airspace disease highly suspicious for infiltrates. Best seen on lateral view. No pulmonary edema. Osteopenia and degenerative changes thoracic spine. There is moderate compression deformity upper lumbar spine of indeterminate age. Clinical correlation is necessary  IMPRESSION: Streaky bilateral basilar airspace disease suspicious for infiltrates. Follow-up to resolution recommended. No pulmonary edema. Moderate compression fracture upper lumbar spine of indeterminate age. Clinical correlation is necessary.   Electronically Signed   By: Lahoma Crocker M.D.   On: 03/24/2014 18:52   Dg Lumbar Spine 2-3 Views  03/24/2014   CLINICAL DATA:  Back pain.  EXAM: LUMBAR SPINE - 2-3 VIEW  COMPARISON:  DG LUMBAR SPINE 2-3V dated 02/11/2014; DG CHEST 2 VIEW dated 03/24/2014; CT ANGIO CHEST W/CM &/OR WO/CM dated 09/17/2013; DG CHEST 2 VIEW dated 01/21/2013  FINDINGS: There are 5 lumbar type vertebral bodies. There is a degenerative grade 1 anterolisthesis at L4-5 which appears stable. As demonstrated on today's chest radiographs, there is a superior endplate compression deformity at L1 which is new compared with the lumbar spine radiographs obtained 6 weeks ago. This results in approximately 15%  loss of vertebral body height. The posterior vertebral body cortex appears grossly intact. There is stable mild disc space loss at L2-3. There is facet disease inferiorly.  IMPRESSION: Superior endplate compression deformity at L1 is new compared with radiographs obtained 6 weeks ago consistent with an acute/subacute injury. Stable anterolisthesis at L4-5.   Electronically Signed   By: Camie Patience  M.D.   On: 03/24/2014 20:01   Ct Angio Chest W/cm &/or Wo Cm  03/24/2014   CLINICAL DATA:  Hemoptysis.  Shortness of breath and tachycardia.  EXAM: CT ANGIOGRAPHY CHEST WITH CONTRAST  TECHNIQUE: Multidetector CT imaging of the chest was performed using the standard protocol during bolus administration of intravenous contrast. Multiplanar CT image reconstructions and MIPs were obtained to evaluate the vascular anatomy.  CONTRAST:  183mL OMNIPAQUE IOHEXOL 350 MG/ML SOLN  COMPARISON:  Chest radiograph performed earlier today at 6:46 p.m., and CTA of the chest performed 09/17/2013  FINDINGS: There is no evidence of pulmonary embolus. Evaluation for pulmonary embolus is mildly suboptimal due to limitations in the timing of the contrast bolus.  Patchy bilateral airspace consolidation is noted within all lobes of both lungs, though most prominent at the lower lung lobes bilaterally. Findings are compatible with diffuse multifocal pneumonia. There is no evidence of pleural effusion or pneumothorax. No masses are identified, though evaluation is suboptimal due to airspace consolidation; no abnormal focal contrast enhancement is seen.  Diffuse coronary artery calcifications are seen. Scattered mediastinal nodes remain normal in size, though visualized retroesophageal nodes are slightly unusual. No pericardial effusion is identified. The great vessels are grossly unremarkable in appearance. No axillary lymphadenopathy is seen. The visualized portions of the thyroid gland are unremarkable in appearance.  The visualized portions of the liver and spleen are unremarkable. The visualized portions of the pancreas, gallbladder, stomach and adrenal glands are within normal limits.  There is a diffusely heterogeneous appearance to the attenuation of visualized osseous structures, somewhat more prominent than on the prior study and new from 2010; would correlate for evidence of underlying systemic condition or possibly metastatic  disease. There is a partially healed chronic right fifth posterior rib fracture.  Review of the MIP images confirms the above findings.  IMPRESSION: 1. No evidence of pulmonary embolus. 2. Relatively diffuse multifocal pneumonia noted bilaterally. 3. Diffuse coronary artery calcifications seen. 4. Diffusely heterogeneous appearance to the attenuation of visualized osseous structures, somewhat more prominent than on the prior study and new from 2010. Would correlate for evidence of underlying systemic condition, or possibly metastatic disease. Bone scan or correlation with lab findings could be considered for further evaluation, as deemed clinically appropriate.   Electronically Signed   By: Garald Balding M.D.   On: 03/24/2014 21:35   Nm Bone Scan Whole Body  03/25/2014   CLINICAL DATA:  Back pain.  EXAM: NUCLEAR MEDICINE WHOLE BODY BONE SCAN  TECHNIQUE: Whole body anterior and posterior images were obtained approximately 3 hours after intravenous injection of radiopharmaceutical.  RADIOPHARMACEUTICALS:  25 mCi Technetium-99 MDP  COMPARISON:  Chest CT 03/24/2014.  FINDINGS: Punctate area of increased activity noted over approximate the right posterior fifth and a left posterior ninth ribs. These correspond to old rib fractures. Given the mottled appearance of the bony structures of the chest on recent chest CT, myeloma should be considered and serum protein electrophoresis should be considered.  IMPRESSION: 1. Findings consistent with old right posterior fifth and left posterior ninth rib fractures. No evidence of metastatic disease. 2. Given the mottled  appearance of the bony structures on recent chest CT, to evaluate for myeloma serum protein electrophoresis should be considered.   Electronically Signed   By: Marcello Moores  Register   On: 03/25/2014 16:11    ASSESSMENT / PLAN:  68 year old female with PMH of a PE after knee replacement surgery who developed hemoptysis, continued to take her coumadin, presenting  coagulopathic and with hemoptysis.  Old blood at this point.  Plan: - Still no indication for bronchoscopy thus far. - If hemoptysis transforms to frank blood then may need interventions. - No further anti-coagulation. - IS and flutter valve to assist with old blood clearance. - No more coumadin, finished 7 months for what is essentially a provoked PE. - Chest CT findings are consistent with dependent infiltrate likely from breathing in blood, however given history would agree with HCAP coverage and would finish 8 day course, tailor down pending finalization of culture. - May transfer to monitored bed at this point. - PCCM will sign off, please call back if needed, if red blood is noticed in expectorant then will need to know.  Rush Farmer, M.D. [facility] Pulmonary/Critical Care Medicine. Pager: [phone]. After hours pager: [phone].

## 2014-03-26 NOTE — Consult Note (Signed)
[facility]  Telephone:([phone]) [place] NOTE  [name]                                MR#: [id number]  DOB: [birth year]                                 CSN#: [id number]  Referring MD:  IM Teaching Service Primary Care Provider: Jenny Reichmann, MD  Reason for Consult: Rule Out Multiple Myeloma   HFW:YOVZCH [name] is a 68 y.o. woman with multiple medical issues listed below asked to see in consultation for evaluation of possible Multiple Myeloma.  The patient has a history of PE following arthroscopic surgery,as well as LLE DVT  requiring Coumadin therapy since October of 2014. On 4/7 she presented to the ED with a 2 day history of hemoptysis and mucous material. INR was elevated at 6 requiring FFP and Vit K with improvement of coagulopathy (INR 1.4 this morning). Anticoagulation is on hold until hemoptysis resolves.CT angiogram on 4/7 was negative for  pulmonary embolus. Bilateral pneumonia was suspected, requiring IV antibiotics.   Interestingly, diffusely heterogeneous appearance to the visualized bones were seen, more notable than on a prior CT in 2010. Bone scan on 4/8 showed old fractures in posterior 8-9 th ribs, and in a lumbar XR a new lumbar spinal fracture was noted, not picked up by the bone scan. Given the mottled appearance of the CT films,workup for multiple myeloma was initiated:  She had anemia in the setting of chronic disease, iron deficiency  and blood loss, and mild renal insufficiency was seen with a Cr 1.62. Sodium was normal. Her Calcium however was 12.3 on admission, today at greater than 15 receiving Calcitonin 100U nasal spray, Zometa 4 mg  and hydration IV. SPEP / UPEP / IFE are pending. No smear is available to check for Rouleaux. B2 microglobulin pending.  Patient denies any history or family history of bone marrow disorders. Never had a bone marrow biopsy in the past. Abnormal bone pain has been present on  and off for more than a year. She was not aware of the new lumbar spine fracture described above. Patient denies history of  atypical infections such as shingles of meningitis.Denies chills, night sweats, anorexia or abnormal weight loss. We were requested to see this patient with recommendations.    PMH:  Past Medical History  Diagnosis Date  . Neurofibromatosis   . Diabetes mellitus   . HTN (hypertension)   . Obesity   . Osteoarthritis, knee   . Hyperlipidemia     statin intolerant. LDL is 83  . UTI (lower urinary tract infection) 05/29/12  . 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 Pulmonary embolism 09/17/2013   Left saphenous vein DVT 11/2013   Known Colon Polyps per colonoscopy report 05/2011 (Dr. Watt Climes)  Surgeries:  Past Surgical History  Procedure Laterality Date  . Cardiac catheterization    . Tonsillectomy and adenoidectomy    . Clavicle surgery    . Abdominal hysterectomy    . Ankle fracture surgery Right   . Total knee arthroplasty Left 09/15/2013    Procedure: TOTAL KNEE ARTHROPLASTY;  Surgeon: Vickey Huger, MD;  Location: [facility];  Service: Orthopedics;  Laterality: Left;    Allergies:  Allergies  Allergen Reactions  . Codeine Nausea And Vomiting  . Hydrocodone Nausea And Vomiting  . Niaspan [Niacin Er] Nausea Only  . Robaxin [Methocarbamol] Other (See Comments)    Made patient feel funny  . Statins Nausea And Vomiting and Other (See Comments)    Myalgias/leg pain with atorvastatin, rosuvastatin, lovastatin, simvastatin  . Tetracycline Other (See Comments)    Pt doesn't remember reaction  . Zetia [Ezetimibe] Nausea And Vomiting  . Zithromax [Azithromycin] Nausea And Vomiting    Medications:   Prior to Admission:  . calcitonin  100 Units Intramuscular Daily  . [START ON 03/27/2014] ferrous sulfate  325 mg Oral Q breakfast  . insulin aspart  0-15 Units Subcutaneous TID WC  . insulin aspart  0-5  Units Subcutaneous QHS  . levofloxacin  750 mg Oral Q48H  . polyethylene glycol  17 g Oral BID  . senna-docusate  2 tablet Oral BID  . sodium chloride  3 mL Intravenous Q12H  . zoledronic acid (ZOMETA) IV  4 mg Intravenous Once    LKG:[id number] (DILAUDID) injection  ROS: Constitutional: Negative for weight loss. Negative for fever, chills . She has chronic night sweats,frequent.  Eyes: Negative for blurred vision and double vision.  Respiratory:Positive for productive cough. Hemoptysis as in HPI, now improving but not resolved.No hoarseness.No neck swelling. Positive for shortness of breath on exertion. No pleuritic chest pain.  Cardiovascular: Negative for chest pain. No palpitations.  GI: Negative for  nausea, vomiting, diarrhea or constipation. No change in bowel caliber. No  Melena or hematochezia. Negative for abdominal pain.  GU: Negative for hematuria. No loss of urinary control. No urinary retention.No changes in urine color.  Skin: Negative for itching. No rash. No petechia. No easy  Bruising. Musculoskeletal: Denies bone pain.  Neurological: No headaches.No confusion. No motor or sensory deficits.No headaches. Psych: No depression. No suicidal thoughts.  Family History:    Family History  Problem Relation Age of Onset  . Heart disease Mother   . Arthritis Mother   . Stroke Mother    . Heart disease Son   Heavy history of strokes in the family. 1 aunt died with cancer of unknown type. No history of blood dyscrasias, No other history of cancer in the family.    Health Maintenance: the patient is up to date with physicals , vaccinations, colonoscopies and and mammograms  Social History: Married. Lives in [place] with husband of 1 years. 2 sons in good health. No alcohol, tobacco or recreational drug use. 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  Physical Exam     Filed Vitals:   03/26/14 1100  BP:   Pulse: 144  Temp:   Resp:      Filed  Weights   03/24/14 1407  Weight: 190 lb (86.183 kg)    General:   68 year old white female  in no acute distress, somewhat lethargic, well-developed, well-nourished.  HEENT: Normocephalic, atraumatic,some hirsutism noted. Sclera anicteric.No periorbital edema. Oral cavity without thrush or lesions.No glossitis. NECK:supple. No thyromegaly, no cervical or supraclavicular adenopathy  LUNGS: clear bilaterally . No wheezing, rhonchi or rales. No axillary masses. BREASTS: not examined. CARDIOVASCULAR: regular rate and rhythm,no murmur , rubs or gallops ABDOMEN: soft , moderately obese, nontender, bowel sounds x4. No HSM. No organomegaly.  GU/rectal: deferred. EXTREMITIES: no clubbing cyanosis or edema. No bruising or petechial rash. Neurofibromatoses present in all areas of the skin. Well healed left knee scar MUSCULOSKELETAL: no  spinal tenderness. Lumbar pain reproducible.  NEURO: Non Focal. No Horner's.   Labs:   SPEP/UPEP:  No results found for this basename: SPEP,  UPEP    _0 (IGA,IGG,IGM)@  CBC   Recent Labs Lab 03/24/14 1905 03/25/14 0850 03/25/14 1100 03/25/14 2001 03/26/14 0309  WBC 10.6* 8.5 9.4 9.4 8.6  HGB 7.4* 7.0* 7.4* 8.5* 8.3*  HCT 23.0* 21.9* 23.2* 26.0* 25.4*  PLT 327 268 285 275 284  MCV 79.0 80.8 81.1 81.3 81.4  MCH 25.4* 25.8* 25.9* 26.6 26.6  MCHC 32.2 32.0 31.9 32.7 32.7  RDW 19.8* 19.3* 19.3* 18.9* 19.1*  LYMPHSABS 2.0  --   --   --   --   MONOABS 1.1*  --   --   --   --   EOSABS 0.1  --   --   --   --   BASOSABS 0.1  --   --   --   --      CMP    Recent Labs Lab 03/24/14 1905 03/25/14 0850 03/26/14 0309  NA 134* 138 138  K 4.8 4.2 4.1  CL 92* 91* 90*  CO2 _1 GLUCOSE 111* 92 112*  BUN 28* 24* 28*  CREATININE 1.14* 1.24* 1.62*  CALCIUM 12.3* 12.4* >15.0*  MG  --   --  1.9  AST 36  --   --   ALT 9  --   --   ALKPHOS 70  --   --   BILITOT 0.4  --   --      Anemia panel:   Recent Labs  03/25/14 1100    VITAMINB12 263  FOLATE >20.0  FERRITIN 14  TIBC 302  IRON 80  RETICCTPCT 3.4*     Imaging Studies:  Dg Chest 2 View  03/24/2014 COMPARISON:  09/19/2013  FINDINGS: Cardiomediastinal silhouette is stable. There is streaky bilateral basilar airspace disease highly suspicious for infiltrates. Best seen on lateral view. No pulmonary edema. Osteopenia and degenerative changes thoracic spine. There is moderate compression deformity upper lumbar spine of indeterminate age. Clinical correlation is necessary  IMPRESSION: Streaky bilateral basilar airspace disease suspicious for infiltrates. Follow-up to resolution recommended. No pulmonary edema. Moderate compression fracture upper lumbar spine of indeterminate age. Clinical correlation is necessary.   Electronically Signed   By: Lahoma Crocker M.D.   On: 03/24/2014 18:52   Dg Lumbar Spine 2-3 Views  03/24/2014   FINDINGS: There are 5 lumbar type vertebral bodies. There is a degenerative grade 1 anterolisthesis at L4-5 which appears stable. As demonstrated on today's chest radiographs, there is a superior endplate compression deformity at L1 which is new compared with the lumbar spine radiographs obtained 6 weeks ago. This results in approximately 15% loss of vertebral body height. The posterior vertebral body cortex appears grossly intact. There is stable mild disc space loss at L2-3. There is facet disease inferiorly.  IMPRESSION: Superior endplate compression deformity at L1 is new compared with radiographs obtained 6 weeks ago consistent with an acute/subacute injury. Stable anterolisthesis at L4-5.   Electronically Signed   By: Camie Patience M.D.   On: 03/24/2014 20:01   Ct Angio Chest W/cm &/or Wo Cm   03/24/2014    COMPARISON:  Chest radiograph performed earlier today at 6:46 p.m., and CTA of the chest performed 09/17/2013  FINDINGS: There is no evidence of pulmonary embolus. Evaluation for pulmonary embolus is mildly suboptimal due to limitations in the  timing of the contrast bolus.  Patchy bilateral airspace consolidation is noted within all lobes of both lungs, though most prominent at the lower lung lobes bilaterally. Findings are compatible with diffuse multifocal pneumonia. There is no evidence of pleural effusion or pneumothorax. No masses are identified, though evaluation is suboptimal due to airspace consolidation; no abnormal focal contrast enhancement is seen.  Diffuse coronary artery calcifications are seen. Scattered mediastinal nodes remain normal in size, though visualized retroesophageal nodes are slightly unusual. No pericardial effusion is identified. The great vessels are grossly unremarkable in appearance. No axillary lymphadenopathy is seen. The visualized portions of the thyroid gland are unremarkable in appearance.  The visualized portions of the liver and spleen are unremarkable. The visualized portions of the pancreas, gallbladder, stomach and adrenal glands are within normal limits.  There is a diffusely heterogeneous appearance to the attenuation of visualized osseous structures, somewhat more prominent than on the prior study and new from 2010; would correlate for evidence of underlying systemic condition or possibly metastatic disease. There is a partially healed chronic right fifth posterior rib fracture.  Review of the MIP images confirms the above findings.  IMPRESSION: 1. No evidence of pulmonary embolus. 2. Relatively diffuse multifocal pneumonia noted bilaterally. 3. Diffuse coronary artery calcifications seen. 4. Diffusely heterogeneous appearance to the attenuation of visualized osseous structures, somewhat more prominent than on the prior study and new from 2010. Would correlate for evidence of underlying systemic condition, or possibly metastatic disease. Bone scan or correlation with lab findings could be considered for further evaluation, as deemed clinically appropriate.   Electronically Signed   By: Garald Balding M.D.    On: 03/24/2014 21:35   Nm Bone Scan Whole Body  03/25/2014   COMPARISON:  Chest CT 03/24/2014.  FINDINGS: Punctate area of increased activity noted over approximate the right posterior fifth and a left posterior ninth ribs. These correspond to old rib fractures. Given the mottled appearance of the bony structures of the chest on recent chest CT, myeloma should be considered and serum protein electrophoresis should be considered.  IMPRESSION: 1. Findings consistent with old right posterior fifth and left posterior ninth rib fractures. No evidence of metastatic disease. 2. Given the mottled appearance of the bony structures on recent chest CT, to evaluate for myeloma serum protein electrophoresis should be considered.   Electronically Signed   By: Marcello Moores  Register   On: 03/25/2014 16:11      Assessment and Plan:  1. 68 y.o. female found to have suspicious bone abnormalities, asked to see in order to rule out Multiple Myeloma. SPEP and UPEP with IFE pending. Check Beta-2 microglobulin. Save smear to check for rouleaux. Other testing that may be needed if the above are abnormal includes Sed rate,quantitative immunoglobulins, LDH,serum light chains, metastatic bone survey, uric acid, 24 hr urine, Epo levels and possible Bone marrow biopsy to be performed for definite tissue diagnosis    2. Hypercalcemia, likely of malignancy. Agree with Calcitonin 100U nasal spray, Zometa 4 mg and hydration IV. Monitor closely with serial labs.Review and discontinue any  supplemental calcium (TPN, Vit D, Thiazides).  3. Anemia, multifactorial, of acute on chronic, malignancy and blood loss. On Iron supplementation. May need to check Epo levels. No transfusion needed at this time. She is up to date with colonoscopy (2012)  4. Lumbar spine fracture without neuro abnormalities at this time: monitor closely in the setting of abnormal osseous findings suspicious for myeloma. Pain controlled with current narcotic regimen.  5.  History of PE and DVT on  Coumadin therapy since October of 2014. INR was elevated at 6, requiring FFP and Vit K with improvement of coagulopathy (INR 1.4 this morning). Anticoagulation is on hold until hemoptysis resolves.  6. Hemoptysis improved with reversal of anticoagulation. Coumadin on hold until resolution of bleeding.   7. Suspected bilateral pneumonia, Cultures pending.On IV antibiotics  Appreciate the care provided to this patient  by the admitting team. Dr. Jana Hakim  is to see Ms.Bartko with recommendations regarding diagnosis, treatment options and further workup studies. Addendum to this note to be written. Thank you for the referral.  Rondel Jumbo, PA-C 03/26/2014 11:39 AM   ADDENDUM: 68 year old [place] woman with multiple bone fractures and apparent lytic lesions, with hypercalcemia, anemia (at least partially due to iron deficiency), renal insufficiency and an elevated total protein; with electrophoretic and IFE studies pending.  I spent approximately 45 minutes discussing w. Ms Jinkins her likely diagnosis of multiple myeloma. She understands this is not a curable cancer but a very treatable one and that the quality of life of patients can be very good for many years. We do have to wait for the confirmatory but with the expectation that they will confirm the diagnosis I suggest you schedule a bone marrow biopsy for Monday 4/13. This can be done by IR and should be sent for cytogenetics and FISH studies as well as for routine aspirate and biopsy.  Once we have the biopsy results I will meet with the patient and her family to discuss specific treatment plans. That can be done on an outpatient basis--as can all myeloma treatments--so if patient is discharged before biopsy results are available I will make sure she has an outpatient followup with me.  Suggest feraheme for more rapid iron replacement. Once iron deficiency has been addressed can consider a trial of EPO given the  component of anemia of renal failur ein this case.  I personally saw this patient and performed a substantive portion of this encounter with the listed APP documented above. Will follow with you.  Chauncey Cruel, MD

## 2014-03-26 NOTE — Evaluation (Signed)
Occupational Therapy Evaluation Patient Details Name: [name] MRN: [id number] DOB: 02-21-1946 Today's Date: 03/26/2014    History of Present Illness KRISLYN DONNAN is a 68 y.o. female presenting with hemoptysis and back pain. PMH is significant for HTN, DM, STEMI, PE on coumadin. INR >6 on adm.  Possible malignancy noted on bone scan.  Oncologist to address.   Clinical Impression   Pt admitted with above diagnosis and has the deficit listed below.  Pt would benefit from cont OT to attempt to increase I with basic adls and decrease burden of care on caregiver at home.      Follow Up Recommendations  CIR;Supervision/Assistance - 24 hour    Equipment Recommendations  Tub/shower seat    Recommendations for Other Services Rehab consult     Precautions / Restrictions Precautions Precautions: Fall Required Braces or Orthoses: Other Brace/Splint Other Brace/Splint: Pt has a back corset at home she may want to try just for pain relief.  While walking pt commented it felt better when gait belt was tighter.  Discuessed corset and left note for MD to see if ok for pt to bring in and use. Restrictions Weight Bearing Restrictions: No      Mobility Bed Mobility Overal bed mobility: Needs Assistance Bed Mobility: Rolling;Sidelying to Sit;Sit to Sidelying Rolling: Supervision Sidelying to sit: Mod assist;HOB elevated       General bed mobility comments: vc for technique to minimize back pain; pain limiting effort and required assist  Transfers Overall transfer level: Needs assistance Equipment used: Rolling walker (2 wheeled) Transfers: Sit to/from [employer] Sit to Stand: Min assist Stand pivot transfers: Min assist;+2 safety/equipment       General transfer comment: Cues for hand placement needed on all transfers.    Balance Overall balance assessment: Needs assistance Sitting-balance support: Bilateral upper extremity supported;Feet  supported Sitting balance-Leahy Scale: Fair Sitting balance - Comments: EOB about 4 minutes   Standing balance support: Bilateral upper extremity supported;During functional activity Standing balance-Leahy Scale: Poor Standing balance comment: pt with so much pain she really needs walker for stability                             ADL Overall ADL's : Needs assistance/impaired Eating/Feeding: Independent   Grooming: Set up;Sitting   Upper Body Bathing: Set up;Sitting   Lower Body Bathing: Moderate assistance;Sit to/from stand Lower Body Bathing Details (indicate cue type and reason): Pt with difficulty reaching lower legs due to pain.  Crossing legs up painful on back as well. Upper Body Dressing : Minimal assistance;Sitting Upper Body Dressing Details (indicate cue type and reason): min assist due to pain. Lower Body Dressing: Moderate assistance;Sit to/from stand Lower Body Dressing Details (indicate cue type and reason): mod assist for socks and shoes and starting pants.  Pt would benefit from being introduced to AE Toilet Transfer: Minimal assistance;+2 for safety/equipment;Ambulation;Comfort height toilet Toilet Transfer Details (indicate cue type and reason): Pt walked to toilet.  Pt cont to need cues to push up from surface she is leaving and reach back for surface she is going to. Toileting- Water quality scientist and Hygiene: Min guard;Sit to/from stand       Functional mobility during ADLs: +2 for safety/equipment;Minimal assistance General ADL Comments: Pt with great amount of assist needed for all LE adls due to pain.     Vision  Perception     Praxis      Pertinent Vitals/Pain Pt started session with pain at 4/10 in back and by end it was a 7/10.  Pt was premedicated prior to therapy.  HR up to 144 with activity.     Hand Dominance Right   Extremity/Trunk Assessment Upper Extremity Assessment Upper Extremity Assessment:  Overall WFL for tasks assessed   Lower Extremity Assessment Lower Extremity Assessment: Defer to PT evaluation   Cervical / Trunk Assessment Cervical / Trunk Assessment: Normal   Communication Communication Communication: No difficulties   Cognition Arousal/Alertness: Awake/alert Behavior During Therapy: WFL for tasks assessed/performed Overall Cognitive Status: Within Functional Limits for tasks assessed                     General Comments       Exercises       Shoulder Instructions      Home Living Family/patient expects to be discharged to:: Private residence Living Arrangements: Spouse/significant other Available Help at Discharge: Family;Available 24 hours/day Type of Home: House Home Access: Ramped entrance;Stairs to enter Entrance Stairs-Number of Steps: 2 Entrance Stairs-Rails: Right Home Layout: One level     Bathroom Shower/Tub: Walk-in shower;Door   [employer] Toilet: Standard Bathroom Accessibility: Yes How Accessible: Accessible via walker Home Equipment: Walker - 2 wheels;Bedside commode;Cane - single point;Wheelchair - manual;Hospital bed   Additional Comments: Pt has been sponge bathing for last 3 weeks due to pain.      Prior Functioning/Environment Level of Independence: Needs assistance  Gait / Transfers Assistance Needed: using RW could walk household distances; using RW x 2 weeks due to back pain; at times husband pushed in w/c due to pain (mostly in past week) ADL's / Homemaking Assistance Needed: Pt was I with most adls 3 weeks ago but now sponges bathes only and husband assists wtih LE bathing and dressing.        OT Diagnosis: Acute pain;Generalized weakness   OT Problem List: Decreased activity tolerance;Impaired balance (sitting and/or standing);Decreased knowledge of use of DME or AE;Pain   OT Treatment/Interventions: Self-care/ADL training;Therapeutic activities;DME and/or AE instruction;Balance training    OT  Goals(Current goals can be found in the care plan section) Acute Rehab OT Goals Patient Stated Goal: be able to manage her pain and walk again OT Goal Formulation: With patient Time For Goal Achievement: 04/09/14 Potential to Achieve Goals: Good ADL Goals Pt Will Perform Grooming: with supervision;standing Pt Will Perform Lower Body Bathing: with supervision;sit to/from stand;with adaptive equipment Pt Will Perform Lower Body Dressing: with supervision;with adaptive equipment;sit to/from stand Additional ADL Goal #1: Pt will complete all toileting with S.  OT Frequency: Min 3X/week   Barriers to D/C:            Co-evaluation PT/OT/SLP Co-Evaluation/Treatment: Yes Reason for Co-Treatment: Complexity of the patient's impairments (multi-system involvement);For patient/therapist safety PT goals addressed during session: Mobility/safety with mobility;Balance;Proper use of DME OT goals addressed during session: ADL's and self-care      End of Session Equipment Utilized During Treatment: Gait belt;Rolling walker;Oxygen Nurse Communication: Mobility status;Patient requests pain meds;Other (comment) (pain meds pre treatment)  Activity Tolerance: Patient tolerated treatment well;Patient limited by pain Patient left: in chair;with call bell/phone within reach   Time: 9629-5284 OT Time Calculation (min): 30 min Charges:  OT General Charges $OT Visit: 1 Procedure OT Evaluation $Initial OT Evaluation Tier I: 1 Procedure OT Treatments $Self Care/Home Management : 8-22 mins G-Codes:    Vickki Muff  03/26/2014, 11:26 AM [phone]

## 2014-03-27 LAB — BASIC METABOLIC PANEL
BUN: 28 mg/dL — ABNORMAL HIGH (ref 6–23)
BUN: 24 mg/dL — AB (ref 6–23)
CO2: 25 meq/L (ref 19–32)
CO2: 24 mEq/L (ref 19–32)
Calcium: 10.9 mg/dL — ABNORMAL HIGH (ref 8.4–10.5)
Calcium: 10.2 mg/dL (ref 8.4–10.5)
Chloride: 93 mEq/L — ABNORMAL LOW (ref 96–112)
Chloride: 96 mEq/L (ref 96–112)
Creatinine, Ser: 1.45 mg/dL — ABNORMAL HIGH (ref 0.50–1.10)
Creatinine, Ser: 1.3 mg/dL — ABNORMAL HIGH (ref 0.50–1.10)
GFR calc Af Amer: 42 mL/min — ABNORMAL LOW (ref >90)
GFR calc non Af Amer: 37 mL/min — ABNORMAL LOW (ref >90)
GFR calc non Af Amer: 42 mL/min — ABNORMAL LOW (ref >90)
GFR, EST AFRICAN AMERICAN: 48 mL/min — AB (ref >90)
Glucose, Bld: 176 mg/dL — ABNORMAL HIGH (ref 70–99)
Glucose, Bld: 175 mg/dL — ABNORMAL HIGH (ref 70–99)
POTASSIUM: 4.2 meq/L (ref 3.7–5.3)
Potassium: 4.2 mEq/L (ref 3.7–5.3)
SODIUM: 137 meq/L (ref 137–147)
Sodium: 137 mEq/L (ref 137–147)

## 2014-03-27 LAB — CBC
HCT: 24.8 % — ABNORMAL LOW (ref 36.0–46.0)
HEMOGLOBIN: 8 g/dL — AB (ref 12.0–15.0)
MCH: 26.4 pg (ref 26.0–34.0)
MCHC: 32.3 g/dL (ref 30.0–36.0)
MCV: 81.8 fL (ref 78.0–100.0)
Platelets: 250 10*3/uL (ref 150–400)
RBC: 3.03 MIL/uL — AB (ref 3.87–5.11)
RDW: 19.2 % — ABNORMAL HIGH (ref 11.5–15.5)
WBC: 8.5 10*3/uL (ref 4.0–10.5)

## 2014-03-27 LAB — PROTEIN ELECTROPHORESIS, SERUM
ALBUMIN ELP: 27.9 % — AB (ref 55.8–66.1)
ALPHA-2-GLOBULIN: 5.8 % — AB (ref 7.1–11.8)
Alpha-1-Globulin: 3.8 % (ref 2.9–4.9)
BETA GLOBULIN: 1.7 % — AB (ref 4.7–7.2)
Beta 2: 4.4 % (ref 3.2–6.5)
Gamma Globulin: 56.4 % — ABNORMAL HIGH (ref 11.1–18.8)
M-SPIKE, %: 3.82 g/dL
TOTAL PROTEIN ELP: 9.9 g/dL — AB (ref 6.0–8.3)

## 2014-03-27 LAB — UIFE/LIGHT CHAINS/TP QN, 24-HR UR
ALPHA 1 UR: DETECTED — AB
Albumin, U: DETECTED
Alpha 2, Urine: DETECTED — AB
Beta, Urine: DETECTED — AB
FREE KAPPA/LAMBDA RATIO: 72.86 ratio — AB (ref 2.04–10.37)
FREE LAMBDA LT CHAINS, UR: 0.14 mg/dL (ref 0.02–0.67)
Free Kappa Lt Chains,Ur: 10.2 mg/dL — ABNORMAL HIGH (ref 0.14–2.42)
GAMMA UR: DETECTED — AB
TOTAL PROTEIN, URINE-UPE24: 13.3 mg/dL

## 2014-03-27 LAB — GLUCOSE, CAPILLARY
GLUCOSE-CAPILLARY: 196 mg/dL — AB (ref 70–99)
GLUCOSE-CAPILLARY: 126 mg/dL — AB (ref 70–99)
GLUCOSE-CAPILLARY: 110 mg/dL — AB (ref 70–99)
GLUCOSE-CAPILLARY: 103 mg/dL — AB (ref 70–99)
Glucose-Capillary: 170 mg/dL — ABNORMAL HIGH (ref 70–99)

## 2014-03-27 LAB — PROTIME-INR
INR: 1.47 (ref 0.00–1.49)
Prothrombin Time: 17.4 seconds — ABNORMAL HIGH (ref 11.6–15.2)

## 2014-03-27 MED ORDER — METOPROLOL TARTRATE 1 MG/ML IV SOLN
5.0000 mg | Freq: Once | INTRAVENOUS | Status: AC
  Administered 2014-03-27: 5 mg via INTRAVENOUS
  Filled 2014-03-27: qty 5

## 2014-03-27 MED ORDER — BENZONATATE 100 MG PO CAPS
100.0000 mg | ORAL_CAPSULE | Freq: Three times a day (TID) | ORAL | Status: DC | PRN
  Filled 2014-03-27: qty 1

## 2014-03-27 MED ORDER — ONDANSETRON HCL 4 MG PO TABS
8.0000 mg | ORAL_TABLET | Freq: Three times a day (TID) | ORAL | Status: DC | PRN
  Administered 2014-03-28 – 2014-03-30 (×4): 8 mg via ORAL
  Filled 2014-03-27 (×4): qty 2

## 2014-03-27 MED ORDER — OXYCODONE HCL 5 MG PO TABS: 5.0000 mg | ORAL_TABLET | Freq: Four times a day (QID) | ORAL | Status: DC | PRN

## 2014-03-27 MED ORDER — SODIUM CHLORIDE 0.9 % IV SOLN
510.0000 mg | Freq: Once | INTRAVENOUS | Status: AC
  Administered 2014-03-27: 510 mg via INTRAVENOUS
  Filled 2014-03-27: qty 17

## 2014-03-27 MED ORDER — OXYCODONE-ACETAMINOPHEN 5-325 MG PO TABS: 1.0000 | ORAL_TABLET | Freq: Four times a day (QID) | ORAL | Status: DC | PRN

## 2014-03-27 MED ORDER — FUROSEMIDE 20 MG PO TABS
20.0000 mg | ORAL_TABLET | Freq: Once | ORAL | Status: AC
  Administered 2014-03-27: 20 mg via ORAL
  Filled 2014-03-27: qty 1

## 2014-03-27 MED ORDER — OXYCODONE HCL 5 MG PO TABS
5.0000 mg | ORAL_TABLET | ORAL | Status: DC | PRN
  Administered 2014-03-27 – 2014-03-28 (×4): 5 mg via ORAL
  Filled 2014-03-27 (×4): qty 1

## 2014-03-27 MED ORDER — METOPROLOL TARTRATE 25 MG PO TABS
25.0000 mg | ORAL_TABLET | Freq: Two times a day (BID) | ORAL | Status: DC
  Administered 2014-03-27 – 2014-03-31 (×9): 25 mg via ORAL
  Filled 2014-03-27 (×11): qty 1

## 2014-03-27 MED ORDER — DM-GUAIFENESIN ER 30-600 MG PO TB12
1.0000 | ORAL_TABLET | Freq: Two times a day (BID) | ORAL | Status: DC
  Administered 2014-03-27 – 2014-03-31 (×8): 1 via ORAL
  Filled 2014-03-27 (×11): qty 1

## 2014-03-27 NOTE — Progress Notes (Signed)
Notified Md about Hr 130s, Temp 100.2 Bp 134/63. No c/o pain .  MD aware of am labs.  No new orders at this time.  Will continue to monitor.  Wynona Canes

## 2014-03-27 NOTE — Progress Notes (Signed)
I have seen and examined this patient. I have discussed with Dr Skeet Simmer.  I agree with their findings and plans as documented in their progress note.  Planning Bone Marrow Biopsy for Monday, 4/13. Working actively on figuring out patient's opiate need to provide analgesia level keeping patient below 5 out of 10 on pain scale most of the time.  Good response of hypercalcemia to Calcitonin, Zoledronic Acid, and NS calciuresis.   Will stop NS calciuresis Can switch IM Calcitonin back to calcitonin Nasal spray.

## 2014-03-27 NOTE — Clinical Documentation Improvement (Signed)
  Clinical Indicators:  O2 Sats in mid-80's on admission requiring 02 at 2 liters nasal cannula per ED physician documentation.  O2 requirements have now increased progressively up to 6 liters nasal cannula  Progress note today 03/27/14 reports "increased WOB requiring [place] (subcostal retractions)"   Possible Conditions:   - Acute Respiratory Failure with increasing 02 demands since admission   - Other Condition   - Unable to Clinically Determine     Thank You, Erling Conte ,RN Clinical Documentation Specialist:  [facility]

## 2014-03-27 NOTE — Progress Notes (Signed)
Notified Md pt's bp 175/91 HR 124.  Asymptomatic and  No c/o pain. New orders received. Will continue to monitor. Wynona Canes

## 2014-03-27 NOTE — Progress Notes (Signed)
Name: [name] MRN: [id number] DOB: 1946-09-25    ADMISSION DATE:  03/24/2014 CONSULTATION DATE:  03/24/2014  REFERRING MD :  FPTS PRIMARY SERVICE:  FPTS  CHIEF COMPLAINT:  Hemoptysis  BRIEF PATIENT DESCRIPTION: 68 year old female with PMH of PE post left knee replacement who has been on coumadin with no recent change in her dosing, who noticed hemoptysis two days PTP.  On the night of the seventh noticed that hemoptysis was getting much worse and family forced her to come to the ED.  In the ED was noted to have moderate amount of blood mixed in with her sputum however was also noted to have an INR of 6.  Reports that hemoptysis is worsening but the consistency has been that of phlegm.  She continued to take her coumadin inspite of hemoptysis.  SIGNIFICANT EVENTS / STUDIES:  4/7>>>Hemoptysis.  LINES / TUBES: PIV  CULTURES: Blood 4/7>>> Urine 4/7>>> Sputum 4/7>>>  ANTIBIOTICS: Vancomycin 4/7>>> Zosyn 4/7>>>  SUBJECTIVE: Hemoptysis continued but patient reports it is brown and black material mixed with sputum and more thick, no bright red blood.  VITAL SIGNS: Temp:  [97.8 F (36.6 C)-100.2 F (37.9 C)] 99.7 F (37.6 C) (04/10 0800) Pulse Rate:  [65-137] 124 (04/10 0800) Resp:  [16-32] 32 (04/10 0600) BP: (134-175)/(63-101) 150/69 mmHg (04/10 0800) SpO2:  [91 %-99 %] 93 % (04/10 0800)  PHYSICAL EXAMINATION: General:  Chronically ill appearing female, NAD. Neuro:  Alert and interactive, following all commands. HEENT:  [place]/AT, PERRL, EOM-I and MMM, blood on tongue. Neck:  Supple, -LAN and -thyromegally. Cardiovascular:  RRR, Nl S1/S2, -M/R/G. Lungs:  Bibasilar crackles, worsened overnight. Abdomen:  Soft, NT, ND and +BS. Musculoskeletal:  -edema and -tenderness. Skin:  Intact.  Recent Labs Lab 03/26/14 0309 03/26/14 1535 03/27/14 0350  NA 138 137 137  K 4.1 4.7 4.2  CL 90* 92* 93*  CO2 28 30 25   BUN 28* 30* 28*  CREATININE 1.62* 1.65* 1.45*  GLUCOSE  112* 182* 176*    Recent Labs Lab 03/25/14 2001 03/26/14 0309 03/27/14 0350  HGB 8.5* 8.3* 8.0*  HCT 26.0* 25.4* 24.8*  WBC 9.4 8.6 8.5  PLT 275 284 250   Nm Bone Scan Whole Body  03/25/2014   CLINICAL DATA:  Back pain.  EXAM: NUCLEAR MEDICINE WHOLE BODY BONE SCAN  TECHNIQUE: Whole body anterior and posterior images were obtained approximately 3 hours after intravenous injection of radiopharmaceutical.  RADIOPHARMACEUTICALS:  25 mCi Technetium-99 MDP  COMPARISON:  Chest CT 03/24/2014.  FINDINGS: Punctate area of increased activity noted over approximate the right posterior fifth and a left posterior ninth ribs. These correspond to old rib fractures. Given the mottled appearance of the bony structures of the chest on recent chest CT, myeloma should be considered and serum protein electrophoresis should be considered.  IMPRESSION: 1. Findings consistent with old right posterior fifth and left posterior ninth rib fractures. No evidence of metastatic disease. 2. Given the mottled appearance of the bony structures on recent chest CT, to evaluate for myeloma serum protein electrophoresis should be considered.   Electronically Signed   By: Marcello Moores  Register   On: 03/25/2014 16:11    ASSESSMENT / PLAN:  68 year old female with PMH of a PE after knee replacement surgery who developed hemoptysis, continued to take her coumadin, presenting coagulopathic and with hemoptysis.  Old blood at this point.  Plan: - Still no indication for bronchoscopy thus far. - If hemoptysis transforms to frank blood then  may need interventions. - No further anti-coagulation. - IS and flutter valve to assist with old blood clearance. - No more coumadin, finished 7 months for what is essentially a provoked PE. - Chest CT findings are consistent with dependent infiltrate likely from breathing in blood, however given history would agree with HCAP coverage and would finish 8 day course, tailor down pending finalization of  culture. - May transfer to monitored bed at this point. - PCCM will sign off, please call back if needed, if red blood is noticed in expectorant then will need to know.  Rush Farmer, M.D. [facility] Pulmonary/Critical Care Medicine. Pager: [phone]. After hours pager: [phone].

## 2014-03-27 NOTE — H&P (Signed)
[name] is an 68 y.o. female.   Chief Complaint: Pt admitted with hemoptysis Has been on coumadin since 09/2013 for PE after Knee surgery. Developed cough then hemoptysis which worsened over few days. Hospitalized and INR 6.0 Reversed with FFP and Vit K Anticoagulation held now During work up for cough and back pain; Lumbar 1 fx and B ribs fxs were discovered Mottled appearance of bones on these films prompted Oncology consult Pt is also hypercalcemic; chronic bony pain Request for bone marrow biopsy to evaluate for Multiple Myeloma Will schedule for 4/13 (Mon)   HPI: DM; HTN; HLD; CAD/MI; Hx PE; neurofibromatosis  Past Medical History  Diagnosis Date  . Neurofibromatosis   . Diabetes mellitus   . HTN (hypertension)   . Obesity   . Osteoarthritis, knee   . Hyperlipidemia     statin intolerant. LDL is 83  . UTI (lower urinary tract infection) 05/29/12  . 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 Pulmonary embolism [id number]    Past Surgical History  Procedure Laterality Date  . Cardiac catheterization    . Tonsillectomy and adenoidectomy    . Clavicle surgery    . Abdominal hysterectomy    . Ankle fracture surgery Right   . Total knee arthroplasty Left 09/15/2013    Procedure: TOTAL KNEE ARTHROPLASTY;  Surgeon: Vickey Huger, MD;  Location: [facility];  Service: Orthopedics;  Laterality: Left;    Family History  Problem Relation Age of Onset  . Heart disease Mother   . Arthritis Mother   . Stroke Mother   . Heart disease Son    Social History:  reports that she has never smoked. She has never used smokeless tobacco. She reports that she does not drink alcohol or use illicit drugs.  Allergies:  Allergies  Allergen Reactions  . Codeine Nausea And Vomiting  . Hydrocodone Nausea And Vomiting  . Niaspan [Niacin Er] Nausea Only  . Robaxin [Methocarbamol] Other (See Comments)    Made patient feel funny  .  Statins Nausea And Vomiting and Other (See Comments)    Myalgias/leg pain with atorvastatin, rosuvastatin, lovastatin, simvastatin  . Tetracycline Other (See Comments)    Pt doesn't remember reaction  . Zetia [Ezetimibe] Nausea And Vomiting  . Zithromax [Azithromycin] Nausea And Vomiting    Medications Prior to Admission  Medication Sig Dispense Refill  . aspirin EC 81 MG tablet Take 81 mg by mouth at bedtime.      Marland Kitchen glimepiride (AMARYL) 2 MG tablet Take 4 mg by mouth 2 (two) times daily.       Marland Kitchen losartan (COZAAR) 25 MG tablet Take 25 mg by mouth daily.      . Menthol-Methyl Salicylate (MUSCLE RUB EX) Apply 1 application topically 3 (three) times daily as needed (back pain).      . metFORMIN (GLUCOPHAGE) 1000 MG tablet Take 1 tablet (1,000 mg total) by mouth 2 (two) times daily.  180 tablet  3  . methocarbamol (ROBAXIN) 500 MG tablet Take 500-1,000 mg by mouth every 4 (four) hours as needed for muscle spasms.       . metoprolol succinate (TOPROL-XL) 25 MG 24 hr tablet Take 25 mg by mouth at bedtime.      . Multiple Vitamin (MULTIVITAMIN WITH MINERALS) TABS tablet Take 1 tablet by mouth at bedtime. Centrum Silver      . Multiple Vitamins-Minerals (OCUVITE PO) Take 1 tablet by mouth daily.       Marland Kitchen  nitroGLYCERIN (NITROSTAT) 0.4 MG SL tablet Place 1 tablet (0.4 mg total) under the tongue every 5 (five) minutes as needed for chest pain (up to 3 doses).  25 tablet  3  . traMADol (ULTRAM) 50 MG tablet Take 50-100 mg by mouth every 4 (four) hours as needed (pain).       Marland Kitchen warfarin (COUMADIN) 5 MG tablet Take 2.5-5 mg by mouth daily at 6 PM. Take 1/2 tablet (2.5 mg) on Mondays and Fridays; take 1 tablet (5 mg) on Sunday, Tuesday, Wednesday, Thursday, Saturday        Results for orders placed during the hospital encounter of 03/24/14 (from the past 48 hour(s))  GLUCOSE, CAPILLARY     Status: None   Collection Time    03/25/14  4:57 PM      Result Value Ref Range   Glucose-Capillary 91  70 - 99  mg/dL  CBC     Status: Abnormal   Collection Time    03/25/14  8:01 PM      Result Value Ref Range   WBC 9.4  4.0 - 10.5 K/uL   RBC 3.20 (*) 3.87 - 5.11 MIL/uL   Hemoglobin 8.5 (*) 12.0 - 15.0 g/dL   HCT 26.0 (*) 36.0 - 46.0 %   MCV 81.3  78.0 - 100.0 fL   MCH 26.6  26.0 - 34.0 pg   MCHC 32.7  30.0 - 36.0 g/dL   RDW 18.9 (*) 11.5 - 15.5 %   Platelets 275  150 - 400 K/uL  GLUCOSE, CAPILLARY     Status: Abnormal   Collection Time    03/25/14  9:32 PM      Result Value Ref Range   Glucose-Capillary 214 (*) 70 - 99 mg/dL  CBC     Status: Abnormal   Collection Time    03/26/14  3:09 AM      Result Value Ref Range   WBC 8.6  4.0 - 10.5 K/uL   RBC 3.12 (*) 3.87 - 5.11 MIL/uL   Hemoglobin 8.3 (*) 12.0 - 15.0 g/dL   HCT 25.4 (*) 36.0 - 46.0 %   MCV 81.4  78.0 - 100.0 fL   MCH 26.6  26.0 - 34.0 pg   MCHC 32.7  30.0 - 36.0 g/dL   RDW 19.1 (*) 11.5 - 15.5 %   Platelets 284  150 - 400 K/uL  BASIC METABOLIC PANEL     Status: Abnormal   Collection Time    03/26/14  3:09 AM      Result Value Ref Range   Sodium 138  137 - 147 mEq/L   Potassium 4.1  3.7 - 5.3 mEq/L   Chloride 90 (*) 96 - 112 mEq/L   CO2 28  19 - 32 mEq/L   Glucose, Bld 112 (*) 70 - 99 mg/dL   BUN 28 (*) 6 - 23 mg/dL   Creatinine, Ser 1.62 (*) 0.50 - 1.10 mg/dL   Calcium >15.0 (*) 8.4 - 10.5 mg/dL   Comment: CRITICAL RESULT CALLED TO, READ BACK BY AND VERIFIED WITH:     Dorothy Spark (RN) [phone] (RN) [phone] 03/26/2014 L. LOMAX   GFR calc non Af Amer 32 (*) >90 mL/min   GFR calc Af Amer 37 (*) >90 mL/min   Comment: (NOTE)     The eGFR has been calculated using the CKD EPI equation.     This calculation has not been validated in all clinical situations.     eGFR's persistently <90  mL/min signify possible Chronic Kidney     Disease.  MAGNESIUM     Status: None   Collection Time    03/26/14  3:09 AM      Result Value Ref Range   Magnesium 1.9  1.5 - 2.5 mg/dL  PHOSPHORUS     Status: Abnormal   Collection Time    03/26/14   3:09 AM      Result Value Ref Range   Phosphorus 5.5 (*) 2.3 - 4.6 mg/dL  PROTIME-INR     Status: Abnormal   Collection Time    03/26/14  3:09 AM      Result Value Ref Range   Prothrombin Time 16.9 (*) 11.6 - 15.2 seconds   INR 1.41  0.00 - 1.49  SEDIMENTATION RATE     Status: Abnormal   Collection Time    03/26/14  3:09 AM      Result Value Ref Range   Sed Rate >140 (*) 0 - 22 mm/hr  C-REACTIVE PROTEIN     Status: Abnormal   Collection Time    03/26/14  3:09 AM      Result Value Ref Range   CRP 5.9 (*) <0.60 mg/dL   Comment: Performed at [facility], CAPILLARY     Status: Abnormal   Collection Time    03/26/14  8:11 AM      Result Value Ref Range   Glucose-Capillary 151 (*) 70 - 99 mg/dL  GLUCOSE, CAPILLARY     Status: Abnormal   Collection Time    03/26/14 12:14 PM      Result Value Ref Range   Glucose-Capillary 163 (*) 70 - 99 mg/dL  BASIC METABOLIC PANEL     Status: Abnormal   Collection Time    03/26/14  3:35 PM      Result Value Ref Range   Sodium 137  137 - 147 mEq/L   Potassium 4.7  3.7 - 5.3 mEq/L   Chloride 92 (*) 96 - 112 mEq/L   CO2 30  19 - 32 mEq/L   Glucose, Bld 182 (*) 70 - 99 mg/dL   BUN 30 (*) 6 - 23 mg/dL   Creatinine, Ser 1.65 (*) 0.50 - 1.10 mg/dL   Calcium 13.2 (*) 8.4 - 10.5 mg/dL   Comment: CRITICAL RESULT CALLED TO, READ BACK BY AND VERIFIED WITH:     C DECNA,RN 1718 03/26/14 WBOND   GFR calc non Af Amer 31 (*) >90 mL/min   GFR calc Af Amer 36 (*) >90 mL/min   Comment: (NOTE)     The eGFR has been calculated using the CKD EPI equation.     This calculation has not been validated in all clinical situations.     eGFR's persistently <90 mL/min signify possible Chronic Kidney     Disease.  SAVE SMEAR     Status: None   Collection Time    03/26/14  3:35 PM      Result Value Ref Range   Smear Review SMEAR STAINED AND AVAILABLE FOR REVIEW    GLUCOSE, CAPILLARY     Status: Abnormal   Collection Time    03/26/14  5:11 PM       Result Value Ref Range   Glucose-Capillary 148 (*) 70 - 99 mg/dL  GLUCOSE, CAPILLARY     Status: Abnormal   Collection Time    03/26/14  9:39 PM      Result Value Ref Range   Glucose-Capillary 226 (*) 70 -  99 mg/dL  PROTIME-INR     Status: Abnormal   Collection Time    03/27/14  3:50 AM      Result Value Ref Range   Prothrombin Time 17.4 (*) 11.6 - 15.2 seconds   INR 1.47  0.00 - 1.49  CBC     Status: Abnormal   Collection Time    03/27/14  3:50 AM      Result Value Ref Range   WBC 8.5  4.0 - 10.5 K/uL   RBC 3.03 (*) 3.87 - 5.11 MIL/uL   Hemoglobin 8.0 (*) 12.0 - 15.0 g/dL   HCT 36.1 (*) 10.3 - 85.1 %   MCV 81.8  78.0 - 100.0 fL   MCH 26.4  26.0 - 34.0 pg   MCHC 32.3  30.0 - 36.0 g/dL   RDW 14.6 (*) 85.5 - 57.3 %   Platelets 250  150 - 400 K/uL  BASIC METABOLIC PANEL     Status: Abnormal   Collection Time    03/27/14  3:50 AM      Result Value Ref Range   Sodium 137  137 - 147 mEq/L   Potassium 4.2  3.7 - 5.3 mEq/L   Chloride 93 (*) 96 - 112 mEq/L   CO2 25  19 - 32 mEq/L   Glucose, Bld 176 (*) 70 - 99 mg/dL   BUN 28 (*) 6 - 23 mg/dL   Creatinine, Ser 7.17 (*) 0.50 - 1.10 mg/dL   Calcium 30.7 (*) 8.4 - 10.5 mg/dL   GFR calc non Af Amer 37 (*) >90 mL/min   GFR calc Af Amer 42 (*) >90 mL/min   Comment: (NOTE)     The eGFR has been calculated using the CKD EPI equation.     This calculation has not been validated in all clinical situations.     eGFR's persistently <90 mL/min signify possible Chronic Kidney     Disease.  GLUCOSE, CAPILLARY     Status: Abnormal   Collection Time    03/27/14  8:12 AM      Result Value Ref Range   Glucose-Capillary 170 (*) 70 - 99 mg/dL  GLUCOSE, CAPILLARY     Status: Abnormal   Collection Time    03/27/14 11:47 AM      Result Value Ref Range   Glucose-Capillary 196 (*) 70 - 99 mg/dL   Nm Bone Scan Whole Body  03/25/2014   CLINICAL DATA:  Back pain.  EXAM: NUCLEAR MEDICINE WHOLE BODY BONE SCAN  TECHNIQUE: Whole body anterior and  posterior images were obtained approximately 3 hours after intravenous injection of radiopharmaceutical.  RADIOPHARMACEUTICALS:  25 mCi Technetium-99 MDP  COMPARISON:  Chest CT 03/24/2014.  FINDINGS: Punctate area of increased activity noted over approximate the right posterior fifth and a left posterior ninth ribs. These correspond to old rib fractures. Given the mottled appearance of the bony structures of the chest on recent chest CT, myeloma should be considered and serum protein electrophoresis should be considered.  IMPRESSION: 1. Findings consistent with old right posterior fifth and left posterior ninth rib fractures. No evidence of metastatic disease. 2. Given the mottled appearance of the bony structures on recent chest CT, to evaluate for myeloma serum protein electrophoresis should be considered.   Electronically Signed   By: Maisie Fus  Register   On: 03/25/2014 16:11    Review of Systems  Constitutional: Negative for fever.  Respiratory: Positive for cough, hemoptysis, sputum production and shortness of breath.   Cardiovascular: Positive  for chest pain.       Chest wall pain: B rib fxa  Gastrointestinal: Positive for abdominal pain. Negative for nausea and vomiting.  Musculoskeletal: Positive for back pain and joint pain.       L 1 fx  Neurological: Positive for weakness. Negative for dizziness.  Psychiatric/Behavioral: Negative for substance abuse.    Blood pressure 145/69, pulse 107, temperature 98.5 F (36.9 C), temperature source Oral, resp. rate 28, height $RemoveBe'5\' 3"'JpHaDvedg$  (1.6 m), weight 86.183 kg (190 lb), SpO2 95.00%. Physical Exam  Constitutional: She is oriented to person, place, and time. She appears well-nourished.  Cardiovascular: Normal rate and regular rhythm.   No murmur heard. Respiratory: Effort normal. She has wheezes.  GI: Soft. Bowel sounds are normal.  Musculoskeletal: Normal range of motion.  Neurological: She is alert and oriented to person, place, and time.  Skin:  Skin is dry.  Psychiatric: She has a normal mood and affect. Her behavior is normal. Thought content normal.     Assessment/Plan Chronic bony pain New L1 and B ribs fxs Mottled appearance of bones on films suspicious for MM Now scheduled for Bone marrow bx 4/13 Pt aware of procedure benefits and risks and agreeable to proceed Consent signed andin chart  Lavonia Drafts 03/27/2014, 2:48 PM

## 2014-03-27 NOTE — Progress Notes (Signed)
Inpatient Diabetes Program Recommendations  AACE/ADA: New Consensus Statement on Inpatient Glycemic Control (2013)  Target Ranges:  Prepandial:   less than 140 mg/dL      Peak postprandial:   less than 180 mg/dL (1-2 hours)      Critically ill patients:  140 - 180 mg/dL   Inpatient Diabetes Program Recommendations Diet: add carbohydrate modified  Thank you  Raoul Pitch BSN, RN,CDE Inpatient Diabetes Coordinator [phone] (team pager)

## 2014-03-27 NOTE — Progress Notes (Signed)
Family Medicine Teaching Service Daily Progress Note Intern Pager: [phone]  Patient name: [name] Medical record number: [id number] Date of birth: [birth year] Age: 68 y.o. Gender: female  Primary Care Provider: Jenny Reichmann, MD Consultants: PCCM Code Status: full  Pt Overview and Major Events to Date:   Assessment and Plan: [name] is a 68 y.o. female presenting with hemoptysis and back pain of recent onset, causing significant physical impairment. PMH is significant for HTN, DM, STEMI, PE on coumadin  # Pulm: Hemoptysis, Bilateral infiltrates O2 requirement. CAP. Patient with recent onset hemoptysis in setting of bilateral infiltrates seen on CTA and supratherapeutic INR to 6 on coumadin. S/p vitK and 4uFFP and 2uPRBCs. Pt hemoglobin improved after second unit PRBCs to 8.3-> 8.0. INR 1.41->1.47 this morning. -PCCM signing off, appreciated assistance -monitor for development of gross blood -cont to assess for fluid overload -if hyperthrombotic 2/2 malignancy will need to reconsider anti-coag on d/c -switch to levaquin (s/p vanc/zosyn 4/7-4/9) and consider broading for  Aspiration PNA (with clinda) in light of hemoptysis if pt decompensates   # CV: HTN/Tachycardia - likely related to patients anemia and intravascular volume depletion as well as infection vs thyroid (TSH nml) vs CHF; EKG sinus tachy, given metoprolol 5IV and 12.5mg PO yesterday evening with minimal improvement -started metoprolol 25 BID this morning -monitor for hypotension   # FENGI: Hypoalbuminemic; constipated -general diet for now -will make NPO if possibility of procedures arises  -senna-docusate/miralax  # Renal: AKI (1.65-> 1.45 this morning)/hypercalcemia(improving 15-> 10.9)- extremely concerning for pathologic etiology; has 48hr duration -NS@200  to promote diuresis of Ca, calcitonin 100U, zometa 4mg  -will trend Cr  -reassess fluid status this afternoon  # Heme: Supratherapeutic INR  improved, Anemia with component iron deficiency (stable),Leukocytosis; Likely malignant process, concern for MM. Plan for BM biospy on Monday 4/13 by IR  -heme/onc consulted, appreciate recommendations/managament  -will eventually need colonoscopy (no documented record) -cbcs daily  -trend INR  -feraheme; then oral iron  # ID: Bilateral infiltrates, treating for CAP. Leukocytosis  -antibiotics per above  -monitor fever curve  -f/up BCx NGX2daysand UCx neg (obtained after initiation)  # MSK: Vertebral lumbar spine fracture - likely cause of [name] recent back pain, normal neurologic exam. Also complaining of recent onset of night sweats; bone scan suggestive of malignancy -required 40mg  morphine equivalent pain medication  -SPEP, UPEP, IFE  -monitor for cord sx/compression  Prophylaxis: none given supratherapeutic INR  Disposition: stabilization of hemoptysis, further work up of hypercalcemia, likely malignancy, BM biopsy 4/13  Subjective: Still coughing up some small chunks of blood and feels that she is working harder to breathe; pain controlled adequately; feels very overwhelmed with all new information  Objective: Temp:  [97.8 F (36.6 C)-100.2 F (37.9 C)] 99.7 F (37.6 C) (04/10 0800) Pulse Rate:  [65-144] 124 (04/10 0800) Resp:  [16-32] 32 (04/10 0600) BP: (134-175)/(63-101) 150/69 mmHg (04/10 0800) SpO2:  [91 %-99 %] 93 % (04/10 0800) Physical Exam: General: NAD, resting in bed with multiple family members surrounding HEENT: NCAT, MMM, [place] in place  Cardiovascular: tachycardic, regular rhythm, no murmurs appreciated  Respiratory: crackles throughout, coarse breath sounds improrved, increased WOB requiring  (subcostal retractions) Abdomen: soft, NT, ND, no guarding or rebound  Extremities: warm, well perfused, no edema  Skin: pale throughout, no additional rashes  Neuro: alert, PERRL, CN 2-12 intact grossly MSK: nontender in lumbar spine on deep palpation, no paraspinous  tenderness Psych: flat affect this morning; appears clinically depressed   Laboratory:  Recent Labs Lab 03/25/14 2001 03/26/14 0309 03/27/14 0350  WBC 9.4 8.6 8.5  HGB 8.5* 8.3* 8.0*  HCT 26.0* 25.4* 24.8*  PLT 275 284 250    Recent Labs Lab 03/24/14 1905  03/26/14 0309 03/26/14 1535 03/27/14 0350  NA 134*  < > 138 137 137  K 4.8  < > 4.1 4.7 4.2  CL 92*  < > 90* 92* 93*  CO2 23  < > 28 30 25   BUN 28*  < > 28* 30* 28*  CREATININE 1.14*  < > 1.62* 1.65* 1.45*  CALCIUM 12.3*  < > >15.0* 13.2* 10.9*  PROT 11.2*  --   --   --   --   BILITOT 0.4  --   --   --   --   ALKPHOS 70  --   --   --   --   ALT 9  --   --   --   --   AST 36  --   --   --   --   GLUCOSE 111*  < > 112* 182* 176*  < > = values in this interval not displayed.  INR 6.04 UA large blood  Imaging/Diagnostic Tests: CT angio IMPRESSION: 1. No evidence of pulmonary embolus. 2. Relatively diffuse multifocal pneumonia noted bilaterally. 3. Diffuse coronary artery calcifications seen. 4. Diffusely heterogeneous appearance to the attenuation of visualized osseous structures, somewhat more prominent than on the prior study and new from 2010. Would correlate for evidence of underlying systemic condition, or possibly metastatic disease. Bone scan or correlation with lab findings could be considered for further evaluation, as deemed clinically appropriate.  Bone scan IMPRESSION:  1. Findings consistent with old right posterior fifth and left  posterior ninth rib fractures. No evidence of metastatic disease.  2. Given the mottled appearance of the bony structures on recent  chest CT, to evaluate for myeloma serum protein electrophoresis  should be considered  Langston Masker, MD 03/27/2014, 9:20 AM PGY-1, [place] Intern pager: [phone], text pages welcome

## 2014-03-28 DIAGNOSIS — D759 Disease of blood and blood-forming organs, unspecified: Secondary | ICD-10-CM

## 2014-03-28 LAB — BASIC METABOLIC PANEL
BUN: 21 mg/dL (ref 6–23)
CO2: 24 meq/L (ref 19–32)
Calcium: 10 mg/dL (ref 8.4–10.5)
Chloride: 96 mEq/L (ref 96–112)
Creatinine, Ser: 1.18 mg/dL — ABNORMAL HIGH (ref 0.50–1.10)
GFR calc non Af Amer: 47 mL/min — ABNORMAL LOW (ref >90)
GFR, EST AFRICAN AMERICAN: 54 mL/min — AB (ref >90)
Glucose, Bld: 95 mg/dL (ref 70–99)
POTASSIUM: 4 meq/L (ref 3.7–5.3)
SODIUM: 137 meq/L (ref 137–147)

## 2014-03-28 LAB — CBC
HCT: 22.6 % — ABNORMAL LOW (ref 36.0–46.0)
Hemoglobin: 7.1 g/dL — ABNORMAL LOW (ref 12.0–15.0)
MCH: 25.9 pg — ABNORMAL LOW (ref 26.0–34.0)
MCHC: 31.4 g/dL (ref 30.0–36.0)
MCV: 82.5 fL (ref 78.0–100.0)
Platelets: 169 10*3/uL (ref 150–400)
RBC: 2.74 MIL/uL — AB (ref 3.87–5.11)
RDW: 19.6 % — ABNORMAL HIGH (ref 11.5–15.5)
WBC: 6.3 10*3/uL (ref 4.0–10.5)

## 2014-03-28 LAB — GLUCOSE, CAPILLARY
Glucose-Capillary: 108 mg/dL — ABNORMAL HIGH (ref 70–99)
Glucose-Capillary: 171 mg/dL — ABNORMAL HIGH (ref 70–99)
Glucose-Capillary: 213 mg/dL — ABNORMAL HIGH (ref 70–99)
Glucose-Capillary: 165 mg/dL — ABNORMAL HIGH (ref 70–99)

## 2014-03-28 LAB — PROTIME-INR
INR: 1.59 — AB (ref 0.00–1.49)
PROTHROMBIN TIME: 18.5 s — AB (ref 11.6–15.2)

## 2014-03-28 MED ORDER — CALCITONIN (SALMON) 200 UNIT/ACT NA SOLN
1.0000 | Freq: Every day | NASAL | Status: DC
  Administered 2014-03-28 – 2014-03-30 (×3): 1 via NASAL
  Filled 2014-03-28: qty 3.7

## 2014-03-28 MED ORDER — OXYCODONE HCL 5 MG PO TABS
7.5000 mg | ORAL_TABLET | ORAL | Status: DC | PRN
  Administered 2014-03-28 – 2014-03-29 (×4): 7.5 mg via ORAL
  Filled 2014-03-28 (×4): qty 2

## 2014-03-28 MED ORDER — FUROSEMIDE 40 MG PO TABS
40.0000 mg | ORAL_TABLET | Freq: Once | ORAL | Status: AC
  Administered 2014-03-28: 40 mg via ORAL
  Filled 2014-03-28: qty 1

## 2014-03-28 MED ORDER — PHYTONADIONE 5 MG PO TABS
2.5000 mg | ORAL_TABLET | Freq: Every day | ORAL | Status: AC
  Administered 2014-03-28 – 2014-03-29 (×2): 2.5 mg via ORAL
  Filled 2014-03-28 (×3): qty 1

## 2014-03-28 MED ORDER — POLYETHYLENE GLYCOL 3350 17 G PO PACK
17.0000 g | PACK | Freq: Every day | ORAL | Status: DC
  Filled 2014-03-28 (×3): qty 1

## 2014-03-28 MED ORDER — SENNOSIDES-DOCUSATE SODIUM 8.6-50 MG PO TABS
2.0000 | ORAL_TABLET | Freq: Every day | ORAL | Status: DC
  Administered 2014-03-29: 2 via ORAL
  Filled 2014-03-28 (×3): qty 2

## 2014-03-28 NOTE — Progress Notes (Signed)
I have seen and examined this patient. I have discussed with Dr Dianah Field.  I agree with their findings and plans as documented in their progress note.  Patient SPEP shows an M-spike of 3.8 g/dL with elevation of IgG serum levels which would be consistent with an Multiple Myeloma.  IFE results still pending.   Back pain control better with Dilaudid 0.5 mg IV than oxycodone 5 mg per oral.  Pt tolerating oxycodone without confusion 4 stools yesterday with the last two being excessively loose  Serum calcium in normal range.  Stopped Normal Saline calciuresis.  Day number 3 s/p Zoledronic acid dose.  Started Calcitonin nasal spray today for osteoporotic fracture pain.  Will increase oxycodone to 7.5 mg doses and assess control and tolerance Decrease Miralax to once a day and Senna to two tab once a day, reassess frequency and consistency of stools.   Await BM bx on 03/30/14.

## 2014-03-28 NOTE — Progress Notes (Signed)
Family Medicine Teaching Service Daily Progress Note Intern Pager: [phone]  Patient name: [name] Medical record number: [id number] Date of birth: 12-19-1945 Age: 68 y.o. Gender: female  Primary Care Provider: Jenny Reichmann, MD Consultants: PCCM (signed off) Code Status: full  Pt Overview and Major Events to Date:  - Hemoptysis thought old blood. PCCM signed off. - CTA with multifocal infiltrates - treating with levaquin - Bone irregularities on CTA - raising concern for MM. Bx planned for 4/13 - 4/10 developed fluid overload after IV fluids for hypercalcemia. Lasix x 1.  Assessment and Plan: JADELIN Taylor is a 68 y.o. female presenting with hemoptysis and back pain of recent onset, causing significant physical impairment. PMH is significant for HTN, DM, STEMI, provoked PE on coumadin.  # Pulm: Hemoptysis, Bilateral infiltrates, O2 requirement thought CAP. Patient with recent onset hemoptysis in setting of bilateral infiltrates seen on CTA and supratherapeutic INR to 6 on coumadin. S/p vitK and 4uFFP and 2uPRBCs. Pt hemoglobin improved after second unit PRBCs to 8.3-> 8.0. INR 1.41->1.47->1.59 this morning. -PCCM signing off, will re-consult if develops frank bloody hemoptysis -monitor for development of gross blood. None reported. -Fluid overload yesterday - dosed lasix 20mg  x 1. Will dose again in setting of continued crackles at 40mg . -if hyperthrombotic 2/2 malignancy will need to reconsider anti-coag on d/c.  -switch to levaquin (s/p vanc/zosyn 4/7-4/9) and consider broading for aspiration PNA (with clinda) in light of hemoptysis if pt decompensates.  - H/o PE, likely provoked, s/p sufficient coumadin therapy. Will not continue at this time.  # CV: HTN/Tachycardia - likely related to patients anemia and intravascular volume depletion as well as infection vs thyroid (TSH nml) vs CHF; EKG sinus tachy, given metoprolol 5IV and 12.5mg PO 4/9 evening with minimal  improvement -started metoprolol 25 BID 4/10 AM -monitor for hypotension; currently BP normal -EKG this morning with frequent PVCs and continued sinus tach. Asx.  # Renal: AKI (1.65-> 1.45-->1.18)/hypercalcemia(improving 15-> 10.0)- extremely concerning for pathologic etiology; has 48hr duration -Previous NS@200  to promote diuresis of Ca (SLIV 4/10 due to fluid overload), calcitonin 100U, zometa 4mg  -will trend Cr  -Lasix 20mg  PO x 1 yesterday, 40mg  today given continued crackles and not much UOP.  # Heme: Supratherapeutic INR improved, Anemia with component iron deficiency. Down to 7 from 8, possibly dilutional from a few days of IV hydration, Leukocytosis improved; Likely malignant process, concern for MM given mottled appearance of bones on films. Plan for BM biospy on Monday 4/13 by IR  -heme/onc consulted, appreciate recommendations -will eventually need colonoscopy (no documented record) -cbcs daily -trend INR  -feraheme; then oral iron  # ID: Bilateral infiltrates, treating for CAP. Leukocytosis  -antibiotics per above  -monitor fever curve  -f/up BCx NGX3daysand UCx neg (obtained after initiation)  # MSK: Vertebral lumbar spine fracture - likely cause of her recent back pain, normal neurologic exam. Also complaining of recent onset of night sweats; bone scan suggestive of malignancy -required 40mg  morphine equivalent pain medication  -SPEP, UPEP, IFE  -monitor for cord sx/compression  # FENGI: Hypoalbuminemic; constipated -general diet for now -will make NPO if possibility of procedures arises  -senna-docusate/miralax  Prophylaxis: none given supratherapeutic INR  Disposition: BM biopsy 4/13  Subjective: Reports no further issues with coughing overnight, back pain is stable, denies chest pain.   Objective: Temp:  [98.5 F (36.9 C)-99.7 F (37.6 C)] 98.8 F (37.1 C) (04/11 0403) Pulse Rate:  [96-126] 96 (04/11 0403) Resp:  [15-28] 24 (04/11  0403) BP:  (123-150)/(64-79) 123/64 mmHg (04/11 0403) SpO2:  [93 %-99 %] 96 % (04/11 0403) Physical Exam: General: NAD, resting in bed with family member at bedside, tired-appearing HEENT: NCAT, MMM, [place] in place  Cardiovascular: tachycardic with ?PVC, no murmurs appreciated, 2+ bilateral DP pulses Respiratory: normal WOB, crackles bilaterally in mid-lower fields, R>L Abdomen: soft, NT, ND, no guarding or rebound, obese Extremities: warm, well perfused, no edema  Skin: pale throughout, no additional rashes  Neuro: alert, PERRL, CN 2-12 intact grossly MSK: Lumbar paraspinal mild tenderness bilaterally Psych: flat affect this morning; appears clinically depressed   Laboratory:  Recent Labs Lab 03/26/14 0309 03/27/14 0350 03/28/14 0352  WBC 8.6 8.5 6.3  HGB 8.3* 8.0* 7.1*  HCT 25.4* 24.8* 22.6*  PLT 284 250 169    Recent Labs Lab 03/24/14 1905  03/27/14 0350 03/27/14 1420 03/28/14 0352  NA 134*  < > 137 137 137  K 4.8  < > 4.2 4.2 4.0  CL 92*  < > 93* 96 96  CO2 23  < > 25 24 24   BUN 28*  < > 28* 24* 21  CREATININE 1.14*  < > 1.45* 1.30* 1.18*  CALCIUM 12.3*  < > 10.9* 10.2 10.0  PROT 11.2*  --   --   --   --   BILITOT 0.4  --   --   --   --   ALKPHOS 70  --   --   --   --   ALT 9  --   --   --   --   AST 36  --   --   --   --   GLUCOSE 111*  < > 176* 175* 95  < > = values in this interval not displayed.   Recent Labs Lab 03/24/14 1905 03/25/14 0850 03/26/14 0309 03/27/14 0350 03/28/14 0352  INR 6.04* 1.39 1.41 1.47 1.59*    UA large blood on adm  Imaging/Diagnostic Tests: CT angio IMPRESSION: 1. No evidence of pulmonary embolus. 2. Relatively diffuse multifocal pneumonia noted bilaterally. 3. Diffuse coronary artery calcifications seen. 4. Diffusely heterogeneous appearance to the attenuation of visualized osseous structures, somewhat more prominent than on the prior study and new from 2010. Would correlate for evidence of underlying systemic condition, or possibly  metastatic disease. Bone scan or correlation with lab findings could be considered for further evaluation, as deemed clinically appropriate.  Bone scan IMPRESSION:  1. Findings consistent with old right posterior fifth and left  posterior ninth rib fractures. No evidence of metastatic disease.  2. Given the mottled appearance of the bony structures on recent  chest CT, to evaluate for myeloma serum protein electrophoresis  should be considered  EKG 4/11 - Sinus tach, frequent PVC, otherwise stable from prior (q waves II and III)  Hilton Sinclair, MD 03/28/2014, 7:01 AM PGY-2, [place] Intern pager: [phone], text pages welcome

## 2014-03-29 DIAGNOSIS — M549 Dorsalgia, unspecified: Secondary | ICD-10-CM

## 2014-03-29 DIAGNOSIS — C9000 Multiple myeloma not having achieved remission: Secondary | ICD-10-CM

## 2014-03-29 LAB — GLUCOSE, CAPILLARY
GLUCOSE-CAPILLARY: 161 mg/dL — AB (ref 70–99)
Glucose-Capillary: 222 mg/dL — ABNORMAL HIGH (ref 70–99)
Glucose-Capillary: 158 mg/dL — ABNORMAL HIGH (ref 70–99)
Glucose-Capillary: 185 mg/dL — ABNORMAL HIGH (ref 70–99)
Glucose-Capillary: 180 mg/dL — ABNORMAL HIGH (ref 70–99)

## 2014-03-29 LAB — BASIC METABOLIC PANEL
BUN: 26 mg/dL — AB (ref 6–23)
CHLORIDE: 93 meq/L — AB (ref 96–112)
CO2: 22 mEq/L (ref 19–32)
Calcium: 8.7 mg/dL (ref 8.4–10.5)
Creatinine, Ser: 1.14 mg/dL — ABNORMAL HIGH (ref 0.50–1.10)
GFR calc non Af Amer: 49 mL/min — ABNORMAL LOW (ref >90)
GFR, EST AFRICAN AMERICAN: 57 mL/min — AB (ref >90)
GLUCOSE: 153 mg/dL — AB (ref 70–99)
POTASSIUM: 4.6 meq/L (ref 3.7–5.3)
Sodium: 133 mEq/L — ABNORMAL LOW (ref 137–147)

## 2014-03-29 LAB — CBC
HCT: 23.7 % — ABNORMAL LOW (ref 36.0–46.0)
HEMOGLOBIN: 7.4 g/dL — AB (ref 12.0–15.0)
MCH: 25.7 pg — ABNORMAL LOW (ref 26.0–34.0)
MCHC: 31.2 g/dL (ref 30.0–36.0)
MCV: 82.3 fL (ref 78.0–100.0)
Platelets: 159 10*3/uL (ref 150–400)
RBC: 2.88 MIL/uL — ABNORMAL LOW (ref 3.87–5.11)
RDW: 19.2 % — AB (ref 11.5–15.5)
WBC: 10.5 10*3/uL (ref 4.0–10.5)

## 2014-03-29 LAB — PROTIME-INR
INR: 1.43 (ref 0.00–1.49)
Prothrombin Time: 17.1 seconds — ABNORMAL HIGH (ref 11.6–15.2)

## 2014-03-29 NOTE — Progress Notes (Signed)
I have seen and examined this patient. I have discussed with Dr Caryl Bis.  I agree with their findings and plans as documented in their progress note.  Awaiting BM Bx tomorrow with likely discharge soon after.  Adequate pain control with oxycodone 7.5 mg per oral every 4 hours as needed. Pt tolerating this dose and schedule.   Pt's urine IFE showsmonoclonal IgA Heavy Chain with Kappa light Chains consistent with Multiple Myeloma diagnosis.

## 2014-03-29 NOTE — Discharge Summary (Signed)
[name] Discharge Summary  Patient name: [name] Medical record number: [id number] Date of birth: 1946-02-15 Age: 68 y.o. Gender: female Date of Admission: 03/24/2014  Date of Discharge: 03/31/14 Admitting Physician: Blane Ohara McDiarmid, MD  Primary Care Provider: Jenny Reichmann, MD Consultants: ONC, PCCM  Indication for Hospitalization: Hemoptysis; Back pain  Discharge Diagnoses/Problem List:  Patient Active Problem List   Diagnosis Date Noted  . Hypercalcemia 03/25/2014  . Major hemoptysis 03/25/2014  . Acute blood loss anemia 03/25/2014  . Vertebral fracture, closed 03/25/2014  . Pneumonia, organism unspecified 03/24/2014  . Onychomycosis 04/29/2013  . Pain in joint, ankle and foot 04/29/2013  . Benign hypertensive heart disease without heart failure 10/15/2012  . Type II or unspecified type diabetes mellitus without mention of complication, uncontrolled   . HTN (hypertension)   . Obesity   . Osteoarthritis, knee    Disposition: CIR  Discharge Condition: stable, improved  Discharge Exam:  BP 117/60  Pulse 91  Temp(Src) 98.2 F (36.8 C) (Oral)  Resp 20  Ht 5\' 3"  (1.6 m)  Wt 181 lb 1.6 oz (82.146 kg)  BMI 32.09 kg/m2  SpO2 97% General: NAD, resting in bed with husband at bedside, tired-appearing  HEENT: NCAT, [place] in place (turned down to 1.5L while in the room)  Cardiovascular: rrr, no murmurs appreciated  Respiratory: normal WOB, CTA in anterior fields  Abdomen: soft, NT, ND, no guarding or rebound, obese  Extremities: warm, well perfused, no edema; back with bandaid covered; no drainage or active bleeding  Psych: more interactive this morning; appears clinically depressed   Brief Hospital Course:  [name] is a 68 y.o. female presenting with hemoptysis and back pain of recent onset, causing significant physical impairment. PMH is significant for HTN, DM, STEMI, provoked PE on coumadin.   # Pulm: Patient with recent  onset hemoptysis in setting of bilateral infiltrates seen on CTA and supratherapeutic INR to 6 on coumadin. Patient also with drop in Hgb to 7.4 from previous baseline of 10. Additionally with new O2 requirement thought to be CAP. PCCM consulted for possible bronch in the setting of hemoptysis, no indication for bronch given lack of bright red bleeding. Received vitK and 4uFFP and 2uPRBCs with improvement in INR to 1.39, stable at 1.59 and hemoglobin initially to 8.3, stable around 7.2 by time of discharge. Pt hemodynamically stable by time of discharge to CIR on 4/14. Anticoag held, discussed with hemeonc who recs not starting at this time.   # CV: Hypotension/Tachycardia present on admission likely related to patients anemia and intravascular volume depletion as well as infection vs thyroid (TSH nml) vs CHF.  EKG obtained sinus tach, given metoprolol 5IV and 12.5mg  PO 4/9 evening. Improved started metoprolol 25 BID 4/10 AM. Monitored closely for hypotension. BP appropriate at time of d/c.   # Renal: Initially with AKI which trended down through admission 1.65-> 1.45-->1.18-->1.14. Rehydrated with NS (initially at 200NS given hypercalcemia of >15. Hypercalcemia extremely concerning for pathologic etiology. In addition to IVF, given calcitonin 100IU (redosed for lab values and sx) and zometa 4mg . With improvement to calcium of 8.1 on d/c. Calcitonin not continued.  # Heme: Initially with supratherapeutic INR to 6, 2/2 coumadin and active hemoptysis. GIven vit K, FFP and RBCs (see above) Pt also with anemia likely multifactorial, component of iron deficiency (low ferritin and iron), gross blood loss and malignant process. GIven feraheme then started on oral iron. Concern for MM given mottled appearance of  bones CTA and nuc med bone scan. Sed rate and CRP elevated. M spike detected on electrophoresis. Heme/onc consulted. Planned BM biospy on Monday 4/13. Aspirate, FISH, cytogenics sent. Dr. Jana Hakim to f/up with  patient in CIR tomorrow. Will discuss outpt appointment, treatment options, and anticoagulation at that time.    # ID: Bilateral infiltrates noted on CTA assc with leukocytosis and fever. Decision made to treat for CAP. Started on vanc/zosyn. Bcx NGX5 days UCx neg obtained after initiation of abx. Switched to levaquin. Completed a 6 day course.   # MSK: Pt with vertebral lumbar spine fracture (see above) - likely cause of her recent worsening back pain. Normal neurologic exam throughout admission, monitored for signs of cord sx/compression. Initially on dilaudid for pain control switched to oxy. On discharge pain controlled on oxy.   # FENGI: Initially hypoalbuminemic. Likely 2/2 poor PO intake and confirmed malignancy. Also c/o constipation (suspect related to hypercalcemia, given initial levels >15). Started on senokot/miralax combination.   Issues for Follow Up:  1. Further heme/onc workup and treatment options 2. Restarting anticoagulation 3. Rehab goals and possible need for [facility] services post CIR  Significant Procedures: BM biopsy  Significant Labs and Imaging:   Recent Labs Lab 03/29/14 0530 03/30/14 0554 03/31/14 0457  WBC 10.5 11.2* 11.7*  HGB 7.4* 7.7* 7.2*  HCT 23.7* 24.4* 22.5*  PLT 159 145* 145*    Recent Labs Lab 03/24/14 1905 03/25/14 0850 03/26/14 0309  03/27/14 1420 03/28/14 0352 03/29/14 0530 03/30/14 0554 03/31/14 0457  NA 134* 138 138  < > 137 137 133* 136* 137  K 4.8 4.2 4.1  < > 4.2 4.0 4.6 4.1 4.2  CL 92* 91* 90*  < > 96 96 93* 93* 96  CO2 23 28 28   < > 24 24 22 26 25   GLUCOSE 111* 92 112*  < > 175* 95 153* 148* 136*  BUN 28* 24* 28*  < > 24* 21 26* 26* 27*  CREATININE 1.14* 1.24* 1.62*  < > 1.30* 1.18* 1.14* 1.19* 1.28*  CALCIUM 12.3* 12.4*  11.1* >15.0*  < > 10.2 10.0 8.7 8.6 8.1*  MG  --   --  1.9  --   --   --   --   --   --   PHOS  --   --  5.5*  --   --   --   --   --   --   ALKPHOS 70  --   --   --   --   --   --   --   --   AST 36  --    --   --   --   --   --   --   --   ALT 9  --   --   --   --   --   --   --   --   ALBUMIN 2.2*  --   --   --   --   --   --   --   --   < > = values in this interval not displayed.   Recent Labs Lab 03/27/14 0350 03/28/14 0352 03/29/14 0530 03/30/14 0554 03/31/14 0457  INR 1.47 1.59* 1.43 1.21 1.39    UA large blood on adm  Imaging/Diagnostic Tests:  CT angio  IMPRESSION: 1. No evidence of pulmonary embolus. 2. Relatively diffuse multifocal pneumonia noted bilaterally. 3. Diffuse coronary artery calcifications seen. 4. Diffusely heterogeneous appearance  to the attenuation of visualized osseous structures, somewhat more prominent than on the prior study and new from 2010. Would correlate for evidence of underlying systemic condition, or possibly metastatic disease. Bone scan or correlation with lab findings could be considered for further evaluation, as deemed clinically appropriate.  Bone scan  IMPRESSION:  1. Findings consistent with old right posterior fifth and left  posterior ninth rib fractures. No evidence of metastatic disease.  2. Given the mottled appearance of the bony structures on recent  chest CT, to evaluate for myeloma serum protein electrophoresis  should be considered  EKG 4/11 - Sinus tach, frequent PVC, otherwise stable from prior (q waves II and III)   Results/Tests Pending at Time of Discharge: MM biopsy  Discharge Medications:    Medication List    STOP taking these medications       warfarin 5 MG tablet  Commonly known as:  COUMADIN      TAKE these medications       aspirin EC 81 MG tablet  Take 81 mg by mouth at bedtime.     glimepiride 2 MG tablet  Commonly known as:  AMARYL  Take 4 mg by mouth 2 (two) times daily.     losartan 25 MG tablet  Commonly known as:  COZAAR  Take 25 mg by mouth daily.     metFORMIN 1000 MG tablet  Commonly known as:  GLUCOPHAGE  Take 1 tablet (1,000 mg total) by mouth 2 (two) times daily.     methocarbamol  500 MG tablet  Commonly known as:  ROBAXIN  Take 500-1,000 mg by mouth every 4 (four) hours as needed for muscle spasms.     metoprolol succinate 25 MG 24 hr tablet  Commonly known as:  TOPROL-XL  Take 25 mg by mouth at bedtime.     multivitamin with minerals Tabs tablet  Take 1 tablet by mouth at bedtime. Centrum Silver     MUSCLE RUB EX  Apply 1 application topically 3 (three) times daily as needed (back pain).     nitroGLYCERIN 0.4 MG SL tablet  Commonly known as:  NITROSTAT  Place 1 tablet (0.4 mg total) under the tongue every 5 (five) minutes as needed for chest pain (up to 3 doses).     OCUVITE PO  Take 1 tablet by mouth daily.     oxyCODONE 15 MG immediate release tablet  Commonly known as:  ROXICODONE  Take 0.5 tablets (7.5 mg total) by mouth every 4 (four) hours as needed for severe pain.     polyethylene glycol packet  Commonly known as:  MIRALAX / GLYCOLAX  Take 17 g by mouth daily.     traMADol 50 MG tablet  Commonly known as:  ULTRAM  Take 50-100 mg by mouth every 4 (four) hours as needed (pain).        Discharge Instructions: Please refer to Patient Instructions section of EMR for full details.  Patient was counseled important signs and symptoms that should prompt return to medical care, changes in medications, dietary instructions, activity restrictions, and follow up appointments.   Follow-Up Appointments: Follow-up Information   Schedule an appointment as soon as possible for a visit with DAUB, STEVE A, MD. (post discharge from [facility])    Specialty:  Family Medicine   Contact information:   [facility] [address] [phone]       Langston Masker, MD 03/31/2014, 1:03 PM PGY-1, [place]

## 2014-03-29 NOTE — Progress Notes (Signed)
Family Medicine Teaching Service Daily Progress Note Intern Pager: [phone]  Patient name: [name] Medical record number: [id number] Date of birth: Apr 06, 1946 Age: 68 y.o. Gender: female  Primary Care Provider: Jenny Reichmann, MD Consultants: PCCM (signed off) Code Status: full  Pt Overview and Major Events to Date:  - Hemoptysis thought old blood. PCCM signed off. - CTA with multifocal infiltrates - treating with levaquin - Bone irregularities on CTA - raising concern for MM. Bx planned for 4/13 - 4/10 developed fluid overload after IV fluids for hypercalcemia. Lasix x 1.  Assessment and Plan: [name] is a 68 y.o. female presenting with hemoptysis and back pain of recent onset, causing significant physical impairment. PMH is significant for HTN, DM, STEMI, provoked PE on coumadin.  # Pulm: Hemoptysis, Bilateral infiltrates, O2 requirement thought CAP. Patient with recent onset hemoptysis in setting of bilateral infiltrates seen on CTA and supratherapeutic INR to 6 on coumadin. S/p vitK and 4uFFP and 2uPRBCs. Pt hemoglobin improved after second unit PRBCs to 8.3-> 8.0. INR 1.41->1.47->1.59 this morning. -PCCM signed off, will re-consult if develops frank bloody hemoptysis -Will monitor today for volume overload - consider redosing lasix if has increased WOB -if hyperthrombotic 2/2 malignancy will need to reconsider anti-coag on d/c.  -continue levaquin PO    # CV: HTN/Tachycardia - likely related to patients anemia and intravascular volume depletion as well as infection vs thyroid (TSH nml) vs CHF; EKG sinus tachy, given metoprolol 5IV and 12.5mg PO 4/9 evening. Improved today. -started metoprolol 25 BID 4/10 AM -monitor for hypotension; currently BP normal  # Renal: AKI (1.65-> 1.45-->1.18-->1.14)/hypercalcemia(improving 15-> 10.0)- extremely concerning for pathologic etiology -s/p NS and zometa 4 mg for hypercalcemia, continue calcitonin for hypercalcemia and  pain -continue to monitor calcium - if continues to drop will d/c calcitonin -will trend Cr  -monitor fluid status  # Heme: Supratherapeutic INR improved, Anemia with component iron deficiency. Stable today, possibly dilutional from a few days of IV hydration, Leukocytosis improved; Likely malignant process, concern for MM given mottled appearance of bones on films. Sed rate and CRP elevated. M spike detected. Plan for BM biospy on Monday 4/13 by IR  -heme/onc consulted, appreciate recommendations -will eventually need colonoscopy (no documented record) -cbcs daily -trend INR  -feraheme; then oral iron -s/p additional vit K as INR was trending up yesterday  # ID: Bilateral infiltrates, treating for CAP. Leukocytosis. -continue levaquin  -monitor fever curve  -f/up BCx NGx4daysand UCx neg (obtained after initiation)  # MSK: Vertebral lumbar spine fracture - likely cause of her recent back pain, normal neurologic exam.  -continue oxycodone 7.5 mg for pain  -SPEP, UPEP, IFE - indicate MM -monitor for cord sx/compression  # FENGI: Hypoalbuminemic; constipated -general diet for now -will make NPO if possibility of procedures arises  -senna-docusate/miralax  Prophylaxis: SCDs, no chemical prophylaxis given recent bleed  Disposition: BM biopsy 4/13  Subjective: No complaints. Is breathing fine. Some diarrhea yesterday. Notes that this has been a lot to take in.  Objective: Temp:  [97.5 F (36.4 C)-98.2 F (36.8 C)] 98.2 F (36.8 C) (04/12 0400) Pulse Rate:  [85-99] 99 (04/12 0400) Resp:  [17-20] 17 (04/12 0400) BP: (110-118)/(50-60) 110/50 mmHg (04/12 0400) SpO2:  [96 %-97 %] 96 % (04/12 0400) Physical Exam: General: NAD, resting in bed with family member at bedside, tired-appearing HEENT: NCAT, [place] in place  Cardiovascular: rrr, no murmurs appreciated Respiratory: normal WOB, crackles bilaterally in mid-lower fields, R>L Abdomen: soft, NT, ND, no  guarding or rebound,  obese Extremities: warm, well perfused, no edema  Psych: flat affect this morning; appears clinically depressed   Laboratory:  Recent Labs Lab 03/27/14 0350 03/28/14 0352 03/29/14 0530  WBC 8.5 6.3 10.5  HGB 8.0* 7.1* 7.4*  HCT 24.8* 22.6* 23.7*  PLT 250 169 159    Recent Labs Lab 03/24/14 1905  03/27/14 1420 03/28/14 0352 03/29/14 0530  NA 134*  < > 137 137 133*  K 4.8  < > 4.2 4.0 4.6  CL 92*  < > 96 96 93*  CO2 23  < > 24 24 22   BUN 28*  < > 24* 21 26*  CREATININE 1.14*  < > 1.30* 1.18* 1.14*  CALCIUM 12.3*  < > 10.2 10.0 8.7  PROT 11.2*  --   --   --   --   BILITOT 0.4  --   --   --   --   ALKPHOS 70  --   --   --   --   ALT 9  --   --   --   --   AST 36  --   --   --   --   GLUCOSE 111*  < > 175* 95 153*  < > = values in this interval not displayed.   Recent Labs Lab 03/25/14 0850 03/26/14 0309 03/27/14 0350 03/28/14 0352 03/29/14 0530  INR 1.39 1.41 1.47 1.59* 1.43    UA large blood on adm  Imaging/Diagnostic Tests: CT angio IMPRESSION: 1. No evidence of pulmonary embolus. 2. Relatively diffuse multifocal pneumonia noted bilaterally. 3. Diffuse coronary artery calcifications seen. 4. Diffusely heterogeneous appearance to the attenuation of visualized osseous structures, somewhat more prominent than on the prior study and new from 2010. Would correlate for evidence of underlying systemic condition, or possibly metastatic disease. Bone scan or correlation with lab findings could be considered for further evaluation, as deemed clinically appropriate.  Bone scan IMPRESSION:  1. Findings consistent with old right posterior fifth and left  posterior ninth rib fractures. No evidence of metastatic disease.  2. Given the mottled appearance of the bony structures on recent  chest CT, to evaluate for myeloma serum protein electrophoresis  should be considered  EKG 4/11 - Sinus tach, frequent PVC, otherwise stable from prior (q waves II and III)  Leone Haven, MD 03/29/2014, 8:17 AM PGY-2, [place] Intern pager: [phone], text pages welcome

## 2014-03-29 NOTE — Progress Notes (Signed)
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 I gave her and her son the information. She is (I believe) scheduled for bone marrow biopsy 4/13--to be sent for aspirate, biopsy, cytogenetics anf FISH studies.  She tells me she has significant back pain when trying to ambulate of use the [facility]. Would kyphoplasty help?  I will arrange for outpatient follow up in my office. Please let me knowif you anticipate a prolongued admission in which case I will cosnider staring her therapy as inpatient.

## 2014-03-30 ENCOUNTER — Inpatient Hospital Stay (HOSPITAL_COMMUNITY): Payer: Medicare Other

## 2014-03-30 ENCOUNTER — Other Ambulatory Visit: Payer: Self-pay | Admitting: Oncology

## 2014-03-30 DIAGNOSIS — R5383 Other fatigue: Secondary | ICD-10-CM

## 2014-03-30 DIAGNOSIS — E1165 Type 2 diabetes mellitus with hyperglycemia: Secondary | ICD-10-CM

## 2014-03-30 DIAGNOSIS — R5381 Other malaise: Secondary | ICD-10-CM

## 2014-03-30 DIAGNOSIS — IMO0001 Reserved for inherently not codable concepts without codable children: Secondary | ICD-10-CM

## 2014-03-30 LAB — BASIC METABOLIC PANEL
BUN: 26 mg/dL — ABNORMAL HIGH (ref 6–23)
CO2: 26 mEq/L (ref 19–32)
Calcium: 8.6 mg/dL (ref 8.4–10.5)
Chloride: 93 mEq/L — ABNORMAL LOW (ref 96–112)
Creatinine, Ser: 1.19 mg/dL — ABNORMAL HIGH (ref 0.50–1.10)
GFR calc Af Amer: 54 mL/min — ABNORMAL LOW (ref >90)
GFR calc non Af Amer: 47 mL/min — ABNORMAL LOW (ref >90)
Glucose, Bld: 148 mg/dL — ABNORMAL HIGH (ref 70–99)
Potassium: 4.1 mEq/L (ref 3.7–5.3)
Sodium: 136 mEq/L — ABNORMAL LOW (ref 137–147)

## 2014-03-30 LAB — CBC
HEMATOCRIT: 24.4 % — AB (ref 36.0–46.0)
HEMOGLOBIN: 7.7 g/dL — AB (ref 12.0–15.0)
MCH: 26 pg (ref 26.0–34.0)
MCHC: 31.6 g/dL (ref 30.0–36.0)
MCV: 82.4 fL (ref 78.0–100.0)
Platelets: 145 10*3/uL — ABNORMAL LOW (ref 150–400)
RBC: 2.96 MIL/uL — ABNORMAL LOW (ref 3.87–5.11)
RDW: 19.2 % — ABNORMAL HIGH (ref 11.5–15.5)
WBC: 11.2 10*3/uL — AB (ref 4.0–10.5)

## 2014-03-30 LAB — GLUCOSE, CAPILLARY
GLUCOSE-CAPILLARY: 155 mg/dL — AB (ref 70–99)
GLUCOSE-CAPILLARY: 120 mg/dL — AB (ref 70–99)
Glucose-Capillary: 172 mg/dL — ABNORMAL HIGH (ref 70–99)
Glucose-Capillary: 171 mg/dL — ABNORMAL HIGH (ref 70–99)

## 2014-03-30 LAB — PROTIME-INR
INR: 1.21 (ref 0.00–1.49)
Prothrombin Time: 15 seconds (ref 11.6–15.2)

## 2014-03-30 LAB — BONE MARROW EXAM

## 2014-03-30 LAB — BETA 2 MICROGLOBULIN, SERUM: Beta-2 Microglobulin: 16 mg/L — ABNORMAL HIGH (ref <=2.51)

## 2014-03-30 MED ORDER — MIDAZOLAM HCL 2 MG/2ML IJ SOLN
INTRAMUSCULAR | Status: AC
  Filled 2014-03-30: qty 4

## 2014-03-30 MED ORDER — LIDOCAINE HCL 1 % IJ SOLN
INTRAMUSCULAR | Status: AC
Start: 2014-03-30 — End: 2014-03-30
  Filled 2014-03-30: qty 10

## 2014-03-30 MED ORDER — FENTANYL CITRATE 0.05 MG/ML IJ SOLN
INTRAMUSCULAR | Status: AC
  Filled 2014-03-30: qty 4

## 2014-03-30 MED ORDER — FENTANYL CITRATE 0.05 MG/ML IJ SOLN
INTRAMUSCULAR | Status: AC | PRN
Start: 2014-03-30 — End: 2014-03-30
  Administered 2014-03-30: 50 ug via INTRAVENOUS

## 2014-03-30 MED ORDER — LIDOCAINE HCL 1 % IJ SOLN
INTRAMUSCULAR | Status: AC
  Filled 2014-03-30: qty 10

## 2014-03-30 MED ORDER — MIDAZOLAM HCL 2 MG/2ML IJ SOLN
INTRAMUSCULAR | Status: AC | PRN
  Administered 2014-03-30: 0.5 mg via INTRAVENOUS

## 2014-03-30 NOTE — Procedures (Signed)
Interventional Radiology Procedure Note  Procedure: CT guided aspirate and core biopsy of right iliac bone Complications: None Recommendations: - Bedrest supine x 2 hrs - Hydrocodone PRN  Pain - Follow biopsy results  Signed,  Broedy Osbourne K. Morganne Haile, MD [facility]   

## 2014-03-30 NOTE — Sedation Documentation (Signed)
Patient denies pain and is resting comfortably.  

## 2014-03-30 NOTE — Consult Note (Signed)
Physical Medicine and Rehabilitation Consult  Reason for Consult: Deconditioning Referring Physician: Dr. McDiarmid   HPI: [name] is a 68 y.o. female with history of DM, neurofibromatosis, PE-chronic coumadin; who was admitted on 03/24/14 with hemoptysis, hematuria as well as severe back pain.   Patient with bilateral infiltrates with supratherapeutic INR- 6.0 as well as drop in Hgb from 10.2 to 7.4.  CTA negative for PE. She was started on IV antibiotics for HCAP coverage and coumadin reversed with 4 units FFP. She was transfused with 2 units PRBC and Dr. Nelda Marseille recommended discontinuing anti-coagulation until hemoptysis resolved. Lumbar spine X-rays with L1 endplate compression fracture and NM bone scan with mottled appearance of bony structures with question of myeloma. Dr. Jana Hakim consulted for input and recommended bone marrow biopsy.  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 PT evaluation limited due to back pain. MD and PT recommending CIR.    Review of Systems  HENT: Negative for hearing loss.   Eyes: Negative for blurred vision and double vision.  Respiratory: Negative for cough and shortness of breath.   Cardiovascular: Negative for chest pain and palpitations.  Gastrointestinal: Negative for heartburn, abdominal pain and constipation.  Genitourinary: Positive for dysuria. Negative for frequency.  Musculoskeletal: Positive for back pain (severe back pain X 4 weesk. ).  Neurological: Positive for weakness. Negative for dizziness and headaches.    Past Medical History  Diagnosis Date  . Neurofibromatosis   . Diabetes mellitus   . HTN (hypertension)   . Obesity   . Osteoarthritis, knee   . Hyperlipidemia     statin intolerant. LDL is 83  . UTI (lower urinary tract infection) 05/29/12  . 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 Pulmonary embolism [id number]   Past Surgical  History  Procedure Laterality Date  . Cardiac catheterization    . Tonsillectomy and adenoidectomy    . Clavicle surgery    . Abdominal hysterectomy    . Ankle fracture surgery Right   . Total knee arthroplasty Left 09/15/2013    Procedure: TOTAL KNEE ARTHROPLASTY;  Surgeon: Vickey Huger, MD;  Location: [facility];  Service: Orthopedics;  Laterality: Left;    Family History  Problem Relation Age of Onset  . Heart disease Mother   . Arthritis Mother   . Stroke Mother   . Heart disease Son    Social History:  Married. Retired from [employer].  Has been requiring physical assistance for last 4 weeks due to severe back pain. She reports that she has never smoked. She has never used smokeless tobacco. She reports that she does not drink alcohol or use illicit drugs.  Allergies  Allergen Reactions  . Codeine Nausea And Vomiting  . Hydrocodone Nausea And Vomiting  . Niaspan [Niacin Er] Nausea Only  . Robaxin [Methocarbamol] Other (See Comments)    Made patient feel funny  . Statins Nausea And Vomiting and Other (See Comments)    Myalgias/leg pain with atorvastatin, rosuvastatin, lovastatin, simvastatin  . Tetracycline Other (See Comments)    Pt doesn't remember reaction  . Zetia [Ezetimibe] Nausea And Vomiting  . Zithromax [Azithromycin] Nausea And Vomiting    Medications Prior to Admission  Medication Sig Dispense Refill  . aspirin EC 81 MG tablet Take 81 mg by mouth at bedtime.      Marland Kitchen glimepiride (AMARYL) 2 MG tablet Take 4 mg by  mouth 2 (two) times daily.       Marland Kitchen losartan (COZAAR) 25 MG tablet Take 25 mg by mouth daily.      . Menthol-Methyl Salicylate (MUSCLE RUB EX) Apply 1 application topically 3 (three) times daily as needed (back pain).      . metFORMIN (GLUCOPHAGE) 1000 MG tablet Take 1 tablet (1,000 mg total) by mouth 2 (two) times daily.  180 tablet  3  . methocarbamol (ROBAXIN) 500 MG tablet Take 500-1,000 mg by mouth every 4 (four) hours as needed for muscle spasms.         . metoprolol succinate (TOPROL-XL) 25 MG 24 hr tablet Take 25 mg by mouth at bedtime.      . Multiple Vitamin (MULTIVITAMIN WITH MINERALS) TABS tablet Take 1 tablet by mouth at bedtime. Centrum Silver      . Multiple Vitamins-Minerals (OCUVITE PO) Take 1 tablet by mouth daily.       . nitroGLYCERIN (NITROSTAT) 0.4 MG SL tablet Place 1 tablet (0.4 mg total) under the tongue every 5 (five) minutes as needed for chest pain (up to 3 doses).  25 tablet  3  . traMADol (ULTRAM) 50 MG tablet Take 50-100 mg by mouth every 4 (four) hours as needed (pain).       Marland Kitchen warfarin (COUMADIN) 5 MG tablet Take 2.5-5 mg by mouth daily at 6 PM. Take 1/2 tablet (2.5 mg) on Mondays and Fridays; take 1 tablet (5 mg) on Sunday, Tuesday, Wednesday, Thursday, Saturday        Home: Home Living Family/patient expects to be discharged to:: Private residence Living Arrangements: Spouse/significant other Available Help at Discharge: Family;Available 24 hours/day Type of Home: House Home Access: Ramped entrance;Stairs to enter Entrance Stairs-Number of Steps: 2 Entrance Stairs-Rails: Right Home Layout: One level Home Equipment: Walker - 2 wheels;Bedside commode;Cane - single point;Wheelchair - manual;Hospital bed Additional Comments: Pt has been sponge bathing for last 3 weeks due to pain.  Functional History: Prior Function Level of Independence: Needs assistance Gait / Transfers Assistance Needed: using RW could walk household distances; using RW x 2 weeks due to back pain; at times husband pushed in w/c due to pain (mostly in past week) ADL's / Homemaking Assistance Needed: Pt was I with most adls 3 weeks ago but now sponges bathes only and husband assists wtih LE bathing and dressing. Functional Status:  Mobility: Bed Mobility Overal bed mobility: Needs Assistance Bed Mobility: Rolling;Sidelying to Sit;Sit to Sidelying Rolling: Supervision Sidelying to sit: Mod assist;HOB elevated Sit to sidelying: Min  assist General bed mobility comments: vc for technique to minimize back pain; pain limiting effort and required assist Transfers Overall transfer level: Needs assistance Equipment used: Rolling walker (2 wheeled) Transfers: Sit to/from [facility] Sit to Stand: Min assist Stand pivot transfers: Min assist;+2 safety/equipment General transfer comment: Cues for hand placement needed on all transfers. Ambulation/Gait Ambulation/Gait assistance: Min guard Ambulation Distance (Feet): 25 Feet (15, seated/toilet; 10) Assistive device: Rolling walker (2 wheeled) Gait Pattern/deviations: Step-through pattern;Decreased stride length Gait velocity interpretation: <1.8 ft/sec, indicative of risk for recurrent falls General Gait Details: incr bil UE support due to back pain; pt initially asking to use a cane (she typically prefers), however she is stabilizing with bil UEs on RW due to pain    ADL: ADL Overall ADL's : Needs assistance/impaired Eating/Feeding: Independent Grooming: Set up;Sitting Upper Body Bathing: Set up;Sitting Lower Body Bathing: Moderate assistance;Sit to/from stand Lower Body Bathing Details (indicate cue type and reason): Pt  with difficulty reaching lower legs due to pain.  Crossing legs up painful on back as well. Upper Body Dressing : Minimal assistance;Sitting Upper Body Dressing Details (indicate cue type and reason): min assist due to pain. Lower Body Dressing: Moderate assistance;Sit to/from stand Lower Body Dressing Details (indicate cue type and reason): mod assist for socks and shoes and starting pants.  Pt would benefit from being introduced to AE Toilet Transfer: Minimal assistance;+2 for safety/equipment;Ambulation;Comfort height toilet Toilet Transfer Details (indicate cue type and reason): Pt walked to toilet.  Pt cont to need cues to push up from surface she is leaving and reach back for surface she is going to. Toileting- Water quality scientist  and Hygiene: Min guard;Sit to/from stand Functional mobility during ADLs: +2 for safety/equipment;Minimal assistance General ADL Comments: Pt with great amount of assist needed for all LE adls due to pain.  Cognition: Cognition Overall Cognitive Status: Within Functional Limits for tasks assessed Orientation Level: Oriented X4 Cognition Arousal/Alertness: Awake/alert Behavior During Therapy: WFL for tasks assessed/performed Overall Cognitive Status: Within Functional Limits for tasks assessed  Blood pressure 118/70, pulse 97, temperature 97.8 F (36.6 C), temperature source Oral, resp. rate 17, height $RemoveBe'5\' 3"'LRzHpmqze$  (1.6 m), weight 86.183 kg (190 lb), SpO2 93.00%. Physical Exam  Nursing note and vitals reviewed. Constitutional: She is oriented to person, place, and time. She appears well-developed and well-nourished. Nasal cannula in place.  HENT:  Head: Normocephalic and atraumatic.  Eyes: Conjunctivae are normal. Pupils are equal, round, and reactive to light.  Neck: Normal range of motion. Neck supple.  Cardiovascular: Normal rate and regular rhythm.   Respiratory: Effort normal and breath sounds normal. No respiratory distress. She has no wheezes.  GI: Soft. Bowel sounds are normal. She exhibits no distension. There is no tenderness.  Musculoskeletal: She exhibits no edema and no tenderness.  Low back tender to palpation and with proximal LE and pelvic movements  Neurological: She is alert and oriented to person, place, and time.  UE 4/5 prox to distal. LE 3/5 HF 4-ke and 4/5 at ankles. No sensory loss. Reasonable insight and awareness  Skin: Skin is warm and dry.  Psychiatric:  Flat but otherwise appropriate     Results for orders placed during the hospital encounter of 03/24/14 (from the past 24 hour(s))  GLUCOSE, CAPILLARY     Status: Abnormal   Collection Time    03/29/14  4:21 PM      Result Value Ref Range   Glucose-Capillary 158 (*) 70 - 99 mg/dL   Comment 1 Notify RN     GLUCOSE, CAPILLARY     Status: Abnormal   Collection Time    03/29/14  9:06 PM      Result Value Ref Range   Glucose-Capillary 185 (*) 70 - 99 mg/dL  GLUCOSE, CAPILLARY     Status: Abnormal   Collection Time    03/29/14 11:19 PM      Result Value Ref Range   Glucose-Capillary 180 (*) 70 - 99 mg/dL  GLUCOSE, CAPILLARY     Status: Abnormal   Collection Time    03/30/14  5:18 AM      Result Value Ref Range   Glucose-Capillary 155 (*) 70 - 99 mg/dL  PROTIME-INR     Status: None   Collection Time    03/30/14  5:54 AM      Result Value Ref Range   Prothrombin Time 15.0  11.6 - 15.2 seconds   INR 1.21  0.00 - 1.49  CBC     Status: Abnormal   Collection Time    03/30/14  5:54 AM      Result Value Ref Range   WBC 11.2 (*) 4.0 - 10.5 K/uL   RBC 2.96 (*) 3.87 - 5.11 MIL/uL   Hemoglobin 7.7 (*) 12.0 - 15.0 g/dL   HCT 24.4 (*) 36.0 - 46.0 %   MCV 82.4  78.0 - 100.0 fL   MCH 26.0  26.0 - 34.0 pg   MCHC 31.6  30.0 - 36.0 g/dL   RDW 19.2 (*) 11.5 - 15.5 %   Platelets 145 (*) 150 - 400 K/uL  BASIC METABOLIC PANEL     Status: Abnormal   Collection Time    03/30/14  5:54 AM      Result Value Ref Range   Sodium 136 (*) 137 - 147 mEq/L   Potassium 4.1  3.7 - 5.3 mEq/L   Chloride 93 (*) 96 - 112 mEq/L   CO2 26  19 - 32 mEq/L   Glucose, Bld 148 (*) 70 - 99 mg/dL   BUN 26 (*) 6 - 23 mg/dL   Creatinine, Ser 1.19 (*) 0.50 - 1.10 mg/dL   Calcium 8.6  8.4 - 10.5 mg/dL   GFR calc non Af Amer 47 (*) >90 mL/min   GFR calc Af Amer 54 (*) >90 mL/min  BONE MARROW EXAM     Status: None   Collection Time    03/30/14 11:45 AM      Result Value Ref Range   Bone Marrow Exam SEE PATHOLOGY REPORT [id number]    GLUCOSE, CAPILLARY     Status: Abnormal   Collection Time    03/30/14  1:46 PM      Result Value Ref Range   Glucose-Capillary 172 (*) 70 - 99 mg/dL   Comment 1 Notify RN     Ct Biopsy  03/30/2014   CLINICAL DATA:  68 year old female with numerous 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 CT-guided bone marrow biopsy is requested to confirm the diagnosis.  EXAM: CT BIOPSY  Date: 03/30/2014  PROCEDURE: 1. CT guided bone marrow aspiration and core biopsy Interventional Radiologist:  Criselda Peaches, MD  ANESTHESIA/SEDATION: Moderate (conscious) sedation was used. 0.5 mg Versed, 50 mcg Fentanyl were administered intravenously. The patient's vital signs were monitored continuously by radiology nursing throughout the procedure.  Sedation Time: 10 minutes  TECHNIQUE: Informed consent was obtained from the patient following explanation of the procedure, risks, benefits and alternatives. The patient understands, agrees and consents for the procedure. All questions were addressed. A time out was performed.  The patient was positioned prone and noncontrast localization CT was performed of the pelvis to demonstrate the iliac marrow spaces.  Maximal barrier sterile technique utilized including caps, mask, sterile gowns, sterile gloves, large sterile drape, hand hygiene, and betadine prep.  Under sterile conditions and local anesthesia, an 11 gauge coaxial bone biopsy needle was advanced into the left iliac marrow space. Needle position was confirmed with CT imaging. Initially, bone marrow aspiration was performed. Next, the 11 gauge outer cannula was utilized to obtain a left iliac bone marrow core biopsy. Needle was removed. Hemostasis was obtained with compression. The patient tolerated the procedure well. Samples were prepared with the cytotechnologist. No immediate complications.  IMPRESSION: CT guided left iliac bone marrow aspiration and core biopsy.  Signed,  Criselda Peaches, MD  [facility]  [facility]   Electronically Signed   By:  Jacqulynn Cadet M.D.   On: 03/30/2014 13:45    Assessment/Plan: Diagnosis: deconditioning related to multiple medical issues, L1 compression fx 1. Does the need for close, 24 hr/day medical supervision in  concert with the patient's rehab needs make it unreasonable for this patient to be served in a less intensive setting? Yes 2. Co-Morbidities requiring supervision/potential complications: anemia, MM?, hx of PE, pneumonia 3. Due to bladder management, bowel management, safety, skin/wound care, disease management, medication administration, pain management and patient education, does the patient require 24 hr/day rehab nursing? Yes 4. Does the patient require coordinated care of a physician, rehab nurse, PT (1-2 hrs/day, 5 days/week) and OT (1-2 hrs/day, 5 days/week) to address physical and functional deficits in the context of the above medical diagnosis(es)? Yes Addressing deficits in the following areas: balance, endurance, locomotion, strength, transferring, bowel/bladder control, bathing, dressing, feeding, grooming, toileting and psychosocial support 5. Can the patient actively participate in an intensive therapy program of at least 3 hrs of therapy per day at least 5 days per week? Yes 6. The potential for patient to make measurable gains while on inpatient rehab is excellent 7. Anticipated functional outcomes upon discharge from inpatient rehab are modified independent  with PT, modified independent with OT, n/a with SLP. 8. Estimated rehab length of stay to reach the above functional goals is: 7-10 days 9. Does the patient have adequate social supports to accommodate these discharge functional goals? Yes 10. Anticipated D/C setting: Home 11. Anticipated post D/C treatments: [place] therapy 12. Overall Rehab/Functional Prognosis: excellent  RECOMMENDATIONS: This patient's condition is appropriate for continued rehabilitative care in the following setting: CIR Patient has agreed to participate in recommended program. Yes Note that insurance prior authorization may be required for reimbursement for recommended care.  Comment: Rehab Admissions Coordinator to follow up.  Thanks,  Meredith Staggers, MD, Mellody Drown     03/30/2014

## 2014-03-30 NOTE — Progress Notes (Signed)
Physical Therapy Treatment Patient Details Name: [name] MRN: [id number] DOB: 09-12-1946 Today's Date: 03/30/2014    History of Present Illness [name] is a 68 y.o. female presenting with hemoptysis and back pain. PMH is significant for HTN, DM, STEMI, PE on coumadin. INR >6 on adm.  Possible malignancy noted on bone scan. Lumbar biopsy 03/30/14.     PT Comments    Pt willing to try mobility after her bedrest s/p lumbar biopsy. Pre-medicated for pain with IV meds. Pain more severe than last visit and pt required more assist with transitional movements. Discussed discharge options and the patient states she wants to go home, however also wants to be more independent before going home. (see OT note re: ADL status). Pt stated her MD talked about possible surgery (noted previous mention of ?kyphoplasty), which certainly could impact her pain level and thus her mobility/independence. Will continue to follow.    Follow Up Recommendations  CIR;Supervision for mobility/OOB     Equipment Recommendations  None recommended by PT    Recommendations for Other Services       Precautions / Restrictions Precautions Precautions: Fall Required Braces or Orthoses: Other Brace/Splint Other Brace/Splint: husband has not yet brought corset; discussed with him and he will bring    Mobility  Bed Mobility Overal bed mobility: Needs Assistance Bed Mobility: Rolling;Sidelying to Sit;Sit to Sidelying Rolling: Supervision Sidelying to sit: Max assist;HOB elevated     Sit to sidelying: Mod assist General bed mobility comments: pt with incr pain s/p lumbar biopsy and required incr assist to raise trunk to sitting; assist to raise legs to sidelying  Transfers Overall transfer level: Needs assistance Equipment used: Rolling walker (2 wheeled) Transfers: Sit to/from Stand Sit to Stand: Mod assist;+2 physical assistance         General transfer comment: vc for safe use of RW; assist  from bed mod assist +2; from [facility] min assist (easier with elevated BSC and armrests)  Ambulation/Gait Ambulation/Gait assistance: Min assist;+2 physical assistance Ambulation Distance (Feet): 22 Feet Assistive device: Rolling walker (2 wheeled) Gait Pattern/deviations: Step-through pattern;Decreased stride length   Gait velocity interpretation: <1.8 ft/sec, indicative of risk for recurrent falls General Gait Details: very slow with incr bil UE due to pain   Stairs            Wheelchair Mobility    Modified Rankin (Stroke Patients Only)       Balance                                    Cognition Arousal/Alertness: Awake/alert Behavior During Therapy: WFL for tasks assessed/performed Overall Cognitive Status: Within Functional Limits for tasks assessed                      Exercises      General Comments General comments (skin integrity, edema, etc.): Pt asking about possible kyphoplasty. Referred her to discuss with her physicians.      Pertinent Vitals/Pain 5/10 at rest (pre-medicated); more severe with activity, however pt unable to rate; patient repositioned for comfort     Home Living                      Prior Function            PT Goals (current goals can now be found in the care plan section) Acute Rehab PT  Goals Patient Stated Goal: pt wants to go home, and wants to do as much for herself as she can Progress towards PT goals: Not progressing toward goals - comment (incr pain today; s/p lumbar biopsy)    Frequency  Min 3X/week    PT Plan Current plan remains appropriate    Co-evaluation PT/OT/SLP Co-Evaluation/Treatment: Yes Reason for Co-Treatment: Complexity of the patient's impairments (multi-system involvement);For patient/therapist safety PT goals addressed during session: Mobility/safety with mobility;Proper use of DME       End of Session Equipment Utilized During Treatment: Gait belt;Oxygen Activity  Tolerance: Patient limited by pain Patient left: in bed;with call bell/phone within reach;with family/visitor present     Time: 7824-2353 PT Time Calculation (min): 31 min  Charges:  $Gait Training: 8-22 mins                    G Codes:      Jeanie Cooks Nainika Newlun 04/16/2014, 4:45 PM Pager [phone]

## 2014-03-30 NOTE — Progress Notes (Signed)
PT Cancellation Note  Patient Details Name: [name] MRN: [id number] DOB: 24-Dec-1945   Cancelled Treatment:    Reason Treat Not Completed: Patient preparing to leave for procedure (lumbar biopsy). Will see this pm if activity allowed.  [name] 03/30/2014, 10:35 AM Pager [phone]

## 2014-03-30 NOTE — Progress Notes (Signed)
Family Medicine Teaching Service Daily Progress Note Intern Pager: [phone]  Patient name: [name] Medical record number: [id number] Date of birth: 02-14-1946 Age: 68 y.o. Gender: female  Primary Care Provider: Jenny Reichmann, MD Consultants: PCCM (signed off) Code Status: full  Pt Overview and Major Events to Date:  - Hemoptysis thought old blood. PCCM signed off. - CTA with multifocal infiltrates - treating with levaquin - Bone irregularities on CTA - raising concern for MM. Bx planned for 4/13 - 4/10 developed fluid overload after IV fluids for hypercalcemia. Lasix x 1. - 4/13 plan for BM biopsy  Assessment and Plan: RAPHAEL ESPE is a 68 y.o. female presenting with hemoptysis and back pain of recent onset, causing significant physical impairment. PMH is significant for HTN, DM, STEMI, provoked PE on coumadin.  # Pulm: Hemoptysis, Bilateral infiltrates, O2 requirement thought CAP. Patient with recent onset hemoptysis in setting of bilateral infiltrates seen on CTA and supratherapeutic INR to 6 on coumadin. S/p vitK and 4uFFP and 2uPRBCs. Pt hemoglobin improved after second unit PRBCs to 8.3-> 8.0->7.1->7.4. INR 1.41->1.47->1.59->1.43->1.21 -PCCM signed off, will re-consult if develops frank bloody hemoptysis -Will monitor today for volume overload - consider redosing lasix if has increased WOB -if hyperthrombotic 2/2 malignancy will need to reconsider anti-coag on d/c.  -continue levaquin PO (4/9-)   # CV: HTN/Tachycardia - likely related to patients anemia and intravascular volume depletion as well as infection vs thyroid (TSH nml) vs CHF; EKG sinus tachy, given metoprolol 5IV and 12.5mg PO 4/9 evening. Continuing to improve  -started metoprolol 25 BID 4/10 AM -monitor for hypotension; currently BP normal  # Renal: AKI (1.65-> 1.45-->1.18-->1.14->1.19)/hypercalcemia(improving 15-> 8.6)- extremely concerning for pathologic etiology -s/p NS and zometa 4 mg for  hypercalcemia, continue calcitonin for hypercalcemia and pain -continue to monitor calcium - if continues to drop will d/c calcitonin -will trend Cr  -monitor fluid status  # Heme: Supratherapeutic INR improved, Anemia with component iron deficiency. Stable, possibly dilutional from a few days of IV hydration, Leukocytosis improved; Likely malignant process, concern for MM given mottled appearance of bones on films. Sed rate and CRP elevated. M spike detected. Plan for BM biospy on Monday 4/13 -heme/onc consulted, appreciate recommendations -cbcs daily -trend INR  -s/p feraheme; then oral iron -s/p additional vit K as INR was trending up yesterday  # ID: Bilateral infiltrates, treating for CAP. Leukocytosis. -continue levaquin  -monitor fever curve  -f/up BCx NGx4daysand UCx neg (obtained after initiation)  # MSK: Vertebral lumbar spine fracture - likely cause of her recent back pain, normal neurologic exam.  -continue oxycodone 7.5 mg for pain (3prns) -SPEP, UPEP, IFE - indicate MM -monitor for cord sx/compression  # FENGI: Hypoalbuminemic; constipated -NPO for procedure -senna-docusate/miralax, hold if diarrhea worse  Prophylaxis: SCDs, no chemical prophylaxis given recent bleed  Disposition: BM biopsy 4/13; f/up recommendations; est with hemeonc f/up  Subjective: Anxious about biopsy today; does not want any anti-depressive medication at this time, stooling without difficulty  Objective: Temp:  [97.3 F (36.3 C)-98.2 F (36.8 C)] 98.2 F (36.8 C) (04/13 0403) Pulse Rate:  [87-94] 88 (04/13 0403) Resp:  [19-20] 20 (04/13 0403) BP: (135-149)/(72-81) 135/72 mmHg (04/13 0403) SpO2:  [95 %-98 %] 98 % (04/13 0403) Physical Exam: General: NAD, resting in bed with husband at bedside, tired-appearing HEENT: NCAT, [place] in place (turned down to 3L while in the room) Cardiovascular: rrr, no murmurs appreciated Respiratory: normal WOB, crackles bilaterally in mid-lower fields,  R>L Abdomen: soft, NT, ND,  no guarding or rebound, obese Extremities: warm, well perfused, no edema  Psych: flat affect this morning; appears clinically depressed   Laboratory:  Recent Labs Lab 03/27/14 0350 03/28/14 0352 03/29/14 0530  WBC 8.5 6.3 10.5  HGB 8.0* 7.1* 7.4*  HCT 24.8* 22.6* 23.7*  PLT 250 169 159    Recent Labs Lab 03/24/14 1905  03/27/14 1420 03/28/14 0352 03/29/14 0530  NA 134*  < > 137 137 133*  K 4.8  < > 4.2 4.0 4.6  CL 92*  < > 96 96 93*  CO2 23  < > 24 24 22   BUN 28*  < > 24* 21 26*  CREATININE 1.14*  < > 1.30* 1.18* 1.14*  CALCIUM 12.3*  < > 10.2 10.0 8.7  PROT 11.2*  --   --   --   --   BILITOT 0.4  --   --   --   --   ALKPHOS 70  --   --   --   --   ALT 9  --   --   --   --   AST 36  --   --   --   --   GLUCOSE 111*  < > 175* 95 153*  < > = values in this interval not displayed.   Recent Labs Lab 03/25/14 0850 03/26/14 0309 03/27/14 0350 03/28/14 0352 03/29/14 0530  INR 1.39 1.41 1.47 1.59* 1.43    UA large blood on adm  Imaging/Diagnostic Tests: CT angio IMPRESSION: 1. No evidence of pulmonary embolus. 2. Relatively diffuse multifocal pneumonia noted bilaterally. 3. Diffuse coronary artery calcifications seen. 4. Diffusely heterogeneous appearance to the attenuation of visualized osseous structures, somewhat more prominent than on the prior study and new from 2010. Would correlate for evidence of underlying systemic condition, or possibly metastatic disease. Bone scan or correlation with lab findings could be considered for further evaluation, as deemed clinically appropriate.  Bone scan IMPRESSION:  1. Findings consistent with old right posterior fifth and left  posterior ninth rib fractures. No evidence of metastatic disease.  2. Given the mottled appearance of the bony structures on recent  chest CT, to evaluate for myeloma serum protein electrophoresis  should be considered  EKG 4/11 - Sinus tach, frequent PVC, otherwise  stable from prior (q waves II and III)  Langston Masker, MD 03/30/2014, 6:50 AM PGY-1, [place] Intern pager: [phone], text pages welcome

## 2014-03-30 NOTE — Progress Notes (Signed)
Occupational Therapy Treatment Patient Details Name: [name] MRN: [id number] DOB: 04/16/1946 Today's Date: 03/30/2014    History of present illness [name] is a 68 y.o. female presenting with hemoptysis and back pain. PMH is significant for HTN, DM, STEMI, PE on coumadin. INR >6 on adm.  Possible malignancy noted on bone scan. Lumbar biopsy 03/30/14.    OT comments  Patient making progress towards goals. Feel pt would benefit from acute rehab prior to discharge home. Discussed this with pt and she was in agreement. Continue OT POC.  Follow Up Recommendations  CIR;Supervision/Assistance - 24 hour    Equipment Recommendations  Tub/shower seat    Recommendations for Other Services Rehab consult    Precautions / Restrictions Precautions Precautions: Fall Required Braces or Orthoses: Other Brace/Splint Other Brace/Splint: husband has not yet brought corset; discussed with him and he will bring       Mobility Bed Mobility Overal bed mobility: Needs Assistance Bed Mobility: Rolling;Sidelying to Sit;Sit to Sidelying Rolling: Supervision Sidelying to sit: Max assist;HOB elevated     Sit to sidelying: Mod assist General bed mobility comments: pt with incr pain s/p lumbar biopsy and required incr assist to raise trunk to sitting; assist to raise legs to sidelying  Transfers Overall transfer level: Needs assistance Equipment used: Rolling walker (2 wheeled) Transfers: Sit to/from Stand Sit to Stand: Mod assist;+2 physical assistance Stand pivot transfers: Min assist;+2 safety/equipment       General transfer comment: vc for safe use of RW; assist from bed mod assist +2; from [facility] min assist (easier with elevated BSC and armrests)     ADL Overall ADL's : Needs assistance/impaired Eating/Feeding: Independent   Grooming: Set up;Sitting           Upper Body Dressing : Minimal assistance;Sitting Upper Body Dressing Details (indicate cue type and reason):  min assist due to pain. Lower Body Dressing: Total assistance (doff socks only)   Toilet Transfer: Minimal assistance;+2 for safety/equipment;Ambulation;Comfort height toilet Toilet Transfer Details (indicate cue type and reason): Pt walked in room, then to [facility]. Pt cont to need cues to push up from surface she is leaving and reach back for surface she is going to. Toileting- Clothing Manipulation and Hygiene: Total assistance (due to pain)         General ADL Comments: Pt with great amount of assist needed for all LE adls due to pain.       Cognition   Behavior During Therapy: WFL for tasks assessed/performed Overall Cognitive Status: Within Functional Limits for tasks assessed                       Pertinent Vitals/ Pain       C/o pain prior to session. Nursing premedicated with pain medicine prior to treatment. Pain level 4/10, increased with mobility.   Frequency Min 3X/week     Progress Toward Goals  OT Goals(current goals can now be found in the care plan section)  Progress towards OT goals: Progressing toward goals  Acute Rehab OT Goals Patient Stated Goal: pt wants to go home, and wants to do as much for herself as she can  Plan Discharge plan remains appropriate    Co-evaluation    PT/OT/SLP Co-Evaluation/Treatment: Yes Reason for Co-Treatment: Complexity of the patient's impairments (multi-system involvement);For patient/therapist safety PT goals addressed during session: Mobility/safety with mobility OT goals addressed during session: ADL's and self-care      End of Session Equipment Utilized  During Treatment: Gait belt;Rolling walker;Oxygen   Activity Tolerance Patient tolerated treatment well;Patient limited by pain   Patient Left with call bell/phone within reach;in bed;with family/visitor present   Nurse Communication Patient requests pain meds (prior to session)        Time: 2035-5974 OT Time Calculation (min): 32 min  Charges: OT  General Charges $OT Visit: 1 Procedure OT Treatments $Self Care/Home Management : 8-22 mins  Miachel Nardelli A Shirleyann Montero 03/30/2014, 4:51 PM

## 2014-03-30 NOTE — Progress Notes (Signed)
Chaplain offered pt and pt's son compassion through his presence, conversation and listening.  Chaplain also prayed with pt.   03/30/14 1600  Clinical Encounter Type  Visited With Patient and family together  Visit Type Spiritual support  Referral From Physician   Estelle June, chaplain [phone]

## 2014-03-30 NOTE — Progress Notes (Signed)
FMTS Attending Daily Note:  Annabell Sabal MD  [phone] pager  Family Practice pager:  [phone] I have seen and examined this patient and have reviewed their chart. I have discussed this patient with the resident. I agree with the resident's findings, assessment and care plan.  Additionally:  Seen s/p bone marrow bx.  Proportional pain from site as expected.  Back pain better than admit.  Family would like to discuss home vs CIR.  CIR to see.  Family leaning to CIR, pt leaning towards home.    Alveda Reasons, MD 03/30/2014 2:43 PM

## 2014-03-31 ENCOUNTER — Inpatient Hospital Stay (HOSPITAL_COMMUNITY)
Admission: RE | Admit: 2014-03-31 | Discharge: 2014-04-07 | DRG: 945 | Disposition: A | Discharge status: Home Health | Payer: Medicare Other | Source: Intra-hospital | Attending: Physical Medicine & Rehabilitation | Admitting: Physical Medicine & Rehabilitation

## 2014-03-31 DIAGNOSIS — Z7901 Long term (current) use of anticoagulants: Secondary | ICD-10-CM

## 2014-03-31 DIAGNOSIS — Z96659 Presence of unspecified artificial knee joint: Secondary | ICD-10-CM

## 2014-03-31 DIAGNOSIS — R110 Nausea: Secondary | ICD-10-CM | POA: Diagnosis present

## 2014-03-31 DIAGNOSIS — Z79899 Other long term (current) drug therapy: Secondary | ICD-10-CM

## 2014-03-31 DIAGNOSIS — M1710 Unilateral primary osteoarthritis, unspecified knee: Secondary | ICD-10-CM | POA: Diagnosis present

## 2014-03-31 DIAGNOSIS — R5381 Other malaise: Secondary | ICD-10-CM

## 2014-03-31 DIAGNOSIS — R042 Hemoptysis: Secondary | ICD-10-CM | POA: Diagnosis present

## 2014-03-31 DIAGNOSIS — S32009A Unspecified fracture of unspecified lumbar vertebra, initial encounter for closed fracture: Secondary | ICD-10-CM | POA: Diagnosis present

## 2014-03-31 DIAGNOSIS — N179 Acute kidney failure, unspecified: Secondary | ICD-10-CM | POA: Diagnosis present

## 2014-03-31 DIAGNOSIS — C9002 Multiple myeloma in relapse: Secondary | ICD-10-CM | POA: Diagnosis present

## 2014-03-31 DIAGNOSIS — N183 Chronic kidney disease, stage 3 unspecified: Secondary | ICD-10-CM | POA: Diagnosis present

## 2014-03-31 DIAGNOSIS — I10 Essential (primary) hypertension: Secondary | ICD-10-CM | POA: Diagnosis present

## 2014-03-31 DIAGNOSIS — Z5189 Encounter for other specified aftercare: Principal | ICD-10-CM

## 2014-03-31 DIAGNOSIS — E669 Obesity, unspecified: Secondary | ICD-10-CM | POA: Diagnosis present

## 2014-03-31 DIAGNOSIS — Z7982 Long term (current) use of aspirin: Secondary | ICD-10-CM

## 2014-03-31 DIAGNOSIS — E785 Hyperlipidemia, unspecified: Secondary | ICD-10-CM | POA: Diagnosis present

## 2014-03-31 DIAGNOSIS — R443 Hallucinations, unspecified: Secondary | ICD-10-CM | POA: Diagnosis present

## 2014-03-31 DIAGNOSIS — I2782 Chronic pulmonary embolism: Secondary | ICD-10-CM | POA: Diagnosis present

## 2014-03-31 DIAGNOSIS — M8448XA Pathological fracture, other site, initial encounter for fracture: Secondary | ICD-10-CM

## 2014-03-31 DIAGNOSIS — T40605A Adverse effect of unspecified narcotics, initial encounter: Secondary | ICD-10-CM | POA: Diagnosis present

## 2014-03-31 DIAGNOSIS — C9000 Multiple myeloma not having achieved remission: Secondary | ICD-10-CM | POA: Diagnosis present

## 2014-03-31 DIAGNOSIS — I252 Old myocardial infarction: Secondary | ICD-10-CM

## 2014-03-31 DIAGNOSIS — E1169 Type 2 diabetes mellitus with other specified complication: Secondary | ICD-10-CM | POA: Diagnosis present

## 2014-03-31 DIAGNOSIS — E1122 Type 2 diabetes mellitus with diabetic chronic kidney disease: Secondary | ICD-10-CM | POA: Diagnosis present

## 2014-03-31 LAB — GLUCOSE, CAPILLARY
GLUCOSE-CAPILLARY: 152 mg/dL — AB (ref 70–99)
Glucose-Capillary: 141 mg/dL — ABNORMAL HIGH (ref 70–99)
Glucose-Capillary: 128 mg/dL — ABNORMAL HIGH (ref 70–99)
Glucose-Capillary: 171 mg/dL — ABNORMAL HIGH (ref 70–99)
Glucose-Capillary: 112 mg/dL — ABNORMAL HIGH (ref 70–99)

## 2014-03-31 LAB — CBC
HCT: 22.5 % — ABNORMAL LOW (ref 36.0–46.0)
Hemoglobin: 7.2 g/dL — ABNORMAL LOW (ref 12.0–15.0)
MCH: 26.7 pg (ref 26.0–34.0)
MCHC: 32 g/dL (ref 30.0–36.0)
MCV: 83.3 fL (ref 78.0–100.0)
Platelets: 145 10*3/uL — ABNORMAL LOW (ref 150–400)
RBC: 2.7 MIL/uL — ABNORMAL LOW (ref 3.87–5.11)
RDW: 19.7 % — AB (ref 11.5–15.5)
WBC: 11.7 10*3/uL — AB (ref 4.0–10.5)

## 2014-03-31 LAB — PROTIME-INR
INR: 1.39 (ref 0.00–1.49)
Prothrombin Time: 16.7 seconds — ABNORMAL HIGH (ref 11.6–15.2)

## 2014-03-31 LAB — CULTURE, BLOOD (ROUTINE X 2)
Culture: NO GROWTH
Culture: NO GROWTH

## 2014-03-31 LAB — BASIC METABOLIC PANEL
BUN: 27 mg/dL — ABNORMAL HIGH (ref 6–23)
CHLORIDE: 96 meq/L (ref 96–112)
CO2: 25 mEq/L (ref 19–32)
CREATININE: 1.28 mg/dL — AB (ref 0.50–1.10)
Calcium: 8.1 mg/dL — ABNORMAL LOW (ref 8.4–10.5)
GFR calc non Af Amer: 43 mL/min — ABNORMAL LOW (ref >90)
GFR, EST AFRICAN AMERICAN: 49 mL/min — AB (ref >90)
Glucose, Bld: 136 mg/dL — ABNORMAL HIGH (ref 70–99)
POTASSIUM: 4.2 meq/L (ref 3.7–5.3)
Sodium: 137 mEq/L (ref 137–147)

## 2014-03-31 MED ORDER — METHOCARBAMOL 500 MG PO TABS
500.0000 mg | ORAL_TABLET | Freq: Four times a day (QID) | ORAL | Status: DC | PRN
Start: 2014-03-31 — End: 2014-04-07
  Administered 2014-04-05: 500 mg via ORAL
  Filled 2014-03-31: qty 1

## 2014-03-31 MED ORDER — GLIMEPIRIDE 2 MG PO TABS
2.0000 mg | ORAL_TABLET | Freq: Two times a day (BID) | ORAL | Status: DC
  Administered 2014-03-31 – 2014-04-01 (×2): 2 mg via ORAL
  Filled 2014-03-31 (×4): qty 1

## 2014-03-31 MED ORDER — ALUM & MAG HYDROXIDE-SIMETH 200-200-20 MG/5ML PO SUSP: 30.0000 mL | ORAL | Status: DC | PRN

## 2014-03-31 MED ORDER — TRAMADOL HCL 50 MG PO TABS
50.0000 mg | ORAL_TABLET | Freq: Four times a day (QID) | ORAL | Status: DC | PRN
  Administered 2014-03-31 – 2014-04-05 (×5): 50 mg via ORAL
  Filled 2014-03-31 (×7): qty 1

## 2014-03-31 MED ORDER — INSULIN ASPART 100 UNIT/ML ~~LOC~~ SOLN
0.0000 [IU] | Freq: Three times a day (TID) | SUBCUTANEOUS | Status: DC
  Administered 2014-03-31 – 2014-04-01 (×3): 3 [IU] via SUBCUTANEOUS
  Administered 2014-04-01 – 2014-04-02 (×3): 2 [IU] via SUBCUTANEOUS
  Administered 2014-04-02: 3 [IU] via SUBCUTANEOUS
  Administered 2014-04-03 (×2): 2 [IU] via SUBCUTANEOUS
  Administered 2014-04-03: 5 [IU] via SUBCUTANEOUS
  Administered 2014-04-04 – 2014-04-05 (×3): 2 [IU] via SUBCUTANEOUS
  Administered 2014-04-05 – 2014-04-06 (×2): 3 [IU] via SUBCUTANEOUS
  Administered 2014-04-07: 2 [IU] via SUBCUTANEOUS

## 2014-03-31 MED ORDER — OXYCODONE HCL 15 MG PO TABS: 7.5000 mg | ORAL_TABLET | ORAL | Status: DC | PRN

## 2014-03-31 MED ORDER — LEVOFLOXACIN 750 MG PO TABS
750.0000 mg | ORAL_TABLET | ORAL | Status: DC
  Administered 2014-04-01 – 2014-04-05 (×3): 750 mg via ORAL
  Filled 2014-03-31 (×4): qty 1

## 2014-03-31 MED ORDER — GUAIFENESIN-DM 100-10 MG/5ML PO SYRP: 5.0000 mL | ORAL_SOLUTION | Freq: Four times a day (QID) | ORAL | Status: DC | PRN

## 2014-03-31 MED ORDER — TROLAMINE SALICYLATE 10 % EX CREA
TOPICAL_CREAM | Freq: Four times a day (QID) | CUTANEOUS | Status: DC | PRN
  Filled 2014-03-31: qty 85

## 2014-03-31 MED ORDER — ACETAMINOPHEN 325 MG PO TABS
650.0000 mg | ORAL_TABLET | Freq: Four times a day (QID) | ORAL | Status: DC | PRN
  Administered 2014-03-31 (×3): 650 mg via ORAL
  Filled 2014-03-31 (×3): qty 2

## 2014-03-31 MED ORDER — DM-GUAIFENESIN ER 30-600 MG PO TB12
1.0000 | ORAL_TABLET | Freq: Two times a day (BID) | ORAL | Status: DC
  Administered 2014-03-31 – 2014-04-07 (×14): 1 via ORAL
  Filled 2014-03-31 (×19): qty 1

## 2014-03-31 MED ORDER — BISACODYL 10 MG RE SUPP: 10.0000 mg | Freq: Every day | RECTAL | Status: DC | PRN

## 2014-03-31 MED ORDER — PROCHLORPERAZINE EDISYLATE 5 MG/ML IJ SOLN
5.0000 mg | Freq: Four times a day (QID) | INTRAMUSCULAR | Status: DC | PRN
  Filled 2014-03-31: qty 2

## 2014-03-31 MED ORDER — ACETAMINOPHEN 325 MG PO TABS
325.0000 mg | ORAL_TABLET | ORAL | Status: DC | PRN
  Administered 2014-04-01 – 2014-04-02 (×4): 650 mg via ORAL
  Administered 2014-04-02: 325 mg via ORAL
  Administered 2014-04-02 – 2014-04-06 (×2): 650 mg via ORAL
  Filled 2014-03-31 (×10): qty 2

## 2014-03-31 MED ORDER — ACETAMINOPHEN 325 MG PO TABS
650.0000 mg | ORAL_TABLET | Freq: Four times a day (QID) | ORAL | Status: DC | PRN
  Administered 2014-04-04 – 2014-04-07 (×3): 650 mg via ORAL

## 2014-03-31 MED ORDER — FLEET ENEMA 7-19 GM/118ML RE ENEM: 1.0000 | ENEMA | Freq: Once | RECTAL | Status: AC | PRN

## 2014-03-31 MED ORDER — LIDOCAINE 5 % EX PTCH
1.0000 | MEDICATED_PATCH | CUTANEOUS | Status: DC
  Administered 2014-04-01 – 2014-04-07 (×7): 1 via TRANSDERMAL
  Filled 2014-03-31 (×8): qty 1

## 2014-03-31 MED ORDER — PROCHLORPERAZINE MALEATE 5 MG PO TABS
5.0000 mg | ORAL_TABLET | Freq: Four times a day (QID) | ORAL | Status: DC | PRN
  Administered 2014-04-01: 5 mg via ORAL
  Filled 2014-03-31: qty 2

## 2014-03-31 MED ORDER — METOPROLOL TARTRATE 25 MG PO TABS
25.0000 mg | ORAL_TABLET | Freq: Two times a day (BID) | ORAL | Status: DC
  Administered 2014-03-31 – 2014-04-07 (×14): 25 mg via ORAL
  Filled 2014-03-31 (×16): qty 1

## 2014-03-31 MED ORDER — INSULIN ASPART 100 UNIT/ML ~~LOC~~ SOLN: 0.0000 [IU] | Freq: Every day | SUBCUTANEOUS | Status: DC

## 2014-03-31 MED ORDER — POLYETHYLENE GLYCOL 3350 17 G PO PACK: 17.0000 g | PACK | Freq: Every day | ORAL | Status: DC

## 2014-03-31 MED ORDER — PROCHLORPERAZINE 25 MG RE SUPP
12.5000 mg | Freq: Four times a day (QID) | RECTAL | Status: DC | PRN
  Filled 2014-03-31: qty 1

## 2014-03-31 MED ORDER — SENNOSIDES-DOCUSATE SODIUM 8.6-50 MG PO TABS
2.0000 | ORAL_TABLET | Freq: Every day | ORAL | Status: DC
  Administered 2014-04-01 – 2014-04-06 (×2): 2 via ORAL
  Filled 2014-03-31 (×8): qty 2

## 2014-03-31 MED ORDER — TRAZODONE HCL 50 MG PO TABS
25.0000 mg | ORAL_TABLET | Freq: Every evening | ORAL | Status: DC | PRN
  Administered 2014-04-02 – 2014-04-06 (×5): 50 mg via ORAL
  Filled 2014-03-31 (×5): qty 1

## 2014-03-31 MED ORDER — DIPHENHYDRAMINE HCL 12.5 MG/5ML PO ELIX: 12.5000 mg | ORAL_SOLUTION | Freq: Four times a day (QID) | ORAL | Status: DC | PRN

## 2014-03-31 NOTE — Progress Notes (Signed)
PT Cancellation Note  Patient Details Name: [name] MRN: [id number] DOB: 07-26-1946   Cancelled Treatment:    Reason Eval/Treat Not Completed: Fatigue limiting ability to participate. Pt reports she just got back in bed after being up since breakfast (several hours). Asked PT to return later today (when her corset should be here--husband has gone to get it).   Jeanie Cooks Vernella Niznik 03/31/2014, 11:39 AM Pager [phone]

## 2014-03-31 NOTE — Interval H&P Note (Signed)
[name] was admitted today to Inpatient Rehabilitation with the diagnosis of deconditioning and L1 compression fx.  The patient's history has been reviewed, patient examined, and there is no change in status.  Patient continues to be appropriate for intensive inpatient rehabilitation.  I have reviewed the patient's chart and labs.  Questions were answered to the patient's satisfaction.  Meredith Staggers 03/31/2014, 4:07 PM

## 2014-03-31 NOTE — Discharge Instructions (Signed)
[name], you were seen in the hospital for back pain and coughing up blood. We gave you medications to help to stabilize your hemoglobin and INR. We were pleased see that things got better and you stopped coughing up blood.   We also started you on antibiotics for possible pneumonia that we saw on the CT of your chest. You completed a course without difficulty  We also sent studies given your back pain and discomfort that were suggestive of possible multiple myeloma. We got a bone marrow biopsy for a more definitive diagnosis. Please follow up with the oncologist for further management and discussion of the results of these studies.

## 2014-03-31 NOTE — H&P (Signed)
Physical Medicine and Rehabilitation Admission H&P    Chief Complaint  Patient presents with  . Deconditioning    HPI: [name] is a 68 y.o. female with history of DM, neurofibromatosis, PE-chronic coumadin; who was admitted on 03/24/14 with hemoptysis, hematuria as well as severe back pain. Patient with bilateral infiltrates with supratherapeutic INR- 6.0 as well as drop in Hgb from 10.2 to 7.4. CTA negative for PE. She was started on IV antibiotics for HCAP coverage and coumadin reversed with 4 units FFP. She was transfused with 2 units PRBC and Dr. Nelda Marseille recommended discontinuing anti-coagulation until hemoptysis resolves. Lumbar spine X-rays with L1 endplate compression fracture and NM bone scan with mottled appearance of bony structures with question of myeloma. Hypercalcemia treated with Zometa as well as calcitonin. Dr. Jana Hakim consulted for input and recommended bone marrow biopsy. 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 PT evaluation limited due to back pain and corset ordered for support?. Rehab team recommending CIR and patient admitted today.    Review of Systems  HENT: Negative for hearing loss.   Eyes: Negative for blurred vision and double vision.  Respiratory: Positive for hemoptysis (not as dark per patient). Negative for cough and wheezing.   Gastrointestinal: Negative for abdominal pain and constipation.  Genitourinary: Positive for frequency. Negative for dysuria.  Musculoskeletal: Positive for back pain and myalgias.  Neurological: Negative for dizziness, sensory change, focal weakness and headaches.  Psychiatric/Behavioral: Positive for hallucinations (last night). Negative for depression. The patient is nervous/anxious. The patient does not have insomnia.      Past Medical History  Diagnosis Date  . Neurofibromatosis   . Diabetes mellitus   . HTN (hypertension)   . Obesity   . Osteoarthritis, knee   . Hyperlipidemia    statin intolerant. LDL is 83  . UTI (lower urinary tract infection) 05/29/12  . 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 Pulmonary embolism [id number]    Past Surgical History  Procedure Laterality Date  . Cardiac catheterization    . Tonsillectomy and adenoidectomy    . Clavicle surgery    . Abdominal hysterectomy    . Ankle fracture surgery Right   . Total knee arthroplasty Left 09/15/2013    Procedure: TOTAL KNEE ARTHROPLASTY;  Surgeon: Vickey Huger, MD;  Location: [place] [facility];  Service: Orthopedics;  Laterality: Left;   Family History  Problem Relation Age of Onset  . Heart disease Mother   . Arthritis Mother   . Stroke Mother   . Heart disease Son     Social History: Married. Retired from [employer]. Has been requiring physical assistance for last 4 weeks due to severe back pain. She reports that she has never smoked. She has never used smokeless tobacco. She reports that she does not drink alcohol or use illicit drugs.     Allergies  Allergen Reactions  . Codeine Nausea And Vomiting  . Hydrocodone Nausea And Vomiting  . Niaspan [Niacin Er] Nausea Only  . Robaxin [Methocarbamol] Other (See Comments)    Made patient feel funny  . Statins Nausea And Vomiting and Other (See Comments)    Myalgias/leg pain with atorvastatin, rosuvastatin, lovastatin, simvastatin  . Tetracycline Other (See Comments)    Pt doesn't remember reaction  . Zetia [Ezetimibe] Nausea And Vomiting  . Zithromax [Azithromycin] Nausea And Vomiting    Medications Prior to Admission  Medication Sig Dispense  Refill  . aspirin EC 81 MG tablet Take 81 mg by mouth at bedtime.      Marland Kitchen glimepiride (AMARYL) 2 MG tablet Take 4 mg by mouth 2 (two) times daily.       Marland Kitchen losartan (COZAAR) 25 MG tablet Take 25 mg by mouth daily.      . Menthol-Methyl Salicylate (MUSCLE RUB EX) Apply 1 application topically 3 (three) times daily as needed (back pain).      .  metFORMIN (GLUCOPHAGE) 1000 MG tablet Take 1 tablet (1,000 mg total) by mouth 2 (two) times daily.  180 tablet  3  . methocarbamol (ROBAXIN) 500 MG tablet Take 500-1,000 mg by mouth every 4 (four) hours as needed for muscle spasms.       . metoprolol succinate (TOPROL-XL) 25 MG 24 hr tablet Take 25 mg by mouth at bedtime.      . Multiple Vitamin (MULTIVITAMIN WITH MINERALS) TABS tablet Take 1 tablet by mouth at bedtime. Centrum Silver      . Multiple Vitamins-Minerals (OCUVITE PO) Take 1 tablet by mouth daily.       . nitroGLYCERIN (NITROSTAT) 0.4 MG SL tablet Place 1 tablet (0.4 mg total) under the tongue every 5 (five) minutes as needed for chest pain (up to 3 doses).  25 tablet  3  . traMADol (ULTRAM) 50 MG tablet Take 50-100 mg by mouth every 4 (four) hours as needed (pain).       . [DISCONTINUED] warfarin (COUMADIN) 5 MG tablet Take 2.5-5 mg by mouth daily at 6 PM. Take 1/2 tablet (2.5 mg) on Mondays and Fridays; take 1 tablet (5 mg) on Sunday, Tuesday, Wednesday, Thursday, Saturday        Home: Home Living Family/patient expects to be discharged to:: Private residence Living Arrangements: Spouse/significant other Available Help at Discharge: Family;Available 24 hours/day Type of Home: House Home Access: Ramped entrance;Stairs to enter Entrance Stairs-Number of Steps: 2 Entrance Stairs-Rails: Right Home Layout: One level Home Equipment: Walker - 2 wheels;Bedside commode;Cane - single point;Wheelchair - manual;Hospital bed Additional Comments: Pt has been sponge bathing for last 3 weeks due to pain.   Functional History:    Functional Status:  Mobility: min assist for basic transfers and mobility     Ambulation/Gait Ambulation Distance (Feet): 22 Feet General Gait Details: very slow with incr bil UE due to pain    ADL: ADL Lower Body Bathing Details (indicate cue type and reason): Pt with difficulty reaching lower legs due to pain.  Crossing legs up painful on back as  well. Upper Body Dressing Details (indicate cue type and reason): min assist due to pain. Lower Body Dressing Details (indicate cue type and reason): mod assist for socks and shoes and starting pants.  Pt would benefit from being introduced to AE Toilet Transfer Details (indicate cue type and reason): Pt walked in room, then to [facility]. Pt cont to need cues to push up from surface she is leaving and reach back for surface she is going to.  Cognition: Cognition Overall Cognitive Status: Within Functional Limits for tasks assessed Orientation Level: Oriented X4 Cognition Arousal/Alertness: Awake/alert Behavior During Therapy: WFL for tasks assessed/performed Overall Cognitive Status: Within Functional Limits for tasks assessed  Physical Exam: Blood pressure 117/60, pulse 91, temperature 98.2 F (36.8 C), temperature source Oral, resp. rate 20, height 5\' 3"  (1.6 m), weight 82.146 kg (181 lb 1.6 oz), SpO2 97.00%. Physical Exam Nursing note and vitals reviewed.  Constitutional: She is oriented to person, place, and  time. She appears well-developed and well-nourished. Nasal cannula in place.  No distress HENT: oral mucosa pink and moist Head: Normocephalic and atraumatic.  Eyes: Conjunctivae are normal. Pupils are equal, round, and reactive to light.  Neck: Normal range of motion. Neck supple.  Cardiovascular: Normal rate and regular rhythm. No murmurs, rubs, or gallops Respiratory: Effort normal and breath sounds normal. No respiratory distress. She has no wheezes.  GI: Soft. Bowel sounds are normal. She exhibits no distension. There is no tenderness.  Musculoskeletal: She exhibits no edema and no tenderness.  Mid-low back tender to general palpation palpation and with proximal LE and pelvic movements  Neurological: She is alert and oriented to person, place, and time.  UE 4/5 deltoid, bicep, tricep, HI. Bilateral  LE: 3/5 HF 4ke and 4/5 at ankles. No sensory loss in any of limbs. DTR's 1+.   Reasonable insight and awareness. Good attention, memory functional Skin: Skin is warm and dry.  Psychiatric:  Flat but otherwise appropriate    Results for orders placed during the hospital encounter of 03/24/14 (from the past 48 hour(s))  GLUCOSE, CAPILLARY     Status: Abnormal   Collection Time    03/29/14  4:21 PM      Result Value Ref Range   Glucose-Capillary 158 (*) 70 - 99 mg/dL   Comment 1 Notify RN    GLUCOSE, CAPILLARY     Status: Abnormal   Collection Time    03/29/14  9:06 PM      Result Value Ref Range   Glucose-Capillary 185 (*) 70 - 99 mg/dL  GLUCOSE, CAPILLARY     Status: Abnormal   Collection Time    03/29/14 11:19 PM      Result Value Ref Range   Glucose-Capillary 180 (*) 70 - 99 mg/dL  GLUCOSE, CAPILLARY     Status: Abnormal   Collection Time    03/30/14  5:18 AM      Result Value Ref Range   Glucose-Capillary 155 (*) 70 - 99 mg/dL  PROTIME-INR     Status: None   Collection Time    03/30/14  5:54 AM      Result Value Ref Range   Prothrombin Time 15.0  11.6 - 15.2 seconds   INR 1.21  0.00 - 1.49  CBC     Status: Abnormal   Collection Time    03/30/14  5:54 AM      Result Value Ref Range   WBC 11.2 (*) 4.0 - 10.5 K/uL   Comment: WHITE COUNT CONFIRMED ON SMEAR   RBC 2.96 (*) 3.87 - 5.11 MIL/uL   Hemoglobin 7.7 (*) 12.0 - 15.0 g/dL   HCT 24.4 (*) 36.0 - 46.0 %   MCV 82.4  78.0 - 100.0 fL   MCH 26.0  26.0 - 34.0 pg   MCHC 31.6  30.0 - 36.0 g/dL   RDW 19.2 (*) 11.5 - 15.5 %   Platelets 145 (*) 150 - 400 K/uL  BASIC METABOLIC PANEL     Status: Abnormal   Collection Time    03/30/14  5:54 AM      Result Value Ref Range   Sodium 136 (*) 137 - 147 mEq/L   Potassium 4.1  3.7 - 5.3 mEq/L   Chloride 93 (*) 96 - 112 mEq/L   CO2 26  19 - 32 mEq/L   Glucose, Bld 148 (*) 70 - 99 mg/dL   BUN 26 (*) 6 - 23 mg/dL   Creatinine, Ser 1.19 (*)  0.50 - 1.10 mg/dL   Calcium 8.6  8.4 - 10.5 mg/dL   GFR calc non Af Amer 47 (*) >90 mL/min   GFR calc Af Amer 54 (*)  >90 mL/min   Comment: (NOTE)     The eGFR has been calculated using the CKD EPI equation.     This calculation has not been validated in all clinical situations.     eGFR's persistently <90 mL/min signify possible Chronic Kidney     Disease.  BONE MARROW EXAM     Status: None   Collection Time    03/30/14 11:45 AM      Result Value Ref Range   Bone Marrow Exam SEE PATHOLOGY REPORT [id number]     Comment: Performed at [place], CAPILLARY     Status: Abnormal   Collection Time    03/30/14  1:46 PM      Result Value Ref Range   Glucose-Capillary 172 (*) 70 - 99 mg/dL   Comment 1 Notify RN    GLUCOSE, CAPILLARY     Status: Abnormal   Collection Time    03/30/14  4:45 PM      Result Value Ref Range   Glucose-Capillary 171 (*) 70 - 99 mg/dL   Comment 1 Documented in Chart     Comment 2 Notify RN    GLUCOSE, CAPILLARY     Status: Abnormal   Collection Time    03/30/14  8:47 PM      Result Value Ref Range   Glucose-Capillary 120 (*) 70 - 99 mg/dL   Comment 1 Documented in Chart     Comment 2 Notify RN    GLUCOSE, CAPILLARY     Status: Abnormal   Collection Time    03/31/14 12:42 AM      Result Value Ref Range   Glucose-Capillary 141 (*) 70 - 99 mg/dL  PROTIME-INR     Status: Abnormal   Collection Time    03/31/14  4:57 AM      Result Value Ref Range   Prothrombin Time 16.7 (*) 11.6 - 15.2 seconds   INR 1.39  0.00 - 1.49  CBC     Status: Abnormal   Collection Time    03/31/14  4:57 AM      Result Value Ref Range   WBC 11.7 (*) 4.0 - 10.5 K/uL   RBC 2.70 (*) 3.87 - 5.11 MIL/uL   Hemoglobin 7.2 (*) 12.0 - 15.0 g/dL   HCT 22.5 (*) 36.0 - 46.0 %   MCV 83.3  78.0 - 100.0 fL   MCH 26.7  26.0 - 34.0 pg   MCHC 32.0  30.0 - 36.0 g/dL   RDW 19.7 (*) 11.5 - 15.5 %   Platelets 145 (*) 150 - 400 K/uL  BASIC METABOLIC PANEL     Status: Abnormal   Collection Time    03/31/14  4:57 AM      Result Value Ref Range   Sodium 137  137 - 147 mEq/L    Potassium 4.2  3.7 - 5.3 mEq/L   Chloride 96  96 - 112 mEq/L   CO2 25  19 - 32 mEq/L   Glucose, Bld 136 (*) 70 - 99 mg/dL   BUN 27 (*) 6 - 23 mg/dL   Creatinine, Ser 1.28 (*) 0.50 - 1.10 mg/dL   Calcium 8.1 (*) 8.4 - 10.5 mg/dL   GFR calc non Af Amer 43 (*) >90 mL/min  GFR calc Af Amer 49 (*) >90 mL/min   Comment: (NOTE)     The eGFR has been calculated using the CKD EPI equation.     This calculation has not been validated in all clinical situations.     eGFR's persistently <90 mL/min signify possible Chronic Kidney     Disease.  GLUCOSE, CAPILLARY     Status: Abnormal   Collection Time    03/31/14  6:24 AM      Result Value Ref Range   Glucose-Capillary 128 (*) 70 - 99 mg/dL  GLUCOSE, CAPILLARY     Status: Abnormal   Collection Time    03/31/14 11:23 AM      Result Value Ref Range   Glucose-Capillary 171 (*) 70 - 99 mg/dL     Post Admission Physician Evaluation: 1. Functional deficits secondary  to deconditioning related multiple medical issues and L1 compression fx. 2. Patient is admitted to receive collaborative, interdisciplinary care between the physiatrist, rehab nursing staff, and therapy team. 3. Patient's level of medical complexity and substantial therapy needs in context of that medical necessity cannot be provided at a lesser intensity of care such as a SNF. 4. Patient has experienced substantial functional loss from his/her baseline which was documented above under the "Functional History" and "Functional Status" headings.  Judging by the patient's diagnosis, physical exam, and functional history, the patient has potential for functional progress which will result in measurable gains while on inpatient rehab.  These gains will be of substantial and practical use upon discharge  in facilitating mobility and self-care at the household level. 5. Physiatrist will provide 24 hour management of medical needs as well as oversight of the therapy plan/treatment and provide  guidance as appropriate regarding the interaction of the two. 6. 24 hour rehab nursing will assist with bladder management, bowel management, safety, skin/wound care, disease management, medication administration, pain management and patient education  and help integrate therapy concepts, techniques,education, etc. 7. PT will assess and treat for/with: Lower extremity strength, range of motion, stamina, balance, functional mobility, safety, adaptive techniques and equipment, pain mgt, back precautions, education, ego support.   Goals are: mod I. 8. OT will assess and treat for/with: ADL's, functional mobility, safety, upper extremity strength, adaptive techniques and equipment, back precautions, pain mgt, ego-support.   Goals are: mod I. 9. SLP will assess and treat for/with: n/a.  Goals are: n/a. 10. Case Management and Social Worker will assess and treat for psychological issues and discharge planning. 11. Team conference will be held weekly to assess progress toward goals and to determine barriers to discharge. 12. Patient will receive at least 3 hours of therapy per day at least 5 days per week. 13. ELOS: 7 days       14. Prognosis:  excellent   Medical Problem List and Plan: Deconditioning related to multiple medical issues including multiple myeloma, L1 compression fx 1. DVT Prophylaxis/Anticoagulation: Mechanical:  Antiembolism stockings, knee (TED hose) Bilateral lower extremities Sequential compression devices, below knee Bilateral lower extremities. Continues with hemoptysis.  2. Back Pain/Pain Management:  D/C oxycodone due to nausea and hallucinations. Reports tylenol and Sportscreme was effective today. Will add ultram prn for more severe pain. LSO for support and pain management.  3. Mood: LCSW to follow for evaluation and support. Mood better today.  4. Neuropsych: This patient is capable of making decisions on her own behalf. 5. B-CAP: Antibiotic D#  7/ 10-14?Marland Kitchen Changed to Levaquin.  Continue to wean oxygen as tolerated.  6. ABLA: Monitor serial H/H. Has not improved with 2 units of PRBC. Will order guaiacs also to assess for GI source 7. Multiple Myeloma: Follow up with Dr. Jana Hakim for treatment.  8. Acute renal failure: Slowly improving after diuresis. Encourage fluids 9. DM type 2: Will resume amaryl as po intake good. Monitor BS with ac/hs checks and use SSI as needed for elevated BS. Discontinue metformin with AKI as well as (likely)  MM diagnosis.    Meredith Staggers, MD, [facility]   03/31/2014

## 2014-03-31 NOTE — PMR Pre-admission (Signed)
PMR Admission Coordinator Pre-Admission Assessment  Patient: [name] is an 68 y.o., female MRN: [id number] DOB: [birth year] Height: _0  (160 cm) Weight: 82.146 kg (181 lb 1.6 oz)              Insurance Information HMO:      PPO:       PCP:       IPA:       80/20:       OTHER:   PRIMARY: Medicare A/B      Policy#: [id number]      Subscriber: Chauncy Lean CM Name:        Phone#:       Fax#:   Pre-Cert#:        Employer: Retired Benefits:  Phone #:       Name: Checked in [place]. Date: 06/17/12     Deduct: $1260      Out of Pocket Max: none      Life Max: unlimited CIR: 100%      SNF: 100 days Outpatient: 80%     Co-Pay: 20% Home Health: 100%      Co-Pay: none DME: 80%     Co-Pay: 20% Providers: patient's choice  SECONDARY: [employer]      Policy#: [id number]      Subscriber: Chauncy Lean CM Name:        Phone#:       Fax#:   Pre-Cert#:        Employer: Retired Benefits:  Phone #: [phone]     Name:   Eff. Date:       Deduct:        Out of Pocket Max:        Life Max:   CIR:        SNF:   Outpatient:       Co-Pay:   Home Health:        Co-Pay:   DME:       Co-Pay:     Emergency Contact Information Contact Information   Name Relation Home Work Mobile   [place] Spouse [phone]  [place] Relative [place] Son [phone]       Current Medical History  Patient Admitting Diagnosis: Deconditioning related to multiple medical issues, L1 compression fx    History of Present Illness: A 68 y.o. female with history of DM, neurofibromatosis, PE-chronic coumadin; who was admitted on 03/24/14 with hemoptysis, hematuria as well as severe back pain. Patient with bilateral infiltrates with supratherapeutic INR- 6.0 as well as drop in Hgb from 10.2 to 7.4. CTA negative for PE. She was started on IV antibiotics for HCAP coverage and coumadin reversed with 4 units FFP. She was transfused with 2 units PRBC  and Dr. Nelda Marseille recommended discontinuing anti-coagulation until hemoptysis resolves. Lumbar spine X-rays with L1 endplate compression fracture and NM bone scan with mottled appearance of bony structures with question of myeloma. Hypercalcemia treated with Zometa as well as calcitonin. Dr. Jana Hakim consulted for input and recommended bone marrow biopsy. 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 PT evaluation limited due to back pain and corset ordered for support?. Rehab team recommending CIR and patient will be admitted today.      Past Medical History  Past Medical History  Diagnosis Date  . Neurofibromatosis   . Diabetes mellitus   . HTN (hypertension)   . Obesity   . Osteoarthritis, knee   .  Hyperlipidemia     statin intolerant. LDL is 83  . UTI (lower urinary tract infection) 05/29/12  . 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 Pulmonary embolism [id number]    Family History  family history includes Arthritis in her mother; Heart disease in her mother and son; Stroke in her mother.  Prior Rehab/Hospitalizations:  Had [facility] therapy and outpatient therapy after L TKR 09/14   Current Medications  Current facility-administered medications:acetaminophen (TYLENOL) tablet 650 mg, 650 mg, Oral, Q6H PRN, Cordelia Poche, MD, 650 mg at 03/31/14 0826;  dextromethorphan-guaiFENesin ([place] DM) 30-600 MG per 12 hr tablet 1 tablet, 1 tablet, Oral, BID, Rush Farmer, MD, 1 tablet at 03/31/14 0929;  insulin aspart (novoLOG) injection 0-15 Units, 0-15 Units, Subcutaneous, TID WC, Leone Haven, MD, 3 Units at 03/31/14 1233 insulin aspart (novoLOG) injection 0-5 Units, 0-5 Units, Subcutaneous, QHS, Leone Haven, MD, 2 Units at 03/26/14 2202;  levofloxacin (LEVAQUIN) tablet 750 mg, 750 mg, Oral, Q48H, Cordelia Poche, MD, 750 mg at 03/30/14 1716;  metoprolol tartrate (LOPRESSOR) tablet 25 mg, 25 mg, Oral, BID, Phill Myron,  MD, 25 mg at 03/31/14 0929;  ondansetron (ZOFRAN) tablet 8 mg, 8 mg, Oral, Q8H PRN, Leone Haven, MD, 8 mg at 03/30/14 1248 oxyCODONE (Oxy IR/ROXICODONE) immediate release tablet 7.5 mg, 7.5 mg, Oral, Q4H PRN, Blane Ohara McDiarmid, MD, 7.5 mg at 03/29/14 1027;  polyethylene glycol (MIRALAX / GLYCOLAX) packet 17 g, 17 g, Oral, Daily, Todd D McDiarmid, MD;  senna-docusate (Senokot-S) tablet 2 tablet, 2 tablet, Oral, QHS, Todd D McDiarmid, MD, 2 tablet at 03/29/14 2326;  sodium chloride 0.9 % injection 3 mL, 3 mL, Intravenous, Q12H, Leone Haven, MD, 3 mL at 03/31/14 1245  Patients Current Diet: Carb Control  Precautions / Restrictions Precautions Precautions: Fall Other Brace/Splint: husband has not yet brought corset; discussed with him and he will bring Restrictions Weight Bearing Restrictions: No   Prior Activity Level Limited Community (1-2x/wk): Went out 2 X a week.  Went to MD appointments and out with husband for drives.  Home Assistive Devices / Equipment Home Assistive Devices/Equipment: CBG Meter;Wheelchair;Walker (specify type);Hospital bed Home Equipment: Gilford Rile - 2 wheels;Bedside commode;Cane - single point;Wheelchair - manual;Hospital bed  Prior Functional Level Prior Function Level of Independence: Needs assistance Gait / Transfers Assistance Needed: using RW could walk household distances; using RW x 2 weeks due to back pain; at times husband pushed in w/c due to pain (mostly in past week) ADL's / Homemaking Assistance Needed: Pt was I with most adls 3 weeks ago but now sponges bathes only and husband assists wtih LE bathing and dressing.  Current Functional Level Cognition  Overall Cognitive Status: Within Functional Limits for tasks assessed Orientation Level: Oriented X4    Extremity Assessment (includes Sensation/Coordination)          ADLs  Overall ADL's : Needs assistance/impaired Eating/Feeding: Independent Grooming: Set up;Sitting Upper Body Bathing:  Set up;Sitting Lower Body Bathing: Moderate assistance;Sit to/from stand Lower Body Bathing Details (indicate cue type and reason): Pt with difficulty reaching lower legs due to pain.  Crossing legs up painful on back as well. Upper Body Dressing : Minimal assistance;Sitting Upper Body Dressing Details (indicate cue type and reason): min assist due to pain. Lower Body Dressing: Total assistance (doff socks only) Lower Body Dressing Details (indicate cue type and reason): mod assist for socks and shoes and starting pants.  Pt would  benefit from being introduced to AE Toilet Transfer: Minimal assistance;+2 for safety/equipment;Ambulation;Comfort height toilet Toilet Transfer Details (indicate cue type and reason): Pt walked in room, then to [facility]. Pt cont to need cues to push up from surface she is leaving and reach back for surface she is going to. Toileting- Clothing Manipulation and Hygiene: Total assistance (due to pain) Functional mobility during ADLs: +2 for safety/equipment;Minimal assistance General ADL Comments: Pt with great amount of assist needed for all LE adls due to pain.    Mobility  Overal bed mobility: Needs Assistance Bed Mobility: Rolling;Sidelying to Sit;Sit to Sidelying Rolling: Supervision Sidelying to sit: Max assist;HOB elevated Sit to sidelying: Mod assist General bed mobility comments: pt with incr pain s/p lumbar biopsy and required incr assist to raise trunk to sitting; assist to raise legs to sidelying    Transfers  Overall transfer level: Needs assistance Equipment used: Rolling walker (2 wheeled) Transfers: Sit to/from Stand Sit to Stand: Mod assist;+2 physical assistance Stand pivot transfers: Min assist;+2 safety/equipment General transfer comment: vc for safe use of RW; assist from bed mod assist +2; from [facility] min assist (easier with elevated BSC and armrests)    Ambulation / Gait / Stairs / Wheelchair Mobility  Ambulation/Gait Ambulation/Gait assistance:  Min assist;+2 physical assistance Ambulation Distance (Feet): 22 Feet Assistive device: Rolling walker (2 wheeled) Gait Pattern/deviations: Step-through pattern;Decreased stride length Gait velocity interpretation: <1.8 ft/sec, indicative of risk for recurrent falls General Gait Details: very slow with incr bil UE due to pain    Posture / Balance Dynamic Sitting Balance Sitting balance - Comments: EOB about 4 minutes    Special needs/care consideration BiPAP/CPAP No CPM No Continuous Drip IV No Dialysis No        Life Vest No Oxygen Yes, at 1 1/2 L [place] in hospital Special Bed No Trach Size No Wound Vac (area) No      Skin No                               Bowel mgmt: Had BM 03/31/14 Bladder mgmt: Using BSC and bedpan Diabetic mgmt Yes, on oral medications at home.    Previous Home Environment Living Arrangements: Spouse/significant other Available Help at Discharge: Family;Available 24 hours/day Type of Home: House Home Layout: One level Home Access: Ramped entrance;Stairs to enter Entrance Stairs-Rails: Right Entrance Stairs-Number of Steps: 2 Bathroom Shower/Tub: Walk-in shower;Door [employer] Toilet: Standard Bathroom Accessibility: Yes How Accessible: Accessible via walker Home Care Services: No Additional Comments: Pt has been sponge bathing for last 3 weeks due to pain.  Discharge Living Setting Plans for Discharge Living Setting: Patient's home;House;Lives with (comment) (Lives with husband.) Type of Home at Discharge: House Discharge Home Layout: One level Discharge Home Access: Stairs to enter Entrance Stairs-Number of Steps: 2 Does the patient have any problems obtaining your medications?: No  Social/Family/Support Systems Patient Roles: Spouse;Parent (Has a husband and 2 sons.) Contact Information: Melynda Keller- spouse Anticipated Caregiver: Husband Anticipated Caregiver's Contact Information: Delrae Alfred - (h) [phone] (c)  [phone] Ability/Limitations of Caregiver: Husband is retired Careers adviser: 24/7 Discharge Plan Discussed with Primary Caregiver: Yes Is Caregiver In Agreement with Plan?: Yes Does Caregiver/Family have Issues with Lodging/Transportation while Pt is in Rehab?: No  Goals/Additional Needs Patient/Family Goal for Rehab: PT/OT mod I goals Expected length of stay: 7-10 days Cultural Considerations: [place].  Attends [employer] Dietary Needs: Carb mod med cal, thin liquids Equipment Needs:  TBD Pt/Family Agrees to Admission and willing to participate: Yes Program Orientation Provided & Reviewed with Pt/Caregiver Including Roles  & Responsibilities: Yes  Decrease burden of Care through IP rehab admission: N/A  Possible need for SNF placement upon discharge: Not anticipated  Patient Condition: This patient's condition remains as documented in the consult dated 03/31/14, in which the Rehabilitation Physician determined and documented that the patient's condition is appropriate for intensive rehabilitative care in an inpatient rehabilitation facility. Will admit to inpatient rehab today.  Preadmission Screen Completed By:  Retta Diones, 03/31/2014 2:35 PM ______________________________________________________________________   Discussed status with Dr. Naaman Plummer on 03/31/14 at 1443 and received telephone approval for admission today.  Admission Coordinator:  Retta Diones, time1443/Date04/14/15

## 2014-03-31 NOTE — Progress Notes (Signed)
Patient ID: [name], female   DOB: [birth year], 68 y.o.   MRN: [id number] Pt was admitted to room 4M04. Admiision vital sign are stable

## 2014-03-31 NOTE — Progress Notes (Signed)
FMTS Attending Daily Note:  Annabell Sabal MD  [phone] pager  Family Practice pager:  [phone] I have seen and examined this patient and have reviewed their chart. I have discussed this patient with the resident. I agree with the resident's findings, assessment and care plan.  Additionally:  1.  Blood dyscrasia:   - awaiting results of bone marrow biopsy.  Stable currently from this standpoint - bx healing well, no bleeding or signs of infection  2.  Back pain: - improved.  Still with some pain with ambulation.  Able to walk to sink in room yesterday - awaiting CIR for dispo  Alveda Reasons, MD 03/31/2014 9:56 AM

## 2014-03-31 NOTE — H&P (View-Only) (Signed)
Physical Medicine and Rehabilitation Admission H&P    Chief Complaint  Patient presents with  . Deconditioning    HPI: [name] is a 68 y.o. female with history of DM, neurofibromatosis, PE-chronic coumadin; who was admitted on 03/24/14 with hemoptysis, hematuria as well as severe back pain. Patient with bilateral infiltrates with supratherapeutic INR- 6.0 as well as drop in Hgb from 10.2 to 7.4. CTA negative for PE. She was started on IV antibiotics for HCAP coverage and coumadin reversed with 4 units FFP. She was transfused with 2 units PRBC and Dr. Nelda Marseille recommended discontinuing anti-coagulation until hemoptysis resolves. Lumbar spine X-rays with L1 endplate compression fracture and NM bone scan with mottled appearance of bony structures with question of myeloma. Hypercalcemia treated with Zometa as well as calcitonin. Dr. Jana Hakim consulted for input and recommended bone marrow biopsy. 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 PT evaluation limited due to back pain and corset ordered for support?. Rehab team recommending CIR and patient admitted today.    Review of Systems  HENT: Negative for hearing loss.   Eyes: Negative for blurred vision and double vision.  Respiratory: Positive for hemoptysis (not as dark per patient). Negative for cough and wheezing.   Gastrointestinal: Negative for abdominal pain and constipation.  Genitourinary: Positive for frequency. Negative for dysuria.  Musculoskeletal: Positive for back pain and myalgias.  Neurological: Negative for dizziness, sensory change, focal weakness and headaches.  Psychiatric/Behavioral: Positive for hallucinations (last night). Negative for depression. The patient is nervous/anxious. The patient does not have insomnia.      Past Medical History  Diagnosis Date  . Neurofibromatosis   . Diabetes mellitus   . HTN (hypertension)   . Obesity   . Osteoarthritis, knee   . Hyperlipidemia    statin intolerant. LDL is 83  . UTI (lower urinary tract infection) 05/29/12  . 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 Pulmonary embolism [id number]    Past Surgical History  Procedure Laterality Date  . Cardiac catheterization    . Tonsillectomy and adenoidectomy    . Clavicle surgery    . Abdominal hysterectomy    . Ankle fracture surgery Right   . Total knee arthroplasty Left 09/15/2013    Procedure: TOTAL KNEE ARTHROPLASTY;  Surgeon: Vickey Huger, MD;  Location: [facility];  Service: Orthopedics;  Laterality: Left;   Family History  Problem Relation Age of Onset  . Heart disease Mother   . Arthritis Mother   . Stroke Mother   . Heart disease Son     Social History: Married. Retired from [employer]. Has been requiring physical assistance for last 4 weeks due to severe back pain. She reports that she has never smoked. She has never used smokeless tobacco. She reports that she does not drink alcohol or use illicit drugs.     Allergies  Allergen Reactions  . Codeine Nausea And Vomiting  . Hydrocodone Nausea And Vomiting  . Niaspan [Niacin Er] Nausea Only  . Robaxin [Methocarbamol] Other (See Comments)    Made patient feel funny  . Statins Nausea And Vomiting and Other (See Comments)    Myalgias/leg pain with atorvastatin, rosuvastatin, lovastatin, simvastatin  . Tetracycline Other (See Comments)    Pt doesn't remember reaction  . Zetia [Ezetimibe] Nausea And Vomiting  . Zithromax [Azithromycin] Nausea And Vomiting    Medications Prior to Admission  Medication Sig Dispense  Refill  . aspirin EC 81 MG tablet Take 81 mg by mouth at bedtime.      Marland Kitchen glimepiride (AMARYL) 2 MG tablet Take 4 mg by mouth 2 (two) times daily.       Marland Kitchen losartan (COZAAR) 25 MG tablet Take 25 mg by mouth daily.      . Menthol-Methyl Salicylate (MUSCLE RUB EX) Apply 1 application topically 3 (three) times daily as needed (back pain).      .  metFORMIN (GLUCOPHAGE) 1000 MG tablet Take 1 tablet (1,000 mg total) by mouth 2 (two) times daily.  180 tablet  3  . methocarbamol (ROBAXIN) 500 MG tablet Take 500-1,000 mg by mouth every 4 (four) hours as needed for muscle spasms.       . metoprolol succinate (TOPROL-XL) 25 MG 24 hr tablet Take 25 mg by mouth at bedtime.      . Multiple Vitamin (MULTIVITAMIN WITH MINERALS) TABS tablet Take 1 tablet by mouth at bedtime. Centrum Silver      . Multiple Vitamins-Minerals (OCUVITE PO) Take 1 tablet by mouth daily.       . nitroGLYCERIN (NITROSTAT) 0.4 MG SL tablet Place 1 tablet (0.4 mg total) under the tongue every 5 (five) minutes as needed for chest pain (up to 3 doses).  25 tablet  3  . traMADol (ULTRAM) 50 MG tablet Take 50-100 mg by mouth every 4 (four) hours as needed (pain).       . [DISCONTINUED] warfarin (COUMADIN) 5 MG tablet Take 2.5-5 mg by mouth daily at 6 PM. Take 1/2 tablet (2.5 mg) on Mondays and Fridays; take 1 tablet (5 mg) on Sunday, Tuesday, Wednesday, Thursday, Saturday        Home: Home Living Family/patient expects to be discharged to:: Private residence Living Arrangements: Spouse/significant other Available Help at Discharge: Family;Available 24 hours/day Type of Home: House Home Access: Ramped entrance;Stairs to enter Entrance Stairs-Number of Steps: 2 Entrance Stairs-Rails: Right Home Layout: One level Home Equipment: Walker - 2 wheels;Bedside commode;Cane - single point;Wheelchair - manual;Hospital bed Additional Comments: Pt has been sponge bathing for last 3 weeks due to pain.   Functional History:    Functional Status:  Mobility: min assist for basic transfers and mobility     Ambulation/Gait Ambulation Distance (Feet): 22 Feet General Gait Details: very slow with incr bil UE due to pain    ADL: ADL Lower Body Bathing Details (indicate cue type and reason): Pt with difficulty reaching lower legs due to pain.  Crossing legs up painful on back as  well. Upper Body Dressing Details (indicate cue type and reason): min assist due to pain. Lower Body Dressing Details (indicate cue type and reason): mod assist for socks and shoes and starting pants.  Pt would benefit from being introduced to AE Toilet Transfer Details (indicate cue type and reason): Pt walked in room, then to [facility]. Pt cont to need cues to push up from surface she is leaving and reach back for surface she is going to.  Cognition: Cognition Overall Cognitive Status: Within Functional Limits for tasks assessed Orientation Level: Oriented X4 Cognition Arousal/Alertness: Awake/alert Behavior During Therapy: WFL for tasks assessed/performed Overall Cognitive Status: Within Functional Limits for tasks assessed  Physical Exam: Blood pressure 117/60, pulse 91, temperature 98.2 F (36.8 C), temperature source Oral, resp. rate 20, height $RemoveBe'5\' 3"'BuLviZmhi$  (1.6 m), weight 82.146 kg (181 lb 1.6 oz), SpO2 97.00%. Physical Exam Nursing note and vitals reviewed.  Constitutional: She is oriented to person, place, and  time. She appears well-developed and well-nourished. Nasal cannula in place.  No distress HENT: oral mucosa pink and moist Head: Normocephalic and atraumatic.  Eyes: Conjunctivae are normal. Pupils are equal, round, and reactive to light.  Neck: Normal range of motion. Neck supple.  Cardiovascular: Normal rate and regular rhythm. No murmurs, rubs, or gallops Respiratory: Effort normal and breath sounds normal. No respiratory distress. She has no wheezes.  GI: Soft. Bowel sounds are normal. She exhibits no distension. There is no tenderness.  Musculoskeletal: She exhibits no edema and no tenderness.  Mid-low back tender to general palpation palpation and with proximal LE and pelvic movements  Neurological: She is alert and oriented to person, place, and time.  UE 4/5 deltoid, bicep, tricep, HI. Bilateral  LE: 3/5 HF 4ke and 4/5 at ankles. No sensory loss in any of limbs. DTR's 1+.   Reasonable insight and awareness. Good attention, memory functional Skin: Skin is warm and dry.  Psychiatric:  Flat but otherwise appropriate    Results for orders placed during the hospital encounter of 03/24/14 (from the past 48 hour(s))  GLUCOSE, CAPILLARY     Status: Abnormal   Collection Time    03/29/14  4:21 PM      Result Value Ref Range   Glucose-Capillary 158 (*) 70 - 99 mg/dL   Comment 1 Notify RN    GLUCOSE, CAPILLARY     Status: Abnormal   Collection Time    03/29/14  9:06 PM      Result Value Ref Range   Glucose-Capillary 185 (*) 70 - 99 mg/dL  GLUCOSE, CAPILLARY     Status: Abnormal   Collection Time    03/29/14 11:19 PM      Result Value Ref Range   Glucose-Capillary 180 (*) 70 - 99 mg/dL  GLUCOSE, CAPILLARY     Status: Abnormal   Collection Time    03/30/14  5:18 AM      Result Value Ref Range   Glucose-Capillary 155 (*) 70 - 99 mg/dL  PROTIME-INR     Status: None   Collection Time    03/30/14  5:54 AM      Result Value Ref Range   Prothrombin Time 15.0  11.6 - 15.2 seconds   INR 1.21  0.00 - 1.49  CBC     Status: Abnormal   Collection Time    03/30/14  5:54 AM      Result Value Ref Range   WBC 11.2 (*) 4.0 - 10.5 K/uL   Comment: WHITE COUNT CONFIRMED ON SMEAR   RBC 2.96 (*) 3.87 - 5.11 MIL/uL   Hemoglobin 7.7 (*) 12.0 - 15.0 g/dL   HCT 24.4 (*) 36.0 - 46.0 %   MCV 82.4  78.0 - 100.0 fL   MCH 26.0  26.0 - 34.0 pg   MCHC 31.6  30.0 - 36.0 g/dL   RDW 19.2 (*) 11.5 - 15.5 %   Platelets 145 (*) 150 - 400 K/uL  BASIC METABOLIC PANEL     Status: Abnormal   Collection Time    03/30/14  5:54 AM      Result Value Ref Range   Sodium 136 (*) 137 - 147 mEq/L   Potassium 4.1  3.7 - 5.3 mEq/L   Chloride 93 (*) 96 - 112 mEq/L   CO2 26  19 - 32 mEq/L   Glucose, Bld 148 (*) 70 - 99 mg/dL   BUN 26 (*) 6 - 23 mg/dL   Creatinine, Ser 1.19 (*)  0.50 - 1.10 mg/dL   Calcium 8.6  8.4 - 10.5 mg/dL   GFR calc non Af Amer 47 (*) >90 mL/min   GFR calc Af Amer 54 (*)  >90 mL/min   Comment: (NOTE)     The eGFR has been calculated using the CKD EPI equation.     This calculation has not been validated in all clinical situations.     eGFR's persistently <90 mL/min signify possible Chronic Kidney     Disease.  BONE MARROW EXAM     Status: None   Collection Time    03/30/14 11:45 AM      Result Value Ref Range   Bone Marrow Exam SEE PATHOLOGY REPORT [id number]     Comment: Performed at [place], CAPILLARY     Status: Abnormal   Collection Time    03/30/14  1:46 PM      Result Value Ref Range   Glucose-Capillary 172 (*) 70 - 99 mg/dL   Comment 1 Notify RN    GLUCOSE, CAPILLARY     Status: Abnormal   Collection Time    03/30/14  4:45 PM      Result Value Ref Range   Glucose-Capillary 171 (*) 70 - 99 mg/dL   Comment 1 Documented in Chart     Comment 2 Notify RN    GLUCOSE, CAPILLARY     Status: Abnormal   Collection Time    03/30/14  8:47 PM      Result Value Ref Range   Glucose-Capillary 120 (*) 70 - 99 mg/dL   Comment 1 Documented in Chart     Comment 2 Notify RN    GLUCOSE, CAPILLARY     Status: Abnormal   Collection Time    03/31/14 12:42 AM      Result Value Ref Range   Glucose-Capillary 141 (*) 70 - 99 mg/dL  PROTIME-INR     Status: Abnormal   Collection Time    03/31/14  4:57 AM      Result Value Ref Range   Prothrombin Time 16.7 (*) 11.6 - 15.2 seconds   INR 1.39  0.00 - 1.49  CBC     Status: Abnormal   Collection Time    03/31/14  4:57 AM      Result Value Ref Range   WBC 11.7 (*) 4.0 - 10.5 K/uL   RBC 2.70 (*) 3.87 - 5.11 MIL/uL   Hemoglobin 7.2 (*) 12.0 - 15.0 g/dL   HCT 22.5 (*) 36.0 - 46.0 %   MCV 83.3  78.0 - 100.0 fL   MCH 26.7  26.0 - 34.0 pg   MCHC 32.0  30.0 - 36.0 g/dL   RDW 19.7 (*) 11.5 - 15.5 %   Platelets 145 (*) 150 - 400 K/uL  BASIC METABOLIC PANEL     Status: Abnormal   Collection Time    03/31/14  4:57 AM      Result Value Ref Range   Sodium 137  137 - 147 mEq/L    Potassium 4.2  3.7 - 5.3 mEq/L   Chloride 96  96 - 112 mEq/L   CO2 25  19 - 32 mEq/L   Glucose, Bld 136 (*) 70 - 99 mg/dL   BUN 27 (*) 6 - 23 mg/dL   Creatinine, Ser 1.28 (*) 0.50 - 1.10 mg/dL   Calcium 8.1 (*) 8.4 - 10.5 mg/dL   GFR calc non Af Amer 43 (*) >90 mL/min  GFR calc Af Amer 49 (*) >90 mL/min   Comment: (NOTE)     The eGFR has been calculated using the CKD EPI equation.     This calculation has not been validated in all clinical situations.     eGFR's persistently <90 mL/min signify possible Chronic Kidney     Disease.  GLUCOSE, CAPILLARY     Status: Abnormal   Collection Time    03/31/14  6:24 AM      Result Value Ref Range   Glucose-Capillary 128 (*) 70 - 99 mg/dL  GLUCOSE, CAPILLARY     Status: Abnormal   Collection Time    03/31/14 11:23 AM      Result Value Ref Range   Glucose-Capillary 171 (*) 70 - 99 mg/dL     Post Admission Physician Evaluation: 1. Functional deficits secondary  to deconditioning related multiple medical issues and L1 compression fx. 2. Patient is admitted to receive collaborative, interdisciplinary care between the physiatrist, rehab nursing staff, and therapy team. 3. Patient's level of medical complexity and substantial therapy needs in context of that medical necessity cannot be provided at a lesser intensity of care such as a SNF. 4. Patient has experienced substantial functional loss from his/her baseline which was documented above under the "Functional History" and "Functional Status" headings.  Judging by the patient's diagnosis, physical exam, and functional history, the patient has potential for functional progress which will result in measurable gains while on inpatient rehab.  These gains will be of substantial and practical use upon discharge  in facilitating mobility and self-care at the household level. 5. Physiatrist will provide 24 hour management of medical needs as well as oversight of the therapy plan/treatment and provide  guidance as appropriate regarding the interaction of the two. 6. 24 hour rehab nursing will assist with bladder management, bowel management, safety, skin/wound care, disease management, medication administration, pain management and patient education  and help integrate therapy concepts, techniques,education, etc. 7. PT will assess and treat for/with: Lower extremity strength, range of motion, stamina, balance, functional mobility, safety, adaptive techniques and equipment, pain mgt, back precautions, education, ego support.   Goals are: mod I. 8. OT will assess and treat for/with: ADL's, functional mobility, safety, upper extremity strength, adaptive techniques and equipment, back precautions, pain mgt, ego-support.   Goals are: mod I. 9. SLP will assess and treat for/with: n/a.  Goals are: n/a. 10. Case Management and Social Worker will assess and treat for psychological issues and discharge planning. 11. Team conference will be held weekly to assess progress toward goals and to determine barriers to discharge. 12. Patient will receive at least 3 hours of therapy per day at least 5 days per week. 13. ELOS: 7 days       14. Prognosis:  excellent   Medical Problem List and Plan: Deconditioning related to multiple medical issues including multiple myeloma, L1 compression fx 1. DVT Prophylaxis/Anticoagulation: Mechanical:  Antiembolism stockings, knee (TED hose) Bilateral lower extremities Sequential compression devices, below knee Bilateral lower extremities. Continues with hemoptysis.  2. Back Pain/Pain Management:  D/C oxycodone due to nausea and hallucinations. Reports tylenol and Sportscreme was effective today. Will add ultram prn for more severe pain. LSO for support and pain management.  3. Mood: LCSW to follow for evaluation and support. Mood better today.  4. Neuropsych: This patient is capable of making decisions on her own behalf. 5. B-CAP: Antibiotic D#  7/ 10-14?Marland Kitchen Changed to Levaquin.  Continue to wean oxygen as tolerated.  6. ABLA: Monitor serial H/H. Has not improved with 2 units of PRBC. Will order guaiacs also to assess for GI source 7. Multiple Myeloma: Follow up with Dr. Jana Hakim for treatment.  8. Acute renal failure: Slowly improving after diuresis. Encourage fluids 9. DM type 2: Will resume amaryl as po intake good. Monitor BS with ac/hs checks and use SSI as needed for elevated BS. Discontinue metformin with AKI as well as (likely)  MM diagnosis.    Meredith Staggers, MD, [facility]   03/31/2014

## 2014-03-31 NOTE — Progress Notes (Signed)
Family Medicine Teaching Service Daily Progress Note Intern Pager: [phone]  Patient name: [name] Medical record number: [id number] Date of birth: 10-25-1946 Age: 68 y.o. Gender: female  Primary Care Provider: Jenny Reichmann, MD Consultants: PCCM (signed off) Code Status: full  Pt Overview and Major Events to Date:  - Hemoptysis thought old blood. PCCM signed off. - CTA with multifocal infiltrates - treating with levaquin - Bone irregularities on CTA - raising concern for MM. Bx planned for 4/13 - 4/10 developed fluid overload after IV fluids for hypercalcemia. Lasix x 1. - 4/13 plan for BM biopsy - 4/14 discussion re placement  Assessment and Plan: [name] is a 68 y.o. female presenting with hemoptysis and back pain of recent onset, causing significant physical impairment. PMH is significant for HTN, DM, STEMI, provoked PE on coumadin.  # Pulm: Hemoptysis, Bilateral infiltrates, O2 requirement thought CAP. Pt hemoglobin improved after second unit PRBCs but drifting down 7.4-> 7.2 this morning; INR 1.39 -PCCM signed off -Will monitor today for volume overload - consider redosing lasix if has increased WOB -hyperthrombotic 2/2 malignancy will need to reconsider anti-coag on d/c, defer to hemeonc -continue levaquin PO (4/9-)   # CV: HTN/Tachycardia - likely related to patients anemia and intravascular volume depletion as well as infection vs thyroid (TSH nml) vs CHF; -metoprolol 25 BID  -monitor for hypotension  # Renal: AKI (1.65-> 1.28)/hypercalcemia(15-> 8.1) -s/p NS and zometa 4 mg for hypercalcemia, consider holding calcitonin now with borderline low levels -will trend Cr  -monitor fluid status  # Heme: Supratherapeutic INR improved, Anemia with component iron deficiency. S/p BM biospy on Monday 4/13 -heme/onc consulted, appreciate recommendations, will touch base with today to assist in outpt management -cbcs daily -trend INR  -s/p feraheme; then oral  iron  # ID: Bilateral infiltrates, treating for CAP. Leukocytosis. -continue levaquin  -monitor fever curve  -f/up BCx NGx5daysand UCx neg (obtained after initiation)  # MSK: Vertebral lumbar spine fracture most likely 2/2 MM -received dilaudid yesterday following BM biopsy, attempt to wean to oxycodone 7.5 mg for pain -SPEP, UPEP, IFE - indicate MM -monitor for cord sx/compression  #Neuro- headache, cobwebs; suspect component of acute delirium 2/2 insomnia and pain -sch tylenol prn for headaches -minimize hallucinogenic medications -reorient as needed; night and day schedule  # FENGI: Hypoalbuminemic; constipated -NPO for procedure -senna-docusate/miralax, hold if diarrhea worse  Prophylaxis: SCDs, no chemical prophylaxis given recent bleed  Disposition: CIR vs home with HH; est with hemeonc f/up  Subjective: Pain improved after BM biopsy yesterday; Is attempting to move more about the room; discussion yest with son, husband and pt re CIR vs HH; pt preferring home; Having nml BMs; also noting seeing spider webs around the room yesterday around time of having headache  Objective: Temp:  [97.8 F (36.6 C)-98.2 F (36.8 C)] 98.2 F (36.8 C) (04/14 0510) Pulse Rate:  [87-116] 91 (04/14 0510) Resp:  [17-24] 20 (04/14 0510) BP: (117-149)/(60-81) 117/60 mmHg (04/14 0510) SpO2:  [87 %-97 %] 97 % (04/14 0510) Weight:  [181 lb 1.6 oz (82.146 kg)] 181 lb 1.6 oz (82.146 kg) (04/14 0510) Physical Exam: General: NAD, resting in bed with husband at bedside, tired-appearing HEENT: NCAT, [place] in place (turned down to 1.5L while in the room) Cardiovascular: rrr, no murmurs appreciated Respiratory: normal WOB, CTA in anterior fields Abdomen: soft, NT, ND, no guarding or rebound, obese Extremities: warm, well perfused, no edema; back with bandaid covered; no drainage or active bleeding Psych: more  interactive this morning; appears clinically depressed   Laboratory:  Recent Labs Lab  03/29/14 0530 03/30/14 0554 03/31/14 0457  WBC 10.5 11.2* 11.7*  HGB 7.4* 7.7* 7.2*  HCT 23.7* 24.4* 22.5*  PLT 159 145* 145*    Recent Labs Lab 03/24/14 1905  03/29/14 0530 03/30/14 0554 03/31/14 0457  NA 134*  < > 133* 136* 137  K 4.8  < > 4.6 4.1 4.2  CL 92*  < > 93* 93* 96  CO2 23  < > 22 26 25   BUN 28*  < > 26* 26* 27*  CREATININE 1.14*  < > 1.14* 1.19* 1.28*  CALCIUM 12.3*  < > 8.7 8.6 8.1*  PROT 11.2*  --   --   --   --   BILITOT 0.4  --   --   --   --   ALKPHOS 70  --   --   --   --   ALT 9  --   --   --   --   AST 36  --   --   --   --   GLUCOSE 111*  < > 153* 148* 136*  < > = values in this interval not displayed.   Recent Labs Lab 03/27/14 0350 03/28/14 0352 03/29/14 0530 03/30/14 0554 03/31/14 0457  INR 1.47 1.59* 1.43 1.21 1.39    UA large blood on adm  Imaging/Diagnostic Tests: CT angio IMPRESSION: 1. No evidence of pulmonary embolus. 2. Relatively diffuse multifocal pneumonia noted bilaterally. 3. Diffuse coronary artery calcifications seen. 4. Diffusely heterogeneous appearance to the attenuation of visualized osseous structures, somewhat more prominent than on the prior study and new from 2010. Would correlate for evidence of underlying systemic condition, or possibly metastatic disease. Bone scan or correlation with lab findings could be considered for further evaluation, as deemed clinically appropriate.  Bone scan IMPRESSION:  1. Findings consistent with old right posterior fifth and left  posterior ninth rib fractures. No evidence of metastatic disease.  2. Given the mottled appearance of the bony structures on recent  chest CT, to evaluate for myeloma serum protein electrophoresis  should be considered  EKG 4/11 - Sinus tach, frequent PVC, otherwise stable from prior (q waves II and III)  Langston Masker, MD 03/31/2014, 7:18 AM PGY-1, [facility] Intern pager: [phone], text pages welcome

## 2014-04-01 ENCOUNTER — Encounter (HOSPITAL_COMMUNITY): Payer: Medicare Other | Admitting: *Deleted

## 2014-04-01 ENCOUNTER — Inpatient Hospital Stay (HOSPITAL_COMMUNITY): Payer: Medicare Other | Admitting: Occupational Therapy

## 2014-04-01 ENCOUNTER — Telehealth: Payer: Self-pay | Admitting: Oncology

## 2014-04-01 ENCOUNTER — Inpatient Hospital Stay (HOSPITAL_COMMUNITY): Payer: Medicare Other | Admitting: *Deleted

## 2014-04-01 ENCOUNTER — Other Ambulatory Visit: Payer: Self-pay | Admitting: Oncology

## 2014-04-01 DIAGNOSIS — M8448XA Pathological fracture, other site, initial encounter for fracture: Secondary | ICD-10-CM

## 2014-04-01 DIAGNOSIS — R5381 Other malaise: Secondary | ICD-10-CM

## 2014-04-01 LAB — CBC
HEMATOCRIT: 23.5 % — AB (ref 36.0–46.0)
HEMOGLOBIN: 7.4 g/dL — AB (ref 12.0–15.0)
MCH: 26.2 pg (ref 26.0–34.0)
MCHC: 31.5 g/dL (ref 30.0–36.0)
MCV: 83.3 fL (ref 78.0–100.0)
Platelets: 141 10*3/uL — ABNORMAL LOW (ref 150–400)
RBC: 2.82 MIL/uL — AB (ref 3.87–5.11)
RDW: 20.3 % — ABNORMAL HIGH (ref 11.5–15.5)
WBC: 10.6 10*3/uL — ABNORMAL HIGH (ref 4.0–10.5)

## 2014-04-01 LAB — COMPREHENSIVE METABOLIC PANEL
ALBUMIN: 1.8 g/dL — AB (ref 3.5–5.2)
ALT: 8 U/L (ref 0–35)
AST: 26 U/L (ref 0–37)
Alkaline Phosphatase: 59 U/L (ref 39–117)
BUN: 25 mg/dL — ABNORMAL HIGH (ref 6–23)
CO2: 25 mEq/L (ref 19–32)
CREATININE: 1.23 mg/dL — AB (ref 0.50–1.10)
Calcium: 8 mg/dL — ABNORMAL LOW (ref 8.4–10.5)
Chloride: 96 mEq/L (ref 96–112)
GFR calc Af Amer: 52 mL/min — ABNORMAL LOW (ref >90)
GFR calc non Af Amer: 45 mL/min — ABNORMAL LOW (ref >90)
Glucose, Bld: 102 mg/dL — ABNORMAL HIGH (ref 70–99)
POTASSIUM: 4.1 meq/L (ref 3.7–5.3)
Sodium: 138 mEq/L (ref 137–147)
TOTAL PROTEIN: 9.9 g/dL — AB (ref 6.0–8.3)
Total Bilirubin: 0.4 mg/dL (ref 0.3–1.2)

## 2014-04-01 LAB — GLUCOSE, CAPILLARY
GLUCOSE-CAPILLARY: 114 mg/dL — AB (ref 70–99)
GLUCOSE-CAPILLARY: 121 mg/dL — AB (ref 70–99)
GLUCOSE-CAPILLARY: 154 mg/dL — AB (ref 70–99)
GLUCOSE-CAPILLARY: 160 mg/dL — AB (ref 70–99)
GLUCOSE-CAPILLARY: 94 mg/dL (ref 70–99)
Glucose-Capillary: 65 mg/dL — ABNORMAL LOW (ref 70–99)
Glucose-Capillary: 90 mg/dL (ref 70–99)

## 2014-04-01 MED ORDER — GLIMEPIRIDE 2 MG PO TABS
2.0000 mg | ORAL_TABLET | Freq: Every day | ORAL | Status: DC
  Administered 2014-04-02 – 2014-04-07 (×6): 2 mg via ORAL
  Filled 2014-04-01 (×9): qty 1

## 2014-04-01 NOTE — Progress Notes (Signed)
Occupational Therapy Session Note  Patient Details  Name: [name] MRN: [id number] Date of Birth: 17-Apr-1946  Today's Date: 04/01/2014 Time: 1062-6948 Time Calculation (min): 45 min  Skilled Therapeutic Interventions/Progress Updates:    Pt seen this session to address LB self and back precautions along with general activity tolerance during mobility. Pt was seated in recliner.  Pt worked on crossing her legs to [employer] and don her sneakers and tying them without difficulty.  Pt stated she was not aware of any back precautions, so a sign was made for her walker to remind her of the three precautions as she seems to have difficulty with recalling them.  Pt stood from recliner with min a and ambulated 40 feet with Rw and then another 40 feet to an arm chair. From w/c and arm chair pt stood with close supervision.  Attempted some light UE arom below shoulder level but all exercises were aggravating her back.  Pt went back to room in w/c and requested to lay down.  Mod A to get into bed to maintain precautions.  Pt resting in bed with call light in reach.  Therapy Documentation Precautions:  Precautions Precautions: Back;Fall Required Braces or Orthoses: Other Brace/Splint Other Brace/Splint: corset for comfort only Restrictions Weight Bearing Restrictions: No  Vital Signs: Therapy Vitals Temp: 98 F (36.7 C) Temp src: Oral Pulse Rate: 89 Resp: 18 BP: 118/67 mmHg Patient Position, if appropriate: Lying Oxygen Therapy SpO2: 90 % O2 Device: None (Room air) Pain: Pain Assessment Pain Assessment: 0-10 Pain Score: 6  Pain Type: Acute pain Pain Location: Back Pain Orientation: Lower Pain Descriptors / Indicators: Aching Nursing provided pt with pain meds  ADL: ADL Walk-In Shower Equipment: Shower seat with back  See FIM for current functional status  Therapy/Group: Individual Therapy  Harlene Ramus 04/01/2014, 2:46 PM

## 2014-04-01 NOTE — Progress Notes (Signed)
COURTESY NOTE:  67 y/o [place] woman with IgA kappa myeloma, multiple bone lesions, bone marrow biopsy 03/30/2014 results pending (being sent for cytogenetics and FISH for prognostication)  Patient will see me 04/24 at 3 PM to discuss treatment of her cancer; discussed with her today.  Appreciate your help to this patient! Please let me know if I can be of further help.

## 2014-04-01 NOTE — Progress Notes (Signed)
Patient information reviewed and entered into eRehab system by Regie Bunner, RN, CRRN, PPS Coordinator.  Information including medical coding and functional independence measure will be reviewed and updated through discharge.     Per nursing patient was given "Data Collection Information Summary for Patients in Inpatient Rehabilitation Facilities with attached "Privacy Act Statement-Health Care Records" upon admission.  

## 2014-04-01 NOTE — Progress Notes (Signed)
Occupational Therapy Assessment and Plan  Patient Details  Name: [name] MRN: [id number] Date of Birth: [birth year]  OT Diagnosis: acute pain and muscle weakness (generalized) Rehab Potential: Rehab Potential: Excellent ELOS: 7-10 days   Today's Date: 04/01/2014 Time: 9628-3662 Time Calculation (min): 9 min  Problem List:  Patient Active Problem List   Diagnosis Date Noted  . Physical deconditioning 03/31/2014  . Hypercalcemia 03/25/2014  . Major hemoptysis 03/25/2014  . Acute blood loss anemia 03/25/2014  . Vertebral fracture, closed 03/25/2014  . Hemoptysis 03/24/2014  . Pneumonia, organism unspecified 03/24/2014  . Encounter for therapeutic drug monitoring 01/14/2014  . Fe deficiency anemia 12/31/2013  . Long term (current) use of anticoagulants 09/24/2013  . Pulmonary embolism 09/18/2013  . Acute pulmonary embolism 09/17/2013  . Hypoxemia 09/17/2013  . Onychomycosis 04/29/2013  . Pain in joint, ankle and foot 04/29/2013  . Benign hypertensive heart disease without heart failure 10/15/2012  . STEMI (ST elevation myocardial infarction) 05/30/2012  . Dyspnea 11/15/2011  . Herpes zoster 07/17/2011  . Fatigue 03/10/2011  . Chest pain at rest 03/10/2011  . Acute sinusitis, unspecified 03/10/2011  . Neurofibromatosis   . Type II or unspecified type diabetes mellitus without mention of complication, uncontrolled   . HTN (hypertension)   . Obesity   . Osteoarthritis, knee     Past Medical History:  Past Medical History  Diagnosis Date  . Neurofibromatosis   . Diabetes mellitus   . HTN (hypertension)   . Obesity   . Osteoarthritis, knee   . Hyperlipidemia     statin intolerant. LDL is 83  . UTI (lower urinary tract infection) 05/29/12  . 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 Pulmonary embolism [id number]   Past Surgical History:  Past Surgical History  Procedure Laterality Date  . Cardiac  catheterization    . Tonsillectomy and adenoidectomy    . Clavicle surgery    . Abdominal hysterectomy    . Ankle fracture surgery Right   . Total knee arthroplasty Left 09/15/2013    Procedure: TOTAL KNEE ARTHROPLASTY;  Surgeon: Vickey Huger, MD;  Location: [facility];  Service: Orthopedics;  Laterality: Left;    Assessment & Plan Clinical Impression: [name] is a 68 y.o. female with history of DM, neurofibromatosis, PE-chronic coumadin; who was admitted on 03/24/14 with hemoptysis, hematuria as well as severe back pain. Patient with bilateral infiltrates with supratherapeutic INR- 6.0 as well as drop in Hgb from 10.2 to 7.4. CTA negative for PE. She was started on IV antibiotics for HCAP coverage and coumadin reversed with 4 units FFP. She was transfused with 2 units PRBC and Dr. Nelda Marseille recommended discontinuing anti-coagulation until hemoptysis resolves. Lumbar spine X-rays with L1 endplate compression fracture and NM bone scan with mottled appearance of bony structures with question of myeloma. Hypercalcemia treated with Zometa as well as calcitonin. Dr. Jana Hakim consulted for input and recommended bone marrow biopsy. 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 PT evaluation limited due to back pain and corset ordered for support. Patient transferred to CIR on 03/31/2014 .    Patient currently requires mod with basic self-care skills secondary to muscle weakness and decreased sitting balance, decreased standing balance and difficulty maintaining precautions.  Prior to hospitalization, patient could complete adls with independent .  Patient will benefit from skilled intervention to decrease level of assist with basic self-care skills prior to discharge home  with care partner.  Anticipate patient will require minimal physical assistance and follow up home health.  OT - End of Session Activity Tolerance: Decreased this session Endurance Deficit: Yes Endurance Deficit  Description: fatigues quickly OT Assessment Rehab Potential: Excellent OT Patient demonstrates impairments in the following area(s): Balance;Endurance;Safety;Pain OT Basic ADL's Functional Problem(s): Eating;Grooming;Bathing;Dressing;Toileting OT Transfers Functional Problem(s): Toilet;Tub/Shower OT Additional Impairment(s): None OT Plan OT Intensity: Minimum of 1-2 x/day, 45 to 90 minutes OT Frequency: 5 out of 7 days OT Duration/Estimated Length of Stay: 7-10 days OT Treatment/Interventions: Balance/vestibular training;Community reintegration;Discharge planning;DME/adaptive equipment instruction;Functional mobility training;Pain management;Patient/family education;Therapeutic Activities;Therapeutic Exercise;UE/LE Strength taining/ROM;UE/LE Coordination activities OT Self Feeding Anticipated Outcome(s): independent OT Basic Self-Care Anticipated Outcome(s): supervision OT Toileting Anticipated Outcome(s): supervision OT Bathroom Transfers Anticipated Outcome(s): supervision OT Recommendation Patient destination: Home Equipment Recommended: None recommended by OT Equipment Details: has all necessary DME   Skilled Therapeutic Intervention Evaluation complete and POC established. Pt complete bathing supine and able to demonstrate crossing BIL LE. Pt log rolling to the left using bed rail supervision. Pt when log roll Rt side min cueing for back precautions. Pt requires mod (A) supine <>sit with severe pain. Pt requires (A) to pull up underwear and pants. Pt was unable to rotate bra from front to back once closed. Pt complete sink oral care. Pt and husband shown family room and obtained coffee. Pt ending session in w/c with all needs in reach.   OT Evaluation Precautions/Restrictions  Precautions Precautions: Back;Fall Required Braces or Orthoses: Other Brace/Splint Other Brace/Splint: corset Restrictions Weight Bearing Restrictions: No General Missed Time Reason: Pain (LBP) Vital  Signs Therapy Vitals Temp: 98 F (36.7 C) Temp src: Oral Pulse Rate: 89 Resp: 18 BP: 118/67 mmHg Patient Position, if appropriate: Lying Oxygen Therapy SpO2: 90 % O2 Device: None (Room air) Pain Pain Assessment Pain Assessment: 0-10 Pain Score: 10-Worst pain ever Pain Type: Acute pain Pain Location: Back Pain Orientation: Lower Pain Descriptors / Indicators: Sharp;Shooting Pain Onset: On-going Pain Intervention(s): RN made aware;Repositioned Multiple Pain Sites: No Home Living/Prior [place] expects to be discharged to:: Private residence Living Arrangements: Spouse/significant other Available Help at Discharge: Family;Available 24 hours/day Type of Home: House Home Access: Ramped entrance;Stairs to enter Entrance Stairs-Number of Steps: 2 Entrance Stairs-Rails: Right Home Layout: One level Additional Comments: pt has DME from mother: hospital bed, lift, RW, w/c, 3n1, shower seat,   Lives With: Spouse IADL History Homemaking Responsibilities: Yes Laundry Responsibility: Primary Current License: Yes Mode of Transportation: Car Prior Function Level of Independence: Requires assistive device for independence  Able to Take Stairs?: Yes Driving: Yes Vocation: Retired Leisure: Hobbies-yes (Comment) Comments: gardening and loves to cook ADL ADL Youth worker: Shower seat with back ADL Comments: see FIM Vision/Perception     Cognition Overall Cognitive Status: Within Functional Limits for tasks assessed Arousal/Alertness: Awake/alert Orientation Level: Oriented X4 Safety/Judgment: Appears intact Sensation Sensation Light Touch: Appears Intact Hot/Cold: Appears Intact Proprioception: Appears Intact Coordination Gross Motor Movements are Fluid and Coordinated: Yes Fine Motor Movements are Fluid and Coordinated: Yes Motor  Motor Motor: Within Functional Limits Mobility  Bed Mobility Bed Mobility: Supine to  Sit;Sitting - Scoot to Edge of Bed Supine to Sit: 3: Mod assist;HOB elevated;With rails Supine to Sit Details: Tactile cues for sequencing;Tactile cues for posture Supine to Sit Details (indicate cue type and reason): Pt with severe pain. Educated on log roll for back precautions Sitting - Scoot to Edge of Bed: 4: Min assist;With rail Transfers Sit to  Stand: 3: Mod assist;From bed Stand to Sit: 3: Mod assist;To chair/3-in-1  Trunk/Postural Assessment  Cervical Assessment Cervical Assessment: Exceptions to [facility] (forward neck posture) Thoracic Assessment Thoracic Assessment: Exceptions to [facility] (kyphotic posture) Lumbar Assessment Lumbar Assessment: Within Functional Limits Postural Control Postural Control: Within Functional Limits  Balance Balance Balance Assessed: Yes Static Sitting Balance Static Sitting - Balance Support: Bilateral upper extremity supported;Feet supported Static Sitting - Level of Assistance: 3: Mod assist Static Sitting - Comment/# of Minutes: severe pain, white knuckle grabbing mattress Static Standing Balance Static Standing - Balance Support: Bilateral upper extremity supported;During functional activity Static Standing - Level of Assistance: 5: Stand by assistance Static Standing - Comment/# of Minutes: approx 25" to replace w/c cushion Extremity/Trunk Assessment RUE Assessment RUE Assessment: Within Functional Limits LUE Assessment LUE Assessment: Within Functional Limits  FIM:  FIM - Grooming Grooming Steps: Wash, rinse, dry face;Wash, rinse, dry hands;Oral care, brush teeth, clean dentures Grooming: 4: Patient completes 3 of 4 or 4 of 5 steps FIM - Bathing Bathing Steps Patient Completed: Chest;Right Arm;Left Arm;Abdomen;Front perineal area;Right upper leg;Left upper leg;Right lower leg (including foot);Left lower leg (including foot) Bathing: 4: Min-Patient completes 8-9 41f 10 parts or 75+ percent (supine) FIM - Upper Body Dressing/Undressing Upper  body dressing/undressing steps patient completed: Thread/unthread right sleeve of pullover shirt/dresss;Thread/unthread left sleeve of pullover shirt/dress;Put head through opening of pull over shirt/dress;Pull shirt over trunk Upper body dressing/undressing: 3: Mod-Patient completed 50-74% of tasks FIM - Lower Body Dressing/Undressing Lower body dressing/undressing steps patient completed: Thread/unthread right underwear leg;Thread/unthread left underwear leg;Pull underwear up/down;Thread/unthread right pants leg;Thread/unthread left pants leg;Pull pants up/down;Don/Doff right sock;Don/Doff left sock;Don/Doff right shoe;Don/Doff left shoe Lower body dressing/undressing: 3: Mod-Patient completed 50-74% of tasks (thread bil LE into clothing supine (A) to pull up in standin) FIM - Bed/Chair Transfer Bed/Chair Transfer Assistive Devices: Adult nurse Transfer: 3: Supine > Sit: Mod A (lifting assist/Pt. 50-74%/lift 2 legs;3: Bed > Chair or W/C: Mod A (lift or lower assist);3: Chair or W/C > Bed: Mod A (lift or lower assist)   Refer to Care Plan for Long Term Goals  Recommendations for other services: None  Discharge Criteria: Patient will be discharged from OT if patient refuses treatment 3 consecutive times without medical reason, if treatment goals not met, if there is a change in medical status, if patient makes no progress towards goals or if patient is discharged from hospital.  The above assessment, treatment plan, treatment alternatives and goals were discussed and mutually agreed upon: by patient and by family  Peri Maris Pager: [phone]  04/01/2014, 3:19 PM

## 2014-04-01 NOTE — Progress Notes (Signed)
[facility] PHYSICAL MEDICINE & REHABILITATION     PROGRESS NOTE    Subjective/Complaints: Had a reasonable night except for sugar of 60 in the late evening. Appetite better. Pain controlled. A 12 point review of systems has been performed and if not noted above is otherwise negative.   Objective: Vital Signs: Blood pressure 138/81, pulse 87, temperature 98 F (36.7 C), temperature source Oral, resp. rate 18, weight 81.2 kg (179 lb 0.2 oz), SpO2 94.00%. Ct Biopsy  03/30/2014   CLINICAL DATA:  68 year old female with numerous lytic bony lesions concerning for multiple myeloma. CT-guided bone marrow biopsy is requested to confirm the diagnosis.  EXAM: CT BIOPSY  Date: 03/30/2014  PROCEDURE: 1. CT guided bone marrow aspiration and core biopsy Interventional Radiologist:  Criselda Peaches, MD  ANESTHESIA/SEDATION: Moderate (conscious) sedation was used. 0.5 mg Versed, 50 mcg Fentanyl were administered intravenously. The patient's vital signs were monitored continuously by radiology nursing throughout the procedure.  Sedation Time: 10 minutes  TECHNIQUE: Informed consent was obtained from the patient following explanation of the procedure, risks, benefits and alternatives. The patient understands, agrees and consents for the procedure. All questions were addressed. A time out was performed.  The patient was positioned prone and noncontrast localization CT was performed of the pelvis to demonstrate the iliac marrow spaces.  Maximal barrier sterile technique utilized including caps, mask, sterile gowns, sterile gloves, large sterile drape, hand hygiene, and betadine prep.  Under sterile conditions and local anesthesia, an 11 gauge coaxial bone biopsy needle was advanced into the left iliac marrow space. Needle position was confirmed with CT imaging. Initially, bone marrow aspiration was performed. Next, the 11 gauge outer cannula was utilized to obtain a left iliac bone marrow core biopsy. Needle was  removed. Hemostasis was obtained with compression. The patient tolerated the procedure well. Samples were prepared with the cytotechnologist. No immediate complications.  IMPRESSION: CT guided left iliac bone marrow aspiration and core biopsy.  Signed,  Criselda Peaches, MD  [facility]  [facility]   Electronically Signed   By: Jacqulynn Cadet M.D.   On: 03/30/2014 13:45    Recent Labs  03/31/14 0457 04/01/14 0540  WBC 11.7* 10.6*  HGB 7.2* 7.4*  HCT 22.5* 23.5*  PLT 145* 141*    Recent Labs  03/31/14 0457 04/01/14 0540  NA 137 138  K 4.2 4.1  CL 96 96  GLUCOSE 136* 102*  BUN 27* 25*  CREATININE 1.28* 1.23*  CALCIUM 8.1* 8.0*   CBG (last 3)   Recent Labs  03/31/14 2359 04/01/14 0025 04/01/14 0729  GLUCAP 94 114* 154*    Wt Readings from Last 3 Encounters:  04/01/14 81.2 kg (179 lb 0.2 oz)  03/31/14 82.146 kg (181 lb 1.6 oz)  03/21/14 86.183 kg (190 lb)    Physical Exam:  Nursing note and vitals reviewed.  Constitutional: She is oriented to person, place, and time. She appears well-developed and well-nourished. Nasal cannula in place. No distress  HENT: oral mucosa pink and moist  Head: Normocephalic and atraumatic.  Eyes: Conjunctivae are normal. Pupils are equal, round, and reactive to light.  Neck: Normal range of motion. Neck supple.  Cardiovascular: Normal rate and regular rhythm. No murmurs, rubs, or gallops  Respiratory: Effort normal and breath sounds normal. No respiratory distress. She has no wheezes.  GI: Soft. Bowel sounds are normal. She exhibits no distension. There is no tenderness.  Musculoskeletal: She exhibits no edema and no tenderness.  Mid-low back tender to general palpation palpation and with proximal LE and pelvic movements  Neurological: She is alert and oriented to person, place, and time.  UE 4/5 deltoid, bicep, tricep, HI. Bilateral LE: 3/5 HF 4ke and 4/5 at ankles. No sensory loss in  any of limbs. DTR's 1+. Reasonable insight and awareness. Good attention, memory functional  Skin: Skin is warm and dry.  Psychiatric:    appropriate    Assessment/Plan: 1. Functional deficits secondary to deconditioning related to multiple myeloma as well as L1 compression fx which require 3+ hours per day of interdisciplinary therapy in a comprehensive inpatient rehab setting. Physiatrist is providing close team supervision and 24 hour management of active medical problems listed below. Physiatrist and rehab team continue to assess barriers to discharge/monitor patient progress toward functional and medical goals. FIM:                                  Medical Problem List and Plan:  Deconditioning related to multiple medical issues including multiple myeloma, L1 compression fx  1. DVT Prophylaxis/Anticoagulation: Mechanical: Antiembolism stockings, knee (TED hose) Bilateral lower extremities  Sequential compression devices, below knee Bilateral lower extremities. Continues with hemoptysis.  2. Back Pain/Pain Management: D/C oxycodone due to nausea and hallucinations. Reports tylenol and Sportscreme was effective today.   ultram prn for more severe pain. LSO for support and pain management.  3. Mood: LCSW to follow for evaluation and support. Mood better today.  4. Neuropsych: This patient is capable of making decisions on her own behalf.  5. B-CAP: Antibiotic D# 8/ 10-14?Marland Kitchen Changed to Levaquin. Continue to wean oxygen as tolerated.   -wean oxygen 6. ABLA: Monitor serial H/H. hgb 7.4---stool for ob 7. Multiple Myeloma: Follow up with Dr. Jana Hakim for treatment/discussion in about a week or so 8. Acute renal failure: Slowly improving after diuresis. Encourage fluids  9. DM type 2: resumed amaryl as po intake good. However had low sugar last night  -change amaryl to qd only for now  -. Discontinue metformin with AKI as well as (likely) MM diagnosis.     LOS (Days)  1 A FACE TO FACE EVALUATION WAS PERFORMED  Meredith Staggers 04/01/2014 7:58 AM

## 2014-04-01 NOTE — Progress Notes (Signed)
Hypoglycemic Event  CBG: 65  Treatment: 15 GM carbohydrate snack   Symptoms: None  Follow-up CBG: Time:2359 CBG Result:94  Possible Reasons for Event: Unknown  Comments/MD notified:    Bentzion Dauria A Haven Pylant  Remember to initiate Hypoglycemia Order Set & complete

## 2014-04-01 NOTE — Progress Notes (Signed)
Physical Therapy Session Note  Patient Details  Name: [name] MRN: [id number] Date of Birth: [birth year]  Today's Date: 04/01/2014 Time: 0109-3235 Time Calculation (min): 9 min  Short Term Goals: Week 1:  PT Short Term Goal 1 (Week 1): STGs=LTGs due to ELOS  Skilled Therapeutic Interventions/Progress Updates:    Patient received supine in bed, asleep. Patient supine>sit with HOB semi-elevated and use of bed rails with minA, emphasis on log rolling technique. Patient crying out in pain, unable to decrease pain. Once sitting EOB, patient states pain is "a little better." Encouraged patient to scoot to EOB in order to transfer to wheelchair and patient yelling out in pain. Patient expressing desire to return to supine because its the "only position (she's) comfortable in". Patient requires minA for sit>supine for L LE management, emphasis on propped on elbow first for optimal log rolling technique. Patient left supine in bed with all needs within reach.  Therapy Documentation Precautions:  Precautions Precautions: Back;Fall Required Braces or Orthoses: Other Brace/Splint Other Brace/Splint: corset for comfort only Restrictions Weight Bearing Restrictions: No General: Amount of Missed PT Time (min): 21 Minutes Missed Time Reason: Pain (LBP) Pain: Pain Assessment Pain Assessment: 0-10 Pain Score: 10-Worst pain ever Pain Type: Acute pain Pain Location: Back Pain Orientation: Lower Pain Descriptors / Indicators: Sharp;Shooting Pain Onset: On-going Pain Intervention(s): RN made aware;Repositioned Multiple Pain Sites: No  See FIM for current functional status  Therapy/Group: Individual Therapy  Lillia Abed. Cleave Ternes, PT, DPT 04/01/2014, 3:01 PM

## 2014-04-01 NOTE — Evaluation (Signed)
Physical Therapy Assessment and Plan  Patient Details  Name: [name] MRN: [id number] Date of Birth: 1946/11/28  PT Diagnosis: Difficulty walking, Low back pain and Muscle weakness Rehab Potential: Good ELOS: 7-9 days   Today's Date: 04/01/2014 Time: 8299-3716 Time Calculation (min): 46 min  Problem List:  Patient Active Problem List   Diagnosis Date Noted  . Physical deconditioning 03/31/2014  . Hypercalcemia 03/25/2014  . Major hemoptysis 03/25/2014  . Acute blood loss anemia 03/25/2014  . Vertebral fracture, closed 03/25/2014  . Hemoptysis 03/24/2014  . Pneumonia, organism unspecified 03/24/2014  . Encounter for therapeutic drug monitoring 01/14/2014  . Fe deficiency anemia 12/31/2013  . Long term (current) use of anticoagulants 09/24/2013  . Pulmonary embolism 09/18/2013  . Acute pulmonary embolism 09/17/2013  . Hypoxemia 09/17/2013  . Onychomycosis 04/29/2013  . Pain in joint, ankle and foot 04/29/2013  . Benign hypertensive heart disease without heart failure 10/15/2012  . STEMI (ST elevation myocardial infarction) 05/30/2012  . Dyspnea 11/15/2011  . Herpes zoster 07/17/2011  . Fatigue 03/10/2011  . Chest pain at rest 03/10/2011  . Acute sinusitis, unspecified 03/10/2011  . Neurofibromatosis   . Type II or unspecified type diabetes mellitus without mention of complication, uncontrolled   . HTN (hypertension)   . Obesity   . Osteoarthritis, knee     Past Medical History:  Past Medical History  Diagnosis Date  . Neurofibromatosis   . Diabetes mellitus   . HTN (hypertension)   . Obesity   . Osteoarthritis, knee   . Hyperlipidemia     statin intolerant. LDL is 83  . UTI (lower urinary tract infection) 05/29/12  . 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 Pulmonary embolism [id number]   Past Surgical History:  Past Surgical History  Procedure Laterality Date  . Cardiac catheterization    .  Tonsillectomy and adenoidectomy    . Clavicle surgery    . Abdominal hysterectomy    . Ankle fracture surgery Right   . Total knee arthroplasty Left 09/15/2013    Procedure: TOTAL KNEE ARTHROPLASTY;  Surgeon: Vickey Huger, MD;  Location: [place];  Service: Orthopedics;  Laterality: Left;    Assessment & Plan Clinical Impression:  [name] is a 68 y.o. female with history of DM, neurofibromatosis, PE-chronic coumadin; who was admitted on 03/24/14 with hemoptysis, hematuria as well as severe back pain. Patient with bilateral infiltrates with supratherapeutic INR- 6.0 as well as drop in Hgb from 10.2 to 7.4. CTA negative for PE. She was started on IV antibiotics for HCAP coverage and coumadin reversed with 4 units FFP. She was transfused with 2 units PRBC and Dr. Nelda Marseille recommended discontinuing anti-coagulation until hemoptysis resolves. Lumbar spine X-rays with L1 endplate compression fracture and NM bone scan with mottled appearance of bony structures with question of myeloma. Hypercalcemia treated with Zometa as well as calcitonin. Dr. Jana Hakim consulted for input and recommended bone marrow biopsy. 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 PT evaluation limited due to back pain and corset ordered for support?. Rehab team recommending CIR and patient admitted today. Patient transferred to CIR on 03/31/2014 .   Patient currently requires min with mobility secondary to muscle weakness, decreased cardiorespiratoy endurance and decreased standing balance and decreased balance strategies.  Prior to hospitalization, patient was independent with intermittent use of cane when low back pain increased with mobility and lived with Spouse in a  House home.  Home access is 2Ramped entrance;Stairs to enter.  Patient will benefit from skilled PT intervention to maximize safe functional mobility, minimize fall risk and decrease caregiver burden for planned discharge home with 24 hour assist,  however, anticipate patient to discharge at mod I level.  Anticipate patient will benefit from follow up [place] at discharge.  PT - End of Session Activity Tolerance: Tolerates 30+ min activity with multiple rests Endurance Deficit: Yes Endurance Deficit Description: fatigues quickly, requires frequent rest breaks PT Assessment Rehab Potential: Good PT Patient demonstrates impairments in the following area(s): Balance;Endurance;Motor;Pain;Safety;Skin Integrity PT Transfers Functional Problem(s): Bed Mobility;Bed to Chair;Car;Furniture PT Locomotion Functional Problem(s): Ambulation;Wheelchair Mobility;Stairs PT Plan PT Intensity: Minimum of 1-2 x/day ,45 to 90 minutes PT Frequency: 5 out of 7 days PT Duration Estimated Length of Stay: 7-9 days PT Treatment/Interventions: Ambulation/gait training;Balance/vestibular training;Discharge planning;Disease management/prevention;Community reintegration;DME/adaptive equipment instruction;Functional mobility training;Patient/family education;Pain management;Neuromuscular re-education;Psychosocial support;Skin care/wound management;Splinting/orthotics;Therapeutic Exercise;Therapeutic Activities;Stair training;UE/LE Strength taining/ROM;UE/LE Coordination activities;Wheelchair propulsion/positioning PT Transfers Anticipated Outcome(s): mod I PT Locomotion Anticipated Outcome(s): mod I household ambulation, S community ambulation; w/c for [place] distances PT Recommendation Recommendations for Other Services:  (none) Follow Up Recommendations: Home health PT Patient destination: Home Equipment Recommended: None recommended by PT Equipment Details: Owns RW, hospital bed, wheelchair, shower chair, and [facility]  Skilled Therapeutic Intervention Skilled therapeutic intervention initiated after completion of evaluation. Added Jay-2 cushion to wheelchair for improved pressure relief and increased comfort. Discussed/education provided about pressure relief while  sitting in wheelchair via lateral leans and wheelchair push-ups for unweighting. Discussed falls risk, safety within room, and focus of therapy during stay. Discussed possible LOS, goals, and f/u therapy.  PT Evaluation Precautions/Restrictions Precautions Precautions: Back;Fall Required Braces or Orthoses: Other Brace/Splint Other Brace/Splint: corset for comfort only Restrictions Weight Bearing Restrictions: No General Chart Reviewed: Yes Amount of Missed PT Time (min): 14 Minutes Missed Time Reason: Pain Family/Caregiver Present: Yes (sister (who is deaf))  Vital Signs Therapy Vitals Pulse Rate: 94 Patient Position, if appropriate: Sitting Oxygen Therapy SpO2: 94 % O2 Device: None (Room air) Pain Pain Assessment Pain Assessment: 0-10 Pain Score: 4  Pain Type: Acute pain Pain Location: Back Pain Orientation: Lower Pain Descriptors / Indicators: Aching;Sore Pain Onset: On-going Pain Intervention(s): RN made aware;Repositioned;Ambulation/increased activity Multiple Pain Sites: No Home Living/Prior Functioning Home Living Available Help at Discharge: Family;Available 24 hours/day Type of Home: House Home Access: Ramped entrance;Stairs to enter Entrance Stairs-Number of Steps: 2 Entrance Stairs-Rails: Right Home Layout: One level  Lives With: Spouse Prior Function Level of Independence: Requires assistive device for independence  Able to Take Stairs?: Yes Driving: Yes Vocation: Retired Leisure: Hobbies-yes (Comment) Comments: gardening and loves to cook Vision/Perception    Wears Glasses No visual deficits reported Cognition Overall Cognitive Status: Within Functional Limits for tasks assessed Arousal/Alertness: Awake/alert Orientation Level: Oriented X4 Safety/Judgment: Appears intact Sensation Sensation Light Touch: Appears Intact Proprioception: Appears Intact Coordination Gross Motor Movements are Fluid and Coordinated: Yes Fine Motor Movements are  Fluid and Coordinated: Yes Motor  Motor Motor: Within Functional Limits  Mobility Bed Mobility Bed Mobility: Not assessed Transfers Transfers: Yes Sit to Stand: 4: Min assist;From chair/3-in-1;With armrests;With upper extremity assist Sit to Stand Details: Verbal cues for precautions/safety;Verbal cues for sequencing;Verbal cues for technique;Manual facilitation for weight shifting;Visual cues for safe use of DME/AE;Visual cues/gestures for sequencing Stand to Sit: 4: Min assist;With upper extremity assist;To chair/3-in-1;With armrests Stand to Sit Details (indicate cue type and reason): Verbal cues for sequencing;Manual facilitation for weight shifting;Visual  cues/gestures for sequencing;Visual cues for safe use of DME/AE;Verbal cues for precautions/safety;Verbal cues for technique Stand Pivot Transfers: 4: Min assist;With armrests Stand Pivot Transfer Details: Verbal cues for sequencing;Manual facilitation for weight shifting;Visual cues/gestures for sequencing;Visual cues for safe use of DME/AE;Verbal cues for precautions/safety;Verbal cues for technique Locomotion  Ambulation Ambulation: Yes Ambulation/Gait Assistance: 4: Min assist Ambulation Distance (Feet): 47 Feet Assistive device: Rolling walker Ambulation/Gait Assistance Details: Verbal cues for safe use of DME/AE;Verbal cues for gait pattern;Tactile cues for posture;Tactile cues for weight shifting;Verbal cues for precautions/safety Ambulation/Gait Assistance Details: Patient performed gait training 64' x1 in controlled environment with RW and minA. Gait Gait: Yes Gait Pattern: Impaired Gait Pattern: Step-to pattern;Step-through pattern;Decreased step length - right;Narrow base of support;Trunk flexed;Decreased stance time - left;Decreased trunk rotation Stairs / Additional Locomotion Stairs: Yes Stairs Assistance: 4: Min assist Stairs Assistance Details: Visual cues/gestures for sequencing;Verbal cues for sequencing;Verbal  cues for technique;Verbal cues for precautions/safety Stair Management Technique: Two rails;Step to pattern;Forwards Number of Stairs: 3 Height of Stairs: 5 Wheelchair Mobility Wheelchair Mobility: Yes Wheelchair Assistance: 4: Advertising account executive Details: Verbal cues for precautions/safety;Visual cues/gestures for sequencing;Verbal cues for sequencing;Verbal cues for technique Wheelchair Propulsion: Both upper extremities Wheelchair Parts Management: Needs assistance Distance: 60  Trunk/Postural Assessment  Cervical Assessment Cervical Assessment: Exceptions to [facility] (forward head posture) Thoracic Assessment Thoracic Assessment: Exceptions to [employer] (kyphotic posture) Lumbar Assessment Lumbar Assessment: Within Functional Limits Postural Control Postural Control: Within Functional Limits  Balance Balance Balance Assessed: Yes Static Sitting Balance Static Sitting - Balance Support: Bilateral upper extremity supported;No upper extremity supported;Feet supported Static Sitting - Level of Assistance: 6: Modified independent (Device/Increase time) Static Standing Balance Static Standing - Balance Support: Bilateral upper extremity supported;During functional activity Static Standing - Level of Assistance: 5: Stand by assistance Static Standing - Comment/# of Minutes: approx 25" to replace w/c cushion Extremity Assessment  RLE Assessment RLE Assessment: Within Functional Limits (Grossly 3+/5) LLE Assessment LLE Assessment: Within Functional Limits (Grossly 3+/5)  FIM:  FIM - Control and instrumentation engineer Devices: Walker;Arm rests Bed/Chair Transfer: 4: Bed > Chair or W/C: Min A (steadying Pt. > 75%);4: Chair or W/C > Bed: Min A (steadying Pt. > 75%) FIM - Locomotion: Wheelchair Distance: 60 Locomotion: Wheelchair: 2: Travels 50 - 149 ft with minimal assistance (Pt.>75%) FIM - Locomotion: Ambulation Locomotion: Ambulation Assistive Devices: Astronomer Ambulation/Gait Assistance: 4: Min assist Locomotion: Ambulation: 1: Travels less than 50 ft with minimal assistance (Pt.>75%) FIM - Locomotion: Stairs Locomotion: Scientist, physiological: Hand rail - 2 Locomotion: Stairs: 1: Up and Down < 4 stairs with minimal assistance (Pt.>75%)   Refer to Care Plan for Long Term Goals  Recommendations for other services: None  Discharge Criteria: Patient will be discharged from PT if patient refuses treatment 3 consecutive times without medical reason, if treatment goals not met, if there is a change in medical status, if patient makes no progress towards goals or if patient is discharged from hospital.  The above assessment, treatment plan, treatment alternatives and goals were discussed and mutually agreed upon: by patient  Lillia Abed. Aneisha Skyles, PT, DPT 04/01/2014, 11:41 AM

## 2014-04-01 NOTE — Discharge Summary (Signed)
Family Medicine Teaching Service  Discharge Note : Attending Jeff Braylyn Kalter MD Pager [phone] Inpatient Team Pager:  [phone]  I have reviewed this patient and the patient's chart and have discussed discharge planning with the resident at the time of discharge. I agree with the discharge plan as above.    

## 2014-04-01 NOTE — Telephone Encounter (Signed)
lvm for pt regarding to April appt..mailed pt appt sched/avs and letter.......called [facility] to advise pt on appt nure said line was busy

## 2014-04-02 ENCOUNTER — Inpatient Hospital Stay (HOSPITAL_COMMUNITY): Payer: Medicare Other | Admitting: Occupational Therapy

## 2014-04-02 ENCOUNTER — Inpatient Hospital Stay (HOSPITAL_COMMUNITY): Payer: Medicare Other

## 2014-04-02 ENCOUNTER — Encounter (HOSPITAL_COMMUNITY): Payer: Medicare Other | Admitting: Occupational Therapy

## 2014-04-02 ENCOUNTER — Inpatient Hospital Stay (HOSPITAL_COMMUNITY): Payer: Medicare Other | Admitting: *Deleted

## 2014-04-02 ENCOUNTER — Telehealth: Payer: Self-pay

## 2014-04-02 LAB — CBC
HCT: 24 % — ABNORMAL LOW (ref 36.0–46.0)
HEMOGLOBIN: 7.5 g/dL — AB (ref 12.0–15.0)
MCH: 26.2 pg (ref 26.0–34.0)
MCHC: 31.3 g/dL (ref 30.0–36.0)
MCV: 83.9 fL (ref 78.0–100.0)
PLATELETS: 150 10*3/uL (ref 150–400)
RBC: 2.86 MIL/uL — ABNORMAL LOW (ref 3.87–5.11)
RDW: 20.7 % — ABNORMAL HIGH (ref 11.5–15.5)
WBC: 13.5 10*3/uL — ABNORMAL HIGH (ref 4.0–10.5)

## 2014-04-02 LAB — GLUCOSE, CAPILLARY
GLUCOSE-CAPILLARY: 132 mg/dL — AB (ref 70–99)
Glucose-Capillary: 101 mg/dL — ABNORMAL HIGH (ref 70–99)
Glucose-Capillary: 129 mg/dL — ABNORMAL HIGH (ref 70–99)
Glucose-Capillary: 137 mg/dL — ABNORMAL HIGH (ref 70–99)
Glucose-Capillary: 177 mg/dL — ABNORMAL HIGH (ref 70–99)

## 2014-04-02 MED ORDER — TRAMADOL HCL 50 MG PO TABS
50.0000 mg | ORAL_TABLET | Freq: Every day | ORAL | Status: DC
  Administered 2014-04-02 – 2014-04-07 (×6): 50 mg via ORAL
  Filled 2014-04-02 (×6): qty 1

## 2014-04-02 NOTE — Progress Notes (Signed)
Recreational Therapy Assessment and Plan  Patient Details  Name: [name] MRN: [id number] Date of Birth: 05/25/1946 Today's Date: 04/02/2014  Rehab Potential: Good ELOS: 2 weeks   Assessment Clinical Impression: Problem List:  Patient Active Problem List    Diagnosis  Date Noted   .  Physical deconditioning  03/31/2014   .  Hypercalcemia  03/25/2014   .  Major hemoptysis  03/25/2014   .  Acute blood loss anemia  03/25/2014   .  Vertebral fracture, closed  03/25/2014   .  Hemoptysis  03/24/2014   .  Pneumonia, organism unspecified  03/24/2014   .  Encounter for therapeutic drug monitoring  01/14/2014   .  Fe deficiency anemia  12/31/2013   .  Long term (current) use of anticoagulants  09/24/2013   .  Pulmonary embolism  09/18/2013   .  Acute pulmonary embolism  09/17/2013   .  Hypoxemia  09/17/2013   .  Onychomycosis  04/29/2013   .  Pain in joint, ankle and foot  04/29/2013   .  Benign hypertensive heart disease without heart failure  10/15/2012   .  STEMI (ST elevation myocardial infarction)  05/30/2012   .  Dyspnea  11/15/2011   .  Herpes zoster  07/17/2011   .  Fatigue  03/10/2011   .  Chest pain at rest  03/10/2011   .  Acute sinusitis, unspecified  03/10/2011   .  Neurofibromatosis    .  Type II or unspecified type diabetes mellitus without mention of complication, uncontrolled    .  HTN (hypertension)    .  Obesity    .  Osteoarthritis, knee     Past Medical History:  Past Medical History   Diagnosis  Date   .  Neurofibromatosis    .  Diabetes mellitus    .  HTN (hypertension)    .  Obesity    .  Osteoarthritis, knee    .  Hyperlipidemia      statin intolerant. LDL is 83   .  UTI (lower urinary tract infection)  05/29/12   .  STEMI (ST elevation myocardial infarction)      Inferolateral STEMI 05/29/12 s/p DES to RCA, nl EF   .  Pulmonary embolism  [id number]    Past Surgical History:  Past Surgical History   Procedure  Laterality  Date   .  Cardiac  catheterization     .  Tonsillectomy and adenoidectomy     .  Clavicle surgery     .  Abdominal hysterectomy     .  Ankle fracture surgery  Right    .  Total knee arthroplasty  Left  09/15/2013     Procedure: TOTAL KNEE ARTHROPLASTY; Surgeon: Vickey Huger, MD; Location: [place]; Service: Orthopedics; Laterality: Left;    Assessment & Plan  Clinical Impression: [name] is a 68 y.o. female with history of DM, neurofibromatosis, PE-chronic coumadin; who was admitted on 03/24/14 with hemoptysis, hematuria as well as severe back pain. Patient with bilateral infiltrates with supratherapeutic INR- 6.0 as well as drop in Hgb from 10.2 to 7.4. CTA negative for PE. She was started on IV antibiotics for HCAP coverage and coumadin reversed with 4 units FFP. She was transfused with 2 units PRBC and Dr. Nelda Marseille recommended discontinuing anti-coagulation until hemoptysis resolves. Lumbar spine X-rays with L1 endplate compression fracture and NM bone scan with mottled appearance of bony structures with question of myeloma. Hypercalcemia  treated with Zometa as well as calcitonin. Dr. Jana Hakim consulted for input and recommended bone marrow biopsy. 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 PT evaluation limited due to back pain and corset ordered for support?. Rehab team recommending CIR and patient admitted today. Patient transferred to CIR on 03/31/2014.   Pt presents with decreased activity tolerance, decreased functional mobility, decreased balance, increased pain Limiting pt's independence with leisure/community pursuits.   Leisure History/Participation Premorbid leisure interest/current participation: [place];Petra [place] - Flower gardening;Community - Psychologist, forensic Other Leisure Interests: Television;Movies;Reading Leisure Participation Style: Alone;With Family/Friends Awareness of Community Resources: Good-identify 3 post discharge leisure  resources Psychosocial / Spiritual Spiritual Interests: Church Does patient have pets?: Yes Social interaction - Mood/Behavior: Cooperative Academic librarian Appropriate for Education?: Yes Recreational Therapy Orientation Orientation -Reviewed with patient: Available activity resources Strengths/Weaknesses Patient Strengths/Abilities: Willingness to participate;Active premorbidly Patient weaknesses: Physical limitations TR Patient demonstrates impairments in the following area(s): Endurance;Motor;Pain;Safety TR Additional Impairment(s): None  Plan Rec Therapy Plan Is patient appropriate for Therapeutic Recreation?: Yes Rehab Potential: Good Treatment times per week: Min 1 time per week >20 minutes Estimated Length of Stay: 2 weeks TR Treatment/Interventions: Adaptive equipment instruction;1:1 session;Balance/vestibular training;Functional mobility training;Community reintegration;Patient/family education;Therapeutic activities;Recreation/leisure participation;Therapeutic exercise;UE/LE Coordination activities;Wheelchair propulsion/positioning Recommendations for other services: Neuropsych  Recommendations for other services: Neuropsych  Discharge Criteria: Patient will be discharged from TR if patient refuses treatment 3 consecutive times without medical reason.  If treatment goals not met, if there is a change in medical status, if patient makes no progress towards goals or if patient is discharged from hospital.  The above assessment, treatment plan, treatment alternatives and goals were discussed and mutually agreed upon: by patient  Waldon Reining 04/02/2014, 2:24 PM

## 2014-04-02 NOTE — IPOC Note (Signed)
Overall Plan of Care [facility]) Patient Details Name: [name] MRN: [id number] DOB: 06-Feb-1946  Admitting Diagnosis: deconditioned with l1 comp fx  Hospital Problems: Active Problems:   Physical deconditioning     Functional Problem List: Nursing Behavior;Nutrition;Pain;Bowel;Bladder;Safety;Medication Management;Skin Integrity;Edema  PT Balance;Endurance;Motor;Pain;Safety;Skin Integrity  OT Balance;Endurance;Safety;Pain  SLP    TR Endurance;Motor;Pain;Safety       Basic ADL's: OT Eating;Grooming;Bathing;Dressing;Toileting     Advanced  ADL's: OT       Transfers: PT Bed Mobility;Bed to Chair;Car;Furniture  OT Toilet;Tub/Shower     Locomotion: PT Ambulation;Wheelchair Mobility;Stairs     Additional Impairments: OT None  SLP        TR None    Anticipated Outcomes Item Anticipated Outcome  Self Feeding independent  Swallowing      Basic self-care  supervision  Toileting  supervision   Bathroom Transfers supervision  Bowel/Bladder  cont of bowel and bladder  Transfers  mod I  Locomotion  mod I household ambulation, S community ambulation; w/c for [place] distances  Communication     Cognition     Pain  less than 2  Safety/Judgment  adhere tosafety protocol in place   Therapy Plan: PT Intensity: Minimum of 1-2 x/day ,45 to 90 minutes PT Frequency: 5 out of 7 days PT Duration Estimated Length of Stay: 7-9 days OT Intensity: Minimum of 1-2 x/day, 45 to 90 minutes OT Frequency: 5 out of 7 days OT Duration/Estimated Length of Stay: 7-10 days         Team Interventions: Nursing Interventions Bowel Management;Bladder Management;Pain Management;Patient/Family Education;Disease Management/Prevention;Skin Care/Wound Management;Discharge Planning;Psychosocial Support;Medication Management  PT interventions Ambulation/gait training;Balance/vestibular training;Discharge planning;Disease management/prevention;Community reintegration;DME/adaptive  equipment instruction;Functional mobility training;Patient/family education;Pain management;Neuromuscular re-education;Psychosocial support;Skin care/wound management;Splinting/orthotics;Therapeutic Exercise;Therapeutic Activities;Stair training;UE/LE Strength taining/ROM;UE/LE Coordination activities;Wheelchair propulsion/positioning  OT Interventions Balance/vestibular training;Community reintegration;Discharge planning;DME/adaptive equipment instruction;Functional mobility training;Pain management;Patient/family education;Therapeutic Activities;Therapeutic Exercise;UE/LE Strength taining/ROM;UE/LE Coordination activities  SLP Interventions    TR Interventions Adaptive equipment instruction;1:1 session;Balance/vestibular training;Functional mobility training;Community reintegration;Patient/family education;Therapeutic activities;Recreation/leisure participation;Therapeutic exercise;UE/LE Coordination activities;Wheelchair propulsion/positioning  SW/CM Interventions Discharge Planning;Psychosocial Support;Patient/Family Education    Team Discharge Planning: Destination: PT-Home ,OT- Home , SLP-  Projected Follow-up: PT-Home health PT, OT-   , SLP-  Projected Equipment Needs: PT-None recommended by PT, OT- None recommended by OT, SLP-  Equipment Details: PT-Owns RW, hospital bed, wheelchair, shower chair, and BSC, OT-has all necessary DME Patient/family involved in discharge planning: PT- Patient,  OT-Patient;Family member/caregiver, SLP-   MD ELOS: 7-10 days Medical Rehab Prognosis:  Excellent Assessment: The patient has been admitted for CIR therapies. The team will be addressing functional mobility, strength, stamina, balance, safety, adaptive techniques and equipment, self-care, bowel and bladder mgt, patient and caregiver education, pain mgt, back precautions, ego-support, cancer counseling. Goals have been set at mod I to supervision with mobility and self-care.    Meredith Staggers, MD,  FAAPMR      See Team Conference Notes for weekly updates to the plan of care

## 2014-04-02 NOTE — Progress Notes (Signed)
Physical Therapy Session Note  Patient Details  Name: [name] MRN: [id number] Date of Birth: [birth year]  Today's Date: 04/02/2014 Time: 1400-1500 Time Calculation (min): 60 min  Short Term Goals: Week 1:  PT Short Term Goal 1 (Week 1): STGs=LTGs due to ELOS  Skilled Therapeutic Interventions/Progress Updates:    Patient received sitting in recliner. Session focused on increasing activity tolerance through functional mobility and functional strengthening. Wheelchair mobility 120' x1 with B UE for increased activity tolerance, requires supervision/verbal cues for sequencing and technique. Gait training in controlled environment 34' x1 with RW and minA for lateral weight shifts. Patient demonstrates slow pace and guarded gait due to back pain.  Bed mobility training (with wedge per patient reports that's what she uses at home), repeated sit<>supine with emphasis on proper log rolling technique to decrease pain and maintain back precautions. NuStep Level 2 with B LE only x7' for increased activity tolerance and LE strengthening. Patient returned to room and left supine in bed with all needs within reach.  Therapy Documentation Precautions:  Precautions Precautions: Back;Fall Required Braces or Orthoses: Other Brace/Splint Other Brace/Splint: corset Restrictions Weight Bearing Restrictions: No Pain: Pain Assessment Pain Assessment: 0-10 Pain Score: 2  Pain Type: Acute pain Pain Location: Back Pain Orientation: Lower Pain Descriptors / Indicators: Sharp Pain Onset: With Activity Pain Intervention(s): RN made aware;Repositioned;Ambulation/increased activity Multiple Pain Sites: No Locomotion : Ambulation Ambulation/Gait Assistance: 4: Min assist Wheelchair Mobility Distance: 120   See FIM for current functional status  Therapy/Group: Individual Therapy  Lillia Abed. Arlin Sass, PT, DPT 04/02/2014, 4:18 PM

## 2014-04-02 NOTE — Telephone Encounter (Signed)
Dr Everlene Farrier would like to talk to Dr Naaman Plummer regarding this patient.  Dr Everlene Farrier can be reached at [phone].  Unsure of reason for call.

## 2014-04-02 NOTE — Progress Notes (Signed)
[facility]patient Rehabilitation Center Individual Statement of Services  Patient Name:  [name]  Date:  04/02/2014  Welcome to the [facility].  Our goal is to provide you with an individualized program based on your diagnosis and situation, designed to meet your specific needs.  With this comprehensive rehabilitation program, you will be expected to participate in at least 3 hours of rehabilitation therapies Monday-Friday, with modified therapy programming on the weekends.  Your rehabilitation program will include the following services:  Physical Therapy (PT), Occupational Therapy (OT), 24 hour per day rehabilitation nursing, Therapeutic Recreaction (TR), Case Management (Social Worker), Rehabilitation Medicine, Nutrition Services and Pharmacy Services  Weekly team conferences will be held on Tuesdays to discuss your progress.  Your Social Worker will talk with you frequently to get your input and to update you on team discussions.  Team conferences with you and your family in attendance may also be held.  Expected length of stay: 7-9 days  Overall anticipated outcome: supervision/ mod indenpendent  Depending on your progress and recovery, your program may change. Your Social Worker will coordinate services and will keep you informed of any changes. Your Social Worker's name and contact numbers are listed  below.  The following services may also be recommended but are not provided by the [facility] will be made to provide these services after discharge if needed.  Arrangements include referral to agencies that provide these services.  Your insurance has been verified to be:  Medicare and [employer] Your primary doctor is:  Dr. Everlene Farrier  Pertinent information will be shared with your doctor and your insurance company.  Social  Worker:  Lennart Pall, [place] or ([phone]   Information discussed with and copy given to patient by: Lennart Pall, 04/02/2014, 11:39 AM

## 2014-04-02 NOTE — Progress Notes (Signed)
[facility] PHYSICAL MEDICINE & REHABILITATION     PROGRESS NOTE    Subjective/Complaints: Low back tender when she gets out of bed. Brace doesn't really help, sometimes makes it hurt more. A 12 point review of systems has been performed and if not noted above is otherwise negative.   Objective: Vital Signs: Blood pressure 140/81, pulse 109, temperature 98.6 F (37 C), temperature source Oral, resp. rate 18, weight 81 kg (178 lb 9.2 oz), SpO2 94.00%. No results found.  Recent Labs  04/01/14 0540 04/02/14 0435  WBC 10.6* 13.5*  HGB 7.4* 7.5*  HCT 23.5* 24.0*  PLT 141* 150    Recent Labs  03/31/14 0457 04/01/14 0540  NA 137 138  K 4.2 4.1  CL 96 96  GLUCOSE 136* 102*  BUN 27* 25*  CREATININE 1.28* 1.23*  CALCIUM 8.1* 8.0*   CBG (last 3)   Recent Labs  04/01/14 1634 04/01/14 2127 04/02/14 0658  GLUCAP 121* 90 129*    Wt Readings from Last 3 Encounters:  04/02/14 81 kg (178 lb 9.2 oz)  03/31/14 82.146 kg (181 lb 1.6 oz)  03/21/14 86.183 kg (190 lb)    Physical Exam:  Nursing note and vitals reviewed.  Constitutional: She is oriented to person, place, and time. She appears well-developed and well-nourished. Nasal cannula in place. No distress  HENT: oral mucosa pink and moist  Head: Normocephalic and atraumatic.  Eyes: Conjunctivae are normal. Pupils are equal, round, and reactive to light.  Neck: Normal range of motion. Neck supple.  Cardiovascular: Normal rate and regular rhythm. No murmurs, rubs, or gallops  Respiratory: Effort normal and breath sounds normal. No respiratory distress. She has no wheezes.  GI: Soft. Bowel sounds are normal. She exhibits no distension. There is no tenderness.  Musculoskeletal: She exhibits no edema and no tenderness.  Mid-low back tender to general palpation palpation and with proximal LE and pelvic movements  Neurological: She is alert and oriented to person, place, and time.  UE 4/5 deltoid, bicep, tricep, HI.  Bilateral LE: 3/5 HF 4ke and 4/5 at ankles. No sensory loss in any of limbs. DTR's 1+. Reasonable insight and awareness. Good attention, memory functional  Skin: Skin is warm and dry.  Psychiatric:    appropriate    Assessment/Plan: 1. Functional deficits secondary to deconditioning related to multiple myeloma as well as L1 compression fx which require 3+ hours per day of interdisciplinary therapy in a comprehensive inpatient rehab setting. Physiatrist is providing close team supervision and 24 hour management of active medical problems listed below. Physiatrist and rehab team continue to assess barriers to discharge/monitor patient progress toward functional and medical goals. FIM: FIM - Bathing Bathing Steps Patient Completed: Chest;Right Arm;Left Arm;Abdomen;Front perineal area;Right upper leg;Left upper leg;Right lower leg (including foot);Left lower leg (including foot) Bathing: 4: Min-Patient completes 8-9 [id number] parts or 75+ percent (supine)  FIM - Upper Body Dressing/Undressing Upper body dressing/undressing steps patient completed: Thread/unthread right sleeve of pullover shirt/dresss;Thread/unthread left sleeve of pullover shirt/dress;Put head through opening of pull over shirt/dress;Pull shirt over trunk Upper body dressing/undressing: 3: Mod-Patient completed 50-74% of tasks FIM - Lower Body Dressing/Undressing Lower body dressing/undressing steps patient completed: Thread/unthread right underwear leg;Thread/unthread left underwear leg;Pull underwear up/down;Thread/unthread right pants leg;Thread/unthread left pants leg;Pull pants up/down;Don/Doff right sock;Don/Doff left sock;Don/Doff right shoe;Don/Doff left shoe Lower body dressing/undressing: 3: Mod-Patient completed 50-74% of tasks (thread bil LE into clothing supine (A) to pull up in standin)        FIM -  Bed/Chair Financial planner Devices: Copy: 3: Supine > Sit: Mod A (lifting  assist/Pt. 50-74%/lift 2 legs;3: Bed > Chair or W/C: Mod A (lift or lower assist);3: Chair or W/C > Bed: Mod A (lift or lower assist)  FIM - Locomotion: Wheelchair Distance: 60 Locomotion: Wheelchair: 2: Travels 50 - 149 ft with minimal assistance (Pt.>75%) FIM - Locomotion: Ambulation Locomotion: Ambulation Assistive Devices: Administrator Ambulation/Gait Assistance: 4: Min assist Locomotion: Ambulation: 1: Travels less than 50 ft with minimal assistance (Pt.>75%)  Comprehension Comprehension Mode: Auditory Comprehension: 3-Understands basic 50 - 74% of the time/requires cueing 25 - 50%  of the time  Expression Expression Mode: Verbal Expression: 2-Expresses basic 25 - 49% of the time/requires cueing 50 - 75% of the time. Uses single words/gestures.     Problem Solving Problem Solving: 2-Solves basic 25 - 49% of the time - needs direction more than half the time to initiate, plan or complete simple activities  Memory Memory: 2-Recognizes or recalls 25 - 49% of the time/requires cueing 51 - 75% of the time  Medical Problem List and Plan:  Deconditioning related to multiple medical issues including multiple myeloma, L1 compression fx  1. DVT Prophylaxis/Anticoagulation: Mechanical: Antiembolism stockings, knee (TED hose) Bilateral lower extremities  Sequential compression devices, below knee Bilateral lower extremities. Continues with hemoptysis.  2. Back Pain/Pain Management: D/C oxycodone due to nausea and hallucinations. Reports tylenol and Sportscreme was effective today.   ultram prn for more severe pain--will schedule an AM dose  . LSO for support and pain management.--want this on when she's up  3. Mood: LCSW to follow for evaluation and support. Mood better today.  4. Neuropsych: This patient is capable of making decisions on her own behalf.  5. B-CAP: Antibiotic D# 9/ 10-14?Marland Kitchen Changed to Levaquin. Continue to wean oxygen as tolerated.   -wean oxygen as posible 6. ABLA:  Monitor serial H/H. hgb 7.4---stool for ob 7. Multiple Myeloma: Follow up with Dr. Jana Hakim for treatment/discussion in about a week or so 8. Acute renal failure: Slowly improving after diuresis. Encourage fluids  9. DM type 2: resumed amaryl as po intake good. However had low sugar last night  -change amaryl to qd only for now  -. Discontinued metformin with AKI as well as (likely) MM diagnosis.     LOS (Days) 2 A FACE TO FACE EVALUATION WAS PERFORMED  Meredith Staggers 04/02/2014 8:09 AM

## 2014-04-02 NOTE — Progress Notes (Signed)
Occupational Therapy Session Note  Patient Details  Name: [name] MRN: [id number] Date of Birth: 1946/07/30  Today's Date: 04/02/2014 Time: 1194-1740 Time Calculation (min): 42 min  Short Term Goals: Week 1:  OT Short Term Goal 1 (Week 1): STG = LTG  Skilled Therapeutic Interventions/Progress Updates:    Engaged in functional mobility with RW and toilet transfers.  Pt on BSC upon arrival, discussed role of occupational therapy and priorities of pt while she attempted to toilet.  Pt with no success with voiding.  Sit > stand from [facility] with min/steady assist.  Pt unable to pull pants over hips due to back pain.  Ambulated 100' x2 to ADL apt.  Engaged in toilet transfer with RW after rest break.  Pt min/steady assist with all mobility.  Easily fatigued, discussed energy conservation strategies with self-care tasks and IADLs.  Therapy Documentation Precautions:  Precautions Precautions: Back;Fall Required Braces or Orthoses: Other Brace/Splint Other Brace/Splint: corset Restrictions Weight Bearing Restrictions: No Pain: Pain Assessment Pain Assessment: 0-10 Pain Score: 2   See FIM for current functional status  Therapy/Group: Individual Therapy  Holli Humbles [name] 04/02/2014, 1:50 PM

## 2014-04-02 NOTE — Progress Notes (Signed)
Occupational Therapy Session Note  Patient Details  Name: [name] MRN: [id number] Date of Birth: 1946/08/25  Today's Date: 04/02/2014 Time: 0830-0930 Time Calculation (min): 60 min  Short Term Goals: Week 1:  OT Short Term Goal 1 (Week 1): STG = LTG  Skilled Therapeutic Interventions/Progress Updates:    1:1 self care retraining, activity tolerance, static sitting: Pt supine on arrival and requesting bathing bed level today and attempt sink level next session. Pt void of bowel without awareness. Pt requesting bed pan to attempt void bowels. Pt educated on attempting to start using 3n1 to void. Pt voiding bowels (+) Pt completed supine <>sit with bed rail HOB flat Mod (A). Pt tolerating transfer better than previous session with much more controlled pain level. Pt sit<>stand mod (A). Pt total (A) to pull up pants. Pt transferring to w/c with min (A) stand pivot. Pt dressing UB in w/c. Pt able to turn and don bra without (A) this session. Pt sitting in w/c at sink for oral care. Pt ending session in w/c with spouse present. Pt asking to exit room with husband. Educated that patient is not allowed to leave nursing unit and must notify RN prior to leaving room into hallways in w/c.  Therapy Documentation Precautions:  Precautions Precautions: Back;Fall Required Braces or Orthoses: Other Brace/Splint Other Brace/Splint: corset Restrictions Weight Bearing Restrictions: No General:   Vital Signs: Therapy Vitals Temp: 98.6 F (37 C) Temp src: Oral Pulse Rate: 101 Resp: 18 BP: 138/78 mmHg Patient Position, if appropriate: Lying Oxygen Therapy SpO2: 94 % O2 Device: None (Room air) Pain:   ADL: ADL Youth worker: Shower seat with back ADL Comments: see FIM Exercises:   Other Treatments:    See FIM for current functional status  Therapy/Group: Individual Therapy  Peri Maris 04/02/2014, 8:52 AM

## 2014-04-02 NOTE — Progress Notes (Signed)
Social Work  Social Work Assessment and Plan  Patient Details  Name: [name] MRN: [id number] Date of Birth: 17-Dec-1946  Today's Date: 04/02/2014  Problem List:  Patient Active Problem List   Diagnosis Date Noted  . Physical deconditioning 03/31/2014  . Hypercalcemia 03/25/2014  . Major hemoptysis 03/25/2014  . Acute blood loss anemia 03/25/2014  . Vertebral fracture, closed 03/25/2014  . Hemoptysis 03/24/2014  . Pneumonia, organism unspecified 03/24/2014  . Encounter for therapeutic drug monitoring 01/14/2014  . Fe deficiency anemia 12/31/2013  . Long term (current) use of anticoagulants 09/24/2013  . Pulmonary embolism 09/18/2013  . Acute pulmonary embolism 09/17/2013  . Hypoxemia 09/17/2013  . Onychomycosis 04/29/2013  . Pain in joint, ankle and foot 04/29/2013  . Benign hypertensive heart disease without heart failure 10/15/2012  . STEMI (ST elevation myocardial infarction) 05/30/2012  . Dyspnea 11/15/2011  . Herpes zoster 07/17/2011  . Fatigue 03/10/2011  . Chest pain at rest 03/10/2011  . Acute sinusitis, unspecified 03/10/2011  . Neurofibromatosis   . Type II or unspecified type diabetes mellitus without mention of complication, uncontrolled   . HTN (hypertension)   . Obesity   . Osteoarthritis, knee    Past Medical History:  Past Medical History  Diagnosis Date  . Neurofibromatosis   . Diabetes mellitus   . HTN (hypertension)   . Obesity   . Osteoarthritis, knee   . Hyperlipidemia     statin intolerant. LDL is 83  . UTI (lower urinary tract infection) 05/29/12  . 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 Pulmonary embolism [id number]   Past Surgical History:  Past Surgical History  Procedure Laterality Date  . Cardiac catheterization    . Tonsillectomy and adenoidectomy    . Clavicle surgery    . Abdominal hysterectomy    . Ankle fracture surgery Right   . Total knee arthroplasty Left  09/15/2013    Procedure: TOTAL KNEE ARTHROPLASTY;  Surgeon: Vickey Huger, MD;  Location: [facility];  Service: Orthopedics;  Laterality: Left;   Social History:  reports that she has never smoked. She has never used smokeless tobacco. She reports that she does not drink alcohol or use illicit drugs.  Family / Support Systems Marital Status: Married Patient Roles: Spouse;Parent (Has a husband and 2 sons.) Spouse/Significant Other: Adalind Weitz @ (H) [phone] or ([phone] Children: son, Molli Barrows @ (C) [phone] and dtr-in-law, [place] @ (C) [phone];  son, Jeneen Rinks and dtr-in-law Jenetta Downer - both sons living locally.  Dtr-in-law, Jenetta Downer, is not working and plans to provide assist Anticipated Caregiver: Husband Ability/Limitations of Caregiver: no limitations at all per pt Caregiver Availability: 24/7 Family Dynamics: pt describes husband and family as very supportive and capable of providing any assistance needed  Social History Preferred language: English Religion: Baptist Cultural Background: NA Education: HS Read: Yes Write: Yes Employment Status: Retired Freight forwarder Issues: none Guardian/Conservator: none - per MD, pt capable of making decisions on her own behalf   Abuse/Neglect Physical Abuse: Denies Verbal Abuse: Denies Sexual Abuse: Denies Exploitation of patient/patient's resources: Denies Self-Neglect: Denies  Emotional Status Pt's affect, behavior adn adjustment status: pt very pleasant and motivated for therapies.  Denies any concerns about support at home.  Denies any emotional distress - will monitor. Recent Psychosocial Issues: None Pyschiatric History: None Substance Abuse History: None  Patient / Family Perceptions, Expectations & Goals Pt/Family understanding of illness & functional limitations: Pt and family  with good understanding of her L1 compression and current functional limitations.   Premorbid pt/family roles/activities: Pt was  independent overall with AD at home. Anticipated changes in roles/activities/participation: None - husband was assist as needed prn and provided assist after her TKR in Sept 2014 Pt/family expectations/goals: "I just want to get a little stronger"  [employer]: None Premorbid Home Care/DME Agencies: Other (Comment) [place] [facility]) Transportation available at discharge: yes  Discharge Planning Living Arrangements: Spouse/significant other Support Systems: Children;Spouse/significant other Type of Residence: Private residence Insurance Resources: Education officer, museum (specify) ([employer]) Museum/gallery curator Resources: [place] Referred: No Living Expenses: Own Money Management: Spouse Does the patient have any problems obtaining your medications?: No Home Management: pt and family share Patient/Family Preliminary Plans: pt plans to return home with her husband as primary support and other family prn Social Work Anticipated Follow Up Needs: HH/OP Expected length of stay: 7-10 days  Clinical Impression Very pleasant, motivated woman here following L1 fx.  Excellent family support and anticipate short LOS.  No emotional distress noted - will monitor.  Lennart Pall 04/02/2014, 11:37 AM

## 2014-04-02 NOTE — Progress Notes (Signed)
Physical Therapy Note  Patient Details  Name: [name] MRN: [id number] Date of Birth: 1946/02/13 Today's Date: 04/02/2014 1035-1100, 25 min individual therapy  Attempted to don back brace for comfort, due to pain rated 4/10 by pt (premedicated), but brace appears too small and was uncomfortable. Bil legrests of w/c raised for increased comfort.  Gait training x 50' x 2 with RW, min guard assist.  Sit> stand with VCs for safety, min guard assist.  W/c parts management- pt locked and unlocked R brake of w/c with tactile and VCs x 3; manipulated footrests using bil feet, with VCs.  W/c > recliner stand step transfer with Rw, min guard assist.  Pt left reclined in recliner; all needs in place and sister with pt.  Frederic Jericho 04/02/2014, 7:54 AM

## 2014-04-03 ENCOUNTER — Inpatient Hospital Stay (HOSPITAL_COMMUNITY): Payer: Medicare Other | Admitting: Occupational Therapy

## 2014-04-03 ENCOUNTER — Inpatient Hospital Stay (HOSPITAL_COMMUNITY): Payer: Medicare Other | Admitting: Physical Therapy

## 2014-04-03 ENCOUNTER — Inpatient Hospital Stay (HOSPITAL_COMMUNITY): Payer: Medicare Other

## 2014-04-03 DIAGNOSIS — C9000 Multiple myeloma not having achieved remission: Secondary | ICD-10-CM

## 2014-04-03 LAB — CBC
HCT: 24.4 % — ABNORMAL LOW (ref 36.0–46.0)
Hemoglobin: 7.6 g/dL — ABNORMAL LOW (ref 12.0–15.0)
MCH: 26.6 pg (ref 26.0–34.0)
MCHC: 31.1 g/dL (ref 30.0–36.0)
MCV: 85.3 fL (ref 78.0–100.0)
Platelets: 150 10*3/uL (ref 150–400)
RBC: 2.86 MIL/uL — ABNORMAL LOW (ref 3.87–5.11)
RDW: 21.9 % — ABNORMAL HIGH (ref 11.5–15.5)
WBC: 12.6 10*3/uL — ABNORMAL HIGH (ref 4.0–10.5)

## 2014-04-03 LAB — GLUCOSE, CAPILLARY
Glucose-Capillary: 138 mg/dL — ABNORMAL HIGH (ref 70–99)
Glucose-Capillary: 206 mg/dL — ABNORMAL HIGH (ref 70–99)
Glucose-Capillary: 136 mg/dL — ABNORMAL HIGH (ref 70–99)
Glucose-Capillary: 130 mg/dL — ABNORMAL HIGH (ref 70–99)

## 2014-04-03 NOTE — Progress Notes (Signed)
Occupational Therapy Session Note  Patient Details  Name: [name] MRN: [id number] Date of Birth: 04/30/1946  Today's Date: 04/03/2014 Time: 1130-1200 Time Calculation (min): 30 min  Short Term Goals: Week 1:  OT Short Term Goal 1 (Week 1): STG = LTG  Skilled Therapeutic Interventions/Progress Updates: ADL-retraining with focus on transfer training  to shower chair using shower frame simulation at ADL apartment.   During training patient reported plan to use her mother's older shower chair and cane to complete transfer.   Pt demonstrated method planned and was educated on benefit of using BSC to enhance safety and improve access to buttocks while performing seated bathing.   Patient completed transfer with standby assist and also verbalized plan to always have husband with her during bathing (supervision).   Patient was re-educated on use of LH sponge to improve reach and wound care protection during bathing.   Patient returned to room in w/c for noon meal with phone and call light within reach.    Therapy Documentation Precautions:  Precautions Precautions: Back;Fall Required Braces or Orthoses: Other Brace/Splint Other Brace/Splint: corset Restrictions Weight Bearing Restrictions: No  Pain: Pain Assessment Pain Assessment: 0-10 Pain Score: 3  Pain Type: Acute pain Pain Location: Back Pain Orientation: Lower Pain Descriptors / Indicators: Sharp Pain Onset: With Activity Pain Intervention(s): Repositioned;Ambulation/increased activity  ADL: ADL Youth worker: Shower seat with back ADL Comments: see FIM  See FIM for current functional status  Therapy/Group: Individual Therapy  Salome Spotted 04/03/2014, 1:01 PM

## 2014-04-03 NOTE — Progress Notes (Signed)
Met for approximately one hour with patient, husband, son Jenny Reichmann, daughter in law; gda. also present. Reviewed pathology from bone marrow biopsy, went over pathophysiology of myeloma, and discussed treatment options; recommended patient participate in our study randomly assigning patients to bortezomib vs carfilzomib; all patients receive dexamethasone and lenolidamaide. Patient is very interested in participating; I gave her the consent form to study; will try to get our study nurse to see the patient mon or tues, but if discharge is imminent please contact our office. She does have an appointment with me 4/24 at 3 PM and hopefully we can sign consent then and start therapy the following week.  Will follow with you.

## 2014-04-03 NOTE — Progress Notes (Signed)
Physical Therapy Session Note  Patient Details  Name: LILYANAH CELESTIN MRN: [id number] Date of Birth: 04-03-1946  Today's Date: 04/03/2014 Time: 1000-1055 and 1305-1350 Time Calculation (min): 55 min and 45 min  Short Term Goals: Week 1:  PT Short Term Goal 1 (Week 1): STGs=LTGs due to ELOS  Skilled Therapeutic Interventions/Progress Updates:   Session 1: Pt received sitting in recliner. Pt performed gait training room <> gym 150 ft x 2 using RW with close supervision-min A. For conditioning, pt propelled w/c 25' using BUEs and supervision. Pt negotiated up/down five 6" steps using B rails and min A using step-to pattern. Pt attempted side stepping using rail in hallway with sharp increase in low back pain. Pt performed NMR for static standing balance on hard surface with BUE>1 UE> no UE support and CGA. On compliant surface pt with BUE support progressed to 1 UE, with short trials <10 sec of no UE support and CGA-min A. Pt with posterior weightshift and decreased ankle strategy for balance reactions. Pt requested multiple seated rest breaks while performing NMR. Pt reported feeling clammy and feeling like sugar may be low - pt  provided ginger ale and reported improvement, able to ambulate back to room. Pt left sitting in recliner with all needs within reach.    Session 2: Pt received sitting in recliner, visiting with neighbor. Pt performed gait training room <> gym 150 ft x 2 using RW with supervision-min guard, decreased gait speed and guarded,cautious gait pattern. Pt performed bed mobility sit<>supine to left x 2, with mod multimodal cues for correct log rolling technique in order to decrease pain and maintain back precautions. Pt performed lateral stepping for glute med strengthening using RW to L and R 2 x 8 ft each with min A for weightshifting and mod verbal cues for sequencing. Pt ambulated 10 ft to NuStep for LE strengthening and activity tolerance using BLEs only x 10 min Level 3. Pt left  sitting in recliner with all needs within reach and home health agency rep present.    Therapy Documentation Precautions:  Precautions Precautions: Back;Fall Required Braces or Orthoses: Other Brace/Splint Other Brace/Splint: corset Restrictions Weight Bearing Restrictions: No Pain: Pain Assessment Pain Assessment: 0-10 Pain Score: 3  Pain Type: Acute pain Pain Location: Back Pain Orientation: Lower Pain Descriptors / Indicators: Sharp Pain Onset: With Activity Pain Intervention(s): Repositioned;Ambulation/increased activity Locomotion : Ambulation Ambulation/Gait Assistance: 4: Min assist;4: Min Building control surveyor Distance: 25   See FIM for current functional status  Therapy/Group: Individual Therapy  Laretta Alstrom 04/03/2014, 11:12 AM

## 2014-04-03 NOTE — Progress Notes (Signed)
[place] PHYSICAL MEDICINE & REHABILITATION     PROGRESS NOTE    Subjective/Complaints: Had a pretty good day yesterday. Pain is controlled for the most part. Still is motivated. A 12 point review of systems has been performed and if not noted above is otherwise negative.   Objective: Vital Signs: Blood pressure 154/88, pulse 102, temperature 97.8 F (36.6 C), temperature source Oral, resp. rate 17, weight 81 kg (178 lb 9.2 oz), SpO2 93.00%. No results found.  Recent Labs  04/01/14 0540 04/02/14 0435  WBC 10.6* 13.5*  HGB 7.4* 7.5*  HCT 23.5* 24.0*  PLT 141* 150    Recent Labs  04/01/14 0540  NA 138  K 4.1  CL 96  GLUCOSE 102*  BUN 25*  CREATININE 1.23*  CALCIUM 8.0*   CBG (last 3)   Recent Labs  04/02/14 1614 04/02/14 2124 04/02/14 2330  GLUCAP 132* 101* 137*    Wt Readings from Last 3 Encounters:  04/02/14 81 kg (178 lb 9.2 oz)  03/31/14 82.146 kg (181 lb 1.6 oz)  03/21/14 86.183 kg (190 lb)    Physical Exam:  Nursing note and vitals reviewed.  Constitutional: She is oriented to person, place, and time. She appears well-developed and well-nourished. Nasal cannula in place. No distress  HENT: oral mucosa pink and moist  Head: Normocephalic and atraumatic.  Eyes: Conjunctivae are normal. Pupils are equal, round, and reactive to light.  Neck: Normal range of motion. Neck supple.  Cardiovascular: Normal rate and regular rhythm. No murmurs, rubs, or gallops  Respiratory: Effort normal and breath sounds normal. No respiratory distress. She has no wheezes.  GI: Soft. Bowel sounds are normal. She exhibits no distension. There is no tenderness.  Musculoskeletal: She exhibits no edema and no tenderness.  Mid-low back tender to general palpation palpation and with proximal LE and pelvic movements  Neurological: She is alert and oriented to person, place, and time.  UE 4/5 deltoid, bicep, tricep, HI. Bilateral LE: 3+/5 HF 4ke and 4/5 at ankles. No sensory  loss in any of limbs. DTR's 1+. Reasonable insight and awareness. Good attention, memory functional  Skin: Skin is warm and dry.  Psychiatric:    appropriate    Assessment/Plan: 1. Functional deficits secondary to deconditioning related to multiple myeloma as well as L1 compression fx which require 3+ hours per day of interdisciplinary therapy in a comprehensive inpatient rehab setting. Physiatrist is providing close team supervision and 24 hour management of active medical problems listed below. Physiatrist and rehab team continue to assess barriers to discharge/monitor patient progress toward functional and medical goals.  Pt's PCP apparently called yesterday. WIll contact him when his office opens today.  FIM: FIM - Bathing Bathing Steps Patient Completed: Chest;Right Arm;Left Arm;Abdomen;Front perineal area;Right upper leg;Left upper leg;Right lower leg (including foot);Left lower leg (including foot) Bathing: 4: Min-Patient completes 8-9 75f 10 parts or 75+ percent (supine)  FIM - Upper Body Dressing/Undressing Upper body dressing/undressing steps patient completed: Thread/unthread right sleeve of pullover shirt/dresss;Thread/unthread left sleeve of pullover shirt/dress;Put head through opening of pull over shirt/dress;Pull shirt over trunk Upper body dressing/undressing: 3: Mod-Patient completed 50-74% of tasks FIM - Lower Body Dressing/Undressing Lower body dressing/undressing steps patient completed: Thread/unthread right underwear leg;Thread/unthread left underwear leg;Pull underwear up/down;Thread/unthread right pants leg;Thread/unthread left pants leg;Pull pants up/down;Don/Doff right sock;Don/Doff left sock;Don/Doff right shoe;Don/Doff left shoe Lower body dressing/undressing: 3: Mod-Patient completed 50-74% of tasks (thread bil LE into clothing supine (A) to pull up in standin)  FIM - Toileting  Toileting: 1: Total-Patient completed zero steps, helper did all 3  FIM - Glass blower/designer Devices: Nurse, learning disability Transfers: 4-To toilet/BSC: Min A (steadying Pt. > 75%);4-From toilet/BSC: Min A (steadying Pt. > 75%)  FIM - Control and instrumentation engineer Devices: Walker;Arm rests;HOB elevated Bed/Chair Transfer: 4: Bed > Chair or W/C: Min A (steadying Pt. > 75%);4: Chair or W/C > Bed: Min A (steadying Pt. > 75%);4: Supine > Sit: Min A (steadying Pt. > 75%/lift 1 leg);4: Sit > Supine: Min A (steadying pt. > 75%/lift 1 leg)  FIM - Locomotion: Wheelchair Distance: 120 Locomotion: Wheelchair: 2: Travels 8 - 149 ft with supervision, cueing or coaxing FIM - Locomotion: Ambulation Locomotion: Ambulation Assistive Devices: Administrator Ambulation/Gait Assistance: 4: Min assist Locomotion: Ambulation: 2: Travels 50 - 149 ft with minimal assistance (Pt.>75%)  Comprehension Comprehension Mode: Auditory Comprehension: 3-Understands basic 50 - 74% of the time/requires cueing 25 - 50%  of the time  Expression Expression Mode: Verbal Expression: 2-Expresses basic 25 - 49% of the time/requires cueing 50 - 75% of the time. Uses single words/gestures.     Problem Solving Problem Solving: 2-Solves basic 25 - 49% of the time - needs direction more than half the time to initiate, plan or complete simple activities  Memory Memory: 2-Recognizes or recalls 25 - 49% of the time/requires cueing 51 - 75% of the time  Medical Problem List and Plan:  Deconditioning related to multiple medical issues including multiple myeloma, L1 compression fx  1. DVT Prophylaxis/Anticoagulation: Mechanical: Antiembolism stockings, knee (TED hose) Bilateral lower extremities  Sequential compression devices, below knee Bilateral lower extremities. Continues with hemoptysis.  2. Back Pain/Pain Management: D/C oxycodone due to nausea and hallucinations. Reports tylenol and Sportscreme was effective today.   ultram prn for more severe  pain--will schedule an AM dose  -LSO for support and pain management.--want this on when she's up  3. Mood: LCSW to follow for evaluation and support. Mood better today.  4. Neuropsych: This patient is capable of making decisions on her own behalf.  5. B-CAP: Antibiotic D# 10/14?Marland Kitchen Currently on Levaquin--probably will continue through the weekend.   -wean oxygen as posible 6. ABLA: Monitor serial H/H. hgb 7.4---stool for ob 7. Multiple Myeloma: Follow up with Dr. Jana Hakim for treatment/discussion in about a week or so  -answered some basic question from patient and husband today 45. Acute renal failure: Slowly improving after diuresis. Encourage fluids  9. DM type 2: resumed amaryl as po intake good. However had low sugar last night  -changed amaryl to qd only which has helped the hypoglycemia  -Discontinued metformin with AKI as well as (likely) MM diagnosis.     LOS (Days) 3 A FACE TO FACE EVALUATION WAS PERFORMED  Meredith Staggers 04/03/2014 8:34 AM

## 2014-04-03 NOTE — Progress Notes (Signed)
Social Work Patient ID: [name], female   DOB: 09/16/1946, 68 y.o.   MRN: [id number] Daughter wants to switch home health agencies from [facility] to [facility].  Will contact and inform the switch

## 2014-04-03 NOTE — Progress Notes (Signed)
Occupational Therapy Session Note  Patient Details  Name: [name] MRN: [id number] Date of Birth: 1946-06-14  Today's Date: 04/03/2014 Time: 0800-0900 Time Calculation (min): 60 min  Short Term Goals: Week 1:  OT Short Term Goal 1 (Week 1): STG = LTG  Skilled Therapeutic Interventions/Progress Updates:  Patient resting in bed and finishing breakfast upon arrival with husband at her side.  Patient reports that she wants to be as independent as possible therefore she prefers to be in bed for sponge bath and LB dressing to include rolling and sits EOB or in w/c for UB dressing.  Patient requires assist with washing buttocks to avoid twisting if she is sidelying.  Reviewed other options and encouraged patient to consider these options for washing her buttocks.  Patient states that she wants to be independent however, often asks for help instead of trying first.  Patient seems to have difficult time progressing/moving on to next level since "I always do it this way".  Patient reports that she wants to wash her hair in the shower however reviewed with her that she will need to progress to sit and stand for LB bath in shower.  Patient declined to stand for combing hair at sink and chose to sit.  Therapy Documentation Precautions:  Precautions Precautions: Back;Fall Required Braces or Orthoses: Other Brace/Splint Other Brace/Splint: corset Restrictions Weight Bearing Restrictions: No Pain: Pain Assessment Pain Assessment: No/denies pain ADL: See FIM for current functional status  Therapy/Group: Individual Therapy  Gaye Pollack 04/03/2014, 9:19 AM

## 2014-04-04 ENCOUNTER — Inpatient Hospital Stay (HOSPITAL_COMMUNITY): Payer: Medicare Other | Admitting: *Deleted

## 2014-04-04 LAB — GLUCOSE, CAPILLARY
GLUCOSE-CAPILLARY: 121 mg/dL — AB (ref 70–99)
GLUCOSE-CAPILLARY: 136 mg/dL — AB (ref 70–99)
GLUCOSE-CAPILLARY: 102 mg/dL — AB (ref 70–99)
GLUCOSE-CAPILLARY: 118 mg/dL — AB (ref 70–99)

## 2014-04-04 LAB — CBC
HEMATOCRIT: 23.3 % — AB (ref 36.0–46.0)
HEMOGLOBIN: 7.3 g/dL — AB (ref 12.0–15.0)
MCH: 26.8 pg (ref 26.0–34.0)
MCHC: 31.3 g/dL (ref 30.0–36.0)
MCV: 85.7 fL (ref 78.0–100.0)
Platelets: 143 10*3/uL — ABNORMAL LOW (ref 150–400)
RBC: 2.72 MIL/uL — AB (ref 3.87–5.11)
RDW: 22.6 % — ABNORMAL HIGH (ref 11.5–15.5)
WBC: 11.7 10*3/uL — AB (ref 4.0–10.5)

## 2014-04-04 NOTE — Progress Notes (Signed)
Occupational Therapy Note  Patient Details  Name: [name] MRN: [id number] Date of Birth: 1946-07-17 Today's Date: 04/04/2014  Time:  1500-1530  (30 min) Pain:  none Individual session   Engaged in functional mobility in room and in hallway with emphasis on balance and safety.  Pt. Recalled 2/3 back precautions initially but after education was 3/3.  Placed in recliner at end of session with feet elevated and call bell in hand.    Lisa Roca 04/04/2014, 3:15 PM

## 2014-04-04 NOTE — Progress Notes (Signed)
[place] PHYSICAL MEDICINE & REHABILITATION     PROGRESS NOTE    Subjective/Complaints: No problems yesterday. Family met with Dr. Jana Hakim yesterday. Pain manageable A 12 point review of systems has been performed and if not noted above is otherwise negative.   Objective: Vital Signs: Blood pressure 121/80, pulse 99, temperature 98.4 F (36.9 C), temperature source Oral, resp. rate 18, weight 81 kg (178 lb 9.2 oz), SpO2 92.00%. No results found.  Recent Labs  04/03/14 0940 04/04/14 0540  WBC 12.6* 11.7*  HGB 7.6* 7.3*  HCT 24.4* 23.3*  PLT 150 143*   No results found for this basename: NA, K, CL, CO, GLUCOSE, BUN, CREATININE, CALCIUM,  in the last 72 hours CBG (last 3)   Recent Labs  04/03/14 1140 04/03/14 1630 04/03/14 2149  GLUCAP 206* 136* 130*    Wt Readings from Last 3 Encounters:  04/02/14 81 kg (178 lb 9.2 oz)  03/31/14 82.146 kg (181 lb 1.6 oz)  03/21/14 86.183 kg (190 lb)    Physical Exam:  Nursing note and vitals reviewed.  Constitutional: She is oriented to person, place, and time. She appears well-developed and well-nourished.  HENT: oral mucosa pink and moist  Head: Normocephalic and atraumatic.  Eyes: Conjunctivae are normal. Pupils are equal, round, and reactive to light.  Neck: Normal range of motion. Neck supple.  Cardiovascular: Normal rate and regular rhythm. No murmurs, rubs, or gallops  Respiratory: Effort normal and breath sounds normal. No respiratory distress. She has no wheezes.  GI: Soft. Bowel sounds are normal. She exhibits no distension. There is no tenderness.  Musculoskeletal: She exhibits no edema and no tenderness.  Mid-low back tender to general palpation palpation and with proximal LE and pelvic movements  Neurological: She is alert and oriented to person, place, and time.  UE 4/5 deltoid, bicep, tricep, HI. Bilateral LE: 3+/5 HF 4ke and 4/5 at ankles. No sensory loss in any of limbs. DTR's 1+. Reasonable insight and  awareness. Good attention, memory functional  Skin: Skin is warm and dry.  Psychiatric:    appropriate    Assessment/Plan: 1. Functional deficits secondary to deconditioning related to multiple myeloma as well as L1 compression fx which require 3+ hours per day of interdisciplinary therapy in a comprehensive inpatient rehab setting. Physiatrist is providing close team supervision and 24 hour management of active medical problems listed below. Physiatrist and rehab team continue to assess barriers to discharge/monitor patient progress toward functional and medical goals.     FIM: FIM - Bathing Bathing Steps Patient Completed: Chest;Right Arm;Left Arm;Abdomen;Front perineal area;Right upper leg;Left upper leg;Right lower leg (including foot);Left lower leg (including foot) Bathing: 4: Min-Patient completes 8-9 43f 10 parts or 75+ percent (supine)  FIM - Upper Body Dressing/Undressing Upper body dressing/undressing steps patient completed: Thread/unthread right sleeve of pullover shirt/dresss;Thread/unthread left sleeve of pullover shirt/dress;Put head through opening of pull over shirt/dress;Pull shirt over trunk;Thread/unthread right bra strap;Thread/unthread left bra strap Upper body dressing/undressing: 4: Min-Patient completed 75 plus % of tasks FIM - Lower Body Dressing/Undressing Lower body dressing/undressing steps patient completed: Thread/unthread right underwear leg;Thread/unthread left underwear leg;Thread/unthread right pants leg;Thread/unthread left pants leg;Don/Doff right sock;Don/Doff left sock;Don/Doff right shoe;Don/Doff left shoe;Fasten/unfasten right shoe;Fasten/unfasten left shoe Lower body dressing/undressing: 4: Min-Patient completed 75 plus % of tasks (performed in supine then)  FIM - Toileting Toileting: 1: Total-Patient completed zero steps, helper did all 3  FIM - Radio producer Devices: Nurse, learning disability Transfers: 4-To  toilet/BSC: Min A (steadying  Pt. > 75%);4-From toilet/BSC: Min A (steadying Pt. > 75%)  FIM - Control and instrumentation engineer Devices: Walker;Arm rests Bed/Chair Transfer: 4: Bed > Chair or W/C: Min A (steadying Pt. > 75%);4: Chair or W/C > Bed: Min A (steadying Pt. > 75%)  FIM - Locomotion: Wheelchair Distance: 25 Locomotion: Wheelchair: 1: Travels less than 50 ft with supervision, cueing or coaxing FIM - Locomotion: Ambulation Locomotion: Ambulation Assistive Devices: Administrator Ambulation/Gait Assistance: 4: Min assist;4: Min guard Locomotion: Ambulation: 4: Travels 150 ft or more with minimal assistance (Pt.>75%)  Comprehension Comprehension Mode: Auditory Comprehension: 3-Understands basic 50 - 74% of the time/requires cueing 25 - 50%  of the time  Expression Expression Mode: Verbal Expression: 2-Expresses basic 25 - 49% of the time/requires cueing 50 - 75% of the time. Uses single words/gestures.  Social Interaction Social Interaction: 4-Interacts appropriately 75 - 89% of the time - Needs redirection for appropriate language or to initiate interaction.  Problem Solving Problem Solving: 2-Solves basic 25 - 49% of the time - needs direction more than half the time to initiate, plan or complete simple activities  Memory Memory: 2-Recognizes or recalls 25 - 49% of the time/requires cueing 51 - 75% of the time  Medical Problem List and Plan:  Deconditioning related to multiple medical issues including multiple myeloma, L1 compression fx  1. DVT Prophylaxis/Anticoagulation: Mechanical: Antiembolism stockings, knee (TED hose) Bilateral lower extremities  Sequential compression devices, below knee Bilateral lower extremities. Continues with hemoptysis.  2. Back Pain/Pain Management: off oxycodone due to nausea and hallucinations. Reports tylenol and Sportscreme was effective today.   ultram prn for more severe pain-scheduled am dose helpful  -LSO for support  and pain management.--want this on when she's up  3. Mood: LCSW to follow for evaluation and support. Husband is very supportive  4. Neuropsych: This patient is capable of making decisions on her own behalf.  5. B-CAP: Antibiotic D# 11/14?Marland Kitchen Currently on Levaquin--probably will continue through the weekend.   -wean oxygen as posible 6. ABLA: Monitor serial H/H. hgb 7.4---stool for ob 7. Multiple Myeloma: Follow up with Dr. Jana Hakim for treatment/discussion in about a week or so  -answered some basic question from patient and husband today 25. Acute renal failure: Slowly improving after diuresis. Encourage fluids  9. DM type 2: resumed amaryl as po intake good. However had low sugar last night  -changed amaryl to qd only which has helped the hypoglycemia  -Discontinued metformin with AKI as well as (likely) MM diagnosis.     LOS (Days) 4 A FACE TO FACE EVALUATION WAS PERFORMED  [name] 04/04/2014 7:33 AM

## 2014-04-05 ENCOUNTER — Inpatient Hospital Stay (HOSPITAL_COMMUNITY): Payer: Medicare Other | Admitting: *Deleted

## 2014-04-05 ENCOUNTER — Inpatient Hospital Stay (HOSPITAL_COMMUNITY): Payer: Medicare Other | Admitting: Physical Therapy

## 2014-04-05 LAB — GLUCOSE, CAPILLARY
Glucose-Capillary: 137 mg/dL — ABNORMAL HIGH (ref 70–99)
Glucose-Capillary: 197 mg/dL — ABNORMAL HIGH (ref 70–99)
Glucose-Capillary: 113 mg/dL — ABNORMAL HIGH (ref 70–99)
Glucose-Capillary: 94 mg/dL (ref 70–99)

## 2014-04-05 LAB — CBC
HCT: 24.2 % — ABNORMAL LOW (ref 36.0–46.0)
HEMOGLOBIN: 7.5 g/dL — AB (ref 12.0–15.0)
MCH: 26.5 pg (ref 26.0–34.0)
MCHC: 31 g/dL (ref 30.0–36.0)
MCV: 85.5 fL (ref 78.0–100.0)
Platelets: 158 10*3/uL (ref 150–400)
RBC: 2.83 MIL/uL — ABNORMAL LOW (ref 3.87–5.11)
RDW: 23 % — ABNORMAL HIGH (ref 11.5–15.5)
WBC: 10.5 10*3/uL (ref 4.0–10.5)

## 2014-04-05 NOTE — Progress Notes (Signed)
Physical Therapy Session Note  Patient Details  Name: [name] MRN: [id number] Date of Birth: [birth year]  Today's Date: 04/05/2014 Time: 1000-1100 and 1355-1430 Time Calculation (min): 60 min and 35 min  Missed 10 min due to pt fatigue  Short Term Goals: Week 1:  PT Short Term Goal 1 (Week 1): STGs=LTGs due to ELOS  Skilled Therapeutic Interventions/Progress Updates:   AM Session: Pt received sitting in w/c, visiting with sister. Gait training using RW room <> gym 150 ft x 2 and 100 ft and multiple short distance trials with supervision. Performed car transfer with supervision and verbal cues for sequencing. Pt participated in game of horseshoes, picking up horseshoes from low mat with emphasis on proper body mechanics and maintaining back precautions, bending with knees and keeping back straight. Pt yelling out in pain when repositioning hips back in chair for rest, most likely due to decreased ability to push through LEs with elevated w/c seat and B feet on low foot rests. Pt negotiated up/down 5 stairs using 2 rails with min guard and up/down curb using RW with min assist to lift and lower RW. Pt performed all functional transfers with min guard-min A.Pt required frequent seated rest breaks secondary to low back pain. Left sitting in recliner with all needs within reach and sister present.   PM Session: For LE strengthening and activity tolerance pt performed NuStep BLEs only Level 3 x 10 min. Pt reported need for bathroom, gait training 150 ft using RW and supervision gym > room. Pt requesting total assist for clothing mgmt due to urgency while holding grab bar. Pt ambulated bathroom > sink for handwashing using RW and min guard. Pt requesting to end therapy session early secondary to fatigue and increase in back pain. Pt left sitting in recliner with all needs within reach and sister present.   Therapy Documentation Precautions:  Precautions Precautions: Back;Fall Required Braces  or Orthoses: Other Brace/Splint Other Brace/Splint: corset Restrictions Weight Bearing Restrictions: No Pain: Pain Assessment Pain Assessment: 0-10 Pain Score: 3  Pain Type: Acute pain Pain Location: Back Pain Orientation: Lower Pain Descriptors / Indicators: Aching;Sharp Pain Frequency: Intermittent Pain Onset: With Activity Patients Stated Pain Goal: 2 Pain Intervention(s): Repositioned;Ambulation/increased activity;Rest Locomotion : Ambulation Ambulation/Gait Assistance: 5: Supervision   See FIM for current functional status  Therapy/Group: Individual Therapy  Laretta Alstrom 04/05/2014, 11:05 AM

## 2014-04-05 NOTE — Progress Notes (Signed)
Chaplain requested to offer emotional support for patient, who received news of Multiple Myeloma diagnosis. Patient said she is "worried about treatment, about being a burden for her kids, about her kids being saved, about being right and going to heaven." Chaplain listened empathetically to patient's concerns, prayed with patient, and provided emotional and spiritual support. Will refer in the AM. Please page for additional support.   Ethelene Browns [phone]

## 2014-04-05 NOTE — Progress Notes (Addendum)
[facility] PHYSICAL MEDICINE & REHABILITATION     PROGRESS NOTE    Subjective/Complaints: Quiet day yesterday. No complaints today.  A 12 point review of systems has been performed and if not noted above is otherwise negative.   Objective: Vital Signs: Blood pressure 144/78, pulse 97, temperature 98.2 F (36.8 C), temperature source Oral, resp. rate 18, weight 81 kg (178 lb 9.2 oz), SpO2 93.00%. No results found.  Recent Labs  04/04/14 0540 04/05/14 0623  WBC 11.7* 10.5  HGB 7.3* 7.5*  HCT 23.3* 24.2*  PLT 143* 158   No results found for this basename: NA, K, CL, CO, GLUCOSE, BUN, CREATININE, CALCIUM,  in the last 72 hours CBG (last 3)   Recent Labs  04/04/14 1132 04/04/14 1630 04/04/14 2103  GLUCAP 136* 102* 118*    Wt Readings from Last 3 Encounters:  04/02/14 81 kg (178 lb 9.2 oz)  03/31/14 82.146 kg (181 lb 1.6 oz)  03/21/14 86.183 kg (190 lb)    Physical Exam:  Nursing note and vitals reviewed.  Constitutional: She is oriented to person, place, and time. She appears well-developed and well-nourished.  HENT: oral mucosa pink and moist  Head: Normocephalic and atraumatic.  Eyes: Conjunctivae are normal. Pupils are equal, round, and reactive to light.  Neck: Normal range of motion. Neck supple.  Cardiovascular: Normal rate and regular rhythm. No murmurs, rubs, or gallops  Respiratory: Effort normal and breath sounds normal. No respiratory distress. She has no wheezes.  GI: Soft. Bowel sounds are normal. She exhibits no distension. There is no tenderness.  Musculoskeletal: She exhibits no edema and no tenderness.  Mid-low back tender to general palpation palpation and with proximal LE and pelvic movements  Neurological: She is alert and oriented to person, place, and time.  UE 4/5 deltoid, bicep, tricep, HI. Bilateral LE: 3+/5 HF 4ke and 4/5 at ankles. No sensory loss in any of limbs. DTR's 1+. Reasonable insight and awareness. Good attention, memory  functional  Skin: Skin is warm and dry.  Psychiatric:    appropriate    Assessment/Plan: 1. Functional deficits secondary to deconditioning related to multiple myeloma as well as L1 compression fx which require 3+ hours per day of interdisciplinary therapy in a comprehensive inpatient rehab setting. Physiatrist is providing close team supervision and 24 hour management of active medical problems listed below. Physiatrist and rehab team continue to assess barriers to discharge/monitor patient progress toward functional and medical goals.     FIM: FIM - Bathing Bathing Steps Patient Completed: Chest;Right Arm;Left Arm;Abdomen;Front perineal area;Right upper leg;Left upper leg;Right lower leg (including foot);Left lower leg (including foot) Bathing: 4: Min-Patient completes 8-9 8f 10 parts or 75+ percent (supine)  FIM - Upper Body Dressing/Undressing Upper body dressing/undressing steps patient completed: Thread/unthread right sleeve of pullover shirt/dresss;Thread/unthread left sleeve of pullover shirt/dress;Put head through opening of pull over shirt/dress;Pull shirt over trunk;Thread/unthread right bra strap;Thread/unthread left bra strap Upper body dressing/undressing: 4: Min-Patient completed 75 plus % of tasks FIM - Lower Body Dressing/Undressing Lower body dressing/undressing steps patient completed: Thread/unthread right underwear leg;Thread/unthread left underwear leg;Thread/unthread right pants leg;Thread/unthread left pants leg;Don/Doff right sock;Don/Doff left sock;Don/Doff right shoe;Don/Doff left shoe;Fasten/unfasten right shoe;Fasten/unfasten left shoe Lower body dressing/undressing: 4: Min-Patient completed 75 plus % of tasks (performed in supine then)  FIM - Toileting Toileting: 1: Total-Patient completed zero steps, helper did all 3  FIM - Diplomatic Services operational officer Devices: Building control surveyor Transfers: 4-To toilet/BSC: Min A (steadying Pt. >  75%);4-From toilet/BSC:  Min A (steadying Pt. > 75%)  FIM - Control and instrumentation engineer Devices: Walker;Arm rests Bed/Chair Transfer: 4: Bed > Chair or W/C: Min A (steadying Pt. > 75%);4: Chair or W/C > Bed: Min A (steadying Pt. > 75%)  FIM - Locomotion: Wheelchair Distance: 25 Locomotion: Wheelchair: 1: Travels less than 50 ft with supervision, cueing or coaxing FIM - Locomotion: Ambulation Locomotion: Ambulation Assistive Devices: Administrator Ambulation/Gait Assistance: 4: Min assist;4: Min guard Locomotion: Ambulation: 4: Travels 150 ft or more with minimal assistance (Pt.>75%)  Comprehension Comprehension Mode: Auditory Comprehension: 3-Understands basic 50 - 74% of the time/requires cueing 25 - 50%  of the time  Expression Expression Mode: Verbal Expression: 2-Expresses basic 25 - 49% of the time/requires cueing 50 - 75% of the time. Uses single words/gestures.  Social Interaction Social Interaction: 4-Interacts appropriately 75 - 89% of the time - Needs redirection for appropriate language or to initiate interaction.  Problem Solving Problem Solving: 2-Solves basic 25 - 49% of the time - needs direction more than half the time to initiate, plan or complete simple activities  Memory Memory: 2-Recognizes or recalls 25 - 49% of the time/requires cueing 51 - 75% of the time  Medical Problem List and Plan:  Deconditioning related to multiple medical issues including multiple myeloma, L1 compression fx  1. DVT Prophylaxis/Anticoagulation: Mechanical: Antiembolism stockings, knee (TED hose) Bilateral lower extremities  Sequential compression devices, below knee Bilateral lower extremities. Continues with hemoptysis.  2. Back Pain/Pain Management: off oxycodone due to nausea and hallucinations. Reports tylenol and Sportscreme was effective today.   ultram prn for more severe pain-scheduled am dose helpful  -LSO for support and pain management.--want this on  when she's up  3. Mood: LCSW to follow for evaluation and support. Husband is very supportive  4. Neuropsych: This patient is capable of making decisions on her own behalf.  5. B-CAP: Antibiotic D# 12/14?Marland Kitchen Currently on Levaquin--probably will continue through the weekend.     6. ABLA: Monitor serial H/H. hgb 7.5---stop daily cbc's 7. Multiple Myeloma: Follow up with Dr. Jana Hakim for treatment/discussion in about a week or so  -answered some basic question from patient and husband today 26. Acute renal failure: Slowly improving after diuresis. Encourage fluids  9. DM type 2: resumed amaryl as po intake good. However had low sugar last night  -changed amaryl to qd only and sugars have leveled out  -Discontinued metformin with AKI as well as (likely) MM diagnosis.     LOS (Days) 5 A FACE TO FACE EVALUATION WAS PERFORMED  Meredith Staggers 04/05/2014 7:27 AM

## 2014-04-05 NOTE — Progress Notes (Signed)
Occupational Therapy Note  Patient Details  Name: QUORRA ROSENE MRN: [id number] Date of Birth: 1946-04-24 Today's Date: 04/05/2014  Time:0830-0930  (60 min)  1st session Pain:  4/10 Back  Nursing aware Individual session Addressed bathing and dressing, sit to stand, standing balance, functional mobility.  Husband present during session.  Pt.elected to do bed bath due to back pain.  Provided set up.  She ambulated to bathroom with RW and SBA.  Finished dressing at sink as well as grooming.  Left pt in room ready for next session.    Time:  1300-1330  (30 min)  2nd session Pain:  3/10  back Individual session Addressed functional mobility to ADL apartment.  Practiced transfers to sofa, bed, dining chair.  Instructed pt on placing extra pillows in chairs for added height if needed at home.  Pt. Returned all sit to stand with added height with SBA.  Ambulated to gym.  Did NuStep for 5 mins at 3 wk load with BLE and no UE.  Tolerated well.  Left in wc in gym for next session.      Lisa Roca 04/05/2014, 9:25 AM

## 2014-04-05 NOTE — Progress Notes (Signed)
Pt reading through material provided by oncology regarding MM treatment today, tearful at times. Reports that it is a lot of information to take in and parts of it seem scary. Provided support and offered to have chaplain come visit with pt, she stated she would let RN know when she was ready for that. Pain controlled with prn ultram and tylenol. Encouraged use of flutter valve to help expectorate old blood from lungs. Continue with plan of care. Hortencia Conradi RN

## 2014-04-06 ENCOUNTER — Inpatient Hospital Stay (HOSPITAL_COMMUNITY): Payer: Medicare Other | Admitting: Occupational Therapy

## 2014-04-06 ENCOUNTER — Inpatient Hospital Stay (HOSPITAL_COMMUNITY): Payer: Medicare Other | Admitting: *Deleted

## 2014-04-06 ENCOUNTER — Encounter: Payer: Self-pay | Admitting: Oncology

## 2014-04-06 ENCOUNTER — Encounter (HOSPITAL_COMMUNITY): Payer: Medicare Other | Admitting: Occupational Therapy

## 2014-04-06 DIAGNOSIS — R5381 Other malaise: Secondary | ICD-10-CM

## 2014-04-06 DIAGNOSIS — M8448XA Pathological fracture, other site, initial encounter for fracture: Secondary | ICD-10-CM

## 2014-04-06 LAB — GLUCOSE, CAPILLARY
GLUCOSE-CAPILLARY: 189 mg/dL — AB (ref 70–99)
GLUCOSE-CAPILLARY: 132 mg/dL — AB (ref 70–99)
Glucose-Capillary: 107 mg/dL — ABNORMAL HIGH (ref 70–99)
Glucose-Capillary: 100 mg/dL — ABNORMAL HIGH (ref 70–99)

## 2014-04-06 LAB — TISSUE HYBRIDIZATION (BONE MARROW)-NCBH

## 2014-04-06 LAB — CHROMOSOME ANALYSIS, BONE MARROW

## 2014-04-06 NOTE — Progress Notes (Signed)
Occupational Therapy Session Note  Patient Details  Name: [name] MRN: [id number] Date of Birth: 05-27-1946  Today's Date: 04/06/2014 Time: 1102-1155 Time Calculation (min): 53 min  Short Term Goals: Week 1:  OT Short Term Goal 1 (Week 1): STG = LTG  Skilled Therapeutic Interventions/Progress Updates:   Patient seen this am for OT intervention to address functional mobility and activity tolerance with basic self care skills.  Patient with very limited endurance for mobility tasks required for dressing.  Patient easily becomes short of breath although 02 saturation testing at 96%, and BP 144/79.  Patient very eager to return home with support from husband and sister.    Therapy Documentation Precautions:  Precautions Precautions: Back;Fall Required Braces or Orthoses: Other Brace/Splint Other Brace/Splint: corset for comfort, not required Restrictions Weight Bearing Restrictions: No   Pain: Denies pain.  I have some Ultram, and a patch for pain.  I am not having any pain.    ADL: ADL Youth worker: Shower seat with back ADL Comments: see FIM  See FIM for current functional status  Therapy/Group: Individual Therapy  Mariah Milling 04/06/2014, 12:00 PM

## 2014-04-06 NOTE — Progress Notes (Signed)
Occupational Therapy Discharge Summary  Patient Details  Name: [name] MRN: [id number] Date of Birth: December 26, 1945  Today's Date: 04/06/2014 Time: 6433-2951 Time Calculation (min): 26 min  Patient has met 7 of 7 long term goals due to improved activity tolerance, improved balance, postural control and ability to compensate for deficits.  Patient to discharge at overall Supervision level.  Patient's care partner sister observed, husband also has participated to provide the necessary physical assistance at discharge.    Reasons goals not met: NA  Recommendation:  Patient will benefit from ongoing skilled OT services in home health setting to continue to advance functional skills in the area of BADL and iADL.  Equipment: No equipment provided  Reasons for discharge: treatment goals met  Patient/family agrees with progress made and goals achieved: Yes  OT Discharge Precautions/Restrictions  Precautions Precautions: Back;Fall Other Brace/Splint: corset for comfort, not required Restrictions Weight Bearing Restrictions: No   Pain Pain Assessment Pain Assessment: 0-10 Pain Score: 2  Pain Type: Acute pain Pain Location: Back Pain Orientation: Lower Pain Descriptors / Indicators: Aching Pain Frequency: Intermittent Pain Onset: Gradual Patients Stated Pain Goal: 2 Pain Intervention(s): Medication (See eMAR);Repositioned;Emotional support Multiple Pain Sites: No ADL ADL Youth worker: Shower seat with back ADL Comments: see FIM Vision/Perception  Vision- History Baseline Vision/History: Wears glasses Wears Glasses: At all times Patient Visual Report: No change from baseline Vision- Assessment Vision Assessment?: No apparent visual deficits  Cognition Overall Cognitive Status: Within Functional Limits for tasks assessed Arousal/Alertness: Awake/alert Orientation Level: Oriented X4 Attention: Alternating Alternating Attention: Appears  intact Awareness: Appears intact Problem Solving: Appears intact Safety/Judgment: Appears intact Sensation Sensation Light Touch: Appears Intact Stereognosis: Appears Intact Hot/Cold: Appears Intact Proprioception: Appears Intact Coordination Gross Motor Movements are Fluid and Coordinated: Yes Fine Motor Movements are Fluid and Coordinated: Yes Motor  Motor Motor: Within Functional Limits Motor - Discharge Observations: Back pain and weakness limit fludity to movement patterns    Trunk/Postural Assessment  Cervical Assessment Cervical Assessment: Exceptions to [facility] (maintains forward head) Thoracic Assessment Thoracic Assessment: Exceptions to [facility] (kyphotic) Lumbar Assessment Lumbar Assessment: Within Functional Limits Postural Control Postural Control: Within Functional Limits  Balance Balance Balance Assessed: Yes Static Sitting Balance Static Sitting - Balance Support: Feet supported Static Sitting - Level of Assistance: 7: Independent Static Sitting - Comment/# of Minutes: 10 min Dynamic Sitting Balance Dynamic Sitting - Balance Support: No upper extremity supported;Feet supported;During functional activity Dynamic Sitting - Level of Assistance: 7: Independent Static Standing Balance Static Standing - Balance Support: Bilateral upper extremity supported;During functional activity Static Standing - Level of Assistance: 6: Modified independent (Device/Increase time) Extremity/Trunk Assessment RUE Assessment RUE Assessment: Within Functional Limits LUE Assessment LUE Assessment: Within Functional Limits  See FIM for current functional status  Mariah Milling 04/06/2014, 3:41 PM

## 2014-04-06 NOTE — Progress Notes (Signed)
Restless night, PRN trazodone 50mg  given at 2322, not effective. Reports having a lot on her mind. Continues to cough up blood tinged sputum. Yells out in pain when moved in bed, otherwise without complaint of pain. Requests bedpan at HS, reports too tired to get OOB. Cornell Barman

## 2014-04-06 NOTE — Progress Notes (Signed)
Chaplain responded to follow-up from overnight chaplain. Pt stated that she was feeling better this morning, but still feeling scared about her cancer diagnosis and its implications for her life. Chaplain offered emotional support and prayer. Chaplain also provided information about [facility]'s multiple myeloma support group. Chaplain available for follow-up as necessary.

## 2014-04-06 NOTE — Progress Notes (Signed)
She will be fine for a clinical trial.  She has Medicare and a supplement.  Benedetto Goad and Doristine Johns both advised.

## 2014-04-06 NOTE — Progress Notes (Signed)
Physical Therapy Discharge Summary  Patient Details  Name: [name] MRN: [id number] Date of Birth: September 18, 1946  Today's Date: 04/06/2014 Time: 8756-4332 and 1445-1311 Time Calculation (min): 55 min and 26 min  Patient has met 12 of 12 long term goals due to improved activity tolerance, improved balance, increased strength, decreased pain, ability to compensate for deficits and improved coordination.  Patient to discharge at an ambulatory level Modified Independent, supervision for stair negotiation and car transfers (for RW management). Patient's care partner is aware and understands recommendations for supervision/setup level of assistance for stair negotiation and car transfer to provide the necessary set up and safety assistance at discharge.  Reasons goals not met: N/A, all LTGs met.  Recommendation:  Patient will benefit from ongoing skilled PT services in home health setting to continue to advance safe functional mobility, address ongoing impairments in strength, balance, coordination, gait, overall functional mobility, and minimize fall risk.  Equipment: No equipment provided-patient owns RW and wheelchair (recommended for longer community distances)  Reasons for discharge: treatment goals met and discharge from hospital  Patient/family agrees with progress made and goals achieved: Yes  PT Discharge Precautions/Restrictions Precautions Precautions: Back;Fall Required Braces or Orthoses: Other Brace/Splint Other Brace/Splint: corset for comfort, not required Restrictions Weight Bearing Restrictions: No Pain Pain Assessment Pain Assessment: 0-10 Pain Score: 4  Pain Type: Acute pain Pain Location: Back Pain Orientation: Lower Pain Descriptors / Indicators: Aching Pain Onset: On-going Pain Intervention(s): RN made aware;Repositioned;Ambulation/increased activity Multiple Pain Sites: No Vision/Perception    Wears glasses all the time No visual changes from  baseline Cognition Overall Cognitive Status: Within Functional Limits for tasks assessed Arousal/Alertness: Awake/alert Orientation Level: Oriented X4 Memory: Appears intact Awareness: Appears intact Safety/Judgment: Appears intact Sensation Sensation Light Touch: Appears Intact Proprioception: Appears Intact Coordination Gross Motor Movements are Fluid and Coordinated: Yes Fine Motor Movements are Fluid and Coordinated: Yes Motor  Motor Motor: Within Functional Limits  Mobility Bed Mobility Bed Mobility: Supine to Sit;Sitting - Scoot to Edge of Bed;Sit to Supine Supine to Sit: 6: Modified independent (Device/Increase time);HOB flat Sitting - Scoot to Edge of Bed: 6: Modified independent (Device/Increase time) Sit to Supine: 6: Modified independent (Device/Increase time);HOB flat Transfers Transfers: Yes Sit to Stand: From bed;With armrests;With upper extremity assist;From chair/3-in-1;6: Modified independent (Device/Increase time) Stand to Sit: 6: Modified independent (Device/Increase time);To chair/3-in-1;To bed;With upper extremity assist;With armrests Stand Pivot Transfers: 6: Modified independent (Device/Increase time);With armrests Locomotion  Ambulation Ambulation: Yes Ambulation/Gait Assistance: 6: Modified independent (Device/Increase time) Ambulation Distance (Feet): 160 Feet Assistive device: Rolling walker Ambulation/Gait Assistance Details: Patient performed gait training 160' x1 in controlled environment, 25' x1 in home environment (ADL apartment), and >150' x1 in community environment (hospital lobby and outside), as well as uneven surfaces outside with RW and mod I. Gait Gait: Yes Gait Pattern: Impaired Gait Pattern: Step-through pattern;Narrow base of support;Trunk flexed;Decreased stride length Stairs / Additional Locomotion Stairs: Yes Stairs Assistance: 5: Supervision Stairs Assistance Details: Verbal cues for precautions/safety; Verbal cues for  sequencing Stair Management Technique: One rail Right;One rail Left;Step to pattern;Forwards Number of Stairs: 4 Height of Stairs: 6 Wheelchair Mobility Wheelchair Mobility: Yes Wheelchair Assistance: 6: Modified independent (Device/Increase time) Environmental health practitioner: Both upper extremities Wheelchair Parts Management: Needs assistance Distance: 150  Trunk/Postural Assessment  Cervical Assessment Cervical Assessment: Exceptions to [facility] (forward neck posture) Thoracic Assessment Thoracic Assessment: Exceptions to [facility] (kyphotic posture) Lumbar Assessment Lumbar Assessment: Within Functional Limits Postural Control Postural Control: Within Functional Limits  Balance Balance Balance Assessed:  Yes Static Sitting Balance Static Sitting - Balance Support: Bilateral upper extremity supported;Feet supported Static Sitting - Level of Assistance: 6: Modified independent (Device/Increase time) Dynamic Sitting Balance Dynamic Sitting - Balance Support: No upper extremity supported;Feet supported;During functional activity (donning pants) Dynamic Sitting - Level of Assistance: 6: Modified independent (Device/Increase time) Static Standing Balance Static Standing - Balance Support: During functional activity;Bilateral upper extremity supported;No upper extremity supported Static Standing - Level of Assistance: 6: Modified independent (Device/Increase time) Extremity Assessment  RLE Assessment RLE Assessment: Within Functional Limits (3+ to 4-/5) LLE Assessment LLE Assessment: Within Functional Limits (3+ to 4-/5)  See FIM for current functional status  Harmony. Dailen Mcclish, PT, DPT 04/06/2014, 12:17 PM

## 2014-04-06 NOTE — Progress Notes (Signed)
Social Work Patient ID: [name], female   DOB: [birth year], 68 y.o.   MRN: [id number] Team feels pt is ready for discharge tomorrow.  MD agreeable and pt glad to be going home.

## 2014-04-06 NOTE — Progress Notes (Signed)
Occupational Therapy Session Note  Patient Details  Name: [name] MRN: [id number] Date of Birth: May 09, 1946  Today's Date: 04/06/2014 Time: 7654-6503 Time Calculation (min): 38 min  Short Term Goals: Week 1:  OT Short Term Goal 1 (Week 1): STG = LTG  Skilled Therapeutic Interventions/Progress Updates:    Patient seen this pm for OT intervention to address functional mobility, safety, and activity tolerance with ADL/IADL.  Reviewed and practiced shower stall transfer.  Reviewed energy conservation techniques for kitchen tasks, cleaning and helping with meal preparation.  Patient interested in purchasing a bar height stool to increase ability to help husband during meal times.  Patient's pastor arrived and patient very interested in speaking with pastor.     Therapy Documentation Precautions:  Precautions Precautions: Back;Fall Required Braces or Orthoses: Other Brace/Splint Other Brace/Splint: corset for comfort, not required Restrictions Weight Bearing Restrictions: No   Pain: Pain Assessment Pain Assessment: 0-10 Pain Score: 2  Pain Type: Acute pain Pain Location: Back Pain Orientation: Lower Pain Descriptors / Indicators: Aching Pain Frequency: Intermittent Pain Onset: Gradual Patients Stated Pain Goal: 2 Pain Intervention(s): Medication (See eMAR);Repositioned;Emotional support Multiple Pain Sites: No ADL: ADL Youth worker: Shower seat with back ADL Comments: see FIM  See FIM for current functional status  Therapy/Group: Individual Therapy  Mariah Milling 04/06/2014, 2:23 PM

## 2014-04-06 NOTE — Progress Notes (Signed)
Recreational Therapy Session Note  Patient Details  Name: [name] MRN: [id number] Date of Birth: Apr 26, 1946 Today's Date: 04/06/2014  Pain: no c/o Skilled Therapeutic Interventions/Progress Updates: Session focused on activity tolerance, functional mobility, & overall safety.  Pt ambulated using RW through obstacle course with supervision after given verbal instruction.  Pt limited by low activity tolerance requiring rest breaks in which she is able to identify & initiate independently.  Discharge planning discussion with pt including community reintegration, specifically to hair salon as pt states she plans to get hair done at discharge.  Pt able to identify potential safety concerns & safe ways to negotiate those obstacles.  Potential for pt to discharge home tomorrow.   Waldon Reining 04/06/2014, 2:50 PM

## 2014-04-06 NOTE — Progress Notes (Signed)
[facility] PHYSICAL MEDICINE & REHABILITATION     PROGRESS NOTE    Subjective/Complaints: No problems. Slept well. Occasional cough. No sob A 12 point review of systems has been performed and if not noted above is otherwise negative.   Objective: Vital Signs: Blood pressure 138/78, pulse 92, temperature 98.1 F (36.7 C), temperature source Oral, resp. rate 16, weight 81 kg (178 lb 9.2 oz), SpO2 91.00%. No results found.  Recent Labs  04/04/14 0540 04/05/14 0623  WBC 11.7* 10.5  HGB 7.3* 7.5*  HCT 23.3* 24.2*  PLT 143* 158   No results found for this basename: NA, K, CL, CO, GLUCOSE, BUN, CREATININE, CALCIUM,  in the last 72 hours CBG (last 3)   Recent Labs  04/05/14 1606 04/05/14 2048 04/06/14 0717  GLUCAP 113* 94 107*    Wt Readings from Last 3 Encounters:  04/02/14 81 kg (178 lb 9.2 oz)  03/31/14 82.146 kg (181 lb 1.6 oz)  03/21/14 86.183 kg (190 lb)    Physical Exam:  Nursing note and vitals reviewed.  Constitutional: She is oriented to person, place, and time. She appears well-developed and well-nourished.  HENT: oral mucosa pink and moist  Head: Normocephalic and atraumatic.  Eyes: Conjunctivae are normal. Pupils are equal, round, and reactive to light.  Neck: Normal range of motion. Neck supple.  Cardiovascular: Normal rate and regular rhythm. No murmurs, rubs, or gallops  Respiratory: Effort normal and breath sounds normal. No respiratory distress. She has no wheezes.  GI: Soft. Bowel sounds are normal. She exhibits no distension. There is no tenderness.  Musculoskeletal: She exhibits no edema and no tenderness.  Mid-low back tender to general palpation palpation and with proximal LE and pelvic movements  Neurological: She is alert and oriented to person, place, and time.  UE 4/5 deltoid, bicep, tricep, HI. Bilateral LE: 3+/5 HF 4ke and 4/5 at ankles. No sensory loss in any of limbs. DTR's 1+. Reasonable insight and awareness. Good attention, memory  functional  Skin: Skin is warm and dry.  Psychiatric:    appropriate    Assessment/Plan: 1. Functional deficits secondary to deconditioning related to multiple myeloma as well as L1 compression fx which require 3+ hours per day of interdisciplinary therapy in a comprehensive inpatient rehab setting. Physiatrist is providing close team supervision and 24 hour management of active medical problems listed below. Physiatrist and rehab team continue to assess barriers to discharge/monitor patient progress toward functional and medical goals.     FIM: FIM - Bathing Bathing Steps Patient Completed: Chest;Right Arm;Left Arm;Abdomen;Front perineal area;Right upper leg;Left upper leg;Right lower leg (including foot);Left lower leg (including foot) Bathing: 4: Min-Patient completes 8-9 44f 10 parts or 75+ percent  FIM - Upper Body Dressing/Undressing Upper body dressing/undressing steps patient completed: Thread/unthread right sleeve of pullover shirt/dresss;Thread/unthread left sleeve of pullover shirt/dress;Put head through opening of pull over shirt/dress;Pull shirt over trunk Upper body dressing/undressing: 5: Set-up assist to: Obtain clothing/put away FIM - Lower Body Dressing/Undressing Lower body dressing/undressing steps patient completed: Thread/unthread right underwear leg;Thread/unthread left underwear leg;Thread/unthread right pants leg;Thread/unthread left pants leg;Pull underwear up/down;Pull pants up/down Lower body dressing/undressing: 3: Mod-Patient completed 50-74% of tasks  FIM - Toileting Toileting steps completed by patient: Adjust clothing prior to toileting;Performs perineal hygiene;Adjust clothing after toileting Toileting: 4: Steadying assist  FIM - Radio producer Devices: Nurse, learning disability Transfers: 4-To toilet/BSC: Min A (steadying Pt. > 75%);4-From toilet/BSC: Min A (steadying Pt. > 75%)  FIM - Bed/Chair Transfer Bed/Chair  Transfer Assistive Devices: Environmental consultant;Arm rests Bed/Chair Transfer: 4: Bed > Chair or W/C: Min A (steadying Pt. > 75%);6: Supine > Sit: No assist  FIM - Locomotion: Wheelchair Distance: 25 Locomotion: Wheelchair: 1: Total Assistance/staff pushes wheelchair (Pt<25%) FIM - Locomotion: Ambulation Locomotion: Ambulation Assistive Devices: Administrator Ambulation/Gait Assistance: 5: Supervision Locomotion: Ambulation: 5: Travels 150 ft or more with supervision/safety issues  Comprehension Comprehension Mode: Auditory Comprehension: 3-Understands basic 50 - 74% of the time/requires cueing 25 - 50%  of the time  Expression Expression Mode: Verbal Expression: 2-Expresses basic 25 - 49% of the time/requires cueing 50 - 75% of the time. Uses single words/gestures.  Social Interaction Social Interaction: 4-Interacts appropriately 75 - 89% of the time - Needs redirection for appropriate language or to initiate interaction.  Problem Solving Problem Solving: 2-Solves basic 25 - 49% of the time - needs direction more than half the time to initiate, plan or complete simple activities  Memory Memory: 2-Recognizes or recalls 25 - 49% of the time/requires cueing 51 - 75% of the time  Medical Problem List and Plan:  Deconditioning related to multiple medical issues including multiple myeloma, L1 compression fx  1. DVT Prophylaxis/Anticoagulation: Mechanical: Antiembolism stockings, knee (TED hose) Bilateral lower extremities  Sequential compression devices, below knee Bilateral lower extremities. Continues with hemoptysis.  2. Back Pain/Pain Management: off oxycodone due to nausea and hallucinations. Reports tylenol and Sportscreme was effective today.   ultram prn for more severe pain-scheduled am dose helpful  -LSO for support and pain management.--want this on when she's up  3. Mood: LCSW to follow for evaluation and support. Husband is very supportive  4. Neuropsych: This patient is capable of  making decisions on her own behalf.  5. B-CAP: Antibiotic D# 13/14?Marland Kitchen Currently on Levaquin--dc after next dose     6. ABLA: Monitor serial H/H. hgb 7.5---stop daily cbc's 7. Multiple Myeloma: Follow up with Dr. Jana Hakim for treatment/discussion in about a week or so  -answered some basic question from patient and husband today 63. Acute renal failure: Slowly improving after diuresis. Encourage fluids  9. DM type 2: resumed amaryl as po intake good. However had low sugar last night  -changed amaryl to qd only and sugars have leveled out  -Discontinued metformin with AKI as well as (likely) MM diagnosis.     LOS (Days) 6 A FACE TO FACE EVALUATION WAS PERFORMED  Meredith Staggers 04/06/2014 7:53 AM

## 2014-04-07 ENCOUNTER — Other Ambulatory Visit: Payer: Self-pay | Admitting: *Deleted

## 2014-04-07 ENCOUNTER — Other Ambulatory Visit: Payer: Medicare Other

## 2014-04-07 ENCOUNTER — Other Ambulatory Visit: Payer: Self-pay | Admitting: Oncology

## 2014-04-07 ENCOUNTER — Other Ambulatory Visit: Payer: Self-pay | Admitting: Physician Assistant

## 2014-04-07 ENCOUNTER — Telehealth: Payer: Self-pay | Admitting: Oncology

## 2014-04-07 ENCOUNTER — Ambulatory Visit (HOSPITAL_COMMUNITY)
Admission: RE | Admit: 2014-04-07 | Discharge: 2014-04-07 | Disposition: A | Discharge status: Home / Self Care | Payer: Medicare Other | Source: Intra-hospital | Attending: Physical Medicine & Rehabilitation | Admitting: Physical Medicine & Rehabilitation

## 2014-04-07 DIAGNOSIS — C9000 Multiple myeloma not having achieved remission: Secondary | ICD-10-CM

## 2014-04-07 DIAGNOSIS — D649 Anemia, unspecified: Secondary | ICD-10-CM

## 2014-04-07 DIAGNOSIS — M8448XA Pathological fracture, other site, initial encounter for fracture: Secondary | ICD-10-CM

## 2014-04-07 DIAGNOSIS — R5381 Other malaise: Secondary | ICD-10-CM

## 2014-04-07 DIAGNOSIS — C9002 Multiple myeloma in relapse: Secondary | ICD-10-CM | POA: Insufficient documentation

## 2014-04-07 LAB — GLUCOSE, CAPILLARY: Glucose-Capillary: 136 mg/dL — ABNORMAL HIGH (ref 70–99)

## 2014-04-07 MED ORDER — SENNOSIDES-DOCUSATE SODIUM 8.6-50 MG PO TABS: 2.0000 | ORAL_TABLET | Freq: Every day | ORAL | Status: DC

## 2014-04-07 MED ORDER — LIDOCAINE 5 % EX PTCH: MEDICATED_PATCH | CUTANEOUS | Status: DC

## 2014-04-07 MED ORDER — LEVOFLOXACIN 750 MG PO TABS
750.0000 mg | ORAL_TABLET | ORAL | Status: AC
  Administered 2014-04-07: 750 mg via ORAL
  Filled 2014-04-07: qty 1

## 2014-04-07 MED ORDER — METHOCARBAMOL 500 MG PO TABS: 500.0000 mg | ORAL_TABLET | Freq: Four times a day (QID) | ORAL | Status: DC | PRN

## 2014-04-07 MED ORDER — ACETAMINOPHEN 325 MG PO TABS: 325.0000 mg | ORAL_TABLET | ORAL | Status: DC | PRN

## 2014-04-07 MED ORDER — GLIMEPIRIDE 2 MG PO TABS: 2.0000 mg | ORAL_TABLET | Freq: Every day | ORAL | Status: DC

## 2014-04-07 MED ORDER — FLUCONAZOLE 100 MG PO TABS
100.0000 mg | ORAL_TABLET | Freq: Every day | ORAL | Status: DC
  Filled 2014-04-07: qty 1

## 2014-04-07 MED ORDER — NYSTATIN 100000 UNIT/GM EX POWD
Freq: Three times a day (TID) | CUTANEOUS | Status: DC
  Administered 2014-04-07: 12:00:00 via TOPICAL
  Filled 2014-04-07: qty 15

## 2014-04-07 MED ORDER — FLUCONAZOLE 200 MG PO TABS
200.0000 mg | ORAL_TABLET | Freq: Once | ORAL | Status: AC
  Administered 2014-04-07: 200 mg via ORAL
  Filled 2014-04-07: qty 1

## 2014-04-07 MED ORDER — TRAMADOL HCL 50 MG PO TABS: ORAL_TABLET | ORAL | Status: DC

## 2014-04-07 MED ORDER — METOPROLOL TARTRATE 25 MG PO TABS: 25.0000 mg | ORAL_TABLET | Freq: Two times a day (BID) | ORAL | Status: DC

## 2014-04-07 MED ORDER — NYSTATIN 100000 UNIT/GM EX POWD: 1.0000 g | Freq: Three times a day (TID) | CUTANEOUS | Status: DC

## 2014-04-07 MED ORDER — FLUCONAZOLE 100 MG PO TABS: 100.0000 mg | ORAL_TABLET | Freq: Every day | ORAL | Status: DC

## 2014-04-07 NOTE — Progress Notes (Signed)
PHYSICAL MEDICINE & REHABILITATION     PROGRESS NOTE    Subjective/Complaints: Excited to go home. No complaints. A 12 point review of systems has been performed and if not noted above is otherwise negative.   Objective: Vital Signs: Blood pressure 148/85, pulse 97, temperature 98 F (36.7 C), temperature source Oral, resp. rate 19, weight 536.242 kg (1182 lb 3.2 oz), SpO2 94.00%. No results found.  Recent Labs  04/05/14 0623  WBC 10.5  HGB 7.5*  HCT 24.2*  PLT 158   No results found for this basename: NA, K, CL, CO, GLUCOSE, BUN, CREATININE, CALCIUM,  in the last 72 hours CBG (last 3)   Recent Labs  04/06/14 1109 04/06/14 1630 04/06/14 2118  GLUCAP 189* 100* 132*    Wt Readings from Last 3 Encounters:  04/06/14 536.242 kg (1182 lb 3.2 oz)  03/31/14 82.146 kg (181 lb 1.6 oz)  03/21/14 86.183 kg (190 lb)    Physical Exam:  Nursing note and vitals reviewed.  Constitutional: She is oriented to person, place, and time. She appears well-developed and well-nourished.  HENT: oral mucosa pink and moist  Head: Normocephalic and atraumatic.  Eyes: Conjunctivae are normal. Pupils are equal, round, and reactive to light.  Neck: Normal range of motion. Neck supple.  Cardiovascular: Normal rate and regular rhythm. No murmurs, rubs, or gallops  Respiratory: Effort normal and breath sounds normal. No respiratory distress. She has no wheezes.  GI: Soft. Bowel sounds are normal. She exhibits no distension. There is no tenderness.  Musculoskeletal: She exhibits no edema and no tenderness.  Mid-low back tender to general palpation palpation and with proximal LE and pelvic movements  Neurological: She is alert and oriented to person, place, and time.  UE 4/5 deltoid, bicep, tricep, HI. Bilateral LE: 3+/5 HF 4ke and 4/5 at ankles. No sensory loss in any of limbs. DTR's 1+. Reasonable insight and awareness. Good attention, memory functional  Skin: Skin is warm and dry.   Psychiatric:    appropriate    Assessment/Plan: 1. Functional deficits secondary to deconditioning related to multiple myeloma as well as L1 compression fx which require 3+ hours per day of interdisciplinary therapy in a comprehensive inpatient rehab setting. Physiatrist is providing close team supervision and 24 hour management of active medical problems listed below. Physiatrist and rehab team continue to assess barriers to discharge/monitor patient progress toward functional and medical goals.  Home today. Dc goals met    FIM: FIM - Bathing Bathing Steps Patient Completed: Chest;Right Arm;Left Arm;Abdomen;Front perineal area;Buttocks;Right upper leg;Left upper leg;Right lower leg (including foot);Left lower leg (including foot) Bathing: 5: Set-up assist to: Obtain items  FIM - Upper Body Dressing/Undressing Upper body dressing/undressing steps patient completed: Thread/unthread right bra strap;Thread/unthread left bra strap;Hook/unhook bra;Thread/unthread right sleeve of pullover shirt/dresss;Thread/unthread left sleeve of pullover shirt/dress;Put head through opening of pull over shirt/dress;Pull shirt over trunk Upper body dressing/undressing: 5: Set-up assist to: Obtain clothing/put away FIM - Lower Body Dressing/Undressing Lower body dressing/undressing steps patient completed: Thread/unthread right underwear leg;Thread/unthread left underwear leg;Pull underwear up/down;Thread/unthread right pants leg;Thread/unthread left pants leg;Pull pants up/down;Don/Doff left sock;Don/Doff right sock Lower body dressing/undressing: 5: Set-up assist to: Obtain clothing  FIM - Toileting Toileting steps completed by patient: Adjust clothing prior to toileting;Performs perineal hygiene;Adjust clothing after toileting Toileting: 5: Supervision: Safety issues/verbal cues  FIM - Radio producer Devices: Environmental consultant;Bedside commode Toilet Transfers: 5-From toilet/BSC:  Supervision (verbal cues/safety issues);5-To toilet/BSC: Supervision (verbal cues/safety issues)  FIM - Bed/Chair  Financial planner Devices: Environmental consultant;Arm rests Bed/Chair Transfer: 6: Assistive device: no helper;6: More than reasonable amt of time;6: Supine > Sit: No assist;6: Sit > Supine: No assist;6: Bed > Chair or W/C: No assist;6: Chair or W/C > Bed: No assist  FIM - Locomotion: Wheelchair Distance: 150 Locomotion: Wheelchair: 6: Travels 150 ft or more, turns around, maneuvers to table, bed or toilet, negotiates 3% grade: maneuvers on rugs and over door sills independently FIM - Locomotion: Ambulation Locomotion: Ambulation Assistive Devices: Administrator Ambulation/Gait Assistance: 6: Modified independent (Device/Increase time) Locomotion: Ambulation: 6: Travels 150 ft or more with assistive device/no helper  Comprehension Comprehension Mode: Auditory Comprehension: 5-Follows basic conversation/direction: With no assist  Expression Expression Mode: Verbal Expression: 5-Expresses basic needs/ideas: With no assist  Social Interaction Social Interaction: 6-Interacts appropriately with others with medication or extra time (anti-anxiety, antidepressant).  Problem Solving Problem Solving: 5-Solves basic problems: With no assist  Memory Memory: 5-Recognizes or recalls 90% of the time/requires cueing < 10% of the time  Medical Problem List and Plan:  Deconditioning related to multiple medical issues including multiple myeloma, L1 compression fx  1. DVT Prophylaxis/Anticoagulation: Mechanical: Antiembolism stockings, knee (TED hose) Bilateral lower extremities  Sequential compression devices, below knee Bilateral lower extremities. Continues with hemoptysis.  2. Back Pain/Pain Management: off oxycodone due to nausea and hallucinations. Reports tylenol and Sportscreme was effective today.   ultram prn for more severe pain-scheduled am dose helpful  -LSO for  support and pain management.--want this on when she's up  3. Mood: LCSW to follow for evaluation and support. Husband is very supportive  4. Neuropsych: This patient is capable of making decisions on her own behalf.  5. B-CAP: finish levaquin today    6. ABLA: Monitor serial H/H. hgb 7.5---stop daily cbc's 7. Multiple Myeloma: Follow up with Dr. Jana Hakim for treatment/discussion in about a week or so  -answered some basic question from patient and husband today 74. Acute renal failure: Slowly improving after diuresis. Encourage fluids  9. DM type 2: resumed amaryl as po intake good. However had low sugar last night  -changed amaryl to qd only and sugars have leveled out--further titration as outpt  -Discontinued metformin with AKI as well as (likely) MM diagnosis.     LOS (Days) 7 A FACE TO FACE EVALUATION WAS PERFORMED  Meredith Staggers 04/07/2014 7:34 AM

## 2014-04-07 NOTE — Telephone Encounter (Signed)
Rad to schedule the bone survey appt.

## 2014-04-07 NOTE — Progress Notes (Signed)
Recreational Therapy Discharge Summary Patient Details  Name: JAMAYA SLEETH MRN: [id number] Date of Birth: 19-Jun-1946 Today's Date: 04/07/2014  Long term goals set: 1  Long term goals met: 1  Comments on progress toward goals: Pt has made great progress toward goal and is discharging home today at Mod I level using RW.  Pt is anxious to return home & return to prior leisure activities as tolerated.  Education provided on energy conservation & community reintegration. Reasons for discharge: Discharge from hospital Patient/family agrees with progress made and goals achieved: Yes  Waldon Reining 04/07/2014, 8:05 AM

## 2014-04-07 NOTE — Progress Notes (Signed)
Patient and spouse received written and verbal discharge instructions from [facility], deny any questions or concerns, aware of follow up appointments, DME equipment delivered to room, personal belongings packed by spouse, patient taken down to private vehicle by NT via wheelchair. Carmello Cabiness Selinda Eon [place]

## 2014-04-07 NOTE — Discharge Instructions (Signed)
Inpatient Rehab Discharge Instructions  [place] Discharge date and time:  04/07/14  Activities/Precautions/ Functional Status: Activity: activity as tolerated Avoid bending, twisting and arching to help decrease pain Diet: diabetic diet Wound Care: keep wound clean and dry  Functional status:  ___ No restrictions     ___ Walk up steps independently _X__ 24/7 supervision/assistance   ___ Walk up steps with assistance ___ Intermittent supervision/assistance  ___ Bathe/dress independently _X__ Walk with walker    ___ Bathe/dress with assistance ___ Walk Independently    ___ Shower independently ___ Walk with assistance    ___ Shower with assistance _X__ No alcohol     ___ Return to work/school ________  Special Instructions:  COMMUNITY REFERRALS UPON DISCHARGE:    Home Health:   PT, OT, RN  Agency:[facility] HUTML:[phone] Date of last service:04/07/2014   Medical Equipment/Items Ordered:NO NEEDS HAS FROM PREVIOUS ADMITS     My questions have been answered and I understand these instructions. I will adhere to these goals and the provided educational materials after my discharge from the hospital.  Patient/Caregiver Signature _______________________________ Date __________  Clinician Signature _______________________________________ Date __________  Please bring this form and your medication list with you to all your follow-up doctor's appointments.

## 2014-04-07 NOTE — Discharge Summary (Signed)
Physician Discharge Summary  Patient ID: [name] MRN: [id number] DOB/AGE: [birth year] 68 y.o.  Admit date: 03/31/2014 Discharge date: 04/07/2014  Discharge Diagnoses:  Principal Problem:   Physical deconditioning Active Problems:   Type II or unspecified type diabetes mellitus without mention of complication, uncontrolled   Vertebral fracture, closed   Multiple myeloma, without mention of having achieved remission   Discharged Condition:  Improved.     Labs:  Basic Metabolic Panel: Results for [name], [name] (MRN [id number]) as of 04/15/2014 18:50  Ref. Range 04/01/2014 05:40  Sodium Latest Range: 136-145 mEq/L 138  Potassium Latest Range: 3.5-5.1 mEq/L 4.1  Chloride Latest Range: 96-112 mEq/L 96  CO2 Latest Range: 19-32 mEq/L 25  BUN Latest Range: 7.0-26.0 mg/dL 25 (H)  Creatinine Latest Range: 0.50-1.10 mg/dL 1.23 (H)  Calcium Latest Range: 8.4-10.5 mg/dL 8.0 (L)  GFR calc non Af Amer Latest Range: >90 mL/min 45 (L)  GFR calc Af Amer Latest Range: >90 mL/min 52 (L)  Glucose No range found 102 (H)  Alkaline Phosphatase Latest Range: 39-117 U/L 59  Albumin Latest Range: 3.5-5.2 g/dL 1.8 (L)  AST Latest Range: 0-37 U/L 26  ALT Latest Range: 0-35 U/L 8  Total Protein Latest Range: 6.4-8.3 g/dL 9.9 (H)  Total Bilirubin Latest Range: 0.3-1.2 mg/dL 0.4    CBC: Results for [name], [name] (MRN [id number]) as of 04/15/2014 18:50  Ref. Range 04/04/2014 05:40 04/05/2014 06:23  WBC Latest Range: 4.6-10.2 K/uL 11.7 (H) 10.5  RBC Latest Range: 4.04-5.48 M/uL 2.72 (L) 2.83 (L)  Hemoglobin Latest Range: 12.0-15.0 g/dL 7.3 (L) 7.5 (L)  HCT Latest Range: 36.0-46.0 % 23.3 (L) 24.2 (L)  MCV Latest Range: 80-97 fL 85.7 85.5  MCH Latest Range: 27-31.2 pg 26.8 26.5  MCHC Latest Range: 31.8-35.4 g/dL 31.3 31.0  RDW Latest Range: 11.5-15.5 % 22.6 (H) 23.0 (H)  Platelets Latest Range: 150-400 K/uL 143 (L) 158     Brief HPI:   [name] is a 68 y.o. female with history  of DM, neurofibromatosis, PE-chronic coumadin; who was admitted on 03/24/14 with hemoptysis, hematuria as well as severe back pain. Patient with bilateral infiltrates with supratherapeutic INR- 6.0 as well as drop in Hgb from 10.2 to 7.4. CTA negative for PE. She was started on IV antibiotics for HCAP coverage and coumadin reversed with 4 units FFP. She was transfused with 2 units PRBC and Dr. Nelda Marseille recommended discontinuing anti-coagulation until hemoptysis resolves. Lumbar spine X-rays with L1 endplate compression fracture and NM bone scan with mottled appearance of bony structures with question of myeloma. Hypercalcemia treated with Zometa as well as calcitonin. Dr. Jana Hakim consulted for input and recommended bone marrow biopsy. 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 PT evaluation done and CIR recommended for follow up therapies.   Hospital Course: [name] was admitted to rehab 03/31/2014 for inpatient therapies to consist of PT, ST and OT at least three hours five days a week. Past admission physiatrist, therapy team and rehab RN have worked together to provide customized collaborative inpatient rehab. She was maintained of levaquin for 14 days for treatment of HCAP. Hemoptysis had resolved with rare occasions of dark blood streaked sputum reported. Follow up labs showed leucocytosis to be resolving and H/H has been stable. Mood has been stable with ego support provided by team. Oxycodone was discontinue due to hallucinations and pain control has gradually improved on tylenol as well as Sportscreme. PO intake has improved and BS were well  controlled on low dose Amaryl. Metformin was discontinued due to renal insufficiency.  Dr. Jana Hakim has followed up to dicuss diagnosis as well as treatment options of MM. She is to follow up with him past discharge. She made good progress during her rehab stay and was at modified independent to supervision level overall. She will continue  to receive HHPT and HHOT past discharge.    Physical Exam:  Nursing note and vitals reviewed.  Constitutional: She is oriented to person, place, and time. She appears well-developed and well-nourished.  HENT: oral mucosa pink and moist  Head: Normocephalic and atraumatic.  Eyes: Conjunctivae are normal. Pupils are equal, round, and reactive to light.  Neck: Normal range of motion. Neck supple.  Cardiovascular: Normal rate and regular rhythm. No murmurs, rubs, or gallops  Respiratory: Effort normal and breath sounds normal. No respiratory distress. She has no wheezes.  GI: Soft. Bowel sounds are normal. She exhibits no distension. There is no tenderness.  Musculoskeletal: She exhibits no edema and no tenderness.  Mid-low back tender to general palpation palpation and with proximal LE and pelvic movements  Neurological: She is alert and oriented to person, place, and time.  UE 4/5 deltoid, bicep, tricep, HI. Bilateral LE: 3+/5 HF 4ke and 4/5 at ankles. No sensory loss in any of limbs. DTR's 1+. Reasonable insight and awareness. Good attention, memory functional  Skin: Skin is warm and dry.  Psychiatric:  appropriate     Rehab course: During patient's stay in rehab weekly team conferences were held to monitor patient's progress, set goals and discuss barriers to discharge. Patient has had improvement in activity tolerance, balance, postural control, as well as ability to compensate for deficits.  She requires supervision for ADL tasks. She is able to ambulate at modified independent level with RW and requires supervision for stair navigation and car transfers. Family education was done with husband regarding supervision needed.   Disposition: 01-Home or Self Care  Diet: Diabetic.         Future Appointments Provider Department Dept Phone   04/17/2014 1:00 PM [facility] [phone]   04/28/2014 9:30 AM Chauncey Cruel, [place] [facility] [phone]   05/13/2014 3:45 PM Darlin Coco, MD [place] [facility] [phone]   06/03/2014 11:00 AM Meredith Staggers, MD [place] [phone]       Medication List    STOP taking these medications       glimepiride 2 MG tablet  Commonly known as:  AMARYL     losartan 25 MG tablet  Commonly known as:  COZAAR     metFORMIN 1000 MG tablet  Commonly known as:  GLUCOPHAGE     methocarbamol 500 MG tablet  Commonly known as:  ROBAXIN     metoprolol succinate 25 MG 24 hr tablet  Commonly known as:  TOPROL-XL     oxyCODONE 15 MG immediate release tablet  Commonly known as:  ROXICODONE      TAKE these medications       acetaminophen 325 MG tablet  Commonly known as:  TYLENOL  Take 1-2 tablets (325-650 mg total) by mouth every 4 (four) hours as needed for mild pain.     multivitamin with minerals Tabs tablet  Take 1 tablet by mouth at bedtime. Centrum Silver     nitroGLYCERIN 0.4 MG SL tablet  Commonly known as:  NITROSTAT  Place 1 tablet (0.4  mg total) under the tongue every 5 (five) minutes as needed for chest pain (up to 3 doses).     OCUVITE PO  Take 1 tablet by mouth daily.     traMADol 50 MG tablet  Commonly known as:  ULTRAM  Take 50 mg by mouth every 6 (six) hours as needed for moderate pain.       Follow-up Information   Follow up with Meredith Staggers, MD On 06/03/2014. (Be there at 10:30 for 11 am  appointment)    Specialty:  Physical Medicine and Rehabilitation   Contact information:   [address], [place] [address] [phone]       Follow up with Chauncey Cruel, MD. Call today. (for follow up appointment)    Specialty:  Oncology   Contact information:   [place] [address] [phone]       Follow up with Jenny Reichmann, MD. [facility] IN-NO APPOINTMENT NEEDED)    Specialty:  Family Medicine   Contact information:    [address] [place] [address] [phone]       Signed: Bary Leriche 04/15/2014, 6:49 PM

## 2014-04-07 NOTE — Progress Notes (Signed)
Social Work  Discharge Note  The overall goal for the admission was met for:   Discharge location: Yes - home with husband who can provide 24/7 assist  Length of Stay: Yes  -7 days  Discharge activity level: Yes - modified independent overall  Home/community participation: Yes  Services provided included: MD, RD, PT, OT, RN, TR, Pharmacy and [place]: Medicare and Private Insurance: [place]  Follow-up services arranged: Home Health: PT, OT via [facility], DME: 18x16 lightweight w/c, cushion, rolling walker and 3n1 commode via [place] and Patient/Family request agency HH: [place], DME: none  Comments (or additional information):  Patient/Family verbalized understanding of follow-up arrangements: Yes  Individual responsible for coordination of the follow-up plan: patient  Confirmed correct DME delivered: Lennart Pall 04/07/2014    Lennart Pall

## 2014-04-08 ENCOUNTER — Other Ambulatory Visit: Payer: Self-pay | Admitting: *Deleted

## 2014-04-08 ENCOUNTER — Other Ambulatory Visit: Payer: Self-pay | Admitting: Physician Assistant

## 2014-04-08 ENCOUNTER — Encounter: Payer: Self-pay | Admitting: *Deleted

## 2014-04-08 ENCOUNTER — Ambulatory Visit (HOSPITAL_BASED_OUTPATIENT_CLINIC_OR_DEPARTMENT_OTHER): Payer: Medicare Other

## 2014-04-08 ENCOUNTER — Ambulatory Visit (INDEPENDENT_AMBULATORY_CARE_PROVIDER_SITE_OTHER): Payer: Medicare Other | Admitting: General Practice

## 2014-04-08 ENCOUNTER — Ambulatory Visit (HOSPITAL_COMMUNITY)
Admission: RE | Admit: 2014-04-08 | Discharge: 2014-04-08 | Disposition: A | Discharge status: Home / Self Care | Payer: Medicare Other | Source: Ambulatory Visit | Attending: Oncology | Admitting: Oncology

## 2014-04-08 ENCOUNTER — Other Ambulatory Visit (HOSPITAL_BASED_OUTPATIENT_CLINIC_OR_DEPARTMENT_OTHER): Payer: Medicare Other

## 2014-04-08 VITALS — BP 161/90 | HR 94 | Temp 98.2°F | Resp 18 | Wt 182.0 lb

## 2014-04-08 DIAGNOSIS — I2699 Other pulmonary embolism without acute cor pulmonale: Secondary | ICD-10-CM

## 2014-04-08 DIAGNOSIS — D649 Anemia, unspecified: Secondary | ICD-10-CM

## 2014-04-08 DIAGNOSIS — Z5181 Encounter for therapeutic drug level monitoring: Secondary | ICD-10-CM

## 2014-04-08 DIAGNOSIS — C9000 Multiple myeloma not having achieved remission: Secondary | ICD-10-CM | POA: Insufficient documentation

## 2014-04-08 LAB — COMPREHENSIVE METABOLIC PANEL (CC13)
ALK PHOS: 65 U/L (ref 40–150)
ALT: 8 U/L (ref 0–55)
ANION GAP: 13 meq/L — AB (ref 3–11)
AST: 41 U/L — ABNORMAL HIGH (ref 5–34)
Albumin: 2.2 g/dL — ABNORMAL LOW (ref 3.5–5.0)
BILIRUBIN TOTAL: 0.31 mg/dL (ref 0.20–1.20)
BUN: 28.4 mg/dL — AB (ref 7.0–26.0)
CO2: 21 mEq/L — ABNORMAL LOW (ref 22–29)
CREATININE: 1.6 mg/dL — AB (ref 0.6–1.1)
Calcium: 9.8 mg/dL (ref 8.4–10.4)
Chloride: 103 mEq/L (ref 98–109)
GLUCOSE: 166 mg/dL — AB (ref 70–140)
Potassium: 5.3 mEq/L — ABNORMAL HIGH (ref 3.5–5.1)
SODIUM: 137 meq/L (ref 136–145)
Total Protein: 11.7 g/dL — ABNORMAL HIGH (ref 6.4–8.3)

## 2014-04-08 LAB — HOLD TUBE, BLOOD BANK

## 2014-04-08 LAB — BETA 2 MICROGLOBULIN, SERUM: Beta-2 Microglobulin: 15.8 mg/L — ABNORMAL HIGH (ref <=2.51)

## 2014-04-08 LAB — LACTATE DEHYDROGENASE (CC13): LDH: 193 U/L (ref 125–245)

## 2014-04-08 MED ORDER — DIPHENHYDRAMINE HCL 25 MG PO CAPS
25.0000 mg | ORAL_CAPSULE | Freq: Once | ORAL | Status: AC
  Administered 2014-04-08: 25 mg via ORAL

## 2014-04-08 MED ORDER — ACETAMINOPHEN 325 MG PO TABS
650.0000 mg | ORAL_TABLET | Freq: Once | ORAL | Status: AC
  Administered 2014-04-08: 650 mg via ORAL

## 2014-04-08 MED ORDER — SODIUM CHLORIDE 0.9 % IV SOLN
250.0000 mL | Freq: Once | INTRAVENOUS | Status: AC
  Administered 2014-04-08: 250 mL via INTRAVENOUS

## 2014-04-08 MED ORDER — ACETAMINOPHEN 325 MG PO TABS
ORAL_TABLET | ORAL | Status: AC
  Filled 2014-04-08: qty 2

## 2014-04-08 MED ORDER — DIPHENHYDRAMINE HCL 25 MG PO CAPS
ORAL_CAPSULE | ORAL | Status: AC
  Filled 2014-04-08: qty 1

## 2014-04-08 MED ORDER — SODIUM CHLORIDE 0.9 % IJ SOLN
3.0000 mL | INTRAMUSCULAR | Status: DC | PRN
  Filled 2014-04-08: qty 3

## 2014-04-08 NOTE — Telephone Encounter (Signed)
Multiple attempts and messages left yesterday and this morning for patient on cell and home numbers to confirm appts for 4/22. Call back from daughter-in-law, she will go to patient home and contact us from there. She is not sure why patient has not returned our call, patient did have another appt today at 1000am.

## 2014-04-08 NOTE — Progress Notes (Signed)
Social Work Patient ID: Steele Berg, female   DOB: 1946-01-01, 68 y.o.   MRN: [id number]   Lennart Pall, LCSW Social Worker Signed  Patient Care Conference Service date: 04/08/2014 9:45 AM  Inpatient RehabilitationTeam Conference and Plan of Care Update Date: 04/07/2014   Time: 1:55 PM     Patient Name: [name]       Medical Record Number: [id number]   Date of Birth: 1946-03-21 Sex: Female         Room/Bed: 4M04C/4M04C-01 Payor Info: Payor: MEDICARE / Plan: MEDICARE PART A AND B / Product Type: *No Product type* /   Admitting Diagnosis: deconditioned with l1 comp fx   Admit Date/Time:  03/31/2014  4:05 PM Admission Comments: No comment available   Primary Diagnosis:  <principal problem not specified> Principal Problem: <principal problem not specified>    Patient Active Problem List     Diagnosis  Date Noted   .  Multiple myeloma, without mention of having achieved remission  04/07/2014   .  Physical deconditioning  03/31/2014   .  Hypercalcemia  03/25/2014   .  Major hemoptysis  03/25/2014   .  Acute blood loss anemia  03/25/2014   .  Vertebral fracture, closed  03/25/2014   .  Hemoptysis  03/24/2014   .  Pneumonia, organism unspecified  03/24/2014   .  Encounter for therapeutic drug monitoring  01/14/2014   .  Fe deficiency anemia  12/31/2013   .  Long term (current) use of anticoagulants  09/24/2013   .  Pulmonary embolism  09/18/2013   .  Acute pulmonary embolism  09/17/2013   .  Hypoxemia  09/17/2013   .  Onychomycosis  04/29/2013   .  Pain in joint, ankle and foot  04/29/2013   .  Benign hypertensive heart disease without heart failure  10/15/2012   .  STEMI (ST elevation myocardial infarction)  05/30/2012   .  Dyspnea  11/15/2011   .  Herpes zoster  07/17/2011   .  Fatigue  03/10/2011   .  Chest pain at rest  03/10/2011   .  Acute sinusitis, unspecified  03/10/2011   .  Neurofibromatosis     .  Type II or unspecified type diabetes mellitus without  mention of complication, uncontrolled     .  HTN (hypertension)     .  Obesity     .  Osteoarthritis, knee       Expected Discharge Date: Expected Discharge Date: 04/07/14  Team Members Present: Physician leading conference: Dr. Alger Simons Social Worker Present: Lennart Pall, LCSW Nurse Present: Nanine Means, RN PT Present: Melene Plan, PT OT Present: Other (comment) Debroah Baller, OT) [place] Coordinator present : Daiva Nakayama, RN, CRRN        Current Status/Progress  Goal  Weekly Team Focus   Medical     deconditioning/ L1 compression fx---newly diagnosed multiple myeloma  increase activity tolerance, contrl pain  pain mgt, education about MM, increase respiratory stamina   Bowel/Bladder     Continent of bowel and bladder; LBM 4/20  Continent of bowel and bladder  Remain continent of bowel and bladder by offering bathroom PRN   Swallow/Nutrition/ Hydration            ADL's     supervision for set up with basic self care skills  supervision  activity tolerance, safety, functional mobility   Mobility     mod I overall; supervision for car transfers and stair  negotiation  mod I overall; supervision for car transfers and stair negotiation  safety, strengthening, functional mobility, balance, coordination   Communication            Safety/Cognition/ Behavioral Observations           Pain     Pain in back managed with scheduled ultram and lidocaine patch  </=3  Assess pain qshift and PRN   Skin     Intact  No new skin breakdown  Assure patient is turning q2hr in bed or q1hr in chair    Rehab Goals Patient on target to meet rehab goals: Yes *See Care Plan and progress notes for long and short-term goals.    Barriers to Discharge:  stamina     Possible Resolutions to Barriers:    pacing, strenth and stamina training, husband can help      Discharge Planning/Teaching Needs:    home with husband able to provide 24/7 assistance      Team Discussion:    Reaching mod i  goals and family education complete. Ready for d/c today.   Revisions to Treatment Plan:    None    Continued Need for Acute Rehabilitation Level of Care: The patient requires daily medical management by a physician with specialized training in physical medicine and rehabilitation for the following conditions: Daily direction of a multidisciplinary physical rehabilitation program to ensure safe treatment while eliciting the highest outcome that is of practical value to the patient.: Yes Daily medical management of patient stability for increased activity during participation in an intensive rehabilitation regime.: Yes Daily analysis of laboratory values and/or radiology reports with any subsequent need for medication adjustment of medical intervention for : Pulmonary problems;Cardiac problems;Other  Lennart Pall 04/08/2014, 11:27 AM

## 2014-04-08 NOTE — Patient Instructions (Signed)
Blood Transfusion Information WHAT IS A BLOOD TRANSFUSION? A transfusion is the replacement of blood or some of its parts. Blood is made up of multiple cells which provide different functions.  Red blood cells carry oxygen and are used for blood loss replacement.  White blood cells fight against infection.  Platelets control bleeding.  Plasma helps clot blood.  Other blood products are available for specialized needs, such as hemophilia or other clotting disorders. BEFORE THE TRANSFUSION  Who gives blood for transfusions?   You may be able to donate blood to be used at a later date on yourself (autologous donation).  Relatives can be asked to donate blood. This is generally not any safer than if you have received blood from a stranger. The same precautions are taken to ensure safety when a relative's blood is donated.  Healthy volunteers who are fully evaluated to make sure their blood is safe. This is blood bank blood. Transfusion therapy is the safest it has ever been in the practice of medicine. Before blood is taken from a donor, a complete history is taken to make sure that person has no history of diseases nor engages in risky social behavior (examples are intravenous drug use or sexual activity with multiple partners). The donor's travel history is screened to minimize risk of transmitting infections, such as malaria. The donated blood is tested for signs of infectious diseases, such as HIV and hepatitis. The blood is then tested to be sure it is compatible with you in order to minimize the chance of a transfusion reaction. If you or a relative donates blood, this is often done in anticipation of surgery and is not appropriate for emergency situations. It takes many days to process the donated blood. RISKS AND COMPLICATIONS Although transfusion therapy is very safe and saves many lives, the main dangers of transfusion include:   Getting an infectious disease.  Developing a  transfusion reaction. This is an allergic reaction to something in the blood you were given. Every precaution is taken to prevent this. The decision to have a blood transfusion has been considered carefully by your caregiver before blood is given. Blood is not given unless the benefits outweigh the risks. AFTER THE TRANSFUSION  Right after receiving a blood transfusion, you will usually feel much better and more energetic. This is especially true if your red blood cells have gotten low (anemic). The transfusion raises the level of the red blood cells which carry oxygen, and this usually causes an energy increase.  The nurse administering the transfusion will monitor you carefully for complications. HOME CARE INSTRUCTIONS  No special instructions are needed after a transfusion. You may find your energy is better. Speak with your caregiver about any limitations on activity for underlying diseases you may have. SEEK MEDICAL CARE IF:   Your condition is not improving after your transfusion.  You develop redness or irritation at the intravenous (IV) site. SEEK IMMEDIATE MEDICAL CARE IF:  Any of the following symptoms occur over the next 12 hours:  Shaking chills.  You have a temperature by mouth above 102 F (38.9 C), not controlled by medicine.  Chest, back, or muscle pain.  People around you feel you are not acting correctly or are confused.  Shortness of breath or difficulty breathing.  Dizziness and fainting.  You get a rash or develop hives.  You have a decrease in urine output.  Your urine turns a dark color or changes to pink, red, or brown. Any of the following   symptoms occur over the next 10 days:  You have a temperature by mouth above 102 F (38.9 C), not controlled by medicine.  Shortness of breath.  Weakness after normal activity.  The white part of the eye turns yellow (jaundice).  You have a decrease in the amount of urine or are urinating less often.  Your  urine turns a dark color or changes to pink, red, or brown. Document Released: 12/01/2000 Document Revised: 02/26/2012 Document Reviewed: 07/20/2008 ExitCare Patient Information 2014 [employer].  

## 2014-04-08 NOTE — Patient Care Conference (Signed)
Inpatient RehabilitationTeam Conference and Plan of Care Update Date: 04/07/2014   Time: 1:55 PM    Patient Name: [name]      Medical Record Number: [id number]  Date of Birth: 12/15/1946 Sex: Female         Room/Bed: 4M04C/4M04C-01 Payor Info: Payor: MEDICARE / Plan: MEDICARE PART A AND B / Product Type: *No Product type* /    Admitting Diagnosis: deconditioned with l1 comp fx  Admit Date/Time:  03/31/2014  4:05 PM Admission Comments: No comment available   Primary Diagnosis:  <principal problem not specified> Principal Problem: <principal problem not specified>  Patient Active Problem List   Diagnosis Date Noted  . Multiple myeloma, without mention of having achieved remission 04/07/2014  . Physical deconditioning 03/31/2014  . Hypercalcemia 03/25/2014  . Major hemoptysis 03/25/2014  . Acute blood loss anemia 03/25/2014  . Vertebral fracture, closed 03/25/2014  . Hemoptysis 03/24/2014  . Pneumonia, organism unspecified 03/24/2014  . Encounter for therapeutic drug monitoring 01/14/2014  . Fe deficiency anemia 12/31/2013  . Long term (current) use of anticoagulants 09/24/2013  . Pulmonary embolism 09/18/2013  . Acute pulmonary embolism 09/17/2013  . Hypoxemia 09/17/2013  . Onychomycosis 04/29/2013  . Pain in joint, ankle and foot 04/29/2013  . Benign hypertensive heart disease without heart failure 10/15/2012  . STEMI (ST elevation myocardial infarction) 05/30/2012  . Dyspnea 11/15/2011  . Herpes zoster 07/17/2011  . Fatigue 03/10/2011  . Chest pain at rest 03/10/2011  . Acute sinusitis, unspecified 03/10/2011  . Neurofibromatosis   . Type II or unspecified type diabetes mellitus without mention of complication, uncontrolled   . HTN (hypertension)   . Obesity   . Osteoarthritis, knee     Expected Discharge Date: Expected Discharge Date: 04/07/14  Team Members Present: Physician leading conference: Dr. Alger Simons Social Worker Present: Lennart Pall,  LCSW Nurse Present: Nanine Means, RN PT Present: Melene Plan, PT OT Present: Other (comment) Debroah Baller, OT) [place] Coordinator present : Daiva Nakayama, RN, CRRN     Current Status/Progress Goal Weekly Team Focus  Medical   deconditioning/ L1 compression fx---newly diagnosed multiple myeloma  increase activity tolerance, contrl pain  pain mgt, education about MM, increase respiratory stamina   Bowel/Bladder   Continent of bowel and bladder; LBM 4/20  Continent of bowel and bladder  Remain continent of bowel and bladder by offering bathroom PRN   Swallow/Nutrition/ Hydration             ADL's   supervision for set up with basic self care skills  supervision  activity tolerance, safety, functional mobility   Mobility   mod I overall; supervision for car transfers and stair negotiation  mod I overall; supervision for car transfers and stair negotiation  safety, strengthening, functional mobility, balance, coordination   Communication             Safety/Cognition/ Behavioral Observations            Pain   Pain in back managed with scheduled ultram and lidocaine patch  </=3  Assess pain qshift and PRN   Skin   Intact  No new skin breakdown  Assure patient is turning q2hr in bed or q1hr in chair    Rehab Goals Patient on target to meet rehab goals: Yes *See Care Plan and progress notes for long and short-term goals.  Barriers to Discharge: stamina    Possible Resolutions to Barriers:  pacing, strenth and stamina training, husband can help    Discharge Planning/Teaching  Needs:  home with husband able to provide 24/7 assistance      Team Discussion:  Reaching mod i goals and family education complete. Ready for d/c today.  Revisions to Treatment Plan:  None   Continued Need for Acute Rehabilitation Level of Care: The patient requires daily medical management by a physician with specialized training in physical medicine and rehabilitation for the following  conditions: Daily direction of a multidisciplinary physical rehabilitation program to ensure safe treatment while eliciting the highest outcome that is of practical value to the patient.: Yes Daily medical management of patient stability for increased activity during participation in an intensive rehabilitation regime.: Yes Daily analysis of laboratory values and/or radiology reports with any subsequent need for medication adjustment of medical intervention for : Pulmonary problems;Cardiac problems;Other  Lennart Pall 04/08/2014, 11:27 AM

## 2014-04-09 ENCOUNTER — Emergency Department (HOSPITAL_COMMUNITY)
Admission: EM | Admit: 2014-04-09 | Discharge: 2014-04-09 | Disposition: A | Discharge status: Home / Self Care | Payer: Medicare Other | Attending: Emergency Medicine | Admitting: Emergency Medicine

## 2014-04-09 ENCOUNTER — Ambulatory Visit (HOSPITAL_COMMUNITY)
Admission: RE | Admit: 2014-04-09 | Discharge: 2014-04-09 | Disposition: A | Discharge status: Home / Self Care | Payer: Medicare Other | Source: Ambulatory Visit | Attending: Oncology | Admitting: Oncology

## 2014-04-09 ENCOUNTER — Other Ambulatory Visit: Payer: Self-pay | Admitting: *Deleted

## 2014-04-09 ENCOUNTER — Other Ambulatory Visit: Payer: Self-pay | Admitting: Oncology

## 2014-04-09 ENCOUNTER — Telehealth: Payer: Self-pay | Admitting: *Deleted

## 2014-04-09 ENCOUNTER — Ambulatory Visit (HOSPITAL_COMMUNITY): Admission: RE | Admit: 2014-04-09 | Payer: Medicare Other | Source: Ambulatory Visit

## 2014-04-09 ENCOUNTER — Encounter (HOSPITAL_COMMUNITY): Payer: Self-pay | Admitting: Emergency Medicine

## 2014-04-09 DIAGNOSIS — I10 Essential (primary) hypertension: Secondary | ICD-10-CM | POA: Insufficient documentation

## 2014-04-09 DIAGNOSIS — Z8669 Personal history of other diseases of the nervous system and sense organs: Secondary | ICD-10-CM | POA: Insufficient documentation

## 2014-04-09 DIAGNOSIS — Z87440 Personal history of urinary (tract) infections: Secondary | ICD-10-CM | POA: Insufficient documentation

## 2014-04-09 DIAGNOSIS — R040 Epistaxis: Secondary | ICD-10-CM | POA: Insufficient documentation

## 2014-04-09 DIAGNOSIS — Z7982 Long term (current) use of aspirin: Secondary | ICD-10-CM | POA: Insufficient documentation

## 2014-04-09 DIAGNOSIS — D649 Anemia, unspecified: Secondary | ICD-10-CM

## 2014-04-09 DIAGNOSIS — E669 Obesity, unspecified: Secondary | ICD-10-CM | POA: Insufficient documentation

## 2014-04-09 DIAGNOSIS — Z86711 Personal history of pulmonary embolism: Secondary | ICD-10-CM | POA: Insufficient documentation

## 2014-04-09 DIAGNOSIS — E860 Dehydration: Secondary | ICD-10-CM

## 2014-04-09 DIAGNOSIS — E119 Type 2 diabetes mellitus without complications: Secondary | ICD-10-CM | POA: Insufficient documentation

## 2014-04-09 DIAGNOSIS — C9000 Multiple myeloma not having achieved remission: Secondary | ICD-10-CM

## 2014-04-09 DIAGNOSIS — M1710 Unilateral primary osteoarthritis, unspecified knee: Secondary | ICD-10-CM | POA: Insufficient documentation

## 2014-04-09 DIAGNOSIS — R05 Cough: Secondary | ICD-10-CM | POA: Insufficient documentation

## 2014-04-09 DIAGNOSIS — I252 Old myocardial infarction: Secondary | ICD-10-CM | POA: Insufficient documentation

## 2014-04-09 DIAGNOSIS — R059 Cough, unspecified: Secondary | ICD-10-CM | POA: Insufficient documentation

## 2014-04-09 DIAGNOSIS — Z9889 Other specified postprocedural states: Secondary | ICD-10-CM | POA: Insufficient documentation

## 2014-04-09 DIAGNOSIS — Z79899 Other long term (current) drug therapy: Secondary | ICD-10-CM | POA: Insufficient documentation

## 2014-04-09 DIAGNOSIS — IMO0002 Reserved for concepts with insufficient information to code with codable children: Secondary | ICD-10-CM | POA: Insufficient documentation

## 2014-04-09 LAB — COMPREHENSIVE METABOLIC PANEL
ALK PHOS: 61 U/L (ref 39–117)
ALT: 7 U/L (ref 0–35)
AST: 24 U/L (ref 0–37)
Albumin: 2.1 g/dL — ABNORMAL LOW (ref 3.5–5.2)
BUN: 31 mg/dL — ABNORMAL HIGH (ref 6–23)
CALCIUM: 9.6 mg/dL (ref 8.4–10.5)
CO2: 22 meq/L (ref 19–32)
Chloride: 97 mEq/L (ref 96–112)
Creatinine, Ser: 1.53 mg/dL — ABNORMAL HIGH (ref 0.50–1.10)
GFR calc Af Amer: 40 mL/min — ABNORMAL LOW (ref >90)
GFR calc non Af Amer: 34 mL/min — ABNORMAL LOW (ref >90)
Glucose, Bld: 152 mg/dL — ABNORMAL HIGH (ref 70–99)
POTASSIUM: 4.9 meq/L (ref 3.7–5.3)
SODIUM: 137 meq/L (ref 137–147)
Total Bilirubin: 0.4 mg/dL (ref 0.3–1.2)
Total Protein: 11.1 g/dL — ABNORMAL HIGH (ref 6.0–8.3)

## 2014-04-09 LAB — CBC WITH DIFFERENTIAL/PLATELET
BASOS ABS: 0.1 10*3/uL (ref 0.0–0.1)
Basophils Relative: 1 % (ref 0–1)
EOS ABS: 0.2 10*3/uL (ref 0.0–0.7)
EOS PCT: 2 % (ref 0–5)
HCT: 27.8 % — ABNORMAL LOW (ref 36.0–46.0)
Hemoglobin: 9 g/dL — ABNORMAL LOW (ref 12.0–15.0)
LYMPHS ABS: 1.8 10*3/uL (ref 0.7–4.0)
Lymphocytes Relative: 19 % (ref 12–46)
MCH: 27.4 pg (ref 26.0–34.0)
MCHC: 32.4 g/dL (ref 30.0–36.0)
MCV: 84.8 fL (ref 78.0–100.0)
MONO ABS: 1 10*3/uL (ref 0.1–1.0)
Monocytes Relative: 10 % (ref 3–12)
Neutro Abs: 6.6 10*3/uL (ref 1.7–7.7)
Neutrophils Relative %: 68 % (ref 43–77)
PLATELETS: 175 10*3/uL (ref 150–400)
RBC: 3.28 MIL/uL — ABNORMAL LOW (ref 3.87–5.11)
RDW: 22.8 % — AB (ref 11.5–15.5)
WBC: 9.7 10*3/uL (ref 4.0–10.5)

## 2014-04-09 LAB — PROTIME-INR
INR: 1.31 (ref 0.00–1.49)
Prothrombin Time: 16 seconds — ABNORMAL HIGH (ref 11.6–15.2)

## 2014-04-09 MED ORDER — OXYMETAZOLINE HCL 0.05 % NA SOLN
1.0000 | Freq: Once | NASAL | Status: AC
  Administered 2014-04-09: 1 via NASAL

## 2014-04-09 MED ORDER — AMOXICILLIN-POT CLAVULANATE 875-125 MG PO TABS: 1.0000 | ORAL_TABLET | Freq: Two times a day (BID) | ORAL | Status: DC

## 2014-04-09 MED ORDER — SODIUM CHLORIDE 0.9 % IV BOLUS (SEPSIS)
1000.0000 mL | INTRAVENOUS | Status: AC
  Administered 2014-04-09: 1000 mL via INTRAVENOUS

## 2014-04-09 MED ORDER — SODIUM CHLORIDE 0.45 % IV SOLN
INTRAVENOUS | Status: DC
  Filled 2014-04-09: qty 1000

## 2014-04-09 NOTE — ED Notes (Signed)
Pt coughed up blood clot during IV start and afterwords started having some bleeding. MD at bedside.

## 2014-04-09 NOTE — ED Notes (Signed)
Aline Brochure MD at bedside with Afrin and ENT cart.

## 2014-04-09 NOTE — Progress Notes (Signed)
Still having issues with chart/orders from yesterday, re-entering transfusion orders for 1 unit PRBC

## 2014-04-09 NOTE — ED Provider Notes (Signed)
CSN: [id number]     Arrival date & time 04/09/14  1145 History   First MD Initiated Contact with Patient 04/09/14 1156     Chief Complaint  Patient presents with  . Epistaxis     (Consider location/radiation/quality/duration/timing/severity/associated sxs/prior Treatment) Patient is a 68 y.o. female presenting with nosebleeds. The history is provided by the patient.  Epistaxis Location:  Bilateral Severity:  Mild Duration:  30 minutes Timing:  Constant Progression:  Unchanged Chronicity:  New Context comment:  At rest Relieved by:  Nothing Worsened by:  Nothing tried Ineffective treatments:  None tried Associated symptoms: cough (mild)   Associated symptoms: no congestion, no dizziness, no fever and no headaches     Past Medical History  Diagnosis Date  . Neurofibromatosis   . Diabetes mellitus   . HTN (hypertension)   . Obesity   . Osteoarthritis, knee   . Hyperlipidemia     statin intolerant. LDL is 83  . UTI (lower urinary tract infection) 05/29/12  . 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 Pulmonary embolism [id number]   Past Surgical History  Procedure Laterality Date  . Cardiac catheterization    . Tonsillectomy and adenoidectomy    . Clavicle surgery    . Abdominal hysterectomy    . Ankle fracture surgery Right   . Total knee arthroplasty Left 09/15/2013    Procedure: TOTAL KNEE ARTHROPLASTY;  Surgeon: Vickey Huger, MD;  Location: [facility];  Service: Orthopedics;  Laterality: Left;   Family History  Problem Relation Age of Onset  . Heart disease Mother   . Arthritis Mother   . Stroke Mother   . Heart disease Son    History  Substance Use Topics  . Smoking status: Never Smoker   . Smokeless tobacco: Never Used  . Alcohol Use: No   OB History   Grav Para Term Preterm Abortions TAB SAB Ect Mult Living                 Review of Systems  Constitutional: Negative for fever and fatigue.  HENT:  Positive for nosebleeds. Negative for congestion and drooling.   Eyes: Negative for pain.  Respiratory: Positive for cough (mild). Negative for shortness of breath.   Cardiovascular: Negative for chest pain.  Gastrointestinal: Negative for nausea, vomiting, abdominal pain and diarrhea.  Genitourinary: Negative for dysuria and hematuria.  Musculoskeletal: Negative for back pain, gait problem and neck pain.  Skin: Negative for color change.  Neurological: Negative for dizziness and headaches.  Hematological: Negative for adenopathy.  Psychiatric/Behavioral: Negative for behavioral problems.  All other systems reviewed and are negative.     Allergies  Codeine; Hydrocodone; Niaspan; Robaxin; Statins; Tetracycline; Zetia; and Zithromax  Home Medications   Prior to Admission medications   Medication Sig Start Date End Date Taking? Authorizing Provider  acetaminophen (TYLENOL) 325 MG tablet Take 1-2 tablets (325-650 mg total) by mouth every 4 (four) hours as needed for mild pain. 04/07/14   Bary Leriche, PA-C  aspirin EC 81 MG tablet Take 81 mg by mouth at bedtime.    Historical Provider, MD  fluconazole (DIFLUCAN) 100 MG tablet Take 1 tablet (100 mg total) by mouth daily. 04/08/14   Bary Leriche, PA-C  glimepiride (AMARYL) 2 MG tablet Take 1 tablet (2 mg total) by mouth daily with breakfast. 04/07/14   Ivan Anchors Love, PA-C  lidocaine (LIDODERM) 5 % Place on lower back at  7 am and remove at 7 pm daily. 04/07/14   Bary Leriche, PA-C  Menthol-Methyl Salicylate (MUSCLE RUB EX) Apply 1 application topically 3 (three) times daily as needed (back pain).    Historical Provider, MD  methocarbamol (ROBAXIN) 500 MG tablet Take 1 tablet (500 mg total) by mouth every 6 (six) hours as needed for muscle spasms. 04/07/14   Bary Leriche, PA-C  metoprolol tartrate (LOPRESSOR) 25 MG tablet Take 1 tablet (25 mg total) by mouth 2 (two) times daily. 04/07/14   Bary Leriche, PA-C  Multiple Vitamin (MULTIVITAMIN WITH  MINERALS) TABS tablet Take 1 tablet by mouth at bedtime. Centrum Silver    Historical Provider, MD  Multiple Vitamins-Minerals (OCUVITE PO) Take 1 tablet by mouth daily.     Historical Provider, MD  nitroGLYCERIN (NITROSTAT) 0.4 MG SL tablet Place 1 tablet (0.4 mg total) under the tongue every 5 (five) minutes as needed for chest pain (up to 3 doses). 07/01/13   Darlin Coco, MD  nystatin (MYCOSTATIN/NYSTOP) 100000 UNIT/GM POWD Apply 1 g topically 3 (three) times daily. 04/07/14   Ivan Anchors Love, PA-C  polyethylene glycol (MIRALAX / GLYCOLAX) packet Take 17 g by mouth daily. 03/31/14   Langston Masker, MD  senna-docusate (SENOKOT-S) 8.6-50 MG per tablet Take 2 tablets by mouth at bedtime. For constipation. Is available over the counter 04/07/14   Ivan Anchors Love, PA-C  traMADol (ULTRAM) 50 MG tablet Take 50-100 mg by mouth every 4 (four) hours as needed (pain).  03/18/14   Historical Provider, MD  traMADol (ULTRAM) 50 MG tablet Take one pill daily to start the day. Then may use one pill every 6 hours as needed for pain. 04/07/14   Ivan Anchors Love, PA-C   BP 167/85  Pulse 93  Temp(Src) 98.1 F (36.7 C) (Oral)  Resp 18  SpO2 95% Physical Exam  Nursing note and vitals reviewed. Constitutional: She is oriented to person, place, and time. She appears well-developed and well-nourished.  HENT:  Head: Normocephalic.  Mouth/Throat: No oropharyngeal exudate.  No obvious source of bleeding with visualization of the anterior nares. Bleeding seems to be worse from the right nare.   Slow trickle of blood seen in the posterior oropharynx.  Eyes: Conjunctivae and EOM are normal. Pupils are equal, round, and reactive to light.  Neck: Normal range of motion. Neck supple.  Cardiovascular: Normal rate, regular rhythm, normal heart sounds and intact distal pulses.  Exam reveals no gallop and no friction rub.   No murmur heard. Pulmonary/Chest: Effort normal and breath sounds normal. No respiratory distress. She has no  wheezes.  Abdominal: Soft. Bowel sounds are normal. There is no tenderness. There is no rebound and no guarding.  Musculoskeletal: Normal range of motion. She exhibits no edema and no tenderness.  Neurological: She is alert and oriented to person, place, and time.  Skin: Skin is warm and dry.  Psychiatric: She has a normal mood and affect. Her behavior is normal.    ED Course  Procedures (including critical care time) Labs Review Labs Reviewed  CBC WITH DIFFERENTIAL - Abnormal; Notable for the following:    RBC 3.28 (*)    Hemoglobin 9.0 (*)    HCT 27.8 (*)    RDW 22.8 (*)    All other components within normal limits  PROTIME-INR - Abnormal; Notable for the following:    Prothrombin Time 16.0 (*)    All other components within normal limits  COMPREHENSIVE METABOLIC PANEL - Abnormal; Notable for  the following:    Glucose, Bld 152 (*)    BUN 31 (*)    Creatinine, Ser 1.53 (*)    Total Protein 11.1 (*)    Albumin 2.1 (*)    GFR calc non Af Amer 34 (*)    GFR calc Af Amer 40 (*)    All other components within normal limits    Imaging Review No results found.   EKG Interpretation None      MDM   Final diagnoses:  Epistaxis    12:08 PM 68 y.o. female w hx of multiple myeloma s/p blood transfusion yesterday who pw epistaxis. The patient has a history of PE and is on Coumadin previously. She was taken off approximately 2-3 weeks ago after having hemoptysis. She states that she began bleeding from bilateral nares approximately 30 minutes ago while sitting on the couch. She denies any trauma. She states that she has been well otherwise. She is afebrile and vital signs are unremarkable here. Will attempt compression and Afrin.  Place an Ant/Post rhinrocket to right nare to stop bleeding after multiple attempts w/ compression/afrin. Bleeding now controlled. I called Dr. Noreene Filbert office and arranged f/u for her next Tuesday. Will place on augmentin.   3:48 PM: Cr mildly elevated  here. Pt s/p 1L IVF. She continues to appear well. Hgb stable.   I have discussed the diagnosis/risks/treatment options with the patient and family and believe the pt to be eligible for discharge home to follow-up tomorrow for bm bx as scheduled. We also discussed returning to the ED immediately if new or worsening sx occur. We discussed the sx which are most concerning (e.g., return or worsening bleeding not treated w/ conservative measures, fever, dizziness, syncope, lightheadedness) that necessitate immediate return. Medications administered to the patient during their visit and any new prescriptions provided to the patient are listed below.  Medications given during this visit Medications  oxymetazoline (AFRIN) 0.05 % nasal spray 1 spray (1 spray Each Nare Given by Other 04/09/14 1222)  sodium chloride 0.9 % bolus 1,000 mL (0 mLs Intravenous Stopped 04/09/14 1513)    New Prescriptions   AMOXICILLIN-CLAVULANATE (AUGMENTIN) 875-125 MG PER TABLET    Take 1 tablet by mouth 2 (two) times daily. One po bid x 7 days     Blanchard Kelch, MD 04/09/14 2049

## 2014-04-09 NOTE — ED Notes (Addendum)
Pt presents with nosebleed starting at 1130. Pt seen at [facility] yesterday for blood transfusion. Pt was on way to cancer center for additional transfusion today when told to come to ED for nosebleed.   Pt was on coumadin but has been taken off 2 weeks ago.

## 2014-04-09 NOTE — Telephone Encounter (Signed)
Rec'd call back from sister-in-law, Ursula Alert, she is at home at [phone], cell [phone] and just spoke to patient and they are in route to [facility], nose still bleeding. We will continue to monitor patient status at this visit.

## 2014-04-09 NOTE — Discharge Instructions (Signed)

## 2014-04-09 NOTE — Telephone Encounter (Signed)
PT. IS VERY UPSET AND IT IS DIFFICULT TO UNDERSTAND PT. SHE STATES THE NOSE BLEED JUST STARTED AND IT IS A CONSTANT DRIP. PT. SOUNDS SHORT OF BREATH. SHE STATES THE BLOOD IS COMING OUT OF HER MOUTH. INSTRUCTED PT. TO GO TO THE EMERGENCY ROOM TO BE EVALUATED. SHE VOICES UNDERSTANDING. NOTIFIED DR.MAGRINAT'S NURSE, AMY MITCHELL,RN.

## 2014-04-09 NOTE — Telephone Encounter (Signed)
1200-Called and spoke to charge RN, Junious Silk in [place], gave status update of current plan for today of Bone Scan and additional blood transfusion (iunit PRBCs) with IVF's that was planned here at [facility] today. Will notify MD of change in patient status for any potential change in plan.

## 2014-04-10 ENCOUNTER — Other Ambulatory Visit: Payer: Self-pay

## 2014-04-10 ENCOUNTER — Telehealth: Payer: Self-pay | Admitting: *Deleted

## 2014-04-10 ENCOUNTER — Other Ambulatory Visit (HOSPITAL_BASED_OUTPATIENT_CLINIC_OR_DEPARTMENT_OTHER): Payer: Medicare Other

## 2014-04-10 ENCOUNTER — Ambulatory Visit (HOSPITAL_BASED_OUTPATIENT_CLINIC_OR_DEPARTMENT_OTHER): Payer: Medicare Other | Admitting: Oncology

## 2014-04-10 ENCOUNTER — Other Ambulatory Visit: Payer: Self-pay | Admitting: *Deleted

## 2014-04-10 VITALS — BP 162/82 | HR 96 | Temp 98.0°F | Resp 18

## 2014-04-10 DIAGNOSIS — M8448XA Pathological fracture, other site, initial encounter for fracture: Secondary | ICD-10-CM

## 2014-04-10 DIAGNOSIS — C9000 Multiple myeloma not having achieved remission: Secondary | ICD-10-CM

## 2014-04-10 DIAGNOSIS — D649 Anemia, unspecified: Secondary | ICD-10-CM

## 2014-04-10 DIAGNOSIS — D689 Coagulation defect, unspecified: Secondary | ICD-10-CM

## 2014-04-10 LAB — CBC WITH DIFFERENTIAL/PLATELET
BASO%: 0.7 % (ref 0.0–2.0)
BASOS ABS: 0.1 10*3/uL (ref 0.0–0.1)
EOS%: 1.6 % (ref 0.0–7.0)
Eosinophils Absolute: 0.1 10*3/uL (ref 0.0–0.5)
HEMATOCRIT: 26.6 % — AB (ref 34.8–46.6)
HEMOGLOBIN: 8.1 g/dL — AB (ref 11.6–15.9)
LYMPH%: 19.6 % (ref 14.0–49.7)
MCH: 26.1 pg (ref 25.1–34.0)
MCHC: 30.5 g/dL — AB (ref 31.5–36.0)
MCV: 85.8 fL (ref 79.5–101.0)
MONO#: 1 10*3/uL — ABNORMAL HIGH (ref 0.1–0.9)
MONO%: 11.2 % (ref 0.0–14.0)
NEUT#: 6 10*3/uL (ref 1.5–6.5)
NEUT%: 66.9 % (ref 38.4–76.8)
Platelets: 165 10*3/uL (ref 145–400)
RBC: 3.1 10*6/uL — ABNORMAL LOW (ref 3.70–5.45)
RDW: 23 % — ABNORMAL HIGH (ref 11.2–14.5)
WBC: 9 10*3/uL (ref 3.9–10.3)
lymph#: 1.8 10*3/uL (ref 0.9–3.3)
nRBC: 4 % — ABNORMAL HIGH (ref 0–0)

## 2014-04-10 MED ORDER — DEXAMETHASONE 4 MG PO TABS: 20.0000 mg | ORAL_TABLET | ORAL | Status: DC

## 2014-04-10 NOTE — Progress Notes (Signed)
[place]  [phone] Fax:([phone]) [phone]     ID: [name] OB: Jan 17, 1946  MR#: [id number]  QJF#:[id number]  PCP: Jenny Reichmann, MD GYN:   SU:  OTHER MD:  CHIEF COMPLAINT:  MYELOMA HISTORY: From the original consult note, dated 03/24/2014:  "The patient has a history of PE following arthroscopic surgery,as well as LLE DVT requiring Coumadin therapy since October of 2014. On 4/7 she presented to the ED with a 2 day history of hemoptysis and mucous material. INR was elevated at 6 requiring FFP and Vit K with improvement of coagulopathy (INR 1.4 this morning). Anticoagulation is on hold until hemoptysis resolves.CT angiogram on 4/7 was negative for pulmonary embolus. Bilateral pneumonia was suspected, requiring IV antibiotics.  Interestingly, diffusely heterogeneous appearance to the visualized bones were seen, more notable than on a prior CT in 2010. Bone scan on 4/8 showed old fractures in posterior 8-9 th ribs, and in a lumbar XR a new lumbar spinal fracture was noted, not picked up by the bone scan. Given the mottled appearance of the CT films,workup for multiple myeloma was initiated: She had anemia in the setting of chronic disease, iron deficiency and blood loss, and mild renal insufficiency was seen with a Cr 1.62. Sodium was normal. Her Calcium however was 12.3 on admission, today at greater than 15 receiving Calcitonin 100U nasal spray, Zometa 4 mg and hydration IV. SPEP / UPEP / IFE are pending. "  Her subsequent history is as detailed below.  INTERVAL HISTORY: [name] was evaluated today in the cancer clinic accompanied by her husband, son, and daughter-in-law. The research nurse was also present during the visit.  REVIEW OF SYSTEMS: [name] was released from rehabilitation 04/07/2014. Yesterday she was evaluated in the emergency room with significant epistaxis. Her nose was packed and her warfarin was definitively stopped. She was started on 81  mg a day of aspirin. [name] remains on the weak side, but tells me her pain is moderately to well controlled on Tylenol and tramadol. She does get somewhat constipated and is using MiraLAX and stool softeners to help with that. She tolerated her bone marrow biopsy without significant complications. She denies unusual headaches, visual changes, nausea, vomiting, dizziness, problems with swallowing, upset stomach, cough, phlegm production, pleurisy, rash, fever, or change in bowel or bladder habits. A detailed review of systems today was otherwise stable.  PAST MEDICAL HISTORY: Past Medical History  Diagnosis Date  . Neurofibromatosis   . Diabetes mellitus   . HTN (hypertension)   . Obesity   . Osteoarthritis, knee   . Hyperlipidemia     statin intolerant. LDL is 83  . UTI (lower urinary tract infection) 05/29/12  . 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 Pulmonary embolism [id number]    PAST SURGICAL HISTORY: Past Surgical History  Procedure Laterality Date  . Cardiac catheterization    . Tonsillectomy and adenoidectomy    . Clavicle surgery    . Abdominal hysterectomy    . Ankle fracture surgery Right   . Total knee arthroplasty Left 09/15/2013    Procedure: TOTAL KNEE ARTHROPLASTY;  Surgeon: Vickey Huger, MD;  Location: [facility];  Service: Orthopedics;  Laterality: Left;    FAMILY HISTORY Family History  Problem Relation Age of Onset  . Heart disease Mother   . Arthritis Mother   . Stroke Mother   . Heart disease Son     SOCIAL  HISTORY:  Married. Lives in [place] with husband of 41 years. 2 sons in good health.      ADVANCED DIRECTIVES:    HEALTH MAINTENANCE: History  Substance Use Topics  . Smoking status: Never Smoker   . Smokeless tobacco: Never Used  . Alcohol Use: No     Colonoscopy:  PAP:  Bone density:  Lipid panel:  Allergies  Allergen Reactions  . Codeine Nausea And Vomiting    Sees things    . Hydrocodone Nausea And Vomiting    Sees things  . Niaspan [Niacin Er] Nausea Only  . Robaxin [Methocarbamol] Other (See Comments)    Made patient feel funny  . Statins Nausea And Vomiting and Other (See Comments)    Myalgias/leg pain with atorvastatin, rosuvastatin, lovastatin, simvastatin  . Tetracycline Other (See Comments)    Pt doesn't remember reaction  . Zetia [Ezetimibe] Nausea And Vomiting  . Zithromax [Azithromycin] Nausea And Vomiting    Current Outpatient Prescriptions  Medication Sig Dispense Refill  . acetaminophen (TYLENOL) 325 MG tablet Take 1-2 tablets (325-650 mg total) by mouth every 4 (four) hours as needed for mild pain.      Marland Kitchen amoxicillin-clavulanate (AUGMENTIN) 875-125 MG per tablet Take 1 tablet by mouth 2 (two) times daily. One po bid x 7 days  14 tablet  0  . aspirin EC 81 MG tablet Take 81 mg by mouth at bedtime.      . fluconazole (DIFLUCAN) 100 MG tablet Take 100 mg by mouth daily.      Marland Kitchen glimepiride (AMARYL) 2 MG tablet Take 2 mg by mouth daily with breakfast.      . Menthol-Methyl Salicylate (MUSCLE RUB EX) Apply 1 application topically 3 (three) times daily as needed (back pain).      . methocarbamol (ROBAXIN) 500 MG tablet Take 500 mg by mouth every 6 (six) hours as needed for muscle spasms.      . metoprolol tartrate (LOPRESSOR) 25 MG tablet Take 25 mg by mouth 2 (two) times daily.      . Multiple Vitamin (MULTIVITAMIN WITH MINERALS) TABS tablet Take 1 tablet by mouth at bedtime. Centrum Silver      . Multiple Vitamins-Minerals (OCUVITE PO) Take 1 tablet by mouth daily.       . nitroGLYCERIN (NITROSTAT) 0.4 MG SL tablet Place 1 tablet (0.4 mg total) under the tongue every 5 (five) minutes as needed for chest pain (up to 3 doses).  25 tablet  3  . nystatin (MYCOSTATIN/NYSTOP) 100000 UNIT/GM POWD Apply 1 g topically 3 (three) times daily.      . polyethylene glycol (MIRALAX / GLYCOLAX) packet Take 17 g by mouth daily.      . sennosides-docusate sodium  (SENOKOT-S) 8.6-50 MG tablet Take 2 tablets by mouth at bedtime.      . traMADol (ULTRAM) 50 MG tablet Take 50 mg by mouth every 6 (six) hours as needed for moderate pain.        No current facility-administered medications for this visit.   Facility-Administered Medications Ordered in Other Visits  Medication Dose Route Frequency Provider Last Rate Last Dose  . 0.45 % sodium chloride infusion   Intravenous Continuous Virgie Dad Davidjames Blansett, MD      . sodium chloride 0.9 % injection 3 mL  3 mL Intracatheter PRN Amy Milda Smart, PA-C        OBJECTIVE: Middle-aged white woman examined in a wheelchair  Filed Vitals:   04/10/14 1449  BP: 162/82  Pulse: 96  Temp: 98 F (36.7 C)  Resp: 18     Body mass index is 0.00 kg/(m^2).    ECOG FS:1 - Symptomatic but completely ambulatory  Ocular: Sclerae unicteric, pupils equal, round and reactive to light Ear-nose-throat: Oropharynx clear and moist  Lymphatic: No cervical or supraclavicular adenopathy Lungs no rales or rhonchi,  fair excursion bilaterally Heart regular rate and rhythm, no murmur appreciated Abd soft, nontender, positive bowel sounds MSK no focal spinal tenderness to mild palpation, no joint edema Neuro: non-focal, well-oriented, appropriate affect Breasts: Deferred   LAB RESULTS:  CMP     Component Value Date/Time   NA 137 04/09/2014 1241   NA 137 04/08/2014 1136   K 4.9 04/09/2014 1241   K 5.3* 04/08/2014 1136   CL 97 04/09/2014 1241   CO2 22 04/09/2014 1241   CO2 21* 04/08/2014 1136   GLUCOSE 152* 04/09/2014 1241   GLUCOSE 166* 04/08/2014 1136   BUN 31* 04/09/2014 1241   BUN 28.4* 04/08/2014 1136   CREATININE 1.53* 04/09/2014 1241   CREATININE 1.6* 04/08/2014 1136   CREATININE 0.81 12/09/2013 1000   CALCIUM 9.6 04/09/2014 1241   CALCIUM 9.8 04/08/2014 1136   CALCIUM 11.1* 03/25/2014 0850   PROT 11.1* 04/09/2014 1241   PROT 11.7* 04/08/2014 1136   ALBUMIN 2.1* 04/09/2014 1241   ALBUMIN 2.2* 04/08/2014 1136   AST 24 04/09/2014 1241    AST 41* 04/08/2014 1136   ALT 7 04/09/2014 1241   ALT 8 04/08/2014 1136   ALKPHOS 61 04/09/2014 1241   ALKPHOS 65 04/08/2014 1136   BILITOT 0.4 04/09/2014 1241   BILITOT 0.31 04/08/2014 1136   GFRNONAA 34* 04/09/2014 1241   GFRNONAA 76 12/09/2013 1000   GFRAA 40* 04/09/2014 1241   GFRAA 88 12/09/2013 1000    I No results found for this basename: SPEP, UPEP,  kappa and lambda light chains    Lab Results  Component Value Date   WBC 9.0 04/10/2014   NEUTROABS 6.0 04/10/2014   HGB 8.1* 04/10/2014   HCT 26.6* 04/10/2014   MCV 85.8 04/10/2014   PLT 165 04/10/2014      Chemistry      Component Value Date/Time   NA 137 04/09/2014 1241   NA 137 04/08/2014 1136   K 4.9 04/09/2014 1241   K 5.3* 04/08/2014 1136   CL 97 04/09/2014 1241   CO2 22 04/09/2014 1241   CO2 21* 04/08/2014 1136   BUN 31* 04/09/2014 1241   BUN 28.4* 04/08/2014 1136   CREATININE 1.53* 04/09/2014 1241   CREATININE 1.6* 04/08/2014 1136   CREATININE 0.81 12/09/2013 1000   GLU 173 11/08/2010      Component Value Date/Time   CALCIUM 9.6 04/09/2014 1241   CALCIUM 9.8 04/08/2014 1136   CALCIUM 11.1* 03/25/2014 0850   ALKPHOS 61 04/09/2014 1241   ALKPHOS 65 04/08/2014 1136   AST 24 04/09/2014 1241   AST 41* 04/08/2014 1136   ALT 7 04/09/2014 1241   ALT 8 04/08/2014 1136   BILITOT 0.4 04/09/2014 1241   BILITOT 0.31 04/08/2014 1136       No results found for this basename: LABCA2    No components found with this basename: LABCA125     Recent Labs Lab 04/09/14 1241  INR 1.31    Urinalysis    Component Value Date/Time   COLORURINE YELLOW 03/24/2014 1827   APPEARANCEUR CLEAR 03/24/2014 1827   LABSPEC 1.028 03/24/2014 1827   PHURINE 5.0 03/24/2014 1827   GLUCOSEU NEGATIVE 03/24/2014  [place] 06/07/2012 [place] 03/24/2014 [place] 03/24/2014 1827   BILIRUBINUR neg 02/11/2014 [place] 03/24/2014 1827   PROTEINUR 30* 03/24/2014 1827   PROTEINUR neg 02/11/2014 0845   UROBILINOGEN  0.2 03/24/2014 1827   UROBILINOGEN 0.2 02/11/2014 0845   NITRITE NEGATIVE 03/24/2014 1827   NITRITE positive 02/11/2014 0845   LEUKOCYTESUR NEGATIVE 03/24/2014 1827    STUDIES: Dg Chest 2 View  03/24/2014   CLINICAL DATA:  Back pain  EXAM: CHEST  2 VIEW  COMPARISON:  09/19/2013  FINDINGS: Cardiomediastinal silhouette is stable. There is streaky bilateral basilar airspace disease highly suspicious for infiltrates. Best seen on lateral view. No pulmonary edema. Osteopenia and degenerative changes thoracic spine. There is moderate compression deformity upper lumbar spine of indeterminate age. Clinical correlation is necessary  IMPRESSION: Streaky bilateral basilar airspace disease suspicious for infiltrates. Follow-up to resolution recommended. No pulmonary edema. Moderate compression fracture upper lumbar spine of indeterminate age. Clinical correlation is necessary.   Electronically Signed   By: Lahoma Crocker M.D.   On: 03/24/2014 18:52   Dg Lumbar Spine 2-3 Views  03/24/2014   CLINICAL DATA:  Back pain.  EXAM: LUMBAR SPINE - 2-3 VIEW  COMPARISON:  DG LUMBAR SPINE 2-3V dated 02/11/2014; DG CHEST 2 VIEW dated 03/24/2014; CT ANGIO CHEST W/CM &/OR WO/CM dated 09/17/2013; DG CHEST 2 VIEW dated 01/21/2013  FINDINGS: There are 5 lumbar type vertebral bodies. There is a degenerative grade 1 anterolisthesis at L4-5 which appears stable. As demonstrated on today's chest radiographs, there is a superior endplate compression deformity at L1 which is new compared with the lumbar spine radiographs obtained 6 weeks ago. This results in approximately 15% loss of vertebral body height. The posterior vertebral body cortex appears grossly intact. There is stable mild disc space loss at L2-3. There is facet disease inferiorly.  IMPRESSION: Superior endplate compression deformity at L1 is new compared with radiographs obtained 6 weeks ago consistent with an acute/subacute injury. Stable anterolisthesis at L4-5.   Electronically Signed   By:  Camie Patience M.D.   On: 03/24/2014 20:01   Ct Angio Chest W/cm &/or Wo Cm  03/24/2014   CLINICAL DATA:  Hemoptysis.  Shortness of breath and tachycardia.  EXAM: CT ANGIOGRAPHY CHEST WITH CONTRAST  TECHNIQUE: Multidetector CT imaging of the chest was performed using the standard protocol during bolus administration of intravenous contrast. Multiplanar CT image reconstructions and MIPs were obtained to evaluate the vascular anatomy.  CONTRAST:  129m OMNIPAQUE IOHEXOL 350 MG/ML SOLN  COMPARISON:  Chest radiograph performed earlier today at 6:46 p.m., and CTA of the chest performed 09/17/2013  FINDINGS: There is no evidence of pulmonary embolus. Evaluation for pulmonary embolus is mildly suboptimal due to limitations in the timing of the contrast bolus.  Patchy bilateral airspace consolidation is noted within all lobes of both lungs, though most prominent at the lower lung lobes bilaterally. Findings are compatible with diffuse multifocal pneumonia. There is no evidence of pleural effusion or pneumothorax. No masses are identified, though evaluation is suboptimal due to airspace consolidation; no abnormal focal contrast enhancement is seen.  Diffuse coronary artery calcifications are seen. Scattered mediastinal nodes remain normal in size, though visualized retroesophageal nodes are slightly unusual. No pericardial effusion is identified. The great vessels are grossly unremarkable in appearance. No axillary lymphadenopathy is seen. The visualized portions of the thyroid gland are unremarkable in appearance.  The visualized portions of the liver and  spleen are unremarkable. The visualized portions of the pancreas, gallbladder, stomach and adrenal glands are within normal limits.  There is a diffusely heterogeneous appearance to the attenuation of visualized osseous structures, somewhat more prominent than on the prior study and new from 2010; would correlate for evidence of underlying systemic condition or possibly  metastatic disease. There is a partially healed chronic right fifth posterior rib fracture.  Review of the MIP images confirms the above findings.  IMPRESSION: 1. No evidence of pulmonary embolus. 2. Relatively diffuse multifocal pneumonia noted bilaterally. 3. Diffuse coronary artery calcifications seen. 4. Diffusely heterogeneous appearance to the attenuation of visualized osseous structures, somewhat more prominent than on the prior study and new from 2010. Would correlate for evidence of underlying systemic condition, or possibly metastatic disease. Bone scan or correlation with lab findings could be considered for further evaluation, as deemed clinically appropriate.   Electronically Signed   By: Garald Balding M.D.   On: 03/24/2014 21:35   Nm Bone Scan Whole Body  03/25/2014   CLINICAL DATA:  Back pain.  EXAM: NUCLEAR MEDICINE WHOLE BODY BONE SCAN  TECHNIQUE: Whole body anterior and posterior images were obtained approximately 3 hours after intravenous injection of radiopharmaceutical.  RADIOPHARMACEUTICALS:  25 mCi Technetium-99 MDP  COMPARISON:  Chest CT 03/24/2014.  FINDINGS: Punctate area of increased activity noted over approximate the right posterior fifth and a left posterior ninth ribs. These correspond to old rib fractures. Given the mottled appearance of the bony structures of the chest on recent chest CT, myeloma should be considered and serum protein electrophoresis should be considered.  IMPRESSION: 1. Findings consistent with old right posterior fifth and left posterior ninth rib fractures. No evidence of metastatic disease. 2. Given the mottled appearance of the bony structures on recent chest CT, to evaluate for myeloma serum protein electrophoresis should be considered.   Electronically Signed   By: Marcello Moores  Register   On: 03/25/2014 16:11   Ct Biopsy  03/30/2014   CLINICAL DATA:  68 year old female with numerous lytic bony lesions concerning for multiple myeloma. CT-guided bone marrow  biopsy is requested to confirm the diagnosis.  EXAM: CT BIOPSY  Date: 03/30/2014  PROCEDURE: 1. CT guided bone marrow aspiration and core biopsy Interventional Radiologist:  Criselda Peaches, MD  ANESTHESIA/SEDATION: Moderate (conscious) sedation was used. 0.5 mg Versed, 50 mcg Fentanyl were administered intravenously. The patient's vital signs were monitored continuously by radiology nursing throughout the procedure.  Sedation Time: 10 minutes  TECHNIQUE: Informed consent was obtained from the patient following explanation of the procedure, risks, benefits and alternatives. The patient understands, agrees and consents for the procedure. All questions were addressed. A time out was performed.  The patient was positioned prone and noncontrast localization CT was performed of the pelvis to demonstrate the iliac marrow spaces.  Maximal barrier sterile technique utilized including caps, mask, sterile gowns, sterile gloves, large sterile drape, hand hygiene, and betadine prep.  Under sterile conditions and local anesthesia, an 11 gauge coaxial bone biopsy needle was advanced into the left iliac marrow space. Needle position was confirmed with CT imaging. Initially, bone marrow aspiration was performed. Next, the 11 gauge outer cannula was utilized to obtain a left iliac bone marrow core biopsy. Needle was removed. Hemostasis was obtained with compression. The patient tolerated the procedure well. Samples were prepared with the cytotechnologist. No immediate complications.  IMPRESSION: CT guided left iliac bone marrow aspiration and core biopsy.  Signed,  Criselda Peaches, MD  [facility]  [facility]   Electronically Signed   By: Jacqulynn Cadet M.D.   On: 03/30/2014 13:45    ASSESSMENT: 68 y.o. [place] woman presenting with hypercalcemia, anemia and mottled bone lesions April 2015, M-spike 3.82 g, IFE showing IgA/kappa myeloma with bone marrow biopsy 03/30/2014  with 88% plasma cells, FISH  t(4,14), deletion 13 and deletion 11 (intermediate risk); initial beta-2 microglobulin 16.  (1) consented to E1A11 study, randomization pending  (2) associated issues:  (a) coagulopathy: history of post-op PE; history of bleeding with warfarin; starting ASA 325 ms/ day 04/10/2014  (b) anemia, requiring transfusion: start erythropoietin  (c) hypercalcemia: start zolendronate  (d) pain: compression fracture L1, rib fractures: on tylenol and tramadol  (e) immunocompromise: to start acyclovir and Septra prophylaxiss  PLAN: We spent the better part of today's hour-long appointment discussing the biology of myeloma in general, and the specifics of the patient's cancer in particular. The patient understands that IgA myeloma is "harder to treat" then IgG, and that her FISH results show an intermediate risk tumor. She will receive lenalidomide and dexamethasone and then either carfilzomib or bortezomib depending on randomization. She has a good understanding of the possible toxicities, side effects and complications of these agents, which were extensively discussed by myself, the research nurse, and our regular nurse. The patient has signed consent and we are awaiting on the final studies (24-hour urine and bone survey).  In the meantime we are going to start dexamethasone 40 mg weekly (she will take 20 mg on Tuesdays and Thursdays beginning April 28) to get a bit of a head start on her tumor. She will check her blood sugar before meals and at bedtime those 2 days, right down the results and call us if the blood sugar rises above 200. She will receive 2 units of packed red cells next week. Once she is randomized we  will start her on zolendronate and aranesp and consider kyphoplasty if her pain is not well-controlled on her current medications.   Today we also increased her aspirin, since she will be going off Coumadin (she has been anticoagulated 6 months for a provoked pulmonary  embolus) given her recent epistaxis. I am also going to obtain an echocardiogram given her history of coronary artery disease, though her most recent one, 6 months ago, showed an excellent ejection fraction.  Maddi has a good understanding of the overall plan. She agrees with it. She knows the goal of treatment in her case is control. She will call with any problems that may develop before her next visit here.   Chauncey Cruel, MD   04/10/2014 4:02 PM

## 2014-04-10 NOTE — Telephone Encounter (Signed)
Opened in error

## 2014-04-11 ENCOUNTER — Telehealth: Payer: Self-pay

## 2014-04-11 NOTE — Telephone Encounter (Signed)
Spoke with pt. She has an appt with Jodi Marble, MD On 01/01/7261. (at 1:30 PM. )  She will call Dr. Virgie Dad office on Monday

## 2014-04-12 ENCOUNTER — Emergency Department (HOSPITAL_COMMUNITY)
Admission: EM | Admit: 2014-04-12 | Discharge: 2014-04-12 | Disposition: A | Discharge status: Home / Self Care | Payer: Medicare Other | Attending: Emergency Medicine | Admitting: Emergency Medicine

## 2014-04-12 ENCOUNTER — Other Ambulatory Visit: Payer: Self-pay | Admitting: *Deleted

## 2014-04-12 ENCOUNTER — Encounter (HOSPITAL_COMMUNITY): Payer: Self-pay | Admitting: Emergency Medicine

## 2014-04-12 DIAGNOSIS — Z87440 Personal history of urinary (tract) infections: Secondary | ICD-10-CM | POA: Insufficient documentation

## 2014-04-12 DIAGNOSIS — Z9889 Other specified postprocedural states: Secondary | ICD-10-CM | POA: Insufficient documentation

## 2014-04-12 DIAGNOSIS — IMO0002 Reserved for concepts with insufficient information to code with codable children: Secondary | ICD-10-CM | POA: Insufficient documentation

## 2014-04-12 DIAGNOSIS — C9000 Multiple myeloma not having achieved remission: Secondary | ICD-10-CM

## 2014-04-12 DIAGNOSIS — Z79899 Other long term (current) drug therapy: Secondary | ICD-10-CM | POA: Insufficient documentation

## 2014-04-12 DIAGNOSIS — Z86711 Personal history of pulmonary embolism: Secondary | ICD-10-CM | POA: Insufficient documentation

## 2014-04-12 DIAGNOSIS — E119 Type 2 diabetes mellitus without complications: Secondary | ICD-10-CM | POA: Insufficient documentation

## 2014-04-12 DIAGNOSIS — I10 Essential (primary) hypertension: Secondary | ICD-10-CM | POA: Insufficient documentation

## 2014-04-12 DIAGNOSIS — D649 Anemia, unspecified: Secondary | ICD-10-CM

## 2014-04-12 DIAGNOSIS — M1710 Unilateral primary osteoarthritis, unspecified knee: Secondary | ICD-10-CM | POA: Insufficient documentation

## 2014-04-12 DIAGNOSIS — Z7982 Long term (current) use of aspirin: Secondary | ICD-10-CM | POA: Insufficient documentation

## 2014-04-12 DIAGNOSIS — Z792 Long term (current) use of antibiotics: Secondary | ICD-10-CM | POA: Insufficient documentation

## 2014-04-12 DIAGNOSIS — R42 Dizziness and giddiness: Secondary | ICD-10-CM | POA: Insufficient documentation

## 2014-04-12 DIAGNOSIS — E669 Obesity, unspecified: Secondary | ICD-10-CM | POA: Insufficient documentation

## 2014-04-12 DIAGNOSIS — R040 Epistaxis: Secondary | ICD-10-CM | POA: Insufficient documentation

## 2014-04-12 DIAGNOSIS — I252 Old myocardial infarction: Secondary | ICD-10-CM | POA: Insufficient documentation

## 2014-04-12 LAB — TYPE AND SCREEN
ABO/RH(D): B POS
Antibody Screen: POSITIVE
DAT, IGG: NEGATIVE
Unit division: 0
Unit division: 0

## 2014-04-12 LAB — CBC
HCT: 26.2 % — ABNORMAL LOW (ref 36.0–46.0)
Hemoglobin: 8.3 g/dL — ABNORMAL LOW (ref 12.0–15.0)
MCH: 27.1 pg (ref 26.0–34.0)
MCHC: 31.7 g/dL (ref 30.0–36.0)
MCV: 85.6 fL (ref 78.0–100.0)
Platelets: 162 10*3/uL (ref 150–400)
RBC: 3.06 MIL/uL — ABNORMAL LOW (ref 3.87–5.11)
RDW: 23 % — ABNORMAL HIGH (ref 11.5–15.5)
WBC: 9 10*3/uL (ref 4.0–10.5)

## 2014-04-12 MED ORDER — SILVER NITRATE-POT NITRATE 75-25 % EX MISC
2.0000 | Freq: Once | CUTANEOUS | Status: AC
  Administered 2014-04-12: 2 via TOPICAL
  Filled 2014-04-12: qty 2

## 2014-04-12 MED ORDER — OXYMETAZOLINE HCL 0.05 % NA SOLN
1.0000 | Freq: Once | NASAL | Status: AC
Start: 2014-04-12 — End: 2014-04-12
  Administered 2014-04-12: 1 via NASAL
  Filled 2014-04-12: qty 15

## 2014-04-12 NOTE — Discharge Instructions (Signed)
If nose bleed starts again, apply direct pressure to bridge of your nose and hold without checking for 20 minutes. If bleeding continues, return to ER for further evaluation and treatment.  Follow up with Dr. Erik Obey, ENT, as scheduled for tomorrow.   Nosebleed A nosebleed can be caused by many things, including:  Getting hit hard in the nose.  Infections.  Dry nose.  Colds.  Medicines. Your doctor may do lab testing if you get nosebleeds a lot and the cause is not known. HOME CARE   If your nose was packed with material, keep it there until your doctor takes it out. Put the pack back in your nose if the pack falls out.  Do not blow your nose for 12 hours after the nosebleed.  Sit up and bend forward if your nose starts bleeding again. Pinch the front half of your nose nonstop for 20 minutes.  Put petroleum jelly inside your nose every morning if you have a dry nose.  Use a humidifier to make the air less dry.  Do not take aspirin.  Try not to strain, lift, or bend at the waist for many days after the nosebleed. GET HELP RIGHT AWAY IF:   Nosebleeds keep happening and are hard to stop or control.  You have bleeding or bruises that are not normal on other parts of the body.  You have a fever.  The nosebleeds get worse.  You get lightheaded, feel faint, sweaty, or throw up (vomit) blood. MAKE SURE YOU:   Understand these instructions.  Will watch your condition.  Will get help right away if you are not doing well or get worse. Document Released: 09/12/2008 Document Revised: 02/26/2012 Document Reviewed: 09/12/2008 [facility] ([facility]) Patient Information 2014 [place].

## 2014-04-12 NOTE — ED Provider Notes (Signed)
Medical screening examination/treatment/procedure(s) were conducted as a shared visit with non-physician practitioner(s) and myself.  I personally evaluated the patient during the encounter.   EKG Interpretation None       Orlie Dakin, MD 04/12/14 2255

## 2014-04-12 NOTE — ED Provider Notes (Signed)
Patient c/o epistaxis around rapid rhino from right nares placed here 2 days ago. She denies lightheadedness denies pain anywhere. On exam patient is alert Glasgow Coma Score 15. HEENT exam rapid Rhino in place rapid Rhino is blood soaked. No blood in oropharynx. Procedure rapid Rhino was removed by me. Nose was inspected with headlamp with nasal speculum. Bleeding site was noted distally after nasal septum. Cauterized with silver nitrate stick by me  Orlie Dakin, MD 04/12/14 2255

## 2014-04-12 NOTE — ED Provider Notes (Signed)
CSN: [id number]     Arrival date & time 04/12/14  1553 History   First MD Initiated Contact with Patient 04/12/14 1627     Chief Complaint  Patient presents with  . Epistaxis     (Consider location/radiation/quality/duration/timing/severity/associated sxs/prior Treatment) HPI Pt is a 68yo female with hx of HTN presenting to ED with nose bleed. Pt was evaluated on 4/23 for first nose bleed out of her right nostril, discharged home with Rhino Rocket in place, plan was to f/u with Dr. Erik Obey, ENT, tomorrow for packing removal.  Pt states she sat down to eat lunch this afternoon around 15:30 today when bleeding started out of her right nostril around the packing.  Denies sneezing or blowing her nose. States she used Afrin in left nostril which did help some but states right nostril continued to bleed even after applying pressure.  Pt does take aspirin, no other blood thinners. Denies trauma to nose. Denies fever. Denies weakness. Reports mild lightheadedness after onset of nose bleed.    Past Medical History  Diagnosis Date  . Neurofibromatosis   . Diabetes mellitus   . HTN (hypertension)   . Obesity   . Osteoarthritis, knee   . Hyperlipidemia     statin intolerant. LDL is 83  . UTI (lower urinary tract infection) 05/29/12  . 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 Pulmonary embolism [id number]   Past Surgical History  Procedure Laterality Date  . Cardiac catheterization    . Tonsillectomy and adenoidectomy    . Clavicle surgery    . Abdominal hysterectomy    . Ankle fracture surgery Right   . Total knee arthroplasty Left 09/15/2013    Procedure: TOTAL KNEE ARTHROPLASTY;  Surgeon: Vickey Huger, MD;  Location: [place];  Service: Orthopedics;  Laterality: Left;   Family History  Problem Relation Age of Onset  . Heart disease Mother   . Arthritis Mother   . Stroke Mother   . Heart disease Son    History  Substance Use Topics   . Smoking status: Never Smoker   . Smokeless tobacco: Never Used  . Alcohol Use: No   OB History   Grav Para Term Preterm Abortions TAB SAB Ect Mult Living                 Review of Systems  Constitutional: Negative for fever and chills.  HENT: Positive for nosebleeds.   Respiratory: Negative for shortness of breath.   Cardiovascular: Negative for chest pain.  Gastrointestinal: Negative for nausea, vomiting and abdominal pain.  Neurological: Positive for light-headedness. Negative for headaches.  All other systems reviewed and are negative.     Allergies  Codeine; Hydrocodone; Niaspan; Robaxin; Statins; Tetracycline; Zetia; and Zithromax  Home Medications   Prior to Admission medications   Medication Sig Start Date End Date Taking? Authorizing Provider  acetaminophen (TYLENOL) 325 MG tablet Take 1-2 tablets (325-650 mg total) by mouth every 4 (four) hours as needed for mild pain. 04/07/14  Yes Ivan Anchors Love, PA-C  amoxicillin-clavulanate (AUGMENTIN) 875-125 MG per tablet Take 1 tablet by mouth 2 (two) times daily. 7 day therapy course patient began on 04/09/14   Yes Historical Provider, MD  aspirin EC 81 MG tablet Take 81 mg by mouth at bedtime.   Yes Historical Provider, MD  dexamethasone (DECADRON) 4 MG tablet Take 5 tablets (20 mg total) by mouth as directed. 04/10/14  Yes Virgie Dad  Magrinat, MD  fluconazole (DIFLUCAN) 100 MG tablet Take 100 mg by mouth daily.   Yes Historical Provider, MD  glimepiride (AMARYL) 2 MG tablet Take 2 mg by mouth daily with breakfast.   Yes Historical Provider, MD  Menthol-Methyl Salicylate (MUSCLE RUB EX) Apply 1 application topically 3 (three) times daily as needed (back pain).   Yes Historical Provider, MD  methocarbamol (ROBAXIN) 500 MG tablet Take 500 mg by mouth every 6 (six) hours as needed for muscle spasms.   Yes Historical Provider, MD  metoprolol tartrate (LOPRESSOR) 25 MG tablet Take 25 mg by mouth 2 (two) times daily.   Yes Historical  Provider, MD  Multiple Vitamin (MULTIVITAMIN WITH MINERALS) TABS tablet Take 1 tablet by mouth at bedtime. Centrum Silver   Yes Historical Provider, MD  Multiple Vitamins-Minerals (OCUVITE PO) Take 1 tablet by mouth daily.    Yes Historical Provider, MD  nitroGLYCERIN (NITROSTAT) 0.4 MG SL tablet Place 1 tablet (0.4 mg total) under the tongue every 5 (five) minutes as needed for chest pain (up to 3 doses). 07/01/13  Yes Darlin Coco, MD  nystatin (MYCOSTATIN/NYSTOP) 100000 UNIT/GM POWD Apply 1 g topically 3 (three) times daily.   Yes Historical Provider, MD  polyethylene glycol (MIRALAX / GLYCOLAX) packet Take 17 g by mouth daily.   Yes Historical Provider, MD  sennosides-docusate sodium (SENOKOT-S) 8.6-50 MG tablet Take 2 tablets by mouth at bedtime.   Yes Historical Provider, MD  traMADol (ULTRAM) 50 MG tablet Take 50 mg by mouth every 6 (six) hours as needed for moderate pain.  03/18/14  Yes Historical Provider, MD   BP 146/81  Pulse 105  Temp(Src) 97.6 F (36.4 C) (Axillary)  Resp 16  SpO2 94% Physical Exam  Nursing note and vitals reviewed. Constitutional: She appears well-developed and well-nourished. No distress.  HENT:  Head: Normocephalic and atraumatic.  Nose: Epistaxis ( right nare, Rhino Rocket in place, drenched in bright red blood) is observed.  Mouth/Throat: Uvula is midline and mucous membranes are normal.  Rhino Rocket-soaked in bright red blood with steady bleeding from right nostril.  Large blood clot in posterior pharynx    Eyes: Conjunctivae are normal. No scleral icterus.  Neck: Normal range of motion.  Cardiovascular: Normal rate, regular rhythm and normal heart sounds.   Pulmonary/Chest: Effort normal and breath sounds normal. No respiratory distress. She has no wheezes. She has no rales. She exhibits no tenderness.  Abdominal: Soft. Bowel sounds are normal. She exhibits no distension and no mass. There is no tenderness. There is no rebound and no guarding.   Musculoskeletal: Normal range of motion.  Neurological: She is alert.  Skin: Skin is warm and dry. She is not diaphoretic.    ED Course  Procedures (including critical care time) Labs Review Labs Reviewed  CBC - Abnormal; Notable for the following:    RBC 3.06 (*)    Hemoglobin 8.3 (*)    HCT 26.2 (*)    RDW 23.0 (*)    All other components within normal limits    Imaging Review No results found.   EKG Interpretation None      MDM   Final diagnoses:  Epistaxis, recurrent    Pt presenting to ED with epistaxis, Rhino Rocket in place in right nare from same, placed on 4/23.  Plan was to have packet removed tomorrow, however nose started to bleed again.  Discussed pt with Dr. Winfred Leeds who also examined pt, was able to remove Rhino Rocket, identified source of bleed  and able to obtain hemostasis with silver nitrate.  Pt observed in ED for 2 hours.   6:39 PM  Pt ambulated around ED, no return of epistaxis.  Discussed pt with Dr. Winfred Leeds who agreed pt may be discharged home. No indiciation for repacking nose.  Will have pt f/u as scheduled with Dr. Erik Obey, ENT, tomorrow. Return precautions provided. Pt verbalized understanding and agreement with tx plan.      Noland Fordyce, PA-C 04/12/14 2049

## 2014-04-12 NOTE — ED Notes (Signed)
Pt from home c/o of a nosebleed in the right nostril that started about 1530 today. She was seen here on Thursday and a Rhino Rocket placed in Right nostril. Blood is  Steadily dripping around rhino rocket.

## 2014-04-13 ENCOUNTER — Observation Stay (HOSPITAL_COMMUNITY)
Admission: EM | Admit: 2014-04-13 | Discharge: 2014-04-15 | Disposition: A | Discharge status: Home / Self Care | Payer: Medicare Other | Attending: Family Medicine | Admitting: Family Medicine

## 2014-04-13 ENCOUNTER — Other Ambulatory Visit: Payer: Self-pay | Admitting: Physician Assistant

## 2014-04-13 ENCOUNTER — Ambulatory Visit (HOSPITAL_COMMUNITY)
Admission: RE | Admit: 2014-04-13 | Discharge: 2014-04-13 | Disposition: A | Discharge status: Home / Self Care | Payer: Medicare Other | Source: Ambulatory Visit | Attending: Oncology | Admitting: Oncology

## 2014-04-13 ENCOUNTER — Encounter: Payer: Self-pay | Admitting: Oncology

## 2014-04-13 ENCOUNTER — Encounter: Payer: Self-pay | Admitting: *Deleted

## 2014-04-13 ENCOUNTER — Other Ambulatory Visit: Payer: Medicare Other

## 2014-04-13 ENCOUNTER — Telehealth: Payer: Self-pay | Admitting: Oncology

## 2014-04-13 ENCOUNTER — Encounter (HOSPITAL_COMMUNITY): Payer: Self-pay | Admitting: Emergency Medicine

## 2014-04-13 DIAGNOSIS — C9000 Multiple myeloma not having achieved remission: Secondary | ICD-10-CM

## 2014-04-13 DIAGNOSIS — R040 Epistaxis: Principal | ICD-10-CM | POA: Insufficient documentation

## 2014-04-13 DIAGNOSIS — M549 Dorsalgia, unspecified: Secondary | ICD-10-CM | POA: Insufficient documentation

## 2014-04-13 DIAGNOSIS — Z96659 Presence of unspecified artificial knee joint: Secondary | ICD-10-CM | POA: Insufficient documentation

## 2014-04-13 DIAGNOSIS — N183 Chronic kidney disease, stage 3 unspecified: Secondary | ICD-10-CM | POA: Diagnosis present

## 2014-04-13 DIAGNOSIS — E669 Obesity, unspecified: Secondary | ICD-10-CM | POA: Insufficient documentation

## 2014-04-13 DIAGNOSIS — M810 Age-related osteoporosis without current pathological fracture: Secondary | ICD-10-CM | POA: Insufficient documentation

## 2014-04-13 DIAGNOSIS — Z86711 Personal history of pulmonary embolism: Secondary | ICD-10-CM | POA: Insufficient documentation

## 2014-04-13 DIAGNOSIS — E119 Type 2 diabetes mellitus without complications: Secondary | ICD-10-CM | POA: Insufficient documentation

## 2014-04-13 DIAGNOSIS — C9002 Multiple myeloma in relapse: Secondary | ICD-10-CM | POA: Diagnosis present

## 2014-04-13 DIAGNOSIS — Z9861 Coronary angioplasty status: Secondary | ICD-10-CM | POA: Insufficient documentation

## 2014-04-13 DIAGNOSIS — E1122 Type 2 diabetes mellitus with diabetic chronic kidney disease: Secondary | ICD-10-CM | POA: Diagnosis present

## 2014-04-13 DIAGNOSIS — D649 Anemia, unspecified: Secondary | ICD-10-CM

## 2014-04-13 DIAGNOSIS — Z79899 Other long term (current) drug therapy: Secondary | ICD-10-CM | POA: Insufficient documentation

## 2014-04-13 DIAGNOSIS — I252 Old myocardial infarction: Secondary | ICD-10-CM | POA: Insufficient documentation

## 2014-04-13 DIAGNOSIS — Z7901 Long term (current) use of anticoagulants: Secondary | ICD-10-CM | POA: Insufficient documentation

## 2014-04-13 DIAGNOSIS — I2510 Atherosclerotic heart disease of native coronary artery without angina pectoris: Secondary | ICD-10-CM | POA: Insufficient documentation

## 2014-04-13 DIAGNOSIS — K5900 Constipation, unspecified: Secondary | ICD-10-CM | POA: Insufficient documentation

## 2014-04-13 DIAGNOSIS — R000 Tachycardia, unspecified: Secondary | ICD-10-CM | POA: Insufficient documentation

## 2014-04-13 DIAGNOSIS — I10 Essential (primary) hypertension: Secondary | ICD-10-CM | POA: Insufficient documentation

## 2014-04-13 LAB — CBC
HCT: 25.2 % — ABNORMAL LOW (ref 36.0–46.0)
Hemoglobin: 8 g/dL — ABNORMAL LOW (ref 12.0–15.0)
MCH: 27.6 pg (ref 26.0–34.0)
MCHC: 31.7 g/dL (ref 30.0–36.0)
MCV: 86.9 fL (ref 78.0–100.0)
Platelets: 154 10*3/uL (ref 150–400)
RBC: 2.9 MIL/uL — ABNORMAL LOW (ref 3.87–5.11)
RDW: 23.2 % — AB (ref 11.5–15.5)
WBC: 11.3 10*3/uL — ABNORMAL HIGH (ref 4.0–10.5)

## 2014-04-13 LAB — BASIC METABOLIC PANEL
BUN: 32 mg/dL — AB (ref 6–23)
CO2: 21 mEq/L (ref 19–32)
Calcium: 9.7 mg/dL (ref 8.4–10.5)
Chloride: 93 mEq/L — ABNORMAL LOW (ref 96–112)
Creatinine, Ser: 1.45 mg/dL — ABNORMAL HIGH (ref 0.50–1.10)
GFR calc non Af Amer: 37 mL/min — ABNORMAL LOW (ref >90)
GFR, EST AFRICAN AMERICAN: 42 mL/min — AB (ref >90)
Glucose, Bld: 123 mg/dL — ABNORMAL HIGH (ref 70–99)
POTASSIUM: 4.4 meq/L (ref 3.7–5.3)
Sodium: 136 mEq/L — ABNORMAL LOW (ref 137–147)

## 2014-04-13 LAB — CBG MONITORING, ED: Glucose-Capillary: 120 mg/dL — ABNORMAL HIGH (ref 70–99)

## 2014-04-13 LAB — HOLD TUBE, BLOOD BANK

## 2014-04-13 NOTE — ED Notes (Signed)
Pt reports that she was at the ENT today and had her nose cauterized to stop the bleeding. States that after she left she started bleeding again and was told to come here if it did. Pt with bleeding noted at triage.

## 2014-04-13 NOTE — ED Notes (Signed)
Dr Rosen at bedside 

## 2014-04-13 NOTE — Telephone Encounter (Signed)
, °

## 2014-04-13 NOTE — ED Provider Notes (Signed)
CSN: [id number]     Arrival date & time 04/13/14  1717 History   First MD Initiated Contact with Patient 04/13/14 2052     Chief Complaint  Patient presents with  . Epistaxis     (Consider location/radiation/quality/duration/timing/severity/associated sxs/prior Treatment) HPI Pt presenting with epistaxis.  She was seen yesterday in the ED, nose was cauterized, she then was seen at Dr. Janeice Robinson office where she had cautery in the office, she bled afterwards and then was packed.  Upon arrival home patient had recurrence of bleeding.  She called Dr. Janeice Robinson office and was advised to come to the ED.  Pt has chronic anemia and has recently been diagnosed with multiple myeloma, she is scheduled for blood transfusion tomorrow morning at [facility].  She was taken off blood thinners several weeks ago.  Now takes daily aspirin.  No other hx of easy bruising or bleeding.  There are no other associated systemic symptoms, there are no other alleviating or modifying factors.   Past Medical History  Diagnosis Date  . Neurofibromatosis   . Diabetes mellitus   . HTN (hypertension)   . Obesity   . Osteoarthritis, knee   . Hyperlipidemia     statin intolerant. LDL is 83  . UTI (lower urinary tract infection) 05/29/12  . 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 Pulmonary embolism [id number]   Past Surgical History  Procedure Laterality Date  . Cardiac catheterization    . Tonsillectomy and adenoidectomy    . Clavicle surgery    . Abdominal hysterectomy    . Ankle fracture surgery Right   . Total knee arthroplasty Left 09/15/2013    Procedure: TOTAL KNEE ARTHROPLASTY;  Surgeon: Vickey Huger, MD;  Location: [place];  Service: Orthopedics;  Laterality: Left;   Family History  Problem Relation Age of Onset  . Heart disease Mother   . Arthritis Mother   . Stroke Mother   . Heart disease Son    History  Substance Use Topics  . Smoking status:  Never Smoker   . Smokeless tobacco: Never Used  . Alcohol Use: No   OB History   Grav Para Term Preterm Abortions TAB SAB Ect Mult Living                 Review of Systems ROS reviewed and all otherwise negative except for mentioned in HPI    Allergies  Codeine; Hydrocodone; Niaspan; Robaxin; Statins; Tetracycline; Zetia; and Zithromax  Home Medications   Prior to Admission medications   Medication Sig Start Date End Date Taking? Authorizing Provider  acetaminophen (TYLENOL) 325 MG tablet Take 1-2 tablets (325-650 mg total) by mouth every 4 (four) hours as needed for mild pain. 04/07/14  Yes Ivan Anchors Love, PA-C  amoxicillin-clavulanate (AUGMENTIN) 875-125 MG per tablet Take 1 tablet by mouth 2 (two) times daily. 7 day therapy course patient began on 04/09/14   Yes Historical Provider, MD  aspirin 325 MG tablet Take 325 mg by mouth daily. 04/11/14  Yes Historical Provider, MD  dexamethasone (DECADRON) 4 MG tablet Take 5 tablets (20 mg total) by mouth as directed. 04/10/14  Yes Chauncey Cruel, MD  glimepiride (AMARYL) 2 MG tablet Take 2 mg by mouth daily with breakfast.   Yes Historical Provider, MD  methocarbamol (ROBAXIN) 500 MG tablet Take 500 mg by mouth every 6 (six) hours as needed for muscle spasms.   Yes Historical Provider, MD  metoprolol tartrate (LOPRESSOR) 25 MG tablet Take 25 mg by mouth 2 (two) times daily.   Yes Historical Provider, MD  Multiple Vitamin (MULTIVITAMIN WITH MINERALS) TABS tablet Take 1 tablet by mouth at bedtime. Centrum Silver   Yes Historical Provider, MD  Multiple Vitamins-Minerals (OCUVITE PO) Take 1 tablet by mouth daily.    Yes Historical Provider, MD  nitroGLYCERIN (NITROSTAT) 0.4 MG SL tablet Place 1 tablet (0.4 mg total) under the tongue every 5 (five) minutes as needed for chest pain (up to 3 doses). 07/01/13  Yes Darlin Coco, MD  nystatin (MYCOSTATIN/NYSTOP) 100000 UNIT/GM POWD Apply 1 g topically 3 (three) times daily.   Yes Historical  Provider, MD  polyethylene glycol (MIRALAX / GLYCOLAX) packet Take 17 g by mouth daily.   Yes Historical Provider, MD  sennosides-docusate sodium (SENOKOT-S) 8.6-50 MG tablet Take 2 tablets by mouth at bedtime.   Yes Historical Provider, MD  traMADol (ULTRAM) 50 MG tablet Take 50 mg by mouth every 6 (six) hours as needed for moderate pain.  03/18/14  Yes Historical Provider, MD  fluconazole (DIFLUCAN) 100 MG tablet Take 100 mg by mouth daily.    Historical Provider, MD   BP 132/57  Pulse 116  Temp(Src) 98 F (36.7 C) (Oral)  Resp 20  Ht $R'5\' 2"'wU$  (1.575 m)  Wt 182 lb (82.555 kg)  BMI 33.28 kg/m2  SpO2 90% Vitals reviewed Physical Exam Physical Examination: General appearance - alert, well appearing, and in no distress Mental status - alert, oriented to person, place, and time Eyes - no scleral icterus, no conjunctival injection Nose - right nare packed with oozing of blood actively around packing Mouth - mucous membranes moist, pharynx normal without lesions Chest - clear to auscultation, no wheezes, rales or rhonchi, symmetric air entry Heart - normal rate, regular rhythm, normal S1, S2, no murmurs, rubs, clicks or gallops Abdomen - soft, nontender, nondistended, no masses or organomegaly Extremities - peripheral pulses normal, no pedal edema, no clubbing or cyanosis Skin - normal coloration and turgor, no rashes  ED Course  Procedures (including critical care time)  9:37 PM d/w Dr. Constance Holster, he requests combined ENT head and neck tray to beside and he will see patient in the ED.   12:22 AM Dr. Constance Holster has seen patient, her nose is cauterized and is no longer bleeding.  Dr. Constance Holster requests that patient be admitted to medical service overnight, in addition she has blood transfusion scheduled at [facility] tomorrow morning and he requests that she be transferred to [facility] for the transfusion.  D/w Family medicine as she is [place] [facility] patient, family practice resident states that  they are capped for [place] patients.  D/w triad who will see patient in the ED for admission.   Labs Review Labs Reviewed  CBC - Abnormal; Notable for the following:    WBC 11.3 (*)    RBC 2.90 (*)    Hemoglobin 8.0 (*)    HCT 25.2 (*)    RDW 23.2 (*)    All other components within normal limits  BASIC METABOLIC PANEL - Abnormal; Notable for the following:    Sodium 136 (*)    Chloride 93 (*)    Glucose, Bld 123 (*)    BUN 32 (*)    Creatinine, Ser 1.45 (*)    GFR calc non Af Amer 37 (*)    GFR calc Af Amer 42 (*)    All other components within normal limits  CBG MONITORING, ED -  Abnormal; Notable for the following:    Glucose-Capillary 120 (*)    All other components within normal limits    Imaging Review Dg Bone Survey Met  04/13/2014   CLINICAL DATA:  Multiple myeloma  EXAM: METASTATIC BONE SURVEY  COMPARISON:  Nuclear medicine bone scan March 25, 2014  FINDINGS: Lateral skull: There are multiple subcentimeter lytic lesions throughout the skull consistent with multiple myeloma. Some appears normal.  AP and lateral cervical spine: Bones appear diffusely osteoporotic. There are multiple subtle lytic lesions throughout the cervical spine. No impending fracture. No fracture or spondylolisthesis.  AP and lateral thoracic spine: There are small lytic lesions in the T3, T4, T5, T6, T7, T8, T9, T10, T11, and T12 vertebral bodies, also subcentimeter. Bones are diffusely osteoporotic. There is slight anterior wedging of the T7 vertebral body. There is moderate generalized osteoarthritic change.  AP chest: There is mild atelectatic change in both lower lobes. There is no edema or consolidation. Heart size and pulmonary vascularity are normal. No adenopathy. Bones are somewhat osteoporotic. There is a small lytic lesion in the lateral left seventh rib. There is a small lytic lesion in the anterior right second rib.  AP and lateral lumbar spine: There is significant collapse of the L1 vertebral body.  There are equivocal tiny lytic lesions in the L3 vertebral body. There is subtle mottling in the L5 vertebral body concerning for neoplastic involvement.  Pelvis: There are lytic lesions in the right superior pubic ramus. There are lytic lesions in the intertrochanteric through femur regions bilaterally. There is moderate osteoarthritic change in both hip joints. There is a subtle lytic lesion in the lateral and mid right ischium. There is no fracture or impending fracture.  Right femur/tibia and fibula: Small lytic lesions are identified in the femoral neck region. There is a subtle small lytic lesion in the mid femur region. No fracture or impending fracture.  Left femur/tibia and fibula: There is a total knee replacement. Small lytic lesions are identified in the intertrochanteric region. No other lytic lesions appreciated. Periosteum intact.  Right shoulder, humerus, and forearm: There are subtle lytic lesions throughout the right scapula and proximal right humerus. No fracture or impending fracture.  Left shoulder, humerus, and forearm: There are multiple small lytic lesions in the scapula and proximal humerus. No fracture or impending fracture.  IMPRESSION: Multiple lytic lesions throughout the skeleton consistent with multiple myeloma. No impending fracture apparent. Note that there is that collapse of the L1 vertebral body. Bones are somewhat diffusely osteoporotic. The L5 vertebral body has a somewhat mottled appearance suggesting somewhat more extensive neoplastic involvement at this level compared to other levels.   Electronically Signed   By: Lowella Grip M.D.   On: 04/13/2014 15:02     EKG Interpretation None      MDM   Final diagnoses:  Epistaxis  Anemia    Pt presenting with recurrent epistaxis, she has been seen by Dr. Constance Holster in the ED tonight.  He has requested she be admitted overnight to hospitalist service- see above notes.  Pt has mild tachcardia, and her anemia is at  baseline.  Scheduled for blood transfusion tomorrow morning.  D/w triad who will see patient in the ED and either admit here or transfrer to [facility].      Threasa Beards, MD 04/14/14 [phone]

## 2014-04-13 NOTE — Consult Note (Signed)
Seen earlier in the day for right-sided epistaxis. Cauterized and packed in the office. Started bleeding again couple of hours later. She is scheduled to have blood transfusion tomorrow morning at [facility] and start on an experimental oral chemotherapy medication for multiple myeloma.  I have removed the packing from the right nasal cavity. I injected Xylocaine with epinephrine along the septum and inferior turbinate on the right side. Electric cautery unit was used with suction cautery to cauterize the right anterior septal bleeding site extensively until there was no further bleeding. All clots were suctioned out. No further bleeding. Hopefully, there will be no future problems. Given her schedule for tomorrow morning and her overall poor state of health, it would be helpful to have her admitted over to [facility] on the medical service until tomorrow morning. I will be available for any further bleeding.

## 2014-04-13 NOTE — Progress Notes (Signed)
No date for treatment as of today. °

## 2014-04-13 NOTE — ED Notes (Signed)
Dr. Linker at bedside  

## 2014-04-14 ENCOUNTER — Encounter (HOSPITAL_COMMUNITY): Payer: Self-pay | Admitting: *Deleted

## 2014-04-14 ENCOUNTER — Other Ambulatory Visit: Payer: Self-pay | Admitting: *Deleted

## 2014-04-14 ENCOUNTER — Non-Acute Institutional Stay (HOSPITAL_COMMUNITY): Admission: AD | Admit: 2014-04-14 | Payer: Medicare Other | Source: Ambulatory Visit | Admitting: Oncology

## 2014-04-14 DIAGNOSIS — R040 Epistaxis: Secondary | ICD-10-CM

## 2014-04-14 LAB — COMPREHENSIVE METABOLIC PANEL
ALBUMIN: 1.9 g/dL — AB (ref 3.5–5.2)
ALT: 6 U/L (ref 0–35)
AST: 22 U/L (ref 0–37)
Alkaline Phosphatase: 63 U/L (ref 39–117)
BUN: 32 mg/dL — AB (ref 6–23)
CALCIUM: 9.3 mg/dL (ref 8.4–10.5)
CO2: 23 mEq/L (ref 19–32)
CREATININE: 1.57 mg/dL — AB (ref 0.50–1.10)
Chloride: 96 mEq/L (ref 96–112)
GFR calc non Af Amer: 33 mL/min — ABNORMAL LOW (ref >90)
GFR, EST AFRICAN AMERICAN: 39 mL/min — AB (ref >90)
GLUCOSE: 118 mg/dL — AB (ref 70–99)
POTASSIUM: 4.8 meq/L (ref 3.7–5.3)
Sodium: 136 mEq/L — ABNORMAL LOW (ref 137–147)
TOTAL PROTEIN: 10.5 g/dL — AB (ref 6.0–8.3)
Total Bilirubin: 0.3 mg/dL (ref 0.3–1.2)

## 2014-04-14 LAB — SPEP & IFE WITH QIG
Albumin ELP: 23.8 % — ABNORMAL LOW (ref 55.8–66.1)
Alpha-1-Globulin: 2.8 % — ABNORMAL LOW (ref 2.9–4.9)
Alpha-2-Globulin: 4.8 % — ABNORMAL LOW (ref 7.1–11.8)
BETA 2: 62.3 % — AB (ref 3.2–6.5)
BETA GLOBULIN: 4.3 % — AB (ref 4.7–7.2)
Gamma Globulin: 2 % — ABNORMAL LOW (ref 11.1–18.8)
IGA: 7090 mg/dL — AB (ref 69–380)
IGG (IMMUNOGLOBIN G), SERUM: 340 mg/dL — AB (ref 690–1700)
IgM, Serum: 10 mg/dL — ABNORMAL LOW (ref 52–322)
M-SPIKE, %: 5.18 g/dL
Total Protein, Serum Electrophoresis: 11 g/dL — ABNORMAL HIGH (ref 6.0–8.3)

## 2014-04-14 LAB — CBC WITH DIFFERENTIAL/PLATELET
BASOS ABS: 0.1 10*3/uL (ref 0.0–0.1)
Basophils Relative: 1 % (ref 0–1)
Eosinophils Absolute: 0.2 10*3/uL (ref 0.0–0.7)
Eosinophils Relative: 3 % (ref 0–5)
HCT: 22.9 % — ABNORMAL LOW (ref 36.0–46.0)
HEMOGLOBIN: 7.2 g/dL — AB (ref 12.0–15.0)
LYMPHS PCT: 27 % (ref 12–46)
Lymphs Abs: 2 10*3/uL (ref 0.7–4.0)
MCH: 27.3 pg (ref 26.0–34.0)
MCHC: 31.4 g/dL (ref 30.0–36.0)
MCV: 86.7 fL (ref 78.0–100.0)
MONOS PCT: 11 % (ref 3–12)
Monocytes Absolute: 0.8 10*3/uL (ref 0.1–1.0)
Neutro Abs: 4.3 10*3/uL (ref 1.7–7.7)
Neutrophils Relative %: 58 % (ref 43–77)
Platelets: 149 10*3/uL — ABNORMAL LOW (ref 150–400)
RBC: 2.64 MIL/uL — ABNORMAL LOW (ref 3.87–5.11)
RDW: 23.5 % — AB (ref 11.5–15.5)
WBC: 7.4 10*3/uL (ref 4.0–10.5)

## 2014-04-14 LAB — TYPE AND SCREEN
ABO/RH(D): B POS
Antibody Screen: NEGATIVE
DAT, IgG: NEGATIVE
UNIT DIVISION: 0
Unit division: 0

## 2014-04-14 LAB — GLUCOSE, CAPILLARY
GLUCOSE-CAPILLARY: 126 mg/dL — AB (ref 70–99)
GLUCOSE-CAPILLARY: 330 mg/dL — AB (ref 70–99)
GLUCOSE-CAPILLARY: 356 mg/dL — AB (ref 70–99)
GLUCOSE-CAPILLARY: 336 mg/dL — AB (ref 70–99)
Glucose-Capillary: 107 mg/dL — ABNORMAL HIGH (ref 70–99)

## 2014-04-14 LAB — KAPPA/LAMBDA LIGHT CHAINS
KAPPA FREE LGHT CHN: 12.9 mg/dL — AB (ref 0.33–1.94)
KAPPA LAMBDA RATIO: 44.48 — AB (ref 0.26–1.65)
Lambda Free Lght Chn: 0.29 mg/dL — ABNORMAL LOW (ref 0.57–2.63)

## 2014-04-14 LAB — HEMOGLOBIN A1C
Hgb A1c MFr Bld: 6.2 % — ABNORMAL HIGH (ref <5.7)
Mean Plasma Glucose: 131 mg/dL — ABNORMAL HIGH (ref <117)

## 2014-04-14 LAB — PREPARE RBC (CROSSMATCH)

## 2014-04-14 LAB — BETA 2 MICROGLOBULIN, SERUM: BETA 2 MICROGLOBULIN: 19 mg/L — AB (ref <=2.51)

## 2014-04-14 LAB — PROTIME-INR
INR: 1.17 (ref 0.00–1.49)
PROTHROMBIN TIME: 14.7 s (ref 11.6–15.2)

## 2014-04-14 LAB — C-REACTIVE PROTEIN: CRP: <0.5 mg/dL (ref <0.60)

## 2014-04-14 MED ORDER — INSULIN ASPART 100 UNIT/ML ~~LOC~~ SOLN
0.0000 [IU] | Freq: Three times a day (TID) | SUBCUTANEOUS | Status: DC
  Administered 2014-04-14: 9 [IU] via SUBCUTANEOUS
  Administered 2014-04-14: 7 [IU] via SUBCUTANEOUS
  Administered 2014-04-15: 2 [IU] via SUBCUTANEOUS
  Administered 2014-04-15: 5 [IU] via SUBCUTANEOUS

## 2014-04-14 MED ORDER — FLUCONAZOLE 100 MG PO TABS
100.0000 mg | ORAL_TABLET | Freq: Every day | ORAL | Status: DC
  Administered 2014-04-14: 100 mg via ORAL
  Filled 2014-04-14: qty 1

## 2014-04-14 MED ORDER — NITROGLYCERIN 0.4 MG SL SUBL: 0.4000 mg | SUBLINGUAL_TABLET | SUBLINGUAL | Status: DC | PRN

## 2014-04-14 MED ORDER — SODIUM CHLORIDE 0.9 % IJ SOLN
3.0000 mL | Freq: Two times a day (BID) | INTRAMUSCULAR | Status: DC
  Administered 2014-04-14 – 2014-04-15 (×3): 3 mL via INTRAVENOUS

## 2014-04-14 MED ORDER — ACETAMINOPHEN 325 MG PO TABS
325.0000 mg | ORAL_TABLET | ORAL | Status: DC | PRN
  Administered 2014-04-15: 650 mg via ORAL
  Filled 2014-04-14: qty 2

## 2014-04-14 MED ORDER — SENNOSIDES-DOCUSATE SODIUM 8.6-50 MG PO TABS
2.0000 | ORAL_TABLET | Freq: Every day | ORAL | Status: DC
  Administered 2014-04-14 (×2): 2 via ORAL
  Filled 2014-04-14 (×3): qty 2

## 2014-04-14 MED ORDER — METOPROLOL TARTRATE 25 MG PO TABS
25.0000 mg | ORAL_TABLET | Freq: Two times a day (BID) | ORAL | Status: DC
  Administered 2014-04-14 – 2014-04-15 (×3): 25 mg via ORAL
  Filled 2014-04-14 (×5): qty 1

## 2014-04-14 MED ORDER — INSULIN ASPART 100 UNIT/ML ~~LOC~~ SOLN
0.0000 [IU] | Freq: Every day | SUBCUTANEOUS | Status: DC
  Administered 2014-04-14: 4 [IU] via SUBCUTANEOUS

## 2014-04-14 MED ORDER — TRAMADOL HCL 50 MG PO TABS
50.0000 mg | ORAL_TABLET | Freq: Four times a day (QID) | ORAL | Status: DC | PRN
  Administered 2014-04-14 – 2014-04-15 (×3): 50 mg via ORAL
  Filled 2014-04-14 (×4): qty 1

## 2014-04-14 MED ORDER — GLUCERNA SHAKE PO LIQD
237.0000 mL | Freq: Three times a day (TID) | ORAL | Status: DC
  Administered 2014-04-14 – 2014-04-15 (×3): 237 mL via ORAL

## 2014-04-14 MED ORDER — BIOTENE DRY MOUTH MT LIQD
15.0000 mL | Freq: Two times a day (BID) | OROMUCOSAL | Status: DC
  Administered 2014-04-14 – 2014-04-15 (×3): 15 mL via OROMUCOSAL

## 2014-04-14 MED ORDER — POLYETHYLENE GLYCOL 3350 17 G PO PACK
17.0000 g | PACK | Freq: Every day | ORAL | Status: DC
  Administered 2014-04-14: 17 g via ORAL
  Filled 2014-04-14 (×2): qty 1

## 2014-04-14 MED ORDER — ASPIRIN 325 MG PO TABS: 325.0000 mg | ORAL_TABLET | Freq: Every day | ORAL | Status: DC

## 2014-04-14 MED ORDER — ADULT MULTIVITAMIN W/MINERALS CH
1.0000 | ORAL_TABLET | Freq: Every day | ORAL | Status: DC
  Administered 2014-04-14: 1 via ORAL
  Filled 2014-04-14 (×3): qty 1

## 2014-04-14 MED ORDER — ACETAMINOPHEN 325 MG PO TABS
650.0000 mg | ORAL_TABLET | Freq: Once | ORAL | Status: AC
  Administered 2014-04-14: 650 mg via ORAL
  Filled 2014-04-14: qty 2

## 2014-04-14 MED ORDER — FUROSEMIDE 20 MG PO TABS
20.0000 mg | ORAL_TABLET | Freq: Once | ORAL | Status: AC
  Administered 2014-04-14: 20 mg via ORAL
  Filled 2014-04-14: qty 1

## 2014-04-14 MED ORDER — AMOXICILLIN-POT CLAVULANATE 875-125 MG PO TABS
1.0000 | ORAL_TABLET | Freq: Two times a day (BID) | ORAL | Status: DC
  Administered 2014-04-14 – 2014-04-15 (×3): 1 via ORAL
  Filled 2014-04-14 (×5): qty 1

## 2014-04-14 MED ORDER — OCUVITE PO TABS
1.0000 | ORAL_TABLET | Freq: Every day | ORAL | Status: DC
  Administered 2014-04-14 – 2014-04-15 (×2): 1 via ORAL
  Filled 2014-04-14 (×3): qty 1

## 2014-04-14 MED ORDER — ASPIRIN EC 81 MG PO TBEC
81.0000 mg | DELAYED_RELEASE_TABLET | Freq: Every day | ORAL | Status: DC
  Administered 2014-04-14 – 2014-04-15 (×2): 81 mg via ORAL
  Filled 2014-04-14 (×2): qty 1

## 2014-04-14 MED ORDER — NYSTATIN 100000 UNIT/GM EX POWD
1.0000 g | Freq: Three times a day (TID) | CUTANEOUS | Status: DC
  Administered 2014-04-14 – 2014-04-15 (×5): 1 g via TOPICAL
  Filled 2014-04-14: qty 15

## 2014-04-14 MED ORDER — METHOCARBAMOL 500 MG PO TABS
500.0000 mg | ORAL_TABLET | Freq: Four times a day (QID) | ORAL | Status: DC | PRN
  Filled 2014-04-14: qty 1

## 2014-04-14 MED ORDER — DIPHENHYDRAMINE HCL 25 MG PO CAPS
25.0000 mg | ORAL_CAPSULE | Freq: Once | ORAL | Status: AC
  Administered 2014-04-14: 25 mg via ORAL
  Filled 2014-04-14: qty 1

## 2014-04-14 MED ORDER — ENSURE COMPLETE PO LIQD
237.0000 mL | Freq: Two times a day (BID) | ORAL | Status: DC
  Administered 2014-04-14: 237 mL via ORAL

## 2014-04-14 MED ORDER — ZOLEDRONIC ACID 4 MG/5ML IV CONC
4.0000 mg | Freq: Once | INTRAVENOUS | Status: AC
  Administered 2014-04-14: 4 mg via INTRAVENOUS
  Filled 2014-04-14 (×2): qty 5

## 2014-04-14 MED ORDER — DEXAMETHASONE 6 MG PO TABS
20.0000 mg | ORAL_TABLET | ORAL | Status: DC
  Administered 2014-04-14 – 2014-04-15 (×2): 20 mg via ORAL
  Filled 2014-04-14 (×2): qty 1

## 2014-04-14 NOTE — H&P (Signed)
Hospitalist Admission History and Physical  Patient name: [name] Medical record number: [id number] Date of birth: 1946/03/14 Age: 68 y.o. Gender: female  Primary Care Provider: Jenny Reichmann, MD  Chief Complaint: recurrent epistaxis  History of Present Illness:This is a 68 y.o. year old female with fairly complicated recent medical history including PE s/p L TKR 09/2013, hemoptysis in setting of supratherapeutic INR, recurrent epistaxis, recent dx/o multiple myeloma hx/o CADs/p stents and type 2 DM presenting with recurrent epistaxis. Pt was initially had severe episode of epistaxis last week.  Pt has been formally transitioned from coumadin to ASA in setting of hemoptysis. Was admitted to [facility] service 4/7-4/14 for hemoptysis. Please see discharge summary for full details. Was seen by [facility] ENT. Area was packed. Pt went to ENT office for recheck. Has nose bleed in office. R nare. Had are cauterized at the bedside. Pt had recurrence of epistaxis before leaving office. Nose was re-cauterized. Pt was instructed to go to ER if epistaxis recurred. Pt had recurrence of epistaxis later in evening.Pt was seen by ENT in ER. Affected area cauterized again. Pt was due for blood transfusion at [facility] in am. ENT requesting inpt admission for obs and am blood transfusion.  Hemodynamically stable on presentation. Mild tachycardia into 120s. Satting 90-95% on RA. Hgb 8.3.   Patient Active Problem List   Diagnosis Date Noted  . Epistaxis 04/14/2014  . Multiple myeloma, without mention of having achieved remission 04/07/2014  . Physical deconditioning 03/31/2014  . Hypercalcemia 03/25/2014  . Major hemoptysis 03/25/2014  . Acute blood loss anemia 03/25/2014  . Vertebral fracture, closed 03/25/2014  . Hemoptysis 03/24/2014  . Pneumonia, organism unspecified 03/24/2014  . Encounter for therapeutic drug monitoring 01/14/2014  . Fe deficiency anemia 12/31/2013  . Long term (current) use of  anticoagulants 09/24/2013  . Pulmonary embolism 09/18/2013  . Acute pulmonary embolism 09/17/2013  . Hypoxemia 09/17/2013  . Onychomycosis 04/29/2013  . Pain in joint, ankle and foot 04/29/2013  . Benign hypertensive heart disease without heart failure 10/15/2012  . STEMI (ST elevation myocardial infarction) 05/30/2012  . Dyspnea 11/15/2011  . Herpes zoster 07/17/2011  . Fatigue 03/10/2011  . Chest pain at rest 03/10/2011  . Acute sinusitis, unspecified 03/10/2011  . Neurofibromatosis   . Type II or unspecified type diabetes mellitus without mention of complication, uncontrolled   . HTN (hypertension)   . Obesity   . Osteoarthritis, knee    Past Medical History: Past Medical History  Diagnosis Date  . Neurofibromatosis   . Diabetes mellitus   . HTN (hypertension)   . Obesity   . Osteoarthritis, knee   . Hyperlipidemia     statin intolerant. LDL is 83  . UTI (lower urinary tract infection) 05/29/12  . 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 Pulmonary embolism [id number]    Past Surgical History: Past Surgical History  Procedure Laterality Date  . Cardiac catheterization    . Tonsillectomy and adenoidectomy    . Clavicle surgery    . Abdominal hysterectomy    . Ankle fracture surgery Right   . Total knee arthroplasty Left 09/15/2013    Procedure: TOTAL KNEE ARTHROPLASTY;  Surgeon: Vickey Huger, MD;  Location: [place];  Service: Orthopedics;  Laterality: Left;    Social History: History   Social History  . Marital Status: Married    Spouse Name: N/A    Number of Children: N/A  .  Years of Education: N/A   Social History Main Topics  . Smoking status: Never Smoker   . Smokeless tobacco: Never Used  . Alcohol Use: No  . Drug Use: No  . Sexual Activity: Not Currently   Other Topics Concern  . None   Social History Narrative  . None    Family History: Family History  Problem Relation Age of Onset  .  Heart disease Mother   . Arthritis Mother   . Stroke Mother   . Heart disease Son     Allergies: Allergies  Allergen Reactions  . Codeine Nausea And Vomiting    Sees things  . Hydrocodone Nausea And Vomiting    Sees things  . Niaspan [Niacin Er] Nausea Only  . Robaxin [Methocarbamol] Other (See Comments)    Made patient feel funny  . Statins Nausea And Vomiting and Other (See Comments)    Myalgias/leg pain with atorvastatin, rosuvastatin, lovastatin, simvastatin  . Tetracycline Other (See Comments)    Pt doesn't remember reaction  . Zetia [Ezetimibe] Nausea And Vomiting  . Zithromax [Azithromycin] Nausea And Vomiting    Current Facility-Administered Medications  Medication Dose Route Frequency Provider Last Rate Last Dose  . acetaminophen (TYLENOL) tablet 325-650 mg  325-650 mg Oral Q4H PRN Shanda Howells, MD      . amoxicillin-clavulanate (AUGMENTIN) 875-125 MG per tablet 1 tablet  1 tablet Oral BID Shanda Howells, MD      . aspirin tablet 325 mg  325 mg Oral Daily Shanda Howells, MD      . beta carotene w/minerals (OCUVITE) tablet 1 tablet  1 tablet Oral Daily Shanda Howells, MD      . fluconazole (DIFLUCAN) tablet 100 mg  100 mg Oral Daily Shanda Howells, MD      . methocarbamol (ROBAXIN) tablet 500 mg  500 mg Oral Q6H PRN Shanda Howells, MD      . metoprolol tartrate (LOPRESSOR) tablet 25 mg  25 mg Oral BID Shanda Howells, MD      . multivitamin with minerals tablet 1 tablet  1 tablet Oral QHS Shanda Howells, MD      . nitroGLYCERIN (NITROSTAT) SL tablet 0.4 mg  0.4 mg Sublingual Q5 min PRN Shanda Howells, MD      . nystatin (MYCOSTATIN/NYSTOP) topical powder 1 g  1 g Topical TID Shanda Howells, MD      . polyethylene glycol (MIRALAX / GLYCOLAX) packet 17 g  17 g Oral Daily Shanda Howells, MD      . sennosides-docusate sodium (SENOKOT-S) 8.6-50 MG tablet 2 tablet  2 tablet Oral QHS Shanda Howells, MD      . sodium chloride 0.9 % injection 3 mL  3 mL Intravenous Q12H Shanda Howells, MD       . traMADol Veatrice Bourbon) tablet 50 mg  50 mg Oral Q6H PRN Shanda Howells, MD       Current Outpatient Prescriptions  Medication Sig Dispense Refill  . acetaminophen (TYLENOL) 325 MG tablet Take 1-2 tablets (325-650 mg total) by mouth every 4 (four) hours as needed for mild pain.      Marland Kitchen amoxicillin-clavulanate (AUGMENTIN) 875-125 MG per tablet Take 1 tablet by mouth 2 (two) times daily. 7 day therapy course patient began on 04/09/14      . aspirin 325 MG tablet Take 325 mg by mouth daily.      Marland Kitchen dexamethasone (DECADRON) 4 MG tablet Take 5 tablets (20 mg total) by mouth as directed.  40 tablet  6  .  glimepiride (AMARYL) 2 MG tablet Take 2 mg by mouth daily with breakfast.      . methocarbamol (ROBAXIN) 500 MG tablet Take 500 mg by mouth every 6 (six) hours as needed for muscle spasms.      . metoprolol tartrate (LOPRESSOR) 25 MG tablet Take 25 mg by mouth 2 (two) times daily.      . Multiple Vitamin (MULTIVITAMIN WITH MINERALS) TABS tablet Take 1 tablet by mouth at bedtime. Centrum Silver      . Multiple Vitamins-Minerals (OCUVITE PO) Take 1 tablet by mouth daily.       . nitroGLYCERIN (NITROSTAT) 0.4 MG SL tablet Place 1 tablet (0.4 mg total) under the tongue every 5 (five) minutes as needed for chest pain (up to 3 doses).  25 tablet  3  . nystatin (MYCOSTATIN/NYSTOP) 100000 UNIT/GM POWD Apply 1 g topically 3 (three) times daily.      . polyethylene glycol (MIRALAX / GLYCOLAX) packet Take 17 g by mouth daily.      . sennosides-docusate sodium (SENOKOT-S) 8.6-50 MG tablet Take 2 tablets by mouth at bedtime.      . traMADol (ULTRAM) 50 MG tablet Take 50 mg by mouth every 6 (six) hours as needed for moderate pain.       . fluconazole (DIFLUCAN) 100 MG tablet Take 100 mg by mouth daily.       Facility-Administered Medications Ordered in Other Encounters  Medication Dose Route Frequency Provider Last Rate Last Dose  . 0.45 % sodium chloride infusion   Intravenous Continuous Virgie Dad Magrinat, MD      .  sodium chloride 0.9 % injection 3 mL  3 mL Intracatheter PRN Amy Milda Smart, PA-C       Review Of Systems: 12 point ROS negative except as noted above in HPI.  Physical Exam: Filed Vitals:   04/14/14 0004  BP: 132/57  Pulse: 116  Temp: 98 F (36.7 C)  Resp: 20    General: alert and cooperative HEENT: PERRLA, extra ocular movement intact and R nare cauterized Heart: S1, S2 normal, no murmur, rub or gallop, regular rate and rhythm Lungs: clear to auscultation, no wheezes or rales and unlabored breathing Abdomen: abdomen is soft without significant tenderness, masses, organomegaly or guarding Extremities: extremities normal, atraumatic, no cyanosis or edema Skin:no rashes, no ecchymoses Neurology: normal without focal findings  Labs and Imaging: Lab Results  Component Value Date/Time   NA 136* 04/13/2014 10:13 PM   NA 137 04/08/2014 11:36 AM   K 4.4 04/13/2014 10:13 PM   K 5.3* 04/08/2014 11:36 AM   CL 93* 04/13/2014 10:13 PM   CO2 21 04/13/2014 10:13 PM   CO2 21* 04/08/2014 11:36 AM   BUN 32* 04/13/2014 10:13 PM   BUN 28.4* 04/08/2014 11:36 AM   CREATININE 1.45* 04/13/2014 10:13 PM   CREATININE 1.6* 04/08/2014 11:36 AM   CREATININE 0.81 12/09/2013 10:00 AM   GLUCOSE 123* 04/13/2014 10:13 PM   GLUCOSE 166* 04/08/2014 11:36 AM   Lab Results  Component Value Date   WBC 11.3* 04/13/2014   HGB 8.0* 04/13/2014   HCT 25.2* 04/13/2014   MCV 86.9 04/13/2014   PLT 154 04/13/2014    Dg Bone Survey Met  04/13/2014   CLINICAL DATA:  Multiple myeloma  EXAM: METASTATIC BONE SURVEY  COMPARISON:  Nuclear medicine bone scan March 25, 2014  FINDINGS: Lateral skull: There are multiple subcentimeter lytic lesions throughout the skull consistent with multiple myeloma. Some appears normal.  AP and lateral  cervical spine: Bones appear diffusely osteoporotic. There are multiple subtle lytic lesions throughout the cervical spine. No impending fracture. No fracture or spondylolisthesis.  AP and lateral thoracic  spine: There are small lytic lesions in the T3, T4, T5, T6, T7, T8, T9, T10, T11, and T12 vertebral bodies, also subcentimeter. Bones are diffusely osteoporotic. There is slight anterior wedging of the T7 vertebral body. There is moderate generalized osteoarthritic change.  AP chest: There is mild atelectatic change in both lower lobes. There is no edema or consolidation. Heart size and pulmonary vascularity are normal. No adenopathy. Bones are somewhat osteoporotic. There is a small lytic lesion in the lateral left seventh rib. There is a small lytic lesion in the anterior right second rib.  AP and lateral lumbar spine: There is significant collapse of the L1 vertebral body. There are equivocal tiny lytic lesions in the L3 vertebral body. There is subtle mottling in the L5 vertebral body concerning for neoplastic involvement.  Pelvis: There are lytic lesions in the right superior pubic ramus. There are lytic lesions in the intertrochanteric through femur regions bilaterally. There is moderate osteoarthritic change in both hip joints. There is a subtle lytic lesion in the lateral and mid right ischium. There is no fracture or impending fracture.  Right femur/tibia and fibula: Small lytic lesions are identified in the femoral neck region. There is a subtle small lytic lesion in the mid femur region. No fracture or impending fracture.  Left femur/tibia and fibula: There is a total knee replacement. Small lytic lesions are identified in the intertrochanteric region. No other lytic lesions appreciated. Periosteum intact.  Right shoulder, humerus, and forearm: There are subtle lytic lesions throughout the right scapula and proximal right humerus. No fracture or impending fracture.  Left shoulder, humerus, and forearm: There are multiple small lytic lesions in the scapula and proximal humerus. No fracture or impending fracture.  IMPRESSION: Multiple lytic lesions throughout the skeleton consistent with multiple myeloma.  No impending fracture apparent. Note that there is that collapse of the L1 vertebral body. Bones are somewhat diffusely osteoporotic. The L5 vertebral body has a somewhat mottled appearance suggesting somewhat more extensive neoplastic involvement at this level compared to other levels.   Electronically Signed   By: Lowella Grip M.D.   On: 04/13/2014 15:02     Assessment and Plan: [name] is a 68 y.o. year old female presenting with recurrent epistaxis  Epistaxis: Stabilized s/p multiple cauteries today. Continue to follow. Consult ENT as clinically indicated. Decreased ASA to 81 mg. Continue augmentin.   Multiple myeloma: pending PRBc transfusion in am. Proceed with this while in house. Consider H-O consult in house as clinically indicated. Bone scan imaging as above. Continue decadron.   Hx/o PE: 6+ months out from initial dx, though with recent anticoagulation complications. Contraindication given recent hemoptysis and epistaxis. Was started on full dose ASA by H-O last week.,  but this seems to have had some adverse complications as well. Decrease ASA to baby dosing given above. Stable from a resp standpoint.   HTN/CAD: hemodynamically stable. No active CP. Continue home meds.   DM: SSI, A1C  FEN/GI: full liquid diet as cautery stablizes. PPI.  Prophylaxis: SCDs.   Disposition: pending further evaluation  Code Status:Full Code        Shanda Howells MD  Pager: [phone]

## 2014-04-14 NOTE — Progress Notes (Signed)
Family Medicine Teaching Service Daily Progress Note Intern Pager: [phone]  Patient name: [name] Medical record number: [id number] Date of birth: [birth year] Age: 68 y.o. Gender: female  Primary Care Provider: Lucilla Edin, MD Consultants: ENT Code Status: full  Assessment and Plan:  # Epistaxis: patient with recurrent epistaxis despite several packings and cautery attempts. Now with no bleeding s/p cautery in the ED by ENT. This is in setting of increase in aspirin dosage, following d/c of coumadin due to hemoptysis. -appreciate ENTs assistance in halting current nose bleed -appreciate [facility]'s Dr Alvester Morin in admitting the patient -will continue to monitor for bleeding -contact ENT if rebleed occurs -aspirin to 81 mg on admission -continued on augmentin  # Multiple Myeloma: recent diagnosis now followed by Dr Darnelle Catalan. Note patient to reported to have blood transfusion today. -will continue outpatient decadron -will touch base with the cancer center in the morning regarding the blood transfusion and potential consult -plan for transfusion in the am - appears that they were going to transfuse 2 units this week -additionally they were planning to obtain an echo, ?utility of obtaining this in the hospital  # HTN: controlled on admission. -will continue home metoprolol -vitals per unit protocol  # Tachycardia: to 120's on admission. No EKG in the ED. Likely related to anemia in combination with anxiety from being in the ED and having recurrent nose bleeds. -will monitor on tele -will give dose of metoprolol  -consider EKG if not improving with dose of metoprolol  # Hx of PE: >6 months ago had PE associated with surgery. Had recent hospitalization for hemoptysis relating to supratherapeutic INR and was transitioned off coumadin. Now on aspirin 325 mg for this. Currently stable from respiratory perspective. -given recurrent epistaxis, will decrease aspirin to 81 mg daily -need  to touch base with heme-onc regarding this issue  # Back pain: likely stemming from recent vertebral compression fracture.  -will continue robaxin and tramadol -continue to monitor for pain  # DM: last A1c was 7.1 10/08/13 -hold home amaryl -SSI and cbgs qac and qhs  # Constipation: will continue home senokot and miralax  FEN/GI: full liquid diet PPx: scd's given recurrent epistaxis  Disposition: care to be assumed by family medicine teaching service given recent hospitalization with our service, discharge pending blood transfusion in the morning  Subjective:  Patient admitted over night by [facility] and found to be a bounce back to our service. Seen at 1:45 am shortly after admission note from Dr Alvester Morin. Patient reports recent recurrent epistaxis starting last Thursday. She reports being seen in the ED several times for this and in the ENT office on 04/13/14 for this. She notes having been transitioned off coumadin to aspirin about 2 weeks ago and was recently increased to 325 mg daily for prevention of venous thrombosis. Today she noted recurrence of epistaxis of her right nostril and she presented to the ENT office for evaluation. There she initially had it packed, though bled around the packing so had cautery done. This worked for a short period of time, though on leaving the office the patient noted recurrence of her epistaxis and returned to the area cauterized again. She was then advised to go to the ED if there was any recurrence of the epistaxis.several hours after leaving the ENT office she noted recurrence of the epistaxis and came to the ED. She was seen by ENT in the ED and had xylocaine with epi injected to the right nasal septum and had  suction cautery to the area as well with no evidence of further bleeding. The patient was scheduled to be seen at [facility] for a blood transfusion on 04/14/14 and given this the ENT physician felt it prudent that she be admitted to the hospital for  observation pending her blood transfusion in the am.  Patient denies complaints of chest pain and shortness of breath.  Objective: Temp:  [97.6 F (36.4 C)-98.5 F (36.9 C)] 97.6 F (36.4 C) (04/28 0149) Pulse Rate:  [102-126] 113 (04/28 0149) Resp:  [18-22] 18 (04/28 0149) BP: (116-148)/(53-83) 148/77 mmHg (04/28 0149) SpO2:  [90 %-95 %] 92 % (04/28 0149) Weight:  [173 lb 1.6 oz (78.518 kg)-182 lb (82.555 kg)] 173 lb 1.6 oz (78.518 kg) (04/28 0149) Physical Exam: General: NAD, resting comfortably in bed HEENT: NCAT, right nare with evidence of dried blood, [place] in place, PERRL, MMM Cardiovascular: tachycardic, regular rhythm, no murmurs noted Respiratory: CTAB, no wheezes or crackles Abdomen: s, without significant tenderness or distension Extremities: no edema Skin: no lesions noted Neuro: alert, no focal findings  Laboratory:  Recent Labs Lab 04/10/14 1426 04/12/14 1635 04/13/14 2213  WBC 9.0 9.0 11.3*  HGB 8.1* 8.3* 8.0*  HCT 26.6* 26.2* 25.2*  PLT 165 162 154    Recent Labs Lab 04/08/14 1136 04/09/14 1241 04/13/14 2213  NA 137 137 136*  K 5.3* 4.9 4.4  CL  --  97 93*  CO2 21* 22 21  BUN 28.4* 31* 32*  CREATININE 1.6* 1.53* 1.45*  CALCIUM 9.8 9.6 9.7  PROT 11.7* 11.1*  --   BILITOT 0.31 0.4  --   ALKPHOS 65 61  --   ALT 8 7  --   AST 41* 24  --   GLUCOSE 166* 152* 123*    Imaging/Diagnostic Tests: Dg Bone Survey Met  04/13/2014   IMPRESSION: Multiple lytic lesions throughout the skeleton consistent with multiple myeloma. No impending fracture apparent. Note that there is that collapse of the L1 vertebral body. Bones are somewhat diffusely osteoporotic. The L5 vertebral body has a somewhat mottled appearance suggesting somewhat more extensive neoplastic involvement at this level compared to other levels.    Leone Haven, MD 04/14/2014, 1:53 AM PGY-2, [place] Intern pager: [phone], text pages welcome

## 2014-04-14 NOTE — Progress Notes (Signed)
Clarified with DR.Sonnenberg regarding blood transfusion ordered & he said he will clarify it in the morning with the Oncologist before transfusing blood.MD stated that he did not know if he will receive the BT here in [facility] or in [place].

## 2014-04-14 NOTE — Progress Notes (Signed)
NURSING PROGRESS NOTE  QUETZALY EBNER [id number] Admission Data: 04/14/2014 7:43 AM Attending Provider: Blane Ohara McDiarmid, MD QVZ:DGLO, Lina Sayre, MD Code Status: FULL CODE  LEMMIE STEINHAUS is a 68 y.o. female patient admitted from ED:  -No acute distress noted.  -No complaints of shortness of breath.  -No complaints of chest pain.   Cardiac Monitoring: Box # TX #13 in place. Cardiac monitor yields:ST .  Blood pressure 144/78, pulse 99, temperature 98.3 F (36.8 C), temperature source Oral, resp. rate 18, height 5\' 2"  (1.575 m), weight 78.518 kg (173 lb 1.6 oz), SpO2 95.00%.   IV Fluids:  IV in place, occlusive dsg intact without redness, IV cath forearm right, condition patent and no redness none.   Allergies:  Codeine; Hydrocodone; Niaspan; Robaxin; Statins; Tetracycline; Zetia; and Zithromax  Past Medical History:   has a past medical history of Neurofibromatosis; Diabetes mellitus; HTN (hypertension); Obesity; Osteoarthritis, knee; Hyperlipidemia; UTI (lower urinary tract infection) (05/29/12); STEMI (ST elevation myocardial infarction); and Pulmonary embolism ([id number]).  Past Surgical History:   has past surgical history that includes Cardiac catheterization; Tonsillectomy and adenoidectomy; Clavicle surgery; Abdominal hysterectomy; Ankle fracture surgery (Right); and Total knee arthroplasty (Left, 09/15/2013).  Social History:   reports that she has never smoked. She has never used smokeless tobacco. She reports that she does not drink alcohol or use illicit drugs.  Skin: NSI  Patient/Family orientated to room. Information packet given to patient/family. Admission inpatient armband information verified with patient/family to include name and date of birth and placed on patient arm. Side rails up x 2, fall assessment and education completed with patient/family. Patient/family able to verbalize understanding of risk associated with falls and verbalized understanding to call for  assistance before getting out of bed. Call light within reach. Patient/family able to voice and demonstrate understanding of unit orientation instructions.    Will continue to evaluate and treat per MD orders.

## 2014-04-14 NOTE — Progress Notes (Signed)
UR Completed.  Manon Banbury Jane Destanee Bedonie [phone] 04/14/2014  

## 2014-04-14 NOTE — Progress Notes (Signed)
INITIAL NUTRITION ASSESSMENT  DOCUMENTATION CODES Per approved criteria  -Severe malnutrition in the context of chronic illness -Obesity Unspecified   INTERVENTION: Add Ensure Complete po BID, each supplement provides 350 kcal and 13 grams of protein. Encouraged adequate kcal and protein intake. RD to continue to follow nutrition care plan.  NUTRITION DIAGNOSIS: Inadequate oral intake related to poor appetite as evidenced by patient report and weight loss.   Goal: Intake to meet >90% of estimated nutrition needs.  Monitor:  weight trends, lab trends, I/O's, PO intake, supplement tolerance, further diet advancement  Reason for Assessment: Malnutrition Screening Tool  68 y.o. female  Admitting Dx: Epistaxis  ASSESSMENT: PMHx significant for PE s/p TKR, hemoptysis, recurrent epistaxis, recent multiple myeloma dx, DM2. Admitted with epistaxis.   Currently ordered for Full Liquid diet. Pt reports that she is tolerating well and is ready to advance. Admits to poor appetite over the past several weeks 2/2 chronic medical issues. Does not skip meals, but eats less at meal times. Pt has had 9% wt loss this month, which is significant for time frame. Physical exam completed - and of note, pt with moderate temporal wasting.   Per chart review, pt was eating 75-100% from 4/14 - 4/21, but eating 50% or less from 4/7 - 4/14.  Pt meets criteria for severe MALNUTRITION in the context of chronic illness as evidenced by <75% x at least 1 month and 9% wt loss x 1 month, pt also with moderate muscle wasting.   Height: Ht Readings from Last 1 Encounters:  04/14/14 $RemoveB'5\' 2"'ALChHbLl$  (1.575 m)    Weight: Wt Readings from Last 1 Encounters:  04/14/14 173 lb 1.6 oz (78.518 kg)    Ideal Body Weight: 110 lb  % Ideal Body Weight: 157%  Wt Readings from Last 10 Encounters:  04/14/14 173 lb 1.6 oz (78.518 kg)  04/08/14 182 lb (82.555 kg)  04/06/14 1182 lb 3.2 oz (536.242 kg)  03/31/14 181 lb 1.6 oz  (82.146 kg)  03/21/14 190 lb (86.183 kg)  02/11/14 194 lb 8 oz (88.225 kg)  12/31/13 194 lb (87.998 kg)  12/23/13 195 lb (88.451 kg)  12/21/13 191 lb 9.6 oz (86.909 kg)  12/09/13 192 lb (87.091 kg)    Usual Body Weight: 190 lb  % Usual Body Weight: 91%  BMI:  Body mass index is 31.65 kg/(m^2). Obese Class I  Estimated Nutritional Needs: Kcal: 1600 - 1800 Protein: 75 - 90 g Fluid: 1.6 - 1.8 liters daily  Skin: intact  Diet Order: Full Liquid  EDUCATION NEEDS: -Education needs addressed  No intake or output data in the 24 hours ending 04/14/14 1007  Last BM: 4/27  Labs:   Recent Labs Lab 04/09/14 1241 04/13/14 2213 04/14/14 0630  NA 137 136* 136*  K 4.9 4.4 4.8  CL 97 93* 96  CO2 $Re'22 21 23  'hNx$ BUN 31* 32* 32*  CREATININE 1.53* 1.45* 1.57*  CALCIUM 9.6 9.7 9.3  GLUCOSE 152* 123* 118*    CBG (last 3)   Recent Labs  04/13/14 2326 04/14/14 0142 04/14/14 0748  GLUCAP 120* 126* 107*    Scheduled Meds: . amoxicillin-clavulanate  1 tablet Oral BID  . antiseptic oral rinse  15 mL Mouth Rinse BID  . aspirin EC  81 mg Oral Daily  . beta carotene w/minerals  1 tablet Oral Daily  . dexamethasone  20 mg Oral Once per day on Tue Wed  . feeding supplement (ENSURE COMPLETE)  237 mL Oral BID BM  .  fluconazole  100 mg Oral Daily  . insulin aspart  0-5 Units Subcutaneous QHS  . insulin aspart  0-9 Units Subcutaneous TID WC  . metoprolol tartrate  25 mg Oral BID  . multivitamin with minerals  1 tablet Oral QHS  . nystatin  1 g Topical TID  . polyethylene glycol  17 g Oral Daily  . senna-docusate  2 tablet Oral QHS  . sodium chloride  3 mL Intravenous Q12H    Continuous Infusions:   Past Medical History  Diagnosis Date  . Neurofibromatosis   . Diabetes mellitus   . HTN (hypertension)   . Obesity   . Osteoarthritis, knee   . Hyperlipidemia     statin intolerant. LDL is 83  . UTI (lower urinary tract infection) 05/29/12  . STEMI (ST elevation myocardial  infarction)     Inferolateral STEMI 05/29/12 s/p DES to RCA, nl EF  . Pulmonary embolism [id number]    Past Surgical History  Procedure Laterality Date  . Cardiac catheterization    . Tonsillectomy and adenoidectomy    . Clavicle surgery    . Abdominal hysterectomy    . Ankle fracture surgery Right   . Total knee arthroplasty Left 09/15/2013    Procedure: TOTAL KNEE ARTHROPLASTY;  Surgeon: Vickey Huger, MD;  Location: [facility];  Service: Orthopedics;  Laterality: Left;    Inda Coke MS, RD, LDN Inpatient Registered Dietitian Pager: [phone] After-hours pager: [phone]

## 2014-04-14 NOTE — Progress Notes (Signed)
Interim progress note Patient to be transfused 2uPRBCs (1st unit to be followed by 20mg  lasix PO), will monitor pt's creatinine closely Pt also to receive 4mg  zometa for hypercalcemia of malignancy Repeat post transfusion CBC And CMET tmw morning  Bernadene Bell, MD Family Medicine PGY-1 Please page or call with questions

## 2014-04-14 NOTE — Progress Notes (Signed)
FMTS ATTENDING  NOTE Keva Darty,MD I  have seen and examined this patient, reviewed their chart. I have discussed this patient with the resident. I agree with the resident's findings, assessment and care plan.  Patient this morning denies any further nose bleed, still feels a little weak. She endorsed lower back pain and some abdominal pain, she denies any N/V, no change in her bowel movement. She stated she has appointment at the cancer center today for her blood transfusion but will like it done while she is in the hospital.  Filed Vitals:   04/14/14 0532 04/14/14 0957 04/14/14 1348 04/14/14 1417  BP: 144/78 142/78 120/68 127/62  Pulse: 99 113 100 103  Temp: 98.3 F (36.8 C)  97.5 F (36.4 C) 97.8 F (36.6 C)  TempSrc: Oral  Oral Oral  Resp: $Remo'18  20 20  'aWhdF$ Height:      Weight:      SpO2: 95%  94% 93%   Exam: Gen: Not in distress. Resp: Air entry equal and clear B/L. CV; S1 S2 no murmurs. Abd: Benign. Ext: No edema.  A/P: 1. Epistaxis: Resolved s/p cauterization by ENT.     Anticoagulant d/c.     I agree with low dose aspirin to achieve anticoagulation.     Continue to monitor for bleeding.  2. HTN: BP looks ok.  3. Multiple myeloma:    Plan to get blood transfusion today.    Continue other outpatient medication regimen.    F/U at the cancer center upon d/c.  4. Back pain: Likely due to compression fracture.    Pain control as needed.  5. Tachycardia: sinus rhythm.     Metoprolol 25 mg BID.     Telemetry monitoring recommended.  6. PE: Unfortunately we cannot continue anticoagulant due to repeat bleeding episode.    ASA 81 mg qd for now.

## 2014-04-15 ENCOUNTER — Encounter (HOSPITAL_COMMUNITY): Payer: Self-pay

## 2014-04-15 DIAGNOSIS — C9000 Multiple myeloma not having achieved remission: Secondary | ICD-10-CM

## 2014-04-15 DIAGNOSIS — M8448XA Pathological fracture, other site, initial encounter for fracture: Secondary | ICD-10-CM

## 2014-04-15 DIAGNOSIS — D649 Anemia, unspecified: Secondary | ICD-10-CM

## 2014-04-15 DIAGNOSIS — D689 Coagulation defect, unspecified: Secondary | ICD-10-CM

## 2014-04-15 LAB — CBC WITH DIFFERENTIAL/PLATELET
BASOS ABS: 0.1 10*3/uL (ref 0.0–0.1)
Basophils Relative: 0 % (ref 0–1)
EOS ABS: 0.1 10*3/uL (ref 0.0–0.7)
Eosinophils Relative: 1 % (ref 0–5)
HCT: 27.1 % — ABNORMAL LOW (ref 36.0–46.0)
Hemoglobin: 8.8 g/dL — ABNORMAL LOW (ref 12.0–15.0)
Lymphocytes Relative: 16 % (ref 12–46)
Lymphs Abs: 1.8 10*3/uL (ref 0.7–4.0)
MCH: 27.8 pg (ref 26.0–34.0)
MCHC: 32.5 g/dL (ref 30.0–36.0)
MCV: 85.8 fL (ref 78.0–100.0)
MONOS PCT: 7 % (ref 3–12)
Monocytes Absolute: 0.8 10*3/uL (ref 0.1–1.0)
Neutro Abs: 8.6 10*3/uL — ABNORMAL HIGH (ref 1.7–7.7)
Neutrophils Relative %: 76 % (ref 43–77)
Platelets: 129 10*3/uL — ABNORMAL LOW (ref 150–400)
RBC: 3.16 MIL/uL — ABNORMAL LOW (ref 3.87–5.11)
RDW: 20.7 % — AB (ref 11.5–15.5)
WBC: 11.3 10*3/uL — ABNORMAL HIGH (ref 4.0–10.5)

## 2014-04-15 LAB — COMPREHENSIVE METABOLIC PANEL
ALK PHOS: 72 U/L (ref 39–117)
ALT: 10 U/L (ref 0–35)
AST: 29 U/L (ref 0–37)
Albumin: 1.9 g/dL — ABNORMAL LOW (ref 3.5–5.2)
BILIRUBIN TOTAL: 0.7 mg/dL (ref 0.3–1.2)
BUN: 31 mg/dL — ABNORMAL HIGH (ref 6–23)
CHLORIDE: 96 meq/L (ref 96–112)
CO2: 20 meq/L (ref 19–32)
Calcium: 9 mg/dL (ref 8.4–10.5)
Creatinine, Ser: 1.37 mg/dL — ABNORMAL HIGH (ref 0.50–1.10)
GFR calc Af Amer: 45 mL/min — ABNORMAL LOW (ref >90)
GFR calc non Af Amer: 39 mL/min — ABNORMAL LOW (ref >90)
Glucose, Bld: 228 mg/dL — ABNORMAL HIGH (ref 70–99)
Potassium: 5.1 mEq/L (ref 3.7–5.3)
Sodium: 134 mEq/L — ABNORMAL LOW (ref 137–147)
Total Protein: 10.9 g/dL — ABNORMAL HIGH (ref 6.0–8.3)

## 2014-04-15 LAB — GLUCOSE, CAPILLARY
Glucose-Capillary: 168 mg/dL — ABNORMAL HIGH (ref 70–99)
Glucose-Capillary: 300 mg/dL — ABNORMAL HIGH (ref 70–99)

## 2014-04-15 LAB — TYPE AND SCREEN
ABO/RH(D): B POS
Antibody Screen: NEGATIVE
UNIT DIVISION: 0
Unit division: 0

## 2014-04-15 MED ORDER — ASPIRIN 81 MG PO TBEC: 81.0000 mg | DELAYED_RELEASE_TABLET | Freq: Every day | ORAL | Status: DC

## 2014-04-15 NOTE — Discharge Summary (Signed)
[facility] Discharge Summary  Patient name: [name] Medical record number: [id number] Date of birth: May 31, 1946 Age: 68 y.o. Gender: female Date of Admission: 04/13/2014  Date of Discharge: 04/15/14 Admitting Physician: Shanda Howells, MD  Primary Care Provider: Jenny Reichmann, MD Consultants: Dr. Jana Hakim (consulted via phone)  Indication for Hospitalization: epistaxis   Discharge Diagnoses/Problem List:  -epistaxis -MM -HTN -tachycardia -hx of PE -back pain -DM -Constipation  Disposition: to home  Discharge Condition: stabilized   Discharge Exam:  BP 151/79  Pulse 96  Temp(Src) 98.3 F (36.8 C) (Oral)  Resp 20  Ht _0  (1.575 m)  Wt 173 lb 1.6 oz (78.518 kg)  BMI 31.65 kg/m2  SpO2 93% General: NAD, resting comfortably in bed  HEENT: NCAT, right nare with evidence of dried blood, [place] in place, PERRL, MMM  Cardiovascular: regular rate rhythm, no murmurs noted  Respiratory: CTAB, no wheezes or crackles  Abdomen: s, without significant tenderness or distension, hypoactive Extremities: no edema  Skin: no lesions noted  Neuro: alert, no focal findings   Brief Hospital Course:  [name] is a 68 y.o F with hx of HTN, DM, STEMI, PE on coumadin, and recent dx of MM (followed by hemeonc) who presented with uncontrolled epistaxis.   # Epistaxis: Patient with recurrent epistaxis despite several packings and cautery attempts. Now with no bleeding s/p cautery in the ED by ENT. This is in setting of increase in aspirin dosage, following d/c of coumadin due to hemoptysis. Hemeonc recommended holding ASA if continued to bleed. Pt dropped to 72m ASA during admission, no further bleeds, cont'd on home augmentin. INR 1.17. Will f/up as an outpt closely for anticoag.  # Multiple Myeloma: Recent diagnosis now followed by Dr MJana Hakim Note patient to [facility] Thus patient received  2U prbcs. With response in hgb from 8->8.8. Scheduled for outpt visit on 5/12. Dr MJana Hakiminformed of admission. Pt to continue on dexamethasone 278m will likely start on acyclovir and septra as outpt for immunocompromise. Likely to start treatment soon.  # HTN: Controlled on admission. Continued on metoprolol.    # Tachycardia: Tachycardic to 120's on admission. No EKG in the ED. Likely related to anemia in combination with anxiety from being in the ED and having recurrent nose bleeds. Improved s/p transfusion. Pt cont on metoprolol 25BID.   # Hx of PE: Approx >6 months ago had PE associated with surgery. Had recent hospitalization for hemoptysis relating to supratherapeutic INR and was transitioned off coumadin. Then started on aspirin 325 mg for this. Given recurrent epistaxis decreased to ASA 81. Will cont as outpt until pt f/up with hemeonc.   # Back pain: Likely stemming from recent vertebral compression fracture. Continued on robaxin and tramadol. Pain well controlled   # DM: last A1c was 7.1 10/08/13. Held home amaryl, elevated CBGs 300s on admission. Would advise PCP titrating further.    # Constipation: Continued home senokot and miralax  Issues for Follow Up:  1. Hyperglycemia- CBGs in 300s on holding amryl, rec close outpt f/up 2. Need for anticoagulation with patient having several active bleeds  3. Need to start epo as an outpt 4. Pt immunocompromised, need to start acyclovir and septra for prophylaxis as outpt  Significant Procedures: electrocautery   Significant Labs and Imaging:   Recent Labs Lab 04/13/14 2213 04/14/14 0630 04/14/14 2350  WBC 11.3* 7.4 11.3*  HGB 8.0* 7.2* 8.8*  HCT 25.2* 22.9* 27.1*  PLT 154 149* 129*    Recent Labs Lab 04/09/14 1241 04/13/14 2213 04/14/14 0630 04/14/14 2350  NA 137 136* 136* 134*  K 4.9 4.4 4.8 5.1  CL 97 93* 96 96  CO2 _0 GLUCOSE 152* 123* 118* 228*  BUN 31* 32* 32* 31*  CREATININE 1.53* 1.45* 1.57* 1.37*   CALCIUM 9.6 9.7 9.3 9.0  ALKPHOS 61  --  63 72  AST 24  --  22 29  ALT 7  --  6 10  ALBUMIN 2.1*  --  1.9* 1.9*     Results/Tests Pending at Time of Discharge: none  Discharge Medications:    Medication List    STOP taking these medications       aspirin 325 MG tablet  Replaced by:  aspirin 81 MG EC tablet     fluconazole 100 MG tablet  Commonly known as:  DIFLUCAN      TAKE these medications       acetaminophen 325 MG tablet  Commonly known as:  TYLENOL  Take 1-2 tablets (325-650 mg total) by mouth every 4 (four) hours as needed for mild pain.     amoxicillin-clavulanate 875-125 MG per tablet  Commonly known as:  AUGMENTIN  Take 1 tablet by mouth 2 (two) times daily. 7 day therapy course patient began on 04/09/14     aspirin 81 MG EC tablet  Take 1 tablet (81 mg total) by mouth daily.     dexamethasone 4 MG tablet  Commonly known as:  DECADRON  Take 5 tablets (20 mg total) by mouth as directed.     glimepiride 2 MG tablet  Commonly known as:  AMARYL  Take 2 mg by mouth daily with breakfast.     methocarbamol 500 MG tablet  Commonly known as:  ROBAXIN  Take 500 mg by mouth every 6 (six) hours as needed for muscle spasms.     metoprolol tartrate 25 MG tablet  Commonly known as:  LOPRESSOR  Take 25 mg by mouth 2 (two) times daily.     multivitamin with minerals Tabs tablet  Take 1 tablet by mouth at bedtime. Centrum Silver     nitroGLYCERIN 0.4 MG SL tablet  Commonly known as:  NITROSTAT  Place 1 tablet (0.4 mg total) under the tongue every 5 (five) minutes as needed for chest pain (up to 3 doses).     nystatin 100000 UNIT/GM Powd  Apply 1 g topically 3 (three) times daily.     OCUVITE PO  Take 1 tablet by mouth daily.     polyethylene glycol packet  Commonly known as:  MIRALAX / GLYCOLAX  Take 17 g by mouth daily.     sennosides-docusate sodium 8.6-50 MG tablet  Commonly known as:  SENOKOT-S  Take 2 tablets by mouth at bedtime.     traMADol 50  MG tablet  Commonly known as:  ULTRAM  Take 50 mg by mouth every 6 (six) hours as needed for moderate pain.        Discharge Instructions: Please refer to Patient Instructions section of EMR for full details.  Patient was counseled important signs and symptoms that should prompt return to medical care, changes in medications, dietary instructions, activity restrictions, and follow up appointments.   Follow-Up Appointments: Follow-up Information   Follow up with Chauncey Cruel, MD On 04/28/2014. (_1 )    Specialty:  Oncology   Contact information:   [place]  [place] [address] [phone]       Schedule an appointment as soon as possible for a visit with DAUB, Lina Sayre, MD. (for hospital follow up)    Specialty:  Family Medicine   Contact information:   [place] [address] [phone]       Langston Masker, MD 04/15/2014, 1:53 PM PGY-1, [place]

## 2014-04-15 NOTE — Discharge Summary (Signed)
FMTS ATTENDING  NOTE Sue Eniola,MD I  have seen and examined this patient, reviewed their chart. I have discussed this patient with the resident. I agree with the resident's findings, assessment and care plan. 

## 2014-04-15 NOTE — Discharge Instructions (Signed)
[name], you were seen in the hospital for nose bleeding. We were pleased to see that it was able to be stopped after electrocautery from ENT. We switched you over to aspirin 81 mg (from 325) please continue to take the lower dose of this medication until you see Dr. Jana Hakim in May. We also gave you a transfusion to increase your blood levels. You will likely need a medication as an outpatient to help keep these levels steady. We also gave you a medicine to bring your calcium levels down and were reassured that your numbers already looked better before leaving the hospital. Should you experience more nose bleeds, coughing up blood, nausea, dizziness or passing out please call 911 right away as these could be signs of a medical emergency

## 2014-04-15 NOTE — Progress Notes (Signed)
[name]   DOB:August 10, 1946   FK#:[id number]   FVC#:[id number]  Subjective: "I'm doing better;" tolerated zolendronate and PRBCs yesterday w/o event; no further epistaxis since cautery 04/13/2014; received dexamethasone 20 mg po yesterday and will receive same today; husband in room  Objective: middle aged white woman examined in chair Filed Vitals:   04/15/14 0541  BP: 160/88  Pulse: 99  Temp: 98.3 F (36.8 C)  Resp: 18    Body mass index is 31.65 kg/(m^2).  Intake/Output Summary (Last 24 hours) at 04/15/14 0814 Last data filed at 04/14/14 1702  Gross per 24 hour  Intake 841.67 ml  Output      0 ml  Net 841.67 ml     Sclerae unicteric  R nares slight dried blood, no active bleeding  No cervical or supraclavicular adenopathy  Lungs clear -- auscultated anterolaterally  Heart regular rate and rhythm  Abdomen benign  Neuro nonfocal, well oriented, positive affect  Breast exam: deferred  CBG (last 3)   Recent Labs  04/14/14 1233 04/14/14 1700 04/14/14 2104  GLUCAP 330* 356* 336*     Labs:  Lab Results  Component Value Date   WBC 11.3* 04/14/2014   HGB 8.8* 04/14/2014   HCT 27.1* 04/14/2014   MCV 85.8 04/14/2014   PLT 129* 04/14/2014   NEUTROABS 8.6* 04/14/2014    '@LASTCHEMISTRY'$ @  Urine Studies No results found for this basename: UACOL, UAPR, USPG, UPH, UTP, UGL, UKET, UBIL, UHGB, UNIT, UROB, ULEU, UEPI, UWBC, URBC, UBAC, CAST, CRYS, UCOM, BILUA,  in the last 72 hours  Basic Metabolic Panel:  Recent Labs Lab 04/08/14 1136  04/09/14 1241 04/13/14 2213 04/14/14 0630 04/14/14 2350  NA 137  --  137 136* 136* 134*  K 5.3*  < > 4.9 4.4 4.8 5.1  CL  --   --  97 93* 96 96  CO2 21*  --  $R'22 21 23 20  'mG$ GLUCOSE 166*  --  152* 123* 118* 228*  BUN 28.4*  --  31* 32* 32* 31*  CREATININE 1.6*  --  1.53* 1.45* 1.57* 1.37*  CALCIUM 9.8  --  9.6 9.7 9.3 9.0  < > = values in this interval not displayed. GFR Estimated Creatinine Clearance: 39.2 ml/min (by C-G formula  based on Cr of 1.37). Liver Function Tests:  Recent Labs Lab 04/08/14 1136 04/09/14 1241 04/14/14 0630 04/14/14 2350  AST 41* $Remov'24 22 29  'rKSdWL$ ALT $Remo'8 7 6 10  'ASTAT$ ALKPHOS 65 61 63 72  BILITOT 0.31 0.4 0.3 0.7  PROT 11.7* 11.1* 10.5* 10.9*  ALBUMIN 2.2* 2.1* 1.9* 1.9*   No results found for this basename: LIPASE, AMYLASE,  in the last 168 hours No results found for this basename: AMMONIA,  in the last 168 hours Coagulation profile  Recent Labs Lab 04/09/14 1241 04/14/14 1015  INR 1.31 1.17    CBC:  Recent Labs Lab 04/09/14 1241 04/10/14 1426 04/12/14 1635 04/13/14 2213 04/14/14 0630 04/14/14 2350  WBC 9.7 9.0 9.0 11.3* 7.4 11.3*  NEUTROABS 6.6 6.0  --   --  4.3 8.6*  HGB 9.0* 8.1* 8.3* 8.0* 7.2* 8.8*  HCT 27.8* 26.6* 26.2* 25.2* 22.9* 27.1*  MCV 84.8 85.8 85.6 86.9 86.7 85.8  PLT 175 165 162 154 149* 129*   Cardiac Enzymes: No results found for this basename: CKTOTAL, CKMB, CKMBINDEX, TROPONINI,  in the last 168 hours BNP: No components found with this basename: POCBNP,  CBG:  Recent Labs Lab 04/14/14 0142 04/14/14 0748 04/14/14 1233  04/14/14 1700 04/14/14 2104  GLUCAP 126* 107* 330* 356* 336*   D-Dimer No results found for this basename: DDIMER,  in the last 72 hours Hgb A1c  Recent Labs  04/13/14 2213  HGBA1C 6.2*   Lipid Profile No results found for this basename: CHOL, HDL, LDLCALC, TRIG, CHOLHDL, LDLDIRECT,  in the last 72 hours Thyroid function studies No results found for this basename: TSH, T4TOTAL, FREET3, T3FREE, THYROIDAB,  in the last 72 hours Anemia work up No results found for this basename: VITAMINB12, FOLATE, FERRITIN, TIBC, IRON, RETICCTPCT,  in the last 72 hours Microbiology No results found for this or any previous visit (from the past 240 hour(s)).    Studies:  Dg Bone Survey Met  04/13/2014   CLINICAL DATA:  Multiple myeloma  EXAM: METASTATIC BONE SURVEY  COMPARISON:  Nuclear medicine bone scan March 25, 2014  FINDINGS: Lateral  skull: There are multiple subcentimeter lytic lesions throughout the skull consistent with multiple myeloma. Some appears normal.  AP and lateral cervical spine: Bones appear diffusely osteoporotic. There are multiple subtle lytic lesions throughout the cervical spine. No impending fracture. No fracture or spondylolisthesis.  AP and lateral thoracic spine: There are small lytic lesions in the T3, T4, T5, T6, T7, T8, T9, T10, T11, and T12 vertebral bodies, also subcentimeter. Bones are diffusely osteoporotic. There is slight anterior wedging of the T7 vertebral body. There is moderate generalized osteoarthritic change.  AP chest: There is mild atelectatic change in both lower lobes. There is no edema or consolidation. Heart size and pulmonary vascularity are normal. No adenopathy. Bones are somewhat osteoporotic. There is a small lytic lesion in the lateral left seventh rib. There is a small lytic lesion in the anterior right second rib.  AP and lateral lumbar spine: There is significant collapse of the L1 vertebral body. There are equivocal tiny lytic lesions in the L3 vertebral body. There is subtle mottling in the L5 vertebral body concerning for neoplastic involvement.  Pelvis: There are lytic lesions in the right superior pubic ramus. There are lytic lesions in the intertrochanteric through femur regions bilaterally. There is moderate osteoarthritic change in both hip joints. There is a subtle lytic lesion in the lateral and mid right ischium. There is no fracture or impending fracture.  Right femur/tibia and fibula: Small lytic lesions are identified in the femoral neck region. There is a subtle small lytic lesion in the mid femur region. No fracture or impending fracture.  Left femur/tibia and fibula: There is a total knee replacement. Small lytic lesions are identified in the intertrochanteric region. No other lytic lesions appreciated. Periosteum intact.  Right shoulder, humerus, and forearm: There are  subtle lytic lesions throughout the right scapula and proximal right humerus. No fracture or impending fracture.  Left shoulder, humerus, and forearm: There are multiple small lytic lesions in the scapula and proximal humerus. No fracture or impending fracture.  IMPRESSION: Multiple lytic lesions throughout the skeleton consistent with multiple myeloma. No impending fracture apparent. Note that there is that collapse of the L1 vertebral body. Bones are somewhat diffusely osteoporotic. The L5 vertebral body has a somewhat mottled appearance suggesting somewhat more extensive neoplastic involvement at this level compared to other levels.   Electronically Signed   By: Bretta Bang M.D.   On: 04/13/2014 15:02    Assessment: 68 y.o. [place] woman presenting with hypercalcemia, anemia and mottled bone lesions April 2015, M-spike 3.82 g, IFE showing IgA/kappa myeloma with bone marrow biopsy 03/30/2014 with 88%  plasma cells, FISH t(4,14), deletion 13 and deletion 11 (intermediate risk); initial beta-2 microglobulin 16.   (1) consented to E1A11 study, randomization pending   (2) associated issues:   (a) coagulopathy: history of post-op PE; history of bleeding with warfarin; starting ASA 325 ms/ day 04/10/2014, developed persistent nosebleeds, s/p electrocautery R anterior septal bleeding site 04/13/2014 Izora Gala) (b) anemia, requiring transfusion: start erythropoietin as outpatient (c) hypercalcemia: started zolendronate 04/14/2014 (d) pain: compression fracture L1, rib fractures: on tylenol and tramadol  (e) immunocompromise: to start acyclovir and Septra prophylaxis as outpatient     Plan: she will continue the dexamethasone 20 mg po every tuesday and weds as outpatient, and will see me 04/28/2014. By then she should have been randomized to bortezomib vs carfilzomib and we will be able to start her treatment.  Appreciate your excellent care to this patient!   Chauncey Cruel,  MD 04/15/2014  8:14 AM

## 2014-04-15 NOTE — Progress Notes (Signed)
Patient was discharged home by MD order; discharged instructions  review and give to patient with care notes; IV DIC; skin intact; patient will be escorted to the car by volunteer via wheelchair.  

## 2014-04-16 ENCOUNTER — Telehealth: Payer: Self-pay | Admitting: Family Medicine

## 2014-04-16 ENCOUNTER — Ambulatory Visit (INDEPENDENT_AMBULATORY_CARE_PROVIDER_SITE_OTHER): Payer: Medicare Other | Admitting: Emergency Medicine

## 2014-04-16 VITALS — BP 170/106 | HR 76 | Temp 97.3°F | Resp 20 | Ht 62.0 in | Wt 178.0 lb

## 2014-04-16 DIAGNOSIS — IMO0002 Reserved for concepts with insufficient information to code with codable children: Secondary | ICD-10-CM

## 2014-04-16 DIAGNOSIS — E119 Type 2 diabetes mellitus without complications: Secondary | ICD-10-CM

## 2014-04-16 DIAGNOSIS — R040 Epistaxis: Secondary | ICD-10-CM

## 2014-04-16 DIAGNOSIS — C9000 Multiple myeloma not having achieved remission: Secondary | ICD-10-CM

## 2014-04-16 DIAGNOSIS — T148XXA Other injury of unspecified body region, initial encounter: Secondary | ICD-10-CM

## 2014-04-16 LAB — UPEP/TP, 24-HR URINE
ALPHA-1-GLOBULIN, U: 26.7 %
Albumin: 32.3 %
Alpha-2-Globulin, U: 9.6 %
Beta Globulin, U: 25.8 %
COLLECTION INTERVAL: 24 h
Gamma Globulin, U: 5.6 %
TOTAL PROTEIN, URINE: 38 mg/dL
Total Protein, Urine/Day: 950 mg/d — ABNORMAL HIGH (ref 50–100)
Total Volume, Urine: 2500 mL

## 2014-04-16 LAB — UIFE/LIGHT CHAINS/TP QN, 24-HR UR
Albumin, U: DETECTED
Alpha 1, Urine: DETECTED — AB
Alpha 2, Urine: DETECTED — AB
Beta, Urine: DETECTED — AB
FREE KAPPA LT CHAINS, UR: 38.3 mg/dL — AB (ref 0.14–2.42)
FREE LAMBDA EXCRETION/DAY: 6 mg/d
FREE LAMBDA LT CHAINS, UR: 0.24 mg/dL (ref 0.02–0.67)
FREE LT CHN EXCR RATE: 957.5 mg/d
Free Kappa/Lambda Ratio: 159.58 ratio — ABNORMAL HIGH (ref 2.04–10.37)
GAMMA UR: DETECTED — AB
TIME-UPE24: 24 h
Total Protein, Urine-Ur/day: 1293 mg/d — ABNORMAL HIGH (ref 10–140)
Total Protein, Urine: 51.7 mg/dL
VOLUME, URINE-UPE24: 2500 mL

## 2014-04-16 MED ORDER — GLUCOSE BLOOD VI STRP: ORAL_STRIP | Status: DC

## 2014-04-16 MED ORDER — TRAMADOL HCL 50 MG PO TABS: 50.0000 mg | ORAL_TABLET | Freq: Four times a day (QID) | ORAL | Status: DC | PRN

## 2014-04-16 MED ORDER — LANCETS MISC: Status: AC

## 2014-04-16 NOTE — Telephone Encounter (Signed)
Left message to return call. Dr. Everlene Farrier spoke with Dr. Jana Hakim and he is aware of situation. Chemo plan is less aggressive. Can not do original since she can not tolerate high dose ASA.

## 2014-04-16 NOTE — Patient Instructions (Signed)
Please take an extra dose of your diabetes medicines on the day you take the dexamethasone. Followup with Dr. Constance Holster tomorrow.

## 2014-04-16 NOTE — Progress Notes (Addendum)
This chart was scribed for Remo Lipps A. Allexa Acoff by Allena Earing, ED Scribe. This patient was seen in room 13 and the patient's care was started at 9:10 AM .  Subjective:    Patient ID: [name], female    DOB: June 02, 1946, 68 y.o.   MRN: [id number]  HPI  HPI Comments: [name] is a 68 y.o. female with h/o multiple myeloma who presents to the [facility] complaining of recurrent epistaxis. Her husband believes that her nosebleeds could have been caused by the pt receiving oxygen at the hospital. Pt was released from the hospital yesterday and just began chemotherapy.  She reports that her chemotherapy is causing her sugar levels to rise to the 300's, her sugars go up and down depending on what medication she has to take.She reports that her sugar levels generally increase on Tuesdays and Wednesday, she reports normally running around 170. She has been taken off metFORMIN.  She reports that her back pain is now tolerable, she rates it a 2/10. She has a vertebral fracture that is closed.  Blood glucose level was in the 170's this morning.   Review of Systems     Objective:   Physical Exam  Nursing note and vitals reviewed.  CONSTITUTIONAL: Pale and chronically ill. HEAD: Normocephalic/atraumatic EYES: EOMI/PERRL ENMT: Clot and silver staining around her right nares. NECK: supple no meningeal signs SPINE:entire spine nontender CV: S1/S2 noted, no murmurs/rubs/gallops noted LUNGS: Lungs are clear to auscultation bilaterally, no apparent distress ABDOMEN: soft, nontender, no rebound or guarding GU:no cva tenderness NEURO: Pt is awake/alert, moves all extremitiesx4 EXTREMITIES: pulses normal, full ROM. No Edema. SKIN: warm, color normal PSYCH: no abnormalities of mood noted     Filed Vitals:   04/16/14 0857  BP: 170/106  Pulse: 76  Temp: 97.3 F (36.3 C)  Resp: 20   Results for orders placed during the hospital encounter of 04/13/14  CBC        Result Value Ref Range   WBC 11.3 (*) 4.0 - 10.5 K/uL   RBC 2.90 (*) 3.87 - 5.11 MIL/uL   Hemoglobin 8.0 (*) 12.0 - 15.0 g/dL   HCT 25.2 (*) 36.0 - 46.0 %   MCV 86.9  78.0 - 100.0 fL   MCH 27.6  26.0 - 34.0 pg   MCHC 31.7  30.0 - 36.0 g/dL   RDW 23.2 (*) 11.5 - 15.5 %   Platelets 154  150 - 400 K/uL  BASIC METABOLIC PANEL      Result Value Ref Range   Sodium 136 (*) 137 - 147 mEq/L   Potassium 4.4  3.7 - 5.3 mEq/L   Chloride 93 (*) 96 - 112 mEq/L   CO2 21  19 - 32 mEq/L   Glucose, Bld 123 (*) 70 - 99 mg/dL   BUN 32 (*) 6 - 23 mg/dL   Creatinine, Ser 1.45 (*) 0.50 - 1.10 mg/dL   Calcium 9.7  8.4 - 10.5 mg/dL   GFR calc non Af Amer 37 (*) >90 mL/min   GFR calc Af Amer 42 (*) >90 mL/min  COMPREHENSIVE METABOLIC PANEL      Result Value Ref Range   Sodium 136 (*) 137 - 147 mEq/L   Potassium 4.8  3.7 - 5.3 mEq/L   Chloride 96  96 - 112 mEq/L   CO2 23  19 - 32 mEq/L   Glucose, Bld 118 (*) 70 - 99 mg/dL   BUN 32 (*) 6 - 23  mg/dL   Creatinine, Ser 1.57 (*) 0.50 - 1.10 mg/dL   Calcium 9.3  8.4 - 10.5 mg/dL   Total Protein 10.5 (*) 6.0 - 8.3 g/dL   Albumin 1.9 (*) 3.5 - 5.2 g/dL   AST 22  0 - 37 U/L   ALT 6  0 - 35 U/L   Alkaline Phosphatase 63  39 - 117 U/L   Total Bilirubin 0.3  0.3 - 1.2 mg/dL   GFR calc non Af Amer 33 (*) >90 mL/min   GFR calc Af Amer 39 (*) >90 mL/min  CBC WITH DIFFERENTIAL      Result Value Ref Range   WBC 7.4  4.0 - 10.5 K/uL   RBC 2.64 (*) 3.87 - 5.11 MIL/uL   Hemoglobin 7.2 (*) 12.0 - 15.0 g/dL   HCT 22.9 (*) 36.0 - 46.0 %   MCV 86.7  78.0 - 100.0 fL   MCH 27.3  26.0 - 34.0 pg   MCHC 31.4  30.0 - 36.0 g/dL   RDW 23.5 (*) 11.5 - 15.5 %   Platelets 149 (*) 150 - 400 K/uL   Neutrophils Relative % 58  43 - 77 %   Lymphocytes Relative 27  12 - 46 %   Monocytes Relative 11  3 - 12 %   Eosinophils Relative 3  0 - 5 %   Basophils Relative 1  0 - 1 %   Neutro Abs 4.3  1.7 - 7.7 K/uL   Lymphs Abs 2.0  0.7 - 4.0 K/uL   Monocytes Absolute 0.8  0.1 -  1.0 K/uL   Eosinophils Absolute 0.2  0.0 - 0.7 K/uL   Basophils Absolute 0.1  0.0 - 0.1 K/uL   RBC Morphology POLYCHROMASIA PRESENT     WBC Morphology ATYPICAL LYMPHOCYTES    HEMOGLOBIN A1C      Result Value Ref Range   Hemoglobin A1C 6.2 (*) <5.7 %   Mean Plasma Glucose 131 (*) <117 mg/dL  GLUCOSE, CAPILLARY      Result Value Ref Range   Glucose-Capillary 126 (*) 70 - 99 mg/dL   Comment 1 Notify RN     Comment 2 Documented in Chart    PROTIME-INR      Result Value Ref Range   Prothrombin Time 14.7  11.6 - 15.2 seconds   INR 1.17  0.00 - 1.49  GLUCOSE, CAPILLARY      Result Value Ref Range   Glucose-Capillary 107 (*) 70 - 99 mg/dL  GLUCOSE, CAPILLARY      Result Value Ref Range   Glucose-Capillary 330 (*) 70 - 99 mg/dL  COMPREHENSIVE METABOLIC PANEL      Result Value Ref Range   Sodium 134 (*) 137 - 147 mEq/L   Potassium 5.1  3.7 - 5.3 mEq/L   Chloride 96  96 - 112 mEq/L   CO2 20  19 - 32 mEq/L   Glucose, Bld 228 (*) 70 - 99 mg/dL   BUN 31 (*) 6 - 23 mg/dL   Creatinine, Ser 1.37 (*) 0.50 - 1.10 mg/dL   Calcium 9.0  8.4 - 10.5 mg/dL   Total Protein 10.9 (*) 6.0 - 8.3 g/dL   Albumin 1.9 (*) 3.5 - 5.2 g/dL   AST 29  0 - 37 U/L   ALT 10  0 - 35 U/L   Alkaline Phosphatase 72  39 - 117 U/L   Total Bilirubin 0.7  0.3 - 1.2 mg/dL   GFR calc non Af Amer 39 (*) >  90 mL/min   GFR calc Af Amer 45 (*) >90 mL/min  CBC WITH DIFFERENTIAL      Result Value Ref Range   WBC 11.3 (*) 4.0 - 10.5 K/uL   RBC 3.16 (*) 3.87 - 5.11 MIL/uL   Hemoglobin 8.8 (*) 12.0 - 15.0 g/dL   HCT 27.1 (*) 36.0 - 46.0 %   MCV 85.8  78.0 - 100.0 fL   MCH 27.8  26.0 - 34.0 pg   MCHC 32.5  30.0 - 36.0 g/dL   RDW 20.7 (*) 11.5 - 15.5 %   Platelets 129 (*) 150 - 400 K/uL   Neutrophils Relative % 76  43 - 77 %   Neutro Abs 8.6 (*) 1.7 - 7.7 K/uL   Lymphocytes Relative 16  12 - 46 %   Lymphs Abs 1.8  0.7 - 4.0 K/uL   Monocytes Relative 7  3 - 12 %   Monocytes Absolute 0.8  0.1 - 1.0 K/uL   Eosinophils  Relative 1  0 - 5 %   Eosinophils Absolute 0.1  0.0 - 0.7 K/uL   Basophils Relative 0  0 - 1 %   Basophils Absolute 0.1  0.0 - 0.1 K/uL  GLUCOSE, CAPILLARY      Result Value Ref Range   Glucose-Capillary 356 (*) 70 - 99 mg/dL  GLUCOSE, CAPILLARY      Result Value Ref Range   Glucose-Capillary 336 (*) 70 - 99 mg/dL   Comment 1 Documented in Chart     Comment 2 Notify RN    GLUCOSE, CAPILLARY      Result Value Ref Range   Glucose-Capillary 168 (*) 70 - 99 mg/dL  GLUCOSE, CAPILLARY      Result Value Ref Range   Glucose-Capillary 300 (*) 70 - 99 mg/dL  CBG MONITORING, ED      Result Value Ref Range   Glucose-Capillary 120 (*) 70 - 99 mg/dL  PREPARE RBC (CROSSMATCH)      Result Value Ref Range   Order Confirmation ORDER PROCESSED BY BLOOD BANK    TYPE AND SCREEN      Result Value Ref Range   ABO/RH(D) B POS     Antibody Screen NEG     Sample Expiration 04/17/2014     Unit Number [id number]     Blood Component Type RBC, LR IRR     Unit division 00     Status of Unit ISSUED,FINAL     Transfusion Status OK TO TRANSFUSE     Crossmatch Result Compatible     Unit Number [id number]     Blood Component Type RBC, LR IRR     Unit division 00     Status of Unit ISSUED,FINAL     Transfusion Status OK TO TRANSFUSE     Crossmatch Result Compatible           Assessment & Plan:   1  DM on the day she takes her dexamethasone will double up on her oral hypoglycemics. 2. Epistaxis no active bleeding at present. She can use a little Vaseline around the nares but otherwise cannot touch the area. She will. Be on a baby aspirin one a day for pulmonary embolus prophylaxis. She cannot tolerate other anticoagulation the 3. Multiple Myeloma she has started her treatments with Dr. Jana Hakim. 4. Compression fx her pain level is currently 2/10. She will continue ultram for this 5.PE SHE will be on a baby aspirin one a day as prophylaxis for this. This is  going to affect her treatment for her  myeloma because her treatment increases her risk of clot. I discuss the situation with Dr. Jana Hakim 6. Hypertension. Her blood pressure was up today. I will have to check tomorrow at the ENT office and have her call me if it is still elevated. her blood pressures over the last few weeks have been okay. I did talk to her by phone and  asked her to take an extra dose of her blood pressure medication this afternoon I personally performed the services described in this documentation, which was scribed in my presence. The recorded information has been reviewed and is accurate.

## 2014-04-17 ENCOUNTER — Inpatient Hospital Stay (HOSPITAL_COMMUNITY): Payer: Medicare Other

## 2014-04-17 ENCOUNTER — Ambulatory Visit (HOSPITAL_COMMUNITY)
Admission: RE | Admit: 2014-04-17 | Discharge: 2014-04-17 | Disposition: A | Discharge status: Home / Self Care | Payer: Medicare Other | Source: Ambulatory Visit | Attending: Oncology | Admitting: Oncology

## 2014-04-17 DIAGNOSIS — I517 Cardiomegaly: Secondary | ICD-10-CM

## 2014-04-17 DIAGNOSIS — C9000 Multiple myeloma not having achieved remission: Secondary | ICD-10-CM

## 2014-04-20 ENCOUNTER — Telehealth: Payer: Self-pay | Admitting: *Deleted

## 2014-04-20 NOTE — Telephone Encounter (Signed)
Received vm call from pt's daughter-in-law, Judeen Hammans asking about ASA dose.  Returned call & she reports that Dr Jana Hakim increased pt's asa to 325 mg from 81 mg on 04/10/14 & pt was later admitted to the hosp with nose bleed & MD decreased her back to 81mg  & she wants to make sure what she should be on.  Will defer to Dr Jana Hakim.  Call back # is [phone].

## 2014-04-21 ENCOUNTER — Other Ambulatory Visit: Payer: Self-pay | Admitting: Family Medicine

## 2014-04-21 ENCOUNTER — Telehealth: Payer: Self-pay | Admitting: *Deleted

## 2014-04-21 DIAGNOSIS — R2681 Unsteadiness on feet: Secondary | ICD-10-CM

## 2014-04-22 NOTE — Telephone Encounter (Signed)
Spoke to patient advised patient that Dr. Everlene Farrier has spoke to Dr. Gwenlyn Perking and he is aware of her situation. Told her chemo is less aggressive and she can not do the original plan since she can not tolerate high doses of ASA. She wrote down the doctor's name and stated she is feeling some better.  Asked when Dr. Everlene Farrier would be back and I told her on May 23rd. She verbalized understanding.

## 2014-04-23 ENCOUNTER — Telehealth: Payer: Self-pay

## 2014-04-23 NOTE — Telephone Encounter (Signed)
Completed [facility]' form for Medicare coverage of DM supplies and faxed back. Scanned.

## 2014-04-26 ENCOUNTER — Other Ambulatory Visit: Payer: Self-pay | Admitting: Oncology

## 2014-04-26 DIAGNOSIS — C9001 Multiple myeloma in remission: Secondary | ICD-10-CM

## 2014-04-27 ENCOUNTER — Other Ambulatory Visit: Payer: Self-pay | Admitting: Oncology

## 2014-04-27 ENCOUNTER — Telehealth: Payer: Self-pay

## 2014-04-27 DIAGNOSIS — C9001 Multiple myeloma in remission: Secondary | ICD-10-CM

## 2014-04-27 MED ORDER — GLUCOSE BLOOD VI STRP: 1.0000 | ORAL_STRIP | Freq: Three times a day (TID) | Status: DC

## 2014-04-27 NOTE — Telephone Encounter (Signed)
Please send new rx for this pt's test strips

## 2014-04-27 NOTE — Telephone Encounter (Signed)
Patient states she has Medicare and [place].  Ultra touch 2.  States she has been using them but we increased the times a day she tests to 3.  Insurance wants a new RX stating this before they will cover the strips.

## 2014-04-27 NOTE — Telephone Encounter (Signed)
Done

## 2014-04-27 NOTE — Telephone Encounter (Signed)
Patient states the insurance company will not cover her test strips for sugar check.    [phone]

## 2014-04-28 ENCOUNTER — Ambulatory Visit (HOSPITAL_BASED_OUTPATIENT_CLINIC_OR_DEPARTMENT_OTHER): Payer: Medicare Other | Admitting: Oncology

## 2014-04-28 ENCOUNTER — Telehealth: Payer: Self-pay | Admitting: *Deleted

## 2014-04-28 ENCOUNTER — Ambulatory Visit (HOSPITAL_BASED_OUTPATIENT_CLINIC_OR_DEPARTMENT_OTHER): Payer: Medicare Other

## 2014-04-28 ENCOUNTER — Other Ambulatory Visit (HOSPITAL_BASED_OUTPATIENT_CLINIC_OR_DEPARTMENT_OTHER): Payer: Medicare Other

## 2014-04-28 ENCOUNTER — Telehealth: Payer: Self-pay | Admitting: Oncology

## 2014-04-28 VITALS — BP 149/84 | HR 88 | Temp 97.8°F | Resp 18 | Ht 62.0 in | Wt 169.7 lb

## 2014-04-28 DIAGNOSIS — M8448XA Pathological fracture, other site, initial encounter for fracture: Secondary | ICD-10-CM

## 2014-04-28 DIAGNOSIS — C9000 Multiple myeloma not having achieved remission: Secondary | ICD-10-CM

## 2014-04-28 DIAGNOSIS — R059 Cough, unspecified: Secondary | ICD-10-CM

## 2014-04-28 DIAGNOSIS — C9001 Multiple myeloma in remission: Secondary | ICD-10-CM

## 2014-04-28 DIAGNOSIS — Z86711 Personal history of pulmonary embolism: Secondary | ICD-10-CM

## 2014-04-28 DIAGNOSIS — Z5112 Encounter for antineoplastic immunotherapy: Secondary | ICD-10-CM

## 2014-04-28 DIAGNOSIS — F329 Major depressive disorder, single episode, unspecified: Secondary | ICD-10-CM

## 2014-04-28 DIAGNOSIS — D689 Coagulation defect, unspecified: Secondary | ICD-10-CM

## 2014-04-28 DIAGNOSIS — F3289 Other specified depressive episodes: Secondary | ICD-10-CM

## 2014-04-28 DIAGNOSIS — R05 Cough: Secondary | ICD-10-CM

## 2014-04-28 DIAGNOSIS — R040 Epistaxis: Secondary | ICD-10-CM

## 2014-04-28 DIAGNOSIS — R51 Headache: Secondary | ICD-10-CM

## 2014-04-28 LAB — CBC WITH DIFFERENTIAL/PLATELET
BASO%: 0.3 % (ref 0.0–2.0)
Basophils Absolute: 0 10*3/uL (ref 0.0–0.1)
EOS ABS: 0.3 10*3/uL (ref 0.0–0.5)
EOS%: 3.7 % (ref 0.0–7.0)
HEMATOCRIT: 31.5 % — AB (ref 34.8–46.6)
HGB: 10 g/dL — ABNORMAL LOW (ref 11.6–15.9)
LYMPH%: 19.1 % (ref 14.0–49.7)
MCH: 27 pg (ref 25.1–34.0)
MCHC: 31.7 g/dL (ref 31.5–36.0)
MCV: 85.1 fL (ref 79.5–101.0)
MONO#: 0.6 10*3/uL (ref 0.1–0.9)
MONO%: 8.1 % (ref 0.0–14.0)
NEUT%: 68.8 % (ref 38.4–76.8)
NEUTROS ABS: 5.3 10*3/uL (ref 1.5–6.5)
PLATELETS: 118 10*3/uL — AB (ref 145–400)
RBC: 3.7 10*6/uL (ref 3.70–5.45)
RDW: 21.1 % — ABNORMAL HIGH (ref 11.2–14.5)
WBC: 7.8 10*3/uL (ref 3.9–10.3)
lymph#: 1.5 10*3/uL (ref 0.9–3.3)
nRBC: 2 % — ABNORMAL HIGH (ref 0–0)

## 2014-04-28 LAB — COMPREHENSIVE METABOLIC PANEL (CC13)
ALT: 27 U/L (ref 0–55)
ANION GAP: 14 meq/L — AB (ref 3–11)
AST: 36 U/L — ABNORMAL HIGH (ref 5–34)
Albumin: 2.2 g/dL — ABNORMAL LOW (ref 3.5–5.0)
Alkaline Phosphatase: 76 U/L (ref 40–150)
BILIRUBIN TOTAL: 0.54 mg/dL (ref 0.20–1.20)
BUN: 27.9 mg/dL — AB (ref 7.0–26.0)
CALCIUM: 9.1 mg/dL (ref 8.4–10.4)
CHLORIDE: 104 meq/L (ref 98–109)
CO2: 19 meq/L — AB (ref 22–29)
CREATININE: 1.5 mg/dL — AB (ref 0.6–1.1)
GLUCOSE: 201 mg/dL — AB (ref 70–140)
Potassium: 4.7 mEq/L (ref 3.5–5.1)
Sodium: 137 mEq/L (ref 136–145)
Total Protein: 10.9 g/dL — ABNORMAL HIGH (ref 6.4–8.3)

## 2014-04-28 LAB — PROTIME-INR
INR: 1.1 — AB (ref 2.00–3.50)
PROTIME: 13.2 s (ref 10.6–13.4)

## 2014-04-28 LAB — TECHNOLOGIST REVIEW

## 2014-04-28 MED ORDER — PROCHLORPERAZINE MALEATE 10 MG PO TABS: 10.0000 mg | ORAL_TABLET | Freq: Four times a day (QID) | ORAL | Status: DC | PRN

## 2014-04-28 MED ORDER — ONDANSETRON HCL 8 MG PO TABS
ORAL_TABLET | ORAL | Status: AC
  Filled 2014-04-28: qty 1

## 2014-04-28 MED ORDER — BORTEZOMIB CHEMO SQ INJECTION 3.5 MG (2.5MG/ML)
1.3000 mg/m2 | Freq: Once | INTRAMUSCULAR | Status: AC
  Administered 2014-04-28: 2.5 mg via SUBCUTANEOUS
  Filled 2014-04-28: qty 2.5

## 2014-04-28 MED ORDER — ONDANSETRON HCL 8 MG PO TABS
8.0000 mg | ORAL_TABLET | Freq: Once | ORAL | Status: AC
  Administered 2014-04-28: 8 mg via ORAL

## 2014-04-28 NOTE — Patient Instructions (Signed)
[facility] Discharge Instructions for Patients Receiving Chemotherapy  Today you received the following chemotherapy agents valcade.   To help prevent nausea and vomiting after your treatment, we encourage you to take your nausea medication as directed.     If you develop nausea and vomiting that is not controlled by your nausea medication, call the clinic.   BELOW ARE SYMPTOMS THAT SHOULD BE REPORTED IMMEDIATELY:  *FEVER GREATER THAN 100.5 F  *CHILLS WITH OR WITHOUT FEVER  NAUSEA AND VOMITING THAT IS NOT CONTROLLED WITH YOUR NAUSEA MEDICATION  *UNUSUAL SHORTNESS OF BREATH  *UNUSUAL BRUISING OR BLEEDING  TENDERNESS IN MOUTH AND THROAT WITH OR WITHOUT PRESENCE OF ULCERS  *URINARY PROBLEMS  *BOWEL PROBLEMS  UNUSUAL RASH Items with * indicate a potential emergency and should be followed up as soon as possible.  Feel free to call the clinic you have any questions or concerns. The clinic phone number is [phone].   Bortezomib injection What is this medicine? BORTEZOMIB (bor TEZ oh mib) is a chemotherapy drug. It slows the growth of cancer cells. This medicine is used to treat multiple myeloma, lymphoma, and other cancers. This medicine may be used for other purposes; ask your health care provider or pharmacist if you have questions. COMMON BRAND NAME(S): Velcade What should I tell my health care provider before I take this medicine? They need to know if you have any of these conditions: -heart disease -irregular heartbeat -liver disease -low blood counts, like low white blood cells, platelets, or hemoglobin -peripheral neuropathy -taking medicine for blood pressure -an unusual or allergic reaction to bortezomib, mannitol, boron, other medicines, foods, dyes, or preservatives -pregnant or trying to get pregnant -breast-feeding How should I use this medicine? This medicine is for injection into a vein or for injection under the skin. It is given by a  health care professional in a hospital or clinic setting. Talk to your pediatrician regarding the use of this medicine in children. Special care may be needed. Overdosage: If you think you have taken too much of this medicine contact a poison control center or emergency room at once. NOTE: This medicine is only for you. Do not share this medicine with others. What if I miss a dose? It is important not to miss your dose. Call your doctor or health care professional if you are unable to keep an appointment. What may interact with this medicine? -medicines for diabetes -medicines to increase blood counts like filgrastim, pegfilgrastim, sargramostim -zalcitabine Talk to your doctor or health care professional before taking any of these medicines: -acetaminophen -aspirin -ibuprofen -ketoprofen -naproxen This list may not describe all possible interactions. Give your health care provider a list of all the medicines, herbs, non-prescription drugs, or dietary supplements you use. Also tell them if you smoke, drink alcohol, or use illegal drugs. Some items may interact with your medicine. What should I watch for while using this medicine? Visit your doctor for checks on your progress. This drug may make you feel generally unwell. This is not uncommon, as chemotherapy can affect healthy cells as well as cancer cells. Report any side effects. Continue your course of treatment even though you feel ill unless your doctor tells you to stop. You may get drowsy or dizzy. Do not drive, use machinery, or do anything that needs mental alertness until you know how this medicine affects you. Do not stand or sit up quickly, especially if you are an older patient. This reduces the risk of dizzy or  fainting spells. In some cases, you may be given additional medicines to help with side effects. Follow all directions for their use. Call your doctor or health care professional for advice if you get a fever, chills or sore  throat, or other symptoms of a cold or flu. Do not treat yourself. This drug decreases your body's ability to fight infections. Try to avoid being around people who are sick. This medicine may increase your risk to bruise or bleed. Call your doctor or health care professional if you notice any unusual bleeding. Be careful brushing and flossing your teeth or using a toothpick because you may get an infection or bleed more easily. If you have any dental work done, tell your dentist you are receiving this medicine. Avoid taking products that contain aspirin, acetaminophen, ibuprofen, naproxen, or ketoprofen unless instructed by your doctor. These medicines may hide a fever. Do not become pregnant while taking this medicine. Women should inform their doctor if they wish to become pregnant or think they might be pregnant. There is a potential for serious side effects to an unborn child. Talk to your health care professional or pharmacist for more information. Do not breast-feed an infant while taking this medicine. You may have vomiting or diarrhea while taking this medicine. Drink water or other fluids as directed. What side effects may I notice from receiving this medicine? Side effects that you should report to your doctor or health care professional as soon as possible: -allergic reactions like skin rash, itching or hives, swelling of the face, lips, or tongue -breathing problems -changes in hearing -changes in vision -fast, irregular heartbeat -feeling faint or lightheaded, falls -pain, tingling, numbness in the hands or feet -seizures -swelling of the ankles, feet, hands -unusual bleeding or bruising -unusually weak or tired -vomiting Side effects that usually do not require medical attention (report to your doctor or health care professional if they continue or are bothersome): -changes in emotions or moods -constipation -diarrhea -loss of appetite -headache -irritation at site where  injected -nausea This list may not describe all possible side effects. Call your doctor for medical advice about side effects. You may report side effects to FDA at 1-800-FDA-1088. Where should I keep my medicine? This drug is given in a hospital or clinic and will not be stored at home. NOTE: This sheet is a summary. It may not cover all possible information. If you have questions about this medicine, talk to your doctor, pharmacist, or health care provider.  2014, [employer]. (2011-01-11 11:42:36)

## 2014-04-28 NOTE — Telephone Encounter (Signed)
Per staff phone call and POF I have schedueld appts.  JMW  

## 2014-04-28 NOTE — Progress Notes (Signed)
[place]  [phone] [phone]     ID: BREIONA COUVILLON OB: 11-26-1946  MR#: [id number]  JSH#:[id number]  PCP: Jenny Reichmann, MD GYN:   SU:  OTHER MD: Jodi Marble  CHIEF COMPLAINT:  MYELOMA HISTORY: From the original consult note, dated 03/24/2014:  "The patient has a history of PE following arthroscopic surgery,as well as LLE DVT requiring Coumadin therapy since October of 2014. On 4/7 she presented to the ED with a 2 day history of hemoptysis and mucous material. INR was elevated at 6 requiring FFP and Vit K with improvement of coagulopathy (INR 1.4 this morning). Anticoagulation is on hold until hemoptysis resolves.CT angiogram on 4/7 was negative for pulmonary embolus. Bilateral pneumonia was suspected, requiring IV antibiotics.  Interestingly, diffusely heterogeneous appearance to the visualized bones were seen, more notable than on a prior CT in 2010. Bone scan on 4/8 showed old fractures in posterior 8-9 th ribs, and in a lumbar XR a new lumbar spinal fracture was noted, not picked up by the bone scan. Given the mottled appearance of the CT films,workup for multiple myeloma was initiated: She had anemia in the setting of chronic disease, iron deficiency and blood loss, and mild renal insufficiency was seen with a Cr 1.62. Sodium was normal. Her Calcium however was 12.3 on admission, today at greater than 15 receiving Calcitonin 100U nasal spray, Zometa 4 mg and hydration IV. SPEP / UPEP / IFE are pending. "  Her subsequent history is as detailed below.  INTERVAL HISTORY: Wahneta returns today for followup of her multiple myeloma accompanied by her husband, son, and daughter-in-law. She was seen yesterday by ENT for recurrent epistaxis, which woke her up at 5 in the morning. He proceeded to further fulguration in both nostrils. Rianna tells me she has not had any further bleeding problems. She continues on aspirin 81 mg daily.  REVIEW OF  SYSTEMS: Alis feels weak and somewhat depressed. She is not exercising at all. She walks from her bedroom to the kitchen or bathroom only. Her blood pressure has been on the high side according to her husband's records. She hurts in the back of her head in the lower back and under her breast on the right side. She has some sinus problems but she is avoiding blowing her nose. She sleeps on 3 pillows. She has a cough which rarely produces scant blood, likely from the epistaxis. Her appetite is poor and on days when she takes dexamethasone and her sugar goes up she is afraid of eating anything. She bruises easily. She has difficulty walking. She complains of occasional headaches. She admits to depression. A detailed review of systems today was otherwise stable  PAST MEDICAL HISTORY: Past Medical History  Diagnosis Date  . Neurofibromatosis   . Diabetes mellitus   . HTN (hypertension)   . Obesity   . Osteoarthritis, knee   . Hyperlipidemia     statin intolerant. LDL is 83  . UTI (lower urinary tract infection) 05/29/12  . 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 Pulmonary embolism [id number]    PAST SURGICAL HISTORY: Past Surgical History  Procedure Laterality Date  . Cardiac catheterization    . Tonsillectomy and adenoidectomy    . Clavicle surgery    . Abdominal hysterectomy    . Ankle fracture surgery Right   . Total knee arthroplasty Left 09/15/2013    Procedure: TOTAL KNEE ARTHROPLASTY;  Surgeon: Dannielle Huh, MD;  Location: [facility] OR;  Service: Orthopedics;  Laterality: Left;    FAMILY HISTORY Family History  Problem Relation Age of Onset  . Heart disease Mother   . Arthritis Mother   . Stroke Mother   . Heart disease Son     SOCIAL HISTORY:  Married. Lives in [place] with husband of 40 years. 2 sons in good health.      ADVANCED DIRECTIVES:    HEALTH MAINTENANCE: History  Substance Use Topics  . Smoking  status: Never Smoker   . Smokeless tobacco: Never Used  . Alcohol Use: No     Colonoscopy:  PAP:  Bone density:  Lipid panel:  Allergies  Allergen Reactions  . Codeine Nausea And Vomiting    Sees things  . Hydrocodone Nausea And Vomiting    Sees things  . Niaspan [Niacin Er] Nausea Only  . Robaxin [Methocarbamol] Other (See Comments)    Made patient feel funny  . Statins Nausea And Vomiting and Other (See Comments)    Myalgias/leg pain with atorvastatin, rosuvastatin, lovastatin, simvastatin  . Tetracycline Other (See Comments)    Pt doesn't remember reaction  . Zetia [Ezetimibe] Nausea And Vomiting  . Zithromax [Azithromycin] Nausea And Vomiting    Current Outpatient Prescriptions  Medication Sig Dispense Refill  . acetaminophen (TYLENOL) 325 MG tablet Take 1-2 tablets (325-650 mg total) by mouth every 4 (four) hours as needed for mild pain.      Marland Kitchen amoxicillin-clavulanate (AUGMENTIN) 875-125 MG per tablet Take 1 tablet by mouth 2 (two) times daily. 7 day therapy course patient began on 04/09/14      . aspirin EC 81 MG EC tablet Take 1 tablet (81 mg total) by mouth daily.  30 tablet  0  . dexamethasone (DECADRON) 4 MG tablet Take 5 tablets (20 mg total) by mouth as directed.  40 tablet  6  . glimepiride (AMARYL) 2 MG tablet Take 2 mg by mouth daily with breakfast.      . glucose blood (ONE TOUCH ULTRA TEST) test strip 1 each by Other route 3 (three) times daily.  100 each  12  . Lancets MISC Check glucose 3 times daily  100 each  6  . methocarbamol (ROBAXIN) 500 MG tablet Take 500 mg by mouth every 6 (six) hours as needed for muscle spasms.      . metoprolol tartrate (LOPRESSOR) 25 MG tablet Take 25 mg by mouth 2 (two) times daily.      . Multiple Vitamin (MULTIVITAMIN WITH MINERALS) TABS tablet Take 1 tablet by mouth at bedtime. Centrum Silver      . Multiple Vitamins-Minerals (OCUVITE PO) Take 1 tablet by mouth daily.       . nitroGLYCERIN (NITROSTAT) 0.4 MG SL tablet Place 1  tablet (0.4 mg total) under the tongue every 5 (five) minutes as needed for chest pain (up to 3 doses).  25 tablet  3  . nystatin (MYCOSTATIN/NYSTOP) 100000 UNIT/GM POWD Apply 1 g topically 3 (three) times daily.      . polyethylene glycol (MIRALAX / GLYCOLAX) packet Take 17 g by mouth daily.      . prochlorperazine (COMPAZINE) 10 MG tablet Take 1 tablet (10 mg total) by mouth every 6 (six) hours as needed (Nausea or vomiting).  30 tablet  1  . sennosides-docusate sodium (SENOKOT-S) 8.6-50 MG tablet Take 2 tablets by mouth at bedtime.      . traMADol (ULTRAM) 50 MG tablet Take 1 tablet (50 mg  total) by mouth every 6 (six) hours as needed for moderate pain.  30 tablet  2   No current facility-administered medications for this visit.   Facility-Administered Medications Ordered in Other Visits  Medication Dose Route Frequency Provider Last Rate Last Dose  . 0.45 % sodium chloride infusion   Intravenous Continuous Virgie Dad Tanis Hensarling, MD      . sodium chloride 0.9 % injection 3 mL  3 mL Intracatheter PRN Amy Milda Smart, PA-C        OBJECTIVE: Middle-aged white woman  evaluated in a wheelchair  Filed Vitals:   04/28/14 0951  BP: 149/84  Pulse: 88  Temp: 97.8 F (36.6 C)  Resp: 18     Body mass index is 31.03 kg/(m^2).    ECOG FS:2 - Symptomatic, <50% confined to bed  Full exam was not repeated today  LAB RESULTS:  CMP     Component Value Date/Time   NA 137 04/28/2014 0922   NA 134* 04/14/2014 2350   K 4.7 04/28/2014 0922   K 5.1 04/14/2014 2350   CL 96 04/14/2014 2350   CO2 19* 04/28/2014 0922   CO2 20 04/14/2014 2350   GLUCOSE 201* 04/28/2014 0922   GLUCOSE 228* 04/14/2014 2350   BUN 27.9* 04/28/2014 0922   BUN 31* 04/14/2014 2350   CREATININE 1.5* 04/28/2014 0922   CREATININE 1.37* 04/14/2014 2350   CREATININE 0.81 12/09/2013 1000   CALCIUM 9.1 04/28/2014 0922   CALCIUM 9.0 04/14/2014 2350   CALCIUM 11.1* 03/25/2014 0850   PROT 10.9* 04/28/2014 0922   PROT 10.9* 04/14/2014 2350   ALBUMIN 2.2*  04/28/2014 0922   ALBUMIN 1.9* 04/14/2014 2350   AST 36* 04/28/2014 0922   AST 29 04/14/2014 2350   ALT 27 04/28/2014 0922   ALT 10 04/14/2014 2350   ALKPHOS 76 04/28/2014 0922   ALKPHOS 72 04/14/2014 2350   BILITOT 0.54 04/28/2014 0922   BILITOT 0.7 04/14/2014 2350   GFRNONAA 39* 04/14/2014 2350   GFRNONAA 76 12/09/2013 1000   GFRAA 45* 04/14/2014 2350   GFRAA 88 12/09/2013 1000    I No results found for this basename: SPEP,  UPEP,   kappa and lambda light chains    Lab Results  Component Value Date   WBC 7.8 04/28/2014   NEUTROABS 5.3 04/28/2014   HGB 10.0* 04/28/2014   HCT 31.5* 04/28/2014   MCV 85.1 04/28/2014   PLT 118* 04/28/2014      Chemistry      Component Value Date/Time   NA 137 04/28/2014 0922   NA 134* 04/14/2014 2350   K 4.7 04/28/2014 0922   K 5.1 04/14/2014 2350   CL 96 04/14/2014 2350   CO2 19* 04/28/2014 0922   CO2 20 04/14/2014 2350   BUN 27.9* 04/28/2014 0922   BUN 31* 04/14/2014 2350   CREATININE 1.5* 04/28/2014 0922   CREATININE 1.37* 04/14/2014 2350   CREATININE 0.81 12/09/2013 1000   GLU 173 11/08/2010      Component Value Date/Time   CALCIUM 9.1 04/28/2014 0922   CALCIUM 9.0 04/14/2014 2350   CALCIUM 11.1* 03/25/2014 0850   ALKPHOS 76 04/28/2014 0922   ALKPHOS 72 04/14/2014 2350   AST 36* 04/28/2014 0922   AST 29 04/14/2014 2350   ALT 27 04/28/2014 0922   ALT 10 04/14/2014 2350   BILITOT 0.54 04/28/2014 0922   BILITOT 0.7 04/14/2014 2350       No results found for this basename: LABCA2    No components  found with this basename: LABCA125     Recent Labs Lab 04/28/14 0921  INR 1.10*    Urinalysis    Component Value Date/Time   COLORURINE YELLOW 03/24/2014 1827   APPEARANCEUR CLEAR 03/24/2014 1827   LABSPEC 1.028 03/24/2014 1827   PHURINE 5.0 03/24/2014 1827   GLUCOSEU NEGATIVE 03/24/2014 1827   GLUCOSEU NEGATIVE 06/07/2012 1407   HGBUR LARGE* 03/24/2014 1827   BILIRUBINUR NEGATIVE 03/24/2014 1827   BILIRUBINUR neg 02/11/2014 0845   KETONESUR NEGATIVE 03/24/2014  1827   PROTEINUR 30* 03/24/2014 1827   PROTEINUR neg 02/11/2014 0845   UROBILINOGEN 0.2 03/24/2014 1827   UROBILINOGEN 0.2 02/11/2014 0845   NITRITE NEGATIVE 03/24/2014 1827   NITRITE positive 02/11/2014 0845   LEUKOCYTESUR NEGATIVE 03/24/2014 1827    STUDIES: Ct Biopsy  03/30/2014   CLINICAL DATA:  67 year old female with numerous lytic bony lesions concerning for multiple myeloma. CT-guided bone marrow biopsy is requested to confirm the diagnosis.  EXAM: CT BIOPSY  Date: 03/30/2014  PROCEDURE: 1. CT guided bone marrow aspiration and core biopsy Interventional Radiologist:  Criselda Peaches, MD  ANESTHESIA/SEDATION: Moderate (conscious) sedation was used. 0.5 mg Versed, 50 mcg Fentanyl were administered intravenously. The patient's vital signs were monitored continuously by radiology nursing throughout the procedure.  Sedation Time: 10 minutes  TECHNIQUE: Informed consent was obtained from the patient following explanation of the procedure, risks, benefits and alternatives. The patient understands, agrees and consents for the procedure. All questions were addressed. A time out was performed.  The patient was positioned prone and noncontrast localization CT was performed of the pelvis to demonstrate the iliac marrow spaces.  Maximal barrier sterile technique utilized including caps, mask, sterile gowns, sterile gloves, large sterile drape, hand hygiene, and betadine prep.  Under sterile conditions and local anesthesia, an 11 gauge coaxial bone biopsy needle was advanced into the left iliac marrow space. Needle position was confirmed with CT imaging. Initially, bone marrow aspiration was performed. Next, the 11 gauge outer cannula was utilized to obtain a left iliac bone marrow core biopsy. Needle was removed. Hemostasis was obtained with compression. The patient tolerated the procedure well. Samples were prepared with the cytotechnologist. No immediate complications.  IMPRESSION: CT guided left iliac bone marrow  aspiration and core biopsy.  Signed,  Criselda Peaches, MD  [facility]  [facility]   Electronically Signed   By: Jacqulynn Cadet M.D.   On: 03/30/2014 13:45   Dg Bone Survey Met  04/13/2014   CLINICAL DATA:  Multiple myeloma  EXAM: METASTATIC BONE SURVEY  COMPARISON:  Nuclear medicine bone scan March 25, 2014  FINDINGS: Lateral skull: There are multiple subcentimeter lytic lesions throughout the skull consistent with multiple myeloma. Some appears normal.  AP and lateral cervical spine: Bones appear diffusely osteoporotic. There are multiple subtle lytic lesions throughout the cervical spine. No impending fracture. No fracture or spondylolisthesis.  AP and lateral thoracic spine: There are small lytic lesions in the T3, T4, T5, T6, T7, T8, T9, T10, T11, and T12 vertebral bodies, also subcentimeter. Bones are diffusely osteoporotic. There is slight anterior wedging of the T7 vertebral body. There is moderate generalized osteoarthritic change.  AP chest: There is mild atelectatic change in both lower lobes. There is no edema or consolidation. Heart size and pulmonary vascularity are normal. No adenopathy. Bones are somewhat osteoporotic. There is a small lytic lesion in the lateral left seventh rib. There is a small lytic lesion in the anterior right second  rib.  AP and lateral lumbar spine: There is significant collapse of the L1 vertebral body. There are equivocal tiny lytic lesions in the L3 vertebral body. There is subtle mottling in the L5 vertebral body concerning for neoplastic involvement.  Pelvis: There are lytic lesions in the right superior pubic ramus. There are lytic lesions in the intertrochanteric through femur regions bilaterally. There is moderate osteoarthritic change in both hip joints. There is a subtle lytic lesion in the lateral and mid right ischium. There is no fracture or impending fracture.  Right femur/tibia and fibula: Small lytic  lesions are identified in the femoral neck region. There is a subtle small lytic lesion in the mid femur region. No fracture or impending fracture.  Left femur/tibia and fibula: There is a total knee replacement. Small lytic lesions are identified in the intertrochanteric region. No other lytic lesions appreciated. Periosteum intact.  Right shoulder, humerus, and forearm: There are subtle lytic lesions throughout the right scapula and proximal right humerus. No fracture or impending fracture.  Left shoulder, humerus, and forearm: There are multiple small lytic lesions in the scapula and proximal humerus. No fracture or impending fracture.  IMPRESSION: Multiple lytic lesions throughout the skeleton consistent with multiple myeloma. No impending fracture apparent. Note that there is that collapse of the L1 vertebral body. Bones are somewhat diffusely osteoporotic. The L5 vertebral body has a somewhat mottled appearance suggesting somewhat more extensive neoplastic involvement at this level compared to other levels.   Electronically Signed   By: Lowella Grip M.D.   On: 04/13/2014 15:02   ASSESSMENT: 68 y.o. [place] woman presenting with hypercalcemia, anemia and mottled bone lesions April 2015, M-spike 3.82 g, IFE showing IgA/kappa myeloma with bone marrow biopsy 03/30/2014 with 88% plasma cells, FISH  t(4,14), deletion 13 and deletion 11 (intermediate risk); initial beta-2 microglobulin 16.  (1) considered E1A11 study, but unable to tolerate anticoagulation  (2) associated issues:  (a) coagulopathy: history of post-op PE; history of bleeding with warfarin; epistaxis with  ASA 325 ms/ day; switched to 81 mg/ day with fair tolerance  (c) hypercalcemia: started zolendronate 03/26/2014, to be repeated monthly  (d) pain: compression fracture L1, rib fractures: on tylenol and tramadol  (e) immunocompromise: on acyclovir and Septra prophylaxis  (3) started dexamethasone 40 mg/ week (20 mg po on Tues and  Weds April 4315; complicated by hyperglycemia  (4) started bortezomib/ Velcade sQ 04/28/2014, to be given days 1,4, 8 and 11 of ech 21 day cycle  (5) will start lenalidomide once patient able to tolerate full dose ASA anticoagulation  PLAN: Danijela did her best to participate in the available study, but with recurrent epistaxis she was not able to tolerate the minimal amount of anticoagulation prescribed, which would've been 325 mg of aspirin daily. Accordingly she will be treated off study. We are running into problems with the dexamethasone because of hyperglycemia. I am comfortable starting the lenalidomide until we know she can tolerate the ASA 325 mg.  Given those issues, what we did today was start bortezomib. She has a good understanding of the possible toxicities, side effects and complications of this agent, which is not exactly a form of chemotherapy (it is a proteasome inhibitor). It does not cause hair loss, nausea, fatigue, or in most cases significant low counts. It does cause immunosuppression, and can cause peripheral neuropathy. This is usually less of an issue with the subcutaneous dose which is what we are doing.  I have to believe the myeloma is  directly affecting her ability to clot, and I am hoping once we bring the myeloma under control she will be able to go on aspirin 325 mg daily. At that point we would add the lenalidomide, with a view to at some point stopping both the dexamethasone and bortezomib and continue on lenalidomide maintenance.  In the meantime I have strongly urged Davian to start a walking program. She is depressed and discouraged, but I tried to reassure her today that she should be able to tolerate these treatments well and that these treatments are efficacious.  Her family will check her blood sugars 3 times a day, and I wrote a prescription for her to get the test strips she will need to get this done. If we are not able to keep her CBG is under 300 on her  current pills, she may have to go on when necessary insulin on the dexamethasone days.  Io and her family have a good understanding of the overall plan. They agree with it. They know the goal of treatment in her case is control. They will call with any problems that may develop before her next visit here.   Chauncey Cruel, MD   04/28/2014 11:30 AM

## 2014-04-28 NOTE — Telephone Encounter (Signed)
per pof to sch appt/MW sch chemo-gave pt son copy of sch

## 2014-04-29 ENCOUNTER — Other Ambulatory Visit: Payer: Self-pay | Admitting: *Deleted

## 2014-04-29 ENCOUNTER — Other Ambulatory Visit: Payer: Self-pay | Admitting: Emergency Medicine

## 2014-04-29 ENCOUNTER — Telehealth: Payer: Self-pay | Admitting: *Deleted

## 2014-04-29 NOTE — Telephone Encounter (Signed)
Called pharm and was advised that strips will still not go through and a new Medicare form needs to be filled out. Advised her that I had faxed completed a new form on 04/23/14 w/TID testing (see notes under 04/23/14 message). Pharm stated she will try to find a copy of the form and fax it to me so that I can complete again. Notified pt of status.

## 2014-04-29 NOTE — Telephone Encounter (Signed)
Notified pt she should be able to p/up her strips this afternoon.

## 2014-04-29 NOTE — Telephone Encounter (Signed)
Pharm CB and gave me # to [facility] to call and get another form if they did not receive the one sent 04/23/14. Advised by [employer]. That they did receive the form and she is updating her system so pharm can run Rx. Notified pharmacy.

## 2014-04-29 NOTE — Telephone Encounter (Signed)
Spoke with pt today for post chemo follow up call.  Pt stated she felt fine.  Has mild hip pain but takes Tramadol with relief.  Denied nausea/vomiting; stated good appetite and drinking lots of fluids as tolerated.  Bowel and bladder function fine.  Gave pt dietary choices to help with bowel issue. Pt stated she had very good experience yesterday with first chemo treatment.  Staff were courteous, nice, and provided explanations to pt throughout her treatment.  No other concerns at this time.

## 2014-04-30 ENCOUNTER — Other Ambulatory Visit: Payer: Self-pay | Admitting: Oncology

## 2014-04-30 LAB — PROTEIN ELECTROPHORESIS, SERUM
ALBUMIN ELP: 25.6 % — AB (ref 55.8–66.1)
ALPHA-1-GLOBULIN: 2.6 % — AB (ref 2.9–4.9)
ALPHA-2-GLOBULIN: 5 % — AB (ref 7.1–11.8)
Beta 2: 61 % — ABNORMAL HIGH (ref 3.2–6.5)
Beta Globulin: 3.8 % — ABNORMAL LOW (ref 4.7–7.2)
Gamma Globulin: 2 % — ABNORMAL LOW (ref 11.1–18.8)
M-Spike, %: 4.62 g/dL
TOTAL PROTEIN, SERUM ELECTROPHOR: 9.9 g/dL — AB (ref 6.0–8.3)

## 2014-04-30 LAB — KAPPA/LAMBDA LIGHT CHAINS
KAPPA FREE LGHT CHN: 15.1 mg/dL — AB (ref 0.33–1.94)
Kappa:Lambda Ratio: 23.23 — ABNORMAL HIGH (ref 0.26–1.65)
LAMBDA FREE LGHT CHN: 0.65 mg/dL (ref 0.57–2.63)

## 2014-04-30 MED ORDER — ACYCLOVIR 400 MG PO TABS: 400.0000 mg | ORAL_TABLET | Freq: Every day | ORAL | Status: DC

## 2014-05-01 ENCOUNTER — Ambulatory Visit (HOSPITAL_BASED_OUTPATIENT_CLINIC_OR_DEPARTMENT_OTHER): Payer: Medicare Other

## 2014-05-01 ENCOUNTER — Other Ambulatory Visit (HOSPITAL_BASED_OUTPATIENT_CLINIC_OR_DEPARTMENT_OTHER): Payer: Medicare Other

## 2014-05-01 VITALS — BP 147/76 | HR 74 | Temp 97.7°F

## 2014-05-01 DIAGNOSIS — Z5112 Encounter for antineoplastic immunotherapy: Secondary | ICD-10-CM

## 2014-05-01 DIAGNOSIS — C9000 Multiple myeloma not having achieved remission: Secondary | ICD-10-CM

## 2014-05-01 LAB — CBC WITH DIFFERENTIAL/PLATELET
BASO%: 0.1 % (ref 0.0–2.0)
Basophils Absolute: 0 10*3/uL (ref 0.0–0.1)
EOS%: 0.2 % (ref 0.0–7.0)
Eosinophils Absolute: 0 10*3/uL (ref 0.0–0.5)
HCT: 28.7 % — ABNORMAL LOW (ref 34.8–46.6)
HGB: 9.1 g/dL — ABNORMAL LOW (ref 11.6–15.9)
LYMPH#: 1.4 10*3/uL (ref 0.9–3.3)
LYMPH%: 13.1 % — ABNORMAL LOW (ref 14.0–49.7)
MCH: 27 pg (ref 25.1–34.0)
MCHC: 31.7 g/dL (ref 31.5–36.0)
MCV: 85.2 fL (ref 79.5–101.0)
MONO#: 1.2 10*3/uL — ABNORMAL HIGH (ref 0.1–0.9)
MONO%: 11.6 % (ref 0.0–14.0)
NEUT#: 7.8 10*3/uL — ABNORMAL HIGH (ref 1.5–6.5)
NEUT%: 75 % (ref 38.4–76.8)
Platelets: 102 10*3/uL — ABNORMAL LOW (ref 145–400)
RBC: 3.37 10*6/uL — ABNORMAL LOW (ref 3.70–5.45)
RDW: 21.1 % — ABNORMAL HIGH (ref 11.2–14.5)
WBC: 10.4 10*3/uL — ABNORMAL HIGH (ref 3.9–10.3)
nRBC: 2 % — ABNORMAL HIGH (ref 0–0)

## 2014-05-01 LAB — COMPREHENSIVE METABOLIC PANEL (CC13)
ALK PHOS: 76 U/L (ref 40–150)
ALT: 22 U/L (ref 0–55)
AST: 25 U/L (ref 5–34)
Albumin: 2.3 g/dL — ABNORMAL LOW (ref 3.5–5.0)
Anion Gap: 16 mEq/L — ABNORMAL HIGH (ref 3–11)
BILIRUBIN TOTAL: 0.36 mg/dL (ref 0.20–1.20)
BUN: 36.1 mg/dL — ABNORMAL HIGH (ref 7.0–26.0)
CO2: 18 mEq/L — ABNORMAL LOW (ref 22–29)
Calcium: 8.9 mg/dL (ref 8.4–10.4)
Chloride: 103 mEq/L (ref 98–109)
Creatinine: 1.4 mg/dL — ABNORMAL HIGH (ref 0.6–1.1)
GLUCOSE: 219 mg/dL — AB (ref 70–140)
Potassium: 4.4 mEq/L (ref 3.5–5.1)
SODIUM: 137 meq/L (ref 136–145)
TOTAL PROTEIN: 10.6 g/dL — AB (ref 6.4–8.3)

## 2014-05-01 LAB — TECHNOLOGIST REVIEW

## 2014-05-01 MED ORDER — ONDANSETRON HCL 8 MG PO TABS
ORAL_TABLET | ORAL | Status: AC
  Filled 2014-05-01: qty 1

## 2014-05-01 MED ORDER — BORTEZOMIB CHEMO SQ INJECTION 3.5 MG (2.5MG/ML)
1.3000 mg/m2 | Freq: Once | INTRAMUSCULAR | Status: AC
  Administered 2014-05-01: 2.5 mg via SUBCUTANEOUS
  Filled 2014-05-01: qty 2.5

## 2014-05-01 MED ORDER — ONDANSETRON HCL 8 MG PO TABS
8.0000 mg | ORAL_TABLET | Freq: Once | ORAL | Status: AC
  Administered 2014-05-01: 8 mg via ORAL

## 2014-05-04 ENCOUNTER — Telehealth: Payer: Self-pay

## 2014-05-04 ENCOUNTER — Telehealth: Payer: Self-pay | Admitting: Pharmacist

## 2014-05-04 NOTE — Telephone Encounter (Signed)
Pt stated she still could not get her testing strips covered for another 16 days. Called

## 2014-05-04 NOTE — Telephone Encounter (Signed)
Called [facility] and pharm stated it is still telling them it is too soon to run. Advised her that I had gotten all of this straight w/their Medicare dept on 04/29/14 and she agreed to give their Medicare dept a call.

## 2014-05-04 NOTE — Telephone Encounter (Signed)
I s/w Dr. Jana Hakim re: starting ESA tx on this pt since her Hgb = 9.1 g/dL on 05/01/14. He requested that I document this phone encounter so that he could be reminded to discuss w/ pt on 05/19/14 when pt is seen by Micah Flesher, PA. Kennith Center, Pharm.D., CPP 05/04/2014@1 :47 PM

## 2014-05-04 NOTE — Telephone Encounter (Signed)
Pt called and stated that Dr Mare Ferrari wants Dr Everlene Farrier to Rx her glimeperide for her now. The hospitalist just sent it in for the wrong amount/sig. She is supposed to be taking 2 tabs Qam and IF BS OVER 200 take 1 in pm. Pended for review.

## 2014-05-05 ENCOUNTER — Encounter: Payer: Self-pay | Admitting: *Deleted

## 2014-05-05 ENCOUNTER — Other Ambulatory Visit (HOSPITAL_BASED_OUTPATIENT_CLINIC_OR_DEPARTMENT_OTHER): Payer: Medicare Other

## 2014-05-05 ENCOUNTER — Telehealth: Payer: Self-pay | Admitting: *Deleted

## 2014-05-05 ENCOUNTER — Ambulatory Visit (HOSPITAL_BASED_OUTPATIENT_CLINIC_OR_DEPARTMENT_OTHER): Payer: Medicare Other

## 2014-05-05 VITALS — BP 136/85 | HR 87 | Temp 97.5°F | Resp 18

## 2014-05-05 DIAGNOSIS — C9000 Multiple myeloma not having achieved remission: Secondary | ICD-10-CM

## 2014-05-05 DIAGNOSIS — Z5112 Encounter for antineoplastic immunotherapy: Secondary | ICD-10-CM

## 2014-05-05 LAB — CBC WITH DIFFERENTIAL/PLATELET
BASO%: 0.2 % (ref 0.0–2.0)
BASOS ABS: 0 10*3/uL (ref 0.0–0.1)
EOS%: 1.8 % (ref 0.0–7.0)
Eosinophils Absolute: 0.1 10*3/uL (ref 0.0–0.5)
HEMATOCRIT: 32.7 % — AB (ref 34.8–46.6)
HEMOGLOBIN: 10.5 g/dL — AB (ref 11.6–15.9)
LYMPH%: 20.6 % (ref 14.0–49.7)
MCH: 27.1 pg (ref 25.1–34.0)
MCHC: 32.1 g/dL (ref 31.5–36.0)
MCV: 84.5 fL (ref 79.5–101.0)
MONO#: 0.5 10*3/uL (ref 0.1–0.9)
MONO%: 11.8 % (ref 0.0–14.0)
NEUT#: 2.9 10*3/uL (ref 1.5–6.5)
NEUT%: 65.6 % (ref 38.4–76.8)
Platelets: 102 10*3/uL — ABNORMAL LOW (ref 145–400)
RBC: 3.87 10*6/uL (ref 3.70–5.45)
RDW: 20.6 % — AB (ref 11.2–14.5)
WBC: 4.4 10*3/uL (ref 3.9–10.3)
lymph#: 0.9 10*3/uL (ref 0.9–3.3)
nRBC: 2 % — ABNORMAL HIGH (ref 0–0)

## 2014-05-05 LAB — PROTEIN ELECTROPHORESIS, SERUM
Albumin ELP: 27.1 % — ABNORMAL LOW (ref 55.8–66.1)
Alpha-1-Globulin: 2.9 % (ref 2.9–4.9)
Alpha-2-Globulin: 4.9 % — ABNORMAL LOW (ref 7.1–11.8)
Beta 2: 59.5 % — ABNORMAL HIGH (ref 3.2–6.5)
Beta Globulin: 4.1 % — ABNORMAL LOW (ref 4.7–7.2)
Gamma Globulin: 1.5 % — ABNORMAL LOW (ref 11.1–18.8)
M-SPIKE, %: 5.08 g/dL
Total Protein, Serum Electrophoresis: 10.3 g/dL — ABNORMAL HIGH (ref 6.0–8.3)

## 2014-05-05 LAB — COMPREHENSIVE METABOLIC PANEL (CC13)
ALBUMIN: 2.5 g/dL — AB (ref 3.5–5.0)
ALT: 19 U/L (ref 0–55)
AST: 29 U/L (ref 5–34)
Alkaline Phosphatase: 87 U/L (ref 40–150)
Anion Gap: 14 mEq/L — ABNORMAL HIGH (ref 3–11)
BUN: 37.6 mg/dL — AB (ref 7.0–26.0)
CALCIUM: 8.6 mg/dL (ref 8.4–10.4)
CHLORIDE: 104 meq/L (ref 98–109)
CO2: 19 meq/L — AB (ref 22–29)
CREATININE: 1.3 mg/dL — AB (ref 0.6–1.1)
Glucose: 254 mg/dl — ABNORMAL HIGH (ref 70–140)
Potassium: 4.9 mEq/L (ref 3.5–5.1)
Sodium: 136 mEq/L (ref 136–145)
Total Bilirubin: 0.49 mg/dL (ref 0.20–1.20)
Total Protein: 9 g/dL — ABNORMAL HIGH (ref 6.4–8.3)

## 2014-05-05 LAB — KAPPA/LAMBDA LIGHT CHAINS
Kappa free light chain: 9.04 mg/dL — ABNORMAL HIGH (ref 0.33–1.94)
Kappa:Lambda Ratio: 82.18 — ABNORMAL HIGH (ref 0.26–1.65)
Lambda Free Lght Chn: 0.11 mg/dL — ABNORMAL LOW (ref 0.57–2.63)

## 2014-05-05 LAB — IGG, IGA, IGM
IgA: 5570 mg/dL — ABNORMAL HIGH (ref 69–380)
IgG (Immunoglobin G), Serum: 210 mg/dL — ABNORMAL LOW (ref 690–1700)
IgM, Serum: 5 mg/dL — ABNORMAL LOW (ref 52–322)

## 2014-05-05 MED ORDER — BORTEZOMIB CHEMO SQ INJECTION 3.5 MG (2.5MG/ML)
1.3000 mg/m2 | Freq: Once | INTRAMUSCULAR | Status: AC
  Administered 2014-05-05: 2.5 mg via SUBCUTANEOUS
  Filled 2014-05-05: qty 2.5

## 2014-05-05 MED ORDER — ACYCLOVIR 400 MG PO TABS: 400.0000 mg | ORAL_TABLET | Freq: Two times a day (BID) | ORAL | Status: DC

## 2014-05-05 MED ORDER — ONDANSETRON HCL 8 MG PO TABS
8.0000 mg | ORAL_TABLET | Freq: Once | ORAL | Status: AC
  Administered 2014-05-05: 8 mg via ORAL

## 2014-05-05 MED ORDER — ONDANSETRON HCL 8 MG PO TABS
ORAL_TABLET | ORAL | Status: AC
  Filled 2014-05-05: qty 1

## 2014-05-05 NOTE — Telephone Encounter (Signed)
Received call from pt stating that she picked up script for acyclovir which says to take 5 x/d & she took & it is making her sick.  She feels like she will vomit.  She did take with food.  Discussed with Dr. Jana Hakim & pt should take acyclovir bid & suggested she hold today & try again tomorrow & only take twice daily. She expressed understanding.  She does have something for nausea.  She also asked about her decadron.  She did not take today but will take tomorrow & thurs.

## 2014-05-05 NOTE — Patient Instructions (Signed)

## 2014-05-05 NOTE — Progress Notes (Signed)
[place] Social Work  Clinical Social Work was referred by Therapist, sports  to review and complete healthcare advance directives.  Clinical Social Worker met with patient and spouse in [place] office.  The patient designated spouse [name] as their primary healthcare agent and son [name] as their secondary agent.  Patient also completed healthcare living will.    Clinical Social Worker notarized documents and made copies for patient/family. Clinical Social Worker will send documents to medical records to be scanned into patient's chart. Clinical Social Worker encouraged patient/family to contact with any additional questions or concerns.  Polo Riley, MSW, [place] Worker [facility] [phone]

## 2014-05-07 ENCOUNTER — Encounter: Payer: Self-pay | Admitting: Oncology

## 2014-05-07 ENCOUNTER — Other Ambulatory Visit: Payer: Self-pay | Admitting: *Deleted

## 2014-05-07 DIAGNOSIS — C9000 Multiple myeloma not having achieved remission: Secondary | ICD-10-CM

## 2014-05-07 NOTE — Progress Notes (Signed)
Faxed son's fmla form to [place] @ [phone]

## 2014-05-07 NOTE — Progress Notes (Signed)
Put son's fmla form on nurse's desk. °

## 2014-05-08 ENCOUNTER — Other Ambulatory Visit (HOSPITAL_BASED_OUTPATIENT_CLINIC_OR_DEPARTMENT_OTHER): Payer: Medicare Other

## 2014-05-08 ENCOUNTER — Ambulatory Visit (HOSPITAL_BASED_OUTPATIENT_CLINIC_OR_DEPARTMENT_OTHER): Payer: Medicare Other

## 2014-05-08 VITALS — BP 137/79 | HR 86 | Temp 97.5°F

## 2014-05-08 DIAGNOSIS — Z5112 Encounter for antineoplastic immunotherapy: Secondary | ICD-10-CM

## 2014-05-08 DIAGNOSIS — C9000 Multiple myeloma not having achieved remission: Secondary | ICD-10-CM

## 2014-05-08 LAB — COMPREHENSIVE METABOLIC PANEL (CC13)
ALT: 24 U/L (ref 0–55)
ANION GAP: 11 meq/L (ref 3–11)
AST: 15 U/L (ref 5–34)
Albumin: 2.7 g/dL — ABNORMAL LOW (ref 3.5–5.0)
Alkaline Phosphatase: 119 U/L (ref 40–150)
BILIRUBIN TOTAL: 0.44 mg/dL (ref 0.20–1.20)
BUN: 48.9 mg/dL — ABNORMAL HIGH (ref 7.0–26.0)
CO2: 22 mEq/L (ref 22–29)
CREATININE: 1.3 mg/dL — AB (ref 0.6–1.1)
Calcium: 8.6 mg/dL (ref 8.4–10.4)
Chloride: 103 mEq/L (ref 98–109)
GLUCOSE: 192 mg/dL — AB (ref 70–140)
Potassium: 5.1 mEq/L (ref 3.5–5.1)
Sodium: 135 mEq/L — ABNORMAL LOW (ref 136–145)
TOTAL PROTEIN: 7.9 g/dL (ref 6.4–8.3)

## 2014-05-08 LAB — CBC WITH DIFFERENTIAL/PLATELET
BASO%: 0 % (ref 0.0–2.0)
BASOS ABS: 0 10*3/uL (ref 0.0–0.1)
EOS%: 0.2 % (ref 0.0–7.0)
Eosinophils Absolute: 0 10*3/uL (ref 0.0–0.5)
HCT: 32 % — ABNORMAL LOW (ref 34.8–46.6)
HGB: 10.5 g/dL — ABNORMAL LOW (ref 11.6–15.9)
LYMPH%: 22.7 % (ref 14.0–49.7)
MCH: 27.5 pg (ref 25.1–34.0)
MCHC: 32.8 g/dL (ref 31.5–36.0)
MCV: 83.8 fL (ref 79.5–101.0)
MONO#: 1 10*3/uL — ABNORMAL HIGH (ref 0.1–0.9)
MONO%: 21.3 % — AB (ref 0.0–14.0)
NEUT%: 55.8 % (ref 38.4–76.8)
NEUTROS ABS: 2.7 10*3/uL (ref 1.5–6.5)
Platelets: 105 10*3/uL — ABNORMAL LOW (ref 145–400)
RBC: 3.82 10*6/uL (ref 3.70–5.45)
RDW: 20.6 % — ABNORMAL HIGH (ref 11.2–14.5)
WBC: 4.9 10*3/uL (ref 3.9–10.3)
lymph#: 1.1 10*3/uL (ref 0.9–3.3)
nRBC: 1 % — ABNORMAL HIGH (ref 0–0)

## 2014-05-08 LAB — PROTEIN ELECTROPHORESIS, SERUM
ALBUMIN ELP: 34.2 % — AB (ref 55.8–66.1)
ALPHA-1-GLOBULIN: 3.9 % (ref 2.9–4.9)
Alpha-2-Globulin: 6.9 % — ABNORMAL LOW (ref 7.1–11.8)
BETA GLOBULIN: 5 % (ref 4.7–7.2)
Beta 2: 48.2 % — ABNORMAL HIGH (ref 3.2–6.5)
Gamma Globulin: 1.8 % — ABNORMAL LOW (ref 11.1–18.8)
M-Spike, %: 3.67 g/dL
Total Protein, Serum Electrophoresis: 9.6 g/dL — ABNORMAL HIGH (ref 6.0–8.3)

## 2014-05-08 LAB — KAPPA/LAMBDA LIGHT CHAINS
Kappa free light chain: 5.91 mg/dL — ABNORMAL HIGH (ref 0.33–1.94)
Kappa:Lambda Ratio: 147.75 — ABNORMAL HIGH (ref 0.26–1.65)
Lambda Free Lght Chn: 0.04 mg/dL — ABNORMAL LOW (ref 0.57–2.63)

## 2014-05-08 MED ORDER — ONDANSETRON HCL 8 MG PO TABS
ORAL_TABLET | ORAL | Status: AC
  Filled 2014-05-08: qty 1

## 2014-05-08 MED ORDER — GLIMEPIRIDE 2 MG PO TABS
ORAL_TABLET | ORAL | Status: DC
Start: ? — End: 2014-06-01

## 2014-05-08 MED ORDER — ONDANSETRON HCL 8 MG PO TABS
8.0000 mg | ORAL_TABLET | Freq: Once | ORAL | Status: AC
  Administered 2014-05-08: 8 mg via ORAL

## 2014-05-08 MED ORDER — BORTEZOMIB CHEMO SQ INJECTION 3.5 MG (2.5MG/ML)
1.3000 mg/m2 | Freq: Once | INTRAMUSCULAR | Status: AC
  Administered 2014-05-08: 2.5 mg via SUBCUTANEOUS
  Filled 2014-05-08: qty 2.5

## 2014-05-08 NOTE — Patient Instructions (Signed)
[facility] Discharge Instructions for Patients Receiving Chemotherapy  Today you received the following chemotherapy agents: vidaza  To help prevent nausea and vomiting after your treatment, we encourage you to take your nausea medication.  Take it as often as prescribed.     If you develop nausea and vomiting that is not controlled by your nausea medication, call the clinic. If it is after clinic hours your family physician or the after hours number for the clinic or go to the Emergency Department.   BELOW ARE SYMPTOMS THAT SHOULD BE REPORTED IMMEDIATELY:  *FEVER GREATER THAN 100.5 F  *CHILLS WITH OR WITHOUT FEVER  NAUSEA AND VOMITING THAT IS NOT CONTROLLED WITH YOUR NAUSEA MEDICATION  *UNUSUAL SHORTNESS OF BREATH  *UNUSUAL BRUISING OR BLEEDING  TENDERNESS IN MOUTH AND THROAT WITH OR WITHOUT PRESENCE OF ULCERS  *URINARY PROBLEMS  *BOWEL PROBLEMS  UNUSUAL RASH Items with * indicate a potential emergency and should be followed up as soon as possible.  Feel free to call the clinic you have any questions or concerns. The clinic phone number is [phone].   I have been informed and understand all the instructions given to me. I know to contact the clinic, my physician, or go to the Emergency Department if any problems should occur. I do not have any questions at this time, but understand that I may call the clinic during office hours   should I have any questions or need assistance in obtaining follow up care.    __________________________________________  _____________  __________ Signature of Patient or Authorized Representative            Date                   Time    __________________________________________ Nurse's Signature    

## 2014-05-12 ENCOUNTER — Telehealth: Payer: Self-pay | Admitting: *Deleted

## 2014-05-12 ENCOUNTER — Telehealth: Payer: Self-pay | Admitting: Physician Assistant

## 2014-05-12 ENCOUNTER — Other Ambulatory Visit: Payer: Self-pay | Admitting: *Deleted

## 2014-05-12 NOTE — Telephone Encounter (Signed)
, °

## 2014-05-12 NOTE — Telephone Encounter (Signed)
Per staff message and POF I have scheduled appts.  JMW  

## 2014-05-13 ENCOUNTER — Ambulatory Visit (INDEPENDENT_AMBULATORY_CARE_PROVIDER_SITE_OTHER): Payer: Medicare Other | Admitting: Cardiology

## 2014-05-13 ENCOUNTER — Encounter: Payer: Self-pay | Admitting: Cardiology

## 2014-05-13 VITALS — BP 122/70 | HR 100 | Ht 62.0 in | Wt 169.0 lb

## 2014-05-13 DIAGNOSIS — C9000 Multiple myeloma not having achieved remission: Secondary | ICD-10-CM

## 2014-05-13 DIAGNOSIS — I219 Acute myocardial infarction, unspecified: Secondary | ICD-10-CM

## 2014-05-13 DIAGNOSIS — I2699 Other pulmonary embolism without acute cor pulmonale: Secondary | ICD-10-CM

## 2014-05-13 DIAGNOSIS — I2510 Atherosclerotic heart disease of native coronary artery without angina pectoris: Secondary | ICD-10-CM

## 2014-05-13 DIAGNOSIS — E1165 Type 2 diabetes mellitus with hyperglycemia: Secondary | ICD-10-CM

## 2014-05-13 DIAGNOSIS — IMO0001 Reserved for inherently not codable concepts without codable children: Secondary | ICD-10-CM

## 2014-05-13 DIAGNOSIS — I213 ST elevation (STEMI) myocardial infarction of unspecified site: Secondary | ICD-10-CM

## 2014-05-13 NOTE — Progress Notes (Signed)
Steele Berg Date of Birth:  1946-02-15 [facility] [address] [place], [place]  [address] [phone]        Fax   [phone]   History of Present Illness: This pleasant 68 year old woman is seen for a scheduled followup office visit.  Since we last saw her she has been diagnosed with multiple myeloma.  Dr. Griffith Citron is her oncologist.  She is on chemotherapy.  Since last visit here in January she has lost 25 pounds per She has a prior history of known ischemic heart disease. On 05/29/12 she presented with acute chest pain and had a STEMI and was taken to the Cath Lab by Dr. Irish Lack who placed a drug-eluting stent into an occluded right coronary artery. She was on dual antiplatelet therapy with Brilinta and aspirin for more than one year. Her Brilinta was stopped in anticipation of recent left total knee surgery and she is continued on aspirin The patient also has a history of hypercholesterolemia, diabetes, and neurofibromatosis. The patient underwent left total knee replacement by Dr. Ronnie Derby on 09/15/13. 2 days later she had acute dyspnea and was found to have pulmonary emboli on 09/17/13. Venous duplex of her lower extremities was negative for deep vein thrombosis.  She was treated with Coumadin but it had to be stopped because of severe intractable epistaxis.  At the present time she is on just 81 mg aspirin.  Current Outpatient Prescriptions  Medication Sig Dispense Refill  . acetaminophen (TYLENOL) 325 MG tablet Take 1-2 tablets (325-650 mg total) by mouth every 4 (four) hours as needed for mild pain.      Marland Kitchen acyclovir (ZOVIRAX) 400 MG tablet Take 1 tablet (400 mg total) by mouth 2 (two) times daily. Order was written for 5 times daily but Dr. Jana Hakim said it should be bid.  60 tablet  6  . aspirin EC 81 MG EC tablet Take 1 tablet (81 mg total) by mouth daily.  30 tablet  0  . dexamethasone (DECADRON) 4 MG tablet Take 5 tablets (20 mg total) by mouth as  directed.  40 tablet  6  . fluconazole (DIFLUCAN) 100 MG tablet       . glimepiride (AMARYL) 2 MG tablet Take 2 tablets by mouth every morning with breakfast. If blood sugar is higher than 200, take 1 tablet at night.  90 tablet  1  . glucose blood (ONE TOUCH ULTRA TEST) test strip 1 each by Other route 3 (three) times daily.  100 each  12  . Lancets MISC Check glucose 3 times daily  100 each  6  . methocarbamol (ROBAXIN) 500 MG tablet Take 500 mg by mouth every 6 (six) hours as needed for muscle spasms.      . metoprolol succinate (TOPROL-XL) 25 MG 24 hr tablet       . metoprolol tartrate (LOPRESSOR) 25 MG tablet Take 25 mg by mouth 2 (two) times daily.      . Multiple Vitamin (MULTIVITAMIN WITH MINERALS) TABS tablet Take 1 tablet by mouth at bedtime. Centrum Silver      . Multiple Vitamins-Minerals (OCUVITE PO) Take 1 tablet by mouth daily.       . nitroGLYCERIN (NITROSTAT) 0.4 MG SL tablet Place 1 tablet (0.4 mg total) under the tongue every 5 (five) minutes as needed for chest pain (up to 3 doses).  25 tablet  3  . nystatin (MYCOSTATIN/NYSTOP) 100000 UNIT/GM POWD Apply 1 g topically 3 (three) times daily.      Marland Kitchen  polyethylene glycol (MIRALAX / GLYCOLAX) packet Take 17 g by mouth daily.      . prochlorperazine (COMPAZINE) 10 MG tablet Take 1 tablet (10 mg total) by mouth every 6 (six) hours as needed (Nausea or vomiting).  30 tablet  1  . sennosides-docusate sodium (SENOKOT-S) 8.6-50 MG tablet Take 2 tablets by mouth at bedtime.      . traMADol (ULTRAM) 50 MG tablet Take 1 tablet (50 mg total) by mouth every 6 (six) hours as needed for moderate pain.  30 tablet  2   No current facility-administered medications for this visit.   Facility-Administered Medications Ordered in Other Visits  Medication Dose Route Frequency Provider Last Rate Last Dose  . 0.45 % sodium chloride infusion   Intravenous Continuous Chauncey Cruel, MD        Allergies  Allergen Reactions  . Codeine Nausea And  Vomiting    Sees things  . Hydrocodone Nausea And Vomiting    Sees things  . Niaspan [Niacin Er] Nausea Only  . Robaxin [Methocarbamol] Other (See Comments)    Made patient feel funny  . Statins Nausea And Vomiting and Other (See Comments)    Myalgias/leg pain with atorvastatin, rosuvastatin, lovastatin, simvastatin  . Tetracycline Other (See Comments)    Pt doesn't remember reaction  . Zetia [Ezetimibe] Nausea And Vomiting  . Zithromax [Azithromycin] Nausea And Vomiting    Patient Active Problem List   Diagnosis Date Noted  . Fatigue 03/10/2011    Priority: High  . Chest pain at rest 03/10/2011    Priority: High  . Acute sinusitis, unspecified 03/10/2011    Priority: High  . HTN (hypertension)     Priority: Medium  . Epistaxis 04/14/2014  . Multiple myeloma, without mention of having achieved remission 04/07/2014  . Physical deconditioning 03/31/2014  . Hypercalcemia 03/25/2014  . Major hemoptysis 03/25/2014  . Acute blood loss anemia 03/25/2014  . Vertebral fracture, closed 03/25/2014  . Hemoptysis 03/24/2014  . Pneumonia, organism unspecified 03/24/2014  . Encounter for therapeutic drug monitoring 01/14/2014  . Fe deficiency anemia 12/31/2013  . Long term (current) use of anticoagulants 09/24/2013  . Pulmonary embolism 09/18/2013  . Acute pulmonary embolism 09/17/2013  . Hypoxemia 09/17/2013  . Onychomycosis 04/29/2013  . Pain in joint, ankle and foot 04/29/2013  . Benign hypertensive heart disease without heart failure 10/15/2012  . STEMI (ST elevation myocardial infarction) 05/30/2012  . Dyspnea 11/15/2011  . Herpes zoster 07/17/2011  . Neurofibromatosis   . Type II or unspecified type diabetes mellitus without mention of complication, uncontrolled   . Obesity   . Osteoarthritis, knee     History  Smoking status  . Never Smoker   Smokeless tobacco  . Never Used    History  Alcohol Use No    Family History  Problem Relation Age of Onset  . Heart  disease Mother   . Arthritis Mother   . Stroke Mother   . Heart disease Son     Review of Systems: Constitutional: no fever chills diaphoresis or fatigue or change in weight.  Head and neck: no hearing loss, no epistaxis, no photophobia or visual disturbance. Respiratory: No cough, shortness of breath or wheezing. Cardiovascular: No chest pain peripheral edema, palpitations. Gastrointestinal: No abdominal distention, no abdominal pain, no change in bowel habits hematochezia or melena. Genitourinary: No dysuria, no frequency, no urgency, no nocturia. Musculoskeletal:No arthralgias, no back pain, no gait disturbance or myalgias. Neurological: No dizziness, no headaches, no numbness, no seizures, no  syncope, no weakness, no tremors. Hematologic: No lymphadenopathy, no easy bruising. Psychiatric: No confusion, no hallucinations, no sleep disturbance.    Physical Exam: Filed Vitals:   05/13/14 1610  BP: 122/70  Pulse: 100   the general appearance reveals a chronically ill-appearing woman who is the appearance of significant weight loss.  Her weight is down 25 pounds since January 2015.The head and neck exam reveals pupils equal and reactive.  Extraocular movements are full.  There is no scleral icterus.  The mouth and pharynx are normal.  The neck is supple.  The carotids reveal no bruits.  The jugular venous pressure is normal.  The  thyroid is not enlarged.  There is no lymphadenopathy.  The chest is clear to percussion and auscultation.  There are no rales or rhonchi.  Expansion of the chest is symmetrical.  The precordium is quiet.  The first heart sound is normal.  The second heart sound is physiologically split.  There is no murmur gallop rub or click.  There is no abnormal lift or heave.  The abdomen is soft and nontender.  The bowel sounds are normal.  The liver and spleen are not enlarged.  There are no abdominal masses.  There are no abdominal bruits.  Extremities reveal good pedal  pulses.  There is no phlebitis or edema.  There is no cyanosis or clubbing.  Strength is normal and symmetrical in all extremities.  There is no lateralizing weakness.  There are no sensory deficits.  The skin is warm and dry.  There is no rash.  The skin lesions of neurofibromatosis.     Assessment / Plan: 1. ischemic heart disease status post acute inferior wall myocardial infarction on 05/29/12 requiring a drug-eluting stent. 2. weight loss secondary to multiple myeloma 3. diabetes mellitus 4. 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 previous postoperative pulmonary embolism after knee surgery in October 2014.  Plan: Continue same medication.  Recheck in 4 months for office visit and EKG

## 2014-05-13 NOTE — Assessment & Plan Note (Signed)
The patient is not having any chest pain to suggest recurrent angina pectoris.

## 2014-05-13 NOTE — Assessment & Plan Note (Signed)
The patient is on chemotherapy for multiple myeloma which includes taking steroids.  This is caused her blood sugars to be much higher than previously.

## 2014-05-13 NOTE — Assessment & Plan Note (Signed)
The patient has had no recurrent symptoms to suggest pulmonary embolus.  She is no longer on anticoagulation other than for a baby aspirin

## 2014-05-13 NOTE — Patient Instructions (Signed)
Your physician recommends that you continue on your current medications as directed. Please refer to the Current Medication list given to you today.  Your physician wants you to follow-up in: 4 month ov/ekg  You will receive a reminder letter in the mail two months in advance. If you don't receive a letter, please call our office to schedule the follow-up appointment.  

## 2014-05-15 ENCOUNTER — Telehealth: Payer: Self-pay

## 2014-05-15 NOTE — Telephone Encounter (Signed)
[facility] to ask for info pertaining to a supplemental order faxed to Dr Everlene Farrier. Order is for change of treatment adding prochlorperazine (compazine) to pt's med list. I was advised that this med has already been ordered and they just wanted Dr Everlene Farrier to sign that he is aware that pt is now taking it. This med has already been added to med list. Faxed signed form back to [facility].

## 2014-05-19 ENCOUNTER — Telehealth: Payer: Self-pay | Admitting: Physician Assistant

## 2014-05-19 ENCOUNTER — Telehealth: Payer: Self-pay | Admitting: *Deleted

## 2014-05-19 ENCOUNTER — Other Ambulatory Visit: Payer: Self-pay | Admitting: Oncology

## 2014-05-19 ENCOUNTER — Other Ambulatory Visit (HOSPITAL_BASED_OUTPATIENT_CLINIC_OR_DEPARTMENT_OTHER): Payer: Medicare Other

## 2014-05-19 ENCOUNTER — Encounter: Payer: Self-pay | Admitting: Physician Assistant

## 2014-05-19 ENCOUNTER — Ambulatory Visit (HOSPITAL_BASED_OUTPATIENT_CLINIC_OR_DEPARTMENT_OTHER): Payer: Medicare Other

## 2014-05-19 ENCOUNTER — Ambulatory Visit (HOSPITAL_BASED_OUTPATIENT_CLINIC_OR_DEPARTMENT_OTHER): Payer: Medicare Other | Admitting: Physician Assistant

## 2014-05-19 ENCOUNTER — Ambulatory Visit: Payer: Medicare Other | Admitting: Oncology

## 2014-05-19 VITALS — BP 117/76 | HR 108 | Temp 97.8°F | Resp 18 | Ht 62.0 in | Wt 169.7 lb

## 2014-05-19 DIAGNOSIS — C9000 Multiple myeloma not having achieved remission: Secondary | ICD-10-CM

## 2014-05-19 DIAGNOSIS — Z7901 Long term (current) use of anticoagulants: Secondary | ICD-10-CM

## 2014-05-19 DIAGNOSIS — E1165 Type 2 diabetes mellitus with hyperglycemia: Secondary | ICD-10-CM

## 2014-05-19 DIAGNOSIS — M79605 Pain in left leg: Secondary | ICD-10-CM

## 2014-05-19 DIAGNOSIS — D62 Acute posthemorrhagic anemia: Secondary | ICD-10-CM

## 2014-05-19 DIAGNOSIS — Z5112 Encounter for antineoplastic immunotherapy: Secondary | ICD-10-CM

## 2014-05-19 DIAGNOSIS — I82409 Acute embolism and thrombosis of unspecified deep veins of unspecified lower extremity: Secondary | ICD-10-CM

## 2014-05-19 DIAGNOSIS — IMO0002 Reserved for concepts with insufficient information to code with codable children: Secondary | ICD-10-CM

## 2014-05-19 DIAGNOSIS — IMO0001 Reserved for inherently not codable concepts without codable children: Secondary | ICD-10-CM

## 2014-05-19 LAB — CBC WITH DIFFERENTIAL/PLATELET
BASO%: 0.1 % (ref 0.0–2.0)
Basophils Absolute: 0 10*3/uL (ref 0.0–0.1)
EOS%: 0.1 % (ref 0.0–7.0)
Eosinophils Absolute: 0 10*3/uL (ref 0.0–0.5)
HCT: 32.5 % — ABNORMAL LOW (ref 34.8–46.6)
HGB: 10.3 g/dL — ABNORMAL LOW (ref 11.6–15.9)
LYMPH%: 3.4 % — AB (ref 14.0–49.7)
MCH: 27.4 pg (ref 25.1–34.0)
MCHC: 31.7 g/dL (ref 31.5–36.0)
MCV: 86.4 fL (ref 79.5–101.0)
MONO#: 0.2 10*3/uL (ref 0.1–0.9)
MONO%: 1.6 % (ref 0.0–14.0)
NEUT#: 8.7 10*3/uL — ABNORMAL HIGH (ref 1.5–6.5)
NEUT%: 94.8 % — ABNORMAL HIGH (ref 38.4–76.8)
PLATELETS: 333 10*3/uL (ref 145–400)
RBC: 3.76 10*6/uL (ref 3.70–5.45)
RDW: 19.8 % — AB (ref 11.2–14.5)
WBC: 9.2 10*3/uL (ref 3.9–10.3)
lymph#: 0.3 10*3/uL — ABNORMAL LOW (ref 0.9–3.3)

## 2014-05-19 LAB — COMPREHENSIVE METABOLIC PANEL (CC13)
ALBUMIN: 2.7 g/dL — AB (ref 3.5–5.0)
ALK PHOS: 330 U/L — AB (ref 40–150)
ALT: 19 U/L (ref 0–55)
ANION GAP: 14 meq/L — AB (ref 3–11)
AST: 14 U/L (ref 5–34)
BILIRUBIN TOTAL: 0.52 mg/dL (ref 0.20–1.20)
BUN: 19 mg/dL (ref 7.0–26.0)
CO2: 18 meq/L — AB (ref 22–29)
Calcium: 7.9 mg/dL — ABNORMAL LOW (ref 8.4–10.4)
Chloride: 104 mEq/L (ref 98–109)
Creatinine: 1.2 mg/dL — ABNORMAL HIGH (ref 0.6–1.1)
GLUCOSE: 340 mg/dL — AB (ref 70–140)
Potassium: 4.7 mEq/L (ref 3.5–5.1)
Sodium: 136 mEq/L (ref 136–145)
Total Protein: 6.4 g/dL (ref 6.4–8.3)

## 2014-05-19 MED ORDER — ONDANSETRON HCL 8 MG PO TABS
8.0000 mg | ORAL_TABLET | Freq: Once | ORAL | Status: AC
  Administered 2014-05-19: 8 mg via ORAL

## 2014-05-19 MED ORDER — ZOLEDRONIC ACID 4 MG/5ML IV CONC
3.5000 mg | Freq: Once | INTRAVENOUS | Status: AC
  Administered 2014-05-19: 3.5 mg via INTRAVENOUS
  Filled 2014-05-19: qty 4.38

## 2014-05-19 MED ORDER — SODIUM CHLORIDE 0.9 % IV SOLN
Freq: Once | INTRAVENOUS | Status: AC
  Administered 2014-05-19: 15:00:00 via INTRAVENOUS

## 2014-05-19 MED ORDER — ONDANSETRON HCL 8 MG PO TABS
ORAL_TABLET | ORAL | Status: AC
  Filled 2014-05-19: qty 1

## 2014-05-19 MED ORDER — BORTEZOMIB CHEMO SQ INJECTION 3.5 MG (2.5MG/ML)
1.3000 mg/m2 | Freq: Once | INTRAMUSCULAR | Status: AC
  Administered 2014-05-19: 2.5 mg via SUBCUTANEOUS
  Filled 2014-05-19: qty 2.5

## 2014-05-19 MED ORDER — ZOLEDRONIC ACID 4 MG/100ML IV SOLN
4.0000 mg | Freq: Once | INTRAVENOUS | Status: DC
  Filled 2014-05-19: qty 100

## 2014-05-19 NOTE — Patient Instructions (Signed)
[name] Discharge Instructions for Patients Receiving Chemotherapy  Today you received the following chemotherapy agents: Velcade  To help prevent nausea and vomiting after your treatment, we encourage you to take your nausea medication as prescribed.    If you develop nausea and vomiting that is not controlled by your nausea medication, call the clinic.   BELOW ARE SYMPTOMS THAT SHOULD BE REPORTED IMMEDIATELY:  *FEVER GREATER THAN 100.5 F  *CHILLS WITH OR WITHOUT FEVER  NAUSEA AND VOMITING THAT IS NOT CONTROLLED WITH YOUR NAUSEA MEDICATION  *UNUSUAL SHORTNESS OF BREATH  *UNUSUAL BRUISING OR BLEEDING  TENDERNESS IN MOUTH AND THROAT WITH OR WITHOUT PRESENCE OF ULCERS  *URINARY PROBLEMS  *BOWEL PROBLEMS  UNUSUAL RASH Items with * indicate a potential emergency and should be followed up as soon as possible.  Feel free to call the clinic you have any questions or concerns. The clinic phone number is [phone].   Zoledronic Acid injection (Hypercalcemia, Oncology) What is this medicine? ZOLEDRONIC ACID (ZOE le dron ik AS id) lowers the amount of calcium loss from bone. It is used to treat too much calcium in your blood from cancer. It is also used to prevent complications of cancer that has spread to the bone. This medicine may be used for other purposes; ask your health care provider or pharmacist if you have questions. COMMON BRAND NAME(S): Zometa What should I tell my health care provider before I take this medicine? They need to know if you have any of these conditions: -aspirin-sensitive asthma -cancer, especially if you are receiving medicines used to treat cancer -dental disease or wear dentures -infection -kidney disease -receiving corticosteroids like dexamethasone or prednisone -an unusual or allergic reaction to zoledronic acid, other medicines, foods, dyes, or preservatives -pregnant or trying to get pregnant -breast-feeding How should I  use this medicine? This medicine is for infusion into a vein. It is given by a health care professional in a hospital or clinic setting. Talk to your pediatrician regarding the use of this medicine in children. Special care may be needed. Overdosage: If you think you have taken too much of this medicine contact a poison control center or emergency room at once. NOTE: This medicine is only for you. Do not share this medicine with others. What if I miss a dose? It is important not to miss your dose. Call your doctor or health care professional if you are unable to keep an appointment. What may interact with this medicine? -certain antibiotics given by injection -NSAIDs, medicines for pain and inflammation, like ibuprofen or naproxen -some diuretics like bumetanide, furosemide -teriparatide -thalidomide This list may not describe all possible interactions. Give your health care provider a list of all the medicines, herbs, non-prescription drugs, or dietary supplements you use. Also tell them if you smoke, drink alcohol, or use illegal drugs. Some items may interact with your medicine. What should I watch for while using this medicine? Visit your doctor or health care professional for regular checkups. It may be some time before you see the benefit from this medicine. Do not stop taking your medicine unless your doctor tells you to. Your doctor may order blood tests or other tests to see how you are doing. Women should inform their doctor if they wish to become pregnant or think they might be pregnant. There is a potential for serious side effects to an unborn child. Talk to your health care professional or pharmacist for more information. You should make sure that you  get enough calcium and vitamin D while you are taking this medicine. Discuss the foods you eat and the vitamins you take with your health care professional. Some people who take this medicine have severe bone, joint, and/or muscle pain.  This medicine may also increase your risk for jaw problems or a broken thigh bone. Tell your doctor right away if you have severe pain in your jaw, bones, joints, or muscles. Tell your doctor if you have any pain that does not go away or that gets worse. Tell your dentist and dental surgeon that you are taking this medicine. You should not have major dental surgery while on this medicine. See your dentist to have a dental exam and fix any dental problems before starting this medicine. Take good care of your teeth while on this medicine. Make sure you see your dentist for regular follow-up appointments. What side effects may I notice from receiving this medicine? Side effects that you should report to your doctor or health care professional as soon as possible: -allergic reactions like skin rash, itching or hives, swelling of the face, lips, or tongue -anxiety, confusion, or depression -breathing problems -changes in vision -eye pain -feeling faint or lightheaded, falls -jaw pain, especially after dental work -mouth sores -muscle cramps, stiffness, or weakness -trouble passing urine or change in the amount of urine Side effects that usually do not require medical attention (report to your doctor or health care professional if they continue or are bothersome): -bone, joint, or muscle pain -constipation -diarrhea -fever -hair loss -irritation at site where injected -loss of appetite -nausea, vomiting -stomach upset -trouble sleeping -trouble swallowing -weak or tired This list may not describe all possible side effects. Call your doctor for medical advice about side effects. You may report side effects to FDA at 1-800-FDA-1088. Where should I keep my medicine? This drug is given in a hospital or clinic and will not be stored at home. NOTE: This sheet is a summary. It may not cover all possible information. If you have questions about this medicine, talk to your doctor, pharmacist, or  health care provider.  2014, [employer]. (2013-05-15 13:03:13)

## 2014-05-19 NOTE — Telephone Encounter (Signed)
Per staff message and POF I have scheduled appts.  JMW  

## 2014-05-19 NOTE — Telephone Encounter (Signed)
, °

## 2014-05-19 NOTE — Progress Notes (Signed)
[place]  [phone] [phone]     ID: [name] OB: [birth year]  MR#: [id number]  KVQ#:[id number]  PCP: Jenny Reichmann, MD GYN:   SU:  OTHER MD: Jodi Marble, MD;  Darlin Coco, MD  CHIEF COMPLAINT: Multiple myeloma, under active treatment  MYELOMA HISTORY: From the original consult note, dated 03/24/2014:  "The patient has a history of PE following arthroscopic surgery,as well as LLE DVT requiring Coumadin therapy since October of 2014. On 4/7 she presented to the ED with a 2 day history of hemoptysis and mucous material. INR was elevated at 6 requiring FFP and Vit K with improvement of coagulopathy (INR 1.4 this morning). Anticoagulation is on hold until hemoptysis resolves.CT angiogram on 4/7 was negative for pulmonary embolus. Bilateral pneumonia was suspected, requiring IV antibiotics.  Interestingly, diffusely heterogeneous appearance to the visualized bones were seen, more notable than on a prior CT in 2010. Bone scan on 4/8 showed old fractures in posterior 8-9 th ribs, and in a lumbar XR a new lumbar spinal fracture was noted, not picked up by the bone scan. Given the mottled appearance of the CT films,workup for multiple myeloma was initiated: She had anemia in the setting of chronic disease, iron deficiency and blood loss, and mild renal insufficiency was seen with a Cr 1.62. Sodium was normal. Her Calcium however was 12.3 on admission, today at greater than 15 receiving Calcitonin 100U nasal spray, Zometa 4 mg and hydration IV. SPEP / UPEP / IFE are pending. "  Her subsequent history is as detailed below.  INTERVAL HISTORY: [name] returns today accompanied by her husband for followup of her multiple myeloma.  She's currently receiving subcutaneous bortezomib on days 1, 4, 8, and 11 of each 21 day cycle. She is due for day 1 cycle 2 today. She also takes oral dexamethasone, 20 mg every Tuesday and Wednesday.  She receives zoledronic  acid every 4-6 weeks, last given 04/14/2014 and due again today.  Overall, [name] is feeling reasonably well. She does have some increased pain in her left leg, and feels like this has worsened somewhat over the past 2-3 weeks since initiating the bortezomib. She does bruise easily, and has a large bruise on her abdomen from her last injection. Fortunately, however, she has had no additional bleeding, and no epistasis. Her hemoglobin has improved significantly, up from 9.1 on 05/01/2014 to 10.3 today. There had been discussion about starting ESA, but it looks like that is going to be unnecessary. In fact, many of [name]'s labs are looking better. Her platelets are back to normal, from 102 on 05/01/2014 to 333 today.  Her kappa free light chain has improved from 15.1 on 04/28/2014 to 5.91 when drawn on 05/05/2014. This is being repeated today. Certainly this is encouraging.  [name] continues to have high glucose readings, often over 300, especially when on the oral dexamethasone, and there has been some discussion about starting her on insulin injections on those days. Her diabetes is typically treated under the care of Dr. Everlene Farrier.    REVIEW OF SYSTEMS: [name]  continues to feel somewhat weak and tired. She continues to have some pain in her back, and as noted above, in the left lower extremity. She has had no fevers or or chills. She denies any rashes or skin changes and has had no abnormal bleeding. She's eating fairly well, although her appetite is reduced.  She has had no nausea and no change in bowel or bladder habits.  She denies any new cough, phlegm production, increased shortness of breath, chest pain, or palpitations. She has occasional headaches. Her vision is blurred, especially on the oral dexamethasone. She's had no increased peripheral swelling or peripheral neuropathy.   A detailed review of systems is otherwise stable and noncontributory.   PAST MEDICAL HISTORY: Past Medical History    Diagnosis Date  . Neurofibromatosis   . Diabetes mellitus   . HTN (hypertension)   . Obesity   . Osteoarthritis, knee   . Hyperlipidemia     statin intolerant. LDL is 83  . UTI (lower urinary tract infection) 05/29/12  . 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 Pulmonary embolism [id number]    PAST SURGICAL HISTORY: Past Surgical History  Procedure Laterality Date  . Cardiac catheterization    . Tonsillectomy and adenoidectomy    . Clavicle surgery    . Abdominal hysterectomy    . Ankle fracture surgery Right   . Total knee arthroplasty Left 09/15/2013    Procedure: TOTAL KNEE ARTHROPLASTY;  Surgeon: Vickey Huger, MD;  Location: [facility];  Service: Orthopedics;  Laterality: Left;    FAMILY HISTORY Family History  Problem Relation Age of Onset  . Heart disease Mother   . Arthritis Mother   . Stroke Mother   . Heart disease Son     SOCIAL HISTORY:    (reviewed 05/19/2014)  Married. Lives in [place] with husband of 63 years. 2 sons in good health.      ADVANCED DIRECTIVES:    HEALTH MAINTENANCE: (updated 05/19/2014)  History  Substance Use Topics  . Smoking status: Never Smoker   . Smokeless tobacco: Never Used  . Alcohol Use: No     Colonoscopy:  not on file   PAP: not on file   Bone density: not on file   Lipid panel: December 2014   Allergies  Allergen Reactions  . Codeine Nausea And Vomiting    Sees things  . Hydrocodone Nausea And Vomiting    Sees things  . Niaspan [Niacin Er] Nausea Only  . Robaxin [Methocarbamol] Other (See Comments)    Made patient feel funny  . Statins Nausea And Vomiting and Other (See Comments)    Myalgias/leg pain with atorvastatin, rosuvastatin, lovastatin, simvastatin  . Tetracycline Other (See Comments)    Pt doesn't remember reaction  . Zetia [Ezetimibe] Nausea And Vomiting  . Zithromax [Azithromycin] Nausea And Vomiting    Current Outpatient Prescriptions   Medication Sig Dispense Refill  . acetaminophen (TYLENOL) 325 MG tablet Take 1-2 tablets (325-650 mg total) by mouth every 4 (four) hours as needed for mild pain.      Marland Kitchen acyclovir (ZOVIRAX) 400 MG tablet Take 1 tablet (400 mg total) by mouth 2 (two) times daily. Order was written for 5 times daily but Dr. Jana Hakim said it should be bid.  60 tablet  6  . aspirin EC 81 MG EC tablet Take 1 tablet (81 mg total) by mouth daily.  30 tablet  0  . dexamethasone (DECADRON) 4 MG tablet Take 5 tablets (20 mg total) by mouth as directed.  40 tablet  6  . glimepiride (AMARYL) 2 MG tablet Take 2 tablets by mouth every morning with breakfast. If blood sugar is higher than 200, take 1 tablet at night.  90 tablet  1  . glucose blood (ONE TOUCH ULTRA TEST) test strip 1 each by Other route 3 (three) times  daily.  100 each  12  . Lancets MISC Check glucose 3 times daily  100 each  6  . methocarbamol (ROBAXIN) 500 MG tablet Take 500 mg by mouth every 6 (six) hours as needed for muscle spasms.      . polyethylene glycol (MIRALAX / GLYCOLAX) packet Take 17 g by mouth daily.      . traMADol (ULTRAM) 50 MG tablet Take 1 tablet (50 mg total) by mouth every 6 (six) hours as needed for moderate pain.  30 tablet  2  . fluconazole (DIFLUCAN) 100 MG tablet       . metoprolol tartrate (LOPRESSOR) 25 MG tablet Take 25 mg by mouth 2 (two) times daily.      . Multiple Vitamin (MULTIVITAMIN WITH MINERALS) TABS tablet Take 1 tablet by mouth at bedtime. Centrum Silver      . Multiple Vitamins-Minerals (OCUVITE PO) Take 1 tablet by mouth daily.       . nitroGLYCERIN (NITROSTAT) 0.4 MG SL tablet Place 1 tablet (0.4 mg total) under the tongue every 5 (five) minutes as needed for chest pain (up to 3 doses).  25 tablet  3  . nystatin (MYCOSTATIN/NYSTOP) 100000 UNIT/GM POWD Apply 1 g topically 3 (three) times daily.      . prochlorperazine (COMPAZINE) 10 MG tablet Take 1 tablet (10 mg total) by mouth every 6 (six) hours as needed (Nausea or  vomiting).  30 tablet  1  . sennosides-docusate sodium (SENOKOT-S) 8.6-50 MG tablet Take 2 tablets by mouth at bedtime.       No current facility-administered medications for this visit.   Facility-Administered Medications Ordered in Other Visits  Medication Dose Route Frequency Provider Last Rate Last Dose  . 0.45 % sodium chloride infusion   Intravenous Continuous Chauncey Cruel, MD        OBJECTIVE: Middle-aged white woman  who appears tired but is in no acute distress,  evaluated in a wheelchair  Filed Vitals:   05/19/14 1314  BP: 117/76  Pulse: 108  Temp: 97.8 F (36.6 C)  Resp: 18     Body mass index is 31.03 kg/(m^2).    ECOG FS:2 - Symptomatic, <50% confined to bed Filed Weights   05/19/14 1314  Weight: 169 lb 11.2 oz (76.975 kg)   Physical Exam: HEENT:  Sclerae anicteric.  Oropharynx clear and moist. No ulcerations or candidiasis. Neck supple. Trachea midline. NODES:  No cervical or supraclavicular lymphadenopathy palpated.  BREAST EXAM:  Breast exam deferred. LUNGS:  Clear to auscultation bilaterally.  No crackles, wheezes or rhonchi HEART:  Regular rate and rhythm. No murmur  ABDOMEN:  Soft, nontender.  Positive bowel sounds.  EXTREMITIES:  No peripheral edema.   SKIN:  No visible rashes. There are scattered ecchymoses. There is a very large ecchymosis on the right side of the abdomen, status post recent injection. No petechiae noted. No pallor. NEURO:  Nonfocal. Well oriented.  Appropriate affect.     LAB RESULTS:   Lab Results  Component Value Date   WBC 9.2 05/19/2014   NEUTROABS 8.7* 05/19/2014   HGB 10.3* 05/19/2014   HCT 32.5* 05/19/2014   MCV 86.4 05/19/2014   PLT 333 05/19/2014      Chemistry      Component Value Date/Time   NA 136 05/19/2014 1255   NA 134* 04/14/2014 2350   K 4.7 05/19/2014 1255   K 5.1 04/14/2014 2350   CL 96 04/14/2014 2350   CO2 18* 05/19/2014 1255  CO2 20 04/14/2014 2350   BUN 19.0 05/19/2014 1255   BUN 31* 04/14/2014 2350   CREATININE  1.2* 05/19/2014 1255   CREATININE 1.37* 04/14/2014 2350   CREATININE 0.81 12/09/2013 1000   GLU 173 11/08/2010      Component Value Date/Time   CALCIUM 7.9* 05/19/2014 1255   CALCIUM 9.0 04/14/2014 2350   CALCIUM 11.1* 03/25/2014 0850   ALKPHOS 330* 05/19/2014 1255   ALKPHOS 72 04/14/2014 2350   AST 14 05/19/2014 1255   AST 29 04/14/2014 2350   ALT 19 05/19/2014 1255   ALT 10 04/14/2014 2350   BILITOT 0.52 05/19/2014 1255   BILITOT 0.7 04/14/2014 2350       STUDIES:  Dg Bone Survey Met 04/13/2014   CLINICAL DATA:  Multiple myeloma  EXAM: METASTATIC BONE SURVEY  COMPARISON:  Nuclear medicine bone scan March 25, 2014  FINDINGS: Lateral skull: There are multiple subcentimeter lytic lesions throughout the skull consistent with multiple myeloma. Some appears normal.  AP and lateral cervical spine: Bones appear diffusely osteoporotic. There are multiple subtle lytic lesions throughout the cervical spine. No impending fracture. No fracture or spondylolisthesis.  AP and lateral thoracic spine: There are small lytic lesions in the T3, T4, T5, T6, T7, T8, T9, T10, T11, and T12 vertebral bodies, also subcentimeter. Bones are diffusely osteoporotic. There is slight anterior wedging of the T7 vertebral body. There is moderate generalized osteoarthritic change.  AP chest: There is mild atelectatic change in both lower lobes. There is no edema or consolidation. Heart size and pulmonary vascularity are normal. No adenopathy. Bones are somewhat osteoporotic. There is a small lytic lesion in the lateral left seventh rib. There is a small lytic lesion in the anterior right second rib.  AP and lateral lumbar spine: There is significant collapse of the L1 vertebral body. There are equivocal tiny lytic lesions in the L3 vertebral body. There is subtle mottling in the L5 vertebral body concerning for neoplastic involvement.  Pelvis: There are lytic lesions in the right superior pubic ramus. There are lytic lesions in the  intertrochanteric through femur regions bilaterally. There is moderate osteoarthritic change in both hip joints. There is a subtle lytic lesion in the lateral and mid right ischium. There is no fracture or impending fracture.  Right femur/tibia and fibula: Small lytic lesions are identified in the femoral neck region. There is a subtle small lytic lesion in the mid femur region. No fracture or impending fracture.  Left femur/tibia and fibula: There is a total knee replacement. Small lytic lesions are identified in the intertrochanteric region. No other lytic lesions appreciated. Periosteum intact.  Right shoulder, humerus, and forearm: There are subtle lytic lesions throughout the right scapula and proximal right humerus. No fracture or impending fracture.  Left shoulder, humerus, and forearm: There are multiple small lytic lesions in the scapula and proximal humerus. No fracture or impending fracture.  IMPRESSION: Multiple lytic lesions throughout the skeleton consistent with multiple myeloma. No impending fracture apparent. Note that there is that collapse of the L1 vertebral body. Bones are somewhat diffusely osteoporotic. The L5 vertebral body has a somewhat mottled appearance suggesting somewhat more extensive neoplastic involvement at this level compared to other levels.   Electronically Signed   By: Lowella Grip M.D.   On: 04/13/2014 15:02   ASSESSMENT: 68 y.o. [name] woman presenting with hypercalcemia, anemia and mottled bone lesions April 2015, M-spike 3.82 g, IFE showing IgA/kappa myeloma with bone marrow biopsy 03/30/2014 with 88% plasma cells,  FISH  t(4,14), deletion 13 and deletion 11 (intermediate risk); initial beta-2 microglobulin 16.  (1) considered E1A11 study, but unable to tolerate anticoagulation  (2) associated issues:  (a) coagulopathy: history of post-op PE; history of bleeding with warfarin; epistaxis with  ASA 325 ms/ day; switched to 81 mg/ day with fair tolerance  (c)  hypercalcemia: started zolendronate 03/26/2014, to be repeated monthly  (d) pain: compression fracture L1, rib fractures: on tylenol and tramadol  (e) immunocompromise: on acyclovir and Septra prophylaxis  (3) started dexamethasone 40 mg/ week (20 mg po on Tues and Weds April 2505); complicated by hyperglycemia with history of diabetes, typically followed by Dr. Everlene Farrier  (4) started bortezomib SQ 04/28/2014, to be given days 1,4, 8 and 11 of ech 21 day cycle  (5) will start lenalidomide once patient is able to tolerate full dose ASA anticoagulation  PLAN: The majority of our 40 minute appointment today was spent answering Mima's questions, reviewing her recent lab results, discussing her treatment plan, and coordinating care. Dr. Jana Hakim has also reviewed (case, including her labs from today.   She will proceed to treatment today as scheduled for day 1 cycle 2 of subcutaneous bortezomib. She took her oral dexamethasone at home today and she has been instructed, and we'll continue to take 20 mg every Tuesday and Wednesday as before. She will also receive her next dose of zoledronic acid today, and she'll continue to receive this every 4-6 weeks at a dose of 3.5 mg. We will continue to follow her serum creatinine dlosely, but in fact it has actually improved since her last dose of zoledronic acid.  Her calcium is on the low end of normal, and we have advised her to chew TUMS, 2 tablets 3 times daily for at least 4 days following her zoledronic acid today. We will continue to follow her calcium levels closely, as well as her alkaline phosphatase which was also elevated today.  We are referring Everli back to her primary care physician, Dr. Everlene Farrier, for further evaluation of her diabetes. Her glucose has been poorly controlled secondary to her oral dexamethasone, and Dr. Jana Hakim has noted that she may in fact need to be started on insulin on the days she takes dexamethasone.  They'll continue to monitor  her blood sugars closely at home.  As documented in Dr. Virgie Dad previous office note, we are hoping that once we bring the myeloma under better control she will be able to go on aspirin 325 mg daily. At that point we would add the lenalidomide, with the hope of eventually stopping both the dexamethasone and bortezomib, and continuing on lenalidomide maintenance.  I again encouraged Chastelyn to exercise and walk as much as possible, even if it is simply around her house. I'm also hopeful that the pain in her lower back and left leg will improve, but certainly if it worsens she understands to contact us right away.  Both Soundra and her husband voice their understanding and agreement with the above plan. She will call with any changes or problems prior to her next appointment. We are going to follow her rather closely over the next few weeks since she does have so many issues currently going on. Once things stabilize a little more, we will likely begin seeing her only on day 1 of each cycle, and then as needed.  Theotis Burrow, PA-C   05/19/2014 4:16 PM   ADDENDUM: Ellionna is tolerating her treatment well and it is working for her. I  am hoping after a few months we will be able to transition to lenalidomide maintenance. In the meantime her sugars are an issue and I understand she will be working closely with Dr Everlene Farrier re. How to manage them on the days when she receives her dexamethasone.  At this point I am encouraged as is Paediatric nurse. She knows tocall for any problems that may develop before her next visit here.  I personally saw this patient and performed a substantive portion of this encounter with the listed APP documented above.   Chauncey Cruel, MD

## 2014-05-20 ENCOUNTER — Telehealth: Payer: Self-pay | Admitting: Oncology

## 2014-05-20 NOTE — Telephone Encounter (Signed)
pt called to sched 6.26 tx...done pt aware of d.t

## 2014-05-21 ENCOUNTER — Ambulatory Visit (HOSPITAL_BASED_OUTPATIENT_CLINIC_OR_DEPARTMENT_OTHER): Payer: Medicare Other

## 2014-05-21 VITALS — BP 120/75 | HR 64 | Temp 97.6°F | Resp 17

## 2014-05-21 DIAGNOSIS — Z5112 Encounter for antineoplastic immunotherapy: Secondary | ICD-10-CM

## 2014-05-21 DIAGNOSIS — C9000 Multiple myeloma not having achieved remission: Secondary | ICD-10-CM

## 2014-05-21 LAB — IGG, IGA, IGM
IGG (IMMUNOGLOBIN G), SERUM: 254 mg/dL — AB (ref 690–1700)
IgA: 723 mg/dL — ABNORMAL HIGH (ref 69–380)
IgM, Serum: 6 mg/dL — ABNORMAL LOW (ref 52–322)

## 2014-05-21 LAB — PROTEIN ELECTROPHORESIS, SERUM
ALPHA-1-GLOBULIN: 7.8 % — AB (ref 2.9–4.9)
ALPHA-2-GLOBULIN: 14.3 % — AB (ref 7.1–11.8)
Albumin ELP: 49.7 % — ABNORMAL LOW (ref 55.8–66.1)
BETA GLOBULIN: 6.5 % (ref 4.7–7.2)
Beta 2: 18.8 % — ABNORMAL HIGH (ref 3.2–6.5)
GAMMA GLOBULIN: 2.9 % — AB (ref 11.1–18.8)
M-Spike, %: 0.69 g/dL
Total Protein, Serum Electrophoresis: 6.1 g/dL (ref 6.0–8.3)

## 2014-05-21 LAB — KAPPA/LAMBDA LIGHT CHAINS
Kappa free light chain: 3.82 mg/dL — ABNORMAL HIGH (ref 0.33–1.94)
Kappa:Lambda Ratio: 5.54 — ABNORMAL HIGH (ref 0.26–1.65)
Lambda Free Lght Chn: 0.69 mg/dL (ref 0.57–2.63)

## 2014-05-21 MED ORDER — ONDANSETRON HCL 8 MG PO TABS
8.0000 mg | ORAL_TABLET | Freq: Once | ORAL | Status: AC
  Administered 2014-05-21: 8 mg via ORAL

## 2014-05-21 MED ORDER — ONDANSETRON HCL 8 MG PO TABS
ORAL_TABLET | ORAL | Status: AC
  Filled 2014-05-21: qty 1

## 2014-05-21 MED ORDER — BORTEZOMIB CHEMO SQ INJECTION 3.5 MG (2.5MG/ML)
1.3000 mg/m2 | Freq: Once | INTRAMUSCULAR | Status: AC
  Administered 2014-05-21: 2.5 mg via SUBCUTANEOUS
  Filled 2014-05-21: qty 2.5

## 2014-05-21 NOTE — Patient Instructions (Signed)
I made Sue Flow, RN with Dr. Jana Hakim aware of your concerns regarding abdominal gas, and decrease urine output when taking decadron. Per her suggestion, she will let Dr. Jana Hakim know; please try Gas-X over the counter for your gas. Please call us if gas/decreased urine output worsens.   [facility] Discharge Instructions for Patients Receiving Chemotherapy  Today you received the following chemotherapy agents: Velcade  To help prevent nausea and vomiting after your treatment, we encourage you to take your nausea medication: Compazine 10 mg every 6 hrs as needed.    If you develop nausea and vomiting that is not controlled by your nausea medication, call the clinic.   BELOW ARE SYMPTOMS THAT SHOULD BE REPORTED IMMEDIATELY:  *FEVER GREATER THAN 100.5 F  *CHILLS WITH OR WITHOUT FEVER  NAUSEA AND VOMITING THAT IS NOT CONTROLLED WITH YOUR NAUSEA MEDICATION  *UNUSUAL SHORTNESS OF BREATH  *UNUSUAL BRUISING OR BLEEDING  TENDERNESS IN MOUTH AND THROAT WITH OR WITHOUT PRESENCE OF ULCERS  *URINARY PROBLEMS  *BOWEL PROBLEMS  UNUSUAL RASH Items with * indicate a potential emergency and should be followed up as soon as possible.  Feel free to call the clinic you have any questions or concerns. The clinic phone number is [phone].

## 2014-05-21 NOTE — Progress Notes (Signed)
1315- Patient reports gas, abdominal bloating, decreased urine output when taking decadron Tues and Wednes, reports this resolves after a couple of days of taking decadron. Val RN with Dr. Jana Hakim made aware. Per her suggestion, patient to try Gas X over the counter and report any worsening symptoms immediately. Patient and husband voices understanding.

## 2014-05-25 ENCOUNTER — Telehealth: Payer: Self-pay | Admitting: *Deleted

## 2014-05-25 NOTE — Telephone Encounter (Signed)
Received vm call from pt stating that she needed to talk to RN @ things going on this weekend.  Returned call & she states that she had a rough weekend with her stomach feeling bloated & could hardly do anything.  She also felt like her genital area felt swollen since last thurs & had some trouble urinating but she states that she feels better today.  She c/o that she can't taste anything.  She reports that her bowels & bladder are working appropriately right now.  She thinks these are side effects from the 5 pills that she takes on tues & wed.  That is her decadron.  She is receiving velcade & had this & zometa 05/19/14.  Will report to Dr Jana Hakim.

## 2014-05-26 ENCOUNTER — Ambulatory Visit (HOSPITAL_BASED_OUTPATIENT_CLINIC_OR_DEPARTMENT_OTHER): Payer: Medicare Other | Admitting: Oncology

## 2014-05-26 ENCOUNTER — Ambulatory Visit (HOSPITAL_BASED_OUTPATIENT_CLINIC_OR_DEPARTMENT_OTHER): Payer: Medicare Other

## 2014-05-26 ENCOUNTER — Other Ambulatory Visit (HOSPITAL_COMMUNITY): Payer: Self-pay | Admitting: Physical Medicine and Rehabilitation

## 2014-05-26 ENCOUNTER — Other Ambulatory Visit (HOSPITAL_BASED_OUTPATIENT_CLINIC_OR_DEPARTMENT_OTHER): Payer: Medicare Other

## 2014-05-26 ENCOUNTER — Other Ambulatory Visit: Payer: Medicare Other

## 2014-05-26 ENCOUNTER — Encounter: Payer: Self-pay | Admitting: Oncology

## 2014-05-26 VITALS — BP 126/82 | HR 92 | Temp 98.0°F | Resp 20 | Ht 62.0 in | Wt 169.4 lb

## 2014-05-26 DIAGNOSIS — IMO0001 Reserved for inherently not codable concepts without codable children: Secondary | ICD-10-CM

## 2014-05-26 DIAGNOSIS — Z86711 Personal history of pulmonary embolism: Secondary | ICD-10-CM

## 2014-05-26 DIAGNOSIS — Z5112 Encounter for antineoplastic immunotherapy: Secondary | ICD-10-CM

## 2014-05-26 DIAGNOSIS — C9000 Multiple myeloma not having achieved remission: Secondary | ICD-10-CM

## 2014-05-26 DIAGNOSIS — E1165 Type 2 diabetes mellitus with hyperglycemia: Secondary | ICD-10-CM

## 2014-05-26 LAB — COMPREHENSIVE METABOLIC PANEL (CC13)
ALT: 17 U/L (ref 0–55)
AST: 12 U/L (ref 5–34)
Albumin: 2.9 g/dL — ABNORMAL LOW (ref 3.5–5.0)
Alkaline Phosphatase: 343 U/L — ABNORMAL HIGH (ref 40–150)
Anion Gap: 9 mEq/L (ref 3–11)
BUN: 15.1 mg/dL (ref 7.0–26.0)
CALCIUM: 7.2 mg/dL — AB (ref 8.4–10.4)
CO2: 24 meq/L (ref 22–29)
Chloride: 108 mEq/L (ref 98–109)
Creatinine: 1.1 mg/dL (ref 0.6–1.1)
Glucose: 263 mg/dl — ABNORMAL HIGH (ref 70–140)
POTASSIUM: 4.5 meq/L (ref 3.5–5.1)
SODIUM: 141 meq/L (ref 136–145)
TOTAL PROTEIN: 6.1 g/dL — AB (ref 6.4–8.3)
Total Bilirubin: 0.36 mg/dL (ref 0.20–1.20)

## 2014-05-26 LAB — CBC WITH DIFFERENTIAL/PLATELET
BASO%: 0.2 % (ref 0.0–2.0)
BASOS ABS: 0 10*3/uL (ref 0.0–0.1)
EOS ABS: 0 10*3/uL (ref 0.0–0.5)
EOS%: 0.1 % (ref 0.0–7.0)
HCT: 34.7 % — ABNORMAL LOW (ref 34.8–46.6)
HGB: 11 g/dL — ABNORMAL LOW (ref 11.6–15.9)
LYMPH%: 3.1 % — AB (ref 14.0–49.7)
MCH: 27.5 pg (ref 25.1–34.0)
MCHC: 31.7 g/dL (ref 31.5–36.0)
MCV: 86.7 fL (ref 79.5–101.0)
MONO#: 0.3 10*3/uL (ref 0.1–0.9)
MONO%: 2.6 % (ref 0.0–14.0)
NEUT%: 94 % — ABNORMAL HIGH (ref 38.4–76.8)
NEUTROS ABS: 10.9 10*3/uL — AB (ref 1.5–6.5)
Platelets: 249 10*3/uL (ref 145–400)
RBC: 4.01 10*6/uL (ref 3.70–5.45)
RDW: 20.4 % — ABNORMAL HIGH (ref 11.2–14.5)
WBC: 11.6 10*3/uL — AB (ref 3.9–10.3)
lymph#: 0.4 10*3/uL — ABNORMAL LOW (ref 0.9–3.3)

## 2014-05-26 MED ORDER — BORTEZOMIB CHEMO SQ INJECTION 3.5 MG (2.5MG/ML)
1.3000 mg/m2 | Freq: Once | INTRAMUSCULAR | Status: AC
  Administered 2014-05-26: 2.5 mg via SUBCUTANEOUS
  Filled 2014-05-26: qty 2.5

## 2014-05-26 MED ORDER — ONDANSETRON HCL 8 MG PO TABS
ORAL_TABLET | ORAL | Status: AC
  Filled 2014-05-26: qty 1

## 2014-05-26 MED ORDER — ONDANSETRON HCL 8 MG PO TABS
8.0000 mg | ORAL_TABLET | Freq: Once | ORAL | Status: AC
  Administered 2014-05-26: 8 mg via ORAL

## 2014-05-26 NOTE — Patient Instructions (Addendum)
You may use Mucinex 600 mg twice a day as needed for your cough.  You can also use Zyrtec 10 mg at bedtime.  Chew one TUMS daily.

## 2014-05-26 NOTE — Patient Instructions (Signed)
[facility] Discharge Instructions for Patients Receiving Chemotherapy  Today you received the following chemotherapy agents: Velcade.  To help prevent nausea and vomiting after your treatment, we encourage you to take your nausea medication as prescribed.   If you develop nausea and vomiting that is not controlled by your nausea medication, call the clinic.   BELOW ARE SYMPTOMS THAT SHOULD BE REPORTED IMMEDIATELY:  *FEVER GREATER THAN 100.5 F  *CHILLS WITH OR WITHOUT FEVER  NAUSEA AND VOMITING THAT IS NOT CONTROLLED WITH YOUR NAUSEA MEDICATION  *UNUSUAL SHORTNESS OF BREATH  *UNUSUAL BRUISING OR BLEEDING  TENDERNESS IN MOUTH AND THROAT WITH OR WITHOUT PRESENCE OF ULCERS  *URINARY PROBLEMS  *BOWEL PROBLEMS  UNUSUAL RASH Items with * indicate a potential emergency and should be followed up as soon as possible.  Feel free to call the clinic you have any questions or concerns. The clinic phone number is [phone].    

## 2014-05-26 NOTE — Progress Notes (Signed)
[facility]  [phone] [phone]     ID: [name] OB: [birth year]  MR#: [id number]  DZH#:[id number]  PCP: Jenny Reichmann, MD GYN:   SU:  OTHER MD: Jodi Marble, MD;  Darlin Coco, MD  CHIEF COMPLAINT: Multiple myeloma, under active treatment  MYELOMA HISTORY: From the original consult note, dated 03/24/2014:  "The patient has a history of PE following arthroscopic surgery,as well as LLE DVT requiring Coumadin therapy since October of 2014. On 4/7 she presented to the ED with a 2 day history of hemoptysis and mucous material. INR was elevated at 6 requiring FFP and Vit K with improvement of coagulopathy (INR 1.4 this morning). Anticoagulation is on hold until hemoptysis resolves.CT angiogram on 4/7 was negative for pulmonary embolus. Bilateral pneumonia was suspected, requiring IV antibiotics.  Interestingly, diffusely heterogeneous appearance to the visualized bones were seen, more notable than on a prior CT in 2010. Bone scan on 4/8 showed old fractures in posterior 8-9 th ribs, and in a lumbar XR a new lumbar spinal fracture was noted, not picked up by the bone scan. Given the mottled appearance of the CT films,workup for multiple myeloma was initiated: She had anemia in the setting of chronic disease, iron deficiency and blood loss, and mild renal insufficiency was seen with a Cr 1.62. Sodium was normal. Her Calcium however was 12.3 on admission, today at greater than 15 receiving Calcitonin 100U nasal spray, Zometa 4 mg and hydration IV. SPEP / UPEP / IFE are pending. "  Her subsequent history is as detailed below.  INTERVAL HISTORY: [name] returns today accompanied by her husband for followup of her multiple myeloma.  She's currently receiving subcutaneous bortezomib on days 1, 4, 8, and 11 of each 21 day cycle. She is due for day 8 cycle 2 today. She also takes oral dexamethasone, 20 mg every Tuesday and Wednesday.  She receives zoledronic  acid every 4-6 weeks, last given 05/19/14.  Overall, [name] is feeling reasonably well. She does bruise easily, and has a large bruise on her abdomen from her last injection. Fortunately, however, she has had no additional bleeding, and no epistasis. Her hemoglobin has improved significantly, up from 9.1 on 05/01/2014 to 11.0 today. There had been discussion about starting ESA, but it looks like that is going to be unnecessary. In fact, many of [name]'s labs are looking better. Her platelets are back to normal, from 102 on 05/01/2014 to 249 today.  Her kappa free light chain has improved from 15.1 on 04/28/2014 to 3.82 when drawn on 05/19/2014. Certainly this is encouraging.  [name] continues to fluctuating blood sugars. Her diabetes is typically treated under the care of Dr. Everlene Farrier. She is scheduled to see him later this week.   REVIEW OF SYSTEMS: [name]  continues to feel somewhat weak and tired. She continues to have some pain in her back, and as noted above, in the left lower extremity. She has had no fevers or or chills. She denies any rashes or skin changes and has had no abnormal bleeding. She's eating fairly well, although her appetite is reduced.  She has had no nausea and no change in bowel or bladder habits. She denies any new cough, phlegm production, increased shortness of breath, chest pain, or palpitations. She has occasional headaches. Her vision is blurred, especially on the oral dexamethasone. She's had no increased peripheral swelling or peripheral neuropathy.   A detailed review of systems is otherwise stable and noncontributory.   PAST  MEDICAL HISTORY: Past Medical History  Diagnosis Date  . Neurofibromatosis   . Diabetes mellitus   . HTN (hypertension)   . Obesity   . Osteoarthritis, knee   . Hyperlipidemia     statin intolerant. LDL is 83  . UTI (lower urinary tract infection) 05/29/12  . STEMI (ST elevation myocardial infarction)     Inferolateral STEMI 05/29/12 s/p DES to  RCA, nl EF  . Pulmonary embolism [id number]    PAST SURGICAL HISTORY: Past Surgical History  Procedure Laterality Date  . Cardiac catheterization    . Tonsillectomy and adenoidectomy    . Clavicle surgery    . Abdominal hysterectomy    . Ankle fracture surgery Right   . Total knee arthroplasty Left 09/15/2013    Procedure: TOTAL KNEE ARTHROPLASTY;  Surgeon: Vickey Huger, MD;  Location: [facility];  Service: Orthopedics;  Laterality: Left;    FAMILY HISTORY Family History  Problem Relation Age of Onset  . Heart disease Mother   . Arthritis Mother   . Stroke Mother   . Heart disease Son     SOCIAL HISTORY:    (reviewed 05/19/2014)  Married. Lives in [place] with husband of 4 years. 2 sons in good health.      ADVANCED DIRECTIVES:    HEALTH MAINTENANCE: (updated 05/19/2014)  History  Substance Use Topics  . Smoking status: Never Smoker   . Smokeless tobacco: Never Used  . Alcohol Use: No     Colonoscopy:  not on file   PAP: not on file   Bone density: not on file   Lipid panel: December 2014   Allergies  Allergen Reactions  . Codeine Nausea And Vomiting    Sees things  . Hydrocodone Nausea And Vomiting    Sees things  . Niaspan [Niacin Er] Nausea Only  . Robaxin [Methocarbamol] Other (See Comments)    Made patient feel funny  . Statins Nausea And Vomiting and Other (See Comments)    Myalgias/leg pain with atorvastatin, rosuvastatin, lovastatin, simvastatin  . Tetracycline Other (See Comments)    Pt doesn't remember reaction  . Zetia [Ezetimibe] Nausea And Vomiting  . Zithromax [Azithromycin] Nausea And Vomiting    Current Outpatient Prescriptions  Medication Sig Dispense Refill  . acetaminophen (TYLENOL) 325 MG tablet Take 1-2 tablets (325-650 mg total) by mouth every 4 (four) hours as needed for mild pain.      Marland Kitchen acyclovir (ZOVIRAX) 400 MG tablet Take 1 tablet (400 mg total) by mouth 2 (two) times daily. Order was written for 5 times daily but Dr. Jana Hakim  said it should be bid.  60 tablet  6  . aspirin EC 81 MG EC tablet Take 1 tablet (81 mg total) by mouth daily.  30 tablet  0  . dexamethasone (DECADRON) 4 MG tablet Take 5 tablets (20 mg total) by mouth as directed.  40 tablet  6  . fluconazole (DIFLUCAN) 100 MG tablet       . glimepiride (AMARYL) 2 MG tablet Take 2 tablets by mouth every morning with breakfast. If blood sugar is higher than 200, take 1 tablet at night.  90 tablet  1  . glucose blood (ONE TOUCH ULTRA TEST) test strip 1 each by Other route 3 (three) times daily.  100 each  12  . Lancets MISC Check glucose 3 times daily  100 each  6  . methocarbamol (ROBAXIN) 500 MG tablet Take 500 mg by mouth every 6 (six) hours as needed for muscle spasms.      Marland Kitchen  metoprolol tartrate (LOPRESSOR) 25 MG tablet Take 25 mg by mouth 2 (two) times daily.      . Multiple Vitamin (MULTIVITAMIN WITH MINERALS) TABS tablet Take 1 tablet by mouth at bedtime. Centrum Silver      . Multiple Vitamins-Minerals (OCUVITE PO) Take 1 tablet by mouth daily.       . nitroGLYCERIN (NITROSTAT) 0.4 MG SL tablet Place 1 tablet (0.4 mg total) under the tongue every 5 (five) minutes as needed for chest pain (up to 3 doses).  25 tablet  3  . nystatin (MYCOSTATIN/NYSTOP) 100000 UNIT/GM POWD Apply 1 g topically 3 (three) times daily.      . polyethylene glycol (MIRALAX / GLYCOLAX) packet Take 17 g by mouth daily.      . prochlorperazine (COMPAZINE) 10 MG tablet Take 1 tablet (10 mg total) by mouth every 6 (six) hours as needed (Nausea or vomiting).  30 tablet  1  . sennosides-docusate sodium (SENOKOT-S) 8.6-50 MG tablet Take 2 tablets by mouth at bedtime.      . traMADol (ULTRAM) 50 MG tablet Take 1 tablet (50 mg total) by mouth every 6 (six) hours as needed for moderate pain.  30 tablet  2   No current facility-administered medications for this visit.   Facility-Administered Medications Ordered in Other Visits  Medication Dose Route Frequency Provider Last Rate Last Dose  .  0.45 % sodium chloride infusion   Intravenous Continuous Chauncey Cruel, MD        OBJECTIVE: Middle-aged white woman  who appears tired but is in no acute distress,  evaluated in a wheelchair  Filed Vitals:   05/26/14 1138  BP: 126/82  Pulse: 92  Temp: 98 F (36.7 C)  Resp: 20     Body mass index is 30.98 kg/(m^2).    ECOG FS:2 - Symptomatic, <50% confined to bed Filed Weights   05/26/14 1138  Weight: 169 lb 6.4 oz (76.839 kg)   Physical Exam: HEENT:  Sclerae anicteric.  Oropharynx clear and moist. No ulcerations or candidiasis. Neck supple. Trachea midline. NODES:  No cervical or supraclavicular lymphadenopathy palpated.  BREAST EXAM:  Breast exam deferred. LUNGS:  Clear to auscultation bilaterally.  No crackles, wheezes or rhonchi HEART:  Regular rate and rhythm. No murmur  ABDOMEN:  Soft, nontender.  Positive bowel sounds.  EXTREMITIES:  No peripheral edema.   SKIN:  No visible rashes. There are scattered ecchymoses. There is a very large ecchymosis on the right side of the abdomen, status post recent injection. No petechiae noted. No pallor. NEURO:  Nonfocal. Well oriented.  Appropriate affect.     LAB RESULTS:   Lab Results  Component Value Date   WBC 11.6* 05/26/2014   NEUTROABS 10.9* 05/26/2014   HGB 11.0* 05/26/2014   HCT 34.7* 05/26/2014   MCV 86.7 05/26/2014   PLT 249 05/26/2014      Chemistry      Component Value Date/Time   NA 141 05/26/2014 1058   NA 134* 04/14/2014 2350   K 4.5 05/26/2014 1058   K 5.1 04/14/2014 2350   CL 96 04/14/2014 2350   CO2 24 05/26/2014 1058   CO2 20 04/14/2014 2350   BUN 15.1 05/26/2014 1058   BUN 31* 04/14/2014 2350   CREATININE 1.1 05/26/2014 1058   CREATININE 1.37* 04/14/2014 2350   CREATININE 0.81 12/09/2013 1000   GLU 173 11/08/2010      Component Value Date/Time   CALCIUM 7.2* 05/26/2014 1058   CALCIUM 9.0 04/14/2014 2350  CALCIUM 11.1* 03/25/2014 0850   ALKPHOS 343* 05/26/2014 1058   ALKPHOS 72 04/14/2014 2350   AST 12 05/26/2014 1058    AST 29 04/14/2014 2350   ALT 17 05/26/2014 1058   ALT 10 04/14/2014 2350   BILITOT 0.36 05/26/2014 1058   BILITOT 0.7 04/14/2014 2350       STUDIES:  Dg Bone Survey Met 04/13/2014   CLINICAL DATA:  Multiple myeloma  EXAM: METASTATIC BONE SURVEY  COMPARISON:  Nuclear medicine bone scan March 25, 2014  FINDINGS: Lateral skull: There are multiple subcentimeter lytic lesions throughout the skull consistent with multiple myeloma. Some appears normal.  AP and lateral cervical spine: Bones appear diffusely osteoporotic. There are multiple subtle lytic lesions throughout the cervical spine. No impending fracture. No fracture or spondylolisthesis.  AP and lateral thoracic spine: There are small lytic lesions in the T3, T4, T5, T6, T7, T8, T9, T10, T11, and T12 vertebral bodies, also subcentimeter. Bones are diffusely osteoporotic. There is slight anterior wedging of the T7 vertebral body. There is moderate generalized osteoarthritic change.  AP chest: There is mild atelectatic change in both lower lobes. There is no edema or consolidation. Heart size and pulmonary vascularity are normal. No adenopathy. Bones are somewhat osteoporotic. There is a small lytic lesion in the lateral left seventh rib. There is a small lytic lesion in the anterior right second rib.  AP and lateral lumbar spine: There is significant collapse of the L1 vertebral body. There are equivocal tiny lytic lesions in the L3 vertebral body. There is subtle mottling in the L5 vertebral body concerning for neoplastic involvement.  Pelvis: There are lytic lesions in the right superior pubic ramus. There are lytic lesions in the intertrochanteric through femur regions bilaterally. There is moderate osteoarthritic change in both hip joints. There is a subtle lytic lesion in the lateral and mid right ischium. There is no fracture or impending fracture.  Right femur/tibia and fibula: Small lytic lesions are identified in the femoral neck region. There is a  subtle small lytic lesion in the mid femur region. No fracture or impending fracture.  Left femur/tibia and fibula: There is a total knee replacement. Small lytic lesions are identified in the intertrochanteric region. No other lytic lesions appreciated. Periosteum intact.  Right shoulder, humerus, and forearm: There are subtle lytic lesions throughout the right scapula and proximal right humerus. No fracture or impending fracture.  Left shoulder, humerus, and forearm: There are multiple small lytic lesions in the scapula and proximal humerus. No fracture or impending fracture.  IMPRESSION: Multiple lytic lesions throughout the skeleton consistent with multiple myeloma. No impending fracture apparent. Note that there is that collapse of the L1 vertebral body. Bones are somewhat diffusely osteoporotic. The L5 vertebral body has a somewhat mottled appearance suggesting somewhat more extensive neoplastic involvement at this level compared to other levels.   Electronically Signed   By: Lowella Grip M.D.   On: 04/13/2014 15:02   ASSESSMENT: 68 y.o. [place] woman presenting with hypercalcemia, anemia and mottled bone lesions April 2015, M-spike 3.82 g, IFE showing IgA/kappa myeloma with bone marrow biopsy 03/30/2014 with 88% plasma cells, FISH  t(4,14), deletion 13 and deletion 11 (intermediate risk); initial beta-2 microglobulin 16.  (1) considered E1A11 study, but unable to tolerate anticoagulation  (2) associated issues:  (a) coagulopathy: history of post-op PE; history of bleeding with warfarin; epistaxis with  ASA 325 ms/ day; switched to 81 mg/ day with fair tolerance  (c) hypercalcemia: started zolendronate 03/26/2014,  to be repeated monthly  (d) pain: compression fracture L1, rib fractures: on tylenol and tramadol  (e) immunocompromise: on acyclovir and Septra prophylaxis  (3) started dexamethasone 40 mg/ week (20 mg po on Tues and Weds April 8119); complicated by hyperglycemia with history of  diabetes, typically followed by Dr. Everlene Farrier  (4) started bortezomib SQ 04/28/2014, to be given days 1,4, 8 and 11 of ech 21 day cycle  (5) will start lenalidomide once patient is able to tolerate full dose ASA anticoagulation  PLAN: She will proceed to treatment today as scheduled for day 8 cycle 2 of subcutaneous bortezomib. She took her oral dexamethasone at home today and she has been instructed, and we'll continue to take 20 mg every Tuesday and Wednesday as before.   Her calcium is on the low end of normal, and we have advised her to chew TUMS 1 tablet daily. We will continue to follow her calcium levels closely, as well as her alkaline phosphatase which was also elevated today.  As documented in Dr. Virgie Dad previous office note, we are hoping that once we bring the myeloma under better control she will be able to go on aspirin 325 mg daily. At that point we would add the lenalidomide, with the hope of eventually stopping both the dexamethasone and bortezomib, and continuing on lenalidomide maintenance.  I again encouraged Mirissa to exercise and walk as much as possible, even if it is simply around her house. I'm also hopeful that the pain in her lower back and left leg will improve, but certainly if it worsens she understands to contact us right away.  Both [name] and her husband voice their understanding and agreement with the above plan. She will call with any changes or problems prior to her next appointment. We are going to follow her rather closely over the next few weeks since she does have so many issues currently going on. Once things stabilize a little more, we will likely begin seeing her only on day 1 of each cycle, and then as needed.  Maryanna Shape, NP   05/26/2014 12:03 PM

## 2014-05-28 ENCOUNTER — Ambulatory Visit: Payer: Medicare Other | Admitting: Emergency Medicine

## 2014-05-29 ENCOUNTER — Other Ambulatory Visit (HOSPITAL_COMMUNITY): Payer: Self-pay | Admitting: Cardiology

## 2014-05-29 ENCOUNTER — Ambulatory Visit (HOSPITAL_BASED_OUTPATIENT_CLINIC_OR_DEPARTMENT_OTHER): Payer: Medicare Other

## 2014-05-29 VITALS — BP 133/67 | HR 84 | Temp 97.9°F | Resp 17

## 2014-05-29 DIAGNOSIS — C9000 Multiple myeloma not having achieved remission: Secondary | ICD-10-CM

## 2014-05-29 DIAGNOSIS — Z5112 Encounter for antineoplastic immunotherapy: Secondary | ICD-10-CM

## 2014-05-29 MED ORDER — ONDANSETRON HCL 8 MG PO TABS
ORAL_TABLET | ORAL | Status: AC
  Filled 2014-05-29: qty 1

## 2014-05-29 MED ORDER — ONDANSETRON HCL 8 MG PO TABS
8.0000 mg | ORAL_TABLET | Freq: Once | ORAL | Status: AC
  Administered 2014-05-29: 8 mg via ORAL

## 2014-05-29 MED ORDER — BORTEZOMIB CHEMO SQ INJECTION 3.5 MG (2.5MG/ML)
1.3000 mg/m2 | Freq: Once | INTRAMUSCULAR | Status: AC
  Administered 2014-05-29: 2.5 mg via SUBCUTANEOUS
  Filled 2014-05-29: qty 2.5

## 2014-05-29 NOTE — Patient Instructions (Signed)
[facility] Discharge Instructions for Patients Receiving Chemotherapy  Today you received the following chemotherapy agent Velcade injection.  To help prevent nausea and vomiting after your treatment, we encourage you to take your nausea medication.   If you develop nausea and vomiting that is not controlled by your nausea medication, call the clinic.   BELOW ARE SYMPTOMS THAT SHOULD BE REPORTED IMMEDIATELY:  *FEVER GREATER THAN 100.5 F  *CHILLS WITH OR WITHOUT FEVER  NAUSEA AND VOMITING THAT IS NOT CONTROLLED WITH YOUR NAUSEA MEDICATION  *UNUSUAL SHORTNESS OF BREATH  *UNUSUAL BRUISING OR BLEEDING  TENDERNESS IN MOUTH AND THROAT WITH OR WITHOUT PRESENCE OF ULCERS  *URINARY PROBLEMS  *BOWEL PROBLEMS  UNUSUAL RASH Items with * indicate a potential emergency and should be followed up as soon as possible.  Feel free to call the clinic you have any questions or concerns. The clinic phone number is [phone].   Bortezomib injection What is this medicine? BORTEZOMIB (bor TEZ oh mib) is a chemotherapy drug. It slows the growth of cancer cells. This medicine is used to treat multiple myeloma, lymphoma, and other cancers. This medicine may be used for other purposes; ask your health care provider or pharmacist if you have questions. COMMON BRAND NAME(S): Velcade What should I tell my health care provider before I take this medicine? They need to know if you have any of these conditions: -heart disease -irregular heartbeat -liver disease -low blood counts, like low white blood cells, platelets, or hemoglobin -peripheral neuropathy -taking medicine for blood pressure -an unusual or allergic reaction to bortezomib, mannitol, boron, other medicines, foods, dyes, or preservatives -pregnant or trying to get pregnant -breast-feeding How should I use this medicine? This medicine is for injection into a vein or for injection under the skin. It is given by a health  care professional in a hospital or clinic setting. Talk to your pediatrician regarding the use of this medicine in children. Special care may be needed. Overdosage: If you think you have taken too much of this medicine contact a poison control center or emergency room at once. NOTE: This medicine is only for you. Do not share this medicine with others. What if I miss a dose? It is important not to miss your dose. Call your doctor or health care professional if you are unable to keep an appointment. What may interact with this medicine? -medicines for diabetes -medicines to increase blood counts like filgrastim, pegfilgrastim, sargramostim -zalcitabine Talk to your doctor or health care professional before taking any of these medicines: -acetaminophen -aspirin -ibuprofen -ketoprofen -naproxen This list may not describe all possible interactions. Give your health care provider a list of all the medicines, herbs, non-prescription drugs, or dietary supplements you use. Also tell them if you smoke, drink alcohol, or use illegal drugs. Some items may interact with your medicine. What should I watch for while using this medicine? Visit your doctor for checks on your progress. This drug may make you feel generally unwell. This is not uncommon, as chemotherapy can affect healthy cells as well as cancer cells. Report any side effects. Continue your course of treatment even though you feel ill unless your doctor tells you to stop. You may get drowsy or dizzy. Do not drive, use machinery, or do anything that needs mental alertness until you know how this medicine affects you. Do not stand or sit up quickly, especially if you are an older patient. This reduces the risk of dizzy or fainting spells. In some  cases, you may be given additional medicines to help with side effects. Follow all directions for their use. Call your doctor or health care professional for advice if you get a fever, chills or sore  throat, or other symptoms of a cold or flu. Do not treat yourself. This drug decreases your body's ability to fight infections. Try to avoid being around people who are sick. This medicine may increase your risk to bruise or bleed. Call your doctor or health care professional if you notice any unusual bleeding. Be careful brushing and flossing your teeth or using a toothpick because you may get an infection or bleed more easily. If you have any dental work done, tell your dentist you are receiving this medicine. Avoid taking products that contain aspirin, acetaminophen, ibuprofen, naproxen, or ketoprofen unless instructed by your doctor. These medicines may hide a fever. Do not become pregnant while taking this medicine. Women should inform their doctor if they wish to become pregnant or think they might be pregnant. There is a potential for serious side effects to an unborn child. Talk to your health care professional or pharmacist for more information. Do not breast-feed an infant while taking this medicine. You may have vomiting or diarrhea while taking this medicine. Drink water or other fluids as directed. What side effects may I notice from receiving this medicine? Side effects that you should report to your doctor or health care professional as soon as possible: -allergic reactions like skin rash, itching or hives, swelling of the face, lips, or tongue -breathing problems -changes in hearing -changes in vision -fast, irregular heartbeat -feeling faint or lightheaded, falls -pain, tingling, numbness in the hands or feet -seizures -swelling of the ankles, feet, hands -unusual bleeding or bruising -unusually weak or tired -vomiting Side effects that usually do not require medical attention (report to your doctor or health care professional if they continue or are bothersome): -changes in emotions or moods -constipation -diarrhea -loss of appetite -headache -irritation at site where  injected -nausea This list may not describe all possible side effects. Call your doctor for medical advice about side effects. You may report side effects to FDA at 1-800-FDA-1088. Where should I keep my medicine? This drug is given in a hospital or clinic and will not be stored at home. NOTE: This sheet is a summary. It may not cover all possible information. If you have questions about this medicine, talk to your doctor, pharmacist, or health care provider.  2014, [employer]. (2011-01-11 11:42:36)

## 2014-06-01 ENCOUNTER — Encounter: Payer: Self-pay | Admitting: Physician Assistant

## 2014-06-01 ENCOUNTER — Ambulatory Visit (INDEPENDENT_AMBULATORY_CARE_PROVIDER_SITE_OTHER): Payer: Medicare Other | Admitting: Emergency Medicine

## 2014-06-01 ENCOUNTER — Encounter: Payer: Self-pay | Admitting: Emergency Medicine

## 2014-06-01 VITALS — BP 129/79 | HR 89 | Temp 97.8°F | Resp 18 | Ht 62.0 in | Wt 175.6 lb

## 2014-06-01 DIAGNOSIS — M549 Dorsalgia, unspecified: Secondary | ICD-10-CM

## 2014-06-01 DIAGNOSIS — D649 Anemia, unspecified: Secondary | ICD-10-CM

## 2014-06-01 DIAGNOSIS — I2510 Atherosclerotic heart disease of native coronary artery without angina pectoris: Secondary | ICD-10-CM

## 2014-06-01 DIAGNOSIS — R269 Unspecified abnormalities of gait and mobility: Secondary | ICD-10-CM

## 2014-06-01 DIAGNOSIS — I2699 Other pulmonary embolism without acute cor pulmonale: Secondary | ICD-10-CM

## 2014-06-01 DIAGNOSIS — R2681 Unsteadiness on feet: Secondary | ICD-10-CM

## 2014-06-01 DIAGNOSIS — E119 Type 2 diabetes mellitus without complications: Secondary | ICD-10-CM

## 2014-06-01 DIAGNOSIS — C9000 Multiple myeloma not having achieved remission: Secondary | ICD-10-CM

## 2014-06-01 DIAGNOSIS — R040 Epistaxis: Secondary | ICD-10-CM

## 2014-06-01 MED ORDER — GLIMEPIRIDE 2 MG PO TABS: ORAL_TABLET | ORAL | Status: DC

## 2014-06-01 MED ORDER — GLIMEPIRIDE 2 MG PO TABS: ORAL_TABLET | ORAL | Status: AC

## 2014-06-01 NOTE — Progress Notes (Signed)
   Subjective:    Patient ID: [name], female    DOB: 1946/02/04, 68 y.o.   MRN: [id number]  HPI Patient in for followup of her diabetes. When she has her chemotherapy or has to take prednisone related to her chemotherapy her sugars go up. She has also had some random decreases in sugar in the early morning. Overall she is struggling with her chemotherapy. She has been very depressed but does not want to take medication. She prefers to keep her family close and work with her minister. Her back pain has been tolerable. I spoke with the oncology office and it does appear she her myeloma is responding to treatment. At issue is her recurrent nosebleeds. She has not had any further problems with this. She will need to be on a full dose aspirin when she takes Lenalidomide   Review of Systems     Objective:   Physical Exam patient is a tearful young lady in no distress. Her neck is supple. She has a kyphotic curve. Breath sounds are symmetrical. Heart was a regular rate no murmurs. Her extremities show only trace edema. There is minimal tenderness over the lower lumbar spine.        Assessment & Plan:   blood sugar was not done today her last hemoglobin A1c was 6.2. We plan to increase her Amaryl to take 4 mg on a daily basis but to take an extra 2 mg at night on the days she takes prednisone and today she has chemotherapy so on Tuesday Wednesday and Friday she will take an extra dose of Amaryl. She will also have a snack at night to avoid early morning hypoglycemia. She will continue to work with family and her minister regarding her depression.

## 2014-06-01 NOTE — Progress Notes (Signed)
I received a call from Dr. Nena Jordan this morning, the patient's primary care physician. She has had persistently elevated glucose readings secondary to her oral steroids. He has increased her diabetes medications, and has suggested that she eat a snack at bedtime to prevent hypoglycemia. (His office note from today's visit is documented in EPIC.)  We also discussed her recurrent nosebleeds which seem to have resolved at this point.  Her platelets have also normalized. From his standpoint, Dr. Everlene Farrier would be comfortable with [name] resuming a full dose of aspirin, 325 mg daily, at any point.  [name] is scheduled to be seen here next week, and we will continue to follow her very closely. Hopefully, within the next few visits, we will be able to resume her aspirin, at which point we could also consider adding lenalidomide. Our hope is to eventually be able to stop by the dexamethasone and bortezomib, continuing only on lenalidomide maintenance.  Sue Flesher, PA-C 06/01/2014

## 2014-06-01 NOTE — Addendum Note (Signed)
Addended by: Arlyss Queen A on: 06/01/2014 11:21 AM   Modules accepted: Orders

## 2014-06-02 ENCOUNTER — Other Ambulatory Visit: Payer: Self-pay | Admitting: Radiology

## 2014-06-03 ENCOUNTER — Inpatient Hospital Stay: Payer: Medicare Other | Admitting: Physical Medicine & Rehabilitation

## 2014-06-05 ENCOUNTER — Encounter: Payer: Self-pay | Admitting: Podiatry

## 2014-06-05 ENCOUNTER — Ambulatory Visit (INDEPENDENT_AMBULATORY_CARE_PROVIDER_SITE_OTHER): Payer: Medicare Other | Admitting: Podiatry

## 2014-06-05 VITALS — BP 143/73 | HR 92 | Ht 62.0 in | Wt 175.0 lb

## 2014-06-05 DIAGNOSIS — M79606 Pain in leg, unspecified: Secondary | ICD-10-CM

## 2014-06-05 DIAGNOSIS — M79609 Pain in unspecified limb: Secondary | ICD-10-CM

## 2014-06-05 DIAGNOSIS — B351 Tinea unguium: Secondary | ICD-10-CM

## 2014-06-05 NOTE — Patient Instructions (Signed)
Seen for hypertrophic nails. All nails debrided. Return in 3 months or as needed.  

## 2014-06-05 NOTE — Progress Notes (Signed)
Subjective:  68 y.o. year old female patient presents requesting toe nails. Patient walks aided by a walker.  She is currently undergoing a cancer treatment. Her back hurts to bend down.   Objective: Dermatologic: Thick yellow deformed nails x 10.  Vascular: Pedal pulses are all palpable.  Orthopedic: Contracted lesser digits  Neurologic: All epicritic and tactile sensations grossly intact.   Assessment:  Dystrophic mycotic nails x 10.   Treatment: All mycotic nails debrided.  Return in 3 months or as needed.

## 2014-06-09 ENCOUNTER — Ambulatory Visit: Payer: Medicare Other | Admitting: Oncology

## 2014-06-09 ENCOUNTER — Ambulatory Visit (HOSPITAL_BASED_OUTPATIENT_CLINIC_OR_DEPARTMENT_OTHER): Payer: Medicare Other

## 2014-06-09 ENCOUNTER — Other Ambulatory Visit (HOSPITAL_BASED_OUTPATIENT_CLINIC_OR_DEPARTMENT_OTHER): Payer: Medicare Other

## 2014-06-09 ENCOUNTER — Other Ambulatory Visit: Payer: Self-pay | Admitting: Physician Assistant

## 2014-06-09 ENCOUNTER — Ambulatory Visit (HOSPITAL_BASED_OUTPATIENT_CLINIC_OR_DEPARTMENT_OTHER): Payer: Medicare Other | Admitting: Family

## 2014-06-09 VITALS — BP 132/67 | HR 93 | Temp 98.3°F | Resp 18 | Ht 62.0 in | Wt 171.9 lb

## 2014-06-09 DIAGNOSIS — R5383 Other fatigue: Secondary | ICD-10-CM

## 2014-06-09 DIAGNOSIS — IMO0001 Reserved for inherently not codable concepts without codable children: Secondary | ICD-10-CM

## 2014-06-09 DIAGNOSIS — R5381 Other malaise: Secondary | ICD-10-CM

## 2014-06-09 DIAGNOSIS — M549 Dorsalgia, unspecified: Secondary | ICD-10-CM

## 2014-06-09 DIAGNOSIS — E1165 Type 2 diabetes mellitus with hyperglycemia: Secondary | ICD-10-CM

## 2014-06-09 DIAGNOSIS — Z86718 Personal history of other venous thrombosis and embolism: Secondary | ICD-10-CM

## 2014-06-09 DIAGNOSIS — C9000 Multiple myeloma not having achieved remission: Secondary | ICD-10-CM

## 2014-06-09 DIAGNOSIS — E119 Type 2 diabetes mellitus without complications: Secondary | ICD-10-CM

## 2014-06-09 DIAGNOSIS — Z5112 Encounter for antineoplastic immunotherapy: Secondary | ICD-10-CM

## 2014-06-09 LAB — CBC WITH DIFFERENTIAL/PLATELET
BASO%: 0.5 % (ref 0.0–2.0)
BASOS ABS: 0.1 10*3/uL (ref 0.0–0.1)
EOS ABS: 0.1 10*3/uL (ref 0.0–0.5)
EOS%: 0.5 % (ref 0.0–7.0)
HCT: 35.8 % (ref 34.8–46.6)
HEMOGLOBIN: 11.6 g/dL (ref 11.6–15.9)
LYMPH%: 2.5 % — ABNORMAL LOW (ref 14.0–49.7)
MCH: 27.9 pg (ref 25.1–34.0)
MCHC: 32.3 g/dL (ref 31.5–36.0)
MCV: 86.3 fL (ref 79.5–101.0)
MONO#: 0.5 10*3/uL (ref 0.1–0.9)
MONO%: 4.2 % (ref 0.0–14.0)
NEUT%: 92.3 % — ABNORMAL HIGH (ref 38.4–76.8)
NEUTROS ABS: 11.3 10*3/uL — AB (ref 1.5–6.5)
Platelets: 348 10*3/uL (ref 145–400)
RBC: 4.15 10*6/uL (ref 3.70–5.45)
RDW: 19.7 % — AB (ref 11.2–14.5)
WBC: 12.2 10*3/uL — AB (ref 3.9–10.3)
lymph#: 0.3 10*3/uL — ABNORMAL LOW (ref 0.9–3.3)

## 2014-06-09 LAB — COMPREHENSIVE METABOLIC PANEL (CC13)
ALBUMIN: 3 g/dL — AB (ref 3.5–5.0)
ALT: 17 U/L (ref 0–55)
AST: 13 U/L (ref 5–34)
Alkaline Phosphatase: 177 U/L — ABNORMAL HIGH (ref 40–150)
Anion Gap: 9 mEq/L (ref 3–11)
BUN: 14.6 mg/dL (ref 7.0–26.0)
CALCIUM: 8.2 mg/dL — AB (ref 8.4–10.4)
CHLORIDE: 105 meq/L (ref 98–109)
CO2: 24 meq/L (ref 22–29)
Creatinine: 0.9 mg/dL (ref 0.6–1.1)
Glucose: 214 mg/dl — ABNORMAL HIGH (ref 70–140)
POTASSIUM: 4.7 meq/L (ref 3.5–5.1)
Sodium: 138 mEq/L (ref 136–145)
Total Bilirubin: 0.64 mg/dL (ref 0.20–1.20)
Total Protein: 6.2 g/dL — ABNORMAL LOW (ref 6.4–8.3)

## 2014-06-09 MED ORDER — ONDANSETRON HCL 8 MG PO TABS
8.0000 mg | ORAL_TABLET | Freq: Once | ORAL | Status: AC
  Administered 2014-06-09: 8 mg via ORAL

## 2014-06-09 MED ORDER — ONDANSETRON HCL 8 MG PO TABS
ORAL_TABLET | ORAL | Status: AC
  Filled 2014-06-09: qty 1

## 2014-06-09 MED ORDER — BORTEZOMIB CHEMO SQ INJECTION 3.5 MG (2.5MG/ML)
1.3000 mg/m2 | Freq: Once | INTRAMUSCULAR | Status: AC
  Administered 2014-06-09: 2.5 mg via SUBCUTANEOUS
  Filled 2014-06-09: qty 2.5

## 2014-06-09 NOTE — Patient Instructions (Signed)
[facility] Discharge Instructions for Patients Receiving Chemotherapy  Today you received the following chemotherapy agents: Velcade. To help prevent nausea and vomiting after your treatment, we encourage you to take your nausea medication.  If you develop nausea and vomiting that is not controlled by your nausea medication, call the clinic.   BELOW ARE SYMPTOMS THAT SHOULD BE REPORTED IMMEDIATELY:  *FEVER GREATER THAN 100.5 F  *CHILLS WITH OR WITHOUT FEVER  NAUSEA AND VOMITING THAT IS NOT CONTROLLED WITH YOUR NAUSEA MEDICATION  *UNUSUAL SHORTNESS OF BREATH  *UNUSUAL BRUISING OR BLEEDING  TENDERNESS IN MOUTH AND THROAT WITH OR WITHOUT PRESENCE OF ULCERS  *URINARY PROBLEMS  *BOWEL PROBLEMS  UNUSUAL RASH Items with * indicate a potential emergency and should be followed up as soon as possible.  Feel free to call the clinic you have any questions or concerns. The clinic phone number is [phone].    

## 2014-06-09 NOTE — Progress Notes (Signed)
[facility]  [phone] [phone]     ID: [name] OB: 1946/08/08  MR#: [id number]  TDD#:[id number]  PCP: Jenny Reichmann, MD GYN:   SU:  OTHER MD: Jodi Marble, MD;  Darlin Coco, MD  CHIEF COMPLAINT: Multiple myeloma, under active treatment  MYELOMA HISTORY: From the original consult note, dated 03/24/2014:  "The patient has a history of PE following arthroscopic surgery,as well as LLE DVT requiring Coumadin therapy since October of 2014. On 4/7 she presented to the ED with a 2 day history of hemoptysis and mucous material. INR was elevated at 6 requiring FFP and Vit K with improvement of coagulopathy (INR 1.4 this morning). Anticoagulation is on hold until hemoptysis resolves.CT angiogram on 4/7 was negative for pulmonary embolus. Bilateral pneumonia was suspected, requiring IV antibiotics.  Interestingly, diffusely heterogeneous appearance to the visualized bones were seen, more notable than on a prior CT in 2010. Bone scan on 4/8 showed old fractures in posterior 8-9 th ribs, and in a lumbar XR a new lumbar spinal fracture was noted, not picked up by the bone scan. Given the mottled appearance of the CT films,workup for multiple myeloma was initiated: She had anemia in the setting of chronic disease, iron deficiency and blood loss, and mild renal insufficiency was seen with a Cr 1.62. Sodium was normal. Her Calcium however was 12.3 on admission, today at greater than 15 receiving Calcitonin 100U nasal spray, Zometa 4 mg and hydration IV. SPEP / UPEP / IFE are pending. "  Her subsequent history is as detailed below.  INTERVAL HISTORY: [name] returns today accompanied by her husband for followup of her multiple myeloma.  She's currently receiving subcutaneous bortezomib on days 1, 4, 8, and 11 of each 21 day cycle. She is due for day 1 cycle 3 today. She also takes oral dexamethasone, 20 mg every Tuesday and Wednesday.  She receives zoledronic  acid every 4-6 weeks, last given 05/19/14.  Overall, [name] is feeling reasonably well. She does bruise easily, and has a large bruise on her abdomen from her last injection. Fortunately, however, she has had no additional bleeding, and no epistasis. Her hemoglobin has improved significantly, up from 9.1 on 05/01/2014 to 11.6 today. There had been discussion about starting ESA, but it looks like that is going to be unnecessary. In fact, many of [name]'s labs are looking better. Her platelets are back to normal, from 102 on 05/01/2014 to 348 today.  Her kappa free light chain has improved from 15.1 on 04/28/2014 to 3.82 when drawn on 05/19/2014. Certainly this is encouraging.   REVIEW OF SYSTEMS: [name]  continues to feel somewhat weak and tired. She continues to have some pain in her back but states that it is better. She has had no fevers or or chills. She denies any rashes or skin changes and has had no abnormal bleeding. She's eating fairly well, although her appetite is reduced. Her weight remains stable. She has had no nausea and no change in bowel or bladder habits. She denies any new cough, phlegm production, increased shortness of breath, chest pain, or palpitations. She has occasional sinus headaches. Her vision is blurred, especially on the oral dexamethasone. She's had no increased peripheral swelling. She states that she occaisionally has some mild numbness in both her feet but that it is tolerable.   A detailed review of systems is otherwise stable and noncontributory.  PAST MEDICAL HISTORY: Past Medical History  Diagnosis Date  . Neurofibromatosis   .  Diabetes mellitus   . HTN (hypertension)   . Obesity   . Osteoarthritis, knee   . Hyperlipidemia     statin intolerant. LDL is 83  . UTI (lower urinary tract infection) 05/29/12  . 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 Pulmonary embolism [id number]    PAST SURGICAL  HISTORY: Past Surgical History  Procedure Laterality Date  . Cardiac catheterization    . Tonsillectomy and adenoidectomy    . Clavicle surgery    . Abdominal hysterectomy    . Ankle fracture surgery Right   . Total knee arthroplasty Left 09/15/2013    Procedure: TOTAL KNEE ARTHROPLASTY;  Surgeon: Vickey Huger, MD;  Location: [place];  Service: Orthopedics;  Laterality: Left;    FAMILY HISTORY Family History  Problem Relation Age of Onset  . Heart disease Mother   . Arthritis Mother   . Stroke Mother   . Heart disease Son     SOCIAL HISTORY:    (reviewed 05/19/2014)  Married. Lives in [place] with husband of 78 years. 2 sons in good health.      ADVANCED DIRECTIVES:    HEALTH MAINTENANCE: (updated 05/19/2014)  History  Substance Use Topics  . Smoking status: Never Smoker   . Smokeless tobacco: Never Used  . Alcohol Use: No     Colonoscopy:  not on file   PAP: not on file   Bone density: not on file   Lipid panel: December 2014   Allergies  Allergen Reactions  . Codeine Nausea And Vomiting    Sees things  . Hydrocodone Nausea And Vomiting    Sees things  . Niaspan [Niacin Er] Nausea Only  . Robaxin [Methocarbamol] Other (See Comments)    Made patient feel funny  . Statins Nausea And Vomiting and Other (See Comments)    Myalgias/leg pain with atorvastatin, rosuvastatin, lovastatin, simvastatin  . Tetracycline Other (See Comments)    Pt doesn't remember reaction  . Zetia [Ezetimibe] Nausea And Vomiting  . Zithromax [Azithromycin] Nausea And Vomiting    Current Outpatient Prescriptions  Medication Sig Dispense Refill  . acetaminophen (TYLENOL) 325 MG tablet Take 1-2 tablets (325-650 mg total) by mouth every 4 (four) hours as needed for mild pain.      Marland Kitchen acyclovir (ZOVIRAX) 400 MG tablet Take 1 tablet (400 mg total) by mouth 2 (two) times daily. Order was written for 5 times daily but Dr. Jana Hakim said it should be bid.  60 tablet  6  . aspirin EC 81 MG EC  tablet Take 1 tablet (81 mg total) by mouth daily.  30 tablet  0  . dexamethasone (DECADRON) 4 MG tablet Take 5 tablets (20 mg total) by mouth as directed.  40 tablet  6  . fluconazole (DIFLUCAN) 100 MG tablet       . glimepiride (AMARYL) 2 MG tablet Take 2 tablets by mouth every morning with breakfast.  On the days that you take prednisone or have chemotherapy take another dose at the evening meal  90 tablet  1  . glucose blood (ONE TOUCH ULTRA TEST) test strip 1 each by Other route 3 (three) times daily.  100 each  12  . Lancets MISC Check glucose 3 times daily  100 each  6  . methocarbamol (ROBAXIN) 500 MG tablet Take 500 mg by mouth every 6 (six) hours as needed for muscle spasms.      . metoprolol tartrate (  LOPRESSOR) 25 MG tablet Take 25 mg by mouth 2 (two) times daily.      . metoprolol tartrate (LOPRESSOR) 25 MG tablet TAKE 1 TABLET BY MOUTH TWICE DAILY  60 tablet  3  . Multiple Vitamin (MULTIVITAMIN WITH MINERALS) TABS tablet Take 1 tablet by mouth at bedtime. Centrum Silver      . Multiple Vitamins-Minerals (OCUVITE PO) Take 1 tablet by mouth daily.       . nitroGLYCERIN (NITROSTAT) 0.4 MG SL tablet Place 1 tablet (0.4 mg total) under the tongue every 5 (five) minutes as needed for chest pain (up to 3 doses).  25 tablet  3  . nystatin (MYCOSTATIN/NYSTOP) 100000 UNIT/GM POWD Apply 1 g topically 3 (three) times daily.      . polyethylene glycol (MIRALAX / GLYCOLAX) packet Take 17 g by mouth daily.      . prochlorperazine (COMPAZINE) 10 MG tablet Take 1 tablet (10 mg total) by mouth every 6 (six) hours as needed (Nausea or vomiting).  30 tablet  1  . sennosides-docusate sodium (SENOKOT-S) 8.6-50 MG tablet Take 2 tablets by mouth at bedtime.      . traMADol (ULTRAM) 50 MG tablet Take 1 tablet (50 mg total) by mouth every 6 (six) hours as needed for moderate pain.  30 tablet  2   No current facility-administered medications for this visit.   Facility-Administered Medications Ordered in  Other Visits  Medication Dose Route Frequency Provider Last Rate Last Dose  . 0.45 % sodium chloride infusion   Intravenous Continuous Chauncey Cruel, MD        OBJECTIVE: Middle-aged white woman  who appears tired but is in no acute distress,  evaluated in a wheelchair  Filed Vitals:   06/09/14 1043  BP: 132/67  Pulse: 93  Temp: 98.3 F (36.8 C)  Resp: 18     Body mass index is 31.43 kg/(m^2).    ECOG FS:2 - Symptomatic, <50% confined to bed Filed Weights   06/09/14 1043  Weight: 171 lb 14.4 oz (77.973 kg)   Physical Exam: HEENT:  Sclerae anicteric.  Oropharynx clear and moist. No ulcerations or candidiasis. Neck supple. Trachea midline. NODES:  No cervical or supraclavicular lymphadenopathy palpated.  BREAST EXAM:  Breast exam deferred. LUNGS:  Clear to auscultation bilaterally.  No crackles, wheezes or rhonchi HEART:  Regular rate and rhythm. No murmur  ABDOMEN:  Soft, nontender.  Positive bowel sounds.  EXTREMITIES:  No peripheral edema.   SKIN:  No visible rashes. There are scattered ecchymoses. There is a very large ecchymosis on the right side of the abdomen, status post recent injection. No petechiae noted. No pallor. NEURO:  Nonfocal. Well oriented.  Appropriate affect.  LAB RESULTS:  Lab Results  Component Value Date   WBC 12.2* 06/09/2014   NEUTROABS 11.3* 06/09/2014   HGB 11.6 06/09/2014   HCT 35.8 06/09/2014   MCV 86.3 06/09/2014   PLT 348 06/09/2014      Chemistry      Component Value Date/Time   NA 138 06/09/2014 1023   NA 134* 04/14/2014 2350   K 4.7 06/09/2014 1023   K 5.1 04/14/2014 2350   CL 96 04/14/2014 2350   CO2 24 06/09/2014 1023   CO2 20 04/14/2014 2350   BUN 14.6 06/09/2014 1023   BUN 31* 04/14/2014 2350   CREATININE 0.9 06/09/2014 1023   CREATININE 1.37* 04/14/2014 2350   CREATININE 0.81 12/09/2013 1000   GLU 173 11/08/2010      Component  Value Date/Time   CALCIUM 8.2* 06/09/2014 1023   CALCIUM 9.0 04/14/2014 2350   CALCIUM 11.1* 03/25/2014 0850    ALKPHOS 177* 06/09/2014 1023   ALKPHOS 72 04/14/2014 2350   AST 13 06/09/2014 1023   AST 29 04/14/2014 2350   ALT 17 06/09/2014 1023   ALT 10 04/14/2014 2350   BILITOT 0.64 06/09/2014 1023   BILITOT 0.7 04/14/2014 2350       STUDIES:  Dg Bone Survey Met 04/13/2014   CLINICAL DATA:  Multiple myeloma  EXAM: METASTATIC BONE SURVEY  COMPARISON:  Nuclear medicine bone scan March 25, 2014  FINDINGS: Lateral skull: There are multiple subcentimeter lytic lesions throughout the skull consistent with multiple myeloma. Some appears normal.  AP and lateral cervical spine: Bones appear diffusely osteoporotic. There are multiple subtle lytic lesions throughout the cervical spine. No impending fracture. No fracture or spondylolisthesis.  AP and lateral thoracic spine: There are small lytic lesions in the T3, T4, T5, T6, T7, T8, T9, T10, T11, and T12 vertebral bodies, also subcentimeter. Bones are diffusely osteoporotic. There is slight anterior wedging of the T7 vertebral body. There is moderate generalized osteoarthritic change.  AP chest: There is mild atelectatic change in both lower lobes. There is no edema or consolidation. Heart size and pulmonary vascularity are normal. No adenopathy. Bones are somewhat osteoporotic. There is a small lytic lesion in the lateral left seventh rib. There is a small lytic lesion in the anterior right second rib.  AP and lateral lumbar spine: There is significant collapse of the L1 vertebral body. There are equivocal tiny lytic lesions in the L3 vertebral body. There is subtle mottling in the L5 vertebral body concerning for neoplastic involvement.  Pelvis: There are lytic lesions in the right superior pubic ramus. There are lytic lesions in the intertrochanteric through femur regions bilaterally. There is moderate osteoarthritic change in both hip joints. There is a subtle lytic lesion in the lateral and mid right ischium. There is no fracture or impending fracture.  Right femur/tibia  and fibula: Small lytic lesions are identified in the femoral neck region. There is a subtle small lytic lesion in the mid femur region. No fracture or impending fracture.  Left femur/tibia and fibula: There is a total knee replacement. Small lytic lesions are identified in the intertrochanteric region. No other lytic lesions appreciated. Periosteum intact.  Right shoulder, humerus, and forearm: There are subtle lytic lesions throughout the right scapula and proximal right humerus. No fracture or impending fracture.  Left shoulder, humerus, and forearm: There are multiple small lytic lesions in the scapula and proximal humerus. No fracture or impending fracture.  IMPRESSION: Multiple lytic lesions throughout the skeleton consistent with multiple myeloma. No impending fracture apparent. Note that there is that collapse of the L1 vertebral body. Bones are somewhat diffusely osteoporotic. The L5 vertebral body has a somewhat mottled appearance suggesting somewhat more extensive neoplastic involvement at this level compared to other levels.   Electronically Signed   By: Lowella Grip M.D.   On: 04/13/2014 15:02   ASSESSMENT: The patient is a 68 y.o.[place] woman presenting with hypercalcemia, anemia and mottled bone lesions April 2015, M-spike 3.82 g, IFE showing IgA/kappa myeloma with bone marrow biopsy 03/30/2014 with 88% plasma cells, FISH  t(4,14), deletion 13 and deletion 11 (intermediate risk); initial beta-2 microglobulin 16.  (1) considered E1A11 study, but unable to tolerate anticoagulation  (2) associated issues:  (a) coagulopathy: history of post-op PE; history of bleeding with warfarin; epistaxis with  ASA 325 ms/ day; switched to 81 mg/ day with fair tolerance  (c) hypercalcemia: started zolendronate 03/26/2014, to be repeated monthly  (d) pain: compression fracture L1, rib fractures: on tylenol and tramadol  (e) immunocompromise: on acyclovir and Septra prophylaxis  (3) started  dexamethasone 40 mg/ week (20 mg po on Tues and Weds April 6644); complicated by hyperglycemia with history of diabetes, typically followed by Dr. Everlene Farrier  (4) started bortezomib SQ 04/28/2014, to be given days 1,4, 8 and 11 of ech 21 day cycle  (5) will start lenalidomide once patient is able to tolerate full dose ASA anticoagulation  PLAN: The patient will proceed to treatment today as scheduled for day 1 cycle 3 of subcutaneous bortezomib. She took her oral dexamethasone at home today and she has been instructed, and we'll continue to take 20 mg every Tuesday and Wednesday as before.   She was instructed to take OTC Zyrtec or Claritin daily for sinus headache relief.  We discussed her labs in detail.  As documented in Dr. Virgie Dad previous office note, we are hoping that once we bring the myeloma under better control she will be able to go on aspirin 325 mg daily. At that point we would add the lenalidomide, with the hope of eventually stopping both the dexamethasone and bortezomib, and continuing on lenalidomide maintenance.  Both [name] and her husband voice their understanding and agreement with the above plan. She will call with any changes or problems prior to her next appointment. We are going to follow her rather closely over the next few weeks since she does have so many issues currently going on. Once things stabilize a little more, we will likely begin seeing her only on day 1 of each cycle, and then as needed.  Eliezer Bottom, NP   06/09/2014 11:17 AM

## 2014-06-10 ENCOUNTER — Ambulatory Visit: Payer: Medicare Other | Admitting: Podiatry

## 2014-06-11 LAB — PROTEIN ELECTROPHORESIS, SERUM
ALBUMIN ELP: 56.3 % (ref 55.8–66.1)
ALPHA-1-GLOBULIN: 6.9 % — AB (ref 2.9–4.9)
Alpha-2-Globulin: 16.1 % — ABNORMAL HIGH (ref 7.1–11.8)
Beta 2: 8.8 % — ABNORMAL HIGH (ref 3.2–6.5)
Beta Globulin: 7.5 % — ABNORMAL HIGH (ref 4.7–7.2)
Gamma Globulin: 4.4 % — ABNORMAL LOW (ref 11.1–18.8)
M-Spike, %: 0.31 g/dL
Total Protein, Serum Electrophoresis: 6.1 g/dL (ref 6.0–8.3)

## 2014-06-11 LAB — KAPPA/LAMBDA LIGHT CHAINS
KAPPA FREE LGHT CHN: 2.9 mg/dL — AB (ref 0.33–1.94)
Kappa:Lambda Ratio: 18.13 — ABNORMAL HIGH (ref 0.26–1.65)
Lambda Free Lght Chn: 0.16 mg/dL — ABNORMAL LOW (ref 0.57–2.63)

## 2014-06-12 ENCOUNTER — Ambulatory Visit (HOSPITAL_BASED_OUTPATIENT_CLINIC_OR_DEPARTMENT_OTHER): Payer: Medicare Other

## 2014-06-12 VITALS — BP 133/59 | HR 81 | Temp 97.9°F

## 2014-06-12 DIAGNOSIS — C9000 Multiple myeloma not having achieved remission: Secondary | ICD-10-CM

## 2014-06-12 DIAGNOSIS — Z5112 Encounter for antineoplastic immunotherapy: Secondary | ICD-10-CM

## 2014-06-12 MED ORDER — ONDANSETRON HCL 8 MG PO TABS
8.0000 mg | ORAL_TABLET | Freq: Once | ORAL | Status: AC
  Administered 2014-06-12: 8 mg via ORAL

## 2014-06-12 MED ORDER — BORTEZOMIB CHEMO SQ INJECTION 3.5 MG (2.5MG/ML)
1.3000 mg/m2 | Freq: Once | INTRAMUSCULAR | Status: AC
  Administered 2014-06-12: 2.5 mg via SUBCUTANEOUS
  Filled 2014-06-12: qty 2.5

## 2014-06-12 MED ORDER — ONDANSETRON HCL 8 MG PO TABS
ORAL_TABLET | ORAL | Status: AC
  Filled 2014-06-12: qty 1

## 2014-06-12 NOTE — Patient Instructions (Signed)
[facility] Discharge Instructions for Patients Receiving Chemotherapy  Today you received the following chemotherapy agents velcade   To help prevent nausea and vomiting after your treatment, we encourage you to take your nausea medication as directed  If you develop nausea and vomiting that is not controlled by your nausea medication, call the clinic.   BELOW ARE SYMPTOMS THAT SHOULD BE REPORTED IMMEDIATELY:  *FEVER GREATER THAN 100.5 F  *CHILLS WITH OR WITHOUT FEVER  NAUSEA AND VOMITING THAT IS NOT CONTROLLED WITH YOUR NAUSEA MEDICATION  *UNUSUAL SHORTNESS OF BREATH  *UNUSUAL BRUISING OR BLEEDING  TENDERNESS IN MOUTH AND THROAT WITH OR WITHOUT PRESENCE OF ULCERS  *URINARY PROBLEMS  *BOWEL PROBLEMS  UNUSUAL RASH Items with * indicate a potential emergency and should be followed up as soon as possible.  Feel free to call the clinic you have any questions or concerns. The clinic phone number is [phone].  

## 2014-06-16 ENCOUNTER — Telehealth: Payer: Self-pay | Admitting: Adult Health

## 2014-06-16 ENCOUNTER — Ambulatory Visit (HOSPITAL_BASED_OUTPATIENT_CLINIC_OR_DEPARTMENT_OTHER): Payer: Medicare Other

## 2014-06-16 ENCOUNTER — Ambulatory Visit (HOSPITAL_BASED_OUTPATIENT_CLINIC_OR_DEPARTMENT_OTHER): Payer: Medicare Other | Admitting: Adult Health

## 2014-06-16 ENCOUNTER — Encounter: Payer: Self-pay | Admitting: Adult Health

## 2014-06-16 ENCOUNTER — Other Ambulatory Visit (HOSPITAL_BASED_OUTPATIENT_CLINIC_OR_DEPARTMENT_OTHER): Payer: Medicare Other

## 2014-06-16 ENCOUNTER — Telehealth: Payer: Self-pay | Admitting: *Deleted

## 2014-06-16 VITALS — BP 143/86 | HR 92 | Temp 98.5°F | Resp 18 | Ht 62.0 in | Wt 172.2 lb

## 2014-06-16 DIAGNOSIS — C9000 Multiple myeloma not having achieved remission: Secondary | ICD-10-CM

## 2014-06-16 DIAGNOSIS — Z86711 Personal history of pulmonary embolism: Secondary | ICD-10-CM

## 2014-06-16 DIAGNOSIS — Z5112 Encounter for antineoplastic immunotherapy: Secondary | ICD-10-CM

## 2014-06-16 LAB — CBC WITH DIFFERENTIAL/PLATELET
BASO%: 0.2 % (ref 0.0–2.0)
BASOS ABS: 0 10*3/uL (ref 0.0–0.1)
EOS%: 0.1 % (ref 0.0–7.0)
Eosinophils Absolute: 0 10*3/uL (ref 0.0–0.5)
HEMATOCRIT: 38.8 % (ref 34.8–46.6)
HGB: 12.3 g/dL (ref 11.6–15.9)
LYMPH%: 4 % — AB (ref 14.0–49.7)
MCH: 27.5 pg (ref 25.1–34.0)
MCHC: 31.6 g/dL (ref 31.5–36.0)
MCV: 86.9 fL (ref 79.5–101.0)
MONO#: 0.1 10*3/uL (ref 0.1–0.9)
MONO%: 1.2 % (ref 0.0–14.0)
NEUT%: 94.5 % — ABNORMAL HIGH (ref 38.4–76.8)
NEUTROS ABS: 9 10*3/uL — AB (ref 1.5–6.5)
PLATELETS: 287 10*3/uL (ref 145–400)
RBC: 4.47 10*6/uL (ref 3.70–5.45)
RDW: 18.6 % — ABNORMAL HIGH (ref 11.2–14.5)
WBC: 9.5 10*3/uL (ref 3.9–10.3)
lymph#: 0.4 10*3/uL — ABNORMAL LOW (ref 0.9–3.3)

## 2014-06-16 LAB — COMPREHENSIVE METABOLIC PANEL (CC13)
ALBUMIN: 3.1 g/dL — AB (ref 3.5–5.0)
ALK PHOS: 142 U/L (ref 40–150)
ALT: 14 U/L (ref 0–55)
AST: 12 U/L (ref 5–34)
Anion Gap: 10 mEq/L (ref 3–11)
BILIRUBIN TOTAL: 0.37 mg/dL (ref 0.20–1.20)
BUN: 15 mg/dL (ref 7.0–26.0)
CO2: 24 mEq/L (ref 22–29)
CREATININE: 1 mg/dL (ref 0.6–1.1)
Calcium: 8.9 mg/dL (ref 8.4–10.4)
Chloride: 104 mEq/L (ref 98–109)
Glucose: 232 mg/dl — ABNORMAL HIGH (ref 70–140)
POTASSIUM: 4.8 meq/L (ref 3.5–5.1)
Sodium: 138 mEq/L (ref 136–145)
Total Protein: 6.5 g/dL (ref 6.4–8.3)

## 2014-06-16 MED ORDER — ONDANSETRON HCL 8 MG PO TABS
ORAL_TABLET | ORAL | Status: AC
  Filled 2014-06-16: qty 1

## 2014-06-16 MED ORDER — SODIUM CHLORIDE 0.9 % IV SOLN: Freq: Once | INTRAVENOUS | Status: DC

## 2014-06-16 MED ORDER — BORTEZOMIB CHEMO SQ INJECTION 3.5 MG (2.5MG/ML)
1.3000 mg/m2 | Freq: Once | INTRAMUSCULAR | Status: AC
  Administered 2014-06-16: 2.5 mg via SUBCUTANEOUS
  Filled 2014-06-16: qty 2.5

## 2014-06-16 MED ORDER — ZOLEDRONIC ACID 4 MG/5ML IV CONC: 4.0000 mg | Freq: Once | INTRAVENOUS | Status: DC

## 2014-06-16 MED ORDER — ONDANSETRON HCL 8 MG PO TABS
8.0000 mg | ORAL_TABLET | Freq: Once | ORAL | Status: AC
  Administered 2014-06-16: 8 mg via ORAL

## 2014-06-16 NOTE — Telephone Encounter (Signed)
Per staff message and POF I have scheduled appts. Advised scheduler of appts. JMW  

## 2014-06-16 NOTE — Telephone Encounter (Signed)
per pof to sch appts-sent email to MW to sch inj/adv pt to get updated sch when pt comes in 7/2-pt understood

## 2014-06-16 NOTE — Progress Notes (Signed)
[place]  Telephone:(336) [phone] Fax:(336) [phone]     ID: [name] OB: 06-22-1946  MR#: [id number]  ZYS#:[id number]  PCP: Jenny Reichmann, MD GYN:   SU:  OTHER MD: Jodi Marble, MD;  Darlin Coco, MD  CHIEF COMPLAINT: Multiple myeloma, under active treatment  MYELOMA HISTORY: From the original consult note, dated 03/24/2014:  "The patient has a history of PE following arthroscopic surgery,as well as LLE DVT requiring Coumadin therapy since October of 2014. On 4/7 she presented to the ED with a 2 day history of hemoptysis and mucous material. INR was elevated at 6 requiring FFP and Vit K with improvement of coagulopathy (INR 1.4 this morning). Anticoagulation is on hold until hemoptysis resolves.CT angiogram on 4/7 was negative for pulmonary embolus. Bilateral pneumonia was suspected, requiring IV antibiotics.  Interestingly, diffusely heterogeneous appearance to the visualized bones were seen, more notable than on a prior CT in 2010. Bone scan on 4/8 showed old fractures in posterior 8-9 th ribs, and in a lumbar XR a new lumbar spinal fracture was noted, not picked up by the bone scan. Given the mottled appearance of the CT films,workup for multiple myeloma was initiated: She had anemia in the setting of chronic disease, iron deficiency and blood loss, and mild renal insufficiency was seen with a Cr 1.62. Sodium was normal. Her Calcium however was 12.3 on admission, today at greater than 15 receiving Calcitonin 100U nasal spray, Zometa 4 mg and hydration IV. SPEP / UPEP / IFE are pending. "  Her subsequent history is as detailed below.  INTERVAL HISTORY: [name] returns today accompanied by her husband for followup of her multiple myeloma.  She's currently receiving subcutaneous bortezomib on days 1, 4, 8, and 11 of each 21 day cycle. She is due for day 8 cycle 3 today. She also takes oral dexamethasone, 20 mg every Tuesday and Wednesday.  She receives zoledronic  acid every 4-6 weeks, last given 05/19/14.  [name] is doing well today.  She is here for day 8 of cycle three of Bortezomib.  She takes Dexamethasone Tuesdays and Wednesdays with food and tolerates this relatively well.  She has had no further easy bruising or bleeding.  She is taking a baby aspirin daily.  She denies fevers, chills, nausea, vomiting, constipation, diarrhea.  She does have mild numbness which is stable.  She is due for Zometa today.  She is tolerating this well and denies any bone pain, or mouth pain/dental concerns.    REVIEW OF SYSTEMS: A 10 point review of systems was conducted and is otherwise negative except for what is noted above.    PAST MEDICAL HISTORY: Past Medical History  Diagnosis Date  . Neurofibromatosis   . Diabetes mellitus   . HTN (hypertension)   . Obesity   . Osteoarthritis, knee   . Hyperlipidemia     statin intolerant. LDL is 83  . UTI (lower urinary tract infection) 05/29/12  . 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 Pulmonary embolism [id number]    PAST SURGICAL HISTORY: Past Surgical History  Procedure Laterality Date  . Cardiac catheterization    . Tonsillectomy and adenoidectomy    . Clavicle surgery    . Abdominal hysterectomy    . Ankle fracture surgery Right   . Total knee arthroplasty Left 09/15/2013    Procedure: TOTAL KNEE ARTHROPLASTY;  Surgeon: Vickey Huger, MD;  Location: [facility];  Service: Orthopedics;  Laterality: Left;    FAMILY HISTORY Family History  Problem Relation Age of Onset  . Heart disease Mother   . Arthritis Mother   . Stroke Mother   . Heart disease Son     SOCIAL HISTORY:    (reviewed 05/19/2014)  Married. Lives in [place] with husband of 53 years. 2 sons in good health.      ADVANCED DIRECTIVES:    HEALTH MAINTENANCE: (updated 05/19/2014)  History  Substance Use Topics  . Smoking status: Never Smoker   . Smokeless tobacco: Never Used  .  Alcohol Use: No     Colonoscopy:  not on file   PAP: not on file   Bone density: not on file   Lipid panel: December 2014   Allergies  Allergen Reactions  . Codeine Nausea And Vomiting    Sees things  . Hydrocodone Nausea And Vomiting    Sees things  . Niaspan [Niacin Er] Nausea Only  . Robaxin [Methocarbamol] Other (See Comments)    Made patient feel funny  . Statins Nausea And Vomiting and Other (See Comments)    Myalgias/leg pain with atorvastatin, rosuvastatin, lovastatin, simvastatin  . Tetracycline Other (See Comments)    Pt doesn't remember reaction  . Zetia [Ezetimibe] Nausea And Vomiting  . Zithromax [Azithromycin] Nausea And Vomiting    Current Outpatient Prescriptions  Medication Sig Dispense Refill  . acetaminophen (TYLENOL) 325 MG tablet Take 1-2 tablets (325-650 mg total) by mouth every 4 (four) hours as needed for mild pain.      Marland Kitchen acyclovir (ZOVIRAX) 400 MG tablet Take 1 tablet (400 mg total) by mouth 2 (two) times daily. Order was written for 5 times daily but Dr. Jana Hakim said it should be bid.  60 tablet  6  . aspirin EC 81 MG EC tablet Take 1 tablet (81 mg total) by mouth daily.  30 tablet  0  . calcium carbonate (TUMS EX) 750 MG chewable tablet Chew 1 tablet by mouth daily.      . cetirizine (ZYRTEC) 10 MG tablet Take 10 mg by mouth daily.      Marland Kitchen dexamethasone (DECADRON) 4 MG tablet Take 5 tablets (20 mg total) by mouth as directed.  40 tablet  6  . glimepiride (AMARYL) 2 MG tablet Take 2 tablets by mouth every morning with breakfast.  On the days that you take prednisone or have chemotherapy take another dose at the evening meal  90 tablet  1  . glucose blood (ONE TOUCH ULTRA TEST) test strip 1 each by Other route 3 (three) times daily.  100 each  12  . Lancets MISC Check glucose 3 times daily  100 each  6  . metoprolol tartrate (LOPRESSOR) 25 MG tablet Take 25 mg by mouth 2 (two) times daily.      . traMADol (ULTRAM) 50 MG tablet Take 1 tablet (50 mg  total) by mouth every 6 (six) hours as needed for moderate pain.  30 tablet  2  . fluconazole (DIFLUCAN) 100 MG tablet       . methocarbamol (ROBAXIN) 500 MG tablet Take 500 mg by mouth every 6 (six) hours as needed for muscle spasms.      . Multiple Vitamin (MULTIVITAMIN WITH MINERALS) TABS tablet Take 1 tablet by mouth at bedtime. Centrum Silver      . Multiple Vitamins-Minerals (OCUVITE PO) Take 1 tablet by mouth daily.       . nitroGLYCERIN (NITROSTAT) 0.4 MG SL tablet Place  1 tablet (0.4 mg total) under the tongue every 5 (five) minutes as needed for chest pain (up to 3 doses).  25 tablet  3  . nystatin (MYCOSTATIN/NYSTOP) 100000 UNIT/GM POWD Apply 1 g topically 3 (three) times daily.      . polyethylene glycol (MIRALAX / GLYCOLAX) packet Take 17 g by mouth daily.      . prochlorperazine (COMPAZINE) 10 MG tablet Take 1 tablet (10 mg total) by mouth every 6 (six) hours as needed (Nausea or vomiting).  30 tablet  1  . sennosides-docusate sodium (SENOKOT-S) 8.6-50 MG tablet Take 2 tablets by mouth at bedtime.       No current facility-administered medications for this visit.   Facility-Administered Medications Ordered in Other Visits  Medication Dose Route Frequency Provider Last Rate Last Dose  . 0.45 % sodium chloride infusion   Intravenous Continuous Chauncey Cruel, MD        OBJECTIVE: Middle-aged white woman  who appears tired but is in no acute distress,  evaluated in a wheelchair  Filed Vitals:   06/16/14 1341  BP: 143/86  Pulse: 92  Temp: 98.5 F (36.9 C)  Resp: 18     Body mass index is 31.49 kg/(m^2).    ECOG FS:2 - Symptomatic, <50% confined to bed Filed Weights   06/16/14 1341  Weight: 172 lb 3.2 oz (78.109 kg)   Physical Exam: HEENT:  Sclerae anicteric.  Oropharynx clear and moist. No ulcerations or candidiasis. Neck supple. Trachea midline. NODES:  No cervical or supraclavicular lymphadenopathy palpated.  BREAST EXAM:  Breast exam deferred. LUNGS:  Clear to  auscultation bilaterally.  No crackles, wheezes or rhonchi HEART:  Regular rate and rhythm. No murmur  ABDOMEN:  Soft, nontender.  Positive bowel sounds.  EXTREMITIES:  No peripheral edema.   SKIN:  No visible rashes. There are scattered ecchymoses. No lesions. NEURO:  Nonfocal. Well oriented.  Appropriate affect.  LAB RESULTS:  Lab Results  Component Value Date   WBC 9.5 06/16/2014   NEUTROABS 9.0* 06/16/2014   HGB 12.3 06/16/2014   HCT 38.8 06/16/2014   MCV 86.9 06/16/2014   PLT 287 06/16/2014      Chemistry      Component Value Date/Time   NA 138 06/16/2014 1256   NA 134* 04/14/2014 2350   K 4.8 06/16/2014 1256   K 5.1 04/14/2014 2350   CL 96 04/14/2014 2350   CO2 24 06/16/2014 1256   CO2 20 04/14/2014 2350   BUN 15.0 06/16/2014 1256   BUN 31* 04/14/2014 2350   CREATININE 1.0 06/16/2014 1256   CREATININE 1.37* 04/14/2014 2350   CREATININE 0.81 12/09/2013 1000   GLU 173 11/08/2010      Component Value Date/Time   CALCIUM 8.9 06/16/2014 1256   CALCIUM 9.0 04/14/2014 2350   CALCIUM 11.1* 03/25/2014 0850   ALKPHOS 142 06/16/2014 1256   ALKPHOS 72 04/14/2014 2350   AST 12 06/16/2014 1256   AST 29 04/14/2014 2350   ALT 14 06/16/2014 1256   ALT 10 04/14/2014 2350   BILITOT 0.37 06/16/2014 1256   BILITOT 0.7 04/14/2014 2350       STUDIES:  Dg Bone Survey Met 04/13/2014   CLINICAL DATA:  Multiple myeloma  EXAM: METASTATIC BONE SURVEY  COMPARISON:  Nuclear medicine bone scan March 25, 2014  FINDINGS: Lateral skull: There are multiple subcentimeter lytic lesions throughout the skull consistent with multiple myeloma. Some appears normal.  AP and lateral cervical spine: Bones appear diffusely osteoporotic. There  are multiple subtle lytic lesions throughout the cervical spine. No impending fracture. No fracture or spondylolisthesis.  AP and lateral thoracic spine: There are small lytic lesions in the T3, T4, T5, T6, T7, T8, T9, T10, T11, and T12 vertebral bodies, also subcentimeter. Bones are diffusely  osteoporotic. There is slight anterior wedging of the T7 vertebral body. There is moderate generalized osteoarthritic change.  AP chest: There is mild atelectatic change in both lower lobes. There is no edema or consolidation. Heart size and pulmonary vascularity are normal. No adenopathy. Bones are somewhat osteoporotic. There is a small lytic lesion in the lateral left seventh rib. There is a small lytic lesion in the anterior right second rib.  AP and lateral lumbar spine: There is significant collapse of the L1 vertebral body. There are equivocal tiny lytic lesions in the L3 vertebral body. There is subtle mottling in the L5 vertebral body concerning for neoplastic involvement.  Pelvis: There are lytic lesions in the right superior pubic ramus. There are lytic lesions in the intertrochanteric through femur regions bilaterally. There is moderate osteoarthritic change in both hip joints. There is a subtle lytic lesion in the lateral and mid right ischium. There is no fracture or impending fracture.  Right femur/tibia and fibula: Small lytic lesions are identified in the femoral neck region. There is a subtle small lytic lesion in the mid femur region. No fracture or impending fracture.  Left femur/tibia and fibula: There is a total knee replacement. Small lytic lesions are identified in the intertrochanteric region. No other lytic lesions appreciated. Periosteum intact.  Right shoulder, humerus, and forearm: There are subtle lytic lesions throughout the right scapula and proximal right humerus. No fracture or impending fracture.  Left shoulder, humerus, and forearm: There are multiple small lytic lesions in the scapula and proximal humerus. No fracture or impending fracture.  IMPRESSION: Multiple lytic lesions throughout the skeleton consistent with multiple myeloma. No impending fracture apparent. Note that there is that collapse of the L1 vertebral body. Bones are somewhat diffusely osteoporotic. The L5  vertebral body has a somewhat mottled appearance suggesting somewhat more extensive neoplastic involvement at this level compared to other levels.   Electronically Signed   By: Lowella Grip M.D.   On: 04/13/2014 15:02   ASSESSMENT: The patient is a 68 y.o.[place] woman presenting with hypercalcemia, anemia and mottled bone lesions April 2015, M-spike 3.82 g, IFE showing IgA/kappa myeloma with bone marrow biopsy 03/30/2014 with 88% plasma cells, FISH  t(4,14), deletion 13 and deletion 11 (intermediate risk); initial beta-2 microglobulin 16.  (1) considered E1A11 study, but unable to tolerate anticoagulation  (2) associated issues:  (a) coagulopathy: history of post-op PE; history of bleeding with warfarin; epistaxis with  ASA 325 ms/ day; switched to 81 mg/ day with fair tolerance  (c) hypercalcemia: started zolendronate 03/26/2014, to be repeated monthly  (d) pain: compression fracture L1, rib fractures: on tylenol and tramadol  (e) immunocompromise: on acyclovir and Septra prophylaxis  (3) started dexamethasone 40 mg/ week (20 mg po on Tues and Weds April 2671); complicated by hyperglycemia with history of diabetes, typically followed by Dr. Everlene Farrier  (4) started bortezomib SQ 04/28/2014, to be given days 1,4, 8 and 11 of ech 21 day cycle  (5) will start lenalidomide once patient is able to tolerate full dose ASA anticoagulation  PLAN: The patient will proceed to treatment today as scheduled for day 8 cycle 3 of subcutaneous bortezomib. She took her oral dexamethasone at home today and she  has been instructed, and we'll continue to take 20 mg every Tuesday and Wednesday as before.   I reviewed her labs with her in detail.  They remain stable. She is doing well and continues to not have any easy bleeding issues while on the baby Aspirin which is encouraging.  As documented in Dr. Jana Hakim and Amy Berry's previous office note, we are hoping that once we bring the myeloma under better  control she will be able to go on aspirin 325 mg daily. At that point we would add the lenalidomide, with the hope of eventually stopping both the dexamethasone and bortezomib, and continuing on lenalidomide maintenance.  Both [name] and her husband voice their understanding and agreement with the above plan. She will call with any changes or problems prior to her next appointment. We will continue to follow her rather closely over the next few weeks since she does have so many issues currently going on. Once things stabilize a little more, we will likely begin seeing her only on day 1 of each cycle, and then as needed.  I spent 15 minutes counseling the patient face to face.  The total time spent in the appointment was 30 minutes.  Minette Headland, [place] [phone]   06/16/2014 2:03 PM

## 2014-06-16 NOTE — Patient Instructions (Signed)
[facility] Discharge Instructions for Patients Receiving Chemotherapy  Today you received the following chemotherapy agents: Velcade.  To help prevent nausea and vomiting after your treatment, we encourage you to take your nausea medication as prescribed.   If you develop nausea and vomiting that is not controlled by your nausea medication, call the clinic.   BELOW ARE SYMPTOMS THAT SHOULD BE REPORTED IMMEDIATELY:  *FEVER GREATER THAN 100.5 F  *CHILLS WITH OR WITHOUT FEVER  NAUSEA AND VOMITING THAT IS NOT CONTROLLED WITH YOUR NAUSEA MEDICATION  *UNUSUAL SHORTNESS OF BREATH  *UNUSUAL BRUISING OR BLEEDING  TENDERNESS IN MOUTH AND THROAT WITH OR WITHOUT PRESENCE OF ULCERS  *URINARY PROBLEMS  *BOWEL PROBLEMS  UNUSUAL RASH Items with * indicate a potential emergency and should be followed up as soon as possible.  Feel free to call the clinic you have any questions or concerns. The clinic phone number is [phone].    

## 2014-06-18 ENCOUNTER — Telehealth: Payer: Self-pay | Admitting: Adult Health

## 2014-06-18 ENCOUNTER — Ambulatory Visit (HOSPITAL_BASED_OUTPATIENT_CLINIC_OR_DEPARTMENT_OTHER): Payer: Medicare Other

## 2014-06-18 ENCOUNTER — Other Ambulatory Visit: Payer: Self-pay | Admitting: *Deleted

## 2014-06-18 VITALS — BP 121/62 | HR 88 | Temp 98.4°F | Resp 20

## 2014-06-18 DIAGNOSIS — Z5112 Encounter for antineoplastic immunotherapy: Secondary | ICD-10-CM

## 2014-06-18 DIAGNOSIS — C9000 Multiple myeloma not having achieved remission: Secondary | ICD-10-CM

## 2014-06-18 MED ORDER — BORTEZOMIB CHEMO SQ INJECTION 3.5 MG (2.5MG/ML)
1.3000 mg/m2 | Freq: Once | INTRAMUSCULAR | Status: AC
  Administered 2014-06-18: 2.5 mg via SUBCUTANEOUS
  Filled 2014-06-18: qty 2.5

## 2014-06-18 MED ORDER — ONDANSETRON HCL 8 MG PO TABS
ORAL_TABLET | ORAL | Status: AC
  Filled 2014-06-18: qty 1

## 2014-06-18 MED ORDER — ONDANSETRON HCL 8 MG PO TABS
8.0000 mg | ORAL_TABLET | Freq: Once | ORAL | Status: AC
  Administered 2014-06-18: 8 mg via ORAL

## 2014-06-18 MED ORDER — TRAMADOL HCL 50 MG PO TABS: 50.0000 mg | ORAL_TABLET | Freq: Four times a day (QID) | ORAL | Status: DC | PRN

## 2014-06-18 NOTE — Telephone Encounter (Signed)
cld & spoke w/pt to adv I would mail copy or she could get copy today in the trmt room-mailed copy

## 2014-06-18 NOTE — Patient Instructions (Signed)
[facility] Discharge Instructions for Patients Receiving Chemotherapy  Today you received the following chemotherapy agents: Velcade.  To help prevent nausea and vomiting after your treatment, we encourage you to take your nausea medication, Compazine. Take one every six hours as needed.   If you develop nausea and vomiting that is not controlled by your nausea medication, call the clinic.   BELOW ARE SYMPTOMS THAT SHOULD BE REPORTED IMMEDIATELY:  *FEVER GREATER THAN 100.5 F  *CHILLS WITH OR WITHOUT FEVER  NAUSEA AND VOMITING THAT IS NOT CONTROLLED WITH YOUR NAUSEA MEDICATION  *UNUSUAL SHORTNESS OF BREATH  *UNUSUAL BRUISING OR BLEEDING  TENDERNESS IN MOUTH AND THROAT WITH OR WITHOUT PRESENCE OF ULCERS  *URINARY PROBLEMS  *BOWEL PROBLEMS  UNUSUAL RASH Items with * indicate a potential emergency and should be followed up as soon as possible.  Feel free to call the clinic should you have any questions or concerns. The clinic phone number is [phone].

## 2014-06-30 ENCOUNTER — Encounter: Payer: Self-pay | Admitting: Oncology

## 2014-06-30 ENCOUNTER — Ambulatory Visit (HOSPITAL_BASED_OUTPATIENT_CLINIC_OR_DEPARTMENT_OTHER): Payer: Medicare Other

## 2014-06-30 ENCOUNTER — Ambulatory Visit (HOSPITAL_BASED_OUTPATIENT_CLINIC_OR_DEPARTMENT_OTHER): Payer: Medicare Other | Admitting: Oncology

## 2014-06-30 ENCOUNTER — Other Ambulatory Visit (HOSPITAL_BASED_OUTPATIENT_CLINIC_OR_DEPARTMENT_OTHER): Payer: Medicare Other

## 2014-06-30 VITALS — BP 135/74 | HR 87 | Temp 98.3°F | Resp 18 | Ht 62.0 in | Wt 175.8 lb

## 2014-06-30 DIAGNOSIS — C9000 Multiple myeloma not having achieved remission: Secondary | ICD-10-CM

## 2014-06-30 DIAGNOSIS — Z5112 Encounter for antineoplastic immunotherapy: Secondary | ICD-10-CM

## 2014-06-30 LAB — CBC WITH DIFFERENTIAL/PLATELET
BASO%: 0.1 % (ref 0.0–2.0)
Basophils Absolute: 0 10*3/uL (ref 0.0–0.1)
EOS ABS: 0.1 10*3/uL (ref 0.0–0.5)
EOS%: 0.7 % (ref 0.0–7.0)
HCT: 38.1 % (ref 34.8–46.6)
HGB: 12 g/dL (ref 11.6–15.9)
LYMPH%: 3.3 % — AB (ref 14.0–49.7)
MCH: 27.3 pg (ref 25.1–34.0)
MCHC: 31.5 g/dL (ref 31.5–36.0)
MCV: 86.8 fL (ref 79.5–101.0)
MONO#: 0.3 10*3/uL (ref 0.1–0.9)
MONO%: 3 % (ref 0.0–14.0)
NEUT#: 10 10*3/uL — ABNORMAL HIGH (ref 1.5–6.5)
NEUT%: 92.9 % — ABNORMAL HIGH (ref 38.4–76.8)
PLATELETS: 290 10*3/uL (ref 145–400)
RBC: 4.39 10*6/uL (ref 3.70–5.45)
RDW: 16.8 % — ABNORMAL HIGH (ref 11.2–14.5)
WBC: 10.8 10*3/uL — ABNORMAL HIGH (ref 3.9–10.3)
lymph#: 0.4 10*3/uL — ABNORMAL LOW (ref 0.9–3.3)

## 2014-06-30 LAB — COMPREHENSIVE METABOLIC PANEL (CC13)
ALK PHOS: 121 U/L (ref 40–150)
ALT: 14 U/L (ref 0–55)
AST: 12 U/L (ref 5–34)
Albumin: 3.1 g/dL — ABNORMAL LOW (ref 3.5–5.0)
Anion Gap: 9 mEq/L (ref 3–11)
BILIRUBIN TOTAL: 0.43 mg/dL (ref 0.20–1.20)
BUN: 15.5 mg/dL (ref 7.0–26.0)
CO2: 24 mEq/L (ref 22–29)
Calcium: 8.7 mg/dL (ref 8.4–10.4)
Chloride: 106 mEq/L (ref 98–109)
Creatinine: 0.9 mg/dL (ref 0.6–1.1)
GLUCOSE: 241 mg/dL — AB (ref 70–140)
Potassium: 4.7 mEq/L (ref 3.5–5.1)
Sodium: 139 mEq/L (ref 136–145)
TOTAL PROTEIN: 6.1 g/dL — AB (ref 6.4–8.3)

## 2014-06-30 MED ORDER — ONDANSETRON HCL 8 MG PO TABS
ORAL_TABLET | ORAL | Status: AC
  Filled 2014-06-30: qty 1

## 2014-06-30 MED ORDER — BORTEZOMIB CHEMO SQ INJECTION 3.5 MG (2.5MG/ML)
1.3000 mg/m2 | Freq: Once | INTRAMUSCULAR | Status: AC
  Administered 2014-06-30: 2.5 mg via SUBCUTANEOUS
  Filled 2014-06-30: qty 2.5

## 2014-06-30 MED ORDER — ONDANSETRON HCL 8 MG PO TABS
8.0000 mg | ORAL_TABLET | Freq: Once | ORAL | Status: AC
  Administered 2014-06-30: 8 mg via ORAL

## 2014-06-30 MED ORDER — ZOLEDRONIC ACID 4 MG/5ML IV CONC: 3.5000 mg | Freq: Once | INTRAVENOUS | Status: DC

## 2014-06-30 NOTE — Progress Notes (Signed)
[place]  Telephone:(336) [phone] Fax:(336) [phone]     ID: [name] OB: 1946-07-04  MR#: [id number]  ZHY#:[id number]  PCP: Sue Reichmann, MD GYN:   SU:  OTHER MD: Sue Marble, MD;  Sue Coco, MD  CHIEF COMPLAINT: Multiple myeloma, under active treatment  MYELOMA HISTORY: From the original consult note, dated 03/24/2014:  "The patient has a history of PE following arthroscopic surgery,as well as LLE DVT requiring Coumadin therapy since October of 2014. On 4/7 she presented to the ED with a 2 day history of hemoptysis and mucous material. INR was elevated at 6 requiring FFP and Vit K with improvement of coagulopathy (INR 1.4 this morning). Anticoagulation is on hold until hemoptysis resolves.CT angiogram on 4/7 was negative for pulmonary embolus. Bilateral pneumonia was suspected, requiring IV antibiotics.  Interestingly, diffusely heterogeneous appearance to the visualized bones were seen, more notable than on a prior CT in 2010. Bone scan on 4/8 showed old fractures in posterior 8-9 th ribs, and in a lumbar XR a new lumbar spinal fracture was noted, not picked up by the bone scan. Given the mottled appearance of the CT films,workup for multiple myeloma was initiated: She had anemia in the setting of chronic disease, iron deficiency and blood loss, and mild renal insufficiency was seen with a Cr 1.62. Sodium was normal. Her Calcium however was 12.3 on admission, today at greater than 15 receiving Calcitonin 100U nasal spray, Zometa 4 mg and hydration IV. SPEP / UPEP / IFE are pending. "  Her subsequent history is as detailed below.  INTERVAL HISTORY: [name] returns today accompanied by her husband for followup of her multiple myeloma.  She's currently receiving subcutaneous bortezomib on days 1, 4, 8, and 11 of each 21 day cycle. She is due for day 1 cycle 4 today. She also takes oral dexamethasone, 20 mg every Tuesday and Wednesday.  She receives zoledronic  acid every 4-6 weeks, last given 05/19/14.  [name] is doing well today.  She is here for day 1 of cycle four of Bortezomib.  She takes Dexamethasone Tuesdays and Wednesdays with food and tolerates this relatively well.  She has had no further easy bruising or bleeding.  She is taking a baby aspirin daily.  She denies fevers, chills, nausea, vomiting, constipation, diarrhea.  She does have mild numbness which is stable.  She is due for Zometa today.  She is tolerating this well and denies any bone pain, or mouth pain/dental concerns. She states that she is worried about possible starting Revlimid. She has been reading her patient information about this and is not sure she wants to take it. She is also worried about the cost. States that she has read this medication will cost $13000. She is tearful when talking about this today.   REVIEW OF SYSTEMS: A 10 point review of systems was conducted and is otherwise negative except for what is noted above.    PAST MEDICAL HISTORY: Past Medical History  Diagnosis Date  . Neurofibromatosis   . Diabetes mellitus   . HTN (hypertension)   . Obesity   . Osteoarthritis, knee   . Hyperlipidemia     statin intolerant. LDL is 83  . UTI (lower urinary tract infection) 05/29/12  . 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 Pulmonary embolism [id number]    PAST SURGICAL HISTORY: Past Surgical History  Procedure Laterality Date  . Cardiac catheterization    .  Tonsillectomy and adenoidectomy    . Clavicle surgery    . Abdominal hysterectomy    . Ankle fracture surgery Right   . Total knee arthroplasty Left 09/15/2013    Procedure: TOTAL KNEE ARTHROPLASTY;  Surgeon: Sue Huger, MD;  Location: [facility];  Service: Orthopedics;  Laterality: Left;    FAMILY HISTORY Family History  Problem Relation Age of Onset  . Heart disease Mother   . Arthritis Mother   . Stroke Mother   . Heart disease Son      SOCIAL HISTORY:    (reviewed 05/19/2014)  Married. Lives in [place] with husband of 32 years. 2 sons in good health.      ADVANCED DIRECTIVES:    HEALTH MAINTENANCE: (updated 05/19/2014)  History  Substance Use Topics  . Smoking status: Never Smoker   . Smokeless tobacco: Never Used  . Alcohol Use: No     Colonoscopy:  not on file   PAP: not on file   Bone density: not on file   Lipid panel: December 2014   Allergies  Allergen Reactions  . Codeine Nausea And Vomiting    Sees things  . Hydrocodone Nausea And Vomiting    Sees things  . Niaspan [Niacin Er] Nausea Only  . Robaxin [Methocarbamol] Other (See Comments)    Made patient feel funny  . Statins Nausea And Vomiting and Other (See Comments)    Myalgias/leg pain with atorvastatin, rosuvastatin, lovastatin, simvastatin  . Tetracycline Other (See Comments)    Pt doesn't remember reaction  . Zetia [Ezetimibe] Nausea And Vomiting  . Zithromax [Azithromycin] Nausea And Vomiting    Current Outpatient Prescriptions  Medication Sig Dispense Refill  . acetaminophen (TYLENOL) 325 MG tablet Take 1-2 tablets (325-650 mg total) by mouth every 4 (four) hours as needed for mild pain.      Marland Kitchen acyclovir (ZOVIRAX) 400 MG tablet Take 1 tablet (400 mg total) by mouth 2 (two) times daily. Order was written for 5 times daily but Dr. Jana Hakim said it should be bid.  60 tablet  6  . aspirin EC 81 MG EC tablet Take 1 tablet (81 mg total) by mouth daily.  30 tablet  0  . calcium carbonate (TUMS EX) 750 MG chewable tablet Chew 1 tablet by mouth daily.      . cetirizine (ZYRTEC) 10 MG tablet Take 10 mg by mouth daily.      Marland Kitchen dexamethasone (DECADRON) 4 MG tablet Take 5 tablets (20 mg total) by mouth as directed.  40 tablet  6  . glucose blood (ONE TOUCH ULTRA TEST) test strip 1 each by Other route 3 (three) times daily.  100 each  12  . Lancets MISC Check glucose 3 times daily  100 each  6  . methocarbamol (ROBAXIN) 500 MG tablet Take 500  mg by mouth every 6 (six) hours as needed for muscle spasms.      . metoprolol tartrate (LOPRESSOR) 25 MG tablet Take 25 mg by mouth 2 (two) times daily.      . Multiple Vitamin (MULTIVITAMIN WITH MINERALS) TABS tablet Take 1 tablet by mouth at bedtime. Centrum Silver      . Multiple Vitamins-Minerals (OCUVITE PO) Take 1 tablet by mouth daily.       . prochlorperazine (COMPAZINE) 10 MG tablet Take 1 tablet (10 mg total) by mouth every 6 (six) hours as needed (Nausea or vomiting).  30 tablet  1  . traMADol (ULTRAM) 50 MG tablet Take 1 tablet (50 mg  total) by mouth every 6 (six) hours as needed for moderate pain.  30 tablet  2  . fluconazole (DIFLUCAN) 100 MG tablet       . glimepiride (AMARYL) 2 MG tablet Take 2 tablets by mouth every morning with breakfast.  On the days that you take prednisone or have chemotherapy take another dose at the evening meal  90 tablet  1  . nitroGLYCERIN (NITROSTAT) 0.4 MG SL tablet Place 1 tablet (0.4 mg total) under the tongue every 5 (five) minutes as needed for chest pain (up to 3 doses).  25 tablet  3  . nystatin (MYCOSTATIN/NYSTOP) 100000 UNIT/GM POWD Apply 1 g topically 3 (three) times daily.      . polyethylene glycol (MIRALAX / GLYCOLAX) packet Take 17 g by mouth daily.      . sennosides-docusate sodium (SENOKOT-S) 8.6-50 MG tablet Take 2 tablets by mouth at bedtime.       No current facility-administered medications for this visit.   Facility-Administered Medications Ordered in Other Visits  Medication Dose Route Frequency Provider Last Rate Last Dose  . 0.45 % sodium chloride infusion   Intravenous Continuous Chauncey Cruel, MD        OBJECTIVE: Middle-aged white woman  who appears tired but is in no acute distress,  evaluated in a wheelchair  Filed Vitals:   06/30/14 1046  BP: 135/74  Pulse: 87  Temp: 98.3 F (36.8 C)  Resp: 18     Body mass index is 32.15 kg/(m^2).    ECOG FS:2 - Symptomatic, <50% confined to bed Filed Weights   06/30/14  1046  Weight: 175 lb 12.8 oz (79.742 kg)   Physical Exam: HEENT:  Sclerae anicteric.  Oropharynx clear and moist. No ulcerations or candidiasis. Neck supple. Trachea midline. NODES:  No cervical or supraclavicular lymphadenopathy palpated.  BREAST EXAM:  Breast exam deferred. LUNGS:  Clear to auscultation bilaterally.  No crackles, wheezes or rhonchi HEART:  Regular rate and rhythm. No murmur  ABDOMEN:  Soft, nontender.  Positive bowel sounds.  EXTREMITIES:  No peripheral edema.   SKIN:  No visible rashes. There are scattered ecchymoses. No lesions. NEURO:  Nonfocal. Well oriented.  Appropriate affect.  LAB RESULTS:  Lab Results  Component Value Date   WBC 10.8* 06/30/2014   NEUTROABS 10.0* 06/30/2014   HGB 12.0 06/30/2014   HCT 38.1 06/30/2014   MCV 86.8 06/30/2014   PLT 290 06/30/2014      Chemistry      Component Value Date/Time   NA 139 06/30/2014 1032   NA 134* 04/14/2014 2350   K 4.7 06/30/2014 1032   K 5.1 04/14/2014 2350   CL 96 04/14/2014 2350   CO2 24 06/30/2014 1032   CO2 20 04/14/2014 2350   BUN 15.5 06/30/2014 1032   BUN 31* 04/14/2014 2350   CREATININE 0.9 06/30/2014 1032   CREATININE 1.37* 04/14/2014 2350   CREATININE 0.81 12/09/2013 1000   GLU 173 11/08/2010      Component Value Date/Time   CALCIUM 8.7 06/30/2014 1032   CALCIUM 9.0 04/14/2014 2350   CALCIUM 11.1* 03/25/2014 0850   ALKPHOS 121 06/30/2014 1032   ALKPHOS 72 04/14/2014 2350   AST 12 06/30/2014 1032   AST 29 04/14/2014 2350   ALT 14 06/30/2014 1032   ALT 10 04/14/2014 2350   BILITOT 0.43 06/30/2014 1032   BILITOT 0.7 04/14/2014 2350       STUDIES:  Dg Bone Survey Met 04/13/2014   CLINICAL DATA:  Multiple myeloma  EXAM: METASTATIC BONE SURVEY  COMPARISON:  Nuclear medicine bone scan March 25, 2014  FINDINGS: Lateral skull: There are multiple subcentimeter lytic lesions throughout the skull consistent with multiple myeloma. Some appears normal.  AP and lateral cervical spine: Bones appear diffusely  osteoporotic. There are multiple subtle lytic lesions throughout the cervical spine. No impending fracture. No fracture or spondylolisthesis.  AP and lateral thoracic spine: There are small lytic lesions in the T3, T4, T5, T6, T7, T8, T9, T10, T11, and T12 vertebral bodies, also subcentimeter. Bones are diffusely osteoporotic. There is slight anterior wedging of the T7 vertebral body. There is moderate generalized osteoarthritic change.  AP chest: There is mild atelectatic change in both lower lobes. There is no edema or consolidation. Heart size and pulmonary vascularity are normal. No adenopathy. Bones are somewhat osteoporotic. There is a small lytic lesion in the lateral left seventh rib. There is a small lytic lesion in the anterior right second rib.  AP and lateral lumbar spine: There is significant collapse of the L1 vertebral body. There are equivocal tiny lytic lesions in the L3 vertebral body. There is subtle mottling in the L5 vertebral body concerning for neoplastic involvement.  Pelvis: There are lytic lesions in the right superior pubic ramus. There are lytic lesions in the intertrochanteric through femur regions bilaterally. There is moderate osteoarthritic change in both hip joints. There is a subtle lytic lesion in the lateral and mid right ischium. There is no fracture or impending fracture.  Right femur/tibia and fibula: Small lytic lesions are identified in the femoral neck region. There is a subtle small lytic lesion in the mid femur region. No fracture or impending fracture.  Left femur/tibia and fibula: There is a total knee replacement. Small lytic lesions are identified in the intertrochanteric region. No other lytic lesions appreciated. Periosteum intact.  Right shoulder, humerus, and forearm: There are subtle lytic lesions throughout the right scapula and proximal right humerus. No fracture or impending fracture.  Left shoulder, humerus, and forearm: There are multiple small lytic lesions  in the scapula and proximal humerus. No fracture or impending fracture.  IMPRESSION: Multiple lytic lesions throughout the skeleton consistent with multiple myeloma. No impending fracture apparent. Note that there is that collapse of the L1 vertebral body. Bones are somewhat diffusely osteoporotic. The L5 vertebral body has a somewhat mottled appearance suggesting somewhat more extensive neoplastic involvement at this level compared to other levels.   Electronically Signed   By: Bretta Bang M.D.   On: 04/13/2014 15:02   ASSESSMENT: The patient is a 68 y.o.[place] woman presenting with hypercalcemia, anemia and mottled bone lesions April 2015, M-spike 3.82 g, IFE showing IgA/kappa myeloma with bone marrow biopsy 03/30/2014 with 88% plasma cells, FISH  t(4,14), deletion 13 and deletion 11 (intermediate risk); initial beta-2 microglobulin 16.  (1) considered E1A11 study, but unable to tolerate anticoagulation  (2) associated issues:  (a) coagulopathy: history of post-op PE; history of bleeding with warfarin; epistaxis with  ASA 325 ms/ day; switched to 81 mg/ day with fair tolerance  (c) hypercalcemia: started zolendronate 03/26/2014, to be repeated monthly  (d) pain: compression fracture L1, rib fractures: on tylenol and tramadol  (e) immunocompromise: on acyclovir and Septra prophylaxis  (3) started dexamethasone 40 mg/ week (20 mg po on Tues and Weds April 2015); complicated by hyperglycemia with history of diabetes, typically followed by Dr. Cleta Alberts  (4) started bortezomib SQ 04/28/2014, to be given days 1,4, 8 and 11 of  ech 21 day cycle  (5) will start lenalidomide once patient is able to tolerate full dose ASA anticoagulation  PLAN: The patient will proceed to treatment today as scheduled for day 1 cycle 4 of subcutaneous bortezomib. She took her oral dexamethasone at home today and she has been instructed, and we'll continue to take 20 mg every Tuesday and Wednesday as before.   I  reviewed her labs with her in detail.  They remain stable. She is doing well and continues to not have any easy bleeding issues while on the baby Aspirin which is encouraging.  As documented in Dr. Jana Hakim and Amy Berry's previous office note, we are hoping that once we bring the myeloma under better control she will be able to go on aspirin 325 mg daily. At that point we would add the lenalidomide, with the hope of eventually stopping both the dexamethasone and bortezomib, and continuing on lenalidomide maintenance. I have discussed this drug with her today and have reviewed the most common side effects that we see. I have encouraged her to make a list of her questions so that we may address these with her.  Due to her tearfulness today, I have offered support to her and offered a referral to SW or counseling. She has declined. She was also offered a prescription for an antidepressant which she also declined.  I have completed a handicap request form for the DMV today.  Both Kandi and her husband voice their understanding and agreement with the above plan. She will call with any changes or problems prior to her next appointment. We will continue to follow her rather closely over the next few weeks since she does have so many issues currently going on. Once things stabilize a little more, we will likely begin seeing her only on day 1 of each cycle, and then as needed.  I spent 25 minutes counseling the patient face to face.  The total time spent in the appointment was 30 minutes.  Mikey Bussing, DNP, AGPCNP-BC  06/30/2014 4:25 PM

## 2014-06-30 NOTE — Patient Instructions (Signed)
[facility] Discharge Instructions for Patients Receiving Chemotherapy  Today you received the following chemotherapy agents velcade   To help prevent nausea and vomiting after your treatment, we encourage you to take your nausea medication as directed  If you develop nausea and vomiting that is not controlled by your nausea medication, call the clinic.   BELOW ARE SYMPTOMS THAT SHOULD BE REPORTED IMMEDIATELY:  *FEVER GREATER THAN 100.5 F  *CHILLS WITH OR WITHOUT FEVER  NAUSEA AND VOMITING THAT IS NOT CONTROLLED WITH YOUR NAUSEA MEDICATION  *UNUSUAL SHORTNESS OF BREATH  *UNUSUAL BRUISING OR BLEEDING  TENDERNESS IN MOUTH AND THROAT WITH OR WITHOUT PRESENCE OF ULCERS  *URINARY PROBLEMS  *BOWEL PROBLEMS  UNUSUAL RASH Items with * indicate a potential emergency and should be followed up as soon as possible.  Feel free to call the clinic you have any questions or concerns. The clinic phone number is [phone].  

## 2014-07-02 LAB — PROTEIN ELECTROPHORESIS, SERUM
ALPHA-2-GLOBULIN: 15.5 % — AB (ref 7.1–11.8)
Albumin ELP: 57.9 % (ref 55.8–66.1)
Alpha-1-Globulin: 6.1 % — ABNORMAL HIGH (ref 2.9–4.9)
BETA 2: 8.8 % — AB (ref 3.2–6.5)
BETA GLOBULIN: 6.8 % (ref 4.7–7.2)
GAMMA GLOBULIN: 4.9 % — AB (ref 11.1–18.8)
M-SPIKE, %: 0.19 g/dL
Total Protein, Serum Electrophoresis: 5.5 g/dL — ABNORMAL LOW (ref 6.0–8.3)

## 2014-07-02 LAB — KAPPA/LAMBDA LIGHT CHAINS
KAPPA FREE LGHT CHN: 1.67 mg/dL (ref 0.33–1.94)
Kappa:Lambda Ratio: 5.96 — ABNORMAL HIGH (ref 0.26–1.65)
Lambda Free Lght Chn: 0.28 mg/dL — ABNORMAL LOW (ref 0.57–2.63)

## 2014-07-02 LAB — IGG, IGA, IGM
IGA: 209 mg/dL (ref 69–380)
IGM, SERUM: 9 mg/dL — AB (ref 52–322)
IgG (Immunoglobin G), Serum: 267 mg/dL — ABNORMAL LOW (ref 690–1700)

## 2014-07-03 ENCOUNTER — Ambulatory Visit (HOSPITAL_BASED_OUTPATIENT_CLINIC_OR_DEPARTMENT_OTHER): Payer: Medicare Other

## 2014-07-03 VITALS — BP 152/82 | HR 77 | Temp 98.1°F | Resp 18

## 2014-07-03 DIAGNOSIS — Z5112 Encounter for antineoplastic immunotherapy: Secondary | ICD-10-CM

## 2014-07-03 DIAGNOSIS — C9000 Multiple myeloma not having achieved remission: Secondary | ICD-10-CM

## 2014-07-03 MED ORDER — ZOLEDRONIC ACID 4 MG/5ML IV CONC
3.5000 mg | Freq: Once | INTRAVENOUS | Status: AC
  Administered 2014-07-03: 3.5 mg via INTRAVENOUS
  Filled 2014-07-03: qty 4.38

## 2014-07-03 MED ORDER — BORTEZOMIB CHEMO SQ INJECTION 3.5 MG (2.5MG/ML)
1.3000 mg/m2 | Freq: Once | INTRAMUSCULAR | Status: AC
  Administered 2014-07-03: 2.5 mg via SUBCUTANEOUS
  Filled 2014-07-03: qty 2.5

## 2014-07-03 MED ORDER — ONDANSETRON HCL 8 MG PO TABS
ORAL_TABLET | ORAL | Status: AC
  Filled 2014-07-03: qty 1

## 2014-07-03 MED ORDER — SODIUM CHLORIDE 0.9 % IV SOLN
Freq: Once | INTRAVENOUS | Status: AC
  Administered 2014-07-03: 12:00:00 via INTRAVENOUS

## 2014-07-03 MED ORDER — ONDANSETRON HCL 8 MG PO TABS
8.0000 mg | ORAL_TABLET | Freq: Once | ORAL | Status: AC
  Administered 2014-07-03: 8 mg via ORAL

## 2014-07-07 ENCOUNTER — Other Ambulatory Visit (HOSPITAL_BASED_OUTPATIENT_CLINIC_OR_DEPARTMENT_OTHER): Payer: Medicare Other

## 2014-07-07 ENCOUNTER — Ambulatory Visit (HOSPITAL_BASED_OUTPATIENT_CLINIC_OR_DEPARTMENT_OTHER): Payer: Medicare Other | Admitting: Oncology

## 2014-07-07 ENCOUNTER — Telehealth: Payer: Self-pay | Admitting: *Deleted

## 2014-07-07 ENCOUNTER — Telehealth: Payer: Self-pay | Admitting: Oncology

## 2014-07-07 ENCOUNTER — Ambulatory Visit (HOSPITAL_BASED_OUTPATIENT_CLINIC_OR_DEPARTMENT_OTHER): Payer: Medicare Other

## 2014-07-07 VITALS — BP 134/73 | HR 88 | Temp 97.7°F | Resp 20 | Ht 62.0 in | Wt 172.1 lb

## 2014-07-07 DIAGNOSIS — C9000 Multiple myeloma not having achieved remission: Secondary | ICD-10-CM

## 2014-07-07 DIAGNOSIS — IMO0001 Reserved for inherently not codable concepts without codable children: Secondary | ICD-10-CM

## 2014-07-07 DIAGNOSIS — Z5112 Encounter for antineoplastic immunotherapy: Secondary | ICD-10-CM

## 2014-07-07 DIAGNOSIS — E1165 Type 2 diabetes mellitus with hyperglycemia: Secondary | ICD-10-CM

## 2014-07-07 LAB — CBC WITH DIFFERENTIAL/PLATELET
BASO%: 0.2 % (ref 0.0–2.0)
Basophils Absolute: 0 10*3/uL (ref 0.0–0.1)
EOS ABS: 0.1 10*3/uL (ref 0.0–0.5)
EOS%: 0.9 % (ref 0.0–7.0)
HCT: 40.4 % (ref 34.8–46.6)
HGB: 12.8 g/dL (ref 11.6–15.9)
LYMPH%: 5.6 % — AB (ref 14.0–49.7)
MCH: 27.5 pg (ref 25.1–34.0)
MCHC: 31.8 g/dL (ref 31.5–36.0)
MCV: 86.4 fL (ref 79.5–101.0)
MONO#: 1.1 10*3/uL — ABNORMAL HIGH (ref 0.1–0.9)
MONO%: 9.8 % (ref 0.0–14.0)
NEUT%: 83.5 % — ABNORMAL HIGH (ref 38.4–76.8)
NEUTROS ABS: 9.2 10*3/uL — AB (ref 1.5–6.5)
PLATELETS: 212 10*3/uL (ref 145–400)
RBC: 4.67 10*6/uL (ref 3.70–5.45)
RDW: 17.5 % — ABNORMAL HIGH (ref 11.2–14.5)
WBC: 11 10*3/uL — ABNORMAL HIGH (ref 3.9–10.3)
lymph#: 0.6 10*3/uL — ABNORMAL LOW (ref 0.9–3.3)

## 2014-07-07 LAB — COMPREHENSIVE METABOLIC PANEL (CC13)
ALK PHOS: 114 U/L (ref 40–150)
ALT: 13 U/L (ref 0–55)
AST: 12 U/L (ref 5–34)
Albumin: 2.9 g/dL — ABNORMAL LOW (ref 3.5–5.0)
Anion Gap: 10 mEq/L (ref 3–11)
BILIRUBIN TOTAL: 0.46 mg/dL (ref 0.20–1.20)
BUN: 21 mg/dL (ref 7.0–26.0)
CO2: 26 mEq/L (ref 22–29)
Calcium: 8.8 mg/dL (ref 8.4–10.4)
Chloride: 104 mEq/L (ref 98–109)
Creatinine: 1.2 mg/dL — ABNORMAL HIGH (ref 0.6–1.1)
Glucose: 287 mg/dl — ABNORMAL HIGH (ref 70–140)
Potassium: 4.5 mEq/L (ref 3.5–5.1)
SODIUM: 139 meq/L (ref 136–145)
Total Protein: 6 g/dL — ABNORMAL LOW (ref 6.4–8.3)

## 2014-07-07 MED ORDER — ONDANSETRON HCL 8 MG PO TABS
ORAL_TABLET | ORAL | Status: AC
  Filled 2014-07-07: qty 1

## 2014-07-07 MED ORDER — TOBRAMYCIN-DEXAMETHASONE 0.3-0.1 % OP SUSP: 1.0000 [drp] | OPHTHALMIC | Status: DC

## 2014-07-07 MED ORDER — ASPIRIN 81 MG PO TBEC: 325.0000 mg | DELAYED_RELEASE_TABLET | Freq: Every day | ORAL | Status: DC

## 2014-07-07 MED ORDER — ONDANSETRON HCL 8 MG PO TABS
8.0000 mg | ORAL_TABLET | Freq: Once | ORAL | Status: AC
  Administered 2014-07-07: 8 mg via ORAL

## 2014-07-07 MED ORDER — BORTEZOMIB CHEMO SQ INJECTION 3.5 MG (2.5MG/ML)
1.3000 mg/m2 | Freq: Once | INTRAMUSCULAR | Status: AC
  Administered 2014-07-07: 2.5 mg via SUBCUTANEOUS
  Filled 2014-07-07: qty 2.5

## 2014-07-07 NOTE — Patient Instructions (Addendum)
[facility] Discharge Instructions for Patients Receiving Chemotherapy  Today you received the following chemotherapy agents velcade.  To help prevent nausea and vomiting after your treatment, we encourage you to take your nausea medication compazine 10 mg every 6-8 hours as needed.   If you develop nausea and vomiting that is not controlled by your nausea medication, call the clinic.   BELOW ARE SYMPTOMS THAT SHOULD BE REPORTED IMMEDIATELY:  *FEVER GREATER THAN 100.5 F  *CHILLS WITH OR WITHOUT FEVER  NAUSEA AND VOMITING THAT IS NOT CONTROLLED WITH YOUR NAUSEA MEDICATION  *UNUSUAL SHORTNESS OF BREATH  *UNUSUAL BRUISING OR BLEEDING  TENDERNESS IN MOUTH AND THROAT WITH OR WITHOUT PRESENCE OF ULCERS  *URINARY PROBLEMS  *BOWEL PROBLEMS  UNUSUAL RASH Items with * indicate a potential emergency and should be followed up as soon as possible.  Feel free to call the clinic you have any questions or concerns. The clinic phone number is [phone].

## 2014-07-07 NOTE — Telephone Encounter (Signed)
per pof to override 8:30 appt w/GM on 8/28-TM sch-sent LaKeesha an email to sch inj in trmt room-adv pt of appt on 7/24 and pt agred to pick up sch on 7/24

## 2014-07-07 NOTE — Telephone Encounter (Signed)
Per [facility] staff message scheduled appts. Advised scheduler 

## 2014-07-07 NOTE — Progress Notes (Signed)
[facility]  [phone] [phone]     ID: [name] OB: 07-Apr-1946  MR#: [id number]  XOV#:[id number]  PCP: Jenny Reichmann, MD GYN:   SU:  OTHER MD: Jodi Marble, MD;  Darlin Coco, MD  CHIEF COMPLAINT: Multiple myeloma, under active treatment  MYELOMA HISTORY: From the original consult note, dated 03/24/2014:  "The patient has a history of PE following arthroscopic surgery,as well as LLE DVT requiring Coumadin therapy since October of 2014. On 4/7 she presented to the ED with a 2 day history of hemoptysis and mucous material. INR was elevated at 6 requiring FFP and Vit K with improvement of coagulopathy (INR 1.4 this morning). Anticoagulation is on hold until hemoptysis resolves.CT angiogram on 4/7 was negative for pulmonary embolus. Bilateral pneumonia was suspected, requiring IV antibiotics.  Interestingly, diffusely heterogeneous appearance to the visualized bones were seen, more notable than on a prior CT in 2010. Bone scan on 4/8 showed old fractures in posterior 8-9 th ribs, and in a lumbar XR a new lumbar spinal fracture was noted, not picked up by the bone scan. Given the mottled appearance of the CT films,workup for multiple myeloma was initiated: She had anemia in the setting of chronic disease, iron deficiency and blood loss, and mild renal insufficiency was seen with a Cr 1.62. Sodium was normal. Her Calcium however was 12.3 on admission, today at greater than 15 receiving Calcitonin 100U nasal spray, Zometa 4 mg and hydration IV. SPEP / UPEP / IFE are pending. "  Her subsequent history is as detailed below.  INTERVAL HISTORY: [name] returns today accompanied by her husband for followup of her multiple myeloma.  Today is day 8 cycle 4 of her subcutaneous bortezomib which she receives on days 1, 4, 8, and 11 of each 21 day cycle. She also takes oral dexamethasone, 20 mg every Tuesday and Wednesday.  She has been on zoledronic acid  every 4-6 weeks, last given 07/03/2014  REVIEW OF SYSTEMS: [name] is tolerating treatment remarkably well. She complains of chills, which she has had for about 2 weeks. There has not been any fever, or rash. Importantly there has not been any evidence of epistaxis now for almost 2 months. Her right eyes little runny and "sticky" in the morning. She has mild sinus problems in general. Occasionally her ankles swell. She is short of breath with activity and she is not exercising on a regular basis. Her appetite is poor. He is getting a little bit constipated. When she takes the dexamethasone her glucose rises to 300 or 400. She takes extra oral hypoglycemics at that time. A detailed review of systems today was otherwise noncontributory   PAST MEDICAL HISTORY: Past Medical History  Diagnosis Date  . Neurofibromatosis   . Diabetes mellitus   . HTN (hypertension)   . Obesity   . Osteoarthritis, knee   . Hyperlipidemia     statin intolerant. LDL is 83  . UTI (lower urinary tract infection) 05/29/12  . 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 Pulmonary embolism [id number]    PAST SURGICAL HISTORY: Past Surgical History  Procedure Laterality Date  . Cardiac catheterization    . Tonsillectomy and adenoidectomy    . Clavicle surgery    . Abdominal hysterectomy    . Ankle fracture surgery Right   . Total knee arthroplasty Left 09/15/2013    Procedure: TOTAL KNEE ARTHROPLASTY;  Surgeon: Richardson Landry  Ronnie Derby, MD;  Location: [facility];  Service: Orthopedics;  Laterality: Left;    FAMILY HISTORY Family History  Problem Relation Age of Onset  . Heart disease Mother   . Arthritis Mother   . Stroke Mother   . Heart disease Son     SOCIAL HISTORY:    (reviewed 05/19/2014)  Married. Lives in [place] with husband of 60 years. 2 sons in good health.      ADVANCED DIRECTIVES:    HEALTH MAINTENANCE: (updated 05/19/2014)  History  Substance Use  Topics  . Smoking status: Never Smoker   . Smokeless tobacco: Never Used  . Alcohol Use: No     Colonoscopy:  not on file   PAP: not on file   Bone density: not on file   Lipid panel: December 2014   Allergies  Allergen Reactions  . Codeine Nausea And Vomiting    Sees things  . Hydrocodone Nausea And Vomiting    Sees things  . Niaspan [Niacin Er] Nausea Only  . Robaxin [Methocarbamol] Other (See Comments)    Made patient feel funny  . Statins Nausea And Vomiting and Other (See Comments)    Myalgias/leg pain with atorvastatin, rosuvastatin, lovastatin, simvastatin  . Tetracycline Other (See Comments)    Pt doesn't remember reaction  . Zetia [Ezetimibe] Nausea And Vomiting  . Zithromax [Azithromycin] Nausea And Vomiting    Current Outpatient Prescriptions  Medication Sig Dispense Refill  . acetaminophen (TYLENOL) 325 MG tablet Take 1-2 tablets (325-650 mg total) by mouth every 4 (four) hours as needed for mild pain.      Marland Kitchen acyclovir (ZOVIRAX) 400 MG tablet Take 1 tablet (400 mg total) by mouth 2 (two) times daily. Order was written for 5 times daily but Dr. Jana Hakim said it should be bid.  60 tablet  6  . aspirin EC 81 MG EC tablet Take 1 tablet (81 mg total) by mouth daily.  30 tablet  0  . calcium carbonate (TUMS EX) 750 MG chewable tablet Chew 1 tablet by mouth daily.      . cetirizine (ZYRTEC) 10 MG tablet Take 10 mg by mouth daily.      Marland Kitchen dexamethasone (DECADRON) 4 MG tablet Take 5 tablets (20 mg total) by mouth as directed.  40 tablet  6  . fluconazole (DIFLUCAN) 100 MG tablet       . glimepiride (AMARYL) 2 MG tablet Take 2 tablets by mouth every morning with breakfast.  On the days that you take prednisone or have chemotherapy take another dose at the evening meal  90 tablet  1  . glucose blood (ONE TOUCH ULTRA TEST) test strip 1 each by Other route 3 (three) times daily.  100 each  12  . Lancets MISC Check glucose 3 times daily  100 each  6  . methocarbamol (ROBAXIN)  500 MG tablet Take 500 mg by mouth every 6 (six) hours as needed for muscle spasms.      . metoprolol tartrate (LOPRESSOR) 25 MG tablet Take 25 mg by mouth 2 (two) times daily.      . Multiple Vitamin (MULTIVITAMIN WITH MINERALS) TABS tablet Take 1 tablet by mouth at bedtime. Centrum Silver      . Multiple Vitamins-Minerals (OCUVITE PO) Take 1 tablet by mouth daily.       . nitroGLYCERIN (NITROSTAT) 0.4 MG SL tablet Place 1 tablet (0.4 mg total) under the tongue every 5 (five) minutes as needed for chest pain (up to 3 doses).  25 tablet  3  . nystatin (MYCOSTATIN/NYSTOP) 100000 UNIT/GM POWD Apply 1 g topically 3 (three) times daily.      . polyethylene glycol (MIRALAX / GLYCOLAX) packet Take 17 g by mouth daily.      . prochlorperazine (COMPAZINE) 10 MG tablet Take 1 tablet (10 mg total) by mouth every 6 (six) hours as needed (Nausea or vomiting).  30 tablet  1  . sennosides-docusate sodium (SENOKOT-S) 8.6-50 MG tablet Take 2 tablets by mouth at bedtime.      . traMADol (ULTRAM) 50 MG tablet Take 1 tablet (50 mg total) by mouth every 6 (six) hours as needed for moderate pain.  30 tablet  2   No current facility-administered medications for this visit.   Facility-Administered Medications Ordered in Other Visits  Medication Dose Route Frequency Provider Last Rate Last Dose  . 0.45 % sodium chloride infusion   Intravenous Continuous Chauncey Cruel, MD        OBJECTIVE: Middle-aged white Taylor  in no acute distress Filed Vitals:   07/07/14 0825  BP: 134/73  Pulse: 88  Temp: 97.7 F (36.5 C)  Resp: 20     Body mass index is 31.47 kg/(m^2).    ECOG FS:2 - Symptomatic, <50% confined to bed [name]   07/07/14 0825  Weight: 172 lb 1.6 oz (78.064 kg)   Sclerae unicteric, no obvious inflammation and ordered exudate noted in the right Oropharynx clear and moist-- full upper plate No cervical or supraclavicular adenopathy Lungs no rales or rhonchi Heart regular rate and rhythm Abd  soft, obese, nontender, positive bowel sounds MSK kyphosis but no focal spinal tenderness Neuro: nonfocal, well oriented, anxious affect Breasts: Deferred   LAB RESULTS: Results for Sue, Taylor (MRN [id number]) as of 07/07/2014 08:54  Ref. Range 05/01/2014 09:51 05/05/2014 09:43 05/19/2014 12:55 06/09/2014 10:23 06/30/2014 10:31  M-SPIKE, % No range found 5.08 3.67 0.69 0.31 0.19  Results for Sue, Taylor (MRN [id number]) as of 07/07/2014 08:54  Ref. Range 03/25/2014 08:40 04/08/2014 11:33 04/28/2014 09:21 05/01/2014 09:51 05/05/2014 09:43 05/19/2014 12:55 06/09/2014 10:23 06/30/2014 10:31  Kappa free light chain Latest Range: 0.33-1.94 mg/dL  12.90 (H) 15.10 (H) 9.04 (H) 5.91 (H) 3.82 (H) 2.90 (H) 1.67   Lab Results  Component Value Date   WBC 11.0* 07/07/2014   NEUTROABS 9.2* 07/07/2014   HGB 12.8 07/07/2014   HCT 40.4 07/07/2014   MCV 86.4 07/07/2014   PLT 212 07/07/2014      Chemistry      Component Value Date/Time   NA 139 06/30/2014 1032   NA 134* 04/14/2014 2350   K 4.7 06/30/2014 1032   K 5.1 04/14/2014 2350   CL 96 04/14/2014 2350   CO2 24 06/30/2014 1032   CO2 20 04/14/2014 2350   BUN 15.5 06/30/2014 1032   BUN 31* 04/14/2014 2350   CREATININE 0.9 06/30/2014 1032   CREATININE 1.37* 04/14/2014 2350   CREATININE 0.81 12/09/2013 1000   GLU 173 11/08/2010      Component Value Date/Time   CALCIUM 8.7 06/30/2014 1032   CALCIUM 9.0 04/14/2014 2350   CALCIUM 11.1* 03/25/2014 0850   ALKPHOS 121 06/30/2014 1032   ALKPHOS 72 04/14/2014 2350   AST 12 06/30/2014 1032   AST 29 04/14/2014 2350   ALT 14 06/30/2014 1032   ALT 10 04/14/2014 2350   BILITOT 0.43 06/30/2014 1032   BILITOT 0.7 04/14/2014 2350       STUDIES: No results found.  ASSESSMENT: The  patient is a 68 y.o.[name] presenting with hypercalcemia, anemia and mottled bone lesions April 2015, M-spike 3.82 g, IFE showing IgA/kappa myeloma with bone marrow biopsy 03/30/2014 with 88% plasma cells, FISH  t(4,14), deletion 13 and  deletion 11 (intermediate risk); initial beta-2 microglobulin 16.  (1) considered E1A11 study, but unable to tolerate anticoagulation  (2) associated issues:  (a) coagulopathy: history of post-op PE; history of bleeding with warfarin; epistaxis with  ASA 325 ms/ day; switched to 81 mg/ day with fair tolerance  (c) hypercalcemia: started zolendronate 03/26/2014, to be repeated monthly  (d) pain: compression fracture L1, rib fractures: on tylenol and tramadol  (e) immunocompromise: on acyclovir and Septra prophylaxis  (3) started dexamethasone 40 mg/ week (20 mg po on Tues and Weds April 4854); complicated by hyperglycemia with history of diabetes, typically followed by Dr. Everlene Farrier  (4) started bortezomib SQ 04/28/2014, to be given days 1,4, 8 and 11 of ech 21 day cycle  (5) will start lenalidomide once patient is able to tolerate full dose ASA anticoagulation  PLAN: We spent approximately 50 minutes going over [name] situation today. She is having an excellent response to her current treatment. We are continuing the subcutaneous bortezomib on days 1, 4, 8, and 11 of every three-week cycle, with the next cycle scheduled to start August 4. We are also continuing the dexamethasone for now, at 20 mg twice a week.  Today the only change we are making is dosing up her aspirin to 325 mg daily. She has not had any epistaxis now for many weeks. If she tolerates this dose of aspirin, then at the next visit here I will start her on Revlimid.  She has been reading some information on Revlimid and she is convinced it is going to cause cancer and kill her. I don't think she is able to really understand critically the difference between exceedingly rare complications and common side effects. I have asked her to not read that information. I will give her the information she needs once we get to start that pill so she doesn't become excessively anxious.  I have encouraged her to do "stand ups" to strengthen her  quads, since dexamethasone, and specifically weakened muscle. I am also writing her eyedrops to take for the next week for her right eye symptoms. Also, she will take MiraLAX daily to make sure her bowel movements are soft  The overall plan, then, is to start her on lenalidomide, and once we document that she can tolerated, started peeling back the dexamethasone first and then the bortezomib. She would then be on lenalidomide maintenance as long as there is no evidence of disease progression.  Additionally, new data shows that zolendronate every 3 months is as effective as monthly, therefore we are not repeating her zolendronate until October.  [name] has a good understanding of this plan. She remains anxious but does agree to it. She knows the goal of treatment in her case is control. She will call with any problems that may develop before the next visit here. Chauncey Cruel, MD   07/07/2014 8:55 AM

## 2014-07-07 NOTE — Telephone Encounter (Signed)
cld to adv pt of the appt time for 7/24-adv pt to picjk up updated copy of sch-pt understood

## 2014-07-08 ENCOUNTER — Telehealth: Payer: Self-pay

## 2014-07-08 NOTE — Telephone Encounter (Signed)
Returned pt call - wanted to see GM Friday when she is in for labs and injection.  Pt's questions relate to Revlimid - she is scared of side effects.  I let patient know cancer is a rare complication - that all medications have side effects and ultimately she would need to weight the benefits vs possible side effects for herself.  Advised patient that she can decide if she does not want to take a medication.  Pt also asked if she would have to take revlimid for the rest of her life.  I let her that each patient's situation is different and the length of time she would be on the med would depend on the response to the med therefore could not be determined at this time.  Pt voiced understanding and wants assurance we will pass on her book to GM.  Assured her I would.

## 2014-07-09 ENCOUNTER — Ambulatory Visit (INDEPENDENT_AMBULATORY_CARE_PROVIDER_SITE_OTHER): Payer: Medicare Other | Admitting: Emergency Medicine

## 2014-07-09 ENCOUNTER — Telehealth: Payer: Self-pay

## 2014-07-09 ENCOUNTER — Encounter: Payer: Self-pay | Admitting: Emergency Medicine

## 2014-07-09 VITALS — BP 138/81 | HR 85 | Temp 97.5°F | Resp 16 | Ht 62.0 in | Wt 174.0 lb

## 2014-07-09 DIAGNOSIS — E119 Type 2 diabetes mellitus without complications: Secondary | ICD-10-CM

## 2014-07-09 DIAGNOSIS — I2510 Atherosclerotic heart disease of native coronary artery without angina pectoris: Secondary | ICD-10-CM

## 2014-07-09 LAB — GLUCOSE, POCT (MANUAL RESULT ENTRY): POC GLUCOSE: 173 mg/dL — AB (ref 70–99)

## 2014-07-09 LAB — POCT GLYCOSYLATED HEMOGLOBIN (HGB A1C): HEMOGLOBIN A1C: 6.7

## 2014-07-09 NOTE — Progress Notes (Addendum)
Subjective:    Patient ID: [name], female    DOB: 1946/07/01, 68 y.o.   MRN: [id number]  This chart was scribed for Darlyne Russian, MD by Cathie Hoops, ED Scribe. The patient was seen in Room 21. The patient's care was started at 11:13 AM.   Chief Complaint  Patient presents with  . Follow-up    6 weeks   Past Medical History  Diagnosis Date  . Neurofibromatosis   . Diabetes mellitus   . HTN (hypertension)   . Obesity   . Osteoarthritis, knee   . Hyperlipidemia     statin intolerant. LDL is 83  . UTI (lower urinary tract infection) 05/29/12  . 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 Pulmonary embolism [id number]   Current Outpatient Prescriptions on File Prior to Visit  Medication Sig Dispense Refill  . acetaminophen (TYLENOL) 325 MG tablet Take 1-2 tablets (325-650 mg total) by mouth every 4 (four) hours as needed for mild pain.      Marland Kitchen acyclovir (ZOVIRAX) 400 MG tablet Take 1 tablet (400 mg total) by mouth 2 (two) times daily. Order was written for 5 times daily but Dr. Jana Hakim said it should be bid.  60 tablet  6  . aspirin 81 MG EC tablet Take 4 tablets (325 mg total) by mouth daily.  100 tablet  6  . calcium carbonate (TUMS EX) 750 MG chewable tablet Chew 1 tablet by mouth daily.      . cetirizine (ZYRTEC) 10 MG tablet Take 10 mg by mouth daily.      Marland Kitchen dexamethasone (DECADRON) 4 MG tablet Take 5 tablets (20 mg total) by mouth as directed.  40 tablet  6  . fluconazole (DIFLUCAN) 100 MG tablet       . glimepiride (AMARYL) 2 MG tablet Take 2 tablets by mouth every morning with breakfast.  On the days that you take prednisone or have chemotherapy take another dose at the evening meal  90 tablet  1  . glucose blood (ONE TOUCH ULTRA TEST) test strip 1 each by Other route 3 (three) times daily.  100 each  12  . Lancets MISC Check glucose 3 times daily  100 each  6  . methocarbamol (ROBAXIN) 500 MG tablet Take  500 mg by mouth every 6 (six) hours as needed for muscle spasms.      . metoprolol tartrate (LOPRESSOR) 25 MG tablet Take 25 mg by mouth 2 (two) times daily.      . Multiple Vitamin (MULTIVITAMIN WITH MINERALS) TABS tablet Take 1 tablet by mouth at bedtime. Centrum Silver      . Multiple Vitamins-Minerals (OCUVITE PO) Take 1 tablet by mouth daily.       . nitroGLYCERIN (NITROSTAT) 0.4 MG SL tablet Place 1 tablet (0.4 mg total) under the tongue every 5 (five) minutes as needed for chest pain (up to 3 doses).  25 tablet  3  . nystatin (MYCOSTATIN/NYSTOP) 100000 UNIT/GM POWD Apply 1 g topically 3 (three) times daily.      . polyethylene glycol (MIRALAX / GLYCOLAX) packet Take 17 g by mouth daily.      . prochlorperazine (COMPAZINE) 10 MG tablet Take 1 tablet (10 mg total) by mouth every 6 (six) hours as needed (Nausea or vomiting).  30 tablet  1  . sennosides-docusate sodium (SENOKOT-S) 8.6-50 MG tablet Take 2 tablets by mouth at bedtime.      Marland Kitchen  tobramycin-dexamethasone (TOBRADEX) ophthalmic solution Place 1 drop into both eyes every 4 (four) hours while awake.  5 mL  0  . traMADol (ULTRAM) 50 MG tablet Take 1 tablet (50 mg total) by mouth every 6 (six) hours as needed for moderate pain.  30 tablet  2   Current Facility-Administered Medications on File Prior to Visit  Medication Dose Route Frequency Provider Last Rate Last Dose  . 0.45 % sodium chloride infusion   Intravenous Continuous Lowella Dell, MD       Allergies  Allergen Reactions  . Codeine Nausea And Vomiting    Sees things  . Hydrocodone Nausea And Vomiting    Sees things  . Niaspan [Niacin Er] Nausea Only  . Robaxin [Methocarbamol] Other (See Comments)    Made patient feel funny  . Statins Nausea And Vomiting and Other (See Comments)    Myalgias/leg pain with atorvastatin, rosuvastatin, lovastatin, simvastatin  . Tetracycline Other (See Comments)    Pt doesn't remember reaction  . Zetia [Ezetimibe] Nausea And Vomiting  .  Zithromax [Azithromycin] Nausea And Vomiting    HPI HPI Comments: [name] is a 68 y.o. female who presents to the [facility] for follow-up for multiple myeloma. Pt reports currently taking 4 baby aspirins every morning. Pt reports her back pain has decreased. Pt reports stopping pain medication to relieve her back pain.   Pt states her blood sugar is elevated when she takes her steroids. Pt reports doubling her medications on days she takes steroids.   Pt reports her blood sugar is normally 127-135 except when she uses prednisone. Pt reports her blood sugar elevates to 285 when she takes prednisone. Pt reports she last took prednisone on 07/08/14. Pt denies seeing an orthopedist for her back.  Pt brought in a cancer report that states pt had M-spike protein level of 5 in May 2015, with the most recent reporting decreased to 0.15.   Pt increase aspirin to 325 MG per day. Pt also has been told to do 20 step-ups for exercise per day.  Pt has past medical history of  multiple myeloma, diabetes mellitus, severe chronic back pain and hypertension.  Pt is concerned with her new medication's potential side effects.   Review of Systems  Constitutional: Negative for fever and chills.  Respiratory: Negative for shortness of breath.   Gastrointestinal: Negative for nausea and vomiting.  Neurological: Negative for weakness.      Triage Vitals: BP 138/81  Pulse 85  Temp(Src) 97.5 F (36.4 C)  Resp 16  Ht 5\' 2"  (1.575 m)  Wt 174 lb (78.926 kg)  BMI 31.82 kg/m2  SpO2 95%   Objective:   Physical Exam CONSTITUTIONAL: Well developed/well nourished HEAD: Normocephalic/atraumatic EYES: EOMI/PERRL ENMT: Mucous membranes moist NECK: supple no meningeal signs SPINE: No tenderness over her lumbar spine. CV: S1/S2 noted, no murmurs/rubs/gallops noted LUNGS: Lungs are clear to auscultation bilaterally, no apparent distress ABDOMEN: soft, nontender, no rebound or  guarding GU:no cva tenderness NEURO: Pt is awake/alert, moves all extremitiesx4 EXTREMITIES: pulses normal, full ROM SKIN: warm, color normal. Multiple neural fibroma associated with cafe au lait spots. PSYCH: no abnormalities of mood noted Results for orders placed in visit on 07/09/14  GLUCOSE, POCT (MANUAL RESULT ENTRY)      Result Value Ref Range   POC Glucose 173 (*) 70 - 99 mg/dl  POCT GLYCOSYLATED HEMOGLOBIN (HGB A1C)      Result Value Ref Range   Hemoglobin A1C 6.7  Assessment & Plan:   DIAGNOSTIC STUDIES: Oxygen Saturation is 95% on RA, adequate by my interpretation.    COORDINATION OF CARE: 11:23 AM- Pt told to ensure her nostrils are well moisturized by using saline spray and Vaseline. Patient informed of current plan for treatment and evaluation and agrees with plan at this time. Her A1c is 6.7 which is acceptable. She has bumps in her sugar when she has to take the prednisone. She is going to try the full dose aspirin and then start her new chemotherapy for myeloma treatment. She is comfortable with this. We will do an x-ray of her LS-spine at that time to followup on her lumbar compression fracture.

## 2014-07-09 NOTE — Telephone Encounter (Signed)
[facility] medicare dept faxed form for DM supply coverage. Completed and put in Dr Perfecto Kingdom box for signature. Then to be scanned after faxing.

## 2014-07-10 ENCOUNTER — Ambulatory Visit (HOSPITAL_BASED_OUTPATIENT_CLINIC_OR_DEPARTMENT_OTHER): Payer: Medicare Other

## 2014-07-10 ENCOUNTER — Other Ambulatory Visit (HOSPITAL_BASED_OUTPATIENT_CLINIC_OR_DEPARTMENT_OTHER): Payer: Medicare Other

## 2014-07-10 VITALS — BP 135/84 | HR 77 | Temp 97.0°F | Resp 16

## 2014-07-10 DIAGNOSIS — C9000 Multiple myeloma not having achieved remission: Secondary | ICD-10-CM

## 2014-07-10 DIAGNOSIS — Z5112 Encounter for antineoplastic immunotherapy: Secondary | ICD-10-CM

## 2014-07-10 LAB — CBC WITH DIFFERENTIAL/PLATELET
BASO%: 0.2 % (ref 0.0–2.0)
Basophils Absolute: 0 10*3/uL (ref 0.0–0.1)
EOS%: 0.5 % (ref 0.0–7.0)
Eosinophils Absolute: 0 10*3/uL (ref 0.0–0.5)
HEMATOCRIT: 38.5 % (ref 34.8–46.6)
HGB: 12.2 g/dL (ref 11.6–15.9)
LYMPH%: 13 % — ABNORMAL LOW (ref 14.0–49.7)
MCH: 27.5 pg (ref 25.1–34.0)
MCHC: 31.8 g/dL (ref 31.5–36.0)
MCV: 86.4 fL (ref 79.5–101.0)
MONO#: 1.5 10*3/uL — ABNORMAL HIGH (ref 0.1–0.9)
MONO%: 20.2 % — ABNORMAL HIGH (ref 0.0–14.0)
NEUT#: 4.9 10*3/uL (ref 1.5–6.5)
NEUT%: 66.1 % (ref 38.4–76.8)
Platelets: 203 10*3/uL (ref 145–400)
RBC: 4.45 10*6/uL (ref 3.70–5.45)
RDW: 17.1 % — AB (ref 11.2–14.5)
WBC: 7.3 10*3/uL (ref 3.9–10.3)
lymph#: 1 10*3/uL (ref 0.9–3.3)

## 2014-07-10 LAB — COMPREHENSIVE METABOLIC PANEL (CC13)
ALBUMIN: 3 g/dL — AB (ref 3.5–5.0)
ALT: 13 U/L (ref 0–55)
ANION GAP: 10 meq/L (ref 3–11)
AST: 13 U/L (ref 5–34)
Alkaline Phosphatase: 102 U/L (ref 40–150)
BUN: 26.9 mg/dL — ABNORMAL HIGH (ref 7.0–26.0)
CALCIUM: 8.5 mg/dL (ref 8.4–10.4)
CO2: 25 meq/L (ref 22–29)
CREATININE: 1.1 mg/dL (ref 0.6–1.1)
Chloride: 106 mEq/L (ref 98–109)
Glucose: 242 mg/dl — ABNORMAL HIGH (ref 70–140)
POTASSIUM: 4.5 meq/L (ref 3.5–5.1)
SODIUM: 140 meq/L (ref 136–145)
TOTAL PROTEIN: 5.8 g/dL — AB (ref 6.4–8.3)
Total Bilirubin: 0.3 mg/dL (ref 0.20–1.20)

## 2014-07-10 MED ORDER — ONDANSETRON HCL 8 MG PO TABS
8.0000 mg | ORAL_TABLET | Freq: Once | ORAL | Status: AC
  Administered 2014-07-10: 8 mg via ORAL

## 2014-07-10 MED ORDER — ONDANSETRON HCL 8 MG PO TABS
ORAL_TABLET | ORAL | Status: AC
  Filled 2014-07-10: qty 1

## 2014-07-10 MED ORDER — BORTEZOMIB CHEMO SQ INJECTION 3.5 MG (2.5MG/ML)
1.3000 mg/m2 | Freq: Once | INTRAMUSCULAR | Status: AC
  Administered 2014-07-10: 2.5 mg via SUBCUTANEOUS
  Filled 2014-07-10: qty 2.5

## 2014-07-10 NOTE — Patient Instructions (Signed)
[facility] Discharge Instructions for Patients Receiving Chemotherapy  Today you received the following chemotherapy agents :  Velcade.  To help prevent nausea and vomiting after your treatment, we encourage you to take your nausea medication as prescribed by your physician.   If you develop nausea and vomiting that is not controlled by your nausea medication, call the clinic.   BELOW ARE SYMPTOMS THAT SHOULD BE REPORTED IMMEDIATELY:  *FEVER GREATER THAN 100.5 F  *CHILLS WITH OR WITHOUT FEVER  NAUSEA AND VOMITING THAT IS NOT CONTROLLED WITH YOUR NAUSEA MEDICATION  *UNUSUAL SHORTNESS OF BREATH  *UNUSUAL BRUISING OR BLEEDING  TENDERNESS IN MOUTH AND THROAT WITH OR WITHOUT PRESENCE OF ULCERS  *URINARY PROBLEMS  *BOWEL PROBLEMS  UNUSUAL RASH Items with * indicate a potential emergency and should be followed up as soon as possible.  Feel free to call the clinic you have any questions or concerns. The clinic phone number is [phone].

## 2014-07-14 ENCOUNTER — Ambulatory Visit: Payer: Medicare Other | Admitting: Oncology

## 2014-07-21 ENCOUNTER — Other Ambulatory Visit (HOSPITAL_BASED_OUTPATIENT_CLINIC_OR_DEPARTMENT_OTHER): Payer: Medicare Other

## 2014-07-21 ENCOUNTER — Ambulatory Visit (HOSPITAL_BASED_OUTPATIENT_CLINIC_OR_DEPARTMENT_OTHER): Payer: Medicare Other

## 2014-07-21 ENCOUNTER — Ambulatory Visit: Payer: Medicare Other

## 2014-07-21 DIAGNOSIS — Z5112 Encounter for antineoplastic immunotherapy: Secondary | ICD-10-CM

## 2014-07-21 DIAGNOSIS — C9000 Multiple myeloma not having achieved remission: Secondary | ICD-10-CM

## 2014-07-21 LAB — CBC WITH DIFFERENTIAL/PLATELET
BASO%: 0.6 % (ref 0.0–2.0)
Basophils Absolute: 0.1 10*3/uL (ref 0.0–0.1)
EOS%: 1 % (ref 0.0–7.0)
Eosinophils Absolute: 0.1 10*3/uL (ref 0.0–0.5)
HCT: 39.1 % (ref 34.8–46.6)
HEMOGLOBIN: 12.5 g/dL (ref 11.6–15.9)
LYMPH%: 3.9 % — ABNORMAL LOW (ref 14.0–49.7)
MCH: 27.5 pg (ref 25.1–34.0)
MCHC: 32 g/dL (ref 31.5–36.0)
MCV: 85.9 fL (ref 79.5–101.0)
MONO#: 0.4 10*3/uL (ref 0.1–0.9)
MONO%: 3.8 % (ref 0.0–14.0)
NEUT#: 9.4 10*3/uL — ABNORMAL HIGH (ref 1.5–6.5)
NEUT%: 90.7 % — ABNORMAL HIGH (ref 38.4–76.8)
Platelets: 295 10*3/uL (ref 145–400)
RBC: 4.54 10*6/uL (ref 3.70–5.45)
RDW: 17.4 % — ABNORMAL HIGH (ref 11.2–14.5)
WBC: 10.3 10*3/uL (ref 3.9–10.3)
lymph#: 0.4 10*3/uL — ABNORMAL LOW (ref 0.9–3.3)

## 2014-07-21 LAB — COMPREHENSIVE METABOLIC PANEL (CC13)
ALBUMIN: 3 g/dL — AB (ref 3.5–5.0)
ALK PHOS: 87 U/L (ref 40–150)
ALT: 11 U/L (ref 0–55)
AST: 16 U/L (ref 5–34)
Anion Gap: 10 mEq/L (ref 3–11)
BUN: 17 mg/dL (ref 7.0–26.0)
CALCIUM: 9.1 mg/dL (ref 8.4–10.4)
CHLORIDE: 105 meq/L (ref 98–109)
CO2: 25 mEq/L (ref 22–29)
Creatinine: 1 mg/dL (ref 0.6–1.1)
Glucose: 293 mg/dl — ABNORMAL HIGH (ref 70–140)
Potassium: 4.9 mEq/L (ref 3.5–5.1)
SODIUM: 140 meq/L (ref 136–145)
TOTAL PROTEIN: 6.4 g/dL (ref 6.4–8.3)
Total Bilirubin: 0.35 mg/dL (ref 0.20–1.20)

## 2014-07-21 MED ORDER — ONDANSETRON HCL 8 MG PO TABS
ORAL_TABLET | ORAL | Status: AC
  Filled 2014-07-21: qty 1

## 2014-07-21 MED ORDER — BORTEZOMIB CHEMO SQ INJECTION 3.5 MG (2.5MG/ML)
1.3000 mg/m2 | Freq: Once | INTRAMUSCULAR | Status: AC
  Administered 2014-07-21: 2.5 mg via SUBCUTANEOUS
  Filled 2014-07-21: qty 2.5

## 2014-07-21 MED ORDER — ONDANSETRON HCL 8 MG PO TABS
8.0000 mg | ORAL_TABLET | Freq: Once | ORAL | Status: AC
  Administered 2014-07-21: 8 mg via ORAL

## 2014-07-21 NOTE — Patient Instructions (Signed)
[facility] Discharge Instructions for Patients Receiving Chemotherapy  Today you received the following chemotherapy agents velcade   To help prevent nausea and vomiting after your treatment, we encourage you to take your nausea medication as directed  If you develop nausea and vomiting that is not controlled by your nausea medication, call the clinic.   BELOW ARE SYMPTOMS THAT SHOULD BE REPORTED IMMEDIATELY:  *FEVER GREATER THAN 100.5 F  *CHILLS WITH OR WITHOUT FEVER  NAUSEA AND VOMITING THAT IS NOT CONTROLLED WITH YOUR NAUSEA MEDICATION  *UNUSUAL SHORTNESS OF BREATH  *UNUSUAL BRUISING OR BLEEDING  TENDERNESS IN MOUTH AND THROAT WITH OR WITHOUT PRESENCE OF ULCERS  *URINARY PROBLEMS  *BOWEL PROBLEMS  UNUSUAL RASH Items with * indicate a potential emergency and should be followed up as soon as possible.  Feel free to call the clinic you have any questions or concerns. The clinic phone number is [phone].  

## 2014-07-23 LAB — KAPPA/LAMBDA LIGHT CHAINS
KAPPA FREE LGHT CHN: 2.71 mg/dL — AB (ref 0.33–1.94)
Lambda Free Lght Chn: <0.03 mg/dL — ABNORMAL LOW (ref 0.57–2.63)

## 2014-07-23 LAB — PROTEIN ELECTROPHORESIS, SERUM
Albumin ELP: 56.9 % (ref 55.8–66.1)
Alpha-1-Globulin: 6.1 % — ABNORMAL HIGH (ref 2.9–4.9)
Alpha-2-Globulin: 14 % — ABNORMAL HIGH (ref 7.1–11.8)
BETA 2: 10.9 % — AB (ref 3.2–6.5)
BETA GLOBULIN: 7.4 % — AB (ref 4.7–7.2)
Gamma Globulin: 4.7 % — ABNORMAL LOW (ref 11.1–18.8)
M-SPIKE, %: 0.36 g/dL
Total Protein, Serum Electrophoresis: 6.1 g/dL (ref 6.0–8.3)

## 2014-07-24 ENCOUNTER — Ambulatory Visit: Payer: Medicare Other

## 2014-07-24 ENCOUNTER — Ambulatory Visit (HOSPITAL_BASED_OUTPATIENT_CLINIC_OR_DEPARTMENT_OTHER): Payer: Medicare Other

## 2014-07-24 ENCOUNTER — Other Ambulatory Visit (HOSPITAL_BASED_OUTPATIENT_CLINIC_OR_DEPARTMENT_OTHER): Payer: Medicare Other

## 2014-07-24 DIAGNOSIS — C9000 Multiple myeloma not having achieved remission: Secondary | ICD-10-CM

## 2014-07-24 DIAGNOSIS — Z5112 Encounter for antineoplastic immunotherapy: Secondary | ICD-10-CM

## 2014-07-24 LAB — CBC WITH DIFFERENTIAL/PLATELET
BASO%: 1 % (ref 0.0–2.0)
Basophils Absolute: 0.1 10*3/uL (ref 0.0–0.1)
EOS ABS: 0.1 10*3/uL (ref 0.0–0.5)
EOS%: 0.6 % (ref 0.0–7.0)
HCT: 38.9 % (ref 34.8–46.6)
HGB: 12.3 g/dL (ref 11.6–15.9)
LYMPH#: 1 10*3/uL (ref 0.9–3.3)
LYMPH%: 10.5 % — AB (ref 14.0–49.7)
MCH: 27.1 pg (ref 25.1–34.0)
MCHC: 31.7 g/dL (ref 31.5–36.0)
MCV: 85.6 fL (ref 79.5–101.0)
MONO#: 1.5 10*3/uL — AB (ref 0.1–0.9)
MONO%: 15.5 % — ABNORMAL HIGH (ref 0.0–14.0)
NEUT%: 72.4 % (ref 38.4–76.8)
NEUTROS ABS: 7 10*3/uL — AB (ref 1.5–6.5)
Platelets: 283 10*3/uL (ref 145–400)
RBC: 4.55 10*6/uL (ref 3.70–5.45)
RDW: 16.9 % — AB (ref 11.2–14.5)
WBC: 9.7 10*3/uL (ref 3.9–10.3)

## 2014-07-24 LAB — COMPREHENSIVE METABOLIC PANEL (CC13)
ALBUMIN: 3 g/dL — AB (ref 3.5–5.0)
ALT: 14 U/L (ref 0–55)
ANION GAP: 9 meq/L (ref 3–11)
AST: 12 U/L (ref 5–34)
Alkaline Phosphatase: 79 U/L (ref 40–150)
BILIRUBIN TOTAL: 0.4 mg/dL (ref 0.20–1.20)
BUN: 24.5 mg/dL (ref 7.0–26.0)
CHLORIDE: 103 meq/L (ref 98–109)
CO2: 28 meq/L (ref 22–29)
Calcium: 9.1 mg/dL (ref 8.4–10.4)
Creatinine: 1.1 mg/dL (ref 0.6–1.1)
GLUCOSE: 254 mg/dL — AB (ref 70–140)
POTASSIUM: 4.5 meq/L (ref 3.5–5.1)
SODIUM: 140 meq/L (ref 136–145)
TOTAL PROTEIN: 6 g/dL — AB (ref 6.4–8.3)

## 2014-07-24 MED ORDER — BORTEZOMIB CHEMO SQ INJECTION 3.5 MG (2.5MG/ML)
1.3000 mg/m2 | Freq: Once | INTRAMUSCULAR | Status: AC
  Administered 2014-07-24: 2.5 mg via SUBCUTANEOUS
  Filled 2014-07-24: qty 2.5

## 2014-07-24 MED ORDER — ONDANSETRON HCL 8 MG PO TABS
8.0000 mg | ORAL_TABLET | Freq: Once | ORAL | Status: AC
  Administered 2014-07-24: 8 mg via ORAL

## 2014-07-24 MED ORDER — ONDANSETRON HCL 8 MG PO TABS
ORAL_TABLET | ORAL | Status: AC
  Filled 2014-07-24: qty 1

## 2014-07-24 NOTE — Patient Instructions (Signed)
[facility] Discharge Instructions for Patients Receiving Chemotherapy  Today you received the following chemotherapy agents: Velcade.  To help prevent nausea and vomiting after your treatment, we encourage you to take your nausea medication as prescribed.   If you develop nausea and vomiting that is not controlled by your nausea medication, call the clinic.   BELOW ARE SYMPTOMS THAT SHOULD BE REPORTED IMMEDIATELY:  *FEVER GREATER THAN 100.5 F  *CHILLS WITH OR WITHOUT FEVER  NAUSEA AND VOMITING THAT IS NOT CONTROLLED WITH YOUR NAUSEA MEDICATION  *UNUSUAL SHORTNESS OF BREATH  *UNUSUAL BRUISING OR BLEEDING  TENDERNESS IN MOUTH AND THROAT WITH OR WITHOUT PRESENCE OF ULCERS  *URINARY PROBLEMS  *BOWEL PROBLEMS  UNUSUAL RASH Items with * indicate a potential emergency and should be followed up as soon as possible.  Feel free to call the clinic should you have any questions or concerns. The clinic phone number is [phone].

## 2014-07-28 ENCOUNTER — Ambulatory Visit (HOSPITAL_BASED_OUTPATIENT_CLINIC_OR_DEPARTMENT_OTHER): Payer: Medicare Other

## 2014-07-28 ENCOUNTER — Other Ambulatory Visit (HOSPITAL_BASED_OUTPATIENT_CLINIC_OR_DEPARTMENT_OTHER): Payer: Medicare Other

## 2014-07-28 ENCOUNTER — Telehealth: Payer: Self-pay | Admitting: *Deleted

## 2014-07-28 ENCOUNTER — Ambulatory Visit: Payer: Medicare Other

## 2014-07-28 DIAGNOSIS — Z5112 Encounter for antineoplastic immunotherapy: Secondary | ICD-10-CM

## 2014-07-28 DIAGNOSIS — C9000 Multiple myeloma not having achieved remission: Secondary | ICD-10-CM

## 2014-07-28 LAB — CBC WITH DIFFERENTIAL/PLATELET
BASO%: 0.1 % (ref 0.0–2.0)
Basophils Absolute: 0 10*3/uL (ref 0.0–0.1)
EOS%: 1 % (ref 0.0–7.0)
Eosinophils Absolute: 0.1 10*3/uL (ref 0.0–0.5)
HEMATOCRIT: 39.1 % (ref 34.8–46.6)
HGB: 12.6 g/dL (ref 11.6–15.9)
LYMPH%: 5.3 % — AB (ref 14.0–49.7)
MCH: 27.5 pg (ref 25.1–34.0)
MCHC: 32.2 g/dL (ref 31.5–36.0)
MCV: 85.4 fL (ref 79.5–101.0)
MONO#: 0.4 10*3/uL (ref 0.1–0.9)
MONO%: 4.8 % (ref 0.0–14.0)
NEUT#: 7 10*3/uL — ABNORMAL HIGH (ref 1.5–6.5)
NEUT%: 88.8 % — AB (ref 38.4–76.8)
Platelets: 209 10*3/uL (ref 145–400)
RBC: 4.58 10*6/uL (ref 3.70–5.45)
RDW: 15.1 % — ABNORMAL HIGH (ref 11.2–14.5)
WBC: 7.9 10*3/uL (ref 3.9–10.3)
lymph#: 0.4 10*3/uL — ABNORMAL LOW (ref 0.9–3.3)

## 2014-07-28 LAB — COMPREHENSIVE METABOLIC PANEL
ALT: 14 U/L (ref 0–35)
AST: 16 U/L (ref 0–37)
Albumin: 3 g/dL — ABNORMAL LOW (ref 3.5–5.2)
Alkaline Phosphatase: 86 U/L (ref 39–117)
BILIRUBIN TOTAL: 0.2 mg/dL — AB (ref 0.2–1.2)
BUN: 20 mg/dL (ref 6–23)
CO2: 25 meq/L (ref 19–32)
CREATININE: 0.9 mg/dL (ref 0.50–1.10)
Calcium: 9.4 mg/dL (ref 8.4–10.5)
Chloride: 98 mEq/L (ref 96–112)
GLUCOSE: 285 mg/dL — AB (ref 70–99)
Potassium: 4.6 mEq/L (ref 3.5–5.3)
Sodium: 137 mEq/L (ref 135–145)
TOTAL PROTEIN: 6.8 g/dL (ref 6.0–8.3)

## 2014-07-28 MED ORDER — BORTEZOMIB CHEMO SQ INJECTION 3.5 MG (2.5MG/ML)
1.3000 mg/m2 | Freq: Once | INTRAMUSCULAR | Status: AC
  Administered 2014-07-28: 2.5 mg via SUBCUTANEOUS
  Filled 2014-07-28: qty 2.5

## 2014-07-28 MED ORDER — ONDANSETRON HCL 8 MG PO TABS
8.0000 mg | ORAL_TABLET | Freq: Once | ORAL | Status: AC
  Administered 2014-07-28: 8 mg via ORAL

## 2014-07-28 MED ORDER — ONDANSETRON HCL 8 MG PO TABS
ORAL_TABLET | ORAL | Status: AC
  Filled 2014-07-28: qty 1

## 2014-07-28 NOTE — Telephone Encounter (Signed)
Per chemo RN I have moved lab appt up on 8/28

## 2014-07-28 NOTE — Patient Instructions (Signed)
[facility] Discharge Instructions for Patients Receiving Chemotherapy  Today you received the following chemotherapy agents:  Velcade  To help prevent nausea and vomiting after your treatment, we encourage you to take your nausea medication as ordered per MD.   If you develop nausea and vomiting that is not controlled by your nausea medication, call the clinic.   BELOW ARE SYMPTOMS THAT SHOULD BE REPORTED IMMEDIATELY:  *FEVER GREATER THAN 100.5 F  *CHILLS WITH OR WITHOUT FEVER  NAUSEA AND VOMITING THAT IS NOT CONTROLLED WITH YOUR NAUSEA MEDICATION  *UNUSUAL SHORTNESS OF BREATH  *UNUSUAL BRUISING OR BLEEDING  TENDERNESS IN MOUTH AND THROAT WITH OR WITHOUT PRESENCE OF ULCERS  *URINARY PROBLEMS  *BOWEL PROBLEMS  UNUSUAL RASH Items with * indicate a potential emergency and should be followed up as soon as possible.  Feel free to call the clinic you have any questions or concerns. The clinic phone number is [phone].    

## 2014-07-31 ENCOUNTER — Ambulatory Visit: Payer: Medicare Other

## 2014-07-31 ENCOUNTER — Other Ambulatory Visit (HOSPITAL_BASED_OUTPATIENT_CLINIC_OR_DEPARTMENT_OTHER): Payer: Medicare Other

## 2014-07-31 ENCOUNTER — Ambulatory Visit (HOSPITAL_BASED_OUTPATIENT_CLINIC_OR_DEPARTMENT_OTHER): Payer: Medicare Other

## 2014-07-31 DIAGNOSIS — C9000 Multiple myeloma not having achieved remission: Secondary | ICD-10-CM

## 2014-07-31 DIAGNOSIS — Z5112 Encounter for antineoplastic immunotherapy: Secondary | ICD-10-CM

## 2014-07-31 LAB — CBC WITH DIFFERENTIAL/PLATELET
BASO%: 0.3 % (ref 0.0–2.0)
Basophils Absolute: 0 10*3/uL (ref 0.0–0.1)
EOS ABS: 0.1 10*3/uL (ref 0.0–0.5)
EOS%: 0.8 % (ref 0.0–7.0)
HEMATOCRIT: 38.4 % (ref 34.8–46.6)
HGB: 12.2 g/dL (ref 11.6–15.9)
LYMPH%: 10.2 % — ABNORMAL LOW (ref 14.0–49.7)
MCH: 26.8 pg (ref 25.1–34.0)
MCHC: 31.9 g/dL (ref 31.5–36.0)
MCV: 84.3 fL (ref 79.5–101.0)
MONO#: 1.5 10*3/uL — AB (ref 0.1–0.9)
MONO%: 19.8 % — ABNORMAL HIGH (ref 0.0–14.0)
NEUT%: 68.9 % (ref 38.4–76.8)
NEUTROS ABS: 5.4 10*3/uL (ref 1.5–6.5)
Platelets: 186 10*3/uL (ref 145–400)
RBC: 4.55 10*6/uL (ref 3.70–5.45)
RDW: 16.5 % — ABNORMAL HIGH (ref 11.2–14.5)
WBC: 7.8 10*3/uL (ref 3.9–10.3)
lymph#: 0.8 10*3/uL — ABNORMAL LOW (ref 0.9–3.3)

## 2014-07-31 LAB — COMPREHENSIVE METABOLIC PANEL (CC13)
ALBUMIN: 2.8 g/dL — AB (ref 3.5–5.0)
ALT: 13 U/L (ref 0–55)
ANION GAP: 8 meq/L (ref 3–11)
AST: 13 U/L (ref 5–34)
Alkaline Phosphatase: 77 U/L (ref 40–150)
BUN: 24.3 mg/dL (ref 7.0–26.0)
CHLORIDE: 103 meq/L (ref 98–109)
CO2: 29 meq/L (ref 22–29)
Calcium: 9.1 mg/dL (ref 8.4–10.4)
Creatinine: 1.1 mg/dL (ref 0.6–1.1)
Glucose: 244 mg/dl — ABNORMAL HIGH (ref 70–140)
Potassium: 4.4 mEq/L (ref 3.5–5.1)
SODIUM: 139 meq/L (ref 136–145)
TOTAL PROTEIN: 6 g/dL — AB (ref 6.4–8.3)

## 2014-07-31 MED ORDER — ONDANSETRON HCL 8 MG PO TABS
8.0000 mg | ORAL_TABLET | Freq: Once | ORAL | Status: AC
  Administered 2014-07-31: 8 mg via ORAL

## 2014-07-31 MED ORDER — ONDANSETRON HCL 8 MG PO TABS
ORAL_TABLET | ORAL | Status: AC
  Filled 2014-07-31: qty 1

## 2014-07-31 MED ORDER — BORTEZOMIB CHEMO SQ INJECTION 3.5 MG (2.5MG/ML)
1.3000 mg/m2 | Freq: Once | INTRAMUSCULAR | Status: AC
  Administered 2014-07-31: 2.5 mg via SUBCUTANEOUS
  Filled 2014-07-31: qty 2.5

## 2014-07-31 NOTE — Patient Instructions (Addendum)
[place] Discharge Instructions for Patients Receiving Chemotherapy  Today you received the following chemotherapy agents velcade.  To help prevent nausea and vomiting after your treatment, we encourage you to take your nausea medication compazine 10 mg every 6 hours as needed.   If you develop nausea and vomiting that is not controlled by your nausea medication, call the clinic.   BELOW ARE SYMPTOMS THAT SHOULD BE REPORTED IMMEDIATELY:  *FEVER GREATER THAN 100.5 F  *CHILLS WITH OR WITHOUT FEVER  NAUSEA AND VOMITING THAT IS NOT CONTROLLED WITH YOUR NAUSEA MEDICATION  *UNUSUAL SHORTNESS OF BREATH  *UNUSUAL BRUISING OR BLEEDING  TENDERNESS IN MOUTH AND THROAT WITH OR WITHOUT PRESENCE OF ULCERS  *URINARY PROBLEMS  *BOWEL PROBLEMS  UNUSUAL RASH Items with * indicate a potential emergency and should be followed up as soon as possible.  Feel free to call the clinic you have any questions or concerns. The clinic phone number is [phone].

## 2014-08-05 ENCOUNTER — Telehealth: Payer: Self-pay | Admitting: *Deleted

## 2014-08-05 NOTE — Telephone Encounter (Signed)
Called and spoke with [name] regarding scheduling an 68-month diabetes check-up.  She stated that she has an appointment with Dr. Everlene Farrier in October 2015.

## 2014-08-11 ENCOUNTER — Ambulatory Visit: Payer: Medicare Other

## 2014-08-11 ENCOUNTER — Other Ambulatory Visit (HOSPITAL_BASED_OUTPATIENT_CLINIC_OR_DEPARTMENT_OTHER): Payer: Medicare Other

## 2014-08-11 ENCOUNTER — Ambulatory Visit (HOSPITAL_BASED_OUTPATIENT_CLINIC_OR_DEPARTMENT_OTHER): Payer: Medicare Other

## 2014-08-11 VITALS — BP 147/86 | HR 83 | Temp 97.7°F | Resp 19

## 2014-08-11 DIAGNOSIS — C9000 Multiple myeloma not having achieved remission: Secondary | ICD-10-CM

## 2014-08-11 DIAGNOSIS — Z5112 Encounter for antineoplastic immunotherapy: Secondary | ICD-10-CM

## 2014-08-11 LAB — COMPREHENSIVE METABOLIC PANEL (CC13)
ALK PHOS: 79 U/L (ref 40–150)
ALT: 19 U/L (ref 0–55)
ANION GAP: 9 meq/L (ref 3–11)
AST: 19 U/L (ref 5–34)
Albumin: 2.9 g/dL — ABNORMAL LOW (ref 3.5–5.0)
BILIRUBIN TOTAL: 0.37 mg/dL (ref 0.20–1.20)
BUN: 16.5 mg/dL (ref 7.0–26.0)
CO2: 28 meq/L (ref 22–29)
CREATININE: 1 mg/dL (ref 0.6–1.1)
Calcium: 9.3 mg/dL (ref 8.4–10.4)
Chloride: 101 mEq/L (ref 98–109)
Glucose: 260 mg/dl — ABNORMAL HIGH (ref 70–140)
Potassium: 4.6 mEq/L (ref 3.5–5.1)
SODIUM: 137 meq/L (ref 136–145)
TOTAL PROTEIN: 6.7 g/dL (ref 6.4–8.3)

## 2014-08-11 LAB — CBC WITH DIFFERENTIAL/PLATELET
BASO%: 0.5 % (ref 0.0–2.0)
Basophils Absolute: 0.1 10*3/uL (ref 0.0–0.1)
EOS%: 1.5 % (ref 0.0–7.0)
Eosinophils Absolute: 0.1 10*3/uL (ref 0.0–0.5)
HCT: 39.4 % (ref 34.8–46.6)
HGB: 12.8 g/dL (ref 11.6–15.9)
LYMPH#: 0.4 10*3/uL — AB (ref 0.9–3.3)
LYMPH%: 3.9 % — AB (ref 14.0–49.7)
MCH: 27.3 pg (ref 25.1–34.0)
MCHC: 32.4 g/dL (ref 31.5–36.0)
MCV: 84.3 fL (ref 79.5–101.0)
MONO#: 0.4 10*3/uL (ref 0.1–0.9)
MONO%: 4.3 % (ref 0.0–14.0)
NEUT#: 9.2 10*3/uL — ABNORMAL HIGH (ref 1.5–6.5)
NEUT%: 89.8 % — AB (ref 38.4–76.8)
Platelets: 273 10*3/uL (ref 145–400)
RBC: 4.68 10*6/uL (ref 3.70–5.45)
RDW: 16.5 % — ABNORMAL HIGH (ref 11.2–14.5)
WBC: 10.3 10*3/uL (ref 3.9–10.3)

## 2014-08-11 MED ORDER — BORTEZOMIB CHEMO SQ INJECTION 3.5 MG (2.5MG/ML)
1.3000 mg/m2 | Freq: Once | INTRAMUSCULAR | Status: AC
  Administered 2014-08-11: 2.5 mg via SUBCUTANEOUS
  Filled 2014-08-11: qty 2.5

## 2014-08-11 MED ORDER — ONDANSETRON HCL 8 MG PO TABS
8.0000 mg | ORAL_TABLET | Freq: Once | ORAL | Status: AC
  Administered 2014-08-11: 8 mg via ORAL

## 2014-08-11 MED ORDER — ONDANSETRON HCL 8 MG PO TABS
ORAL_TABLET | ORAL | Status: AC
  Filled 2014-08-11: qty 1

## 2014-08-11 NOTE — Patient Instructions (Signed)
[facility] Discharge Instructions for Patients Receiving Chemotherapy  Today you received the following chemotherapy agents: Velcade  To help prevent nausea and vomiting after your treatment, we encourage you to take your nausea medication as prescribed by your physician.   If you develop nausea and vomiting that is not controlled by your nausea medication, call the clinic.   BELOW ARE SYMPTOMS THAT SHOULD BE REPORTED IMMEDIATELY:  *FEVER GREATER THAN 100.5 F  *CHILLS WITH OR WITHOUT FEVER  NAUSEA AND VOMITING THAT IS NOT CONTROLLED WITH YOUR NAUSEA MEDICATION  *UNUSUAL SHORTNESS OF BREATH  *UNUSUAL BRUISING OR BLEEDING  TENDERNESS IN MOUTH AND THROAT WITH OR WITHOUT PRESENCE OF ULCERS  *URINARY PROBLEMS  *BOWEL PROBLEMS  UNUSUAL RASH Items with * indicate a potential emergency and should be followed up as soon as possible.  Feel free to call the clinic you have any questions or concerns. The clinic phone number is [phone].    

## 2014-08-12 LAB — IGG, IGA, IGM
IGG (IMMUNOGLOBIN G), SERUM: 298 mg/dL — AB (ref 690–1700)
IGM, SERUM: 7 mg/dL — AB (ref 52–322)
IgA: 878 mg/dL — ABNORMAL HIGH (ref 69–380)

## 2014-08-14 ENCOUNTER — Ambulatory Visit (HOSPITAL_BASED_OUTPATIENT_CLINIC_OR_DEPARTMENT_OTHER): Payer: Medicare Other

## 2014-08-14 ENCOUNTER — Telehealth: Payer: Self-pay | Admitting: *Deleted

## 2014-08-14 ENCOUNTER — Other Ambulatory Visit (HOSPITAL_BASED_OUTPATIENT_CLINIC_OR_DEPARTMENT_OTHER): Payer: Medicare Other

## 2014-08-14 ENCOUNTER — Ambulatory Visit: Payer: Medicare Other

## 2014-08-14 ENCOUNTER — Telehealth: Payer: Self-pay | Admitting: Oncology

## 2014-08-14 ENCOUNTER — Other Ambulatory Visit: Payer: Self-pay | Admitting: *Deleted

## 2014-08-14 ENCOUNTER — Ambulatory Visit (HOSPITAL_BASED_OUTPATIENT_CLINIC_OR_DEPARTMENT_OTHER): Payer: Medicare Other | Admitting: Oncology

## 2014-08-14 ENCOUNTER — Encounter: Payer: Self-pay | Admitting: *Deleted

## 2014-08-14 VITALS — BP 143/73 | HR 80 | Temp 97.9°F | Resp 18 | Ht 62.0 in | Wt 175.4 lb

## 2014-08-14 DIAGNOSIS — Z5112 Encounter for antineoplastic immunotherapy: Secondary | ICD-10-CM

## 2014-08-14 DIAGNOSIS — D62 Acute posthemorrhagic anemia: Secondary | ICD-10-CM

## 2014-08-14 DIAGNOSIS — IMO0002 Reserved for concepts with insufficient information to code with codable children: Secondary | ICD-10-CM

## 2014-08-14 DIAGNOSIS — E1165 Type 2 diabetes mellitus with hyperglycemia: Secondary | ICD-10-CM

## 2014-08-14 DIAGNOSIS — C9000 Multiple myeloma not having achieved remission: Secondary | ICD-10-CM

## 2014-08-14 DIAGNOSIS — IMO0001 Reserved for inherently not codable concepts without codable children: Secondary | ICD-10-CM

## 2014-08-14 DIAGNOSIS — Z86718 Personal history of other venous thrombosis and embolism: Secondary | ICD-10-CM

## 2014-08-14 LAB — COMPREHENSIVE METABOLIC PANEL (CC13)
ALT: 18 U/L (ref 0–55)
ANION GAP: 11 meq/L (ref 3–11)
AST: 13 U/L (ref 5–34)
Albumin: 2.9 g/dL — ABNORMAL LOW (ref 3.5–5.0)
Alkaline Phosphatase: 70 U/L (ref 40–150)
BILIRUBIN TOTAL: 0.39 mg/dL (ref 0.20–1.20)
BUN: 22.9 mg/dL (ref 7.0–26.0)
CO2: 29 meq/L (ref 22–29)
Calcium: 9 mg/dL (ref 8.4–10.4)
Chloride: 100 mEq/L (ref 98–109)
Creatinine: 1.1 mg/dL (ref 0.6–1.1)
Glucose: 219 mg/dl — ABNORMAL HIGH (ref 70–140)
Potassium: 4.4 mEq/L (ref 3.5–5.1)
SODIUM: 139 meq/L (ref 136–145)
Total Protein: 6.6 g/dL (ref 6.4–8.3)

## 2014-08-14 LAB — CBC WITH DIFFERENTIAL/PLATELET
BASO%: 0.5 % (ref 0.0–2.0)
BASOS ABS: 0 10*3/uL (ref 0.0–0.1)
EOS ABS: 0.1 10*3/uL (ref 0.0–0.5)
EOS%: 0.6 % (ref 0.0–7.0)
HCT: 40.6 % (ref 34.8–46.6)
HGB: 13.1 g/dL (ref 11.6–15.9)
LYMPH%: 15.4 % (ref 14.0–49.7)
MCH: 27.3 pg (ref 25.1–34.0)
MCHC: 32.3 g/dL (ref 31.5–36.0)
MCV: 84.7 fL (ref 79.5–101.0)
MONO#: 1.2 10*3/uL — ABNORMAL HIGH (ref 0.1–0.9)
MONO%: 14.8 % — ABNORMAL HIGH (ref 0.0–14.0)
NEUT#: 5.8 10*3/uL (ref 1.5–6.5)
NEUT%: 68.7 % (ref 38.4–76.8)
PLATELETS: 279 10*3/uL (ref 145–400)
RBC: 4.79 10*6/uL (ref 3.70–5.45)
RDW: 16.3 % — ABNORMAL HIGH (ref 11.2–14.5)
WBC: 8.4 10*3/uL (ref 3.9–10.3)
lymph#: 1.3 10*3/uL (ref 0.9–3.3)

## 2014-08-14 MED ORDER — SODIUM CHLORIDE 0.9 % IV SOLN
Freq: Once | INTRAVENOUS | Status: AC
  Administered 2014-08-14: 09:00:00 via INTRAVENOUS

## 2014-08-14 MED ORDER — LENALIDOMIDE 10 MG PO CAPS: 10.0000 mg | ORAL_CAPSULE | Freq: Every day | ORAL | Status: DC

## 2014-08-14 MED ORDER — ONDANSETRON HCL 8 MG PO TABS
ORAL_TABLET | ORAL | Status: AC
  Filled 2014-08-14: qty 1

## 2014-08-14 MED ORDER — ZOLEDRONIC ACID 4 MG/5ML IV CONC
3.5000 mg | Freq: Once | INTRAVENOUS | Status: AC
  Administered 2014-08-14: 3.5 mg via INTRAVENOUS
  Filled 2014-08-14: qty 4.38

## 2014-08-14 MED ORDER — ONDANSETRON HCL 8 MG PO TABS
8.0000 mg | ORAL_TABLET | Freq: Once | ORAL | Status: AC
  Administered 2014-08-14: 8 mg via ORAL

## 2014-08-14 MED ORDER — BORTEZOMIB CHEMO SQ INJECTION 3.5 MG (2.5MG/ML)
1.3000 mg/m2 | Freq: Once | INTRAMUSCULAR | Status: AC
  Administered 2014-08-14: 2.5 mg via SUBCUTANEOUS
  Filled 2014-08-14: qty 2.5

## 2014-08-14 NOTE — Patient Instructions (Signed)
[place] Discharge Instructions for Patients Receiving Chemotherapy  Today you received the following chemotherapy agents Velcade and Zometa.  To help prevent nausea and vomiting after your treatment, we encourage you to take your nausea medication as prescribed.   If you develop nausea and vomiting that is not controlled by your nausea medication, call the clinic.   BELOW ARE SYMPTOMS THAT SHOULD BE REPORTED IMMEDIATELY:  *FEVER GREATER THAN 100.5 F  *CHILLS WITH OR WITHOUT FEVER  NAUSEA AND VOMITING THAT IS NOT CONTROLLED WITH YOUR NAUSEA MEDICATION  *UNUSUAL SHORTNESS OF BREATH  *UNUSUAL BRUISING OR BLEEDING  TENDERNESS IN MOUTH AND THROAT WITH OR WITHOUT PRESENCE OF ULCERS  *URINARY PROBLEMS  *BOWEL PROBLEMS  UNUSUAL RASH Items with * indicate a potential emergency and should be followed up as soon as possible.  Feel free to call the clinic you have any questions or concerns. The clinic phone number is [phone].

## 2014-08-14 NOTE — Telephone Encounter (Signed)
pt spouse stopped by to pick up schedule. appts per 8/28 pof on schedule. lab times adjusted to coord w/tx and lb/fu foro 9/23 cx due to it was not requested per pof and does not correlate w/any other appts. lb/fu for 9/15 as requested per pof is on schedule and pt will be given next f/u appt then.

## 2014-08-14 NOTE — Telephone Encounter (Signed)
Per staff message and POF I have scheduled appts. Advised scheduler of appts. JMW  

## 2014-08-14 NOTE — Telephone Encounter (Signed)
per pof to sch pt appt-pt spouse will come back to get sch-sent email to MW to get trmts

## 2014-08-14 NOTE — Progress Notes (Signed)
[facility]  [phone] [phone]     ID: [name] OB: 06-25-1946  MR#: [id number]  [name]#:[id number]  PCP: Sue Edin, MD GYN:   SU:  OTHER MD: Sue Shanks, MD;  Sue Clement, MD  CHIEF COMPLAINT: Multiple myeloma, under active treatment CURRENT TREATMENT: dexamethasone, bortezomib, lenalidomide, ASA 325 mg/ d, zometa  MYELOMA HISTORY: From the original consult note, dated 03/24/2014:  "The patient has a history of PE following arthroscopic surgery,as well as LLE DVT requiring Coumadin therapy since October of 2014. On 4/7 she presented to the ED with a 2 day history of hemoptysis and mucous material. INR was elevated at 6 requiring FFP and Vit K with improvement of coagulopathy (INR 1.4 this morning). Anticoagulation is on hold until hemoptysis resolves.CT angiogram on 4/7 was negative for pulmonary embolus. Bilateral pneumonia was suspected, requiring IV antibiotics.  Interestingly, diffusely heterogeneous appearance to the visualized bones were seen, more notable than on a prior CT in 2010. Bone scan on 4/8 showed old fractures in posterior 8-9 th ribs, and in a lumbar XR a new lumbar spinal fracture was noted, not picked up by the bone scan. Given the mottled appearance of the CT films,workup for multiple myeloma was initiated: She had anemia in the setting of chronic disease, iron deficiency and blood loss, and mild renal insufficiency was seen with a Cr 1.62. Sodium was normal. Her Calcium however was 12.3 on admission, today at greater than 15 receiving Calcitonin 100U nasal spray, Zometa 4 mg and hydration IV. SPEP / UPEP / IFE are pending. "  Her subsequent history is as detailed below.  INTERVAL HISTORY: [name] returns today accompanied by her husband for followup of her multiple myeloma. Her daughter [name] also participated in the discussion today, by phone. Today is day 4 cycle 6 of her subcutaneous bortezomib which she  receives on days 1, 4, 8, and 11 of each 21 day cycle. She also takes oral dexamethasone, 20 mg every Tuesday and Wednesday.  She has been on zoledronic acid every 6 weeks, due again today  REVIEW OF SYSTEMS: [name] is tolerating treatment quite well. She really does not notice any change clinic only on the days that she takes the dexamethasone, which are Tuesdays and Wednesdays. Her sugars did go up a little and she brought me a reading, but the highest reading is 257 on the evening of the second day of dexamethasone. That is bearable. She has not had any problems with thrush or yeast. She' has had no side effects from the bortezomib, although she complains of some nausea sometimes. There has been no neuropathy in particular. There've been no nosebleeds on the higher dose of aspirin, namely 325 mg daily. She has developed blurred vision in her right eye. I suspect she has a cataract that is growing there. I suggested she see Dr. Dione Taylor her ophthalmologist. She is having a little bit of discomfort in her right ear. She gets low back pain when she is more active than usual, but she is doing more active and doing a little bit of cooking, which the family enjoys. Otherwise a detailed review of systems today was noncontributory  PAST MEDICAL HISTORY: Past Medical History  Diagnosis Date  . Neurofibromatosis   . Diabetes mellitus   . HTN (hypertension)   . Obesity   . Osteoarthritis, knee   . Hyperlipidemia     statin intolerant. LDL is 83  . UTI (lower urinary tract infection) 05/29/12  .  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 Pulmonary embolism [id number]    PAST SURGICAL HISTORY: Past Surgical History  Procedure Laterality Date  . Cardiac catheterization    . Tonsillectomy and adenoidectomy    . Clavicle surgery    . Abdominal hysterectomy    . Ankle fracture surgery Right   . Total knee arthroplasty Left 09/15/2013    Procedure:  TOTAL KNEE ARTHROPLASTY;  Surgeon: Sue Huger, MD;  Location: [place];  Service: Orthopedics;  Laterality: Left;    FAMILY HISTORY Family History  Problem Relation Age of Onset  . Heart disease Mother   . Arthritis Mother   . Stroke Mother   . Heart disease Son     SOCIAL HISTORY:    (reviewed 05/19/2014)  Married. Lives in [place] with husband of 56 years. 2 sons in good health.      ADVANCED DIRECTIVES:    HEALTH MAINTENANCE: (updated 05/19/2014)  History  Substance Use Topics  . Smoking status: Never Smoker   . Smokeless tobacco: Never Used  . Alcohol Use: No     Colonoscopy:  not on file   PAP: not on file   Bone density: not on file   Lipid panel: December 2014   Allergies  Allergen Reactions  . Codeine Nausea And Vomiting    Sees things  . Hydrocodone Nausea And Vomiting    Sees things  . Niaspan [Niacin Er] Nausea Only  . Robaxin [Methocarbamol] Other (See Comments)    Made patient feel funny  . Statins Nausea And Vomiting and Other (See Comments)    Myalgias/leg pain with atorvastatin, rosuvastatin, lovastatin, simvastatin  . Tetracycline Other (See Comments)    Pt doesn't remember reaction  . Zetia [Ezetimibe] Nausea And Vomiting  . Zithromax [Azithromycin] Nausea And Vomiting    Current Outpatient Prescriptions  Medication Sig Dispense Refill  . acetaminophen (TYLENOL) 325 MG tablet Take 1-2 tablets (325-650 mg total) by mouth every 4 (four) hours as needed for mild pain.      Marland Kitchen acyclovir (ZOVIRAX) 400 MG tablet Take 1 tablet (400 mg total) by mouth 2 (two) times daily. Order was written for 5 times daily but Dr. Jana Hakim said it should be bid.  60 tablet  6  . aspirin 81 MG EC tablet Take 4 tablets (325 mg total) by mouth daily.  100 tablet  6  . calcium carbonate (TUMS EX) 750 MG chewable tablet Chew 1 tablet by mouth daily.      . cetirizine (ZYRTEC) 10 MG tablet Take 10 mg by mouth daily.      Marland Kitchen dexamethasone (DECADRON) 4 MG tablet Take 5  tablets (20 mg total) by mouth as directed.  40 tablet  6  . fluconazole (DIFLUCAN) 100 MG tablet       . glimepiride (AMARYL) 2 MG tablet Take 2 tablets by mouth every morning with breakfast.  On the days that you take prednisone or have chemotherapy take another dose at the evening meal  90 tablet  1  . glucose blood (ONE TOUCH ULTRA TEST) test strip 1 each by Other route 3 (three) times daily.  100 each  12  . Lancets MISC Check glucose 3 times daily  100 each  6  . lenalidomide (REVLIMID) 10 MG capsule Take 1 capsule (10 mg total) by mouth daily.  21 capsule  4  . methocarbamol (ROBAXIN) 500 MG tablet Take 500 mg by  mouth every 6 (six) hours as needed for muscle spasms.      . metoprolol tartrate (LOPRESSOR) 25 MG tablet Take 25 mg by mouth 2 (two) times daily.      . Multiple Vitamin (MULTIVITAMIN WITH MINERALS) TABS tablet Take 1 tablet by mouth at bedtime. Centrum Silver      . Multiple Vitamins-Minerals (OCUVITE PO) Take 1 tablet by mouth daily.       . nitroGLYCERIN (NITROSTAT) 0.4 MG SL tablet Place 1 tablet (0.4 mg total) under the tongue every 5 (five) minutes as needed for chest pain (up to 3 doses).  25 tablet  3  . nystatin (MYCOSTATIN/NYSTOP) 100000 UNIT/GM POWD Apply 1 g topically 3 (three) times daily.      . polyethylene glycol (MIRALAX / GLYCOLAX) packet Take 17 g by mouth daily.      . prochlorperazine (COMPAZINE) 10 MG tablet Take 1 tablet (10 mg total) by mouth every 6 (six) hours as needed (Nausea or vomiting).  30 tablet  1  . sennosides-docusate sodium (SENOKOT-S) 8.6-50 MG tablet Take 2 tablets by mouth at bedtime.      Marland Kitchen tobramycin-dexamethasone (TOBRADEX) ophthalmic solution Place 1 drop into both eyes every 4 (four) hours while awake.  5 mL  0  . traMADol (ULTRAM) 50 MG tablet Take 1 tablet (50 mg total) by mouth every 6 (six) hours as needed for moderate pain.  30 tablet  2   No current facility-administered medications for this visit.   Facility-Administered  Medications Ordered in Other Visits  Medication Dose Route Frequency Provider Last Rate Last Dose  . 0.45 % sodium chloride infusion   Intravenous Continuous Chauncey Cruel, MD        OBJECTIVE: Middle-aged white woman who appears stated age 64 Vitals:   08/14/14 0811  BP: 143/73  Pulse: 80  Temp: 97.9 F (36.6 C)  Resp: 18     Body mass index is 32.07 kg/(m^2).    ECOG FS:1 - Symptomatic but completely ambulatory Filed Weights   08/14/14 0811  Weight: 175 lb 6.4 oz (79.561 kg)   Sclerae unicteric, pupils are round and reactive, no changes noted in the right Oropharynx clear and moist-- full upper plate--no evidence of thrush No cervical or supraclavicular adenopathy Lungs no rales or rhonchi Heart regular rate and rhythm Abd soft, obese, nontender, positive bowel sounds MSK kyphosis but no focal spinal tenderness Neuro: nonfocal, well oriented, positive affect Breasts: Deferred   LAB RESULTS: Results for ANBER, MCKIVER (MRN [id number]) as of 08/14/2014 08:12  Ref. Range 05/05/2014 09:43 05/19/2014 12:55 06/09/2014 10:23 06/30/2014 10:31 07/21/2014 10:12  M-SPIKE, % No range found 3.67 0.69 0.31 0.19 0.36  Results for BENJAMIN, MERRIHEW (MRN [id number]) as of 08/14/2014 08:12  Ref. Range 04/08/2014 11:33 05/01/2014 09:51 05/19/2014 12:55 06/30/2014 10:31 08/11/2014 10:27  IgA Latest Range: 69-380 mg/dL 7090 (H) 5570 (H) 723 (H) 209 878 (H)  Results for VELIA, PAMER (MRN [id number]) as of 08/14/2014 08:12  Ref. Range 05/05/2014 09:43 05/19/2014 12:55 06/09/2014 10:23 06/30/2014 10:31 07/21/2014 10:12  Kappa free light chain Latest Range: 0.33-1.94 mg/dL 5.91 (H) 3.82 (H) 2.90 (H) 1.67 2.71 (H)   Lab Results  Component Value Date   WBC 8.4 08/14/2014   NEUTROABS 5.8 08/14/2014   HGB 13.1 08/14/2014   HCT 40.6 08/14/2014   MCV 84.7 08/14/2014   PLT 279 08/14/2014      Chemistry      Component Value Date/Time   NA 139 08/14/2014  0759   NA 137 07/28/2014 1015   K 4.4 08/14/2014 0759    K 4.6 07/28/2014 1015   CL 98 07/28/2014 1015   CO2 29 08/14/2014 0759   CO2 25 07/28/2014 1015   BUN 22.9 08/14/2014 0759   BUN 20 07/28/2014 1015   CREATININE 1.1 08/14/2014 0759   CREATININE 0.90 07/28/2014 1015   CREATININE 0.81 12/09/2013 1000   GLU 173 11/08/2010      Component Value Date/Time   CALCIUM 9.0 08/14/2014 0759   CALCIUM 9.4 07/28/2014 1015   CALCIUM 11.1* 03/25/2014 0850   ALKPHOS 70 08/14/2014 0759   ALKPHOS 86 07/28/2014 1015   AST 13 08/14/2014 0759   AST 16 07/28/2014 1015   ALT 18 08/14/2014 0759   ALT 14 07/28/2014 1015   BILITOT 0.39 08/14/2014 0759   BILITOT 0.2* 07/28/2014 1015       STUDIES: No results found.  ASSESSMENT: The patient is a 68 y.o.[place] woman presenting with hypercalcemia, anemia and mottled bone lesions April 2015, M-spike 3.82 g, IFE showing IgA/kappa myeloma with bone marrow biopsy 03/30/2014 with 88% plasma cells, FISH  t(4,14), deletion 13 and deletion 11 (intermediate risk); initial beta-2 microglobulin 16.  (1) considered E1A11 study, but unable to tolerate anticoagulation  (2) associated issues:  (a) coagulopathy: history of post-op PE; history of bleeding with warfarin; epistaxis with  ASA 325 ms/ day; switched to 81 mg/ day with fair tolerance  (c) hypercalcemia: started zolendronate 03/26/2014, to be repeated monthly  (d) pain: compression fracture L1, rib fractures: on tylenol and tramadol  (e) immunocompromise: on acyclovir and Septra prophylaxis  (3) started dexamethasone 40 mg/ week (20 mg po on Tues and Weds April 2015); complicated by hyperglycemia with history of diabetes, typically followed by Dr. Cleta Alberts  (4) started bortezomib SQ 04/28/2014, to be given days 1,4, 8 and 11 of ech 21 day cycle  (5) starting lenalidomide first week in September  PLAN: We spent approximately 45 minutes today discussing lenalidomide , with the patient's daughter [name] participating by phone. The patient understands the possible toxicities,  side effects and complications of this agent, including concerns regarding sleepiness, constipation, and peripheral neuropathy. She is going to take the medication at bedtime and we are only going to start at 10 mg daily, 421 days, then 7 days off. I am making arrangements for this to be delivered to her home, and she will call us today she starts so we can document that.  I showed Gwyneth the data shows a little bit of an uptick in her protein levels, indicating that the tumor may be beginning to develop resistance to the bortezomib/dexamethasone treatments we are doing so far. Hopefully by adding the lenalidomide we will get things back under good control. The goal would be to eventually stopped the dexamethasone and bortezomib and continue her on maintenance lenalidomide alone.  However if we do document clear progression we will consider the carfilzomib study. We are continuing to follow her proteins every 21 days  I am delighted that she is tolerating the dexamethasone without her sugars becoming completely out of control (the highest recorded is 257 and that is on the dexamethasone day). She was taking TUMS and I suggested she stop that, since we have been concerned about hypercalcemia in the past. Her calcium currently is normal and she will receive zolendronate again today and of course every 6 weeks.  Branda has a good understanding of this plan. She agrees with it. She knows the goal  of treatment in her case is control. She will call with any problems that may develop before the next visit here. Chauncey Cruel, MD   08/14/2014 8:48 AM

## 2014-08-17 ENCOUNTER — Telehealth: Payer: Self-pay | Admitting: Oncology

## 2014-08-17 ENCOUNTER — Encounter: Payer: Self-pay | Admitting: Oncology

## 2014-08-17 ENCOUNTER — Other Ambulatory Visit: Payer: Self-pay | Admitting: *Deleted

## 2014-08-17 DIAGNOSIS — C9000 Multiple myeloma not having achieved remission: Secondary | ICD-10-CM

## 2014-08-17 NOTE — Telephone Encounter (Signed)
Spk w/Ally w/Dr. Zenia Resides office for referral set pt up for 09/02 @2 :15, spk w/pt confirming date and time for referral, pt, sending notes to Dr. Katy Fitch at [phone]...Marland KitchenMarland KitchenKJ

## 2014-08-17 NOTE — Progress Notes (Signed)
Per Misha at [facility] patient approved for Revlimid 10,000.00 08/17/14-08/17/15. Letting billing and medical records know

## 2014-08-18 ENCOUNTER — Other Ambulatory Visit (HOSPITAL_BASED_OUTPATIENT_CLINIC_OR_DEPARTMENT_OTHER): Payer: Medicare Other

## 2014-08-18 ENCOUNTER — Ambulatory Visit (HOSPITAL_BASED_OUTPATIENT_CLINIC_OR_DEPARTMENT_OTHER): Payer: Medicare Other

## 2014-08-18 VITALS — BP 149/86 | HR 84 | Temp 98.3°F | Resp 20

## 2014-08-18 DIAGNOSIS — C9000 Multiple myeloma not having achieved remission: Secondary | ICD-10-CM

## 2014-08-18 DIAGNOSIS — I82409 Acute embolism and thrombosis of unspecified deep veins of unspecified lower extremity: Secondary | ICD-10-CM

## 2014-08-18 DIAGNOSIS — Z5112 Encounter for antineoplastic immunotherapy: Secondary | ICD-10-CM

## 2014-08-18 HISTORY — DX: Acute embolism and thrombosis of unspecified deep veins of unspecified lower extremity: I82.409

## 2014-08-18 LAB — COMPREHENSIVE METABOLIC PANEL (CC13)
ALK PHOS: 70 U/L (ref 40–150)
ALT: 14 U/L (ref 0–55)
ANION GAP: 10 meq/L (ref 3–11)
AST: 14 U/L (ref 5–34)
Albumin: 2.9 g/dL — ABNORMAL LOW (ref 3.5–5.0)
BILIRUBIN TOTAL: 0.31 mg/dL (ref 0.20–1.20)
BUN: 19.6 mg/dL (ref 7.0–26.0)
CO2: 23 meq/L (ref 22–29)
Calcium: 9 mg/dL (ref 8.4–10.4)
Chloride: 101 mEq/L (ref 98–109)
Creatinine: 1 mg/dL (ref 0.6–1.1)
GLUCOSE: 234 mg/dL — AB (ref 70–140)
Potassium: 4.6 mEq/L (ref 3.5–5.1)
Sodium: 134 mEq/L — ABNORMAL LOW (ref 136–145)
Total Protein: 7.1 g/dL (ref 6.4–8.3)

## 2014-08-18 LAB — CBC WITH DIFFERENTIAL/PLATELET
BASO%: 0.2 % (ref 0.0–2.0)
BASOS ABS: 0 10*3/uL (ref 0.0–0.1)
EOS%: 0.3 % (ref 0.0–7.0)
Eosinophils Absolute: 0 10*3/uL (ref 0.0–0.5)
HEMATOCRIT: 42.5 % (ref 34.8–46.6)
HEMOGLOBIN: 13.6 g/dL (ref 11.6–15.9)
LYMPH%: 4.1 % — AB (ref 14.0–49.7)
MCH: 27 pg (ref 25.1–34.0)
MCHC: 32.1 g/dL (ref 31.5–36.0)
MCV: 84.2 fL (ref 79.5–101.0)
MONO#: 0.2 10*3/uL (ref 0.1–0.9)
MONO%: 1.8 % (ref 0.0–14.0)
NEUT%: 93.6 % — AB (ref 38.4–76.8)
NEUTROS ABS: 9.9 10*3/uL — AB (ref 1.5–6.5)
PLATELETS: 211 10*3/uL (ref 145–400)
RBC: 5.04 10*6/uL (ref 3.70–5.45)
RDW: 16.2 % — ABNORMAL HIGH (ref 11.2–14.5)
WBC: 10.6 10*3/uL — ABNORMAL HIGH (ref 3.9–10.3)
lymph#: 0.4 10*3/uL — ABNORMAL LOW (ref 0.9–3.3)

## 2014-08-18 LAB — PROTEIN ELECTROPHORESIS, SERUM
ALBUMIN ELP: 52.7 % — AB (ref 55.8–66.1)
ALPHA-1-GLOBULIN: 5 % — AB (ref 2.9–4.9)
ALPHA-2-GLOBULIN: 12.4 % — AB (ref 7.1–11.8)
Beta 2: 19.5 % — ABNORMAL HIGH (ref 3.2–6.5)
Beta Globulin: 6.8 % (ref 4.7–7.2)
Gamma Globulin: 3.6 % — ABNORMAL LOW (ref 11.1–18.8)
M-Spike, %: 0.85 g/dL
Total Protein, Serum Electrophoresis: 6 g/dL (ref 6.0–8.3)

## 2014-08-18 LAB — KAPPA/LAMBDA LIGHT CHAINS: Kappa free light chain: 5.18 mg/dL — ABNORMAL HIGH (ref 0.33–1.94)

## 2014-08-18 MED ORDER — BORTEZOMIB CHEMO SQ INJECTION 3.5 MG (2.5MG/ML)
1.3000 mg/m2 | Freq: Once | INTRAMUSCULAR | Status: AC
  Administered 2014-08-18: 2.5 mg via SUBCUTANEOUS
  Filled 2014-08-18: qty 2.5

## 2014-08-18 MED ORDER — ONDANSETRON HCL 8 MG PO TABS
8.0000 mg | ORAL_TABLET | Freq: Once | ORAL | Status: AC
  Administered 2014-08-18: 8 mg via ORAL

## 2014-08-18 MED ORDER — ONDANSETRON HCL 8 MG PO TABS
ORAL_TABLET | ORAL | Status: AC
  Filled 2014-08-18: qty 1

## 2014-08-18 NOTE — Progress Notes (Signed)
Per Dr. Jana Hakim, do not give flu shot on days patient receives chemo. Patient reports she will follow up with PCP regarding flu shot as well, and can get flu shot on weeks she does not receive velcade shot here.

## 2014-08-18 NOTE — Patient Instructions (Signed)
[facility] Discharge Instructions for Patients Receiving Chemotherapy  Today you received the following chemotherapy agents: Velcade  To help prevent nausea and vomiting after your treatment, we encourage you to take your nausea medication as prescribed by your physician.   If you develop nausea and vomiting that is not controlled by your nausea medication, call the clinic.   BELOW ARE SYMPTOMS THAT SHOULD BE REPORTED IMMEDIATELY:  *FEVER GREATER THAN 100.5 F  *CHILLS WITH OR WITHOUT FEVER  NAUSEA AND VOMITING THAT IS NOT CONTROLLED WITH YOUR NAUSEA MEDICATION  *UNUSUAL SHORTNESS OF BREATH  *UNUSUAL BRUISING OR BLEEDING  TENDERNESS IN MOUTH AND THROAT WITH OR WITHOUT PRESENCE OF ULCERS  *URINARY PROBLEMS  *BOWEL PROBLEMS  UNUSUAL RASH Items with * indicate a potential emergency and should be followed up as soon as possible.  Feel free to call the clinic you have any questions or concerns. The clinic phone number is [phone].    

## 2014-08-21 ENCOUNTER — Other Ambulatory Visit (HOSPITAL_BASED_OUTPATIENT_CLINIC_OR_DEPARTMENT_OTHER): Payer: Medicare Other

## 2014-08-21 ENCOUNTER — Ambulatory Visit (HOSPITAL_BASED_OUTPATIENT_CLINIC_OR_DEPARTMENT_OTHER): Payer: Medicare Other

## 2014-08-21 VITALS — BP 133/64 | HR 78 | Temp 98.2°F | Resp 18

## 2014-08-21 DIAGNOSIS — Z5112 Encounter for antineoplastic immunotherapy: Secondary | ICD-10-CM

## 2014-08-21 DIAGNOSIS — C9000 Multiple myeloma not having achieved remission: Secondary | ICD-10-CM

## 2014-08-21 LAB — CBC WITH DIFFERENTIAL/PLATELET
BASO%: 0.1 % (ref 0.0–2.0)
Basophils Absolute: 0 10*3/uL (ref 0.0–0.1)
EOS ABS: 0.1 10*3/uL (ref 0.0–0.5)
EOS%: 1.8 % (ref 0.0–7.0)
HEMATOCRIT: 40.4 % (ref 34.8–46.6)
HGB: 12.9 g/dL (ref 11.6–15.9)
LYMPH%: 17.3 % (ref 14.0–49.7)
MCH: 27.1 pg (ref 25.1–34.0)
MCHC: 31.9 g/dL (ref 31.5–36.0)
MCV: 84.9 fL (ref 79.5–101.0)
MONO#: 0.9 10*3/uL (ref 0.1–0.9)
MONO%: 13.7 % (ref 0.0–14.0)
NEUT%: 67.1 % (ref 38.4–76.8)
NEUTROS ABS: 4.6 10*3/uL (ref 1.5–6.5)
PLATELETS: 178 10*3/uL (ref 145–400)
RBC: 4.76 10*6/uL (ref 3.70–5.45)
RDW: 15.4 % — ABNORMAL HIGH (ref 11.2–14.5)
WBC: 6.8 10*3/uL (ref 3.9–10.3)
lymph#: 1.2 10*3/uL (ref 0.9–3.3)

## 2014-08-21 LAB — COMPREHENSIVE METABOLIC PANEL (CC13)
ALK PHOS: 64 U/L (ref 40–150)
ALT: 14 U/L (ref 0–55)
AST: 14 U/L (ref 5–34)
Albumin: 2.8 g/dL — ABNORMAL LOW (ref 3.5–5.0)
Anion Gap: 7 mEq/L (ref 3–11)
BILIRUBIN TOTAL: 0.3 mg/dL (ref 0.20–1.20)
BUN: 20.2 mg/dL (ref 7.0–26.0)
CO2: 28 mEq/L (ref 22–29)
Calcium: 8.6 mg/dL (ref 8.4–10.4)
Chloride: 102 mEq/L (ref 98–109)
Creatinine: 1.1 mg/dL (ref 0.6–1.1)
GLUCOSE: 288 mg/dL — AB (ref 70–140)
POTASSIUM: 4.6 meq/L (ref 3.5–5.1)
Sodium: 137 mEq/L (ref 136–145)
Total Protein: 6.3 g/dL — ABNORMAL LOW (ref 6.4–8.3)

## 2014-08-21 LAB — TECHNOLOGIST REVIEW

## 2014-08-21 MED ORDER — BORTEZOMIB CHEMO SQ INJECTION 3.5 MG (2.5MG/ML)
1.3000 mg/m2 | Freq: Once | INTRAMUSCULAR | Status: AC
  Administered 2014-08-21: 2.5 mg via SUBCUTANEOUS
  Filled 2014-08-21: qty 2.5

## 2014-08-21 MED ORDER — ONDANSETRON HCL 8 MG PO TABS
ORAL_TABLET | ORAL | Status: AC
  Filled 2014-08-21: qty 1

## 2014-08-21 MED ORDER — ONDANSETRON HCL 8 MG PO TABS
8.0000 mg | ORAL_TABLET | Freq: Once | ORAL | Status: AC
  Administered 2014-08-21: 8 mg via ORAL

## 2014-08-31 ENCOUNTER — Ambulatory Visit (INDEPENDENT_AMBULATORY_CARE_PROVIDER_SITE_OTHER): Payer: Medicare Other | Admitting: Podiatry

## 2014-08-31 ENCOUNTER — Encounter: Payer: Self-pay | Admitting: Podiatry

## 2014-08-31 VITALS — BP 127/73 | HR 88

## 2014-08-31 DIAGNOSIS — M79609 Pain in unspecified limb: Secondary | ICD-10-CM

## 2014-08-31 DIAGNOSIS — B351 Tinea unguium: Secondary | ICD-10-CM

## 2014-08-31 DIAGNOSIS — M79606 Pain in leg, unspecified: Secondary | ICD-10-CM

## 2014-08-31 NOTE — Progress Notes (Signed)
Subjective:  68 y.o. year old female patient presents requesting toe nails. Patient walks aided by a walker.  She is currently undergoing a cancer treatment. Diagnosed with Melanoma in her back. Her back hurts to bend down.   Objective: Dermatologic: Thick yellow deformed nails x 10.  Vascular: Pedal pulses are all palpable.  Orthopedic: Contracted lesser digits  Neurologic: All epicritic and tactile sensations grossly intact.   Assessment:  Dystrophic mycotic nails x 10.   Treatment: All mycotic nails debrided.  Return in 3 months or as needed.

## 2014-08-31 NOTE — Patient Instructions (Signed)
Seen for hypertrophic nails. All nails debrided. Return in 3 months or as needed.  

## 2014-09-01 ENCOUNTER — Telehealth: Payer: Self-pay | Admitting: Nurse Practitioner

## 2014-09-01 ENCOUNTER — Encounter: Payer: Self-pay | Admitting: Nurse Practitioner

## 2014-09-01 ENCOUNTER — Other Ambulatory Visit: Payer: Self-pay | Admitting: Emergency Medicine

## 2014-09-01 ENCOUNTER — Other Ambulatory Visit: Payer: Self-pay | Admitting: Oncology

## 2014-09-01 ENCOUNTER — Ambulatory Visit (HOSPITAL_BASED_OUTPATIENT_CLINIC_OR_DEPARTMENT_OTHER): Payer: Medicare Other | Admitting: Nurse Practitioner

## 2014-09-01 ENCOUNTER — Other Ambulatory Visit (HOSPITAL_BASED_OUTPATIENT_CLINIC_OR_DEPARTMENT_OTHER): Payer: Medicare Other

## 2014-09-01 ENCOUNTER — Ambulatory Visit (HOSPITAL_BASED_OUTPATIENT_CLINIC_OR_DEPARTMENT_OTHER): Payer: Medicare Other

## 2014-09-01 ENCOUNTER — Telehealth: Payer: Self-pay | Admitting: *Deleted

## 2014-09-01 ENCOUNTER — Ambulatory Visit: Payer: Medicare Other | Admitting: Nurse Practitioner

## 2014-09-01 VITALS — BP 148/78 | HR 90 | Temp 97.5°F | Resp 18 | Ht 62.0 in | Wt 177.5 lb

## 2014-09-01 DIAGNOSIS — E119 Type 2 diabetes mellitus without complications: Secondary | ICD-10-CM

## 2014-09-01 DIAGNOSIS — C9000 Multiple myeloma not having achieved remission: Secondary | ICD-10-CM

## 2014-09-01 DIAGNOSIS — IMO0001 Reserved for inherently not codable concepts without codable children: Secondary | ICD-10-CM

## 2014-09-01 DIAGNOSIS — E1165 Type 2 diabetes mellitus with hyperglycemia: Secondary | ICD-10-CM

## 2014-09-01 DIAGNOSIS — N899 Noninflammatory disorder of vagina, unspecified: Secondary | ICD-10-CM

## 2014-09-01 DIAGNOSIS — Z5112 Encounter for antineoplastic immunotherapy: Secondary | ICD-10-CM

## 2014-09-01 DIAGNOSIS — R300 Dysuria: Secondary | ICD-10-CM

## 2014-09-01 DIAGNOSIS — D689 Coagulation defect, unspecified: Secondary | ICD-10-CM

## 2014-09-01 DIAGNOSIS — N9089 Other specified noninflammatory disorders of vulva and perineum: Secondary | ICD-10-CM | POA: Insufficient documentation

## 2014-09-01 LAB — COMPREHENSIVE METABOLIC PANEL (CC13)
ALK PHOS: 72 U/L (ref 40–150)
ALT: 23 U/L (ref 0–55)
AST: 18 U/L (ref 5–34)
Albumin: 3 g/dL — ABNORMAL LOW (ref 3.5–5.0)
Anion Gap: 10 mEq/L (ref 3–11)
BUN: 15 mg/dL (ref 7.0–26.0)
CO2: 24 mEq/L (ref 22–29)
Calcium: 8.8 mg/dL (ref 8.4–10.4)
Chloride: 103 mEq/L (ref 98–109)
Creatinine: 0.9 mg/dL (ref 0.6–1.1)
Glucose: 297 mg/dl — ABNORMAL HIGH (ref 70–140)
Potassium: 4.5 mEq/L (ref 3.5–5.1)
SODIUM: 137 meq/L (ref 136–145)
TOTAL PROTEIN: 6.6 g/dL (ref 6.4–8.3)
Total Bilirubin: 0.47 mg/dL (ref 0.20–1.20)

## 2014-09-01 LAB — URINALYSIS, MICROSCOPIC - CHCC
Bilirubin (Urine): NEGATIVE
Glucose: 500 mg/dL
KETONES: NEGATIVE mg/dL
Nitrite: NEGATIVE
SPECIFIC GRAVITY, URINE: 1.02 (ref 1.003–1.035)
UROBILINOGEN UR: 0.2 mg/dL (ref 0.2–1)
pH: 6 (ref 4.6–8.0)

## 2014-09-01 LAB — CBC WITH DIFFERENTIAL/PLATELET
BASO%: 0.2 % (ref 0.0–2.0)
Basophils Absolute: 0 10*3/uL (ref 0.0–0.1)
EOS ABS: 0 10*3/uL (ref 0.0–0.5)
EOS%: 0.3 % (ref 0.0–7.0)
HCT: 40.6 % (ref 34.8–46.6)
HGB: 12.9 g/dL (ref 11.6–15.9)
LYMPH%: 3.8 % — ABNORMAL LOW (ref 14.0–49.7)
MCH: 27.1 pg (ref 25.1–34.0)
MCHC: 31.7 g/dL (ref 31.5–36.0)
MCV: 85.5 fL (ref 79.5–101.0)
MONO#: 0.2 10*3/uL (ref 0.1–0.9)
MONO%: 2.4 % (ref 0.0–14.0)
NEUT%: 93.3 % — ABNORMAL HIGH (ref 38.4–76.8)
NEUTROS ABS: 9.1 10*3/uL — AB (ref 1.5–6.5)
Platelets: 246 10*3/uL (ref 145–400)
RBC: 4.75 10*6/uL (ref 3.70–5.45)
RDW: 16.1 % — AB (ref 11.2–14.5)
WBC: 9.8 10*3/uL (ref 3.9–10.3)
lymph#: 0.4 10*3/uL — ABNORMAL LOW (ref 0.9–3.3)

## 2014-09-01 MED ORDER — ONDANSETRON HCL 8 MG PO TABS
ORAL_TABLET | ORAL | Status: AC
  Filled 2014-09-01: qty 1

## 2014-09-01 MED ORDER — ONDANSETRON HCL 8 MG PO TABS
8.0000 mg | ORAL_TABLET | Freq: Once | ORAL | Status: AC
  Administered 2014-09-01: 8 mg via ORAL

## 2014-09-01 MED ORDER — BORTEZOMIB CHEMO SQ INJECTION 3.5 MG (2.5MG/ML)
1.3000 mg/m2 | Freq: Once | INTRAMUSCULAR | Status: AC
  Administered 2014-09-01: 2.5 mg via SUBCUTANEOUS
  Filled 2014-09-01: qty 2.5

## 2014-09-01 NOTE — Progress Notes (Signed)
[facility]  [phone]) [phone] Fax:(336) [phone]     ID: [name] OB: 11-Oct-1946  MR#: [id number]  SPQ#:[id number]  PCP: Jenny Reichmann, MD GYN:   SU:  OTHER MD: Jodi Marble, MD;  Darlin Coco, MD  CHIEF COMPLAINT: Multiple myeloma, under active treatment CURRENT TREATMENT: dexamethasone, bortezomib, lenalidomide, ASA 325 mg/ d, zometa  MYELOMA HISTORY: From the original consult note, dated 03/24/2014:  "The patient has a history of PE following arthroscopic surgery,as well as LLE DVT requiring Coumadin therapy since October of 2014. On 4/7 she presented to the ED with a 2 day history of hemoptysis and mucous material. INR was elevated at 6 requiring FFP and Vit K with improvement of coagulopathy (INR 1.4 this morning). Anticoagulation is on hold until hemoptysis resolves.CT angiogram on 4/7 was negative for pulmonary embolus. Bilateral pneumonia was suspected, requiring IV antibiotics.  Interestingly, diffusely heterogeneous appearance to the visualized bones were seen, more notable than on a prior CT in 2010. Bone scan on 4/8 showed old fractures in posterior 8-9 th ribs, and in a lumbar XR a new lumbar spinal fracture was noted, not picked up by the bone scan. Given the mottled appearance of the CT films,workup for multiple myeloma was initiated: She had anemia in the setting of chronic disease, iron deficiency and blood loss, and mild renal insufficiency was seen with a Cr 1.62. Sodium was normal. Her Calcium however was 12.3 on admission, today at greater than 15 receiving Calcitonin 100U nasal spray, Zometa 4 mg and hydration IV. SPEP / UPEP / IFE are pending. "  Her subsequent history is as detailed below.  INTERVAL HISTORY: [name] returns today for follow up of her multiple myeloma, accompanied by her husband. Today is day 1, cycle 7 of her subcutaneous bortezomib which she receives on days 1, 4, 8, and 11 of each  21 day cycle. She also takes oral  dexamethasone, 20 mg every Tuesday and Wednesday.  She has been on zoledronic acid every 6 weeks, due again in early Oct. Most recently, she has begun lenalidomide starting on Sept 1st, and has instructions to take for 21 days, then go off of it for 7 days before restarting.  REVIEW OF SYSTEMS: [name]'s main complaint today is vaginal irritation and dysuria. She experiences intense burning upon urination, especially when the urine flows between her labia. She also feels pain when wiping. She denies urgency, frequency, and bladder spasm. She used a Geologist, engineering to examine herself and states that her inner labia are red and inflamed, but have no open cuts or sores. She denies discharge or an odor. She noticed this for the past week and is concerned it is tied to the start of the lenalidomide. She also noticed tingling and to the very tip of her tongue and bilateral fingertips, but this is fleeting. [name] forgot her blood sugar log, but states that she checks it 3 times per day. At least 2 nights during the past 2 weeks she has woken up in the middle of the night drenched in sweat with a blood sugar in the 30s-40s. She states that she has recently started taking an extra dose of the glimepiride in the evening if her blood sugars are over 200. She has followed up with Dr. Katy Fitch about her blurred vision, and this has improved with the eyedrops that he has prescribed her. She also has an ointment for her eyelids that help. A detailed review of systems was otherwise noncontributory.   PAST  MEDICAL HISTORY: Past Medical History  Diagnosis Date  . Neurofibromatosis   . Diabetes mellitus   . HTN (hypertension)   . Obesity   . Osteoarthritis, knee   . Hyperlipidemia     statin intolerant. LDL is 83  . UTI (lower urinary tract infection) 05/29/12  . 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 Pulmonary embolism [id number]    PAST SURGICAL HISTORY: Past  Surgical History  Procedure Laterality Date  . Cardiac catheterization    . Tonsillectomy and adenoidectomy    . Clavicle surgery    . Abdominal hysterectomy    . Ankle fracture surgery Right   . Total knee arthroplasty Left 09/15/2013    Procedure: TOTAL KNEE ARTHROPLASTY;  Surgeon: Vickey Huger, MD;  Location: [facility];  Service: Orthopedics;  Laterality: Left;    FAMILY HISTORY Family History  Problem Relation Age of Onset  . Heart disease Mother   . Arthritis Mother   . Stroke Mother   . Heart disease Son     SOCIAL HISTORY:    (reviewed 05/19/2014)  Married. Lives in [place] with husband of 49 years. 2 sons in good health.      ADVANCED DIRECTIVES:    HEALTH MAINTENANCE: (updated 05/19/2014)  History  Substance Use Topics  . Smoking status: Never Smoker   . Smokeless tobacco: Never Used  . Alcohol Use: No     Colonoscopy:  not on file   PAP: not on file   Bone density: not on file   Lipid panel: December 2014   Allergies  Allergen Reactions  . Codeine Nausea And Vomiting    Sees things  . Hydrocodone Nausea And Vomiting    Sees things  . Niaspan [Niacin Er] Nausea Only  . Robaxin [Methocarbamol] Other (See Comments)    Made patient feel funny  . Statins Nausea And Vomiting and Other (See Comments)    Myalgias/leg pain with atorvastatin, rosuvastatin, lovastatin, simvastatin  . Tetracycline Other (See Comments)    Pt doesn't remember reaction  . Zetia [Ezetimibe] Nausea And Vomiting  . Zithromax [Azithromycin] Nausea And Vomiting    Current Outpatient Prescriptions  Medication Sig Dispense Refill  . acyclovir (ZOVIRAX) 400 MG tablet Take 1 tablet (400 mg total) by mouth 2 (two) times daily. Order was written for 5 times daily but Dr. Jana Hakim said it should be bid.  60 tablet  6  . aspirin 81 MG EC tablet Take 4 tablets (325 mg total) by mouth daily.  100 tablet  6  . cetirizine (ZYRTEC) 10 MG tablet Take 10 mg by mouth daily.      Marland Kitchen dexamethasone  (DECADRON) 4 MG tablet Take 5 tablets (20 mg total) by mouth as directed.  40 tablet  6  . erythromycin ophthalmic ointment Place 1 application into both eyes 3 (three) times daily.       Marland Kitchen glimepiride (AMARYL) 2 MG tablet Take 2 tablets by mouth every morning with breakfast.  On the days that you take prednisone or have chemotherapy take another dose at the evening meal  90 tablet  1  . glucose blood (ONE TOUCH ULTRA TEST) test strip 1 each by Other route 3 (three) times daily.  100 each  12  . Lancets MISC Check glucose 3 times daily  100 each  6  . lenalidomide (REVLIMID) 10 MG capsule Take 1 capsule (10 mg total) by mouth daily.  21 capsule  4  . metoprolol tartrate (LOPRESSOR) 25 MG tablet Take 25 mg by mouth 2 (two) times daily.      . Multiple Vitamin (MULTIVITAMIN WITH MINERALS) TABS tablet Take 1 tablet by mouth at bedtime. Centrum Silver      . Multiple Vitamins-Minerals (OCUVITE PO) Take 1 tablet by mouth daily.       Marland Kitchen nystatin (MYCOSTATIN/NYSTOP) 100000 UNIT/GM POWD Apply 1 g topically 3 (three) times daily.      . prochlorperazine (COMPAZINE) 10 MG tablet Take 1 tablet (10 mg total) by mouth every 6 (six) hours as needed (Nausea or vomiting).  30 tablet  1  . traMADol (ULTRAM) 50 MG tablet Take 1 tablet (50 mg total) by mouth every 6 (six) hours as needed for moderate pain.  30 tablet  2  . acetaminophen (TYLENOL) 325 MG tablet Take 1-2 tablets (325-650 mg total) by mouth every 4 (four) hours as needed for mild pain.      . nitroGLYCERIN (NITROSTAT) 0.4 MG SL tablet Place 1 tablet (0.4 mg total) under the tongue every 5 (five) minutes as needed for chest pain (up to 3 doses).  25 tablet  3  . polyethylene glycol (MIRALAX / GLYCOLAX) packet Take 17 g by mouth daily.       No current facility-administered medications for this visit.   Facility-Administered Medications Ordered in Other Visits  Medication Dose Route Frequency Provider Last Rate Last Dose  . 0.45 % sodium chloride  infusion   Intravenous Continuous Chauncey Cruel, MD        OBJECTIVE: Middle-aged white woman who appears stated age 65 Vitals:   09/01/14 1336  BP: 148/78  Pulse: 90  Temp: 97.5 F (36.4 C)  Resp: 18     Body mass index is 32.46 kg/(m^2).    ECOG FS:1 - Symptomatic but completely ambulatory Filed Weights   09/01/14 1336  Weight: 177 lb 8 oz (80.513 kg)   Skin: warm, dry  HEENT: sclerae anicteric, conjunctivae pink, oropharynx clear. No thrush or mucositis.  Lymph Nodes: No cervical or supraclavicular lymphadenopathy  Lungs: clear to auscultation bilaterally, no rales, wheezes, or rhonci  Heart: regular rate and rhythm  Abdomen: round, soft, non tender, positive bowel sounds  Musculoskeletal: kyphosis, No focal spinal tenderness, no peripheral edema  Neuro: non focal, well oriented, positive affect    LAB RESULTS: Results for JAELENE, GARCIAGARCIA (MRN [id number]) as of 08/14/2014 08:12  Ref. Range 05/05/2014 09:43 05/19/2014 12:55 06/09/2014 10:23 06/30/2014 10:31 07/21/2014 10:12  M-SPIKE, % No range found 3.67 0.69 0.31 0.19 0.36  Results for BERNETTA, SUTLEY (MRN [id number]) as of 08/14/2014 08:12  Ref. Range 04/08/2014 11:33 05/01/2014 09:51 05/19/2014 12:55 06/30/2014 10:31 08/11/2014 10:27  IgA Latest Range: 69-380 mg/dL 7090 (H) 5570 (H) 723 (H) 209 878 (H)  Results for SPECIAL, RANES (MRN [id number]) as of 08/14/2014 08:12  Ref. Range 05/05/2014 09:43 05/19/2014 12:55 06/09/2014 10:23 06/30/2014 10:31 07/21/2014 10:12  Kappa free light chain Latest Range: 0.33-1.94 mg/dL 5.91 (H) 3.82 (H) 2.90 (H) 1.67 2.71 (H)   Lab Results  Component Value Date   WBC 9.8 09/01/2014   NEUTROABS 9.1* 09/01/2014   HGB 12.9 09/01/2014   HCT 40.6 09/01/2014   MCV 85.5 09/01/2014   PLT 246 09/01/2014      Chemistry      Component Value Date/Time   NA 137 09/01/2014 1335   NA 137 07/28/2014 1015   K 4.5 09/01/2014 1335   K 4.6 07/28/2014 1015  CL 98 07/28/2014 1015   CO2 24 09/01/2014 1335   CO2 25  07/28/2014 1015   BUN 15.0 09/01/2014 1335   BUN 20 07/28/2014 1015   CREATININE 0.9 09/01/2014 1335   CREATININE 0.90 07/28/2014 1015   CREATININE 0.81 12/09/2013 1000   GLU 173 11/08/2010      Component Value Date/Time   CALCIUM 8.8 09/01/2014 1335   CALCIUM 9.4 07/28/2014 1015   CALCIUM 11.1* 03/25/2014 0850   ALKPHOS 72 09/01/2014 1335   ALKPHOS 86 07/28/2014 1015   AST 18 09/01/2014 1335   AST 16 07/28/2014 1015   ALT 23 09/01/2014 1335   ALT 14 07/28/2014 1015   BILITOT 0.47 09/01/2014 1335   BILITOT 0.2* 07/28/2014 1015       STUDIES: No results found.  ASSESSMENT: The patient is a 68 y.o.[name] woman presenting with hypercalcemia, anemia and mottled bone lesions April 2015, M-spike 3.82 g, IFE showing IgA/kappa myeloma with bone marrow biopsy 03/30/2014 with 88% plasma cells, FISH  t(4,14), deletion 13 and deletion 11 (intermediate risk); initial beta-2 microglobulin 16.  (1) considered E1A11 study, but unable to tolerate anticoagulation  (2) associated issues:  (a) coagulopathy: history of post-op PE; history of bleeding with warfarin; epistaxis with  ASA 325 ms/ day; switched to 81 mg/ day with fair tolerance  (c) hypercalcemia: started zolendronate 03/26/2014, to be repeated monthly  (d) pain: compression fracture L1, rib fractures: on tylenol and tramadol  (e) immunocompromise: on acyclovir and Septra prophylaxis  (3) started dexamethasone 40 mg/ week (20 mg po on Tues and Weds April 7510); complicated by hyperglycemia with history of diabetes, typically followed by Dr. Everlene Farrier  (4) started bortezomib SQ 04/28/2014, to be given days 1,4, 8 and 11 of ech 21 day cycle  (5) starting lenalidomide first week in September  PLAN: We spent about 40 minutes today discussing her complaints and outlining the true side effects of lenalidomide. I do not believe her vaginal irritation is related to this drug, however Dr. Jana Hakim would like to stop it for 2 weeks to let her heal up before  we retry it. I have ordered a urinalysis an urine culture to determine if these symptoms are indeed related to a UTI. In the meantime, I advised her to follow up with her gynecologist to investigate her further.   The CBC and CMET were reviewed in detail with her, and everything continued to look stable. Her glucose today was 297, but it is worth noting that today is  "dexamethasone" day. As far a she glucose dropping at night, I advised her to stop the dose of glimepiride at night and make an appointment with Dr. Everlene Farrier to figure out a better regimen.   [name] will return in 2 weeks after being off of the lenalidomide to allow her symptoms to subside and to seek specialist treatment if necessary. We will restart this drug during that visit if she is ready to do so. She will continue with the bortezomib, dexamethasone, and zometa as scheduled. Cesiah understands and agrees with this plan. She has been encouraged to call with any issues that might arise before her    Marcelino Duster, NP   09/01/2014 4:25 PM

## 2014-09-01 NOTE — Telephone Encounter (Signed)
, °

## 2014-09-01 NOTE — Telephone Encounter (Signed)
per pof to CX trmt for 9/29 & 10/2-sent MW email to add zometa10/9

## 2014-09-01 NOTE — Telephone Encounter (Signed)
Per staff message and POF I have scheduled appts. Advised scheduler of appts. JMW  

## 2014-09-01 NOTE — Patient Instructions (Signed)
[facility] Discharge Instructions for Patients Receiving Chemotherapy  Today you received the following chemotherapy agent Velcade injection.  To help prevent nausea and vomiting after your treatment, we encourage you to take your nausea medication.   If you develop nausea and vomiting that is not controlled by your nausea medication, call the clinic.   BELOW ARE SYMPTOMS THAT SHOULD BE REPORTED IMMEDIATELY:  *FEVER GREATER THAN 100.5 F  *CHILLS WITH OR WITHOUT FEVER  NAUSEA AND VOMITING THAT IS NOT CONTROLLED WITH YOUR NAUSEA MEDICATION  *UNUSUAL SHORTNESS OF BREATH  *UNUSUAL BRUISING OR BLEEDING  TENDERNESS IN MOUTH AND THROAT WITH OR WITHOUT PRESENCE OF ULCERS  *URINARY PROBLEMS  *BOWEL PROBLEMS  UNUSUAL RASH Items with * indicate a potential emergency and should be followed up as soon as possible.  Feel free to call the clinic you have any questions or concerns. The clinic phone number is [phone].

## 2014-09-02 ENCOUNTER — Telehealth: Payer: Self-pay | Admitting: *Deleted

## 2014-09-02 ENCOUNTER — Ambulatory Visit: Payer: Medicare Other

## 2014-09-02 NOTE — Telephone Encounter (Signed)
Per staff message and POF I have scheduled appts. Advised scheduler of appts. JMW  

## 2014-09-03 ENCOUNTER — Telehealth: Payer: Self-pay | Admitting: *Deleted

## 2014-09-03 LAB — URINE CULTURE

## 2014-09-03 LAB — PROTEIN ELECTROPHORESIS, SERUM
ALBUMIN ELP: 52.6 % — AB (ref 55.8–66.1)
Alpha-1-Globulin: 6.4 % — ABNORMAL HIGH (ref 2.9–4.9)
Alpha-2-Globulin: 13.6 % — ABNORMAL HIGH (ref 7.1–11.8)
Beta 2: 17.1 % — ABNORMAL HIGH (ref 3.2–6.5)
Beta Globulin: 7 % (ref 4.7–7.2)
Gamma Globulin: 3.3 % — ABNORMAL LOW (ref 11.1–18.8)
M-SPIKE, %: 0.6 g/dL
Total Protein, Serum Electrophoresis: 6.1 g/dL (ref 6.0–8.3)

## 2014-09-03 LAB — KAPPA/LAMBDA LIGHT CHAINS
KAPPA LAMBDA RATIO: 31 — AB (ref 0.26–1.65)
Kappa free light chain: 5.58 mg/dL — ABNORMAL HIGH (ref 0.33–1.94)
Lambda Free Lght Chn: 0.18 mg/dL — ABNORMAL LOW (ref 0.57–2.63)

## 2014-09-03 NOTE — Telephone Encounter (Signed)
Spoke with Marcie Bal at Dr. Arlana Lindau office - stated to fax referral to [phone]. Patient records including recent labs and office visit notes faxed to Dr. Arlana Lindau office. Sent to HIM to scan into patient's chart.

## 2014-09-04 ENCOUNTER — Ambulatory Visit (HOSPITAL_BASED_OUTPATIENT_CLINIC_OR_DEPARTMENT_OTHER): Payer: Medicare Other

## 2014-09-04 ENCOUNTER — Other Ambulatory Visit (HOSPITAL_BASED_OUTPATIENT_CLINIC_OR_DEPARTMENT_OTHER): Payer: Medicare Other

## 2014-09-04 VITALS — BP 137/61 | HR 72 | Temp 98.5°F

## 2014-09-04 DIAGNOSIS — C9000 Multiple myeloma not having achieved remission: Secondary | ICD-10-CM

## 2014-09-04 DIAGNOSIS — R238 Other skin changes: Secondary | ICD-10-CM

## 2014-09-04 DIAGNOSIS — Z5112 Encounter for antineoplastic immunotherapy: Secondary | ICD-10-CM

## 2014-09-04 LAB — COMPREHENSIVE METABOLIC PANEL (CC13)
ALBUMIN: 2.8 g/dL — AB (ref 3.5–5.0)
ALT: 19 U/L (ref 0–55)
ANION GAP: 11 meq/L (ref 3–11)
AST: 14 U/L (ref 5–34)
Alkaline Phosphatase: 58 U/L (ref 40–150)
BUN: 20.8 mg/dL (ref 7.0–26.0)
CALCIUM: 8.7 mg/dL (ref 8.4–10.4)
CHLORIDE: 104 meq/L (ref 98–109)
CO2: 27 meq/L (ref 22–29)
CREATININE: 1 mg/dL (ref 0.6–1.1)
Glucose: 206 mg/dl — ABNORMAL HIGH (ref 70–140)
POTASSIUM: 4.2 meq/L (ref 3.5–5.1)
Sodium: 142 mEq/L (ref 136–145)
Total Bilirubin: 0.38 mg/dL (ref 0.20–1.20)
Total Protein: 6 g/dL — ABNORMAL LOW (ref 6.4–8.3)

## 2014-09-04 LAB — CBC WITH DIFFERENTIAL/PLATELET
BASO%: 0.9 % (ref 0.0–2.0)
Basophils Absolute: 0.1 10*3/uL (ref 0.0–0.1)
EOS%: 2.2 % (ref 0.0–7.0)
Eosinophils Absolute: 0.2 10*3/uL (ref 0.0–0.5)
HEMATOCRIT: 39.2 % (ref 34.8–46.6)
HGB: 12.5 g/dL (ref 11.6–15.9)
LYMPH%: 12.6 % — ABNORMAL LOW (ref 14.0–49.7)
MCH: 27.1 pg (ref 25.1–34.0)
MCHC: 31.8 g/dL (ref 31.5–36.0)
MCV: 85.2 fL (ref 79.5–101.0)
MONO#: 1.5 10*3/uL — AB (ref 0.1–0.9)
MONO%: 20.5 % — ABNORMAL HIGH (ref 0.0–14.0)
NEUT#: 4.5 10*3/uL (ref 1.5–6.5)
NEUT%: 63.8 % (ref 38.4–76.8)
Platelets: 208 10*3/uL (ref 145–400)
RBC: 4.61 10*6/uL (ref 3.70–5.45)
RDW: 16.5 % — ABNORMAL HIGH (ref 11.2–14.5)
WBC: 7.1 10*3/uL (ref 3.9–10.3)
lymph#: 0.9 10*3/uL (ref 0.9–3.3)

## 2014-09-04 MED ORDER — ONDANSETRON HCL 8 MG PO TABS
ORAL_TABLET | ORAL | Status: AC
  Filled 2014-09-04: qty 1

## 2014-09-04 MED ORDER — ONDANSETRON HCL 8 MG PO TABS
8.0000 mg | ORAL_TABLET | Freq: Once | ORAL | Status: AC
  Administered 2014-09-04: 8 mg via ORAL

## 2014-09-04 MED ORDER — NYSTATIN 100000 UNIT/GM EX POWD: 1.0000 g | Freq: Three times a day (TID) | CUTANEOUS | Status: DC | PRN

## 2014-09-04 MED ORDER — BORTEZOMIB CHEMO SQ INJECTION 3.5 MG (2.5MG/ML)
1.3000 mg/m2 | Freq: Once | INTRAMUSCULAR | Status: AC
  Administered 2014-09-04: 2.5 mg via SUBCUTANEOUS
  Filled 2014-09-04: qty 2.5

## 2014-09-04 NOTE — Patient Instructions (Signed)
[facility] Discharge Instructions for Patients Receiving Chemotherapy  Today you received the following chemotherapy agents: Velcade  To help prevent nausea and vomiting after your treatment, we encourage you to take your nausea medication: compazine 10 mg every 6 hours.   If you develop nausea and vomiting that is not controlled by your nausea medication, call the clinic.   BELOW ARE SYMPTOMS THAT SHOULD BE REPORTED IMMEDIATELY:  *FEVER GREATER THAN 100.5 F  *CHILLS WITH OR WITHOUT FEVER  NAUSEA AND VOMITING THAT IS NOT CONTROLLED WITH YOUR NAUSEA MEDICATION  *UNUSUAL SHORTNESS OF BREATH  *UNUSUAL BRUISING OR BLEEDING  TENDERNESS IN MOUTH AND THROAT WITH OR WITHOUT PRESENCE OF ULCERS  *URINARY PROBLEMS  *BOWEL PROBLEMS  UNUSUAL RASH Items with * indicate a potential emergency and should be followed up as soon as possible.  Feel free to call the clinic you have any questions or concerns. The clinic phone number is [phone].

## 2014-09-08 ENCOUNTER — Other Ambulatory Visit (HOSPITAL_BASED_OUTPATIENT_CLINIC_OR_DEPARTMENT_OTHER): Payer: Medicare Other

## 2014-09-08 ENCOUNTER — Other Ambulatory Visit: Payer: Self-pay | Admitting: *Deleted

## 2014-09-08 ENCOUNTER — Ambulatory Visit (HOSPITAL_BASED_OUTPATIENT_CLINIC_OR_DEPARTMENT_OTHER): Payer: Medicare Other

## 2014-09-08 VITALS — BP 147/78 | HR 77 | Temp 98.0°F

## 2014-09-08 DIAGNOSIS — Z5112 Encounter for antineoplastic immunotherapy: Secondary | ICD-10-CM

## 2014-09-08 DIAGNOSIS — C9000 Multiple myeloma not having achieved remission: Secondary | ICD-10-CM

## 2014-09-08 LAB — COMPREHENSIVE METABOLIC PANEL (CC13)
ALBUMIN: 2.7 g/dL — AB (ref 3.5–5.0)
ALK PHOS: 70 U/L (ref 40–150)
ALT: 13 U/L (ref 0–55)
ANION GAP: 10 meq/L (ref 3–11)
AST: 13 U/L (ref 5–34)
BUN: 18.6 mg/dL (ref 7.0–26.0)
CO2: 25 meq/L (ref 22–29)
Calcium: 8.5 mg/dL (ref 8.4–10.4)
Chloride: 104 mEq/L (ref 98–109)
Creatinine: 1 mg/dL (ref 0.6–1.1)
GLUCOSE: 248 mg/dL — AB (ref 70–140)
POTASSIUM: 4.2 meq/L (ref 3.5–5.1)
Sodium: 139 mEq/L (ref 136–145)
Total Bilirubin: 0.38 mg/dL (ref 0.20–1.20)
Total Protein: 6.3 g/dL — ABNORMAL LOW (ref 6.4–8.3)

## 2014-09-08 LAB — CBC WITH DIFFERENTIAL/PLATELET
BASO%: 0.5 % (ref 0.0–2.0)
BASOS ABS: 0 10*3/uL (ref 0.0–0.1)
EOS%: 0.8 % (ref 0.0–7.0)
Eosinophils Absolute: 0.1 10*3/uL (ref 0.0–0.5)
HCT: 39.2 % (ref 34.8–46.6)
HEMOGLOBIN: 12.4 g/dL (ref 11.6–15.9)
LYMPH%: 6.8 % — ABNORMAL LOW (ref 14.0–49.7)
MCH: 27 pg (ref 25.1–34.0)
MCHC: 31.8 g/dL (ref 31.5–36.0)
MCV: 85 fL (ref 79.5–101.0)
MONO#: 0.6 10*3/uL (ref 0.1–0.9)
MONO%: 7.7 % (ref 0.0–14.0)
NEUT%: 84.2 % — ABNORMAL HIGH (ref 38.4–76.8)
NEUTROS ABS: 6.1 10*3/uL (ref 1.5–6.5)
PLATELETS: 193 10*3/uL (ref 145–400)
RBC: 4.61 10*6/uL (ref 3.70–5.45)
RDW: 16.4 % — AB (ref 11.2–14.5)
WBC: 7.2 10*3/uL (ref 3.9–10.3)
lymph#: 0.5 10*3/uL — ABNORMAL LOW (ref 0.9–3.3)

## 2014-09-08 MED ORDER — ONDANSETRON HCL 8 MG PO TABS
ORAL_TABLET | ORAL | Status: AC
  Filled 2014-09-08: qty 1

## 2014-09-08 MED ORDER — ONDANSETRON HCL 8 MG PO TABS
8.0000 mg | ORAL_TABLET | Freq: Once | ORAL | Status: AC
  Administered 2014-09-08: 8 mg via ORAL

## 2014-09-08 MED ORDER — BORTEZOMIB CHEMO SQ INJECTION 3.5 MG (2.5MG/ML)
1.3000 mg/m2 | Freq: Once | INTRAMUSCULAR | Status: AC
  Administered 2014-09-08: 2.5 mg via SUBCUTANEOUS
  Filled 2014-09-08: qty 2.5

## 2014-09-08 NOTE — Patient Instructions (Signed)
[facility] Discharge Instructions for Patients Receiving Chemotherapy  Today you received the following chemotherapy agents: Velcade  To help prevent nausea and vomiting after your treatment, we encourage you to take your nausea medication: compazine 10 mg every 6 hours.   If you develop nausea and vomiting that is not controlled by your nausea medication, call the clinic.   BELOW ARE SYMPTOMS THAT SHOULD BE REPORTED IMMEDIATELY:  *FEVER GREATER THAN 100.5 F  *CHILLS WITH OR WITHOUT FEVER  NAUSEA AND VOMITING THAT IS NOT CONTROLLED WITH YOUR NAUSEA MEDICATION  *UNUSUAL SHORTNESS OF BREATH  *UNUSUAL BRUISING OR BLEEDING  TENDERNESS IN MOUTH AND THROAT WITH OR WITHOUT PRESENCE OF ULCERS  *URINARY PROBLEMS  *BOWEL PROBLEMS  UNUSUAL RASH Items with * indicate a potential emergency and should be followed up as soon as possible.  Feel free to call the clinic you have any questions or concerns. The clinic phone number is [phone].

## 2014-09-08 NOTE — Telephone Encounter (Signed)
THIS REFILL REQUEST FOR REVLIMID WAS GIVEN TO DR.MAGRINAT'S NURSE, VAL DODD,RN.

## 2014-09-09 ENCOUNTER — Ambulatory Visit: Payer: Medicare Other | Admitting: Nurse Practitioner

## 2014-09-09 ENCOUNTER — Encounter: Payer: Self-pay | Admitting: Cardiology

## 2014-09-09 ENCOUNTER — Other Ambulatory Visit: Payer: Medicare Other

## 2014-09-09 ENCOUNTER — Ambulatory Visit (INDEPENDENT_AMBULATORY_CARE_PROVIDER_SITE_OTHER): Payer: Medicare Other | Admitting: Cardiology

## 2014-09-09 VITALS — BP 126/86 | HR 80 | Ht 63.0 in | Wt 177.0 lb

## 2014-09-09 DIAGNOSIS — C9000 Multiple myeloma not having achieved remission: Secondary | ICD-10-CM

## 2014-09-09 DIAGNOSIS — I259 Chronic ischemic heart disease, unspecified: Secondary | ICD-10-CM

## 2014-09-09 DIAGNOSIS — I119 Hypertensive heart disease without heart failure: Secondary | ICD-10-CM

## 2014-09-09 DIAGNOSIS — IMO0001 Reserved for inherently not codable concepts without codable children: Secondary | ICD-10-CM

## 2014-09-09 DIAGNOSIS — I10 Essential (primary) hypertension: Secondary | ICD-10-CM

## 2014-09-09 DIAGNOSIS — I2119 ST elevation (STEMI) myocardial infarction involving other coronary artery of inferior wall: Secondary | ICD-10-CM

## 2014-09-09 DIAGNOSIS — I2111 ST elevation (STEMI) myocardial infarction involving right coronary artery: Secondary | ICD-10-CM

## 2014-09-09 DIAGNOSIS — E1165 Type 2 diabetes mellitus with hyperglycemia: Secondary | ICD-10-CM

## 2014-09-09 NOTE — Assessment & Plan Note (Signed)
The patient has had no recurrent angina pectoris.

## 2014-09-09 NOTE — Progress Notes (Signed)
Steele Berg Date of Birth:  1946/01/06 [facility] [address] [place], [place]  [address] [phone]        Fax   [phone]   History of Present Illness: This pleasant 68 year old woman is seen for a scheduled followup office visit.  She continues on chemotherapy for multiple myeloma.  Dr. Griffith Citron is her oncologist.   She has a prior history of known ischemic heart disease. On 05/29/12 she presented with acute chest pain and had a STEMI and was taken to the Cath Lab by Dr. Irish Lack who placed a drug-eluting stent into an occluded right coronary artery. She was on dual antiplatelet therapy with Brilinta and aspirin for more than one year. Her Brilinta was stopped in anticipation of  left total knee surgery and she is continued on aspirin. The patient also has a history of hypercholesterolemia, diabetes, and neurofibromatosis. The patient underwent left total knee replacement by Dr. Ronnie Derby on 09/15/13. 2 days later she had acute dyspnea and was found to have pulmonary emboli on 09/17/13. Venous duplex of her lower extremities was negative for deep vein thrombosis.  She was treated with Coumadin but it had to be stopped subsequently because of severe intractable epistaxis.  At the present time she is on just 81 mg aspirin.  She has had no further problems with epistaxis.  He has had no symptoms to suggest recurrent pulmonary emboli.  She is ambulatory with a walker.  She uses a walker for balance.  Current Outpatient Prescriptions  Medication Sig Dispense Refill  . acetaminophen (TYLENOL) 325 MG tablet Take 1-2 tablets (325-650 mg total) by mouth every 4 (four) hours as needed for mild pain.      Marland Kitchen acyclovir (ZOVIRAX) 400 MG tablet Take 1 tablet (400 mg total) by mouth 2 (two) times daily. Order was written for 5 times daily but Dr. Jana Hakim said it should be bid.  60 tablet  6  . aspirin 81 MG EC tablet Take 4 tablets (325 mg total) by mouth daily.  100 tablet  6  .  cetirizine (ZYRTEC) 10 MG tablet Take 10 mg by mouth daily.      Marland Kitchen dexamethasone (DECADRON) 4 MG tablet Take 5 tablets (20 mg total) by mouth as directed.  40 tablet  6  . erythromycin ophthalmic ointment Place 1 application into both eyes 3 (three) times daily.       Marland Kitchen glimepiride (AMARYL) 2 MG tablet Take 2 tablets by mouth every morning with breakfast.  On the days that you take prednisone or have chemotherapy take another dose at the evening meal  90 tablet  1  . glucose blood (ONE TOUCH ULTRA TEST) test strip 1 each by Other route 3 (three) times daily.  100 each  12  . Lancets MISC Check glucose 3 times daily  100 each  6  . lenalidomide (REVLIMID) 10 MG capsule Take 1 capsule (10 mg total) by mouth daily.  21 capsule  4  . metoprolol tartrate (LOPRESSOR) 25 MG tablet Take 25 mg by mouth 2 (two) times daily.      . Multiple Vitamin (MULTIVITAMIN WITH MINERALS) TABS tablet Take 1 tablet by mouth at bedtime. Centrum Silver      . Multiple Vitamins-Minerals (OCUVITE PO) Take 1 tablet by mouth daily.       . nitroGLYCERIN (NITROSTAT) 0.4 MG SL tablet Place 1 tablet (0.4 mg total) under the tongue every 5 (five) minutes as needed for chest  pain (up to 3 doses).  25 tablet  3  . nystatin (MYCOSTATIN/NYSTOP) 100000 UNIT/GM POWD Apply 1 g topically 3 (three) times daily as needed.  30 g  1  . polyethylene glycol (MIRALAX / GLYCOLAX) packet Take 17 g by mouth daily.      . prochlorperazine (COMPAZINE) 10 MG tablet Take 1 tablet (10 mg total) by mouth every 6 (six) hours as needed (Nausea or vomiting).  30 tablet  1  . tobramycin-dexamethasone (TOBRADEX) ophthalmic solution       . traMADol (ULTRAM) 50 MG tablet Take 1 tablet (50 mg total) by mouth every 6 (six) hours as needed for moderate pain.  30 tablet  2   No current facility-administered medications for this visit.    Allergies  Allergen Reactions  . Codeine Nausea And Vomiting    Sees things  . Hydrocodone Nausea And Vomiting    Sees  things  . Niaspan [Niacin Er] Nausea Only  . Robaxin [Methocarbamol] Other (See Comments)    Made patient feel funny  . Statins Nausea And Vomiting and Other (See Comments)    Myalgias/leg pain with atorvastatin, rosuvastatin, lovastatin, simvastatin  . Tetracycline Other (See Comments)    Pt doesn't remember reaction  . Zetia [Ezetimibe] Nausea And Vomiting  . Zithromax [Azithromycin] Nausea And Vomiting    Patient Active Problem List   Diagnosis Date Noted  . Fatigue 03/10/2011    Priority: High  . Chest pain at rest 03/10/2011    Priority: High  . Acute sinusitis, unspecified 03/10/2011    Priority: High  . HTN (hypertension)     Priority: Medium  . Dysuria 09/01/2014  . Labial irritation 09/01/2014  . Pain in lower limb 06/05/2014  . Leg pain, left 05/19/2014  . Epistaxis 04/14/2014  . Multiple myeloma, without mention of having achieved remission 04/07/2014  . Physical deconditioning 03/31/2014  . Hypercalcemia 03/25/2014  . Major hemoptysis 03/25/2014  . Acute blood loss anemia 03/25/2014  . Vertebral fracture, closed 03/25/2014  . Pneumonia, organism unspecified 03/24/2014  . Encounter for therapeutic drug monitoring 01/14/2014  . Fe deficiency anemia 12/31/2013  . Long term (current) use of anticoagulants 09/24/2013  . Pulmonary embolism 09/18/2013  . Acute pulmonary embolism 09/17/2013  . Hypoxemia 09/17/2013  . Onychomycosis 04/29/2013  . Pain in joint, ankle and foot 04/29/2013  . Benign hypertensive heart disease without heart failure 10/15/2012  . STEMI (ST elevation myocardial infarction) 05/30/2012  . Dyspnea 11/15/2011  . Herpes zoster 07/17/2011  . Neurofibromatosis   . Type II or unspecified type diabetes mellitus without mention of complication, uncontrolled   . Obesity   . Osteoarthritis, knee     History  Smoking status  . Never Smoker   Smokeless tobacco  . Never Used    History  Alcohol Use No    Family History  Problem Relation  Age of Onset  . Heart disease Mother   . Arthritis Mother   . Stroke Mother   . Heart disease Son     Review of Systems: Constitutional: no fever chills diaphoresis or fatigue or change in weight.  Head and neck: no hearing loss, no epistaxis, no photophobia or visual disturbance. Respiratory: No cough, shortness of breath or wheezing. Cardiovascular: No chest pain peripheral edema, palpitations. Gastrointestinal: No abdominal distention, no abdominal pain, no change in bowel habits hematochezia or melena. Genitourinary: No dysuria, no frequency, no urgency, no nocturia. Musculoskeletal:No arthralgias, no back pain, no gait disturbance or myalgias. Neurological: No dizziness,  no headaches, no numbness, no seizures, no syncope, no weakness, no tremors. Hematologic: No lymphadenopathy, no easy bruising. Psychiatric: No confusion, no hallucinations, no sleep disturbance.    Physical Exam: Filed Vitals:   09/09/14 1053  BP: 126/86  Pulse: 80   the general appearance reveals a chronically ill-appearing woman who is the appearance of significant weight loss.  Her weight is down 25 pounds since January 2015.The head and neck exam reveals pupils equal and reactive.  Extraocular movements are full.  There is no scleral icterus.  The mouth and pharynx are normal.  The neck is supple.  The carotids reveal no bruits.  The jugular venous pressure is normal.  The  thyroid is not enlarged.  There is no lymphadenopathy.  The chest is clear to percussion and auscultation.  There are no rales or rhonchi.  Expansion of the chest is symmetrical.  The precordium is quiet.  The first heart sound is normal.  The second heart sound is physiologically split.  There is no murmur gallop rub or click.  There is no abnormal lift or heave.  The abdomen is soft and nontender.  The bowel sounds are normal.  The liver and spleen are not enlarged.  There are no abdominal masses.  There are no abdominal bruits.  Extremities  reveal good pedal pulses.  There is no phlebitis or edema.  There is no cyanosis or clubbing.  Strength is normal and symmetrical in all extremities.  There is no lateralizing weakness.  There are no sensory deficits.  The skin is warm and dry.  There is no rash.  The skin lesions of neurofibromatosis.  EKG shows normal sinus rhythm with occasional PVC.  There is a pattern of an old inferior wall myocardial infarction.  Since 04/10/14, no significant change.   Assessment / Plan: 1. ischemic heart disease status post acute inferior wall myocardial infarction on 05/29/12 requiring a drug-eluting stent. 2. multiple myeloma 3. diabetes mellitus 4. 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 previous postoperative pulmonary embolism after knee surgery in October 2014.  Plan: Continue same medication.  Recheck in 4 months for office visit.

## 2014-09-09 NOTE — Patient Instructions (Signed)
Your physician recommends that you continue on your current medications as directed. Please refer to the Current Medication list given to you today.  Your physician wants you to follow-up in: 4 MONTH OV You will receive a reminder letter in the mail two months in advance. If you don't receive a letter, please call our office to schedule the follow-up appointment.  

## 2014-09-09 NOTE — Assessment & Plan Note (Signed)
Her weight is up 8 pounds since we last saw her.  She has been having some episodes of hypoglycemia.  She will be seeing her PCP soon to discuss this further.

## 2014-09-09 NOTE — Assessment & Plan Note (Signed)
Her blood pressure is stable on current medication.  She is not having any symptoms of congestive heart failure.

## 2014-09-11 ENCOUNTER — Ambulatory Visit (HOSPITAL_BASED_OUTPATIENT_CLINIC_OR_DEPARTMENT_OTHER): Payer: Medicare Other

## 2014-09-11 ENCOUNTER — Other Ambulatory Visit: Payer: Self-pay | Admitting: *Deleted

## 2014-09-11 ENCOUNTER — Other Ambulatory Visit (HOSPITAL_BASED_OUTPATIENT_CLINIC_OR_DEPARTMENT_OTHER): Payer: Medicare Other

## 2014-09-11 VITALS — BP 134/63 | HR 78 | Temp 98.1°F | Resp 16

## 2014-09-11 DIAGNOSIS — Z5112 Encounter for antineoplastic immunotherapy: Secondary | ICD-10-CM

## 2014-09-11 DIAGNOSIS — C9000 Multiple myeloma not having achieved remission: Secondary | ICD-10-CM

## 2014-09-11 LAB — COMPREHENSIVE METABOLIC PANEL (CC13)
ALT: 10 U/L (ref 0–55)
AST: 10 U/L (ref 5–34)
Albumin: 2.7 g/dL — ABNORMAL LOW (ref 3.5–5.0)
Alkaline Phosphatase: 64 U/L (ref 40–150)
Anion Gap: 9 mEq/L (ref 3–11)
BUN: 24.7 mg/dL (ref 7.0–26.0)
CHLORIDE: 105 meq/L (ref 98–109)
CO2: 26 mEq/L (ref 22–29)
CREATININE: 1 mg/dL (ref 0.6–1.1)
Calcium: 8.6 mg/dL (ref 8.4–10.4)
Glucose: 260 mg/dl — ABNORMAL HIGH (ref 70–140)
Potassium: 4.5 mEq/L (ref 3.5–5.1)
SODIUM: 140 meq/L (ref 136–145)
TOTAL PROTEIN: 6.1 g/dL — AB (ref 6.4–8.3)
Total Bilirubin: 0.28 mg/dL (ref 0.20–1.20)

## 2014-09-11 LAB — CBC WITH DIFFERENTIAL/PLATELET
BASO%: 0.7 % (ref 0.0–2.0)
Basophils Absolute: 0 10*3/uL (ref 0.0–0.1)
EOS ABS: 0 10*3/uL (ref 0.0–0.5)
EOS%: 0.1 % (ref 0.0–7.0)
HCT: 39.6 % (ref 34.8–46.6)
HGB: 12.6 g/dL (ref 11.6–15.9)
LYMPH#: 1.1 10*3/uL (ref 0.9–3.3)
LYMPH%: 21.1 % (ref 14.0–49.7)
MCH: 27.2 pg (ref 25.1–34.0)
MCHC: 31.9 g/dL (ref 31.5–36.0)
MCV: 85.3 fL (ref 79.5–101.0)
MONO#: 1.6 10*3/uL — AB (ref 0.1–0.9)
MONO%: 29 % — ABNORMAL HIGH (ref 0.0–14.0)
NEUT%: 49.1 % (ref 38.4–76.8)
NEUTROS ABS: 2.7 10*3/uL (ref 1.5–6.5)
PLATELETS: 194 10*3/uL (ref 145–400)
RBC: 4.64 10*6/uL (ref 3.70–5.45)
RDW: 16.6 % — AB (ref 11.2–14.5)
WBC: 5.4 10*3/uL (ref 3.9–10.3)

## 2014-09-11 MED ORDER — LENALIDOMIDE 10 MG PO CAPS: 10.0000 mg | ORAL_CAPSULE | Freq: Every day | ORAL | Status: DC

## 2014-09-11 MED ORDER — ONDANSETRON HCL 8 MG PO TABS
8.0000 mg | ORAL_TABLET | Freq: Once | ORAL | Status: AC
  Administered 2014-09-11: 8 mg via ORAL

## 2014-09-11 MED ORDER — BORTEZOMIB CHEMO SQ INJECTION 3.5 MG (2.5MG/ML)
1.3000 mg/m2 | Freq: Once | INTRAMUSCULAR | Status: AC
  Administered 2014-09-11: 2.5 mg via SUBCUTANEOUS
  Filled 2014-09-11: qty 2.5

## 2014-09-11 MED ORDER — ONDANSETRON HCL 8 MG PO TABS
ORAL_TABLET | ORAL | Status: AC
  Filled 2014-09-11: qty 1

## 2014-09-11 NOTE — Patient Instructions (Signed)
[facility] Discharge Instructions for Patients Receiving Chemotherapy  Today you received the following chemotherapy agents Velcade.  To help prevent nausea and vomiting after your treatment, we encourage you to take your nausea medication as directed.    If you develop nausea and vomiting that is not controlled by your nausea medication, call the clinic.   BELOW ARE SYMPTOMS THAT SHOULD BE REPORTED IMMEDIATELY:  *FEVER GREATER THAN 100.5 F  *CHILLS WITH OR WITHOUT FEVER  NAUSEA AND VOMITING THAT IS NOT CONTROLLED WITH YOUR NAUSEA MEDICATION  *UNUSUAL SHORTNESS OF BREATH  *UNUSUAL BRUISING OR BLEEDING  TENDERNESS IN MOUTH AND THROAT WITH OR WITHOUT PRESENCE OF ULCERS  *URINARY PROBLEMS  *BOWEL PROBLEMS  UNUSUAL RASH Items with * indicate a potential emergency and should be followed up as soon as possible.  Feel free to call the clinic you have any questions or concerns. The clinic phone number is [phone].    

## 2014-09-14 ENCOUNTER — Ambulatory Visit (HOSPITAL_COMMUNITY)
Admission: RE | Admit: 2014-09-14 | Discharge: 2014-09-14 | Disposition: A | Discharge status: Home / Self Care | Payer: Medicare Other | Source: Ambulatory Visit | Attending: Gastroenterology | Admitting: Gastroenterology

## 2014-09-14 ENCOUNTER — Ambulatory Visit (HOSPITAL_BASED_OUTPATIENT_CLINIC_OR_DEPARTMENT_OTHER): Payer: Medicare Other

## 2014-09-14 ENCOUNTER — Telehealth: Payer: Self-pay | Admitting: *Deleted

## 2014-09-14 ENCOUNTER — Encounter: Payer: Self-pay | Admitting: Pharmacist

## 2014-09-14 ENCOUNTER — Ambulatory Visit (HOSPITAL_BASED_OUTPATIENT_CLINIC_OR_DEPARTMENT_OTHER): Payer: Medicare Other | Admitting: Nurse Practitioner

## 2014-09-14 ENCOUNTER — Telehealth: Payer: Self-pay | Admitting: Medical Oncology

## 2014-09-14 ENCOUNTER — Telehealth: Payer: Self-pay | Admitting: Pharmacist

## 2014-09-14 ENCOUNTER — Other Ambulatory Visit: Payer: Self-pay | Admitting: *Deleted

## 2014-09-14 VITALS — BP 135/82 | HR 95 | Temp 98.3°F | Resp 17 | Ht 63.0 in | Wt 180.1 lb

## 2014-09-14 DIAGNOSIS — I82409 Acute embolism and thrombosis of unspecified deep veins of unspecified lower extremity: Secondary | ICD-10-CM

## 2014-09-14 DIAGNOSIS — I82401 Acute embolism and thrombosis of unspecified deep veins of right lower extremity: Secondary | ICD-10-CM

## 2014-09-14 DIAGNOSIS — C9000 Multiple myeloma not having achieved remission: Secondary | ICD-10-CM | POA: Diagnosis not present

## 2014-09-14 DIAGNOSIS — I82819 Embolism and thrombosis of superficial veins of unspecified lower extremities: Secondary | ICD-10-CM | POA: Insufficient documentation

## 2014-09-14 DIAGNOSIS — I824Y9 Acute embolism and thrombosis of unspecified deep veins of unspecified proximal lower extremity: Secondary | ICD-10-CM | POA: Insufficient documentation

## 2014-09-14 DIAGNOSIS — Z7901 Long term (current) use of anticoagulants: Secondary | ICD-10-CM | POA: Insufficient documentation

## 2014-09-14 DIAGNOSIS — M79609 Pain in unspecified limb: Secondary | ICD-10-CM

## 2014-09-14 DIAGNOSIS — M7989 Other specified soft tissue disorders: Secondary | ICD-10-CM

## 2014-09-14 DIAGNOSIS — R609 Edema, unspecified: Secondary | ICD-10-CM

## 2014-09-14 DIAGNOSIS — E669 Obesity, unspecified: Secondary | ICD-10-CM | POA: Diagnosis not present

## 2014-09-14 DIAGNOSIS — I824Z9 Acute embolism and thrombosis of unspecified deep veins of unspecified distal lower extremity: Secondary | ICD-10-CM | POA: Insufficient documentation

## 2014-09-14 LAB — COMPREHENSIVE METABOLIC PANEL (CC13)
ALBUMIN: 2.8 g/dL — AB (ref 3.5–5.0)
ALT: 12 U/L (ref 0–55)
ANION GAP: 9 meq/L (ref 3–11)
AST: 12 U/L (ref 5–34)
Alkaline Phosphatase: 77 U/L (ref 40–150)
BUN: 20.4 mg/dL (ref 7.0–26.0)
CALCIUM: 9.5 mg/dL (ref 8.4–10.4)
CHLORIDE: 103 meq/L (ref 98–109)
CO2: 25 meq/L (ref 22–29)
CREATININE: 0.8 mg/dL (ref 0.6–1.1)
GLUCOSE: 129 mg/dL (ref 70–140)
POTASSIUM: 4.2 meq/L (ref 3.5–5.1)
Sodium: 137 mEq/L (ref 136–145)
Total Bilirubin: 0.46 mg/dL (ref 0.20–1.20)
Total Protein: 6.9 g/dL (ref 6.4–8.3)

## 2014-09-14 LAB — CBC WITH DIFFERENTIAL/PLATELET
BASO%: 0.4 % (ref 0.0–2.0)
BASOS ABS: 0 10*3/uL (ref 0.0–0.1)
EOS ABS: 0 10*3/uL (ref 0.0–0.5)
EOS%: 0.2 % (ref 0.0–7.0)
HCT: 40.3 % (ref 34.8–46.6)
HGB: 12.8 g/dL (ref 11.6–15.9)
LYMPH#: 0.9 10*3/uL (ref 0.9–3.3)
LYMPH%: 8.1 % — ABNORMAL LOW (ref 14.0–49.7)
MCH: 26.7 pg (ref 25.1–34.0)
MCHC: 31.8 g/dL (ref 31.5–36.0)
MCV: 84.1 fL (ref 79.5–101.0)
MONO#: 2.1 10*3/uL — AB (ref 0.1–0.9)
MONO%: 18.7 % — ABNORMAL HIGH (ref 0.0–14.0)
NEUT%: 72.6 % (ref 38.4–76.8)
NEUTROS ABS: 8.2 10*3/uL — AB (ref 1.5–6.5)
Platelets: 223 10*3/uL (ref 145–400)
RBC: 4.8 10*6/uL (ref 3.70–5.45)
RDW: 16.7 % — AB (ref 11.2–14.5)
WBC: 11.3 10*3/uL — ABNORMAL HIGH (ref 3.9–10.3)

## 2014-09-14 LAB — PROTIME-INR
INR: 1 — AB (ref 2.00–3.50)
Protime: 12 Seconds (ref 10.6–13.4)

## 2014-09-14 MED ORDER — ENOXAPARIN SODIUM 120 MG/0.8ML ~~LOC~~ SOLN: 120.0000 mg | Freq: Every day | SUBCUTANEOUS | Status: DC

## 2014-09-14 MED ORDER — WARFARIN SODIUM 5 MG PO TABS: 5.0000 mg | ORAL_TABLET | Freq: Every day | ORAL | Status: DC

## 2014-09-14 MED ORDER — ENOXAPARIN SODIUM 120 MG/0.8ML ~~LOC~~ SOLN
120.0000 mg | Freq: Once | SUBCUTANEOUS | Status: AC
  Administered 2014-09-14: 120 mg via SUBCUTANEOUS
  Filled 2014-09-14: qty 0.8

## 2014-09-14 NOTE — Telephone Encounter (Signed)
Message left by pt this AM stating onset over the WE of right leg swelling and soreness.  Doppler study ordered and scheduled.  Pt aware of above.

## 2014-09-14 NOTE — Telephone Encounter (Signed)
Confirmed warfarin rx is for 1 tablet.

## 2014-09-14 NOTE — Telephone Encounter (Signed)
Pt left message on VM stating she was dispensed 1 tablet of coumadin " is that all I am supposed to take ?"  This RN noted prescription for coumadin with instructions of " take one tablet daily "- dispense amount of 1 tablet - no refills .  Per note entered by pharmacist per coumadin clinic- noted pt is to be on lovenox and coumadin-  This RN spoke with pharmacist and verified prescription - with need for great dispense amount than 1 tablet.  This RN called and spoke with pharmacist - new prescription given.  Pt contacted.

## 2014-09-14 NOTE — Progress Notes (Signed)
Bilateral lower extremity venous duplex completed.  Right:  DVT noted in the saphenofemoral junction, common femoral, profunda, femoral, popliteal, and proximal posterior tibial veins.  Superficial thrombus noted in the greater and lesser saphenous veins.  No Baker's cyst.  Left:  No evidence of DVT, superficial thrombosis, or Baker's cyst.

## 2014-09-14 NOTE — Progress Notes (Signed)
New consult to [facility] Coumadin clinic Indication: acute RLE DVT Duration: indefinite Goal INR = 2-3 PMH: multiple myeloma (on Revlimid + Decadron + Velcade).           H/o PE (dx 09/17/13; s/p L TKR)--> note her stable dose of Coumadin as of 02/2014 was 5 mg/day except 2.5 mg on Monday/Friday.           H/o intractable epistaxis on Coumadin, leading to d/c of Coumadin ultimately in April 2015.  She went on low dose ASA at that time. Risk factors for bleeding: myeloma & hypoalbuminemia Pt started on Lovenox 120 mg SQ Q24 hrs today (1st dose given at [facility]; RX sent to her pharmacy) & to begin Coumadin 5 mg daily tomorrow (9/29). She is coming 10/2 for appt w/ Susanne Borders, NP so I have sched lab at 9:45 am & first CC appt at 10 am. Kennith Center, Pharm.D., CPP 09/14/2014@2 :52 PM

## 2014-09-14 NOTE — Telephone Encounter (Signed)
I left vm for pt on cell phone # to discuss her 1st appt for INR check & Coumadin clinic at [facility]. I have sent sched request for 09/18/14 at 9:45 am for lab; 10 am for Coumadin clinic but will need to clarify w/ pt that this time is ok.  She will see Susanne Borders, NP on Fri (10/2) as well. Kennith Center, Pharm.D., CPP 09/14/2014@2 :24 PM

## 2014-09-15 ENCOUNTER — Ambulatory Visit: Payer: Medicare Other

## 2014-09-15 ENCOUNTER — Encounter: Payer: Self-pay | Admitting: Nurse Practitioner

## 2014-09-15 NOTE — Assessment & Plan Note (Signed)
Patient was diagnosed today with a right lower extremity DVT.  After a full review of Doppler results-patient was prescribed Lovenox 1.5 mg per kilogram on a daily basis as a bridge; and also has plans to initiate Coumadin 5 mg on a daily basis tomorrow.  [place] nurse performed all Lovenox teaching for both the patient and her husband today.  Have also ordered a referral to the Coumadin clinic for continual management of patient's anti-coagulation.

## 2014-09-15 NOTE — Assessment & Plan Note (Signed)
Patient will continue bortezomib/Revlimid/dexamethasone/Zometa therapy as previously directed.  She last received the bortezomib on Friday, 09/11/2014.  She is due to return for her next bortezomib on 09/18/2014.

## 2014-09-15 NOTE — Progress Notes (Signed)
[name]   Chief Complaint  Patient presents with  . DVT    HPI: [name] 68 y.o. female diagnosed with multiple myeloma.  Currently undergoing bortezomib/Revlimid/dexamethasone/Zometa therapy.  Patient called the cancer Center today requesting urgent care visit.  She states that she developed some right lower extremity edema and discomfort over this past weekend.  She denies any known injury or trauma.  Patient underwent a Doppler ultrasound of her right lower cavity this morning; and she was found to have a right lower extremity DVT.  She presents back to the [facility] for followup.  HPI  CURRENT THERAPY: Upcoming Treatment Dates - MYELOMA INDUCTION NON-TRANSPLANT CANDIDATE Bortezomib SQ / Dexamethasone q21d Days with orders from any treatment category:  09/22/2014      SCHEDULING COMMUNICATION      ondansetron (ZOFRAN) tablet 8 mg      bortezomib SQ (VELCADE) chemo injection 2.5 mg      TREATMENT CONDITIONS      [facility] NURSING COMMUNICATION 09/25/2014      SCHEDULING COMMUNICATION      ondansetron (ZOFRAN) tablet 8 mg      bortezomib SQ (VELCADE) chemo injection 2.5 mg      TREATMENT CONDITIONS      [facility] NURSING COMMUNICATION 09/29/2014      SCHEDULING COMMUNICATION      ondansetron (ZOFRAN) tablet 8 mg      bortezomib SQ (VELCADE) chemo injection 2.5 mg      TREATMENT CONDITIONS    ROS  Past Medical History  Diagnosis Date  . Neurofibromatosis   . Diabetes mellitus   . HTN (hypertension)   . Obesity   . Osteoarthritis, knee   . Hyperlipidemia     statin intolerant. LDL is 83  . UTI (lower urinary tract infection) 05/29/12  . 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 Pulmonary embolism [id number]    Past Surgical History  Procedure Laterality Date  . Cardiac catheterization    . Tonsillectomy and adenoidectomy    . Clavicle surgery    . Abdominal hysterectomy      . Ankle fracture surgery Right   . Total knee arthroplasty Left 09/15/2013    Procedure: TOTAL KNEE ARTHROPLASTY;  Surgeon: Vickey Huger, MD;  Location: [place];  Service: Orthopedics;  Laterality: Left;    has Neurofibromatosis; Type II or unspecified type diabetes mellitus without mention of complication, uncontrolled; HTN (hypertension); Obesity; Osteoarthritis, knee; Fatigue; Chest pain at rest; Acute sinusitis, unspecified; Herpes zoster; Dyspnea; STEMI (ST elevation myocardial infarction); Benign hypertensive heart disease without heart failure; Onychomycosis; Pain in joint, ankle and foot; Acute pulmonary embolism; Hypoxemia; Pulmonary embolism; Long term (current) use of anticoagulants; Fe deficiency anemia; Encounter for therapeutic drug monitoring; Pneumonia, organism unspecified; Hypercalcemia; Major hemoptysis; Acute blood loss anemia; Vertebral fracture, closed; Physical deconditioning; Multiple myeloma, without mention of having achieved remission; Epistaxis; Leg pain, left; Pain in lower limb; Dysuria; Labial irritation; and DVT (deep venous thrombosis), right on her problem list.     is allergic to codeine; hydrocodone; niaspan; robaxin; statins; tetracycline; zetia; and zithromax.    Medication List       This list is accurate as of: 09/14/14 11:59 PM.  Always use your most recent med list.               acetaminophen 325 MG tablet  Commonly known as:  TYLENOL  Take 1-2 tablets (325-650 mg  total) by mouth every 4 (four) hours as needed for mild pain.     acyclovir 400 MG tablet  Commonly known as:  ZOVIRAX  Take 1 tablet (400 mg total) by mouth 2 (two) times daily. Order was written for 5 times daily but Dr. Jana Hakim said it should be bid.     cetirizine 10 MG tablet  Commonly known as:  ZYRTEC  Take 10 mg by mouth daily.     dexamethasone 4 MG tablet  Commonly known as:  DECADRON  Take 5 tablets (20 mg total) by mouth as directed.     enoxaparin 120 MG/0.8ML  injection  Commonly known as:  LOVENOX  Inject 0.8 mLs (120 mg total) into the skin daily.     erythromycin ophthalmic ointment  Place 1 application into both eyes 3 (three) times daily.     glimepiride 2 MG tablet  Commonly known as:  AMARYL  - Take 2 tablets by mouth every morning with breakfast.   - On the days that you take prednisone or have chemotherapy take another dose at the evening meal     glucose blood test strip  Commonly known as:  ONE TOUCH ULTRA TEST  1 each by Other route 3 (three) times daily.     Lancets Misc  Check glucose 3 times daily     lenalidomide 10 MG capsule  Commonly known as:  REVLIMID  Take 1 capsule (10 mg total) by mouth daily.     metoprolol tartrate 25 MG tablet  Commonly known as:  LOPRESSOR  Take 25 mg by mouth 2 (two) times daily.     multivitamin with minerals Tabs tablet  Take 1 tablet by mouth at bedtime. Centrum Silver     nitroGLYCERIN 0.4 MG SL tablet  Commonly known as:  NITROSTAT  Place 1 tablet (0.4 mg total) under the tongue every 5 (five) minutes as needed for chest pain (up to 3 doses).     nystatin 100000 UNIT/GM Powd  Apply 1 g topically 3 (three) times daily as needed.     OCUVITE PO  Take 1 tablet by mouth daily.     polyethylene glycol packet  Commonly known as:  MIRALAX / GLYCOLAX  Take 17 g by mouth daily.     prochlorperazine 10 MG tablet  Commonly known as:  COMPAZINE  Take 1 tablet (10 mg total) by mouth every 6 (six) hours as needed (Nausea or vomiting).     tobramycin-dexamethasone ophthalmic solution  Commonly known as:  TOBRADEX     traMADol 50 MG tablet  Commonly known as:  ULTRAM  Take 1 tablet (50 mg total) by mouth every 6 (six) hours as needed for moderate pain.     warfarin 5 MG tablet  Commonly known as:  COUMADIN  Take 1 tablet (5 mg total) by mouth daily.         PHYSICAL EXAMINATION  Blood pressure 135/82, pulse 95, temperature 98.3 F (36.8 C), temperature source Oral, resp.  rate 17, height $RemoveBe'5\' 3"'NfdNeRhtv$  (1.6 m), weight 180 lb 1.6 oz (81.693 kg), SpO2 98.00%.  Physical Exam  Nursing note and vitals reviewed. Constitutional: She is oriented to person, place, and time and well-developed, well-nourished, and in no distress. No distress.  HENT:  Head: Normocephalic.  Eyes: Pupils are equal, round, and reactive to light.  Neck: Normal range of motion.  Cardiovascular: Normal rate, regular rhythm, normal heart sounds and intact distal pulses.   Pulmonary/Chest: Effort normal and breath sounds  normal. No respiratory distress.  Abdominal: Soft. Bowel sounds are normal. She exhibits no distension. There is no tenderness.  Musculoskeletal: Normal range of motion. She exhibits edema. She exhibits no tenderness.  Right lower extremity edematous; but all pulses are palpable in all extremities are warm.  Patient ambulate with no difficulty whatsoever.  Neurological: She is alert and oriented to person, place, and time. Gait normal.  Skin: Skin is warm and dry. No rash noted. No erythema.  Psychiatric: Affect normal.    LABORATORY DATA:. CBC  Lab Results  Component Value Date   WBC 11.3* 09/14/2014   RBC 4.80 09/14/2014   HGB 12.8 09/14/2014   HCT 40.3 09/14/2014   PLT 223 09/14/2014   MCV 84.1 09/14/2014   MCH 26.7 09/14/2014   MCHC 31.8 09/14/2014   RDW 16.7* 09/14/2014   LYMPHSABS 0.9 09/14/2014   MONOABS 2.1* 09/14/2014   EOSABS 0.0 09/14/2014   BASOSABS 0.0 09/14/2014     CMET  Lab Results  Component Value Date   NA 137 09/14/2014   K 4.2 09/14/2014   CL 98 07/28/2014   CO2 25 09/14/2014   GLUCOSE 129 09/14/2014   BUN 20.4 09/14/2014   CREATININE 0.8 09/14/2014   CALCIUM 9.5 09/14/2014   PROT 6.9 09/14/2014   ALBUMIN 2.8* 09/14/2014   AST 12 09/14/2014   ALT 12 09/14/2014   ALKPHOS 77 09/14/2014   BILITOT 0.46 09/14/2014   GFRNONAA 39* 04/14/2014   GFRAA 45* 04/14/2014    RADIOGRAPHIC STUDIES: Charlaine Dalton, RVT at 09/14/2014 11:41 AM     Status: Signed         Bilateral lower extremity venous duplex completed. Right: DVT noted in the saphenofemoral junction, common femoral, profunda, femoral, popliteal, and proximal posterior tibial veins. Superficial thrombus noted in the greater and lesser saphenous veins. No Baker's cyst. Left: No evidence of DVT, superficial thrombosis, or Baker's cyst.     ASSESSMENT/PLAN:    Multiple myeloma, without mention of having achieved remission  Assessment & Plan Patient will continue bortezomib/Revlimid/dexamethasone/Zometa therapy as previously directed.  She last received the bortezomib on Friday, 09/11/2014.  She is due to return for her next bortezomib on 09/18/2014.   DVT (deep venous thrombosis), right  Assessment & Plan Patient was diagnosed today with a right lower extremity DVT.  After a full review of Doppler results-patient was prescribed Lovenox 1.5 mg per kilogram on a daily basis as a bridge; and also has plans to initiate Coumadin 5 mg on a daily basis tomorrow.  [place] nurse performed all Lovenox teaching for both the patient and her husband today.  Have also ordered a referral to the Coumadin clinic for continual management of patient's anti-coagulation.    Patient stated understanding of all instructions; and was in agreement with this plan of care. The patient knows to call the clinic with any problems, questions or concerns.   Review/collaboration with Dr. Jana Hakim regarding all aspects of patient's visit today.   Total time spent with patient was 25 minutes;  with greater than 80 percent of that time spent in face to face counseling regarding her symptoms, discussion of both Lovenox and Coumadin anticoagulation, and coordination of care and follow up.  [place] nurse performed additional Lovenox teaching for both patient and her husband today.  Disclaimer: This note was dictated with voice recognition software. Similar sounding words can inadvertently be transcribed and may not be  corrected upon review.   Drue Second, NP 09/15/2014

## 2014-09-15 NOTE — Progress Notes (Signed)
Addendum: Advised patient to hold taking her baby aspirin everyday since she is initiating anticoagulation therapy.

## 2014-09-16 ENCOUNTER — Other Ambulatory Visit: Payer: Self-pay | Admitting: Cardiology

## 2014-09-16 ENCOUNTER — Other Ambulatory Visit: Payer: Self-pay | Admitting: Emergency Medicine

## 2014-09-16 NOTE — Telephone Encounter (Signed)
[facility] Medicare dept faxed notice asking for all records w/in prev 6 mos r/t pt's DM to be faxed per Medicare's request. Faxed OV notes and labs from all OVs w/in 6 mos. We have no further records.

## 2014-09-18 ENCOUNTER — Ambulatory Visit (HOSPITAL_BASED_OUTPATIENT_CLINIC_OR_DEPARTMENT_OTHER): Payer: Medicare Other | Admitting: Nurse Practitioner

## 2014-09-18 ENCOUNTER — Telehealth: Payer: Self-pay | Admitting: Nurse Practitioner

## 2014-09-18 ENCOUNTER — Other Ambulatory Visit (HOSPITAL_BASED_OUTPATIENT_CLINIC_OR_DEPARTMENT_OTHER): Payer: Medicare Other

## 2014-09-18 ENCOUNTER — Ambulatory Visit (HOSPITAL_BASED_OUTPATIENT_CLINIC_OR_DEPARTMENT_OTHER): Payer: Self-pay | Admitting: Pharmacist

## 2014-09-18 ENCOUNTER — Telehealth: Payer: Self-pay | Admitting: Oncology

## 2014-09-18 ENCOUNTER — Other Ambulatory Visit: Payer: Self-pay | Admitting: *Deleted

## 2014-09-18 ENCOUNTER — Ambulatory Visit: Payer: Medicare Other | Admitting: Nurse Practitioner

## 2014-09-18 ENCOUNTER — Other Ambulatory Visit: Payer: Medicare Other

## 2014-09-18 ENCOUNTER — Encounter: Payer: Self-pay | Admitting: Nurse Practitioner

## 2014-09-18 ENCOUNTER — Telehealth: Payer: Self-pay | Admitting: *Deleted

## 2014-09-18 ENCOUNTER — Ambulatory Visit: Payer: Medicare Other

## 2014-09-18 VITALS — BP 140/73 | HR 85 | Temp 97.5°F | Resp 18 | Ht 63.0 in | Wt 181.1 lb

## 2014-09-18 DIAGNOSIS — R197 Diarrhea, unspecified: Secondary | ICD-10-CM

## 2014-09-18 DIAGNOSIS — C9000 Multiple myeloma not having achieved remission: Secondary | ICD-10-CM

## 2014-09-18 DIAGNOSIS — I82401 Acute embolism and thrombosis of unspecified deep veins of right lower extremity: Secondary | ICD-10-CM

## 2014-09-18 DIAGNOSIS — D689 Coagulation defect, unspecified: Secondary | ICD-10-CM

## 2014-09-18 DIAGNOSIS — M8448XA Pathological fracture, other site, initial encounter for fracture: Secondary | ICD-10-CM

## 2014-09-18 LAB — COMPREHENSIVE METABOLIC PANEL (CC13)
ALBUMIN: 2.5 g/dL — AB (ref 3.5–5.0)
ALK PHOS: 68 U/L (ref 40–150)
ALT: 14 U/L (ref 0–55)
AST: 13 U/L (ref 5–34)
Anion Gap: 9 mEq/L (ref 3–11)
BUN: 21.2 mg/dL (ref 7.0–26.0)
CALCIUM: 9 mg/dL (ref 8.4–10.4)
CHLORIDE: 103 meq/L (ref 98–109)
CO2: 27 mEq/L (ref 22–29)
Creatinine: 1 mg/dL (ref 0.6–1.1)
Glucose: 220 mg/dl — ABNORMAL HIGH (ref 70–140)
Potassium: 4.4 mEq/L (ref 3.5–5.1)
SODIUM: 139 meq/L (ref 136–145)
TOTAL PROTEIN: 6.7 g/dL (ref 6.4–8.3)
Total Bilirubin: 0.28 mg/dL (ref 0.20–1.20)

## 2014-09-18 LAB — CBC WITH DIFFERENTIAL/PLATELET
BASO%: 0.1 % (ref 0.0–2.0)
BASOS ABS: 0 10*3/uL (ref 0.0–0.1)
EOS ABS: 0 10*3/uL (ref 0.0–0.5)
EOS%: 0.5 % (ref 0.0–7.0)
HCT: 38.5 % (ref 34.8–46.6)
HEMOGLOBIN: 12.3 g/dL (ref 11.6–15.9)
LYMPH#: 1.2 10*3/uL (ref 0.9–3.3)
LYMPH%: 15 % (ref 14.0–49.7)
MCH: 27.3 pg (ref 25.1–34.0)
MCHC: 31.9 g/dL (ref 31.5–36.0)
MCV: 85.6 fL (ref 79.5–101.0)
MONO#: 0.8 10*3/uL (ref 0.1–0.9)
MONO%: 10.8 % (ref 0.0–14.0)
NEUT%: 73.6 % (ref 38.4–76.8)
NEUTROS ABS: 5.6 10*3/uL (ref 1.5–6.5)
Platelets: 332 10*3/uL (ref 145–400)
RBC: 4.5 10*6/uL (ref 3.70–5.45)
RDW: 16 % — AB (ref 11.2–14.5)
WBC: 7.7 10*3/uL (ref 3.9–10.3)

## 2014-09-18 LAB — PROTIME-INR
INR: 1.3 — ABNORMAL LOW (ref 2.00–3.50)
Protime: 15.6 Seconds — ABNORMAL HIGH (ref 10.6–13.4)

## 2014-09-18 LAB — POCT INR: INR: 1.3

## 2014-09-18 NOTE — Telephone Encounter (Signed)
Per staff message and POF I have scheduled appts. Advised scheduler of appts. JMW  

## 2014-09-18 NOTE — Progress Notes (Addendum)
INR below goal today at 1.3 Pt to see Nira Conn, NP today as well Pt is currently bridging with lovenox 120 mg daily and coumadin 5 mg daily She started both on Monday 09/14/14 She has not taken coumadin this morning (she takes this in the evening) but has taken her lovenox shot She has 3 lovenox shots remaining She has only had 4 doses of coumadin so INR is likely slow to respond Will slightly boost over the weekend to get INR to trend towards goal Pt reports some bruising around lovenox injection sites She has been having some diarrhea and gas pains this week ? 2/2 steroid use? Her appetite has not been normal per patient she has not been eating well but is trying to force herself to eat She enjoys collard greens, spinach etc which are high in vitamin K Discussed this with patient and she will do her best to avoid these if possible or eat in moderation and be consistent  Plan: Continue Lovenox shot 120 mg daily. (pt only has enough shots to last through Monday dose, she will need more lovenox on Tuesday if INR not at goal) Take 7.5 mg (1.5 tabs) Today and Tomorrow.  Then continue 5 mg daily. Recheck on 09/22/14 at 10:15am for lab and 11am for infusion. We will see you in infusion

## 2014-09-18 NOTE — Telephone Encounter (Signed)
, °

## 2014-09-18 NOTE — Telephone Encounter (Signed)
per Chris to sch CC-pt aware °

## 2014-09-18 NOTE — Patient Instructions (Signed)
INR below goal.  Continue Lovenox shot 120 mg daily.  Take 7.5 mg (1.5 tabs) Today and Tomorrow.  Then continue 5 mg daily. Recheck on 09/22/14 at 10:15am for lab and 11am for infusion. We will see you in infusion

## 2014-09-18 NOTE — Progress Notes (Signed)
[facility]  [phone] [phone]) [phone]     ID: [name] OB: 23-Oct-1946  MR#: [id number]  ZJI#:[id number]  PCP: Jenny Reichmann, MD GYN:   SU:  OTHER MD: Jodi Marble, MD;  Darlin Coco, MD  CHIEF COMPLAINT: Multiple myeloma, under active treatment CURRENT TREATMENT: dexamethasone, bortezomib, lenalidomide (on hold), zometa  MYELOMA HISTORY: From the original consult note, dated 03/24/2014:  "The patient has a history of PE following arthroscopic surgery,as well as LLE DVT requiring Coumadin therapy since October of 2014. On 4/7 she presented to the ED with a 2 day history of hemoptysis and mucous material. INR was elevated at 6 requiring FFP and Vit K with improvement of coagulopathy (INR 1.4 this morning). Anticoagulation is on hold until hemoptysis resolves.CT angiogram on 4/7 was negative for pulmonary embolus. Bilateral pneumonia was suspected, requiring IV antibiotics.  Interestingly, diffusely heterogeneous appearance to the visualized bones were seen, more notable than on a prior CT in 2010. Bone scan on 4/8 showed old fractures in posterior 8-9 th ribs, and in a lumbar XR a new lumbar spinal fracture was noted, not picked up by the bone scan. Given the mottled appearance of the CT films,workup for multiple myeloma was initiated: She had anemia in the setting of chronic disease, iron deficiency and blood loss, and mild renal insufficiency was seen with a Cr 1.62. Sodium was normal. Her Calcium however was 12.3 on admission, today at greater than 15 receiving Calcitonin 100U nasal spray, Zometa 4 mg and hydration IV. SPEP / UPEP / IFE are pending. "  Her subsequent history is as detailed below.  INTERVAL HISTORY: [name] returns today for follow up of her multiple myeloma, accompanied by her husband. Today is day 18, cycle 7 of her subcutaneous bortezomib which she receives on days 1, 4, 8, and 11 of each  21 day cycle. She also takes oral  dexamethasone, 20 mg every Tuesday and Wednesday.  She has been on zoledronic acid every 6 weeks, due again in early Oct. She was on lenalidomide starting on Sept 1st, but this was held starting on Sept 15th because of possible vaginal irrigation. This has since resolved. The interval history is significant for a right lower extremity DVT for which she is currently on lovenox and coumadin, managed by the anti-coag clinic. [name]'s main complaints today include diarrhea possibly associated with acyclovir. She has a loose bowel movement 1-2 hrs a dose. The rest of the day she is fine. Imodium does help usually. In addition, she has recently gotten a bill from her insurance company, and she owe over $2,000 in out of pocket expenses for the lenalidomide.   REVIEW OF SYSTEMS: [name] denies fever or chills. She has some nausea, but this is managed with compazine well. She has no shortness of breath, chest pain, palpitations, cough, or fatigue. Her vaginal irritation and dysuria has resolved. She continues to use the eyedrops and ointments prescribed by Dr. Katy Fitch for her vision. A detailed review of systems is otherwise noncontributory.   PAST MEDICAL HISTORY: Past Medical History  Diagnosis Date  . Neurofibromatosis   . Diabetes mellitus   . HTN (hypertension)   . Obesity   . Osteoarthritis, knee   . Hyperlipidemia     statin intolerant. LDL is 83  . UTI (lower urinary tract infection) 05/29/12  . 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 Pulmonary embolism [id number]  PAST SURGICAL HISTORY: Past Surgical History  Procedure Laterality Date  . Cardiac catheterization    . Tonsillectomy and adenoidectomy    . Clavicle surgery    . Abdominal hysterectomy    . Ankle fracture surgery Right   . Total knee arthroplasty Left 09/15/2013    Procedure: TOTAL KNEE ARTHROPLASTY;  Surgeon: Vickey Huger, MD;  Location: [facility];  Service: Orthopedics;   Laterality: Left;    FAMILY HISTORY Family History  Problem Relation Age of Onset  . Heart disease Mother   . Arthritis Mother   . Stroke Mother   . Heart disease Son     SOCIAL HISTORY:    (reviewed 05/19/2014)  Married. Lives in [place] with husband of 28 years. 2 sons in good health.      ADVANCED DIRECTIVES:    HEALTH MAINTENANCE: (updated 05/19/2014)  History  Substance Use Topics  . Smoking status: Never Smoker   . Smokeless tobacco: Never Used  . Alcohol Use: No     Colonoscopy:  not on file   PAP: not on file   Bone density: not on file   Lipid panel: December 2014   Allergies  Allergen Reactions  . Codeine Nausea And Vomiting    Sees things  . Hydrocodone Nausea And Vomiting    Sees things  . Niaspan [Niacin Er] Nausea Only  . Robaxin [Methocarbamol] Other (See Comments)    Made patient feel funny  . Statins Nausea And Vomiting and Other (See Comments)    Myalgias/leg pain with atorvastatin, rosuvastatin, lovastatin, simvastatin  . Tetracycline Other (See Comments)    Pt doesn't remember reaction  . Zetia [Ezetimibe] Nausea And Vomiting  . Zithromax [Azithromycin] Nausea And Vomiting    Current Outpatient Prescriptions  Medication Sig Dispense Refill  . acetaminophen (TYLENOL) 325 MG tablet Take 1-2 tablets (325-650 mg total) by mouth every 4 (four) hours as needed for mild pain.      Marland Kitchen acyclovir (ZOVIRAX) 400 MG tablet Take 1 tablet (400 mg total) by mouth 2 (two) times daily. Order was written for 5 times daily but Dr. Jana Hakim said it should be bid.  60 tablet  6  . cetirizine (ZYRTEC) 10 MG tablet Take 10 mg by mouth daily.      Marland Kitchen dexamethasone (DECADRON) 4 MG tablet Take 5 tablets (20 mg total) by mouth as directed.  40 tablet  6  . enoxaparin (LOVENOX) 120 MG/0.8ML injection Inject 0.8 mLs (120 mg total) into the skin daily.  7 Syringe  0  . glimepiride (AMARYL) 2 MG tablet Take 2 tablets by mouth every morning with breakfast.  On the days  that you take prednisone or have chemotherapy take another dose at the evening meal  90 tablet  1  . glucose blood (ONE TOUCH ULTRA TEST) test strip 1 each by Other route 3 (three) times daily.  100 each  12  . Lancets MISC Check glucose 3 times daily  100 each  6  . metoprolol tartrate (LOPRESSOR) 25 MG tablet Take 25 mg by mouth 2 (two) times daily.      . Multiple Vitamin (MULTIVITAMIN WITH MINERALS) TABS tablet Take 1 tablet by mouth at bedtime. Centrum Silver      . Multiple Vitamins-Minerals (OCUVITE PO) Take 1 tablet by mouth daily.       . prochlorperazine (COMPAZINE) 10 MG tablet Take 1 tablet (10 mg total) by mouth every 6 (six) hours as needed (Nausea or vomiting).  30 tablet  1  .  tobramycin-dexamethasone (TOBRADEX) ophthalmic solution       . warfarin (COUMADIN) 5 MG tablet Take 1 tablet (5 mg total) by mouth daily.  1 tablet  0  . lenalidomide (REVLIMID) 10 MG capsule Take 1 capsule (10 mg total) by mouth daily.  21 capsule  4  . NITROSTAT 0.4 MG SL tablet DISSOLVE 1 TABLET UNDER THE TONGUE EVERY 5 MINUTES AS NEEDED FOR CHEST PAIN. UP TO 3 DOSES  25 tablet  0  . nystatin (MYCOSTATIN/NYSTOP) 100000 UNIT/GM POWD Apply 1 g topically 3 (three) times daily as needed.  30 g  1  . polyethylene glycol (MIRALAX / GLYCOLAX) packet Take 17 g by mouth daily.      . traMADol (ULTRAM) 50 MG tablet Take 1 tablet (50 mg total) by mouth every 6 (six) hours as needed for moderate pain.  30 tablet  2   No current facility-administered medications for this visit.    OBJECTIVE: Middle-aged white woman who appears stated age 68 Vitals:   09/18/14 1049  BP: 140/73  Pulse: 85  Temp: 97.5 F (36.4 C)  Resp: 18     Body mass index is 32.09 kg/(m^2).    ECOG FS:1 - Symptomatic but completely ambulatory Filed Weights   09/18/14 1049  Weight: 181 lb 1.6 oz (82.146 kg)   Sclerae unicteric, pupils equal and reactive Oropharynx clear and moist-- no thrush No cervical or supraclavicular  adenopathy Lungs no rales or rhonchi Heart regular rate and rhythm Abd soft, nontender, positive bowel sounds MSK no focal spinal tenderness, no upper extremity lymphedema Neuro: nonfocal, well oriented, appropriate affect   LAB RESULTS: Results for DENETRIA, LUEVANOS (MRN [id number]) as of 08/14/2014 08:12  Ref. Range 05/05/2014 09:43 05/19/2014 12:55 06/09/2014 10:23 06/30/2014 10:31 07/21/2014 10:12  M-SPIKE, % No range found 3.67 0.69 0.31 0.19 0.36  Results for MIKKA, KISSNER (MRN [id number]) as of 08/14/2014 08:12  Ref. Range 04/08/2014 11:33 05/01/2014 09:51 05/19/2014 12:55 06/30/2014 10:31 08/11/2014 10:27  IgA Latest Range: 69-380 mg/dL 7090 (H) 5570 (H) 723 (H) 209 878 (H)  Results for RAGHAD, LORENZ (MRN [id number]) as of 08/14/2014 08:12  Ref. Range 05/05/2014 09:43 05/19/2014 12:55 06/09/2014 10:23 06/30/2014 10:31 07/21/2014 10:12  Kappa free light chain Latest Range: 0.33-1.94 mg/dL 5.91 (H) 3.82 (H) 2.90 (H) 1.67 2.71 (H)   Lab Results  Component Value Date   WBC 7.7 09/18/2014   NEUTROABS 5.6 09/18/2014   HGB 12.3 09/18/2014   HCT 38.5 09/18/2014   MCV 85.6 09/18/2014   PLT 332 09/18/2014      Chemistry      Component Value Date/Time   NA 139 09/18/2014 1000   NA 137 07/28/2014 1015   K 4.4 09/18/2014 1000   K 4.6 07/28/2014 1015   CL 98 07/28/2014 1015   CO2 27 09/18/2014 1000   CO2 25 07/28/2014 1015   BUN 21.2 09/18/2014 1000   BUN 20 07/28/2014 1015   CREATININE 1.0 09/18/2014 1000   CREATININE 0.90 07/28/2014 1015   CREATININE 0.81 12/09/2013 1000   GLU 173 11/08/2010      Component Value Date/Time   CALCIUM 9.0 09/18/2014 1000   CALCIUM 9.4 07/28/2014 1015   CALCIUM 11.1* 03/25/2014 0850   ALKPHOS 68 09/18/2014 1000   ALKPHOS 86 07/28/2014 1015   AST 13 09/18/2014 1000   AST 16 07/28/2014 1015   ALT 14 09/18/2014 1000   ALT 14 07/28/2014 1015   BILITOT 0.28 09/18/2014 1000   BILITOT 0.2* 07/28/2014  1015      STUDIES: No results found.  ASSESSMENT: The patient is a 68  y.o.[name] woman presenting with hypercalcemia, anemia and mottled bone lesions April 2015, M-spike 3.82 g, IFE showing IgA/kappa myeloma with bone marrow biopsy 03/30/2014 with 88% plasma cells, FISH  t(4,14), deletion 13 and deletion 11 (intermediate risk); initial beta-2 microglobulin 16.  (1) considered E1A11 study, but unable to tolerate anticoagulation  (2) associated issues:  (a) coagulopathy: history of post-op PE; history of bleeding with warfarin; epistaxis with  ASA 325 ms/ day; switched to 81 mg/ day with fair tolerance  (c) hypercalcemia: started zolendronate 03/26/2014, to be repeated monthly  (d) pain: compression fracture L1, rib fractures: on tylenol and tramadol  (e) immunocompromise: on acyclovir and Septra prophylaxis  (3) started dexamethasone 40 mg/ week (20 mg po on Tues and Weds April 7159); complicated by hyperglycemia with history of diabetes, typically followed by Dr. Everlene Farrier  (4) started bortezomib SQ 04/28/2014, to be given days 1,4, 8 and 11 of ech 21 day cycle  (5) starting lenalidomide first week in September  PLAN: [name] is very distraught about the cost of the lenalidomide. I spoke with Dr. Virgie Dad nurse, [name], about getting her access to a program that would take the bulk of this burden away from her. We will continue to hold the drug until we hear back news on that as she does not currently have enough medicine to last a full 21 day cycle. As for the diarrhea, [name] will hold the acyclovir for one week to determine if that is truly the cause. If the diarrhea persists, we know it is not the acyclovir and will seek other solutions.   [name] will return in the next week and a half to discuss her treatment options regarding the lenalidomide and we will reevaluate her diarrhea then. She will continue with the bortezomib, dexamethasone, and zometa as scheduled. She will continue the visits with the anti-coagulation clinic and follow their instructions. [name]  understands and agrees with this plan. She has been encouraged to call with any issues that might arise before her next visit here.   Marcelino Duster, NP   09/18/2014 1:09 PM

## 2014-09-21 ENCOUNTER — Encounter: Payer: Self-pay | Admitting: Oncology

## 2014-09-21 NOTE — Progress Notes (Addendum)
RUSS FROM [employer] CALLED TO MAKE SURE HER REVLIMID WAS STILL ON HOLD.  PER HEATHER'S NOTE IT LOOKS LIKE COST WAS THE ISSUE.   HE SAID THEY HAD GOTTEN ASSISTANCE AND SHE HAS A ZERO DOLLAR COPAY.  INFORMED MERIDETH AND SHE WILL LET THE PATIENT KNOW. [facility] PHONE NUMBER IS [phone]. WE NEED TO LET [facility] KNOW WHEN TO SHIP IT.  HEATHER CALLED AND SHE SPOKE TO THE PATIENT AND SO THEY CAN SHIP AT ANY TIME.  CALLED [facility] AND SPOKE TO RUSS WHO SAID IT WILL  EITHER GO OUT TODAY OR TOMORROW.

## 2014-09-22 ENCOUNTER — Other Ambulatory Visit (HOSPITAL_BASED_OUTPATIENT_CLINIC_OR_DEPARTMENT_OTHER): Payer: Medicare Other

## 2014-09-22 ENCOUNTER — Other Ambulatory Visit: Payer: Self-pay | Admitting: Oncology

## 2014-09-22 ENCOUNTER — Ambulatory Visit (HOSPITAL_BASED_OUTPATIENT_CLINIC_OR_DEPARTMENT_OTHER): Payer: Medicare Other | Admitting: Oncology

## 2014-09-22 ENCOUNTER — Ambulatory Visit (HOSPITAL_BASED_OUTPATIENT_CLINIC_OR_DEPARTMENT_OTHER): Payer: Medicare Other

## 2014-09-22 ENCOUNTER — Ambulatory Visit: Payer: Self-pay | Admitting: Pharmacist

## 2014-09-22 VITALS — BP 138/69 | HR 88 | Temp 98.1°F | Resp 20

## 2014-09-22 DIAGNOSIS — C9000 Multiple myeloma not having achieved remission: Secondary | ICD-10-CM

## 2014-09-22 DIAGNOSIS — R197 Diarrhea, unspecified: Secondary | ICD-10-CM

## 2014-09-22 DIAGNOSIS — Z5112 Encounter for antineoplastic immunotherapy: Secondary | ICD-10-CM

## 2014-09-22 DIAGNOSIS — I82401 Acute embolism and thrombosis of unspecified deep veins of right lower extremity: Secondary | ICD-10-CM

## 2014-09-22 LAB — COMPREHENSIVE METABOLIC PANEL (CC13)
ALBUMIN: 2.6 g/dL — AB (ref 3.5–5.0)
ALT: 20 U/L (ref 0–55)
AST: 20 U/L (ref 5–34)
Alkaline Phosphatase: 63 U/L (ref 40–150)
Anion Gap: 10 mEq/L (ref 3–11)
BUN: 12.5 mg/dL (ref 7.0–26.0)
CALCIUM: 9.4 mg/dL (ref 8.4–10.4)
CHLORIDE: 104 meq/L (ref 98–109)
CO2: 26 mEq/L (ref 22–29)
Creatinine: 1 mg/dL (ref 0.6–1.1)
Glucose: 243 mg/dl — ABNORMAL HIGH (ref 70–140)
Potassium: 4.5 mEq/L (ref 3.5–5.1)
SODIUM: 140 meq/L (ref 136–145)
TOTAL PROTEIN: 7.4 g/dL (ref 6.4–8.3)
Total Bilirubin: 0.29 mg/dL (ref 0.20–1.20)

## 2014-09-22 LAB — PROTIME-INR
INR: 2.7 (ref 2.00–3.50)
Protime: 32.4 Seconds — ABNORMAL HIGH (ref 10.6–13.4)

## 2014-09-22 LAB — CBC WITH DIFFERENTIAL/PLATELET
BASO%: 0.7 % (ref 0.0–2.0)
BASOS ABS: 0.1 10*3/uL (ref 0.0–0.1)
EOS ABS: 0.2 10*3/uL (ref 0.0–0.5)
EOS%: 1.6 % (ref 0.0–7.0)
HCT: 39.5 % (ref 34.8–46.6)
HEMOGLOBIN: 12.6 g/dL (ref 11.6–15.9)
LYMPH#: 0.5 10*3/uL — AB (ref 0.9–3.3)
LYMPH%: 4.5 % — ABNORMAL LOW (ref 14.0–49.7)
MCH: 27.2 pg (ref 25.1–34.0)
MCHC: 31.8 g/dL (ref 31.5–36.0)
MCV: 85.4 fL (ref 79.5–101.0)
MONO#: 0.4 10*3/uL (ref 0.1–0.9)
MONO%: 4.1 % (ref 0.0–14.0)
NEUT%: 89.1 % — ABNORMAL HIGH (ref 38.4–76.8)
NEUTROS ABS: 9 10*3/uL — AB (ref 1.5–6.5)
Platelets: 376 10*3/uL (ref 145–400)
RBC: 4.62 10*6/uL (ref 3.70–5.45)
RDW: 16.5 % — ABNORMAL HIGH (ref 11.2–14.5)
WBC: 10.1 10*3/uL (ref 3.9–10.3)

## 2014-09-22 LAB — POCT INR: INR: 2.7

## 2014-09-22 MED ORDER — ONDANSETRON HCL 8 MG PO TABS
8.0000 mg | ORAL_TABLET | Freq: Once | ORAL | Status: AC
  Administered 2014-09-22: 8 mg via ORAL

## 2014-09-22 MED ORDER — BORTEZOMIB CHEMO SQ INJECTION 3.5 MG (2.5MG/ML)
1.3000 mg/m2 | Freq: Once | INTRAMUSCULAR | Status: AC
  Administered 2014-09-22: 2.5 mg via SUBCUTANEOUS
  Filled 2014-09-22: qty 2.5

## 2014-09-22 MED ORDER — ONDANSETRON HCL 8 MG PO TABS
ORAL_TABLET | ORAL | Status: AC
  Filled 2014-09-22: qty 1

## 2014-09-22 MED ORDER — ACYCLOVIR 400 MG PO TABS: 400.0000 mg | ORAL_TABLET | Freq: Two times a day (BID) | ORAL | Status: DC

## 2014-09-22 NOTE — Progress Notes (Signed)
INR = 2.7   Goal 2-3 INR is within goal range. Saw patient in infusion area. Patient states she had a slight pink tinge on her tissue after blowing her nose once yesterday. She also had a small amount of blood on her toilet paper once yesterday after wiping, none today. No other complications of anticoagulation noted. She will call us if this worsens. She will stop Lovenox shots since INR is now therapeutic. She will continue Coumadin 5 mg daily. Since she is newly therapeutic and has had slight bleeding, we will recheck INR on 09/25/14 at 2:15pm for lab and 3pm for infusion. We will see her in infusion.  Theone Murdoch, PharmD

## 2014-09-22 NOTE — Patient Instructions (Signed)
[facility] Discharge Instructions for Patients Receiving Chemotherapy  Today you received the following chemotherapy agents:  Velcade  To help prevent nausea and vomiting after your treatment, we encourage you to take your nausea medication as ordered per MD.   If you develop nausea and vomiting that is not controlled by your nausea medication, call the clinic.   BELOW ARE SYMPTOMS THAT SHOULD BE REPORTED IMMEDIATELY:  *FEVER GREATER THAN 100.5 F  *CHILLS WITH OR WITHOUT FEVER  NAUSEA AND VOMITING THAT IS NOT CONTROLLED WITH YOUR NAUSEA MEDICATION  *UNUSUAL SHORTNESS OF BREATH  *UNUSUAL BRUISING OR BLEEDING  TENDERNESS IN MOUTH AND THROAT WITH OR WITHOUT PRESENCE OF ULCERS  *URINARY PROBLEMS  *BOWEL PROBLEMS  UNUSUAL RASH Items with * indicate a potential emergency and should be followed up as soon as possible.  Feel free to call the clinic you have any questions or concerns. The clinic phone number is [phone].    

## 2014-09-22 NOTE — Progress Notes (Signed)
I met with [name] today briefly just to make sure everything was in order. We reviewed her lab work, which shows a most recent M spike of 0.60 (down from 3.674 months ago) and A light chains of 5.58 (down from 5.914 months ago). She had stopped taking her lenalidomide because she understood she would have to pay $2500. Actually she is getting the medication at no cost. She just restarted the pills yesterday, October 5, and we are making sure she has a new shipment sent no cost. She is not having any significant bleeding. She is having some diarrhea, which has been persistent, and we have not done a C. difficile yet. I wrote the order and she received a "kid" so she can bring [name] a stool sample next visit. Otherwise I making no changes in her treatment plan other than rewriting her acyclovir, which had been temporarily discontinued in case that was the cause of her diarrhea.  She is very concerned because she is entering the "doughnut hole". I have asked her to call one of our social workers to see if we can get her some help in that regard.

## 2014-09-23 ENCOUNTER — Encounter: Payer: Self-pay | Admitting: Oncology

## 2014-09-23 ENCOUNTER — Other Ambulatory Visit: Payer: Self-pay | Admitting: Oncology

## 2014-09-23 ENCOUNTER — Telehealth: Payer: Self-pay | Admitting: Oncology

## 2014-09-23 DIAGNOSIS — I82401 Acute embolism and thrombosis of unspecified deep veins of right lower extremity: Secondary | ICD-10-CM

## 2014-09-23 LAB — IGG, IGA, IGM
IgA: 1670 mg/dL — ABNORMAL HIGH (ref 69–380)
IgG (Immunoglobin G), Serum: 266 mg/dL — ABNORMAL LOW (ref 690–1700)
IgM, Serum: 7 mg/dL — ABNORMAL LOW (ref 52–322)

## 2014-09-23 NOTE — Telephone Encounter (Signed)
per Melissa to sch pt CC pt aware

## 2014-09-23 NOTE — Progress Notes (Signed)
Called and the patient said she told Val about asst with Revlimid. I just sent to billing.

## 2014-09-24 ENCOUNTER — Other Ambulatory Visit: Payer: Self-pay | Admitting: Oncology

## 2014-09-24 DIAGNOSIS — R197 Diarrhea, unspecified: Secondary | ICD-10-CM

## 2014-09-24 LAB — PROTEIN ELECTROPHORESIS, SERUM
ALBUMIN ELP: 44.4 % — AB (ref 55.8–66.1)
ALPHA-1-GLOBULIN: 5.4 % — AB (ref 2.9–4.9)
Alpha-2-Globulin: 10.7 % (ref 7.1–11.8)
Beta 2: 30.1 % — ABNORMAL HIGH (ref 3.2–6.5)
Beta Globulin: 7 % (ref 4.7–7.2)
Gamma Globulin: 2.4 % — ABNORMAL LOW (ref 11.1–18.8)
M-Spike, %: 1.59 g/dL
Total Protein, Serum Electrophoresis: 7.1 g/dL (ref 6.0–8.3)

## 2014-09-24 LAB — CLOSTRIDIUM DIFFICILE BY PCR: CDIFFPCR: NEGATIVE

## 2014-09-24 LAB — KAPPA/LAMBDA LIGHT CHAINS
Kappa free light chain: 11.3 mg/dL — ABNORMAL HIGH (ref 0.33–1.94)
Kappa:Lambda Ratio: 102.73 — ABNORMAL HIGH (ref 0.26–1.65)
LAMBDA FREE LGHT CHN: 0.11 mg/dL — AB (ref 0.57–2.63)

## 2014-09-25 ENCOUNTER — Other Ambulatory Visit (HOSPITAL_BASED_OUTPATIENT_CLINIC_OR_DEPARTMENT_OTHER): Payer: Medicare Other

## 2014-09-25 ENCOUNTER — Ambulatory Visit: Payer: Medicare Other | Admitting: Pharmacist

## 2014-09-25 ENCOUNTER — Other Ambulatory Visit: Payer: Self-pay | Admitting: *Deleted

## 2014-09-25 ENCOUNTER — Ambulatory Visit (HOSPITAL_BASED_OUTPATIENT_CLINIC_OR_DEPARTMENT_OTHER): Payer: Medicare Other

## 2014-09-25 VITALS — BP 128/73 | HR 83 | Temp 98.1°F | Resp 18 | Ht 63.0 in

## 2014-09-25 DIAGNOSIS — C9000 Multiple myeloma not having achieved remission: Secondary | ICD-10-CM

## 2014-09-25 DIAGNOSIS — Z86711 Personal history of pulmonary embolism: Secondary | ICD-10-CM

## 2014-09-25 DIAGNOSIS — I82401 Acute embolism and thrombosis of unspecified deep veins of right lower extremity: Secondary | ICD-10-CM

## 2014-09-25 DIAGNOSIS — Z5112 Encounter for antineoplastic immunotherapy: Secondary | ICD-10-CM

## 2014-09-25 LAB — PROTIME-INR
INR: 2.2 (ref 2.00–3.50)
PROTIME: 26.4 s — AB (ref 10.6–13.4)

## 2014-09-25 LAB — COMPREHENSIVE METABOLIC PANEL (CC13)
ALBUMIN: 2.7 g/dL — AB (ref 3.5–5.0)
ALK PHOS: 59 U/L (ref 40–150)
ALT: 20 U/L (ref 0–55)
AST: 19 U/L (ref 5–34)
Anion Gap: 9 mEq/L (ref 3–11)
BUN: 16.2 mg/dL (ref 7.0–26.0)
CALCIUM: 9.1 mg/dL (ref 8.4–10.4)
CO2: 29 mEq/L (ref 22–29)
Chloride: 102 mEq/L (ref 98–109)
Creatinine: 1 mg/dL (ref 0.6–1.1)
GLUCOSE: 171 mg/dL — AB (ref 70–140)
POTASSIUM: 4.2 meq/L (ref 3.5–5.1)
SODIUM: 140 meq/L (ref 136–145)
TOTAL PROTEIN: 7.1 g/dL (ref 6.4–8.3)
Total Bilirubin: 0.38 mg/dL (ref 0.20–1.20)

## 2014-09-25 LAB — CBC WITH DIFFERENTIAL/PLATELET
BASO%: 0.5 % (ref 0.0–2.0)
Basophils Absolute: 0 10*3/uL (ref 0.0–0.1)
EOS%: 2.9 % (ref 0.0–7.0)
Eosinophils Absolute: 0.3 10*3/uL (ref 0.0–0.5)
HCT: 36.8 % (ref 34.8–46.6)
HEMOGLOBIN: 11.7 g/dL (ref 11.6–15.9)
LYMPH%: 9.3 % — ABNORMAL LOW (ref 14.0–49.7)
MCH: 27.1 pg (ref 25.1–34.0)
MCHC: 31.8 g/dL (ref 31.5–36.0)
MCV: 85.2 fL (ref 79.5–101.0)
MONO#: 1.3 10*3/uL — AB (ref 0.1–0.9)
MONO%: 14.3 % — ABNORMAL HIGH (ref 0.0–14.0)
NEUT%: 73 % (ref 38.4–76.8)
NEUTROS ABS: 6.6 10*3/uL — AB (ref 1.5–6.5)
Platelets: 334 10*3/uL (ref 145–400)
RBC: 4.33 10*6/uL (ref 3.70–5.45)
RDW: 16.9 % — AB (ref 11.2–14.5)
WBC: 9.1 10*3/uL (ref 3.9–10.3)
lymph#: 0.8 10*3/uL — ABNORMAL LOW (ref 0.9–3.3)

## 2014-09-25 LAB — POCT INR: INR: 2.2

## 2014-09-25 MED ORDER — ZOLEDRONIC ACID 4 MG/5ML IV CONC
3.5000 mg | Freq: Once | INTRAVENOUS | Status: AC
  Administered 2014-09-25: 3.5 mg via INTRAVENOUS
  Filled 2014-09-25: qty 4.38

## 2014-09-25 MED ORDER — ONDANSETRON HCL 8 MG PO TABS
ORAL_TABLET | ORAL | Status: AC
  Filled 2014-09-25: qty 1

## 2014-09-25 MED ORDER — ONDANSETRON HCL 8 MG PO TABS
8.0000 mg | ORAL_TABLET | Freq: Once | ORAL | Status: AC
  Administered 2014-09-25: 8 mg via ORAL

## 2014-09-25 MED ORDER — ALTEPLASE 2 MG IJ SOLR
2.0000 mg | Freq: Once | INTRAMUSCULAR | Status: DC | PRN
  Filled 2014-09-25: qty 2

## 2014-09-25 MED ORDER — BORTEZOMIB CHEMO SQ INJECTION 3.5 MG (2.5MG/ML)
1.3000 mg/m2 | Freq: Once | INTRAMUSCULAR | Status: AC
  Administered 2014-09-25: 2.5 mg via SUBCUTANEOUS
  Filled 2014-09-25: qty 2.5

## 2014-09-25 MED ORDER — SODIUM CHLORIDE 0.9 % IJ SOLN
10.0000 mL | INTRAMUSCULAR | Status: DC | PRN
  Filled 2014-09-25: qty 10

## 2014-09-25 NOTE — Patient Instructions (Signed)
Continue Coumadin 5mg  daily.  Recheck INR on 09/29/14; Lab at 10:30am, Treatment at 11:15am and Coumadin clinic at 11:30am.  We will see you in infusion area.

## 2014-09-25 NOTE — Patient Instructions (Signed)
Bortezomib injection What is this medicine? BORTEZOMIB (bor TEZ oh mib) is a chemotherapy drug. It slows the growth of cancer cells. This medicine is used to treat multiple myeloma, and certain lymphomas, such as mantle-cell lymphoma. This medicine may be used for other purposes; ask your health care provider or pharmacist if you have questions. COMMON BRAND NAME(S): Velcade What should I tell my health care provider before I take this medicine? They need to know if you have any of these conditions: -diabetes -heart disease -irregular heartbeat -liver disease -on hemodialysis -low blood counts, like low white blood cells, platelets, or hemoglobin -peripheral neuropathy -taking medicine for blood pressure -an unusual or allergic reaction to bortezomib, mannitol, boron, other medicines, foods, dyes, or preservatives -pregnant or trying to get pregnant -breast-feeding How should I use this medicine? This medicine is for injection into a vein or for injection under the skin. It is given by a health care professional in a hospital or clinic setting. Talk to your pediatrician regarding the use of this medicine in children. Special care may be needed. Overdosage: If you think you have taken too much of this medicine contact a poison control center or emergency room at once. NOTE: This medicine is only for you. Do not share this medicine with others. What if I miss a dose? It is important not to miss your dose. Call your doctor or health care professional if you are unable to keep an appointment. What may interact with this medicine? This medicine may interact with the following medications: -ketoconazole -rifampin -ritonavir -St. John's Wort This list may not describe all possible interactions. Give your health care provider a list of all the medicines, herbs, non-prescription drugs, or dietary supplements you use. Also tell them if you smoke, drink alcohol, or use illegal drugs. Some items  may interact with your medicine. What should I watch for while using this medicine? Visit your doctor for checks on your progress. This drug may make you feel generally unwell. This is not uncommon, as chemotherapy can affect healthy cells as well as cancer cells. Report any side effects. Continue your course of treatment even though you feel ill unless your doctor tells you to stop. You may get drowsy or dizzy. Do not drive, use machinery, or do anything that needs mental alertness until you know how this medicine affects you. Do not stand or sit up quickly, especially if you are an older patient. This reduces the risk of dizzy or fainting spells. In some cases, you may be given additional medicines to help with side effects. Follow all directions for their use. Call your doctor or health care professional for advice if you get a fever, chills or sore throat, or other symptoms of a cold or flu. Do not treat yourself. This drug decreases your body's ability to fight infections. Try to avoid being around people who are sick. This medicine may increase your risk to bruise or bleed. Call your doctor or health care professional if you notice any unusual bleeding. You may need blood work done while you are taking this medicine. In some patients, this medicine may cause a serious brain infection that may cause death. If you have any problems seeing, thinking, speaking, walking, or standing, tell your doctor right away. If you cannot reach your doctor, urgently seek other source of medical care. Do not become pregnant while taking this medicine. Women should inform their doctor if they wish to become pregnant or think they might be pregnant. There is   a potential for serious side effects to an unborn child. Talk to your health care professional or pharmacist for more information. Do not breast-feed an infant while taking this medicine. Check with your doctor or health care professional if you get an attack of  severe diarrhea, nausea and vomiting, or if you sweat a lot. The loss of too much body fluid can make it dangerous for you to take this medicine. What side effects may I notice from receiving this medicine? Side effects that you should report to your doctor or health care professional as soon as possible: -allergic reactions like skin rash, itching or hives, swelling of the face, lips, or tongue -breathing problems -changes in hearing -changes in vision -fast, irregular heartbeat -feeling faint or lightheaded, falls -pain, tingling, numbness in the hands or feet -right upper belly pain -seizures -swelling of the ankles, feet, hands -unusual bleeding or bruising -unusually weak or tired -vomiting -yellowing of the eyes or skin Side effects that usually do not require medical attention (report to your doctor or health care professional if they continue or are bothersome): -changes in emotions or moods -constipation -diarrhea -loss of appetite -headache -irritation at site where injected -nausea This list may not describe all possible side effects. Call your doctor for medical advice about side effects. You may report side effects to FDA at 1-800-FDA-1088. Where should I keep my medicine? This drug is given in a hospital or clinic and will not be stored at home. NOTE: This sheet is a summary. It may not cover all possible information. If you have questions about this medicine, talk to your doctor, pharmacist, or health care provider.  2015, Elsevier/Gold Standard. (2013-09-29 12:46:32) Zoledronic Acid injection (Hypercalcemia, Oncology) What is this medicine? ZOLEDRONIC ACID (ZOE le dron ik AS id) lowers the amount of calcium loss from bone. It is used to treat too much calcium in your blood from cancer. It is also used to prevent complications of cancer that has spread to the bone. This medicine may be used for other purposes; ask your health care provider or pharmacist if you have  questions. COMMON BRAND NAME(S): Zometa What should I tell my health care provider before I take this medicine? They need to know if you have any of these conditions: -aspirin-sensitive asthma -cancer, especially if you are receiving medicines used to treat cancer -dental disease or wear dentures -infection -kidney disease -receiving corticosteroids like dexamethasone or prednisone -an unusual or allergic reaction to zoledronic acid, other medicines, foods, dyes, or preservatives -pregnant or trying to get pregnant -breast-feeding How should I use this medicine? This medicine is for infusion into a vein. It is given by a health care professional in a hospital or clinic setting. Talk to your pediatrician regarding the use of this medicine in children. Special care may be needed. Overdosage: If you think you have taken too much of this medicine contact a poison control center or emergency room at once. NOTE: This medicine is only for you. Do not share this medicine with others. What if I miss a dose? It is important not to miss your dose. Call your doctor or health care professional if you are unable to keep an appointment. What may interact with this medicine? -certain antibiotics given by injection -NSAIDs, medicines for pain and inflammation, like ibuprofen or naproxen -some diuretics like bumetanide, furosemide -teriparatide -thalidomide This list may not describe all possible interactions. Give your health care provider a list of all the medicines, herbs, non-prescription drugs, or dietary   supplements you use. Also tell them if you smoke, drink alcohol, or use illegal drugs. Some items may interact with your medicine. What should I watch for while using this medicine? Visit your doctor or health care professional for regular checkups. It may be some time before you see the benefit from this medicine. Do not stop taking your medicine unless your doctor tells you to. Your doctor may  order blood tests or other tests to see how you are doing. Women should inform their doctor if they wish to become pregnant or think they might be pregnant. There is a potential for serious side effects to an unborn child. Talk to your health care professional or pharmacist for more information. You should make sure that you get enough calcium and vitamin D while you are taking this medicine. Discuss the foods you eat and the vitamins you take with your health care professional. Some people who take this medicine have severe bone, joint, and/or muscle pain. This medicine may also increase your risk for jaw problems or a broken thigh bone. Tell your doctor right away if you have severe pain in your jaw, bones, joints, or muscles. Tell your doctor if you have any pain that does not go away or that gets worse. Tell your dentist and dental surgeon that you are taking this medicine. You should not have major dental surgery while on this medicine. See your dentist to have a dental exam and fix any dental problems before starting this medicine. Take good care of your teeth while on this medicine. Make sure you see your dentist for regular follow-up appointments. What side effects may I notice from receiving this medicine? Side effects that you should report to your doctor or health care professional as soon as possible: -allergic reactions like skin rash, itching or hives, swelling of the face, lips, or tongue -anxiety, confusion, or depression -breathing problems -changes in vision -eye pain -feeling faint or lightheaded, falls -jaw pain, especially after dental work -mouth sores -muscle cramps, stiffness, or weakness -trouble passing urine or change in the amount of urine Side effects that usually do not require medical attention (report to your doctor or health care professional if they continue or are bothersome): -bone, joint, or muscle pain -constipation -diarrhea -fever -hair loss -irritation  at site where injected -loss of appetite -nausea, vomiting -stomach upset -trouble sleeping -trouble swallowing -weak or tired This list may not describe all possible side effects. Call your doctor for medical advice about side effects. You may report side effects to FDA at 1-800-FDA-1088. Where should I keep my medicine? This drug is given in a hospital or clinic and will not be stored at home. NOTE: This sheet is a summary. It may not cover all possible information. If you have questions about this medicine, talk to your doctor, pharmacist, or health care provider.  2015, Elsevier/Gold Standard. (2013-05-15 13:03:13)  

## 2014-09-25 NOTE — Addendum Note (Signed)
Addended by: Laureen Abrahams on: 09/25/2014 02:04 PM   Modules accepted: Orders

## 2014-09-25 NOTE — Progress Notes (Signed)
INR within goal today. Hg/Hct:  11.7/36.8, Pltc:  334 Pt missed her dose yesterday. She has been having a lot of diarrhea. No extra coumadin doses. No changes in diet or medications. No unusual bruising. No bleeding noted. No s/s of clotting noted. Continue Coumadin 5mg  daily.  Recheck INR on 09/29/14; Lab at 10:30am, Treatment at 11:15am and Coumadin clinic at 11:30am.  We will see you in infusion area.

## 2014-09-28 ENCOUNTER — Encounter (HOSPITAL_COMMUNITY): Payer: Self-pay | Admitting: Emergency Medicine

## 2014-09-28 ENCOUNTER — Emergency Department (HOSPITAL_COMMUNITY): Payer: Medicare Other

## 2014-09-28 ENCOUNTER — Encounter: Payer: Self-pay | Admitting: *Deleted

## 2014-09-28 ENCOUNTER — Inpatient Hospital Stay (HOSPITAL_COMMUNITY)
Admission: EM | Admit: 2014-09-28 | Discharge: 2014-10-01 | DRG: 194 | Disposition: A | Discharge status: Home / Self Care | Payer: Medicare Other | Attending: Internal Medicine | Admitting: Internal Medicine

## 2014-09-28 ENCOUNTER — Telehealth: Payer: Self-pay | Admitting: Oncology

## 2014-09-28 DIAGNOSIS — I10 Essential (primary) hypertension: Secondary | ICD-10-CM | POA: Diagnosis present

## 2014-09-28 DIAGNOSIS — Z5181 Encounter for therapeutic drug level monitoring: Secondary | ICD-10-CM

## 2014-09-28 DIAGNOSIS — B351 Tinea unguium: Secondary | ICD-10-CM

## 2014-09-28 DIAGNOSIS — J189 Pneumonia, unspecified organism: Principal | ICD-10-CM | POA: Diagnosis present

## 2014-09-28 DIAGNOSIS — M79605 Pain in left leg: Secondary | ICD-10-CM

## 2014-09-28 DIAGNOSIS — R0902 Hypoxemia: Secondary | ICD-10-CM

## 2014-09-28 DIAGNOSIS — C9000 Multiple myeloma not having achieved remission: Secondary | ICD-10-CM | POA: Diagnosis present

## 2014-09-28 DIAGNOSIS — R040 Epistaxis: Secondary | ICD-10-CM

## 2014-09-28 DIAGNOSIS — R0600 Dyspnea, unspecified: Secondary | ICD-10-CM

## 2014-09-28 DIAGNOSIS — R197 Diarrhea, unspecified: Secondary | ICD-10-CM

## 2014-09-28 DIAGNOSIS — N9089 Other specified noninflammatory disorders of vulva and perineum: Secondary | ICD-10-CM

## 2014-09-28 DIAGNOSIS — Z955 Presence of coronary angioplasty implant and graft: Secondary | ICD-10-CM

## 2014-09-28 DIAGNOSIS — D509 Iron deficiency anemia, unspecified: Secondary | ICD-10-CM

## 2014-09-28 DIAGNOSIS — Q8500 Neurofibromatosis, unspecified: Secondary | ICD-10-CM

## 2014-09-28 DIAGNOSIS — Z90710 Acquired absence of both cervix and uterus: Secondary | ICD-10-CM

## 2014-09-28 DIAGNOSIS — C9002 Multiple myeloma in relapse: Secondary | ICD-10-CM | POA: Diagnosis present

## 2014-09-28 DIAGNOSIS — Z881 Allergy status to other antibiotic agents status: Secondary | ICD-10-CM

## 2014-09-28 DIAGNOSIS — R300 Dysuria: Secondary | ICD-10-CM

## 2014-09-28 DIAGNOSIS — R0489 Hemorrhage from other sites in respiratory passages: Secondary | ICD-10-CM | POA: Diagnosis present

## 2014-09-28 DIAGNOSIS — I119 Hypertensive heart disease without heart failure: Secondary | ICD-10-CM

## 2014-09-28 DIAGNOSIS — Z7901 Long term (current) use of anticoagulants: Secondary | ICD-10-CM

## 2014-09-28 DIAGNOSIS — E669 Obesity, unspecified: Secondary | ICD-10-CM | POA: Diagnosis present

## 2014-09-28 DIAGNOSIS — R079 Chest pain, unspecified: Secondary | ICD-10-CM

## 2014-09-28 DIAGNOSIS — Z6832 Body mass index (BMI) 32.0-32.9, adult: Secondary | ICD-10-CM

## 2014-09-28 DIAGNOSIS — Z888 Allergy status to other drugs, medicaments and biological substances status: Secondary | ICD-10-CM

## 2014-09-28 DIAGNOSIS — Z8249 Family history of ischemic heart disease and other diseases of the circulatory system: Secondary | ICD-10-CM

## 2014-09-28 DIAGNOSIS — Z79899 Other long term (current) drug therapy: Secondary | ICD-10-CM

## 2014-09-28 DIAGNOSIS — I252 Old myocardial infarction: Secondary | ICD-10-CM

## 2014-09-28 DIAGNOSIS — R042 Hemoptysis: Secondary | ICD-10-CM | POA: Diagnosis present

## 2014-09-28 DIAGNOSIS — D62 Acute posthemorrhagic anemia: Secondary | ICD-10-CM

## 2014-09-28 DIAGNOSIS — I2699 Other pulmonary embolism without acute cor pulmonale: Secondary | ICD-10-CM

## 2014-09-28 DIAGNOSIS — Z87440 Personal history of urinary (tract) infections: Secondary | ICD-10-CM

## 2014-09-28 DIAGNOSIS — M1710 Unilateral primary osteoarthritis, unspecified knee: Secondary | ICD-10-CM | POA: Diagnosis present

## 2014-09-28 DIAGNOSIS — Z885 Allergy status to narcotic agent status: Secondary | ICD-10-CM

## 2014-09-28 DIAGNOSIS — I82401 Acute embolism and thrombosis of unspecified deep veins of right lower extremity: Secondary | ICD-10-CM | POA: Diagnosis present

## 2014-09-28 DIAGNOSIS — E785 Hyperlipidemia, unspecified: Secondary | ICD-10-CM | POA: Diagnosis present

## 2014-09-28 DIAGNOSIS — I2510 Atherosclerotic heart disease of native coronary artery without angina pectoris: Secondary | ICD-10-CM | POA: Diagnosis present

## 2014-09-28 DIAGNOSIS — R5381 Other malaise: Secondary | ICD-10-CM

## 2014-09-28 DIAGNOSIS — Z96652 Presence of left artificial knee joint: Secondary | ICD-10-CM | POA: Diagnosis present

## 2014-09-28 DIAGNOSIS — Z86711 Personal history of pulmonary embolism: Secondary | ICD-10-CM

## 2014-09-28 DIAGNOSIS — E119 Type 2 diabetes mellitus without complications: Secondary | ICD-10-CM | POA: Diagnosis present

## 2014-09-28 HISTORY — DX: Unspecified osteoarthritis, unspecified site: M19.90

## 2014-09-28 HISTORY — DX: Multiple myeloma not having achieved remission: C90.00

## 2014-09-28 HISTORY — DX: Personal history of other medical treatment: Z92.89

## 2014-09-28 HISTORY — DX: Type 2 diabetes mellitus without complications: E11.9

## 2014-09-28 HISTORY — DX: Acute embolism and thrombosis of unspecified deep veins of unspecified lower extremity: I82.409

## 2014-09-28 LAB — CBC WITH DIFFERENTIAL/PLATELET
Basophils Absolute: 0 10*3/uL (ref 0.0–0.1)
Basophils Relative: 0 % (ref 0–1)
EOS ABS: 0.3 10*3/uL (ref 0.0–0.7)
Eosinophils Relative: 4 % (ref 0–5)
HCT: 36.4 % (ref 36.0–46.0)
HEMOGLOBIN: 12 g/dL (ref 12.0–15.0)
Lymphocytes Relative: 9 % — ABNORMAL LOW (ref 12–46)
Lymphs Abs: 0.8 10*3/uL (ref 0.7–4.0)
MCH: 27.7 pg (ref 26.0–34.0)
MCHC: 33 g/dL (ref 30.0–36.0)
MCV: 84.1 fL (ref 78.0–100.0)
MONOS PCT: 8 % (ref 3–12)
Monocytes Absolute: 0.7 10*3/uL (ref 0.1–1.0)
NEUTROS PCT: 79 % — AB (ref 43–77)
Neutro Abs: 7 10*3/uL (ref 1.7–7.7)
Platelets: 195 10*3/uL (ref 150–400)
RBC: 4.33 MIL/uL (ref 3.87–5.11)
RDW: 16.1 % — AB (ref 11.5–15.5)
WBC: 8.8 10*3/uL (ref 4.0–10.5)

## 2014-09-28 LAB — BASIC METABOLIC PANEL
ANION GAP: 12 (ref 5–15)
BUN: 12 mg/dL (ref 6–23)
CHLORIDE: 97 meq/L (ref 96–112)
CO2: 27 mEq/L (ref 19–32)
Calcium: 8.7 mg/dL (ref 8.4–10.5)
Creatinine, Ser: 0.87 mg/dL (ref 0.50–1.10)
GFR, EST AFRICAN AMERICAN: 78 mL/min — AB (ref >90)
GFR, EST NON AFRICAN AMERICAN: 67 mL/min — AB (ref >90)
Glucose, Bld: 151 mg/dL — ABNORMAL HIGH (ref 70–99)
Potassium: 4.7 mEq/L (ref 3.7–5.3)
Sodium: 136 mEq/L — ABNORMAL LOW (ref 137–147)

## 2014-09-28 LAB — PROTIME-INR
INR: 2.69 — AB (ref 0.00–1.49)
Prothrombin Time: 28.6 seconds — ABNORMAL HIGH (ref 11.6–15.2)

## 2014-09-28 NOTE — ED Provider Notes (Addendum)
CSN: [id number]     Arrival date & time 09/28/14  1748 History   First MD Initiated Contact with Patient 09/28/14 2303     Chief Complaint  Patient presents with  . Hemoptysis     (Consider location/radiation/quality/duration/timing/severity/associated sxs/prior Treatment) HPI Comments: Pt comes in with cc of bloody cough. Pt has hx of DVT, PE, CAD, DM. She reports that she started having bloody phlegm this afternoon around 1 pm. Pt has been coughing for 3 days, with initially some yellow phlegm. No fevers, no chills. No chest pain. + DIB - with coughing.  No hx of similar sx before.  The history is provided by the patient and medical records.    Past Medical History  Diagnosis Date  . Neurofibromatosis   . Diabetes mellitus   . HTN (hypertension)   . Obesity   . Osteoarthritis, knee   . Hyperlipidemia     statin intolerant. LDL is 83  . UTI (lower urinary tract infection) 05/29/12  . 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 Pulmonary embolism [id number]   Past Surgical History  Procedure Laterality Date  . Cardiac catheterization    . Tonsillectomy and adenoidectomy    . Clavicle surgery    . Abdominal hysterectomy    . Ankle fracture surgery Right   . Total knee arthroplasty Left 09/15/2013    Procedure: TOTAL KNEE ARTHROPLASTY;  Surgeon: Vickey Huger, MD;  Location: [facility];  Service: Orthopedics;  Laterality: Left;   Family History  Problem Relation Age of Onset  . Heart disease Mother   . Arthritis Mother   . Stroke Mother   . Heart disease Son    History  Substance Use Topics  . Smoking status: Never Smoker   . Smokeless tobacco: Never Used  . Alcohol Use: No   OB History   Grav Para Term Preterm Abortions TAB SAB Ect Mult Living                 Review of Systems  Constitutional: Positive for activity change. Negative for fever and chills.  HENT: Negative for congestion, nosebleeds and rhinorrhea.    Respiratory: Positive for cough and shortness of breath.   Cardiovascular: Negative for chest pain.  Gastrointestinal: Negative for nausea, vomiting and abdominal pain.  Genitourinary: Negative for dysuria.  Musculoskeletal: Negative for neck pain.  Neurological: Negative for headaches.  All other systems reviewed and are negative.     Allergies  Codeine; Hydrocodone; Niaspan; Robaxin; Statins; Tetracycline; Zetia; and Zithromax  Home Medications   Prior to Admission medications   Medication Sig Start Date End Date Taking? Authorizing Provider  acetaminophen (TYLENOL) 325 MG tablet Take 1-2 tablets (325-650 mg total) by mouth every 4 (four) hours as needed for mild pain. 04/07/14  Yes Ivan Anchors Love, PA-C  acyclovir (ZOVIRAX) 400 MG tablet Take 1 tablet (400 mg total) by mouth 2 (two) times daily. Order was written for 5 times daily but Dr. Jana Hakim said it should be bid. 09/22/14  Yes Chauncey Cruel, MD  cetirizine (ZYRTEC) 10 MG tablet Take 10 mg by mouth daily.   Yes Historical Provider, MD  dexamethasone (DECADRON) 4 MG tablet Take 5 tablets (20 mg total) by mouth as directed. 04/10/14  Yes Chauncey Cruel, MD  glimepiride (AMARYL) 2 MG tablet Take 2 tablets by mouth every morning with breakfast.  On the days that you take prednisone or have chemotherapy take  another dose at the evening meal 06/01/14  Yes Collene Gobble, MD  lenalidomide (REVLIMID) 10 MG capsule Take 1 capsule (10 mg total) by mouth daily. 09/11/14  Yes Lowella Dell, MD  metoprolol tartrate (LOPRESSOR) 25 MG tablet Take 25 mg by mouth 2 (two) times daily.   Yes Historical Provider, MD  Multiple Vitamin (MULTIVITAMIN WITH MINERALS) TABS tablet Take 1 tablet by mouth at bedtime. Centrum Silver   Yes Historical Provider, MD  Multiple Vitamins-Minerals (OCUVITE PO) Take 1 tablet by mouth daily.    Yes Historical Provider, MD  NITROSTAT 0.4 MG SL tablet DISSOLVE 1 TABLET UNDER THE TONGUE EVERY 5 MINUTES AS NEEDED FOR  CHEST PAIN. UP TO 3 DOSES 09/16/14  Yes Cassell Clement, MD  nystatin (MYCOSTATIN/NYSTOP) 100000 UNIT/GM POWD Apply 1 g topically 3 (three) times daily as needed. 09/04/14  Yes Lowella Dell, MD  polyethylene glycol (MIRALAX / GLYCOLAX) packet Take 17 g by mouth daily.   Yes Historical Provider, MD  prochlorperazine (COMPAZINE) 10 MG tablet Take 1 tablet (10 mg total) by mouth every 6 (six) hours as needed (Nausea or vomiting). 04/28/14  Yes Lowella Dell, MD  tobramycin-dexamethasone [facility]) ophthalmic solution Place 1 drop into both eyes daily.  07/08/14  Yes Historical Provider, MD  traMADol (ULTRAM) 50 MG tablet Take 1 tablet (50 mg total) by mouth every 6 (six) hours as needed for moderate pain. 06/18/14  Yes Illa Level, NP  warfarin (COUMADIN) 5 MG tablet Take 1 tablet (5 mg total) by mouth daily. 09/15/14  Yes Payton Mccallum, NP  glucose blood (ONE TOUCH ULTRA TEST) test strip 1 each by Other route 3 (three) times daily. 04/27/14   Godfrey Pick, PA-C  Lancets MISC Check glucose 3 times daily 04/16/14   Collene Gobble, MD   BP 133/67  Pulse 92  Temp(Src) 98.7 F (37.1 C)  Resp 19  SpO2 91% Physical Exam  Nursing note and vitals reviewed. Constitutional: She is oriented to person, place, and time. She appears well-developed and well-nourished.  HENT:  Head: Normocephalic and atraumatic.  Eyes: EOM are normal. Pupils are equal, round, and reactive to light.  Neck: Neck supple.  Cardiovascular: Normal rate, regular rhythm and normal heart sounds.   No murmur heard. Pulmonary/Chest: Effort normal. No respiratory distress.  Abdominal: Soft. She exhibits no distension. There is no tenderness. There is no rebound and no guarding.  Neurological: She is alert and oriented to person, place, and time.  Skin: Skin is warm and dry.    ED Course  Procedures (including critical care time) Labs Review Labs Reviewed  CBC WITH DIFFERENTIAL - Abnormal; Notable for the following:     RDW 16.1 (*)    Neutrophils Relative % 79 (*)    Lymphocytes Relative 9 (*)    All other components within normal limits  BASIC METABOLIC PANEL - Abnormal; Notable for the following:    Sodium 136 (*)    Glucose, Bld 151 (*)    GFR calc non Af Amer 67 (*)    GFR calc Af Amer 78 (*)    All other components within normal limits  PROTIME-INR - Abnormal; Notable for the following:    Prothrombin Time 28.6 (*)    INR 2.69 (*)    All other components within normal limits    Imaging Review Dg Chest 2 View (if Patient Has Fever And/or Copd)  09/28/2014   CLINICAL DATA:  Hemoptysis. Anti coagulation. Diabetes. Neurofibromatosis. Multiple myeloma.  EXAM: CHEST  2 VIEW  COMPARISON:  04/13/2014  FINDINGS: Low lung volumes are present, causing crowding of the pulmonary vasculature. Patchy airspace opacities in the lingula with bandlike opacities in the lower lobes. Indistinct airspace opacity in the upper lobes are mildly increased from 04/13/2014.  There are 2 adjacent 50-60% compression fractures at the thoracolumbar junction which appear to be new compared to 04/13/14.  IMPRESSION: 1. Two adjacent new compression fractures at the thoracolumbar junction. 2. Indistinct airspace opacities in the upper lobes, lingula, and left lower lobe, mildly increased from prior, possibly from pulmonary hemorrhage or multi lobar pneumonia.   Electronically Signed   By: Sherryl Barters M.D.   On: 09/28/2014 19:26     EKG Interpretation None      MDM   Final diagnoses:  CAP (community acquired pneumonia)  Hemoptysis   Pt comes in with hemoptysis. She is on coumadin, with hx of PE. Pt also c/o of dib, and ambulatory pulsox dropped to 89%. CURB65 is 1, CXR shows opacity  -could be hemorrhage, could be multifocal PNA. Pt hash ad multifocal PNA in the past ,detected with CT scan, and moreover, cough started 3 days ago. No fevers, lung - focal rhonchi - left sided. Will tx as CAP - admit as she is  immunocompromised, and on anticoagulation with hemoptysis.   Varney Biles, MD 09/29/14 0044  1:04 AM Dr. Hal Hope would appreciate CT PE, to eval for the extend of pulm hemorrhage, so CT ordered.   Varney Biles, MD 09/29/14 [phone]

## 2014-09-28 NOTE — ED Notes (Signed)
Pt c/o hemoptysis x 2 days; pt sts blood tinged sputum; pt sts on coumadin

## 2014-09-28 NOTE — Telephone Encounter (Signed)
NO ENTRY 

## 2014-09-28 NOTE — Telephone Encounter (Signed)
per Carolann Littler to add CC pt aware

## 2014-09-28 NOTE — ED Notes (Signed)
PT ambulated on pulse ox. Pt had steady gait but used Tech for standby assistance. Pt denies light headedness or dizziness. Pt's HR varied between 90-105bpm. Pt's o2 saturation stayed between 90-93%; dropping one time to 89%. Pt returned to bed. Pt monitored by pulse ox, bp cuff, and 12-lead.

## 2014-09-28 NOTE — Progress Notes (Signed)
RECEIVED A FAX FROM [employer] CONCERNING A CONFIRMATION OF PRESCRIPTION SHIPMENT FOR REVLIMID ON 09/25/14.

## 2014-09-29 ENCOUNTER — Ambulatory Visit: Payer: Medicare Other

## 2014-09-29 ENCOUNTER — Other Ambulatory Visit: Payer: Medicare Other

## 2014-09-29 ENCOUNTER — Inpatient Hospital Stay (HOSPITAL_COMMUNITY): Payer: Medicare Other

## 2014-09-29 DIAGNOSIS — R0489 Hemorrhage from other sites in respiratory passages: Secondary | ICD-10-CM | POA: Diagnosis present

## 2014-09-29 DIAGNOSIS — J189 Pneumonia, unspecified organism: Principal | ICD-10-CM

## 2014-09-29 DIAGNOSIS — Z888 Allergy status to other drugs, medicaments and biological substances status: Secondary | ICD-10-CM | POA: Diagnosis not present

## 2014-09-29 DIAGNOSIS — Z90710 Acquired absence of both cervix and uterus: Secondary | ICD-10-CM | POA: Diagnosis not present

## 2014-09-29 DIAGNOSIS — I82401 Acute embolism and thrombosis of unspecified deep veins of right lower extremity: Secondary | ICD-10-CM | POA: Diagnosis present

## 2014-09-29 DIAGNOSIS — M1710 Unilateral primary osteoarthritis, unspecified knee: Secondary | ICD-10-CM | POA: Diagnosis present

## 2014-09-29 DIAGNOSIS — E119 Type 2 diabetes mellitus without complications: Secondary | ICD-10-CM | POA: Diagnosis present

## 2014-09-29 DIAGNOSIS — I252 Old myocardial infarction: Secondary | ICD-10-CM | POA: Diagnosis not present

## 2014-09-29 DIAGNOSIS — E785 Hyperlipidemia, unspecified: Secondary | ICD-10-CM | POA: Diagnosis present

## 2014-09-29 DIAGNOSIS — E669 Obesity, unspecified: Secondary | ICD-10-CM | POA: Diagnosis present

## 2014-09-29 DIAGNOSIS — Z8249 Family history of ischemic heart disease and other diseases of the circulatory system: Secondary | ICD-10-CM | POA: Diagnosis not present

## 2014-09-29 DIAGNOSIS — I10 Essential (primary) hypertension: Secondary | ICD-10-CM | POA: Diagnosis present

## 2014-09-29 DIAGNOSIS — Z7901 Long term (current) use of anticoagulants: Secondary | ICD-10-CM | POA: Diagnosis not present

## 2014-09-29 DIAGNOSIS — Z79899 Other long term (current) drug therapy: Secondary | ICD-10-CM | POA: Diagnosis not present

## 2014-09-29 DIAGNOSIS — R042 Hemoptysis: Secondary | ICD-10-CM | POA: Diagnosis present

## 2014-09-29 DIAGNOSIS — C9000 Multiple myeloma not having achieved remission: Secondary | ICD-10-CM | POA: Diagnosis present

## 2014-09-29 DIAGNOSIS — Z87440 Personal history of urinary (tract) infections: Secondary | ICD-10-CM | POA: Diagnosis not present

## 2014-09-29 DIAGNOSIS — Z885 Allergy status to narcotic agent status: Secondary | ICD-10-CM | POA: Diagnosis not present

## 2014-09-29 DIAGNOSIS — Z96652 Presence of left artificial knee joint: Secondary | ICD-10-CM | POA: Diagnosis present

## 2014-09-29 DIAGNOSIS — I2699 Other pulmonary embolism without acute cor pulmonale: Secondary | ICD-10-CM

## 2014-09-29 DIAGNOSIS — D62 Acute posthemorrhagic anemia: Secondary | ICD-10-CM

## 2014-09-29 DIAGNOSIS — Z86711 Personal history of pulmonary embolism: Secondary | ICD-10-CM | POA: Diagnosis not present

## 2014-09-29 DIAGNOSIS — Z6832 Body mass index (BMI) 32.0-32.9, adult: Secondary | ICD-10-CM | POA: Diagnosis not present

## 2014-09-29 DIAGNOSIS — I2510 Atherosclerotic heart disease of native coronary artery without angina pectoris: Secondary | ICD-10-CM | POA: Diagnosis present

## 2014-09-29 DIAGNOSIS — Z955 Presence of coronary angioplasty implant and graft: Secondary | ICD-10-CM | POA: Diagnosis not present

## 2014-09-29 DIAGNOSIS — Z881 Allergy status to other antibiotic agents status: Secondary | ICD-10-CM | POA: Diagnosis not present

## 2014-09-29 DIAGNOSIS — Q8500 Neurofibromatosis, unspecified: Secondary | ICD-10-CM | POA: Diagnosis not present

## 2014-09-29 LAB — CBC WITH DIFFERENTIAL/PLATELET
BASOS PCT: 0 % (ref 0–1)
Basophils Absolute: 0 10*3/uL (ref 0.0–0.1)
EOS ABS: 0.3 10*3/uL (ref 0.0–0.7)
Eosinophils Relative: 4 % (ref 0–5)
HCT: 34.1 % — ABNORMAL LOW (ref 36.0–46.0)
Hemoglobin: 11.1 g/dL — ABNORMAL LOW (ref 12.0–15.0)
Lymphocytes Relative: 9 % — ABNORMAL LOW (ref 12–46)
Lymphs Abs: 0.8 10*3/uL (ref 0.7–4.0)
MCH: 27.2 pg (ref 26.0–34.0)
MCHC: 32.6 g/dL (ref 30.0–36.0)
MCV: 83.6 fL (ref 78.0–100.0)
MONOS PCT: 8 % (ref 3–12)
Monocytes Absolute: 0.7 10*3/uL (ref 0.1–1.0)
Neutro Abs: 6.9 10*3/uL (ref 1.7–7.7)
Neutrophils Relative %: 79 % — ABNORMAL HIGH (ref 43–77)
PLATELETS: 138 10*3/uL — AB (ref 150–400)
RBC: 4.08 MIL/uL (ref 3.87–5.11)
RDW: 16.1 % — ABNORMAL HIGH (ref 11.5–15.5)
WBC: 8.8 10*3/uL (ref 4.0–10.5)

## 2014-09-29 LAB — TYPE AND SCREEN
ABO/RH(D): B POS
ANTIBODY SCREEN: NEGATIVE

## 2014-09-29 LAB — COMPREHENSIVE METABOLIC PANEL
ALBUMIN: 2.5 g/dL — AB (ref 3.5–5.2)
ALK PHOS: 56 U/L (ref 39–117)
ALT: 16 U/L (ref 0–35)
AST: 19 U/L (ref 0–37)
Anion gap: 13 (ref 5–15)
BUN: 12 mg/dL (ref 6–23)
CO2: 26 mEq/L (ref 19–32)
Calcium: 8 mg/dL — ABNORMAL LOW (ref 8.4–10.5)
Chloride: 99 mEq/L (ref 96–112)
Creatinine, Ser: 0.98 mg/dL (ref 0.50–1.10)
GFR calc Af Amer: 68 mL/min — ABNORMAL LOW (ref >90)
GFR calc non Af Amer: 58 mL/min — ABNORMAL LOW (ref >90)
Glucose, Bld: 156 mg/dL — ABNORMAL HIGH (ref 70–99)
POTASSIUM: 4.3 meq/L (ref 3.7–5.3)
SODIUM: 138 meq/L (ref 137–147)
Total Bilirubin: 0.8 mg/dL (ref 0.3–1.2)
Total Protein: 6.9 g/dL (ref 6.0–8.3)

## 2014-09-29 LAB — GLUCOSE, CAPILLARY
GLUCOSE-CAPILLARY: 145 mg/dL — AB (ref 70–99)
GLUCOSE-CAPILLARY: 271 mg/dL — AB (ref 70–99)
GLUCOSE-CAPILLARY: 213 mg/dL — AB (ref 70–99)
Glucose-Capillary: 124 mg/dL — ABNORMAL HIGH (ref 70–99)
Glucose-Capillary: 178 mg/dL — ABNORMAL HIGH (ref 70–99)

## 2014-09-29 MED ORDER — ACETAMINOPHEN 325 MG PO TABS: 650.0000 mg | ORAL_TABLET | Freq: Four times a day (QID) | ORAL | Status: DC | PRN

## 2014-09-29 MED ORDER — ADULT MULTIVITAMIN W/MINERALS CH
1.0000 | ORAL_TABLET | Freq: Every day | ORAL | Status: DC
  Administered 2014-09-29 – 2014-09-30 (×2): 1 via ORAL
  Filled 2014-09-29 (×3): qty 1

## 2014-09-29 MED ORDER — DEXAMETHASONE 6 MG PO TABS
20.0000 mg | ORAL_TABLET | ORAL | Status: DC
  Administered 2014-09-29 – 2014-09-30 (×2): 20 mg via ORAL
  Filled 2014-09-29 (×2): qty 1

## 2014-09-29 MED ORDER — SODIUM CHLORIDE 0.9 % IJ SOLN
3.0000 mL | Freq: Two times a day (BID) | INTRAMUSCULAR | Status: DC
  Administered 2014-09-29 – 2014-10-01 (×3): 3 mL via INTRAVENOUS

## 2014-09-29 MED ORDER — WARFARIN SODIUM 5 MG PO TABS
5.0000 mg | ORAL_TABLET | Freq: Every day | ORAL | Status: DC
  Filled 2014-09-29: qty 1

## 2014-09-29 MED ORDER — INSULIN ASPART 100 UNIT/ML ~~LOC~~ SOLN
0.0000 [IU] | Freq: Three times a day (TID) | SUBCUTANEOUS | Status: DC
  Administered 2014-09-29: 1 [IU] via SUBCUTANEOUS
  Administered 2014-09-29: 2 [IU] via SUBCUTANEOUS
  Administered 2014-09-29: 5 [IU] via SUBCUTANEOUS
  Administered 2014-09-30: 3 [IU] via SUBCUTANEOUS
  Administered 2014-09-30: 5 [IU] via SUBCUTANEOUS
  Administered 2014-10-01: 2 [IU] via SUBCUTANEOUS

## 2014-09-29 MED ORDER — WARFARIN SODIUM 2.5 MG PO TABS
2.5000 mg | ORAL_TABLET | Freq: Once | ORAL | Status: AC
  Administered 2014-09-29: 2.5 mg via ORAL
  Filled 2014-09-29: qty 1

## 2014-09-29 MED ORDER — POLYETHYLENE GLYCOL 3350 17 G PO PACK
17.0000 g | PACK | Freq: Every day | ORAL | Status: DC
  Administered 2014-09-29 – 2014-09-30 (×2): 17 g via ORAL
  Filled 2014-09-29 (×3): qty 1

## 2014-09-29 MED ORDER — LEVOFLOXACIN IN D5W 750 MG/150ML IV SOLN
750.0000 mg | INTRAVENOUS | Status: DC
  Administered 2014-09-29 – 2014-09-30 (×2): 750 mg via INTRAVENOUS
  Filled 2014-09-29 (×3): qty 150

## 2014-09-29 MED ORDER — TRAMADOL HCL 50 MG PO TABS
50.0000 mg | ORAL_TABLET | Freq: Four times a day (QID) | ORAL | Status: DC | PRN
  Administered 2014-09-29: 50 mg via ORAL
  Filled 2014-09-29: qty 1

## 2014-09-29 MED ORDER — ACETAMINOPHEN 650 MG RE SUPP: 650.0000 mg | Freq: Four times a day (QID) | RECTAL | Status: DC | PRN

## 2014-09-29 MED ORDER — ONDANSETRON HCL 4 MG/2ML IJ SOLN: 4.0000 mg | Freq: Four times a day (QID) | INTRAMUSCULAR | Status: DC | PRN

## 2014-09-29 MED ORDER — TOBRAMYCIN-DEXAMETHASONE 0.3-0.1 % OP SUSP
1.0000 [drp] | Freq: Every day | OPHTHALMIC | Status: DC
  Administered 2014-09-29 – 2014-10-01 (×3): 1 [drp] via OPHTHALMIC
  Filled 2014-09-29: qty 2.5

## 2014-09-29 MED ORDER — GLIMEPIRIDE 2 MG PO TABS
2.0000 mg | ORAL_TABLET | Freq: Every day | ORAL | Status: DC
  Administered 2014-09-29 – 2014-10-01 (×3): 2 mg via ORAL
  Filled 2014-09-29 (×4): qty 1

## 2014-09-29 MED ORDER — LEVOFLOXACIN IN D5W 500 MG/100ML IV SOLN
500.0000 mg | Freq: Once | INTRAVENOUS | Status: AC
  Administered 2014-09-29: 500 mg via INTRAVENOUS
  Filled 2014-09-29: qty 100

## 2014-09-29 MED ORDER — LENALIDOMIDE 10 MG PO CAPS: 10.0000 mg | ORAL_CAPSULE | Freq: Every day | ORAL | Status: DC

## 2014-09-29 MED ORDER — ACETAMINOPHEN 325 MG PO TABS: 325.0000 mg | ORAL_TABLET | ORAL | Status: DC | PRN

## 2014-09-29 MED ORDER — ACYCLOVIR 400 MG PO TABS
400.0000 mg | ORAL_TABLET | Freq: Two times a day (BID) | ORAL | Status: DC
  Administered 2014-09-29 – 2014-10-01 (×4): 400 mg via ORAL
  Filled 2014-09-29 (×6): qty 1

## 2014-09-29 MED ORDER — IOHEXOL 350 MG/ML SOLN
100.0000 mL | Freq: Once | INTRAVENOUS | Status: AC | PRN
  Administered 2014-09-29: 100 mL via INTRAVENOUS

## 2014-09-29 MED ORDER — ONDANSETRON HCL 4 MG PO TABS: 4.0000 mg | ORAL_TABLET | Freq: Four times a day (QID) | ORAL | Status: DC | PRN

## 2014-09-29 MED ORDER — LORATADINE 10 MG PO TABS
10.0000 mg | ORAL_TABLET | Freq: Every day | ORAL | Status: DC
  Administered 2014-09-29 – 2014-10-01 (×3): 10 mg via ORAL
  Filled 2014-09-29 (×3): qty 1

## 2014-09-29 MED ORDER — NITROGLYCERIN 0.4 MG SL SUBL: 0.4000 mg | SUBLINGUAL_TABLET | SUBLINGUAL | Status: DC | PRN

## 2014-09-29 MED ORDER — METOPROLOL TARTRATE 25 MG PO TABS
25.0000 mg | ORAL_TABLET | Freq: Two times a day (BID) | ORAL | Status: DC
  Administered 2014-09-29 – 2014-10-01 (×5): 25 mg via ORAL
  Filled 2014-09-29 (×7): qty 1

## 2014-09-29 MED ORDER — PROCHLORPERAZINE MALEATE 10 MG PO TABS
10.0000 mg | ORAL_TABLET | Freq: Four times a day (QID) | ORAL | Status: DC | PRN
  Filled 2014-09-29: qty 1

## 2014-09-29 MED ORDER — IPRATROPIUM-ALBUTEROL 0.5-2.5 (3) MG/3ML IN SOLN
3.0000 mL | Freq: Four times a day (QID) | RESPIRATORY_TRACT | Status: DC
  Administered 2014-09-29 – 2014-09-30 (×4): 3 mL via RESPIRATORY_TRACT
  Filled 2014-09-29 (×4): qty 3

## 2014-09-29 MED ORDER — WARFARIN - PHARMACIST DOSING INPATIENT: Freq: Every day | Status: DC

## 2014-09-29 MED ORDER — BENZONATATE 100 MG PO CAPS
100.0000 mg | ORAL_CAPSULE | Freq: Three times a day (TID) | ORAL | Status: DC
  Administered 2014-09-29 – 2014-10-01 (×7): 100 mg via ORAL
  Filled 2014-09-29 (×9): qty 1

## 2014-09-29 NOTE — Progress Notes (Signed)
68yo female c/o hemoptysis x2d, on Coumadin PTA w/ therapeutic INR, CT concerning for PNA vs hemorrhage, to begin IV ABX.  Will start Levaquin 750mg  IV Q24H and monitor CBC, Cx.  Wynona Neat, PharmD, BCPS 09/29/2014 5:13 AM

## 2014-09-29 NOTE — Progress Notes (Signed)
Home med Revlimid (chemotherapy medication) pharmacy do not carry.  Patient will have family bring later today.

## 2014-09-29 NOTE — Progress Notes (Signed)
Patient seen and examined, admitted by Dr. Hal Hope this morning Briefly 68 year old. female with history of multiple myeloma, recently diagnosed DVT of the lower extremity and previous history of PE, CAD status post stenting, diabetes mellitus, hypertension presents to the ER because of hemoptysis.  CT chest showed bilateral air space disease reflecting alveolar hemorrhage or multifocal pneumonia, no PE BP 115/52  Pulse 86  Temp(Src) 98.3 F (36.8 C)  Resp 18  Ht 5' 2.99" (1.6 m)  Wt 82.1 kg (181 lb)  BMI 32.07 kg/m2  SpO2 92%  Patient examined Agree with current assessment and plan Hemoptysis improving per patient Placed on scheduled nebs, levofloxacin, Tessalon Admitting physician discussed with pulmonology, Dr. recommended to keep INR and low 2 range and reconsult if worsening hemoptysis. Will follow   Laketra Bowdish M.D. [facility] 09/29/2014, 12:11 PM  Pager: [phone]

## 2014-09-29 NOTE — H&P (Signed)
[facility] History and Physical  [name] OBS:[id number] DOB: June 13, 1946 DOA: 09/28/2014  Referring physician: ER physician. PCP: Jenny Reichmann, MD   Chief Complaint: Coughing up blood.  HPI: [name] is a 68 y.o. female with history of multiple myeloma, recently diagnosed DVT of the lower extremity and previous history of PE, CAD status post stenting, diabetes mellitus, hypertension presents to the ER because of hemoptysis. Patient has been having hemoptysis since last afternoon. Patient states she has benign some productive cough over the last 2 days and since yesterday she started noticing blood in her sputum. In the ER patient had CT in the lower chest which shows bilateral airspace disease concerning for alveolar hemorrhage versus pneumonia. Patient denies any chest pain or any fever chills. In the ER patient was found to be desaturating on exertion otherwise patient is not in distress. Patient's INR is therapeutic range. Patient did not notice any blood in the stools. Patient is admitted for further management.   Review of Systems: As presented in the history of presenting illness, rest negative.  Past Medical History  Diagnosis Date  . Neurofibromatosis   . Diabetes mellitus   . HTN (hypertension)   . Obesity   . Osteoarthritis, knee   . Hyperlipidemia     statin intolerant. LDL is 83  . UTI (lower urinary tract infection) 05/29/12  . 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 Pulmonary embolism [id number]   Past Surgical History  Procedure Laterality Date  . Cardiac catheterization    . Tonsillectomy and adenoidectomy    . Clavicle surgery    . Abdominal hysterectomy    . Ankle fracture surgery Right   . Total knee arthroplasty Left 09/15/2013    Procedure: TOTAL KNEE ARTHROPLASTY;  Surgeon: Vickey Huger, MD;  Location: [place];  Service: Orthopedics;  Laterality: Left;   Social History:  reports  that she has never smoked. She has never used smokeless tobacco. She reports that she does not drink alcohol or use illicit drugs. Where does patient live home. Can patient participate in ADLs? Yes.  Allergies  Allergen Reactions  . Codeine Nausea And Vomiting    Sees things  . Hydrocodone Nausea And Vomiting    Sees things  . Niaspan [Niacin Er] Nausea Only  . Robaxin [Methocarbamol] Other (See Comments)    Made patient feel funny  . Statins Nausea And Vomiting and Other (See Comments)    Myalgias/leg pain with atorvastatin, rosuvastatin, lovastatin, simvastatin  . Tetracycline Other (See Comments)    Pt doesn't remember reaction  . Zetia [Ezetimibe] Nausea And Vomiting  . Zithromax [Azithromycin] Nausea And Vomiting    Family History:  Family History  Problem Relation Age of Onset  . Heart disease Mother   . Arthritis Mother   . Stroke Mother   . Heart disease Son       Prior to Admission medications   Medication Sig Start Date End Date Taking? Authorizing Provider  acetaminophen (TYLENOL) 325 MG tablet Take 1-2 tablets (325-650 mg total) by mouth every 4 (four) hours as needed for mild pain. 04/07/14  Yes Ivan Anchors Love, PA-C  acyclovir (ZOVIRAX) 400 MG tablet Take 1 tablet (400 mg total) by mouth 2 (two) times daily. Order was written for 5 times daily but Dr. Jana Hakim said it should be bid. 09/22/14  Yes Chauncey Cruel, MD  cetirizine (ZYRTEC) 10 MG tablet Take 10  mg by mouth daily.   Yes Historical Provider, MD  dexamethasone (DECADRON) 4 MG tablet Take 5 tablets (20 mg total) by mouth as directed. 04/10/14  Yes Chauncey Cruel, MD  glimepiride (AMARYL) 2 MG tablet Take 2 tablets by mouth every morning with breakfast.  On the days that you take prednisone or have chemotherapy take another dose at the evening meal 06/01/14  Yes Darlyne Russian, MD  lenalidomide (REVLIMID) 10 MG capsule Take 1 capsule (10 mg total) by mouth daily. 09/11/14  Yes Chauncey Cruel, MD   metoprolol tartrate (LOPRESSOR) 25 MG tablet Take 25 mg by mouth 2 (two) times daily.   Yes Historical Provider, MD  Multiple Vitamin (MULTIVITAMIN WITH MINERALS) TABS tablet Take 1 tablet by mouth at bedtime. Centrum Silver   Yes Historical Provider, MD  Multiple Vitamins-Minerals (OCUVITE PO) Take 1 tablet by mouth daily.    Yes Historical Provider, MD  NITROSTAT 0.4 MG SL tablet DISSOLVE 1 TABLET UNDER THE TONGUE EVERY 5 MINUTES AS NEEDED FOR CHEST PAIN. UP TO 3 DOSES 09/16/14  Yes Darlin Coco, MD  nystatin (MYCOSTATIN/NYSTOP) 100000 UNIT/GM POWD Apply 1 g topically 3 (three) times daily as needed. 09/04/14  Yes Chauncey Cruel, MD  polyethylene glycol (MIRALAX / GLYCOLAX) packet Take 17 g by mouth daily.   Yes Historical Provider, MD  prochlorperazine (COMPAZINE) 10 MG tablet Take 1 tablet (10 mg total) by mouth every 6 (six) hours as needed (Nausea or vomiting). 04/28/14  Yes Chauncey Cruel, MD  tobramycin-dexamethasone [facility]) ophthalmic solution Place 1 drop into both eyes daily.  07/08/14  Yes Historical Provider, MD  traMADol (ULTRAM) 50 MG tablet Take 1 tablet (50 mg total) by mouth every 6 (six) hours as needed for moderate pain. 06/18/14  Yes Minette Headland, NP  warfarin (COUMADIN) 5 MG tablet Take 1 tablet (5 mg total) by mouth daily. 09/15/14  Yes Drue Second, NP  glucose blood (ONE TOUCH ULTRA TEST) test strip 1 each by Other route 3 (three) times daily. 04/27/14   Theda Sers, PA-C  Lancets MISC Check glucose 3 times daily 04/16/14   Darlyne Russian, MD    Physical Exam: Filed Vitals:   09/29/14 0030 09/29/14 0100 09/29/14 0115 09/29/14 0212  BP: 133/67 124/64 117/70 128/46  Pulse: 92 90 91 92  Temp:    98.2 F (36.8 C)  Resp: _0 SpO2: 91% 90% 91% 91%     General:  Well-developed well-nourished.  Eyes: Anicteric no pallor.  ENT:  No discharge from the ears eyes nose mouth.  Neck: No mass felt.  Cardiovascular: S1-S2 heard.  Respiratory: No  rhonchi or crepitations.  Abdomen: Soft nontender bowel sounds present. No guarding or rigidity.  Skin: No rash.  Musculoskeletal: No edema.  Psychiatric: Appears normal.  Neurologic: Alert and oriented to time place and person. Moves all extremities.  Labs on Admission:  Basic Metabolic Panel:  Recent Labs Lab 09/22/14 1015 09/25/14 1352 09/28/14 1853  NA 140 140 136*  K 4.5 4.2 4.7  CL  --   --  97  CO2 _1 GLUCOSE 243* 171* 151*  BUN 12.5 16.2 12  CREATININE 1.0 1.0 0.87  CALCIUM 9.4 9.1 8.7   Liver Function Tests:  Recent Labs Lab 09/22/14 1015 09/25/14 1352  AST 20 19  ALT 20 20  ALKPHOS 63 59  BILITOT 0.29 0.38  PROT 7.4 7.1  ALBUMIN 2.6* 2.7*   No results  found for this basename: LIPASE, AMYLASE,  in the last 168 hours No results found for this basename: AMMONIA,  in the last 168 hours CBC:  Recent Labs Lab 09/22/14 1014 09/25/14 1351 09/28/14 1853  WBC 10.1 9.1 8.8  NEUTROABS 9.0* 6.6* 7.0  HGB 12.6 11.7 12.0  HCT 39.5 36.8 36.4  MCV 85.4 85.2 84.1  PLT 376 334 195   Cardiac Enzymes: No results found for this basename: CKTOTAL, CKMB, CKMBINDEX, TROPONINI,  in the last 168 hours  BNP (last 3 results) No results found for this basename: PROBNP,  in the last 8760 hours CBG:  Recent Labs Lab 09/29/14 0208  GLUCAP 145*    Radiological Exams on Admission: Dg Chest 2 View (if Patient Has Fever And/or Copd)  09/28/2014   CLINICAL DATA:  Hemoptysis. Anti coagulation. Diabetes. Neurofibromatosis. Multiple myeloma.  EXAM: CHEST  2 VIEW  COMPARISON:  04/13/2014  FINDINGS: Low lung volumes are present, causing crowding of the pulmonary vasculature. Patchy airspace opacities in the lingula with bandlike opacities in the lower lobes. Indistinct airspace opacity in the upper lobes are mildly increased from 04/13/2014.  There are 2 adjacent 50-60% compression fractures at the thoracolumbar junction which appear to be new compared to 04/13/14.   IMPRESSION: 1. Two adjacent new compression fractures at the thoracolumbar junction. 2. Indistinct airspace opacities in the upper lobes, lingula, and left lower lobe, mildly increased from prior, possibly from pulmonary hemorrhage or multi lobar pneumonia.   Electronically Signed   By: Sherryl Barters M.D.   On: 09/28/2014 19:26   Ct Angio Chest Pe W/cm &/or Wo Cm  09/29/2014   CLINICAL DATA:  Hemoptysis for 2 days. On Coumadin. Evaluate for pulmonary hemorrhage. Initial encounter.  EXAM: CT ANGIOGRAPHY CHEST WITH CONTRAST  TECHNIQUE: Multidetector CT imaging of the chest was performed using the standard protocol during bolus administration of intravenous contrast. Multiplanar CT image reconstructions and MIPs were obtained to evaluate the vascular anatomy.  CONTRAST:  184mL OMNIPAQUE IOHEXOL 350 MG/ML SOLN  COMPARISON:  03/24/2014  FINDINGS: THORACIC INLET/BODY WALL:  No acute abnormality.  MEDIASTINUM:  No cardiomegaly or pericardial effusion. RCA atherosclerosis or stenting. There is intermittent motion, especially at the bases, which decreases sensitivity for detecting pulmonary embolism at the affected levels. Overall, the examination is diagnostic. No evidence of pulmonary embolism. Negative aorta. Elevation of the right diaphragm suggesting eventration.  LUNG WINDOWS:  Fairly symmetric, central predominant and upper lobe predominant airspace disease. Cyst slightly more extensive and dense on the left. No interlobular septal thickening, effusion, or pneumothorax.  UPPER ABDOMEN:  No acute findings.  OSSEOUS:  No acute fracture.  No suspicious lytic or blastic lesions.  Review of the MIP images confirms the above findings.  IMPRESSION: 1. Bilateral airspace disease which could reflect alveolar hemorrhage or multi focal pneumonia. 2. No evidence of pulmonary embolism.   Electronically Signed   By: Jorje Guild M.D.   On: 09/29/2014 02:09     Assessment/Plan Principal Problem:   Hemoptysis Active  Problems:   Multiple myeloma   DVT (deep venous thrombosis), right   Diabetes mellitus type 2, controlled   1. Hemoptysis - differentials include pulmonary alveolar hemorrhage versus pneumonia. At this time I have discussed with on-call primary critical care Dr. Elsworth Soho. Dr. Elsworth Soho has advised to keep INR at the low 2 range. If patient has continuous hemoptysis or desaturate some become short of breath to reconsult him in a.m. Patient has been placed on empiric antibiotics for possible pneumonia  for now. Closely follow CBC. 2. History of PE and recent DVT on Coumadin - see #1. 3. Diabetes mellitus type 2 - continue home medications. 4. Hypertension - continue home medications. 5. Multiple myeloma on chemotherapy - per oncology.    Code Status: Full code.  Family Communication: None.  Disposition Plan: Admit to inpatient.    [name] N. [facility] Pager [phone].  If 7PM-7AM, please contact night-coverage [email or web address] Password [email or web address] 09/29/2014, 2:55 AM

## 2014-09-29 NOTE — ED Notes (Signed)
Informed receiving RN that patient has CT angio and then will leave testing to go to new room

## 2014-09-29 NOTE — Progress Notes (Addendum)
ANTICOAGULATION CONSULT NOTE - Initial Consult  Pharmacy Consult for coumadin  Indication: hx of PE  Allergies  Allergen Reactions  . Codeine Nausea And Vomiting    Sees things  . Hydrocodone Nausea And Vomiting    Sees things  . Niaspan [Niacin Er] Nausea Only  . Robaxin [Methocarbamol] Other (See Comments)    Made patient feel funny  . Statins Nausea And Vomiting and Other (See Comments)    Myalgias/leg pain with atorvastatin, rosuvastatin, lovastatin, simvastatin  . Tetracycline Other (See Comments)    Pt doesn't remember reaction  . Zetia [Ezetimibe] Nausea And Vomiting  . Zithromax [Azithromycin] Nausea And Vomiting    Patient Measurements:   Heparin Dosing Weight:   Vital Signs: Temp: 98.2 F (36.8 C) (10/13 0212) BP: 128/46 mmHg (10/13 0212) Pulse Rate: 92 (10/13 0212)  Labs:  Recent Labs  09/28/14 1853  HGB 12.0  HCT 36.4  PLT 195  LABPROT 28.6*  INR 2.69*  CREATININE 0.87    The CrCl is unknown because both a height and weight (above a minimum accepted value) are required for this calculation.   Medical History: Past Medical History  Diagnosis Date  . Neurofibromatosis   . Diabetes mellitus   . HTN (hypertension)   . Obesity   . Osteoarthritis, knee   . Hyperlipidemia     statin intolerant. LDL is 83  . UTI (lower urinary tract infection) 05/29/12  . 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 Pulmonary embolism [id number]    Medications:  Prescriptions prior to admission  Medication Sig Dispense Refill  . acetaminophen (TYLENOL) 325 MG tablet Take 1-2 tablets (325-650 mg total) by mouth every 4 (four) hours as needed for mild pain.      Marland Kitchen acyclovir (ZOVIRAX) 400 MG tablet Take 1 tablet (400 mg total) by mouth 2 (two) times daily. Order was written for 5 times daily but Dr. Jana Hakim said it should be bid.  60 tablet  6  . cetirizine (ZYRTEC) 10 MG tablet Take 10 mg by mouth daily.       Marland Kitchen dexamethasone (DECADRON) 4 MG tablet Take 5 tablets (20 mg total) by mouth as directed.  40 tablet  6  . glimepiride (AMARYL) 2 MG tablet Take 2 tablets by mouth every morning with breakfast.  On the days that you take prednisone or have chemotherapy take another dose at the evening meal  90 tablet  1  . lenalidomide (REVLIMID) 10 MG capsule Take 1 capsule (10 mg total) by mouth daily.  21 capsule  4  . metoprolol tartrate (LOPRESSOR) 25 MG tablet Take 25 mg by mouth 2 (two) times daily.      . Multiple Vitamin (MULTIVITAMIN WITH MINERALS) TABS tablet Take 1 tablet by mouth at bedtime. Centrum Silver      . Multiple Vitamins-Minerals (OCUVITE PO) Take 1 tablet by mouth daily.       Marland Kitchen NITROSTAT 0.4 MG SL tablet DISSOLVE 1 TABLET UNDER THE TONGUE EVERY 5 MINUTES AS NEEDED FOR CHEST PAIN. UP TO 3 DOSES  25 tablet  0  . nystatin (MYCOSTATIN/NYSTOP) 100000 UNIT/GM POWD Apply 1 g topically 3 (three) times daily as needed.  30 g  1  . polyethylene glycol (MIRALAX / GLYCOLAX) packet Take 17 g by mouth daily.      . prochlorperazine (COMPAZINE) 10 MG tablet Take 1 tablet (10 mg total) by mouth every 6 (six) hours as  needed (Nausea or vomiting).  30 tablet  1  . tobramycin-dexamethasone (TOBRADEX) ophthalmic solution Place 1 drop into both eyes daily.       . traMADol (ULTRAM) 50 MG tablet Take 1 tablet (50 mg total) by mouth every 6 (six) hours as needed for moderate pain.  30 tablet  2  . warfarin (COUMADIN) 5 MG tablet Take 1 tablet (5 mg total) by mouth daily.  1 tablet  0  . glucose blood (ONE TOUCH ULTRA TEST) test strip 1 each by Other route 3 (three) times daily.  100 each  12  . Lancets MISC Check glucose 3 times daily  100 each  6    Assessment: pt presents with hemoptysis afer 3 day cough with yellow sputum in setting of  Hx dvt, PE CAD and DM. MD wishes to continue with coumadin with inr goal close to 2 with hemoptysis. inr currently 2.69 with hgb/hct 12/36.4 plt are 195. Will begin with home  dose coumadin 5mg  tonight at 1800. And fu daily inr and cbc's  Goal of Therapy:  INR 2-3 Monitor platelets by anticoagulation protocol: Yes   Plan:  Begin coumadin 5mg  at 1800 on 10/13  Daily INR and cbc to begin 10/14 with am labs.   Curlene Dolphin 09/29/2014,3:10 AM  Addendum: Pt. Is also on levaquin now, which may potentiate coumadin effect. Her last dose PTA was on 10/11 per Med history. Will change today's coumadin dose to 2.5mg  and f/u INR in AM.  Maryanna Shape, PharmD, BCPS  Clinical Pharmacist  Pager: [phone]

## 2014-09-29 NOTE — Progress Notes (Signed)
Patient called me to her room. She stated that she is noticing some small red dots on her left hand and her right hand.  No itching and no pain.  Leaving a note for rounding MD or PA to observe this am.

## 2014-09-29 NOTE — Progress Notes (Signed)
Utilization review completed.  

## 2014-09-30 ENCOUNTER — Ambulatory Visit: Payer: Medicare Other | Admitting: Nurse Practitioner

## 2014-09-30 ENCOUNTER — Encounter (HOSPITAL_COMMUNITY): Payer: Self-pay | Admitting: General Practice

## 2014-09-30 DIAGNOSIS — D649 Anemia, unspecified: Secondary | ICD-10-CM

## 2014-09-30 DIAGNOSIS — J188 Other pneumonia, unspecified organism: Secondary | ICD-10-CM

## 2014-09-30 DIAGNOSIS — C9000 Multiple myeloma not having achieved remission: Secondary | ICD-10-CM

## 2014-09-30 LAB — GLUCOSE, CAPILLARY
GLUCOSE-CAPILLARY: 250 mg/dL — AB (ref 70–99)
Glucose-Capillary: 108 mg/dL — ABNORMAL HIGH (ref 70–99)
Glucose-Capillary: 274 mg/dL — ABNORMAL HIGH (ref 70–99)
Glucose-Capillary: 161 mg/dL — ABNORMAL HIGH (ref 70–99)

## 2014-09-30 LAB — CBC
HCT: 36.5 % (ref 36.0–46.0)
HEMOGLOBIN: 11.6 g/dL — AB (ref 12.0–15.0)
MCH: 26.5 pg (ref 26.0–34.0)
MCHC: 31.8 g/dL (ref 30.0–36.0)
MCV: 83.5 fL (ref 78.0–100.0)
PLATELETS: 137 10*3/uL — AB (ref 150–400)
RBC: 4.37 MIL/uL (ref 3.87–5.11)
RDW: 16.2 % — ABNORMAL HIGH (ref 11.5–15.5)
WBC: 13.5 10*3/uL — ABNORMAL HIGH (ref 4.0–10.5)

## 2014-09-30 LAB — PROTIME-INR
INR: 1.72 — ABNORMAL HIGH (ref 0.00–1.49)
PROTHROMBIN TIME: 20.2 s — AB (ref 11.6–15.2)

## 2014-09-30 MED ORDER — WARFARIN SODIUM 5 MG PO TABS
5.0000 mg | ORAL_TABLET | Freq: Every day | ORAL | Status: DC
  Administered 2014-09-30: 5 mg via ORAL
  Filled 2014-09-30 (×2): qty 1

## 2014-09-30 MED ORDER — DOCUSATE SODIUM 100 MG PO CAPS
100.0000 mg | ORAL_CAPSULE | Freq: Two times a day (BID) | ORAL | Status: DC
  Administered 2014-09-30 – 2014-10-01 (×3): 100 mg via ORAL
  Filled 2014-09-30 (×3): qty 1

## 2014-09-30 MED ORDER — BISACODYL 10 MG RE SUPP: 10.0000 mg | Freq: Every day | RECTAL | Status: DC | PRN

## 2014-09-30 MED ORDER — IPRATROPIUM-ALBUTEROL 0.5-2.5 (3) MG/3ML IN SOLN: 3.0000 mL | Freq: Four times a day (QID) | RESPIRATORY_TRACT | Status: DC | PRN

## 2014-09-30 NOTE — Progress Notes (Signed)
ANTICOAGULATION CONSULT NOTE - Follow Up Consult  Pharmacy Consult for coumadin Indication: VTE treatment  Allergies  Allergen Reactions  . Codeine Nausea And Vomiting    Sees things  . Hydrocodone Nausea And Vomiting    Sees things  . Niaspan [Niacin Er] Nausea Only  . Robaxin [Methocarbamol] Other (See Comments)    Made patient feel funny  . Statins Nausea And Vomiting and Other (See Comments)    Myalgias/leg pain with atorvastatin, rosuvastatin, lovastatin, simvastatin  . Tetracycline Other (See Comments)    Pt doesn't remember reaction  . Zetia [Ezetimibe] Nausea And Vomiting  . Zithromax [Azithromycin] Nausea And Vomiting    Patient Measurements: Height: 5' 2.99" (160 cm) Weight: 181 lb (82.1 kg) IBW/kg (Calculated) : 52.38  Vital Signs: Temp: 98.2 F (36.8 C) (10/14 0600) BP: 152/89 mmHg (10/14 0600) Pulse Rate: 86 (10/14 0600)  Labs:  Recent Labs  09/28/14 1853 09/29/14 0533 09/30/14 0545  HGB 12.0 11.1* 11.6*  HCT 36.4 34.1* 36.5  PLT 195 138* 137*  LABPROT 28.6*  --  20.2*  INR 2.69*  --  1.72*  CREATININE 0.87 0.98  --     Estimated Creatinine Clearance: 56.5 ml/min (by C-G formula based on Cr of 0.98).   Assessment: 68 y.o. female with history of multiple myeloma, recently diagnosed DVT of the lower extremity and previous history of PE on coumadin PTA. She was admitted with hemoptysis, so will keep INR close to 2 range for now. INR 2.69 > 1.72, after receiving lower dose yesterday. Hgb 11.6, plt down 137 stable, no new hemoptysis. On D#2 levaquin  PTA dose: 22m daily  Goal of Therapy:  INR 2-3 Monitor platelets by anticoagulation protocol: Yes   Plan:  - Resume coumadin 569mdaily - Daily PT/INR, and monitor s/sx bleeding.  MeMaryanna ShapePharmD, BCPS  Clinical Pharmacist  Pager: [phone] 09/30/2014,10:12 AM

## 2014-09-30 NOTE — Discharge Instructions (Signed)
Information on my medicine - Coumadin®   (Warfarin) ° °This medication education was reviewed with me or my healthcare representative as part of my discharge preparation.  °Why was Coumadin prescribed for you? °Coumadin was prescribed for you because you have a blood clot or a medical condition that can cause an increased risk of forming blood clots. Blood clots can cause serious health problems by blocking the flow of blood to the heart, lung, or brain. Coumadin can prevent harmful blood clots from forming. °As a reminder your indication for Coumadin is:   Deep Vein Thrombosis Treatment ° °What test will check on my response to Coumadin? °While on Coumadin (warfarin) you will need to have an INR test regularly to ensure that your dose is keeping you in the desired range. The INR (international normalized ratio) number is calculated from the result of the laboratory test called prothrombin time (PT). ° °If an INR APPOINTMENT HAS NOT ALREADY BEEN MADE FOR YOU please schedule an appointment to have this lab work done by your health care provider within 7 days. °Your INR goal is usually a number between:  2 to 3  ° °What  do you need to  know  About  COUMADIN? °Take Coumadin (warfarin) exactly as prescribed by your healthcare provider about the same time each day.  DO NOT stop taking without talking to the doctor who prescribed the medication.  Stopping without other blood clot prevention medication to take the place of Coumadin may increase your risk of developing a new clot or stroke.  Get refills before you run out. ° °What do you do if you miss a dose? °If you miss a dose, take it as soon as you remember on the same day then continue your regularly scheduled regimen the next day.  Do not take two doses of Coumadin at the same time. ° °Important Safety Information °A possible side effect of Coumadin (Warfarin) is an increased risk of bleeding. You should call your healthcare provider right away if you experience any  of the following: °  Bleeding from an injury or your nose that does not stop. °  Unusual colored urine (red or dark brown) or unusual colored stools (red or black). °  Unusual bruising for unknown reasons. °  A serious fall or if you hit your head (even if there is no bleeding). ° °Some foods or medicines interact with Coumadin® (warfarin) and might alter your response to warfarin. To help avoid this: °  Eat a balanced diet, maintaining a consistent amount of Vitamin K. °  Notify your provider about major diet changes you plan to make. °  Avoid alcohol or limit your intake to 1 drink for women and 2 drinks for men per day. °(1 drink is 5 oz. wine, 12 oz. beer, or 1.5 oz. liquor.) ° °Make sure that ANY health care provider who prescribes medication for you knows that you are taking Coumadin (warfarin).  Also make sure the healthcare provider who is monitoring your Coumadin knows when you have started a new medication including herbals and non-prescription products. ° °Coumadin® (Warfarin)  Major Drug Interactions  °Increased Warfarin Effect Decreased Warfarin Effect  °Alcohol (large quantities) °Antibiotics (esp. Septra/Bactrim, Flagyl, Cipro) °Amiodarone (Cordarone) °Aspirin (ASA) °Cimetidine (Tagamet) °Megestrol (Megace) °NSAIDs (ibuprofen, naproxen, etc.) °Piroxicam (Feldene) °Propafenone (Rythmol SR) °Propranolol (Inderal) °Isoniazid (INH) °Posaconazole (Noxafil) Barbiturates (Phenobarbital) °Carbamazepine (Tegretol) °Chlordiazepoxide (Librium) °Cholestyramine (Questran) °Griseofulvin °Oral Contraceptives °Rifampin °Sucralfate (Carafate) °Vitamin K  ° °Coumadin® (Warfarin) Major Herbal Interactions  °Increased   Warfarin Effect Decreased Warfarin Effect  °Garlic °Ginseng °Ginkgo biloba Coenzyme Q10 °Green tea °St. John’s wort   ° °Coumadin® (Warfarin) FOOD Interactions  °Eat a consistent number of servings per week of foods HIGH in Vitamin K °(1 serving = ½ cup)  °Collards (cooked, or boiled & drained) °Kale  (cooked, or boiled & drained) °Mustard greens (cooked, or boiled & drained) °Parsley *serving size only = ¼ cup °Spinach (cooked, or boiled & drained) °Swiss chard (cooked, or boiled & drained) °Turnip greens (cooked, or boiled & drained)  °Eat a consistent number of servings per week of foods MEDIUM-HIGH in Vitamin K °(1 serving = 1 cup)  °Asparagus (cooked, or boiled & drained) °Broccoli (cooked, boiled & drained, or raw & chopped) °Brussel sprouts (cooked, or boiled & drained) *serving size only = ½ cup °Lettuce, raw (green leaf, endive, romaine) °Spinach, raw °Turnip greens, raw & chopped  ° °These websites have more information on Coumadin (warfarin):  www.coumadin.com; °www.ahrq.gov/consumer/coumadin.htm; ° ° ° ° °

## 2014-09-30 NOTE — Progress Notes (Signed)
[name]   DOB:[birth year]   VP#:[id number]   NIO#:[id number]  Subjective: [name] tells me she is feeling better, breathing easily with good sats, and no longer coughing; no hemoptysis, no pleurisy. She is not getting OOB much --"they don't let me." She was asked to bring her lenalidomide but was reluctant to release it to the hospital pharmacy (cocnerned re cost issues); currently no diarrhea. No family in room   Objective: middle aged White woman examined in bed Filed Vitals:   09/30/14 0600  BP: 152/89  Pulse: 86  Temp: 98.2 F (36.8 C)  Resp: 16    Body mass index is 32.07 kg/(m^2).  Intake/Output Summary (Last 24 hours) at 09/30/14 0810 Last data filed at 09/29/14 2200  Gross per 24 hour  Intake    840 ml  Output    600 ml  Net    240 ml     Sclerae unicteric  No peripheral adenopathy  Lungs no rales or rhonchi--auscultated anterolaterally  Heart regular rate and rhythm  Abdomen soft, obese, NT  Neuro nonfocal, well oriented, postiive affect  Breast exam: deferred  CBG (last 3)   Recent Labs  09/29/14 1637 09/29/14 2138 09/30/14 0626  GLUCAP 271* 213* 108*     Labs:  Lab Results  Component Value Date   WBC 13.5* 09/30/2014   HGB 11.6* 09/30/2014   HCT 36.5 09/30/2014   MCV 83.5 09/30/2014   PLT 137* 09/30/2014   NEUTROABS 6.9 09/29/2014    '@LASTCHEMISTRY'$ @  Urine Studies No results found for this basename: UACOL, UAPR, USPG, UPH, UTP, UGL, UKET, UBIL, UHGB, UNIT, UROB, ULEU, UEPI, UWBC, URBC, UBAC, CAST, CRYS, UCOM, BILUA,  in the last 72 hours  Basic Metabolic Panel:  Recent Labs Lab 09/25/14 1352 09/28/14 1853 09/29/14 0533  NA 140 136* 138  K 4.2 4.7 4.3  CL  --  97 99  CO2 $Re'29 27 26  'kbs$ GLUCOSE 171* 151* 156*  BUN 16.$Remov'2 12 12  'iYHdgT$ CREATININE 1.0 0.87 0.98  CALCIUM 9.1 8.7 8.0*   GFR Estimated Creatinine Clearance: 56.5 ml/min (by C-G formula based on Cr of 0.98). Liver Function Tests:  Recent Labs Lab 09/25/14 1352 09/29/14 0533   AST 19 19  ALT 20 16  ALKPHOS 59 56  BILITOT 0.38 0.8  PROT 7.1 6.9  ALBUMIN 2.7* 2.5*   No results found for this basename: LIPASE, AMYLASE,  in the last 168 hours No results found for this basename: AMMONIA,  in the last 168 hours Coagulation profile  Recent Labs Lab 09/25/14 09/25/14 1409 09/28/14 1853 09/30/14 0545  INR 2.2 2.20 2.69* 1.72*  PROTIME  --  26.4*  --   --     CBC:  Recent Labs Lab 09/25/14 1351 09/28/14 1853 09/29/14 0533 09/30/14 0545  WBC 9.1 8.8 8.8 13.5*  NEUTROABS 6.6* 7.0 6.9  --   HGB 11.7 12.0 11.1* 11.6*  HCT 36.8 36.4 34.1* 36.5  MCV 85.2 84.1 83.6 83.5  PLT 334 195 138* 137*   Cardiac Enzymes: No results found for this basename: CKTOTAL, CKMB, CKMBINDEX, TROPONINI,  in the last 168 hours BNP: No components found with this basename: POCBNP,  CBG:  Recent Labs Lab 09/29/14 0626 09/29/14 1117 09/29/14 1637 09/29/14 2138 09/30/14 0626  GLUCAP 124* 178* 271* 213* 108*   D-Dimer No results found for this basename: DDIMER,  in the last 72 hours Hgb A1c No results found for this basename: HGBA1C,  in the last 72 hours Lipid Profile No  results found for this basename: CHOL, HDL, LDLCALC, TRIG, CHOLHDL, LDLDIRECT,  in the last 72 hours Thyroid function studies No results found for this basename: TSH, T4TOTAL, FREET3, T3FREE, THYROIDAB,  in the last 72 hours Anemia work up No results found for this basename: VITAMINB12, FOLATE, FERRITIN, TIBC, IRON, RETICCTPCT,  in the last 72 hours Microbiology Recent Results (from the past 240 hour(s))  CLOSTRIDIUM DIFFICILE BY PCR     Status: None   Collection Time    09/24/14 12:57 PM      Result Value Ref Range Status   C difficile by pcr Negative  Negative Final   Comment:  This assay detects the presence of Clostridium difficile DNA codingfor toxin B (tcdB) by real-time polymerase chain reaction (PCR)amplification.This test was developed and its performance characteristics have  beendetermined by [employer].      Performance characteristics referto the analytical performance of the test. This test has not beencleared or approved by the [name] Food and Drug Administration. The FDAhas determined that such clearance or approval is not necessary. Thislaboratory is      certified under the [facility] as qualified to perform high complexity clinicallaboratory testing.      Studies:  Dg Chest 2 View (if Patient Has Fever And/or Copd)  09/28/2014   CLINICAL DATA:  Hemoptysis. Anti coagulation. Diabetes. Neurofibromatosis. Multiple myeloma.  EXAM: CHEST  2 VIEW  COMPARISON:  04/13/2014  FINDINGS: Low lung volumes are present, causing crowding of the pulmonary vasculature. Patchy airspace opacities in the lingula with bandlike opacities in the lower lobes. Indistinct airspace opacity in the upper lobes are mildly increased from 04/13/2014.  There are 2 adjacent 50-60% compression fractures at the thoracolumbar junction which appear to be new compared to 04/13/14.  IMPRESSION: 1. Two adjacent new compression fractures at the thoracolumbar junction. 2. Indistinct airspace opacities in the upper lobes, lingula, and left lower lobe, mildly increased from prior, possibly from pulmonary hemorrhage or multi lobar pneumonia.   Electronically Signed   By: Sherryl Barters M.D.   On: 09/28/2014 19:26   Ct Angio Chest Pe W/cm &/or Wo Cm  09/29/2014   CLINICAL DATA:  Hemoptysis for 2 days. On Coumadin. Evaluate for pulmonary hemorrhage. Initial encounter.  EXAM: CT ANGIOGRAPHY CHEST WITH CONTRAST  TECHNIQUE: Multidetector CT imaging of the chest was performed using the standard protocol during bolus administration of intravenous contrast. Multiplanar CT image reconstructions and MIPs were obtained to evaluate the vascular anatomy.  CONTRAST:  141mL OMNIPAQUE IOHEXOL 350 MG/ML SOLN  COMPARISON:  03/24/2014  FINDINGS: THORACIC INLET/BODY WALL:  No acute  abnormality.  MEDIASTINUM:  No cardiomegaly or pericardial effusion. RCA atherosclerosis or stenting. There is intermittent motion, especially at the bases, which decreases sensitivity for detecting pulmonary embolism at the affected levels. Overall, the examination is diagnostic. No evidence of pulmonary embolism. Negative aorta. Elevation of the right diaphragm suggesting eventration.  LUNG WINDOWS:  Fairly symmetric, central predominant and upper lobe predominant airspace disease. Cyst slightly more extensive and dense on the left. No interlobular septal thickening, effusion, or pneumothorax.  UPPER ABDOMEN:  No acute findings.  OSSEOUS:  No acute fracture.  No suspicious lytic or blastic lesions.  Review of the MIP images confirms the above findings.  IMPRESSION: 1. Bilateral airspace disease which could reflect alveolar hemorrhage or multi focal pneumonia. 2. No evidence of pulmonary embolism.   Electronically Signed   By: Jorje Guild M.D.   On: 09/29/2014 02:09  Assessment: 68 [place] with hemoptysis, bilateral lower lobe changes on chest CT  MYELOMA HISTORY:  (0)presenting with hypercalcemia, anemia and mottled bone lesions April 2015, M-spike 3.82 g, IFE showing IgA/kappa myeloma with bone marrow biopsy 03/30/2014 with 88% plasma cells, FISH t(4,14), deletion 13 and deletion 11 (intermediate risk); initial beta-2 microglobulin 16.  (1) considered E1A11 study, but unable to tolerate anticoagulation  (2) associated issues:  (a) coagulopathy: history of post-op PE; history of bleeding with warfarin; epistaxis with ASA 325 ms/ day; switched to 81 mg/ day with fair tolerance  (c) hypercalcemia: started zolendronate 03/26/2014, to be repeated monthly  (d) pain: compression fracture L1, rib fractures: on tylenol and tramadol  (e) immunocompromise: on acyclovir and Septra prophylaxis  (3) started dexamethasone 40 mg/ week (20 mg po on Tues and Weds) April 0355; complicated  by hyperglycemia with history of diabetes, typically followed by Dr. Everlene Farrier  (4) started bortezomib SQ 04/28/2014, given days 1,4, 8 and 11 of ech 21 day cycle  (5) started lenalidomide  September 2015, 10 mg dail;y 21 days on, 7 days off, with variable compliance  OTHER ISSUES: (a) R-sided PE/ Left LE DVT documented 09/17/2013 in post-op setting (knee arthroscopy)--coumadinized until April 2015 (b) Right LE DVT documented 09/14/2014-- on coumadin,  (c) diarrhea-- c. Difficile negative 09/24/2014-- on PRN imodium (d) history recurrent epistaxis on antiplatelet agents  Plan:  I am not sure whether Ceniya developed an atypical pneumonia (she is immunocompromised because of the myeloma and its treatment) or pulmonary bleeding (she had significant epistaxis on low-dose aspirin remotely). She seem simproved on current therapy and I agree with plan to keep INR closer to 2.0 than 3.0 and to treat empirically for PNA.  Anticipate d/c next 24 hrs. She already has appointment with [name] 10/02/2014. She will resume lenalidomide when she returns home, w e will hold bortezomib until 10/12/2014.  Discussed with patient. Greatly appreciate your support to and care of Ms Rynders!   Chauncey Cruel, MD 09/30/2014  8:10 AM

## 2014-09-30 NOTE — Progress Notes (Signed)
[facility] PROGRESS NOTE  [name] PRF:[id number] DOB: December 20, 1945 DOA: 09/28/2014 PCP: Jenny Reichmann, MD  Assessment/Plan: 68 y.o. female with history of multiple myeloma, recently diagnosed DVT of the lower extremity and previous history of PE, CAD status post stenting, diabetes mellitus, hypertension presents to the ER because of hemoptysis. Patient has been having hemoptysis since last afternoon. Patient states she has benign some productive cough over the last 2 days and since yesterday she started noticing blood in her sputum. In the ER patient had CT in the lower chest which shows bilateral airspace disease concerning for alveolar hemorrhage versus pneumonia. Patient denies any chest pain or any fever chills. In the ER patient was found to be desaturating on exertion otherwise patient is not in distress. Patient's INR is therapeutic range. Patient did not notice any blood in the stools. Patient is admitted for further management.   1. Hemoptysis  Probable due to pulmonary alveolar hemorrhage versus pneumonia.  -CT: Bilateral airspace disease which could reflect alveolar hemorrhage or multi focal pneumonia; no PE -per Discussions with on-call primary critical care Dr. Elsworth Soho. Dr. Elsworth Soho has advised to keep INR at the low 2 range.  -Patient has been placed on empiric antibiotics for possible pneumonia; afebrile;  -Hemoptysis resolved; no new episodes; Hg is stable;  2. History of PE and recent DVT on Coumadin - see #1. 3. Diabetes mellitus type 2 - continue home medications. 4. Hypertension - continue home medications. 5. Multiple myeloma on chemotherapy - per oncology.  Code Status: full Family Communication:  D/w patient, hem/onc (indicate person spoken with, relationship, and if by phone, the number) Disposition Plan: home 24-48 hrs    Consultants:  Hem/onc   Procedures:  none  Antibiotics:  levoflox (indicate start date, and stop date if  known)  HPI/Subjective: alert  Objective: Filed Vitals:   09/30/14 0600  BP: 152/89  Pulse: 86  Temp: 98.2 F (36.8 C)  Resp: 16    Intake/Output Summary (Last 24 hours) at 09/30/14 0930 Last data filed at 09/29/14 2200  Gross per 24 hour  Intake    600 ml  Output    300 ml  Net    300 ml   Filed Weights   09/29/14 0500  Weight: 82.1 kg (181 lb)    Exam:   General:  alert  Cardiovascular: s1,s2 rrr  Respiratory: CTA BL  Abdomen: soft, nt,nd   Musculoskeletal: no LE edema    Data Reviewed: Basic Metabolic Panel:  Recent Labs Lab 09/25/14 1352 09/28/14 1853 09/29/14 0533  NA 140 136* 138  K 4.2 4.7 4.3  CL  --  97 99  CO2 _0 GLUCOSE 171* 151* 156*  BUN 16._1 CREATININE 1.0 0.87 0.98  CALCIUM 9.1 8.7 8.0*   Liver Function Tests:  Recent Labs Lab 09/25/14 1352 09/29/14 0533  AST 19 19  ALT 20 16  ALKPHOS 59 56  BILITOT 0.38 0.8  PROT 7.1 6.9  ALBUMIN 2.7* 2.5*   No results found for this basename: LIPASE, AMYLASE,  in the last 168 hours No results found for this basename: AMMONIA,  in the last 168 hours CBC:  Recent Labs Lab 09/25/14 1351 09/28/14 1853 09/29/14 0533 09/30/14 0545  WBC 9.1 8.8 8.8 13.5*  NEUTROABS 6.6* 7.0 6.9  --   HGB 11.7 12.0 11.1* 11.6*  HCT 36.8 36.4 34.1* 36.5  MCV 85.2 84.1 83.6 83.5  PLT 334 195 138* 137*   Cardiac Enzymes:  No results found for this basename: CKTOTAL, CKMB, CKMBINDEX, TROPONINI,  in the last 168 hours BNP (last 3 results) No results found for this basename: PROBNP,  in the last 8760 hours CBG:  Recent Labs Lab 09/29/14 0626 09/29/14 1117 09/29/14 1637 09/29/14 2138 09/30/14 0626  GLUCAP 124* 178* 271* 213* 108*    Recent Results (from the past 240 hour(s))  CLOSTRIDIUM DIFFICILE BY PCR     Status: None   Collection Time    09/24/14 12:57 PM      Result Value Ref Range Status   C difficile by pcr Negative  Negative Final   Comment:  This assay detects the  presence of Clostridium difficile DNA codingfor toxin B (tcdB) by real-time polymerase chain reaction (PCR)amplification.This test was developed and its performance characteristics have beendetermined by [employer].      Performance characteristics referto the analytical performance of the test. This test has not beencleared or approved by the [name] Food and Drug Administration. The FDAhas determined that such clearance or approval is not necessary. Thislaboratory is      certified under the [facility] as qualified to perform high complexity clinicallaboratory testing.     Studies: Dg Chest 2 View (if Patient Has Fever And/or Copd)  09/28/2014   CLINICAL DATA:  Hemoptysis. Anti coagulation. Diabetes. Neurofibromatosis. Multiple myeloma.  EXAM: CHEST  2 VIEW  COMPARISON:  04/13/2014  FINDINGS: Low lung volumes are present, causing crowding of the pulmonary vasculature. Patchy airspace opacities in the lingula with bandlike opacities in the lower lobes. Indistinct airspace opacity in the upper lobes are mildly increased from 04/13/2014.  There are 2 adjacent 50-60% compression fractures at the thoracolumbar junction which appear to be new compared to 04/13/14.  IMPRESSION: 1. Two adjacent new compression fractures at the thoracolumbar junction. 2. Indistinct airspace opacities in the upper lobes, lingula, and left lower lobe, mildly increased from prior, possibly from pulmonary hemorrhage or multi lobar pneumonia.   Electronically Signed   By: Sherryl Barters M.D.   On: 09/28/2014 19:26   Ct Angio Chest Pe W/cm &/or Wo Cm  09/29/2014   CLINICAL DATA:  Hemoptysis for 2 days. On Coumadin. Evaluate for pulmonary hemorrhage. Initial encounter.  EXAM: CT ANGIOGRAPHY CHEST WITH CONTRAST  TECHNIQUE: Multidetector CT imaging of the chest was performed using the standard protocol during bolus administration of intravenous contrast. Multiplanar CT image reconstructions  and MIPs were obtained to evaluate the vascular anatomy.  CONTRAST:  157m OMNIPAQUE IOHEXOL 350 MG/ML SOLN  COMPARISON:  03/24/2014  FINDINGS: THORACIC INLET/BODY WALL:  No acute abnormality.  MEDIASTINUM:  No cardiomegaly or pericardial effusion. RCA atherosclerosis or stenting. There is intermittent motion, especially at the bases, which decreases sensitivity for detecting pulmonary embolism at the affected levels. Overall, the examination is diagnostic. No evidence of pulmonary embolism. Negative aorta. Elevation of the right diaphragm suggesting eventration.  LUNG WINDOWS:  Fairly symmetric, central predominant and upper lobe predominant airspace disease. Cyst slightly more extensive and dense on the left. No interlobular septal thickening, effusion, or pneumothorax.  UPPER ABDOMEN:  No acute findings.  OSSEOUS:  No acute fracture.  No suspicious lytic or blastic lesions.  Review of the MIP images confirms the above findings.  IMPRESSION: 1. Bilateral airspace disease which could reflect alveolar hemorrhage or multi focal pneumonia. 2. No evidence of pulmonary embolism.   Electronically Signed   By: JJorje GuildM.D.   On: 09/29/2014 02:09    Scheduled Meds: .  acyclovir  400 mg Oral BID  . benzonatate  100 mg Oral TID  . dexamethasone  20 mg Oral Once per day on Tue Wed  . docusate sodium  100 mg Oral BID  . glimepiride  2 mg Oral Q breakfast  . insulin aspart  0-9 Units Subcutaneous TID WC  . ipratropium-albuterol  3 mL Nebulization QID  . lenalidomide  10 mg Oral Daily  . levofloxacin (LEVAQUIN) IV  750 mg Intravenous Q24H  . loratadine  10 mg Oral Daily  . metoprolol tartrate  25 mg Oral BID  . multivitamin with minerals  1 tablet Oral QHS  . polyethylene glycol  17 g Oral Daily  . sodium chloride  3 mL Intravenous Q12H  . tobramycin-dexamethasone  1 drop Both Eyes Daily  . Warfarin - Pharmacist Dosing Inpatient   Does not apply q1800   Continuous Infusions:   Principal Problem:    Hemoptysis Active Problems:   Multiple myeloma   DVT (deep venous thrombosis), right   Diabetes mellitus type 2, controlled    Time spent: >35 minutes     Kinnie Feil  [facility] Pager [phone]. If 7PM-7AM, please contact night-coverage at [email or web address], password [facility] 09/30/2014, 9:30 AM  LOS: 2 days

## 2014-10-01 ENCOUNTER — Other Ambulatory Visit: Payer: Self-pay | Admitting: Nurse Practitioner

## 2014-10-01 ENCOUNTER — Telehealth: Payer: Self-pay | Admitting: Nurse Practitioner

## 2014-10-01 ENCOUNTER — Ambulatory Visit: Payer: Medicare Other | Admitting: Emergency Medicine

## 2014-10-01 DIAGNOSIS — I10 Essential (primary) hypertension: Secondary | ICD-10-CM

## 2014-10-01 LAB — CBC
HEMATOCRIT: 36 % (ref 36.0–46.0)
Hemoglobin: 11.8 g/dL — ABNORMAL LOW (ref 12.0–15.0)
MCH: 27.9 pg (ref 26.0–34.0)
MCHC: 32.8 g/dL (ref 30.0–36.0)
MCV: 85.1 fL (ref 78.0–100.0)
Platelets: 85 10*3/uL — ABNORMAL LOW (ref 150–400)
RBC: 4.23 MIL/uL (ref 3.87–5.11)
RDW: 16.5 % — ABNORMAL HIGH (ref 11.5–15.5)
WBC: 11.2 10*3/uL — ABNORMAL HIGH (ref 4.0–10.5)

## 2014-10-01 LAB — PROTIME-INR
INR: 2.11 — ABNORMAL HIGH (ref 0.00–1.49)
Prothrombin Time: 23.8 s — ABNORMAL HIGH (ref 11.6–15.2)

## 2014-10-01 LAB — GLUCOSE, CAPILLARY
Glucose-Capillary: 106 mg/dL — ABNORMAL HIGH (ref 70–99)
Glucose-Capillary: 164 mg/dL — ABNORMAL HIGH (ref 70–99)

## 2014-10-01 MED ORDER — LEVOFLOXACIN 750 MG PO TABS: 750.0000 mg | ORAL_TABLET | Freq: Every day | ORAL | Status: DC

## 2014-10-01 MED ORDER — BENZONATATE 100 MG PO CAPS: 100.0000 mg | ORAL_CAPSULE | Freq: Three times a day (TID) | ORAL | Status: DC

## 2014-10-01 NOTE — Progress Notes (Signed)
ANTICOAGULATION CONSULT NOTE - Follow Up Consult  Pharmacy Consult for coumadin Indication: VTE treatment  Allergies  Allergen Reactions  . Codeine Nausea And Vomiting    Sees things  . Hydrocodone Nausea And Vomiting    Sees things  . Niaspan [Niacin Er] Nausea Only  . Robaxin [Methocarbamol] Other (See Comments)    Made patient feel funny  . Statins Nausea And Vomiting and Other (See Comments)    Myalgias/leg pain with atorvastatin, rosuvastatin, lovastatin, simvastatin  . Tetracycline Other (See Comments)    Pt doesn't remember reaction  . Zetia [Ezetimibe] Nausea And Vomiting  . Zithromax [Azithromycin] Nausea And Vomiting    Patient Measurements: Height: 5' 2.99" (160 cm) Weight: 181 lb (82.1 kg) IBW/kg (Calculated) : 52.38  Vital Signs: Temp: 98.4 F (36.9 C) (10/15 0523) BP: 124/63 mmHg (10/15 0523) Pulse Rate: 84 (10/15 0523)  Labs:  Recent Labs  09/28/14 1853 09/29/14 0533 09/30/14 0545 10/01/14 0501  HGB 12.0 11.1* 11.6* 11.8*  HCT 36.4 34.1* 36.5 36.0  PLT 195 138* 137* 85*  LABPROT 28.6*  --  20.2* 23.8*  INR 2.69*  --  1.72* 2.11*  CREATININE 0.87 0.98  --   --     Estimated Creatinine Clearance: 56.5 ml/min (by C-G formula based on Cr of 0.98).   Assessment: 68 y.o. female with history of multiple myeloma, recently diagnosed DVT of the lower extremity and previous history of PE on coumadin PTA. She was admitted with hemoptysis which has been resolved, INR 2.11 today, Hgb 11.8, plt down to 85. On D#3 levaquin  PTA dose: $RemoveB'5mg'GleTFuLu$  daily  Goal of Therapy:  INR 2-3 Monitor platelets by anticoagulation protocol: Yes   Plan:  - Continue coumadin $RemoveBeforeDEI'5mg'[id number]$  daily - Daily PT/INR, and monitor s/sx bleeding.  Maryanna Shape, PharmD, BCPS  Clinical Pharmacist  Pager: [phone]   10/01/2014,1:07 PM

## 2014-10-01 NOTE — Progress Notes (Signed)
Dr. Daleen Bo assessing patient at this time.

## 2014-10-01 NOTE — Discharge Summary (Signed)
Physician Discharge Summary  [name] TGY:[id number] DOB: October 15, 1946 DOA: 09/28/2014  PCP: Jenny Reichmann, MD  Admit date: 09/28/2014 Discharge date: 10/01/2014  Time spent: >35 minutes  Recommendations for Outpatient Follow-up:  F/u with hem/onc tomorrow, to check INR, CBC F/u with Pulmonology next week   Discharge Diagnoses:  Principal Problem:   Hemoptysis Active Problems:   Multiple myeloma   DVT (deep venous thrombosis), right   Diabetes mellitus type 2, controlled   Discharge Condition: stable  Diet recommendation: low sodium  Filed Weights   09/29/14 0500  Weight: 82.1 kg (181 lb)    History of present illness:  68 y.o. female with history of multiple myeloma, recently diagnosed DVT of the lower extremity and previous history of PE, CAD status post stenting, diabetes mellitus, hypertension presents to the ER because of hemoptysis. Patient has been having hemoptysis since last afternoon. Patient states she has benign some productive cough over the last 2 days and since yesterday she started noticing blood in her sputum. In the ER patient had CT in the lower chest which shows bilateral airspace disease concerning for alveolar hemorrhage versus pneumonia. Patient denies any chest pain or any fever chills. In the ER patient was found to be desaturating on exertion otherwise patient is not in distress. Patient's INR is therapeutic range. Patient did not notice any blood in the stools. Patient is admitted for further management.   Hospital Course:  1. Hemoptysis Probable due to pulmonary alveolar hemorrhage versus pneumonia.  -CT: Bilateral airspace disease which could reflect alveolar hemorrhage or multi focal pneumonia; no PE  -per Discussions with on-call primary critical care Dr. Elsworth Soho. Dr. Elsworth Soho has advised to keep INR at the low 2 range.  -Patient has been placed on empiric antibiotics for possible pneumonia; afebrile;  -Hemoptysis resolved; no new episodes; Hg is  stable; lung exam is unremarkable;  I have discussed with patient at length offered pulmonology consultation, evaluation as inpatient; But patient wants to go home, and f/u next week as outpatient; cont levofloxacin with INR to be checked tomorrow, patient has hem/onc appointment  2. History of PE and recent DVT on Coumadin - see #1. 3. Diabetes mellitus type 2 - continue home medications. 4. Hypertension - continue home medications. 5. Multiple myeloma on chemotherapy - per oncology. Appointment in tomorrow AM/friday, mild thrombocytopenia likely consumptive; recheck in AM with INR    Procedures:  none (i.e. Studies not automatically included, echos, thoracentesis, etc; not x-rays)  Consultations:  Hem/onc  Discharge Exam: Filed Vitals:   10/01/14 0523  BP: 124/63  Pulse: 84  Temp: 98.4 F (36.9 C)  Resp: 16    General: alert Cardiovascular: s1,s2 rrr Respiratory: CTA BL  Discharge Instructions  Discharge Instructions   Diet - low sodium heart healthy    Complete by:  As directed      Discharge instructions    Complete by:  As directed   Please follow up with oncology tomorrow Please follow up with pulmonology next week on Monday     Increase activity slowly    Complete by:  As directed             Medication List         acetaminophen 325 MG tablet  Commonly known as:  TYLENOL  Take 1-2 tablets (325-650 mg total) by mouth every 4 (four) hours as needed for mild pain.     acyclovir 400 MG tablet  Commonly known as:  ZOVIRAX  Take 1 tablet (  400 mg total) by mouth 2 (two) times daily. Order was written for 5 times daily but Dr. Jana Hakim said it should be bid.     benzonatate 100 MG capsule  Commonly known as:  TESSALON  Take 1 capsule (100 mg total) by mouth 3 (three) times daily.     cetirizine 10 MG tablet  Commonly known as:  ZYRTEC  Take 10 mg by mouth daily.     dexamethasone 4 MG tablet  Commonly known as:  DECADRON  Take 5 tablets (20 mg total)  by mouth as directed.     glimepiride 2 MG tablet  Commonly known as:  AMARYL  - Take 2 tablets by mouth every morning with breakfast.   - On the days that you take prednisone or have chemotherapy take another dose at the evening meal     glucose blood test strip  Commonly known as:  ONE TOUCH ULTRA TEST  1 each by Other route 3 (three) times daily.     Lancets Misc  Check glucose 3 times daily     lenalidomide 10 MG capsule  Commonly known as:  REVLIMID  Take 1 capsule (10 mg total) by mouth daily.     levofloxacin 750 MG tablet  Commonly known as:  LEVAQUIN  Take 1 tablet (750 mg total) by mouth daily.     metoprolol tartrate 25 MG tablet  Commonly known as:  LOPRESSOR  Take 25 mg by mouth 2 (two) times daily.     multivitamin with minerals Tabs tablet  Take 1 tablet by mouth at bedtime. Centrum Silver     NITROSTAT 0.4 MG SL tablet  Generic drug:  nitroGLYCERIN  DISSOLVE 1 TABLET UNDER THE TONGUE EVERY 5 MINUTES AS NEEDED FOR CHEST PAIN. UP TO 3 DOSES     nystatin 100000 UNIT/GM Powd  Apply 1 g topically 3 (three) times daily as needed.     OCUVITE PO  Take 1 tablet by mouth daily.     polyethylene glycol packet  Commonly known as:  MIRALAX / GLYCOLAX  Take 17 g by mouth daily.     prochlorperazine 10 MG tablet  Commonly known as:  COMPAZINE  Take 1 tablet (10 mg total) by mouth every 6 (six) hours as needed (Nausea or vomiting).     tobramycin-dexamethasone ophthalmic solution  Commonly known as:  TOBRADEX  Place 1 drop into both eyes daily.     traMADol 50 MG tablet  Commonly known as:  ULTRAM  Take 1 tablet (50 mg total) by mouth every 6 (six) hours as needed for moderate pain.     warfarin 5 MG tablet  Commonly known as:  COUMADIN  Take 1 tablet (5 mg total) by mouth daily.       Allergies  Allergen Reactions  . Codeine Nausea And Vomiting    Sees things  . Hydrocodone Nausea And Vomiting    Sees things  . Niaspan [Niacin Er] Nausea Only  .  Robaxin [Methocarbamol] Other (See Comments)    Made patient feel funny  . Statins Nausea And Vomiting and Other (See Comments)    Myalgias/leg pain with atorvastatin, rosuvastatin, lovastatin, simvastatin  . Tetracycline Other (See Comments)    Pt doesn't remember reaction  . Zetia [Ezetimibe] Nausea And Vomiting  . Zithromax [Azithromycin] Nausea And Vomiting       Follow-up Information   Follow up with RAMASWAMY,MURALI, MD. Schedule an appointment as soon as possible for a visit in 5 days.  Specialty:  Pulmonary Disease   Contact information:   [address] [place] [address] [phone]       Follow up with DAUB, STEVE A, MD In 1 week.   Specialty:  Family Medicine   Contact information:   [place] [address] [phone]        The results of significant diagnostics from this hospitalization (including imaging, microbiology, ancillary and laboratory) are listed below for reference.    Significant Diagnostic Studies: Dg Chest 2 View (if Patient Has Fever And/or Copd)  09/28/2014   CLINICAL DATA:  Hemoptysis. Anti coagulation. Diabetes. Neurofibromatosis. Multiple myeloma.  EXAM: CHEST  2 VIEW  COMPARISON:  04/13/2014  FINDINGS: Low lung volumes are present, causing crowding of the pulmonary vasculature. Patchy airspace opacities in the lingula with bandlike opacities in the lower lobes. Indistinct airspace opacity in the upper lobes are mildly increased from 04/13/2014.  There are 2 adjacent 50-60% compression fractures at the thoracolumbar junction which appear to be new compared to 04/13/14.  IMPRESSION: 1. Two adjacent new compression fractures at the thoracolumbar junction. 2. Indistinct airspace opacities in the upper lobes, lingula, and left lower lobe, mildly increased from prior, possibly from pulmonary hemorrhage or multi lobar pneumonia.   Electronically Signed   By: Sherryl Barters M.D.   On: 09/28/2014 19:26   Ct Angio Chest Pe W/cm &/or Wo  Cm  09/29/2014   CLINICAL DATA:  Hemoptysis for 2 days. On Coumadin. Evaluate for pulmonary hemorrhage. Initial encounter.  EXAM: CT ANGIOGRAPHY CHEST WITH CONTRAST  TECHNIQUE: Multidetector CT imaging of the chest was performed using the standard protocol during bolus administration of intravenous contrast. Multiplanar CT image reconstructions and MIPs were obtained to evaluate the vascular anatomy.  CONTRAST:  162mL OMNIPAQUE IOHEXOL 350 MG/ML SOLN  COMPARISON:  03/24/2014  FINDINGS: THORACIC INLET/BODY WALL:  No acute abnormality.  MEDIASTINUM:  No cardiomegaly or pericardial effusion. RCA atherosclerosis or stenting. There is intermittent motion, especially at the bases, which decreases sensitivity for detecting pulmonary embolism at the affected levels. Overall, the examination is diagnostic. No evidence of pulmonary embolism. Negative aorta. Elevation of the right diaphragm suggesting eventration.  LUNG WINDOWS:  Fairly symmetric, central predominant and upper lobe predominant airspace disease. Cyst slightly more extensive and dense on the left. No interlobular septal thickening, effusion, or pneumothorax.  UPPER ABDOMEN:  No acute findings.  OSSEOUS:  No acute fracture.  No suspicious lytic or blastic lesions.  Review of the MIP images confirms the above findings.  IMPRESSION: 1. Bilateral airspace disease which could reflect alveolar hemorrhage or multi focal pneumonia. 2. No evidence of pulmonary embolism.   Electronically Signed   By: Jorje Guild M.D.   On: 09/29/2014 02:09    Microbiology: Recent Results (from the past 240 hour(s))  CLOSTRIDIUM DIFFICILE BY PCR     Status: None   Collection Time    09/24/14 12:57 PM      Result Value Ref Range Status   C difficile by pcr Negative  Negative Final   Comment:  This assay detects the presence of Clostridium difficile DNA codingfor toxin B (tcdB) by real-time polymerase chain reaction (PCR)amplification.This test was developed and its  performance characteristics have beendetermined by [employer].      Performance characteristics referto the analytical performance of the test. This test has not beencleared or approved by the [name] Food and Drug Administration. The FDAhas determined that such clearance or approval is not necessary. Thislaboratory is  certified under the [employer] as qualified to perform high complexity clinicallaboratory testing.     Labs: Basic Metabolic Panel:  Recent Labs Lab 09/25/14 1352 09/28/14 1853 09/29/14 0533  NA 140 136* 138  K 4.2 4.7 4.3  CL  --  97 99  CO2 $Re'29 27 26  'XzA$ GLUCOSE 171* 151* 156*  BUN 16.$Remov'2 12 12  'ZHnOyn$ CREATININE 1.0 0.87 0.98  CALCIUM 9.1 8.7 8.0*   Liver Function Tests:  Recent Labs Lab 09/25/14 1352 09/29/14 0533  AST 19 19  ALT 20 16  ALKPHOS 59 56  BILITOT 0.38 0.8  PROT 7.1 6.9  ALBUMIN 2.7* 2.5*   No results found for this basename: LIPASE, AMYLASE,  in the last 168 hours No results found for this basename: AMMONIA,  in the last 168 hours CBC:  Recent Labs Lab 09/25/14 1351 09/28/14 1853 09/29/14 0533 09/30/14 0545 10/01/14 0501  WBC 9.1 8.8 8.8 13.5* 11.2*  NEUTROABS 6.6* 7.0 6.9  --   --   HGB 11.7 12.0 11.1* 11.6* 11.8*  HCT 36.8 36.4 34.1* 36.5 36.0  MCV 85.2 84.1 83.6 83.5 85.1  PLT 334 195 138* 137* 85*   Cardiac Enzymes: No results found for this basename: CKTOTAL, CKMB, CKMBINDEX, TROPONINI,  in the last 168 hours BNP: BNP (last 3 results) No results found for this basename: PROBNP,  in the last 8760 hours CBG:  Recent Labs Lab 09/30/14 0626 09/30/14 1114 09/30/14 1621 09/30/14 2143 10/01/14 0630  GLUCAP 108* 250* 274* 161* 106*       Signed:  Gabreal Worton N  [facility] 10/01/2014, 10:17 AM

## 2014-10-01 NOTE — Progress Notes (Signed)
Patient spit up small amount of thick, dark red/bloody sputum. MD paged.

## 2014-10-01 NOTE — Telephone Encounter (Signed)
Lft msg for pt confirming ML visit 10/16 followed by chemo per 10/15 POF, sent msg to move chemo after ML visit..... KJ

## 2014-10-01 NOTE — Progress Notes (Signed)
Patient ready for discharge. Is going to eat lunch and then husband will be ride home.

## 2014-10-02 ENCOUNTER — Telehealth: Payer: Self-pay | Admitting: *Deleted

## 2014-10-02 ENCOUNTER — Other Ambulatory Visit (HOSPITAL_COMMUNITY): Payer: Self-pay | Admitting: Cardiology

## 2014-10-02 ENCOUNTER — Other Ambulatory Visit: Payer: Self-pay | Admitting: *Deleted

## 2014-10-02 ENCOUNTER — Ambulatory Visit: Payer: Medicare Other | Admitting: Nurse Practitioner

## 2014-10-02 ENCOUNTER — Other Ambulatory Visit: Payer: Self-pay | Admitting: Nurse Practitioner

## 2014-10-02 ENCOUNTER — Ambulatory Visit: Payer: Medicare Other

## 2014-10-02 ENCOUNTER — Telehealth: Payer: Self-pay | Admitting: Oncology

## 2014-10-02 NOTE — Telephone Encounter (Signed)
Called pt to confirm her appt on 10/19 for labs and Coumadin clinic. Pt will not come today per Dr. Jana Hakim. Pt verbalized understanding. No further concerns. Message to be forwarded to Christeen Douglas

## 2014-10-02 NOTE — Telephone Encounter (Signed)
I have adjusted appt for today

## 2014-10-02 NOTE — Telephone Encounter (Signed)
cld pt back. pt left vm and needed appt time-pt has time & date for 10/19 & 10/22

## 2014-10-05 ENCOUNTER — Other Ambulatory Visit (HOSPITAL_BASED_OUTPATIENT_CLINIC_OR_DEPARTMENT_OTHER): Payer: Medicare Other

## 2014-10-05 ENCOUNTER — Ambulatory Visit: Payer: Medicare Other

## 2014-10-05 ENCOUNTER — Ambulatory Visit (HOSPITAL_BASED_OUTPATIENT_CLINIC_OR_DEPARTMENT_OTHER): Payer: Medicare Other | Admitting: Pharmacist

## 2014-10-05 DIAGNOSIS — I82401 Acute embolism and thrombosis of unspecified deep veins of right lower extremity: Secondary | ICD-10-CM

## 2014-10-05 DIAGNOSIS — C9000 Multiple myeloma not having achieved remission: Secondary | ICD-10-CM

## 2014-10-05 LAB — CBC WITH DIFFERENTIAL/PLATELET
BASO%: 0.1 % (ref 0.0–2.0)
BASOS ABS: 0 10*3/uL (ref 0.0–0.1)
EOS%: 5.5 % (ref 0.0–7.0)
Eosinophils Absolute: 0.6 10*3/uL — ABNORMAL HIGH (ref 0.0–0.5)
HCT: 35.3 % (ref 34.8–46.6)
HEMOGLOBIN: 11.5 g/dL — AB (ref 11.6–15.9)
LYMPH#: 1.2 10*3/uL (ref 0.9–3.3)
LYMPH%: 11.9 % — ABNORMAL LOW (ref 14.0–49.7)
MCH: 28.1 pg (ref 25.1–34.0)
MCHC: 32.6 g/dL (ref 31.5–36.0)
MCV: 86.3 fL (ref 79.5–101.0)
MONO#: 1.1 10*3/uL — ABNORMAL HIGH (ref 0.1–0.9)
MONO%: 10.9 % (ref 0.0–14.0)
NEUT%: 71.6 % (ref 38.4–76.8)
NEUTROS ABS: 7.3 10*3/uL — AB (ref 1.5–6.5)
Platelets: 137 10*3/uL — ABNORMAL LOW (ref 145–400)
RBC: 4.09 10*6/uL (ref 3.70–5.45)
RDW: 17.4 % — AB (ref 11.2–14.5)
WBC: 10.2 10*3/uL (ref 3.9–10.3)

## 2014-10-05 LAB — COMPREHENSIVE METABOLIC PANEL (CC13)
ALT: 21 U/L (ref 0–55)
AST: 17 U/L (ref 5–34)
Albumin: 2.7 g/dL — ABNORMAL LOW (ref 3.5–5.0)
Alkaline Phosphatase: 51 U/L (ref 40–150)
Anion Gap: 10 mEq/L (ref 3–11)
BUN: 19.2 mg/dL (ref 7.0–26.0)
CALCIUM: 9 mg/dL (ref 8.4–10.4)
CHLORIDE: 105 meq/L (ref 98–109)
CO2: 23 mEq/L (ref 22–29)
Creatinine: 1 mg/dL (ref 0.6–1.1)
Glucose: 241 mg/dl — ABNORMAL HIGH (ref 70–140)
Potassium: 4 mEq/L (ref 3.5–5.1)
Sodium: 139 mEq/L (ref 136–145)
Total Bilirubin: 0.92 mg/dL (ref 0.20–1.20)
Total Protein: 7.1 g/dL (ref 6.4–8.3)

## 2014-10-05 LAB — PROTIME-INR
INR: 3.9 — ABNORMAL HIGH (ref 2.00–3.50)
Protime: 46.8 Seconds — ABNORMAL HIGH (ref 10.6–13.4)

## 2014-10-05 LAB — POCT INR: INR: 3.9

## 2014-10-05 NOTE — Progress Notes (Signed)
INR above goal today. H/H=11.5/35.3 Plt=137000  [name] was D/C from hosptial on 10/01/14 on levofloxacin, which can elevate INR.  She had been admitted for hemoptysis secondary to PNA vs Pulmonary alveolar hemorrhage.  She has 2 more days of levofloxacin left to take.  She has an appt with pulmonology in 2 days.  She is still coughing up brown/dark red sputum but no new bright red blood.  Since INR elevated and still has 2 days of antibiotics remaining, will hold coumadin today and tomorrow and resume 5mg  on Wednesday and check PT/INR on Thursday.  [name] knows to call if any changes in color of sputum or any other s/s of bleeding.

## 2014-10-07 ENCOUNTER — Ambulatory Visit (INDEPENDENT_AMBULATORY_CARE_PROVIDER_SITE_OTHER)
Admission: RE | Admit: 2014-10-07 | Discharge: 2014-10-07 | Disposition: A | Discharge status: Home / Self Care | Payer: Medicare Other | Source: Ambulatory Visit | Attending: Adult Health | Admitting: Adult Health

## 2014-10-07 ENCOUNTER — Encounter: Payer: Self-pay | Admitting: Adult Health

## 2014-10-07 ENCOUNTER — Ambulatory Visit (INDEPENDENT_AMBULATORY_CARE_PROVIDER_SITE_OTHER): Payer: Medicare Other | Admitting: Adult Health

## 2014-10-07 VITALS — BP 122/70 | HR 82 | Temp 97.8°F | Ht 63.0 in | Wt 170.6 lb

## 2014-10-07 DIAGNOSIS — R0489 Hemorrhage from other sites in respiratory passages: Secondary | ICD-10-CM

## 2014-10-07 DIAGNOSIS — R042 Hemoptysis: Secondary | ICD-10-CM

## 2014-10-07 DIAGNOSIS — I259 Chronic ischemic heart disease, unspecified: Secondary | ICD-10-CM

## 2014-10-07 DIAGNOSIS — J189 Pneumonia, unspecified organism: Secondary | ICD-10-CM

## 2014-10-07 DIAGNOSIS — I82401 Acute embolism and thrombosis of unspecified deep veins of right lower extremity: Secondary | ICD-10-CM

## 2014-10-07 NOTE — Patient Instructions (Signed)
Continue follow up with coumadin clinic as planned  Call if develop any bleeding immediately.  Follow up Dr. Chase Caller in 2-3 weeks and As needed

## 2014-10-07 NOTE — Progress Notes (Signed)
Subjective:    Patient ID: [name], female    DOB: 09-Dec-1946, 68 y.o.   MRN: [id number]  HPI  68 year old with a cardiac history, HTN and diabetes presenting to the hospital for left knee replacement. On 10/1 patient was found to be tachycardic and hypoxic. CTA was performed that showed a pulmonary embolism and PCCM was called on consultation. Patient denies previous blood clots or family history of blood clots. Has not been on blood thinners in the past. No hemoptysis or chest pain.  SIGNIFICANT EVENTS / STUDIES:  10/1 >>> CTA with PE.  Oct 03, 2013 - duplex LE - negative DVT  10/03/2013 - ECHO - normal RV  Pulmonary arteries: Systolic pressure was mildly to moderately increased. PA peak pressure: 57mm Hg (S    ASSESSMENT / PLAN:  68 year old female with no previous blood clot history, currently on lovenox DVT prophylaxis who developed a provoked PE. Patient was on ticagrelor prior to surgery for stent placement. Communicated with ortho, ok to start anti-coagulant. This is a first PE that is provoked by surgery.   09/22/13: improved and stable to go home. Meets criteria 5d IV heparin + INR > 2 x 24h.No bleed. Looks well. Will be off brilinta. However, pulse ox 88% at Rest on ROom Air. So need 2L oxygen  Plan:  -DC heparin infusion; home 2 after dc (dc;ed at 13.50 approximately)  -Coumadin clinic; will have my office arrage  - -After provoked (post-ortho procedure) VTE, usual recommended duration is 3-6 months with 6 mos preferred if no problems with anticoagulation  -There is no reason to suspect a hypercoag state but apparently a panel has been sent already so will follow up on this  - START 2L o2 will monitor this at followup; but medicare refused   OV 10/01/13  Followup hospital followup. Recent pulmonary embolism provoked after orthopedic surgery despite Lovenox treatment. Other risk factors obesity  -Since discharge she is slowly gaining her physical conditioning. Chest  physical therapy working with her. She still feels fatigued. She still has dyspnea although this is improved. There no new complaints. Her INR is being checked. Today it was 5.7 and Coumadin is on hold. There no new complaints husband is with heer. She plans to start outpatient physical therapy next week.  - Walk test today on room air with a walker 185 x feet x 1 laps on RA: stopped  due to leg fatigue. NO desat below 93%  11/11/13 Follow up  6 week follow up PE - reports dyspnea is about the same since last ov.  Doing well. No hemoptysis, chest pain, orthopnea, or edema.  Going to the [facility] every 2 weeks. INR this am was 1.7. Coumadin was adjusted to reach goal 2-3 . Says no missed doses of coumadin.  DOE is improved .    OV 12/23/2013 FU PE  INR now consistently at goal > 2 and on stable regimen. Early Dec 2014 had LEf sidedSVT  Diagnosed by PMD with duplex. Saw Dr Julien Nordmann for 2nd opinion; recommended 1 year anticoagualation at minimum. Currently astymptomatic. Feels well. Has INR monitoring at [facility] through [name]. Wants to know how long anti coagulation and how to monitor for resolutioin  10/07/2014 [facility] follow up (hx of PE (dx 09/2013)  and multiple myeloma )  Admitted 10/12-10/15 for Hemoptysis , recently dx  DVT on coumadin w/ therapeutic INR   . CT showed bilateral aspdz concerning for alveolar hemorrhage vs PNA . INR  was 2.69 on admit .  Pulmonary recommended keeping INR closer to 2.0 range than 3.0 .  She was treated with abx. And discharged on levaquin.  She returns today imrpoved ,no further hemoptysis  Has follow up with oncology for myeloma tx.  Last INR on 10l19 was 3.9 , she is off coumadin . No known bleeding (hbg was stable )  Advised on need to get INR lower to avoid bleeding  CXR today is much improved with resolved aspdz.  Feels her cough is much better.  Has INR check for tomorrow.  Denies fever, chest pain, orthopnea , edema or n/v/d .     Review of  Systems  Constitutional:   No  weight loss, night sweats,  Fevers, chills, ++ fatigue, or  lassitude.  HEENT:   No headaches,  Difficulty swallowing,  Tooth/dental problems, or  Sore throat,                No sneezing, itching, ear ache, nasal congestion, post nasal drip,   CV:  No chest pain,  Orthopnea, PND, swelling in lower extremities, anasarca, dizziness, palpitations, syncope.   GI  No heartburn, indigestion, abdominal pain, nausea, vomiting, diarrhea, change in bowel habits, loss of appetite, bloody stools.   Resp:    No chest wall deformity  Skin: no rash or lesions.  GU: no dysuria, change in color of urine, no urgency or frequency.  No flank pain, no hematuria   MS:  No joint pain or swelling.  No decreased range of motion.  No back pain.  Psych:  No change in mood or affect. No depression or anxiety.  No memory loss.          Objective:   Physical Exam GEN: A/Ox3; pleasant , NAD, elderly   HEENT:  [place]/AT,  EACs-clear, TMs-wnl, NOSE-clear, THROAT-clear, no lesions, no postnasal drip or exudate noted.   NECK:  Supple w/ fair ROM; no JVD; normal carotid impulses w/o bruits; no thyromegaly or nodules palpated; no lymphadenopathy.  RESP  Decreased BS in bases  .no accessory muscle use, no dullness to percussion  CARD:  RRR, no m/r/g  , no peripheral edema, pulses intact, no cyanosis or clubbing.  GI:   Soft & nt; nml bowel sounds; no organomegaly or masses detected.  Musco: Warm bil, no deformities or joint swelling noted.   Neuro: alert, no focal deficits noted.    Skin: Warm, no lesions or rashes        Assessment & Plan:

## 2014-10-08 ENCOUNTER — Ambulatory Visit (HOSPITAL_BASED_OUTPATIENT_CLINIC_OR_DEPARTMENT_OTHER): Payer: Medicare Other | Admitting: Pharmacist

## 2014-10-08 ENCOUNTER — Other Ambulatory Visit (HOSPITAL_BASED_OUTPATIENT_CLINIC_OR_DEPARTMENT_OTHER): Payer: Medicare Other

## 2014-10-08 DIAGNOSIS — D689 Coagulation defect, unspecified: Secondary | ICD-10-CM

## 2014-10-08 DIAGNOSIS — Z86711 Personal history of pulmonary embolism: Secondary | ICD-10-CM

## 2014-10-08 DIAGNOSIS — C9000 Multiple myeloma not having achieved remission: Secondary | ICD-10-CM

## 2014-10-08 DIAGNOSIS — I82401 Acute embolism and thrombosis of unspecified deep veins of right lower extremity: Secondary | ICD-10-CM

## 2014-10-08 LAB — COMPREHENSIVE METABOLIC PANEL (CC13)
ALK PHOS: 46 U/L (ref 40–150)
ALT: 18 U/L (ref 0–55)
AST: 12 U/L (ref 5–34)
Albumin: 2.8 g/dL — ABNORMAL LOW (ref 3.5–5.0)
Anion Gap: 12 mEq/L — ABNORMAL HIGH (ref 3–11)
BILIRUBIN TOTAL: 0.85 mg/dL (ref 0.20–1.20)
BUN: 22.4 mg/dL (ref 7.0–26.0)
CO2: 25 mEq/L (ref 22–29)
Calcium: 8.9 mg/dL (ref 8.4–10.4)
Chloride: 102 mEq/L (ref 98–109)
Creatinine: 1.1 mg/dL (ref 0.6–1.1)
Glucose: 207 mg/dl — ABNORMAL HIGH (ref 70–140)
Potassium: 3.7 mEq/L (ref 3.5–5.1)
Sodium: 138 mEq/L (ref 136–145)
Total Protein: 7.1 g/dL (ref 6.4–8.3)

## 2014-10-08 LAB — POCT INR: INR: 1.9

## 2014-10-08 LAB — CBC WITH DIFFERENTIAL/PLATELET
BASO%: 0.1 % (ref 0.0–2.0)
Basophils Absolute: 0 10*3/uL (ref 0.0–0.1)
EOS%: 1.8 % (ref 0.0–7.0)
Eosinophils Absolute: 0.1 10*3/uL (ref 0.0–0.5)
HCT: 34.8 % (ref 34.8–46.6)
HGB: 11.4 g/dL — ABNORMAL LOW (ref 11.6–15.9)
LYMPH%: 11.7 % — AB (ref 14.0–49.7)
MCH: 28.1 pg (ref 25.1–34.0)
MCHC: 32.8 g/dL (ref 31.5–36.0)
MCV: 85.9 fL (ref 79.5–101.0)
MONO#: 1.8 10*3/uL — ABNORMAL HIGH (ref 0.1–0.9)
MONO%: 23.7 % — AB (ref 0.0–14.0)
NEUT#: 4.8 10*3/uL (ref 1.5–6.5)
NEUT%: 62.7 % (ref 38.4–76.8)
PLATELETS: 186 10*3/uL (ref 145–400)
RBC: 4.05 10*6/uL (ref 3.70–5.45)
RDW: 18.1 % — ABNORMAL HIGH (ref 11.2–14.5)
WBC: 7.6 10*3/uL (ref 3.9–10.3)
lymph#: 0.9 10*3/uL (ref 0.9–3.3)

## 2014-10-08 LAB — PROTIME-INR
INR: 1.9 — ABNORMAL LOW (ref 2.00–3.50)
PROTIME: 22.8 s — AB (ref 10.6–13.4)

## 2014-10-08 NOTE — Progress Notes (Signed)
INR right at goal today at 1.9 after holding x 2 days this week due to elevated INR Pt now off of antibiotic and restarted 5 mg daily yesterday No other issues or changes to report So bruising on arms still remains from hospital admission due to IV access but these are healing Pt has appointments next week with Dr. Jana Hakim so we will coordinate INR check on These dates Plan: Continue to Take coumadin 5mg  daily and check PT/INR on  Monday with Dr. Visit 10/12/14 at 9 am for lab, 9:15am for coumadin clinic and 9:30am visit with Dr. Jana Hakim.

## 2014-10-08 NOTE — Patient Instructions (Addendum)
INR at goal Continue to Take coumadin 5mg  daily and check PT/INR on  Monday with Dr. Visit 10/12/14 at 9 am for lab, 9:15am for coumadin clinic and 9:30am visit with Dr. Jana Hakim.

## 2014-10-12 ENCOUNTER — Ambulatory Visit: Payer: Medicare Other | Admitting: Pharmacist

## 2014-10-12 ENCOUNTER — Ambulatory Visit (HOSPITAL_BASED_OUTPATIENT_CLINIC_OR_DEPARTMENT_OTHER): Payer: Medicare Other | Admitting: Oncology

## 2014-10-12 ENCOUNTER — Other Ambulatory Visit (HOSPITAL_BASED_OUTPATIENT_CLINIC_OR_DEPARTMENT_OTHER): Payer: Medicare Other

## 2014-10-12 ENCOUNTER — Telehealth: Payer: Self-pay | Admitting: *Deleted

## 2014-10-12 ENCOUNTER — Telehealth: Payer: Self-pay | Admitting: Oncology

## 2014-10-12 VITALS — BP 141/66 | HR 78 | Temp 97.9°F | Resp 18 | Ht 63.0 in | Wt 172.1 lb

## 2014-10-12 DIAGNOSIS — I82401 Acute embolism and thrombosis of unspecified deep veins of right lower extremity: Secondary | ICD-10-CM

## 2014-10-12 DIAGNOSIS — D689 Coagulation defect, unspecified: Secondary | ICD-10-CM

## 2014-10-12 DIAGNOSIS — C9000 Multiple myeloma not having achieved remission: Secondary | ICD-10-CM

## 2014-10-12 DIAGNOSIS — Z86711 Personal history of pulmonary embolism: Secondary | ICD-10-CM

## 2014-10-12 DIAGNOSIS — D62 Acute posthemorrhagic anemia: Secondary | ICD-10-CM

## 2014-10-12 LAB — PROTIME-INR
INR: 4.1 — ABNORMAL HIGH (ref 2.00–3.50)
PROTIME: 49.2 s — AB (ref 10.6–13.4)

## 2014-10-12 LAB — COMPREHENSIVE METABOLIC PANEL (CC13)
ALK PHOS: 50 U/L (ref 40–150)
ALT: 21 U/L (ref 0–55)
AST: 23 U/L (ref 5–34)
Albumin: 2.7 g/dL — ABNORMAL LOW (ref 3.5–5.0)
Anion Gap: 10 mEq/L (ref 3–11)
BUN: 13.9 mg/dL (ref 7.0–26.0)
CO2: 25 mEq/L (ref 22–29)
CREATININE: 1 mg/dL (ref 0.6–1.1)
Calcium: 8.7 mg/dL (ref 8.4–10.4)
Chloride: 104 mEq/L (ref 98–109)
GLUCOSE: 180 mg/dL — AB (ref 70–140)
Potassium: 4 mEq/L (ref 3.5–5.1)
SODIUM: 139 meq/L (ref 136–145)
TOTAL PROTEIN: 7.2 g/dL (ref 6.4–8.3)
Total Bilirubin: 0.96 mg/dL (ref 0.20–1.20)

## 2014-10-12 LAB — CBC WITH DIFFERENTIAL/PLATELET
BASO%: 1 % (ref 0.0–2.0)
Basophils Absolute: 0.1 10*3/uL (ref 0.0–0.1)
EOS ABS: 0.4 10*3/uL (ref 0.0–0.5)
EOS%: 7.5 % — ABNORMAL HIGH (ref 0.0–7.0)
HCT: 36 % (ref 34.8–46.6)
HGB: 11.6 g/dL (ref 11.6–15.9)
LYMPH%: 10.7 % — AB (ref 14.0–49.7)
MCH: 28.1 pg (ref 25.1–34.0)
MCHC: 32.2 g/dL (ref 31.5–36.0)
MCV: 87.2 fL (ref 79.5–101.0)
MONO#: 0.6 10*3/uL (ref 0.1–0.9)
MONO%: 10.6 % (ref 0.0–14.0)
NEUT%: 70.2 % (ref 38.4–76.8)
NEUTROS ABS: 4.1 10*3/uL (ref 1.5–6.5)
PLATELETS: 191 10*3/uL (ref 145–400)
RBC: 4.13 10*6/uL (ref 3.70–5.45)
RDW: 18.8 % — ABNORMAL HIGH (ref 11.2–14.5)
WBC: 5.9 10*3/uL (ref 3.9–10.3)
lymph#: 0.6 10*3/uL — ABNORMAL LOW (ref 0.9–3.3)

## 2014-10-12 LAB — POCT INR: INR: 4.1

## 2014-10-12 MED ORDER — PROCHLORPERAZINE MALEATE 10 MG PO TABS
10.0000 mg | ORAL_TABLET | Freq: Four times a day (QID) | ORAL | Status: AC | PRN
Start: 2014-10-12 — End: ?

## 2014-10-12 MED ORDER — VALACYCLOVIR HCL 1 G PO TABS: 1000.0000 mg | ORAL_TABLET | Freq: Two times a day (BID) | ORAL | Status: DC

## 2014-10-12 NOTE — Telephone Encounter (Signed)
, °

## 2014-10-12 NOTE — Assessment & Plan Note (Signed)
Hemoptysis w/ aveolar hemorrhage vs PNA  Resolved , abx finished  cxr much improved  Cont on current regimen  Goal of INR near 2.0 range.

## 2014-10-12 NOTE — Progress Notes (Signed)
INR = 4.1 Pt was seen last Monday (10/19) and her INR was high then.  She held Coumadin Mon & Tues as directed then took Coumadin 5 mg daily. No bleeding.  She still has residual bruising from recent IV sticks in hospital.  No nosebleeds.  She occasionally has brown-tinged sputum but this is much improved overall. Pt c/o feeling "sick" this morning.  No pain. She has completed abx. Her appetite has decreased in the past 1-2 weeks.  She has to "force" herself to eat.  When she eats, she has vegetables (green beans, squash, peas, carrots and cabbage; she eats 2 spoonfuls weekly of cabbage).  She had a hotdog & hamburger yesterday w/ slaw (1 teaspoon) on the hotdog only. INR again is elevated. In the past 7 days she received 25 mg total of Coumadin. I will have her hold Coumadin today & tomorrow then take 2.5 mg daily until Fri (10/30) & we'll get another INR that day (due for tx; see in infusion). If INR is back at goal Friday, consider Coumadin 2.5 mg daily +/- 5 mg on Sat/Sun. Kennith Center, Pharm.D., CPP 10/12/2014@9 :32 AM

## 2014-10-12 NOTE — Assessment & Plan Note (Signed)
Cont on coumadin w/ goal near 2.0 range due to  Recent hemoptysis .  follow up with CC in am as planned

## 2014-10-12 NOTE — Telephone Encounter (Signed)
I have adjusted 11/3

## 2014-10-13 ENCOUNTER — Ambulatory Visit: Payer: Medicare Other

## 2014-10-13 ENCOUNTER — Ambulatory Visit (HOSPITAL_BASED_OUTPATIENT_CLINIC_OR_DEPARTMENT_OTHER): Payer: Medicare Other

## 2014-10-13 ENCOUNTER — Other Ambulatory Visit: Payer: Self-pay | Admitting: *Deleted

## 2014-10-13 ENCOUNTER — Encounter: Payer: Self-pay | Admitting: Pharmacist

## 2014-10-13 ENCOUNTER — Other Ambulatory Visit: Payer: Medicare Other

## 2014-10-13 ENCOUNTER — Other Ambulatory Visit (HOSPITAL_BASED_OUTPATIENT_CLINIC_OR_DEPARTMENT_OTHER): Payer: Medicare Other

## 2014-10-13 ENCOUNTER — Telehealth: Payer: Self-pay | Admitting: *Deleted

## 2014-10-13 VITALS — BP 119/64 | HR 80 | Temp 98.0°F | Resp 18 | Ht 63.0 in

## 2014-10-13 DIAGNOSIS — C9000 Multiple myeloma not having achieved remission: Secondary | ICD-10-CM

## 2014-10-13 DIAGNOSIS — Z5112 Encounter for antineoplastic immunotherapy: Secondary | ICD-10-CM

## 2014-10-13 DIAGNOSIS — R040 Epistaxis: Secondary | ICD-10-CM

## 2014-10-13 DIAGNOSIS — I82401 Acute embolism and thrombosis of unspecified deep veins of right lower extremity: Secondary | ICD-10-CM

## 2014-10-13 DIAGNOSIS — I2699 Other pulmonary embolism without acute cor pulmonale: Secondary | ICD-10-CM

## 2014-10-13 DIAGNOSIS — R042 Hemoptysis: Secondary | ICD-10-CM

## 2014-10-13 DIAGNOSIS — D689 Coagulation defect, unspecified: Secondary | ICD-10-CM

## 2014-10-13 LAB — CBC WITH DIFFERENTIAL/PLATELET
BASO%: 0.2 % (ref 0.0–2.0)
BASOS ABS: 0 10*3/uL (ref 0.0–0.1)
EOS%: 4.7 % (ref 0.0–7.0)
Eosinophils Absolute: 0.3 10*3/uL (ref 0.0–0.5)
HCT: 35 % (ref 34.8–46.6)
HEMOGLOBIN: 11.3 g/dL — AB (ref 11.6–15.9)
LYMPH%: 8.1 % — ABNORMAL LOW (ref 14.0–49.7)
MCH: 28.2 pg (ref 25.1–34.0)
MCHC: 32.3 g/dL (ref 31.5–36.0)
MCV: 87.3 fL (ref 79.5–101.0)
MONO#: 0.4 10*3/uL (ref 0.1–0.9)
MONO%: 5.9 % (ref 0.0–14.0)
NEUT#: 5 10*3/uL (ref 1.5–6.5)
NEUT%: 81.1 % — ABNORMAL HIGH (ref 38.4–76.8)
Platelets: 187 10*3/uL (ref 145–400)
RBC: 4.01 10*6/uL (ref 3.70–5.45)
RDW: 17.7 % — AB (ref 11.2–14.5)
WBC: 6.1 10*3/uL (ref 3.9–10.3)
lymph#: 0.5 10*3/uL — ABNORMAL LOW (ref 0.9–3.3)

## 2014-10-13 LAB — PROTIME-INR
INR: 3.1 (ref 2.00–3.50)
Protime: 37.2 Seconds — ABNORMAL HIGH (ref 10.6–13.4)

## 2014-10-13 MED ORDER — ONDANSETRON HCL 8 MG PO TABS
8.0000 mg | ORAL_TABLET | Freq: Once | ORAL | Status: AC
  Administered 2014-10-13: 8 mg via ORAL

## 2014-10-13 MED ORDER — BORTEZOMIB CHEMO SQ INJECTION 3.5 MG (2.5MG/ML)
1.3000 mg/m2 | Freq: Once | INTRAMUSCULAR | Status: AC
  Administered 2014-10-13: 2.5 mg via SUBCUTANEOUS
  Filled 2014-10-13: qty 2.5

## 2014-10-13 MED ORDER — ONDANSETRON HCL 8 MG PO TABS
ORAL_TABLET | ORAL | Status: AC
  Filled 2014-10-13: qty 1

## 2014-10-13 NOTE — Progress Notes (Signed)
INR=3.1 today.  [name] still having some blood in sputum as reported by Tivis Ringer, Therapist, sports.  Dr Jana Hakim has instructed [name] to hold coumadin until next coumadin clinic appt on 10/16/14.

## 2014-10-13 NOTE — Patient Instructions (Signed)
[facility] Discharge Instructions for Patients Receiving Chemotherapy  Today you received the following chemotherapy agents Velcade SQ  To help prevent nausea and vomiting after your treatment, we encourage you to take your nausea medication Compazine 10 mg every 6 hours as needed.   If you develop nausea and vomiting that is not controlled by your nausea medication, call the clinic.   BELOW ARE SYMPTOMS THAT SHOULD BE REPORTED IMMEDIATELY:  *FEVER GREATER THAN 100.5 F  *CHILLS WITH OR WITHOUT FEVER  NAUSEA AND VOMITING THAT IS NOT CONTROLLED WITH YOUR NAUSEA MEDICATION  *UNUSUAL SHORTNESS OF BREATH  *UNUSUAL BRUISING OR BLEEDING  TENDERNESS IN MOUTH AND THROAT WITH OR WITHOUT PRESENCE OF ULCERS  *URINARY PROBLEMS  *BOWEL PROBLEMS  UNUSUAL RASH Items with * indicate a potential emergency and should be followed up as soon as possible.  Feel free to call the clinic you have any questions or concerns. The clinic phone number is [phone].

## 2014-10-13 NOTE — Telephone Encounter (Signed)
THE BLOOD IS STREAKED IN HER SPUTUM. PT. IS ON COUMADIN. HER INR YESTERDAY WAS HIGH AT 4.1. SHE WAS INSTRUCTED TO HOLD HER COUMADIN LAST NIGHT [WHICH PT. DID] AND HOLD COUMADIN TONIGHT. SHE IS TO RESTART COUMADIN AT 2.5MG  ON Wednesday, Thursday, AND Friday. PT. IS COMING TO THIS OFFICE FOR AN INFUSION AT 11:00AM.  NOTIFIED DR.MAGRINAT'S NURSE, VAL DODD,RN. SHE WILL PUT IN LAB ORDERS AND SEE PT. AFTER BLOOD WORK IS DONE. NOTIFIED PT. OF THE ABOVE INFORMATION. SHE VOICES UNDERSTANDING.

## 2014-10-14 NOTE — Progress Notes (Signed)
[facility]  [phone]) [phone] [phone]) [phone]     ID: [name] OB: 06-Dec-1946  MR#: [id number]  IRC#:[id number]  PCP: Jenny Reichmann, MD GYN:   SU:  OTHER MD: Jodi Marble, MD;  Darlin Coco, MD  CHIEF COMPLAINT: Multiple myeloma, under active treatment CURRENT TREATMENT: dexamethasone, bortezomib, lenalidomide (on hold), zometa  MYELOMA HISTORY: From the original consult note, dated 03/24/2014:  "The patient has a history of PE following arthroscopic surgery,as well as LLE DVT requiring Coumadin therapy since October of 2014. On 4/7 she presented to the ED with a 2 day history of hemoptysis and mucous material. INR was elevated at 6 requiring FFP and Vit K with improvement of coagulopathy (INR 1.4 this morning). Anticoagulation is on hold until hemoptysis resolves.CT angiogram on 4/7 was negative for pulmonary embolus. Bilateral pneumonia was suspected, requiring IV antibiotics.  Interestingly, diffusely heterogeneous appearance to the visualized bones were seen, more notable than on a prior CT in 2010. Bone scan on 4/8 showed old fractures in posterior 8-9 th ribs, and in a lumbar XR a new lumbar spinal fracture was noted, not picked up by the bone scan. Given the mottled appearance of the CT films,workup for multiple myeloma was initiated: She had anemia in the setting of chronic disease, iron deficiency and blood loss, and mild renal insufficiency was seen with a Cr 1.62. Sodium was normal. Her Calcium however was 12.3 on admission, today at greater than 15 receiving Calcitonin 100U nasal spray, Zometa 4 mg and hydration IV. SPEP / UPEP / IFE are pending. "  Her subsequent history is as detailed below.  INTERVAL HISTORY: [name] returns today for follow up of her multiple myeloma, accompanied by her husband. Today is day 21, cycle 8 of her subcutaneous bortezomib which she receives on days 1, 4, 8, and 11 of each  21 day cycle. She also takes oral  dexamethasone, 20 mg every Tuesday and Wednesday.  She has been on zoledronic acid, most recent dose 09/25/2014. Her lenalidomide had been held since mid-September but was resumed about 3 weeks ago--today is day 19 of her 28 day cycle, with lenalidomide given days 1 through 21.  REVIEW OF SYSTEMS: [name] remains very anxious. She tells me she is better overall although she has some tingling and burning sometimes in her left foot. This neuropathy is waxing and waning. It seems to be worse with walking at least sometimes. She also has pain in her right shoulder, and again in constant. Her back itches. She has a rash on her for head. She feels short of breath with almost any activity. Her appetite is poor. She tells me she is doing some cooking, making her bed, and tries to go out almost every day. She will complete her 3 weeks of lenalidomide 2 days from now. She denies constipation, somnolence, nausea, or vomiting. She has not had fever, dizziness, or falls. She has occasionally some hemoptysis or epistaxis, but this is now minimal. She tells me the acyclovir causes diarrhea and I have switched that to Valtrex. A detailed review of systems today was otherwise stable  PAST MEDICAL HISTORY: Past Medical History  Diagnosis Date  . Neurofibromatosis   . HTN (hypertension)   . Obesity   . Hyperlipidemia     statin intolerant. LDL is 83  . UTI (lower urinary tract infection) 05/29/12  . Pulmonary embolism 09/17/2013  . STEMI (ST elevation myocardial infarction) 05/29/2012    Inferolateral STEMI ; s/p DES to RCA,  nl EF  . Multiple myeloma     Dr. Jana Hakim   . DVT (deep venous thrombosis) 08/2014    RLE  . Type II diabetes mellitus   . History of blood transfusion ?2015    "blood count dropped real low"  . Osteoarthritis, knee   . Arthritis     "shoulders hurt" (09/30/2014)    PAST SURGICAL HISTORY: Past Surgical History  Procedure Laterality Date  . Clavicle surgery Left ~ 1960  . Ankle  fracture surgery Right ?1990's  . Total knee arthroplasty Left 09/15/2013    Procedure: TOTAL KNEE ARTHROPLASTY;  Surgeon: Vickey Huger, MD;  Location: [facility];  Service: Orthopedics;  Laterality: Left;  . Hernia repair    . Coronary angioplasty with stent placement  05/2012    "1"  . Tonsillectomy and adenoidectomy  1950's  . Fracture surgery    . Bone marrow biopsy  03/2014    Dr. Jana Hakim   . Vaginal hysterectomy  1990's    FAMILY HISTORY Family History  Problem Relation Age of Onset  . Heart disease Mother   . Arthritis Mother   . Stroke Mother   . Heart disease Son     SOCIAL HISTORY:    (reviewed 05/19/2014)  Married. Lives in [place] with husband of 68 years. 2 sons in good health.      ADVANCED DIRECTIVES:    HEALTH MAINTENANCE: (updated 05/19/2014)  History  Substance Use Topics  . Smoking status: Never Smoker   . Smokeless tobacco: Never Used  . Alcohol Use: No     Colonoscopy:  not on file   PAP: not on file   Bone density: not on file   Lipid panel: December 2014   Allergies  Allergen Reactions  . Codeine Nausea And Vomiting    Sees things  . Hydrocodone Nausea And Vomiting    Sees things  . Niaspan [Niacin Er] Nausea Only  . Robaxin [Methocarbamol] Other (See Comments)    Made patient feel funny  . Statins Nausea And Vomiting and Other (See Comments)    Myalgias/leg pain with atorvastatin, rosuvastatin, lovastatin, simvastatin  . Tetracycline Other (See Comments)    Pt doesn't remember reaction  . Zetia [Ezetimibe] Nausea And Vomiting  . Zithromax [Azithromycin] Nausea And Vomiting    Current Outpatient Prescriptions  Medication Sig Dispense Refill  . acetaminophen (TYLENOL) 325 MG tablet Take 1-2 tablets (325-650 mg total) by mouth every 4 (four) hours as needed for mild pain.      . cetirizine (ZYRTEC) 10 MG tablet Take 10 mg by mouth daily.      Marland Kitchen dexamethasone (DECADRON) 4 MG tablet Take 20 mg by mouth as directed. 5 pills on Tuesday,  Wednesday only      . glimepiride (AMARYL) 2 MG tablet Take 2 tablets by mouth every morning with breakfast.  On the days that you take prednisone or have chemotherapy take another dose at the evening meal  90 tablet  1  . glucose blood (ONE TOUCH ULTRA TEST) test strip 1 each by Other route 3 (three) times daily.  100 each  12  . Lancets MISC Check glucose 3 times daily  100 each  6  . lenalidomide (REVLIMID) 10 MG capsule Take 1 capsule (10 mg total) by mouth daily.  21 capsule  4  . metoprolol tartrate (LOPRESSOR) 25 MG tablet Take 25 mg by mouth 2 (two) times daily.      . metoprolol tartrate (LOPRESSOR) 25 MG tablet TAKE  1 TABLET BY MOUTH TWICE DAILY  60 tablet  5  . Multiple Vitamin (MULTIVITAMIN WITH MINERALS) TABS tablet Take 1 tablet by mouth at bedtime. Centrum Silver      . Multiple Vitamins-Minerals (OCUVITE PO) Take 1 tablet by mouth daily.       Marland Kitchen NITROSTAT 0.4 MG SL tablet DISSOLVE 1 TABLET UNDER THE TONGUE EVERY 5 MINUTES AS NEEDED FOR CHEST PAIN. UP TO 3 DOSES  25 tablet  0  . nystatin (MYCOSTATIN/NYSTOP) 100000 UNIT/GM POWD Apply 1 g topically 3 (three) times daily as needed.  30 g  1  . polyethylene glycol (MIRALAX / GLYCOLAX) packet Take 17 g by mouth daily.      . prochlorperazine (COMPAZINE) 10 MG tablet Take 1 tablet (10 mg total) by mouth every 6 (six) hours as needed (Nausea or vomiting).  90 tablet  4  . traMADol (ULTRAM) 50 MG tablet Take 1 tablet (50 mg total) by mouth every 6 (six) hours as needed for moderate pain.  30 tablet  2  . valACYclovir (VALTREX) 1000 MG tablet Take 1 tablet (1,000 mg total) by mouth 2 (two) times daily.  30 tablet  4  . warfarin (COUMADIN) 5 MG tablet Take 1 tablet (5 mg total) by mouth daily.  1 tablet  0   No current facility-administered medications for this visit.    OBJECTIVE: Middle-aged white woman without appears frail Filed Vitals:   10/12/14 0948  BP: 141/66  Pulse: 78  Temp: 97.9 F (36.6 C)  Resp: 18     Body mass index  is 30.49 kg/(m^2).    ECOG FS:2 - Symptomatic, <50% confined to bed [facility] Weights   10/12/14 0948  Weight: 172 lb 1.6 oz (78.064 kg)   Sclerae unicteric, EOMs intact Oropharynx clear - no thrush or other lesions No cervical or supraclavicular adenopathy Lungs no rales or rhonchi Heart regular rate and rhythm Abd soft, obese, nontender, positive bowel sounds MSK no focal spinal tenderness, no upper extremity lymphedema Neuro: nonfocal, well oriented, anxious affect   LAB RESULTS:  Lab Results  Component Value Date   WBC 6.1 10/13/2014   NEUTROABS 5.0 10/13/2014   HGB 11.3* 10/13/2014   HCT 35.0 10/13/2014   MCV 87.3 10/13/2014   PLT 187 10/13/2014      Chemistry      Component Value Date/Time   NA 139 10/12/2014 0848   NA 138 09/29/2014 0533   K 4.0 10/12/2014 0848   K 4.3 09/29/2014 0533   CL 99 09/29/2014 0533   CO2 25 10/12/2014 0848   CO2 26 09/29/2014 0533   BUN 13.9 10/12/2014 0848   BUN 12 09/29/2014 0533   CREATININE 1.0 10/12/2014 0848   CREATININE 0.98 09/29/2014 0533   CREATININE 0.81 12/09/2013 1000   GLU 173 11/08/2010      Component Value Date/Time   CALCIUM 8.7 10/12/2014 0848   CALCIUM 8.0* 09/29/2014 0533   CALCIUM 11.1* 03/25/2014 0850   ALKPHOS 50 10/12/2014 0848   ALKPHOS 56 09/29/2014 0533   AST 23 10/12/2014 0848   AST 19 09/29/2014 0533   ALT 21 10/12/2014 0848   ALT 16 09/29/2014 0533   BILITOT 0.96 10/12/2014 0848   BILITOT 0.8 09/29/2014 0533      STUDIES: Dg Chest 2 View  10/07/2014   CLINICAL DATA:  Pneumonia, follow-up, shortness breath and cough  EXAM: CHEST  2 VIEW  COMPARISON:  CT angio chest of 09/29/2014 and chest x-ray of 09/28/2014  FINDINGS:  The airspace disease described on CT is not definitely seen on the current chest x-ray and may have cleared. Mild linear scarring or atelectasis is noted at the left lung base. No pleural effusion is seen. The heart is within upper limits of normal. No acute bony abnormality is noted.   IMPRESSION: Appearing clearing of airspace disease noted by prior CT primarily within the upper lobes. Linear scarring or atelectasis is noted at the left lung base.   Electronically Signed   By: Ivar Drape M.D.   On: 10/07/2014 09:33   Dg Chest 2 View (if Patient Has Fever And/or Copd)  09/28/2014   CLINICAL DATA:  Hemoptysis. Anti coagulation. Diabetes. Neurofibromatosis. Multiple myeloma.  EXAM: CHEST  2 VIEW  COMPARISON:  04/13/2014  FINDINGS: Low lung volumes are present, causing crowding of the pulmonary vasculature. Patchy airspace opacities in the lingula with bandlike opacities in the lower lobes. Indistinct airspace opacity in the upper lobes are mildly increased from 04/13/2014.  There are 2 adjacent 50-60% compression fractures at the thoracolumbar junction which appear to be new compared to 04/13/14.  IMPRESSION: 1. Two adjacent new compression fractures at the thoracolumbar junction. 2. Indistinct airspace opacities in the upper lobes, lingula, and left lower lobe, mildly increased from prior, possibly from pulmonary hemorrhage or multi lobar pneumonia.   Electronically Signed   By: Sherryl Barters M.D.   On: 09/28/2014 19:26   Ct Angio Chest Pe W/cm &/or Wo Cm  09/29/2014   CLINICAL DATA:  Hemoptysis for 2 days. On Coumadin. Evaluate for pulmonary hemorrhage. Initial encounter.  EXAM: CT ANGIOGRAPHY CHEST WITH CONTRAST  TECHNIQUE: Multidetector CT imaging of the chest was performed using the standard protocol during bolus administration of intravenous contrast. Multiplanar CT image reconstructions and MIPs were obtained to evaluate the vascular anatomy.  CONTRAST:  146m OMNIPAQUE IOHEXOL 350 MG/ML SOLN  COMPARISON:  03/24/2014  FINDINGS: THORACIC INLET/BODY WALL:  No acute abnormality.  MEDIASTINUM:  No cardiomegaly or pericardial effusion. RCA atherosclerosis or stenting. There is intermittent motion, especially at the bases, which decreases sensitivity for detecting pulmonary embolism at  the affected levels. Overall, the examination is diagnostic. No evidence of pulmonary embolism. Negative aorta. Elevation of the right diaphragm suggesting eventration.  LUNG WINDOWS:  Fairly symmetric, central predominant and upper lobe predominant airspace disease. Cyst slightly more extensive and dense on the left. No interlobular septal thickening, effusion, or pneumothorax.  UPPER ABDOMEN:  No acute findings.  OSSEOUS:  No acute fracture.  No suspicious lytic or blastic lesions.  Review of the MIP images confirms the above findings.  IMPRESSION: 1. Bilateral airspace disease which could reflect alveolar hemorrhage or multi focal pneumonia. 2. No evidence of pulmonary embolism.   Electronically Signed   By: JJorje GuildM.D.   On: 09/29/2014 02:09    ASSESSMENT: 68y.o..[place] presenting with hypercalcemia, anemia and mottled bone lesions April 2015, M-spike 3.82 g, IFE showing IgA/kappa myeloma with bone marrow biopsy 03/30/2014 with 88% plasma cells, FISH  t(4,14), deletion 13 and deletion 11 (intermediate risk); initial beta-2 microglobulin 16.  (1) considered E1A11 study, but unable to tolerate anticoagulation  (2) associated issues:  (a) coagulopathy: history of post-op PE; history of bleeding with warfarin; epistaxis with  ASA 325 ms/ day; switched to 81 mg/ day with fair tolerance  (c) hypercalcemia: started zolendronate 03/26/2014, to be repeated monthly  (d) pain: compression fracture L1, rib fractures: on tylenol and tramadol  (e) immunocompromise: on acyclovir and Septra prophylaxis  (3)  started dexamethasone 40 mg/ week (20 mg po on Tues and Weds April 7654); complicated by hyperglycemia with history of diabetes, typically followed by Dr. Everlene Farrier  (4) started bortezomib SQ 04/28/2014, to be given days 1,4, 8 and 11 of ech 21 day cycle  (5) starting lenalidomide first week in September, held after 1 cycle with poor tolerance; resumed dose reduced 09/30/2014  PLAN: [name]  is tolerating treatment more all less well. We need to repeat her basic labs to make sure that we are continuing to get a response and not beginning to fall back. I am writing for SPEP and kappa/ lambda light chains.If itdoes suggest disease progression we will consider repeating a bone marrow biopsy and changing treatment. In the meantime she is proceeding with bortezomib tomorrow, beginning her ninth cycle. we are continuing on her Coumadin, despite the occasional bleeding problems, and she is tolerating that better. I have encouraged her to be as active as possible. she knows to call for any problems that may develop before her next visit here.  Chauncey Cruel, MD   10/14/2014 7:57 PM

## 2014-10-15 ENCOUNTER — Ambulatory Visit: Payer: Medicare Other | Admitting: Emergency Medicine

## 2014-10-16 ENCOUNTER — Ambulatory Visit (HOSPITAL_BASED_OUTPATIENT_CLINIC_OR_DEPARTMENT_OTHER): Payer: Medicare Other | Admitting: Pharmacist

## 2014-10-16 ENCOUNTER — Other Ambulatory Visit (HOSPITAL_BASED_OUTPATIENT_CLINIC_OR_DEPARTMENT_OTHER): Payer: Medicare Other

## 2014-10-16 ENCOUNTER — Ambulatory Visit (HOSPITAL_BASED_OUTPATIENT_CLINIC_OR_DEPARTMENT_OTHER): Payer: Medicare Other

## 2014-10-16 VITALS — BP 133/63 | HR 69 | Temp 98.0°F | Resp 19

## 2014-10-16 DIAGNOSIS — C9000 Multiple myeloma not having achieved remission: Secondary | ICD-10-CM

## 2014-10-16 DIAGNOSIS — D689 Coagulation defect, unspecified: Secondary | ICD-10-CM

## 2014-10-16 DIAGNOSIS — Z5112 Encounter for antineoplastic immunotherapy: Secondary | ICD-10-CM

## 2014-10-16 DIAGNOSIS — I82401 Acute embolism and thrombosis of unspecified deep veins of right lower extremity: Secondary | ICD-10-CM

## 2014-10-16 LAB — COMPREHENSIVE METABOLIC PANEL (CC13)
ALT: 23 U/L (ref 0–55)
AST: 17 U/L (ref 5–34)
Albumin: 2.8 g/dL — ABNORMAL LOW (ref 3.5–5.0)
Alkaline Phosphatase: 44 U/L (ref 40–150)
Anion Gap: 10 mEq/L (ref 3–11)
BILIRUBIN TOTAL: 0.92 mg/dL (ref 0.20–1.20)
BUN: 18.6 mg/dL (ref 7.0–26.0)
CALCIUM: 8.3 mg/dL — AB (ref 8.4–10.4)
CHLORIDE: 104 meq/L (ref 98–109)
CO2: 26 mEq/L (ref 22–29)
Creatinine: 1 mg/dL (ref 0.6–1.1)
Glucose: 184 mg/dl — ABNORMAL HIGH (ref 70–140)
Potassium: 4.1 mEq/L (ref 3.5–5.1)
Sodium: 139 mEq/L (ref 136–145)
Total Protein: 6.7 g/dL (ref 6.4–8.3)

## 2014-10-16 LAB — CBC WITH DIFFERENTIAL/PLATELET
BASO%: 0.6 % (ref 0.0–2.0)
Basophils Absolute: 0 10*3/uL (ref 0.0–0.1)
EOS%: 5.8 % (ref 0.0–7.0)
Eosinophils Absolute: 0.3 10*3/uL (ref 0.0–0.5)
HCT: 32.8 % — ABNORMAL LOW (ref 34.8–46.6)
HGB: 10.5 g/dL — ABNORMAL LOW (ref 11.6–15.9)
LYMPH#: 0.7 10*3/uL — AB (ref 0.9–3.3)
LYMPH%: 16 % (ref 14.0–49.7)
MCH: 27.8 pg (ref 25.1–34.0)
MCHC: 31.9 g/dL (ref 31.5–36.0)
MCV: 87.2 fL (ref 79.5–101.0)
MONO#: 1 10*3/uL — ABNORMAL HIGH (ref 0.1–0.9)
MONO%: 22.8 % — ABNORMAL HIGH (ref 0.0–14.0)
NEUT#: 2.5 10*3/uL (ref 1.5–6.5)
NEUT%: 54.8 % (ref 38.4–76.8)
Platelets: 172 10*3/uL (ref 145–400)
RBC: 3.76 10*6/uL (ref 3.70–5.45)
RDW: 18.4 % — ABNORMAL HIGH (ref 11.2–14.5)
WBC: 4.5 10*3/uL (ref 3.9–10.3)

## 2014-10-16 LAB — PROTIME-INR
INR: 1.4 — ABNORMAL LOW (ref 2.00–3.50)
Protime: 16.8 Seconds — ABNORMAL HIGH (ref 10.6–13.4)

## 2014-10-16 LAB — KAPPA/LAMBDA LIGHT CHAINS
KAPPA FREE LGHT CHN: 16.9 mg/dL — AB (ref 0.33–1.94)
Kappa:Lambda Ratio: 105.63 — ABNORMAL HIGH (ref 0.26–1.65)
LAMBDA FREE LGHT CHN: 0.16 mg/dL — AB (ref 0.57–2.63)

## 2014-10-16 LAB — SPEP & IFE WITH QIG
ALBUMIN ELP: 42.2 % — AB (ref 55.8–66.1)
Alpha-1-Globulin: 4.3 % (ref 2.9–4.9)
Alpha-2-Globulin: 8 % (ref 7.1–11.8)
BETA GLOBULIN: 5.9 % (ref 4.7–7.2)
Beta 2: 37.5 % — ABNORMAL HIGH (ref 3.2–6.5)
Gamma Globulin: 2.1 % — ABNORMAL LOW (ref 11.1–18.8)
IGA: 2310 mg/dL — AB (ref 69–380)
IGG (IMMUNOGLOBIN G), SERUM: 223 mg/dL — AB (ref 690–1700)
IgM, Serum: 6 mg/dL — ABNORMAL LOW (ref 52–322)
M-Spike, %: 0.26 g/dL
Total Protein, Serum Electrophoresis: 7.2 g/dL (ref 6.0–8.3)

## 2014-10-16 LAB — POCT INR: INR: 1.4

## 2014-10-16 MED ORDER — BORTEZOMIB CHEMO SQ INJECTION 3.5 MG (2.5MG/ML)
1.3000 mg/m2 | Freq: Once | INTRAMUSCULAR | Status: AC
  Administered 2014-10-16: 2.5 mg via SUBCUTANEOUS
  Filled 2014-10-16: qty 2.5

## 2014-10-16 MED ORDER — ONDANSETRON HCL 8 MG PO TABS
8.0000 mg | ORAL_TABLET | Freq: Once | ORAL | Status: AC
  Administered 2014-10-16: 8 mg via ORAL

## 2014-10-16 MED ORDER — ONDANSETRON HCL 8 MG PO TABS
ORAL_TABLET | ORAL | Status: AC
  Filled 2014-10-16: qty 1

## 2014-10-16 NOTE — Patient Instructions (Signed)
Resume coumadin 2.5mg  daily (1/2 tablet).   Recheck INR with scheduled appts on 10/20/14; lab at 12pm, MD at 12:30pm, Treatment at 1:30pm and coumadin clinic at 1:45pm. A pharmacist will see you in the infusion area.

## 2014-10-16 NOTE — Progress Notes (Signed)
INR below goal today. Hg/hct:  10.5/32.8, Pltc: 172 Coumadin has been on hold since 10/26 due to elevated INR and blood in sputum. No further blood in sputum since 10/14/14. No other bleeding noted. Pt has some petechiae on face and legs. No s/s of clotting noted. No changes in diet or medications. Pt feels coumadin 5mg  daily is too much. Will resume coumadin today at a lower dose. Pt feels more comfortable with 2.5mg  daily. Resume coumadin 2.5mg  daily (1/2 tablet).   Recheck INR with scheduled appts on 10/20/14; lab at 12pm, MD at 12:30pm, Treatment at 1:30pm and coumadin clinic at 1:45pm. If blood in sputum occurs again over the weekend, patient knows to hold coumadin and call us on Monday (11/2) to let us know. Patient will see Dr. Jana Hakim on 10/20/14.

## 2014-10-16 NOTE — Patient Instructions (Signed)
[facility] Discharge Instructions for Patients Receiving Chemotherapy  Today you received the following chemotherapy agents: Velcade  To help prevent nausea and vomiting after your treatment, we encourage you to take your nausea medication as prescribed by your physician.   If you develop nausea and vomiting that is not controlled by your nausea medication, call the clinic.   BELOW ARE SYMPTOMS THAT SHOULD BE REPORTED IMMEDIATELY:  *FEVER GREATER THAN 100.5 F  *CHILLS WITH OR WITHOUT FEVER  NAUSEA AND VOMITING THAT IS NOT CONTROLLED WITH YOUR NAUSEA MEDICATION  *UNUSUAL SHORTNESS OF BREATH  *UNUSUAL BRUISING OR BLEEDING  TENDERNESS IN MOUTH AND THROAT WITH OR WITHOUT PRESENCE OF ULCERS  *URINARY PROBLEMS  *BOWEL PROBLEMS  UNUSUAL RASH Items with * indicate a potential emergency and should be followed up as soon as possible.  Feel free to call the clinic you have any questions or concerns. The clinic phone number is [phone].    

## 2014-10-19 ENCOUNTER — Encounter: Payer: Self-pay | Admitting: Emergency Medicine

## 2014-10-19 ENCOUNTER — Ambulatory Visit (INDEPENDENT_AMBULATORY_CARE_PROVIDER_SITE_OTHER): Payer: Medicare Other | Admitting: Emergency Medicine

## 2014-10-19 VITALS — BP 127/70 | HR 70 | Temp 98.3°F | Resp 16 | Ht 63.0 in | Wt 171.0 lb

## 2014-10-19 DIAGNOSIS — E119 Type 2 diabetes mellitus without complications: Secondary | ICD-10-CM

## 2014-10-19 LAB — POCT GLYCOSYLATED HEMOGLOBIN (HGB A1C): Hemoglobin A1C: 6.2

## 2014-10-19 NOTE — Progress Notes (Signed)
   Subjective:    Patient ID: [name], female    DOB: 02-Oct-1946, 68 y.o.   MRN: [id number]  HPIpatient here to follow-up on her diabetes. She has glucose spikes on the days she takes her prednisone but otherwise has been doing well. She has had recent problems with her Coumadin being at to high level. She is working with the Coumadin clinic regarding this. She is due to see Dr. Randell Patient not tomorrow regarding an update on her myeloma treatment.    Review of Systems     Objective:   Physical Exampatient is tearful but in no distress. Her neck is supple. Chest is clear to both auscultation and percussion. Heart regular rate and rhythm. Extremities without edema. There are some bruising areas on the arms and legs.    Results for orders placed or performed in visit on 10/19/14  POCT glycosylated hemoglobin (Hb A1C)  Result Value Ref Range   Hemoglobin A1C 6.2    Assessment & Plan:  No flu shot or pneumonia update given because she needs clearance from Dr. Jana Hakim. Her hemoglobin A1c is 6.2 which is excellent so no changes were made regarding her diabetes.

## 2014-10-20 ENCOUNTER — Other Ambulatory Visit (HOSPITAL_BASED_OUTPATIENT_CLINIC_OR_DEPARTMENT_OTHER): Payer: Medicare Other

## 2014-10-20 ENCOUNTER — Ambulatory Visit: Payer: Medicare Other | Admitting: Pharmacist

## 2014-10-20 ENCOUNTER — Ambulatory Visit (HOSPITAL_BASED_OUTPATIENT_CLINIC_OR_DEPARTMENT_OTHER): Payer: Medicare Other

## 2014-10-20 ENCOUNTER — Telehealth: Payer: Self-pay | Admitting: Oncology

## 2014-10-20 ENCOUNTER — Ambulatory Visit (HOSPITAL_BASED_OUTPATIENT_CLINIC_OR_DEPARTMENT_OTHER): Payer: Medicare Other | Admitting: Oncology

## 2014-10-20 ENCOUNTER — Other Ambulatory Visit: Payer: Medicare Other

## 2014-10-20 ENCOUNTER — Other Ambulatory Visit: Payer: Self-pay | Admitting: Emergency Medicine

## 2014-10-20 VITALS — BP 127/68 | HR 83 | Temp 98.4°F | Resp 18 | Ht 63.0 in | Wt 171.3 lb

## 2014-10-20 DIAGNOSIS — D689 Coagulation defect, unspecified: Secondary | ICD-10-CM

## 2014-10-20 DIAGNOSIS — C9000 Multiple myeloma not having achieved remission: Secondary | ICD-10-CM

## 2014-10-20 DIAGNOSIS — R197 Diarrhea, unspecified: Secondary | ICD-10-CM

## 2014-10-20 DIAGNOSIS — Z5112 Encounter for antineoplastic immunotherapy: Secondary | ICD-10-CM

## 2014-10-20 DIAGNOSIS — I82401 Acute embolism and thrombosis of unspecified deep veins of right lower extremity: Secondary | ICD-10-CM

## 2014-10-20 DIAGNOSIS — D62 Acute posthemorrhagic anemia: Secondary | ICD-10-CM

## 2014-10-20 LAB — COMPREHENSIVE METABOLIC PANEL (CC13)
ALBUMIN: 2.8 g/dL — AB (ref 3.5–5.0)
ALT: 22 U/L (ref 0–55)
ANION GAP: 11 meq/L (ref 3–11)
AST: 19 U/L (ref 5–34)
Alkaline Phosphatase: 54 U/L (ref 40–150)
BUN: 17.4 mg/dL (ref 7.0–26.0)
CO2: 22 mEq/L (ref 22–29)
Calcium: 8.5 mg/dL (ref 8.4–10.4)
Chloride: 105 mEq/L (ref 98–109)
Creatinine: 1.1 mg/dL (ref 0.6–1.1)
GLUCOSE: 222 mg/dL — AB (ref 70–140)
POTASSIUM: 4.4 meq/L (ref 3.5–5.1)
Sodium: 139 mEq/L (ref 136–145)
Total Bilirubin: 0.69 mg/dL (ref 0.20–1.20)
Total Protein: 7.4 g/dL (ref 6.4–8.3)

## 2014-10-20 LAB — CBC WITH DIFFERENTIAL/PLATELET
BASO%: 0.6 % (ref 0.0–2.0)
Basophils Absolute: 0 10*3/uL (ref 0.0–0.1)
EOS ABS: 0 10*3/uL (ref 0.0–0.5)
EOS%: 0.4 % (ref 0.0–7.0)
HCT: 36.9 % (ref 34.8–46.6)
HEMOGLOBIN: 11.7 g/dL (ref 11.6–15.9)
LYMPH#: 0.4 10*3/uL — AB (ref 0.9–3.3)
LYMPH%: 7.1 % — ABNORMAL LOW (ref 14.0–49.7)
MCH: 27.9 pg (ref 25.1–34.0)
MCHC: 31.6 g/dL (ref 31.5–36.0)
MCV: 88.2 fL (ref 79.5–101.0)
MONO#: 0.2 10*3/uL (ref 0.1–0.9)
MONO%: 2.8 % (ref 0.0–14.0)
NEUT%: 89.1 % — ABNORMAL HIGH (ref 38.4–76.8)
NEUTROS ABS: 5.2 10*3/uL (ref 1.5–6.5)
Platelets: 180 10*3/uL (ref 145–400)
RBC: 4.18 10*6/uL (ref 3.70–5.45)
RDW: 19 % — AB (ref 11.2–14.5)
WBC: 5.8 10*3/uL (ref 3.9–10.3)

## 2014-10-20 LAB — PROTIME-INR
INR: 1.5 — AB (ref 2.00–3.50)
Protime: 18 Seconds — ABNORMAL HIGH (ref 10.6–13.4)

## 2014-10-20 LAB — IGG, IGA, IGM
IGG (IMMUNOGLOBIN G), SERUM: 209 mg/dL — AB (ref 690–1700)
IGM, SERUM: 5 mg/dL — AB (ref 52–322)
IgA: 1790 mg/dL — ABNORMAL HIGH (ref 69–380)

## 2014-10-20 LAB — PROTEIN ELECTROPHORESIS, SERUM
Albumin ELP: 46 % — ABNORMAL LOW (ref 55.8–66.1)
Alpha-1-Globulin: 4.5 % (ref 2.9–4.9)
Alpha-2-Globulin: 8 % (ref 7.1–11.8)
BETA 2: 33 % — AB (ref 3.2–6.5)
Beta Globulin: 6.1 % (ref 4.7–7.2)
GAMMA GLOBULIN: 2.4 % — AB (ref 11.1–18.8)
M-SPIKE, %: 0.36 g/dL
Total Protein, Serum Electrophoresis: 6.4 g/dL (ref 6.0–8.3)

## 2014-10-20 LAB — KAPPA/LAMBDA LIGHT CHAINS
KAPPA LAMBDA RATIO: 29.81 — AB (ref 0.26–1.65)
Kappa free light chain: 15.8 mg/dL — ABNORMAL HIGH (ref 0.33–1.94)
LAMBDA FREE LGHT CHN: 0.53 mg/dL — AB (ref 0.57–2.63)

## 2014-10-20 LAB — POCT INR: INR: 1.5

## 2014-10-20 MED ORDER — ONDANSETRON HCL 8 MG PO TABS
ORAL_TABLET | ORAL | Status: AC
Start: 2014-10-20 — End: 2014-10-20
  Filled 2014-10-20: qty 1

## 2014-10-20 MED ORDER — BORTEZOMIB CHEMO SQ INJECTION 3.5 MG (2.5MG/ML)
1.3000 mg/m2 | Freq: Once | INTRAMUSCULAR | Status: AC
  Administered 2014-10-20: 2.5 mg via SUBCUTANEOUS
  Filled 2014-10-20: qty 2.5

## 2014-10-20 MED ORDER — ONDANSETRON HCL 8 MG PO TABS
8.0000 mg | ORAL_TABLET | Freq: Once | ORAL | Status: AC
  Administered 2014-10-20: 8 mg via ORAL

## 2014-10-20 NOTE — Patient Instructions (Signed)
[facility] Discharge Instructions for Patients Receiving Chemotherapy  Today you received the following chemotherapy agents velcade   To help prevent nausea and vomiting after your treatment, we encourage you to take your nausea medication as directed  If you develop nausea and vomiting that is not controlled by your nausea medication, call the clinic.   BELOW ARE SYMPTOMS THAT SHOULD BE REPORTED IMMEDIATELY:  *FEVER GREATER THAN 100.5 F  *CHILLS WITH OR WITHOUT FEVER  NAUSEA AND VOMITING THAT IS NOT CONTROLLED WITH YOUR NAUSEA MEDICATION  *UNUSUAL SHORTNESS OF BREATH  *UNUSUAL BRUISING OR BLEEDING  TENDERNESS IN MOUTH AND THROAT WITH OR WITHOUT PRESENCE OF ULCERS  *URINARY PROBLEMS  *BOWEL PROBLEMS  UNUSUAL RASH Items with * indicate a potential emergency and should be followed up as soon as possible.  Feel free to call the clinic you have any questions or concerns. The clinic phone number is [phone].  

## 2014-10-20 NOTE — Telephone Encounter (Signed)
gv pt appt schedule for nov/dec.

## 2014-10-20 NOTE — Progress Notes (Signed)
[place]  [phone] [phone]     ID: [name] OB: [birth year]  MR#: [id number]  UVO#:[id number]  PCP: Jenny Reichmann, MD GYN:   SU:  OTHER MD: Jodi Marble, MD;  Darlin Coco, MD  CHIEF COMPLAINT: Multiple myeloma, under active treatment CURRENT TREATMENT: dexamethasone, bortezomib, lenalidomide (on hold), zometa  MYELOMA HISTORY: From the original consult note, dated 03/24/2014:  "The patient has a history of PE following arthroscopic surgery,as well as LLE DVT requiring Coumadin therapy since October of 2014. On 4/7 she presented to the ED with a 2 day history of hemoptysis and mucous material. INR was elevated at 6 requiring FFP and Vit K with improvement of coagulopathy (INR 1.4 this morning). Anticoagulation is on hold until hemoptysis resolves.CT angiogram on 4/7 was negative for pulmonary embolus. Bilateral pneumonia was suspected, requiring IV antibiotics.  Interestingly, diffusely heterogeneous appearance to the visualized bones were seen, more notable than on a prior CT in 2010. Bone scan on 4/8 showed old fractures in posterior 8-9 th ribs, and in a lumbar XR a new lumbar spinal fracture was noted, not picked up by the bone scan. Given the mottled appearance of the CT films,workup for multiple myeloma was initiated: She had anemia in the setting of chronic disease, iron deficiency and blood loss, and mild renal insufficiency was seen with a Cr 1.62. Sodium was normal. Her Calcium however was 12.3 on admission, today at greater than 15 receiving Calcitonin 100U nasal spray, Zometa 4 mg and hydration IV. SPEP / UPEP / IFE are pending. "  Her subsequent history is as detailed below.  INTERVAL HISTORY: [name] returns today for follow up of her multiple myeloma, accompanied by her husband. Today is day 8, cycle 9 of her subcutaneous bortezomib which she receives on days 1, 4, 8, and 11 of each  21 day cycle. She also takes oral  dexamethasone, 20 mg every Tuesday and Wednesday.  She has been on zoledronic acid, most recent dose 09/25/2014. Her lenalidomide was mid-September but was resumed early October 2015--today is day 26 of her 28 day cycle, with lenalidomide given days 1 through 21.  REVIEW OF SYSTEMS: [name] tells me she is feeling better, doing "good".. She is folding the laundry, sweeping outside (believes keep coming down and she keeps sweeping them off). She occasionally has nausea, but no vomiting. Her appetite is down. She tells me her taste is better. She had an episode of diarrhea the other day, which resolved without intervention. She told me one day her sugar went down as low as 45. She felt sweaty and 8 something in that took care of that problem. She has a runny nose, no significant cough and no sore throat. Otherwise a detailed review of systems today was stable  PAST MEDICAL HISTORY: Past Medical History  Diagnosis Date  . Neurofibromatosis   . HTN (hypertension)   . Obesity   . Hyperlipidemia     statin intolerant. LDL is 83  . UTI (lower urinary tract infection) 05/29/12  . Pulmonary embolism 09/17/2013  . STEMI (ST elevation myocardial infarction) 05/29/2012    Inferolateral STEMI ; s/p DES to RCA, nl EF  . Multiple myeloma     Dr. Jana Hakim   . DVT (deep venous thrombosis) 08/2014    RLE  . Type II diabetes mellitus   . History of blood transfusion ?2015    "blood count dropped real low"  . Osteoarthritis, knee   . Arthritis     "  shoulders hurt" (09/30/2014)    PAST SURGICAL HISTORY: Past Surgical History  Procedure Laterality Date  . Clavicle surgery Left ~ 1960  . Ankle fracture surgery Right ?1990's  . Total knee arthroplasty Left 09/15/2013    Procedure: TOTAL KNEE ARTHROPLASTY;  Surgeon: Vickey Huger, MD;  Location: [facility];  Service: Orthopedics;  Laterality: Left;  . Hernia repair    . Coronary angioplasty with stent placement  05/2012    "1"  . Tonsillectomy and adenoidectomy   1950's  . Fracture surgery    . Bone marrow biopsy  03/2014    Dr. Jana Hakim   . Vaginal hysterectomy  1990's    FAMILY HISTORY Family History  Problem Relation Age of Onset  . Heart disease Mother   . Arthritis Mother   . Stroke Mother   . Heart disease Son     SOCIAL HISTORY:    (reviewed 05/19/2014)  Married. Lives in [place] with husband of 31 years. 2 sons in good health.      ADVANCED DIRECTIVES:    HEALTH MAINTENANCE: (updated 05/19/2014)  History  Substance Use Topics  . Smoking status: Never Smoker   . Smokeless tobacco: Never Used  . Alcohol Use: No     Colonoscopy:  not on file   PAP: not on file   Bone density: not on file   Lipid panel: December 2014   Allergies  Allergen Reactions  . Codeine Nausea And Vomiting    Sees things  . Hydrocodone Nausea And Vomiting    Sees things  . Niaspan [Niacin Er] Nausea Only  . Robaxin [Methocarbamol] Other (See Comments)    Made patient feel funny  . Statins Nausea And Vomiting and Other (See Comments)    Myalgias/leg pain with atorvastatin, rosuvastatin, lovastatin, simvastatin  . Tetracycline Other (See Comments)    Pt doesn't remember reaction  . Zetia [Ezetimibe] Nausea And Vomiting  . Zithromax [Azithromycin] Nausea And Vomiting    Current Outpatient Prescriptions  Medication Sig Dispense Refill  . acetaminophen (TYLENOL) 325 MG tablet Take 1-2 tablets (325-650 mg total) by mouth every 4 (four) hours as needed for mild pain.    . cetirizine (ZYRTEC) 10 MG tablet Take 10 mg by mouth daily.    Marland Kitchen dexamethasone (DECADRON) 4 MG tablet Take 20 mg by mouth as directed. 5 pills on Tuesday, Wednesday only    . glimepiride (AMARYL) 2 MG tablet Take 2 tablets by mouth every morning with breakfast.  On the days that you take prednisone or have chemotherapy take another dose at the evening meal 90 tablet 1  . glucose blood (ONE TOUCH ULTRA TEST) test strip 1 each by Other route 3 (three) times daily. 100 each 12   . Lancets MISC Check glucose 3 times daily 100 each 6  . lenalidomide (REVLIMID) 10 MG capsule Take 1 capsule (10 mg total) by mouth daily. 21 capsule 4  . metoprolol tartrate (LOPRESSOR) 25 MG tablet Take 25 mg by mouth 2 (two) times daily.    . metoprolol tartrate (LOPRESSOR) 25 MG tablet TAKE 1 TABLET BY MOUTH TWICE DAILY 60 tablet 5  . Multiple Vitamin (MULTIVITAMIN WITH MINERALS) TABS tablet Take 1 tablet by mouth at bedtime. Centrum Silver    . Multiple Vitamins-Minerals (OCUVITE PO) Take 1 tablet by mouth daily.     Marland Kitchen NITROSTAT 0.4 MG SL tablet DISSOLVE 1 TABLET UNDER THE TONGUE EVERY 5 MINUTES AS NEEDED FOR CHEST PAIN. UP TO 3 DOSES 25 tablet 0  .  nystatin (MYCOSTATIN/NYSTOP) 100000 UNIT/GM POWD Apply 1 g topically 3 (three) times daily as needed. 30 g 1  . polyethylene glycol (MIRALAX / GLYCOLAX) packet Take 17 g by mouth daily.    . prochlorperazine (COMPAZINE) 10 MG tablet Take 1 tablet (10 mg total) by mouth every 6 (six) hours as needed (Nausea or vomiting). 90 tablet 4  . traMADol (ULTRAM) 50 MG tablet Take 1 tablet (50 mg total) by mouth every 6 (six) hours as needed for moderate pain. 30 tablet 2  . valACYclovir (VALTREX) 1000 MG tablet Take 1 tablet (1,000 mg total) by mouth 2 (two) times daily. 30 tablet 4  . warfarin (COUMADIN) 5 MG tablet Take 1 tablet (5 mg total) by mouth daily. 1 tablet 0   No current facility-administered medications for this visit.    OBJECTIVE: Middle-aged white woman in no acute distress Filed Vitals:   10/20/14 1233  BP: 127/68  Pulse: 83  Temp: 98.4 F (36.9 C)  Resp: 18     Body mass index is 30.35 kg/(m^2).    ECOG FS:2 - Symptomatic, <50% confined to bed Filed Weights   10/20/14 1233  Weight: 171 lb 4.8 oz (77.701 kg)   Sclerae unicteric, pupils round and equal Oropharynx clear - no thrushnoted No cervical or supraclavicular adenopathy Lungs no rales or rhonchi Heart regular rate and rhythm Abd soft, obese, nontender, positive  bowel sounds MSK no focal spinal tenderness, no upper extremity lymphedema Neuro: nonfocal, well oriented, appropriateaffect   LAB RESULTS:  Lab Results  Component Value Date   WBC 5.8 10/20/2014   NEUTROABS 5.2 10/20/2014   HGB 11.7 10/20/2014   HCT 36.9 10/20/2014   MCV 88.2 10/20/2014   PLT 180 10/20/2014      Chemistry      Component Value Date/Time   NA 139 10/20/2014 1155   NA 138 09/29/2014 0533   K 4.4 10/20/2014 1155   K 4.3 09/29/2014 0533   CL 99 09/29/2014 0533   CO2 22 10/20/2014 1155   CO2 26 09/29/2014 0533   BUN 17.4 10/20/2014 1155   BUN 12 09/29/2014 0533   CREATININE 1.1 10/20/2014 1155   CREATININE 0.98 09/29/2014 0533   CREATININE 0.81 12/09/2013 1000   GLU 173 11/08/2010      Component Value Date/Time   CALCIUM 8.5 10/20/2014 1155   CALCIUM 8.0* 09/29/2014 0533   CALCIUM 11.1* 03/25/2014 0850   ALKPHOS 54 10/20/2014 1155   ALKPHOS 56 09/29/2014 0533   AST 19 10/20/2014 1155   AST 19 09/29/2014 0533   ALT 22 10/20/2014 1155   ALT 16 09/29/2014 0533   BILITOT 0.69 10/20/2014 1155   BILITOT 0.8 09/29/2014 0533      STUDIES: Dg Chest 2 View  10/07/2014   CLINICAL DATA:  Pneumonia, follow-up, shortness breath and cough  EXAM: CHEST  2 VIEW  COMPARISON:  CT angio chest of 09/29/2014 and chest x-ray of 09/28/2014  FINDINGS: The airspace disease described on CT is not definitely seen on the current chest x-ray and may have cleared. Mild linear scarring or atelectasis is noted at the left lung base. No pleural effusion is seen. The heart is within upper limits of normal. No acute bony abnormality is noted.  IMPRESSION: Appearing clearing of airspace disease noted by prior CT primarily within the upper lobes. Linear scarring or atelectasis is noted at the left lung base.   Electronically Signed   By: Ivar Drape M.D.   On: 10/07/2014 09:33  Dg Chest 2 View (if Patient Has Fever And/or Copd)  09/28/2014   CLINICAL DATA:  Hemoptysis. Anti  coagulation. Diabetes. Neurofibromatosis. Multiple myeloma.  EXAM: CHEST  2 VIEW  COMPARISON:  04/13/2014  FINDINGS: Low lung volumes are present, causing crowding of the pulmonary vasculature. Patchy airspace opacities in the lingula with bandlike opacities in the lower lobes. Indistinct airspace opacity in the upper lobes are mildly increased from 04/13/2014.  There are 2 adjacent 50-60% compression fractures at the thoracolumbar junction which appear to be new compared to 04/13/14.  IMPRESSION: 1. Two adjacent new compression fractures at the thoracolumbar junction. 2. Indistinct airspace opacities in the upper lobes, lingula, and left lower lobe, mildly increased from prior, possibly from pulmonary hemorrhage or multi lobar pneumonia.   Electronically Signed   By: Sherryl Barters M.D.   On: 09/28/2014 19:26   Ct Angio Chest Pe W/cm &/or Wo Cm  09/29/2014   CLINICAL DATA:  Hemoptysis for 2 days. On Coumadin. Evaluate for pulmonary hemorrhage. Initial encounter.  EXAM: CT ANGIOGRAPHY CHEST WITH CONTRAST  TECHNIQUE: Multidetector CT imaging of the chest was performed using the standard protocol during bolus administration of intravenous contrast. Multiplanar CT image reconstructions and MIPs were obtained to evaluate the vascular anatomy.  CONTRAST:  168mL OMNIPAQUE IOHEXOL 350 MG/ML SOLN  COMPARISON:  03/24/2014  FINDINGS: THORACIC INLET/BODY WALL:  No acute abnormality.  MEDIASTINUM:  No cardiomegaly or pericardial effusion. RCA atherosclerosis or stenting. There is intermittent motion, especially at the bases, which decreases sensitivity for detecting pulmonary embolism at the affected levels. Overall, the examination is diagnostic. No evidence of pulmonary embolism. Negative aorta. Elevation of the right diaphragm suggesting eventration.  LUNG WINDOWS:  Fairly symmetric, central predominant and upper lobe predominant airspace disease. Cyst slightly more extensive and dense on the left. No interlobular  septal thickening, effusion, or pneumothorax.  UPPER ABDOMEN:  No acute findings.  OSSEOUS:  No acute fracture.  No suspicious lytic or blastic lesions.  Review of the MIP images confirms the above findings.  IMPRESSION: 1. Bilateral airspace disease which could reflect alveolar hemorrhage or multi focal pneumonia. 2. No evidence of pulmonary embolism.   Electronically Signed   By: Jorje Guild M.D.   On: 09/29/2014 02:09     ASSESSMENT: 68 [place] presenting with hypercalcemia, anemia and mottled bone lesions April 2015, M-spike 3.82 g, IFE showing IgA/kappa myeloma with bone marrow biopsy 03/30/2014 with 88% plasma cells, FISH  t(4,14), deletion 13 and deletion 11 (intermediate risk); initial beta-2 microglobulin 16.  (1) considered E1A11 study, but unable to tolerate anticoagulation  (2) associated issues:  (a) coagulopathy: history of post-op PE; history of bleeding with warfarin; epistaxis with  ASA 325 ms/ day; switched to 81 mg/ day with fair tolerance  (c) hypercalcemia: started zolendronate 03/26/2014, to be repeated monthly  (d) pain: compression fracture L1, rib fractures: on tylenol and tramadol  (e) immunocompromise: on acyclovir and Septra prophylaxis  (3) started dexamethasone 40 mg/ week (20 mg po on Tues and Weds April 7169); complicated by hyperglycemia with history of diabetes, typically followed by Dr. Everlene Farrier  (4) started bortezomib SQ 04/28/2014, to be given days 1,4, 8 and 11 of ech 21 day cycle  (5) starting lenalidomide first week in September, held after 1 cycle with poor tolerance; resumed 09/30/2014 at 10 mg daily, days 1 through 21 of each 28 day cycle  PLAN: [name] is responding to treatment. The myeloma appears to grow when we only do Velcade and Decadron,  but as soon as we add the lenalidomide it improves. At some point we will try lenalidomide alone and that might be the way to go with her, given all the issues involved.  In the meantime we are  continuing as before. She is also tolerating the Coumadin currently with no bleeding problems. She will receive her next zolendronate in January.  I am surprised that her hemoglobin A1c is as good as it is given the fact that she has been on steroids. I commended her and Dr. Everlene Farrier for that. I don't think the diarrhea she experienced last week is really related to the treatment she is receiving here.  We are going to continue to follow her labwork on a monthly basis and she will see me again before Thanksgiving's. She knows to call for any problems that may develop before that visit.    Chauncey Cruel, MD   10/20/2014 12:52 PM

## 2014-10-20 NOTE — Progress Notes (Signed)
INR unchanged today at 1.5.  No changes in meds since last week.  No bleeding or bruising since we saw Ms Pentland last week.  Will increase coumadin to 5mg  today and Thurs and will take 2.5mg  tomorrow (5mg  T/Th/Sa and 2.5mg  other days.  Ms Orris had been taking, at least, coumadin 35mg  weekly prior to recent admission to hospital for PNA.  Will check PT/INR on Friday.  Ms Ratcliffe has an appt with [facility] next Wed if INR needed to be checked next week also.  Ms Butsch is receiving Velcade today.

## 2014-10-21 ENCOUNTER — Other Ambulatory Visit: Payer: Self-pay | Admitting: *Deleted

## 2014-10-21 MED ORDER — LENALIDOMIDE 10 MG PO CAPS: 10.0000 mg | ORAL_CAPSULE | Freq: Every day | ORAL | Status: DC

## 2014-10-22 ENCOUNTER — Other Ambulatory Visit: Payer: Self-pay | Admitting: *Deleted

## 2014-10-22 DIAGNOSIS — C9000 Multiple myeloma not having achieved remission: Secondary | ICD-10-CM

## 2014-10-23 ENCOUNTER — Other Ambulatory Visit (HOSPITAL_BASED_OUTPATIENT_CLINIC_OR_DEPARTMENT_OTHER): Payer: Medicare Other

## 2014-10-23 ENCOUNTER — Ambulatory Visit (INDEPENDENT_AMBULATORY_CARE_PROVIDER_SITE_OTHER): Payer: Self-pay | Admitting: Pharmacist

## 2014-10-23 ENCOUNTER — Ambulatory Visit (HOSPITAL_BASED_OUTPATIENT_CLINIC_OR_DEPARTMENT_OTHER): Payer: Medicare Other

## 2014-10-23 ENCOUNTER — Other Ambulatory Visit: Payer: Self-pay | Admitting: *Deleted

## 2014-10-23 DIAGNOSIS — Z86711 Personal history of pulmonary embolism: Secondary | ICD-10-CM

## 2014-10-23 DIAGNOSIS — I82401 Acute embolism and thrombosis of unspecified deep veins of right lower extremity: Secondary | ICD-10-CM

## 2014-10-23 DIAGNOSIS — C9000 Multiple myeloma not having achieved remission: Secondary | ICD-10-CM

## 2014-10-23 DIAGNOSIS — Z5112 Encounter for antineoplastic immunotherapy: Secondary | ICD-10-CM

## 2014-10-23 DIAGNOSIS — Z23 Encounter for immunization: Secondary | ICD-10-CM

## 2014-10-23 LAB — CBC WITH DIFFERENTIAL/PLATELET
BASO%: 0.5 % (ref 0.0–2.0)
BASOS ABS: 0 10*3/uL (ref 0.0–0.1)
EOS%: 0.5 % (ref 0.0–7.0)
Eosinophils Absolute: 0 10*3/uL (ref 0.0–0.5)
HEMATOCRIT: 33.3 % — AB (ref 34.8–46.6)
HEMOGLOBIN: 10.9 g/dL — AB (ref 11.6–15.9)
LYMPH%: 15.7 % (ref 14.0–49.7)
MCH: 28.7 pg (ref 25.1–34.0)
MCHC: 32.7 g/dL (ref 31.5–36.0)
MCV: 87.6 fL (ref 79.5–101.0)
MONO#: 2 10*3/uL — ABNORMAL HIGH (ref 0.1–0.9)
MONO%: 33.1 % — AB (ref 0.0–14.0)
NEUT#: 3 10*3/uL (ref 1.5–6.5)
NEUT%: 50.2 % (ref 38.4–76.8)
Platelets: 167 10*3/uL (ref 145–400)
RBC: 3.8 10*6/uL (ref 3.70–5.45)
RDW: 17.9 % — ABNORMAL HIGH (ref 11.2–14.5)
WBC: 6.1 10*3/uL (ref 3.9–10.3)
lymph#: 1 10*3/uL (ref 0.9–3.3)

## 2014-10-23 LAB — PROTIME-INR
INR: 2.5 (ref 2.00–3.50)
Protime: 30 Seconds — ABNORMAL HIGH (ref 10.6–13.4)

## 2014-10-23 LAB — POCT INR: INR: 2.5

## 2014-10-23 MED ORDER — INFLUENZA VAC SPLIT QUAD 0.5 ML IM SUSY
0.5000 mL | PREFILLED_SYRINGE | Freq: Once | INTRAMUSCULAR | Status: AC
  Administered 2014-10-23: 0.5 mL via INTRAMUSCULAR
  Filled 2014-10-23: qty 0.5

## 2014-10-23 MED ORDER — ONDANSETRON HCL 8 MG PO TABS
ORAL_TABLET | ORAL | Status: AC
  Filled 2014-10-23: qty 1

## 2014-10-23 MED ORDER — ONDANSETRON HCL 8 MG PO TABS
8.0000 mg | ORAL_TABLET | Freq: Once | ORAL | Status: AC
  Administered 2014-10-23: 8 mg via ORAL

## 2014-10-23 MED ORDER — BORTEZOMIB CHEMO SQ INJECTION 3.5 MG (2.5MG/ML)
1.3000 mg/m2 | Freq: Once | INTRAMUSCULAR | Status: AC
  Administered 2014-10-23: 2.5 mg via SUBCUTANEOUS
  Filled 2014-10-23: qty 2.5

## 2014-10-23 NOTE — Progress Notes (Signed)
INR at goal Pt seen in infusion area today Pt is doing well with no complaints INR now back to goal Pt still with bruising on her forearms from her hospital admission No other unusual bleeding or bruising noted No missed or extra doses Coumadin taken as instructed No diet or medication changes Plan:  Slight adjustment to coumadin 5mg  Tuesday, Thursday and 2.5mg  other days.  Per patient: INR to be drawn at Dr. Chase Caller on 11/11 we will call with results.  Then Recheck INR with scheduled appts on 11/03/14.  Will see you in infusion.

## 2014-10-23 NOTE — Patient Instructions (Signed)
[facility] Discharge Instructions for Patients Receiving Chemotherapy  Today you received the following chemotherapy agents: Velcade, Flu Shot  To help prevent nausea and vomiting after your treatment, we encourage you to take your nausea medication as prescribed by your physician.   If you develop nausea and vomiting that is not controlled by your nausea medication, call the clinic.   BELOW ARE SYMPTOMS THAT SHOULD BE REPORTED IMMEDIATELY:  *FEVER GREATER THAN 100.5 F  *CHILLS WITH OR WITHOUT FEVER  NAUSEA AND VOMITING THAT IS NOT CONTROLLED WITH YOUR NAUSEA MEDICATION  *UNUSUAL SHORTNESS OF BREATH  *UNUSUAL BRUISING OR BLEEDING  TENDERNESS IN MOUTH AND THROAT WITH OR WITHOUT PRESENCE OF ULCERS  *URINARY PROBLEMS  *BOWEL PROBLEMS  UNUSUAL RASH Items with * indicate a potential emergency and should be followed up as soon as possible.  Feel free to call the clinic you have any questions or concerns. The clinic phone number is [phone].

## 2014-10-23 NOTE — Telephone Encounter (Signed)
THIS REFILL REQUEST FOR REVLIMID WAS GIVEN TO DR.MAGRINAT'S NURSE, MEREDITH WALTON,RN. 

## 2014-10-23 NOTE — Patient Instructions (Signed)
INR at goal Slight adjustment to coumadin 5mg  Tuesday, Thursday and 2.5mg  other days.   INR at Dr. Chase Caller on 11/11 we will call with results.  Then Recheck INR with scheduled appts on 11/03/14.  Will see you in infusion.

## 2014-10-26 ENCOUNTER — Telehealth: Payer: Self-pay | Admitting: Oncology

## 2014-10-26 NOTE — Telephone Encounter (Signed)
per Chriss CC to sch CC-pt aware

## 2014-10-27 ENCOUNTER — Telehealth: Payer: Self-pay | Admitting: *Deleted

## 2014-10-27 ENCOUNTER — Other Ambulatory Visit: Payer: Self-pay | Admitting: *Deleted

## 2014-10-27 ENCOUNTER — Ambulatory Visit: Payer: Medicare Other | Admitting: Internal Medicine

## 2014-10-27 ENCOUNTER — Encounter: Payer: Self-pay | Admitting: *Deleted

## 2014-10-27 NOTE — Telephone Encounter (Signed)
RECEIVED A FAX FROM [facility] CONCERNING A CONFIRMATION OF FACSIMILE RECEIPT FOR PT. REFERRAL. 

## 2014-10-28 ENCOUNTER — Ambulatory Visit: Payer: Medicare Other | Admitting: Internal Medicine

## 2014-10-29 ENCOUNTER — Encounter: Payer: Self-pay | Admitting: Adult Health

## 2014-10-29 ENCOUNTER — Ambulatory Visit (INDEPENDENT_AMBULATORY_CARE_PROVIDER_SITE_OTHER): Payer: Medicare Other | Admitting: Adult Health

## 2014-10-29 ENCOUNTER — Other Ambulatory Visit (INDEPENDENT_AMBULATORY_CARE_PROVIDER_SITE_OTHER): Payer: Medicare Other

## 2014-10-29 ENCOUNTER — Ambulatory Visit: Payer: Medicare Other | Admitting: Pharmacist

## 2014-10-29 VITALS — BP 122/84 | HR 75 | Temp 97.0°F | Ht 63.0 in | Wt 175.0 lb

## 2014-10-29 DIAGNOSIS — R042 Hemoptysis: Secondary | ICD-10-CM

## 2014-10-29 DIAGNOSIS — I82401 Acute embolism and thrombosis of unspecified deep veins of right lower extremity: Secondary | ICD-10-CM

## 2014-10-29 DIAGNOSIS — R0489 Hemorrhage from other sites in respiratory passages: Secondary | ICD-10-CM

## 2014-10-29 DIAGNOSIS — I259 Chronic ischemic heart disease, unspecified: Secondary | ICD-10-CM

## 2014-10-29 LAB — PROTIME-INR
INR: 2.5 ratio — ABNORMAL HIGH (ref 0.8–1.0)
Prothrombin Time: 27.2 s — ABNORMAL HIGH (ref 9.6–13.1)

## 2014-10-29 LAB — POCT INR: INR: 2.5

## 2014-10-29 NOTE — Patient Instructions (Signed)
Continue follow up with coumadin clinic as planned  Call if develop any bleeding immediately.  Follow up Dr. Chase Caller in 2 months  As needed

## 2014-10-29 NOTE — Assessment & Plan Note (Signed)
Continue on Coumadin with goal near 2 range. Due to recent hemoptysis INR today, will fax to the Coumadin clinic at oncology Patient is report any episodes of bleeding immediately

## 2014-10-29 NOTE — Progress Notes (Signed)
INR at goal *Telephone Encounter - no charge* Pt is doing well with no complaints Spoke to patient over the phone INR from Dr. Chase Caller office visit today No missed or extra doses No diet or medication changes No unusual bleeding or bruising Plan: No changes.  Continue coumadin 5mg  Tuesday, Thursday and 2.5mg  other days. Recheck INR with scheduled appts on 11/03/14.  Will see her in infusion.

## 2014-10-29 NOTE — Telephone Encounter (Signed)
RECEIVED A FAX FROM [facility] CONCERNING A CONFIRMATION OF PRESCRIPTION SHIPMENT FOR REVLIMID ON 10/28/14.

## 2014-10-29 NOTE — Patient Instructions (Signed)
INR at goal No changes.  Continue coumadin 5mg  Tuesday, Thursday and 2.5mg  other days.  Recheck INR with scheduled appts on 11/03/14.  Will see you in infusion.

## 2014-10-29 NOTE — Addendum Note (Signed)
Addended by: Virl Cagey on: 10/29/2014 12:07 PM   Modules accepted: Orders

## 2014-10-29 NOTE — Assessment & Plan Note (Signed)
Pulmonary hemorrhage while on Coumadin therapy>Resolved Chest x-ray last office visit showed clearance of airspace disease Keep INR range around 2  follow back up with Dr. Chase Caller in 2 months and As needed

## 2014-10-29 NOTE — Progress Notes (Signed)
Subjective:    Patient ID: [name], female    DOB: 05-19-1946, 68 y.o.   MRN: [id number]  HPI  68 year old with a cardiac history, HTN and diabetes presenting to the hospital for left knee replacement. On 10/1 patient was found to be tachycardic and hypoxic. CTA was performed that showed a pulmonary embolism and PCCM was called on consultation. Patient denies previous blood clots or family history of blood clots. Has not been on blood thinners in the past. No hemoptysis or chest pain.  SIGNIFICANT EVENTS / STUDIES:  10/1 >>> CTA with PE.  10/10/13 - duplex LE - negative DVT  Oct 10, 2013 - ECHO - normal RV  Pulmonary arteries: Systolic pressure was mildly to moderately increased. PA peak pressure: 2mm Hg (S    ASSESSMENT / PLAN:  68 year old female with no previous blood clot history, currently on lovenox DVT prophylaxis who developed a provoked PE. Patient was on ticagrelor prior to surgery for stent placement. Communicated with ortho, ok to start anti-coagulant. This is a first PE that is provoked by surgery.   09/22/13: improved and stable to go home. Meets criteria 5d IV heparin + INR > 2 x 24h.No bleed. Looks well. Will be off brilinta. However, pulse ox 88% at Rest on ROom Air. So need 2L oxygen  Plan:  -DC heparin infusion; home 2 after dc (dc;ed at 13.50 approximately)  -Coumadin clinic; will have my office arrage  - -After provoked (post-ortho procedure) VTE, usual recommended duration is 3-6 months with 6 mos preferred if no problems with anticoagulation  -There is no reason to suspect a hypercoag state but apparently a panel has been sent already so will follow up on this  - START 2L o2 will monitor this at followup; but medicare refused   OV 10/01/13  Followup hospital followup. Recent pulmonary embolism provoked after orthopedic surgery despite Lovenox treatment. Other risk factors obesity  -Since discharge she is slowly gaining her physical conditioning. Chest  physical therapy working with her. She still feels fatigued. She still has dyspnea although this is improved. There no new complaints. Her INR is being checked. Today it was 5.7 and Coumadin is on hold. There no new complaints husband is with heer. She plans to start outpatient physical therapy next week.  - Walk test today on room air with a walker 185 x feet x 1 laps on RA: stopped  due to leg fatigue. NO desat below 93%  11/11/13 Follow up  6 week follow up PE - reports dyspnea is about the same since last ov.  Doing well. No hemoptysis, chest pain, orthopnea, or edema.  Going to the [facility] every 2 weeks. INR this am was 1.7. Coumadin was adjusted to reach goal 2-3 . Says no missed doses of coumadin.  DOE is improved .    OV 12/23/2013 FU PE  INR now consistently at goal > 2 and on stable regimen. Early Dec 2014 had LEf sidedSVT  Diagnosed by PMD with duplex. Saw Dr Julien Nordmann for 2nd opinion; recommended 1 year anticoagualation at minimum. Currently astymptomatic. Feels well. Has INR monitoring at [facility] through [name]. Wants to know how long anti coagulation and how to monitor for resolutioin  10/07/14  [facility] follow up (DVT / PE  and multiple myeloma )  Admitted 10/12-10/15 for Hemoptysis , recently dx  DVT on coumadin w/ therapeutic INR   . CT showed bilateral aspdz concerning for alveolar hemorrhage vs PNA . INR was  2.69 on admit .  Pulmonary recommended keeping INR closer to 2.0 range than 3.0 .  She was treated with abx. And discharged on levaquin.  She returns today imrpoved ,no further hemoptysis  Has follow up with oncology for myeloma tx.  Last INR on 10l19 was 3.9 , she is off coumadin . No known bleeding (hbg was stable )  Advised on need to get INR lower to avoid bleeding  CXR today is much improved with resolved aspdz.  Feels her cough is much better.  Has INR check for tomorrow.  Denies fever, chest pain, orthopnea , edema or n/v/d .  >>  10/29/2014 Follow up   Admitted 10/12-10/15 for Hemoptysis , recently dx  DVT on coumadin w/ therapeutic INR   . CT showed bilateral aspdz concerning for alveolar hemorrhage vs PNA . INR was 2.69 on admit .  Returns for follow up .  Last INR was 2.50 10/23/14  No hemoptysis, chst pain, orthopena or edema.  cxr last ov showed clearance of aspdz .  Has Multiple Myeloma under active treatment (dexamethasone, bortezomib, lenalidomide (on hold), zometa)  Tolerating okay except for fatigue.    Review of Systems  Constitutional:   No  weight loss, night sweats,  Fevers, chills, ++ fatigue, or  lassitude.  HEENT:   No headaches,  Difficulty swallowing,  Tooth/dental problems, or  Sore throat,                No sneezing, itching, ear ache, nasal congestion, post nasal drip,   CV:  No chest pain,  Orthopnea, PND, swelling in lower extremities, anasarca, dizziness, palpitations, syncope.   GI  No heartburn, indigestion, abdominal pain, nausea, vomiting, diarrhea, change in bowel habits, loss of appetite, bloody stools.   Resp:    No chest wall deformity  Skin: no rash or lesions.  GU: no dysuria, change in color of urine, no urgency or frequency.  No flank pain, no hematuria   MS:  No joint pain or swelling.  No decreased range of motion.  No back pain.  Psych:  No change in mood or affect. No depression or anxiety.  No memory loss.          Objective:   Physical Exam GEN: A/Ox3; pleasant , NAD, elderly   HEENT:  [place]/AT,  EACs-clear, TMs-wnl, NOSE-clear, THROAT-clear, no lesions, no postnasal drip or exudate noted.   NECK:  Supple w/ fair ROM; no JVD; normal carotid impulses w/o bruits; no thyromegaly or nodules palpated; no lymphadenopathy.  RESP  Decreased BS in bases  .no accessory muscle use, no dullness to percussion  CARD:  RRR, no m/r/g  , no peripheral edema, pulses intact, no cyanosis or clubbing.  GI:   Soft & nt; nml bowel sounds; no organomegaly or masses detected.  Musco: Warm bil, no  deformities or joint swelling noted.   Neuro: alert, no focal deficits noted.    Skin: Warm, no lesions or rashes        Assessment & Plan:

## 2014-10-30 NOTE — Progress Notes (Signed)
Quick Note:  lmtcb X1 for pt. ______ 

## 2014-11-02 ENCOUNTER — Telehealth: Payer: Self-pay | Admitting: Oncology

## 2014-11-02 ENCOUNTER — Telehealth: Payer: Self-pay | Admitting: *Deleted

## 2014-11-02 ENCOUNTER — Other Ambulatory Visit: Payer: Self-pay | Admitting: *Deleted

## 2014-11-02 NOTE — Telephone Encounter (Signed)
s.w. pt and advised on 11.17 appt....Marland Kitchenok and aware

## 2014-11-02 NOTE — Telephone Encounter (Signed)
Received call from patient that she developed diarrhea Saturday night and she thinks it is coming from the [place]. Per Dr. Jana Hakim, patient will be seen by Susanne Borders, NP, on Tuesday. Patient stated,"I am drinking and eating. I can wait til Tuesday to be seen." Instructed patient to go to the ER if she felt worse. Patient verbalized understanding. POF sent to scheduling.

## 2014-11-03 ENCOUNTER — Encounter: Payer: Self-pay | Admitting: Nurse Practitioner

## 2014-11-03 ENCOUNTER — Other Ambulatory Visit: Payer: Self-pay | Admitting: Oncology

## 2014-11-03 ENCOUNTER — Other Ambulatory Visit: Payer: Self-pay | Admitting: *Deleted

## 2014-11-03 ENCOUNTER — Ambulatory Visit (HOSPITAL_BASED_OUTPATIENT_CLINIC_OR_DEPARTMENT_OTHER): Payer: Medicare Other

## 2014-11-03 ENCOUNTER — Ambulatory Visit (HOSPITAL_BASED_OUTPATIENT_CLINIC_OR_DEPARTMENT_OTHER): Payer: Self-pay | Admitting: Pharmacist

## 2014-11-03 ENCOUNTER — Other Ambulatory Visit: Payer: Medicare Other

## 2014-11-03 ENCOUNTER — Telehealth: Payer: Self-pay | Admitting: Oncology

## 2014-11-03 ENCOUNTER — Other Ambulatory Visit (HOSPITAL_BASED_OUTPATIENT_CLINIC_OR_DEPARTMENT_OTHER): Payer: Medicare Other

## 2014-11-03 ENCOUNTER — Ambulatory Visit (HOSPITAL_BASED_OUTPATIENT_CLINIC_OR_DEPARTMENT_OTHER): Payer: Medicare Other | Admitting: Nurse Practitioner

## 2014-11-03 VITALS — BP 124/61 | HR 88 | Temp 98.3°F | Resp 18 | Ht 63.0 in | Wt 174.0 lb

## 2014-11-03 DIAGNOSIS — C9000 Multiple myeloma not having achieved remission: Secondary | ICD-10-CM

## 2014-11-03 DIAGNOSIS — E860 Dehydration: Secondary | ICD-10-CM

## 2014-11-03 DIAGNOSIS — Z5112 Encounter for antineoplastic immunotherapy: Secondary | ICD-10-CM

## 2014-11-03 DIAGNOSIS — R197 Diarrhea, unspecified: Secondary | ICD-10-CM

## 2014-11-03 DIAGNOSIS — Z86711 Personal history of pulmonary embolism: Secondary | ICD-10-CM

## 2014-11-03 DIAGNOSIS — R21 Rash and other nonspecific skin eruption: Secondary | ICD-10-CM | POA: Insufficient documentation

## 2014-11-03 DIAGNOSIS — I82401 Acute embolism and thrombosis of unspecified deep veins of right lower extremity: Secondary | ICD-10-CM

## 2014-11-03 LAB — CBC WITH DIFFERENTIAL/PLATELET
BASO%: 0.6 % (ref 0.0–2.0)
Basophils Absolute: 0.1 10*3/uL (ref 0.0–0.1)
EOS%: 0.8 % (ref 0.0–7.0)
Eosinophils Absolute: 0.1 10*3/uL (ref 0.0–0.5)
HEMATOCRIT: 35.6 % (ref 34.8–46.6)
HGB: 11.3 g/dL — ABNORMAL LOW (ref 11.6–15.9)
LYMPH%: 3.9 % — ABNORMAL LOW (ref 14.0–49.7)
MCH: 27.6 pg (ref 25.1–34.0)
MCHC: 31.8 g/dL (ref 31.5–36.0)
MCV: 87 fL (ref 79.5–101.0)
MONO#: 0.3 10*3/uL (ref 0.1–0.9)
MONO%: 2.5 % (ref 0.0–14.0)
NEUT#: 10.7 10*3/uL — ABNORMAL HIGH (ref 1.5–6.5)
NEUT%: 92.2 % — AB (ref 38.4–76.8)
Platelets: 334 10*3/uL (ref 145–400)
RBC: 4.09 10*6/uL (ref 3.70–5.45)
RDW: 18 % — ABNORMAL HIGH (ref 11.2–14.5)
WBC: 11.7 10*3/uL — ABNORMAL HIGH (ref 3.9–10.3)
lymph#: 0.5 10*3/uL — ABNORMAL LOW (ref 0.9–3.3)

## 2014-11-03 LAB — COMPREHENSIVE METABOLIC PANEL
ALT: 24 U/L (ref 0–35)
AST: 22 U/L (ref 0–37)
Albumin: 3.1 g/dL — ABNORMAL LOW (ref 3.5–5.2)
Alkaline Phosphatase: 59 U/L (ref 39–117)
BUN: 20 mg/dL (ref 6–23)
CALCIUM: 9.2 mg/dL (ref 8.4–10.5)
CO2: 25 mEq/L (ref 19–32)
CREATININE: 0.89 mg/dL (ref 0.50–1.10)
Chloride: 100 mEq/L (ref 96–112)
Glucose, Bld: 190 mg/dL — ABNORMAL HIGH (ref 70–99)
Potassium: 4.7 mEq/L (ref 3.5–5.3)
Sodium: 140 mEq/L (ref 135–145)
Total Bilirubin: 0.6 mg/dL (ref 0.3–1.2)
Total Protein: 7.7 g/dL (ref 6.0–8.3)

## 2014-11-03 LAB — PROTIME-INR
INR: 2.4 (ref 2.00–3.50)
Protime: 28.8 Seconds — ABNORMAL HIGH (ref 10.6–13.4)

## 2014-11-03 LAB — POCT INR: INR: 2.4

## 2014-11-03 MED ORDER — SODIUM CHLORIDE 0.9 % IV SOLN
Freq: Once | INTRAVENOUS | Status: AC
  Administered 2014-11-03: 16:00:00 via INTRAVENOUS

## 2014-11-03 MED ORDER — CLINDAMYCIN PHOSPHATE 1 % EX GEL: Freq: Two times a day (BID) | CUTANEOUS | Status: DC

## 2014-11-03 MED ORDER — DEXAMETHASONE 4 MG PO TABS: 20.0000 mg | ORAL_TABLET | ORAL | Status: DC

## 2014-11-03 MED ORDER — ONDANSETRON HCL 8 MG PO TABS
8.0000 mg | ORAL_TABLET | Freq: Once | ORAL | Status: AC
  Administered 2014-11-03: 8 mg via ORAL

## 2014-11-03 MED ORDER — ONDANSETRON HCL 8 MG PO TABS
ORAL_TABLET | ORAL | Status: AC
  Filled 2014-11-03: qty 1

## 2014-11-03 MED ORDER — BORTEZOMIB CHEMO SQ INJECTION 3.5 MG (2.5MG/ML)
1.3000 mg/m2 | Freq: Once | INTRAMUSCULAR | Status: AC
  Administered 2014-11-03: 2.5 mg via SUBCUTANEOUS
  Filled 2014-11-03: qty 2.5

## 2014-11-03 NOTE — Telephone Encounter (Signed)
per Chris to sch CC-pt aware °

## 2014-11-03 NOTE — Patient Instructions (Signed)
[facility] Dis[facility] Instructions for Patients Receiving Chemotherapy  Today you received the following chemotherapy agents velcade  To help prevent nausea and vomiting after your treatment, we encourage you to take your nausea medication as directed   If you develop nausea and vomiting that is not controlled by your nausea medication, call the clinic.   BELOW ARE SYMPTOMS THAT SHOULD BE REPORTED IMMEDIATELY:  *FEVER GREATER THAN 100.5 F  *CHILLS WITH OR WITHOUT FEVER  NAUSEA AND VOMITING THAT IS NOT CONTROLLED WITH YOUR NAUSEA MEDICATION  *UNUSUAL SHORTNESS OF BREATH  *UNUSUAL BRUISING OR BLEEDING  TENDERNESS IN MOUTH AND THROAT WITH OR WITHOUT PRESENCE OF ULCERS  *URINARY PROBLEMS  *BOWEL PROBLEMS  UNUSUAL RASH Items with * indicate a potential emergency and should be followed up as soon as possible.  Feel free to call the clinic you have any questions or concerns. The clinic phone number is [phone]. Dehydration, Adult Dehydration means your body does not have as much fluid as it needs. Your kidneys, brain, and heart will not work properly without the right amount of fluids and salt.  HOME CARE  Ask your doctor how to replace body fluid losses (rehydrate).  Drink enough fluids to keep your pee (urine) clear or pale yellow.  Drink small amounts of fluids often if you feel sick to your stomach (nauseous) or throw up (vomit).  Eat like you normally do.  Avoid:  Foods or drinks high in sugar.  Bubbly (carbonated) drinks.  Juice.  Very hot or cold fluids.  Drinks with caffeine.  Fatty, greasy foods.  Alcohol.  Tobacco.  Eating too much.  Gelatin desserts.  Wash your hands to avoid spreading germs (bacteria, viruses).  Only take medicine as told by your doctor.  Keep all doctor visits as told. GET HELP RIGHT AWAY IF:   You cannot drink something without throwing up.  You get worse even with treatment.  Your vomit has blood  in it or looks greenish.  Your poop (stool) has blood in it or looks black and tarry.  You have not peed in 6 to 8 hours.  You pee a small amount of very dark pee.  You have a fever.  You pass out (faint).  You have belly (abdominal) pain that gets worse or stays in one spot (localizes).  You have a rash, stiff neck, or bad headache.  You get easily annoyed, sleepy, or are hard to wake up.  You feel weak, dizzy, or very thirsty. MAKE SURE YOU:   Understand these instructions.  Will watch your condition.  Will get help right away if you are not doing well or get worse. Document Released: 09/30/2009 Document Revised: 02/26/2012 Document Reviewed: 07/24/2011 [facility] Patient Information 2015 [place], [place]. This information is not intended to replace advice given to you by your health care provider. Make sure you discuss any questions you have with your health care provider.

## 2014-11-03 NOTE — Progress Notes (Signed)
[facility]  [phone] [phone]     ID: [name] OB: 24-May-1946  MR#: [id number]  ION#:[id number]  PCP: Jenny Reichmann, MD GYN:   SU:  OTHER MD: Jodi Marble, MD;  Darlin Coco, MD  CHIEF COMPLAINT: Multiple myeloma, under active treatment CURRENT TREATMENT: dexamethasone, bortezomib, lenalidomide (on hold), zometa  MYELOMA HISTORY: From the original consult note, dated 03/24/2014:  "The patient has a history of PE following arthroscopic surgery,as well as LLE DVT requiring Coumadin therapy since October of 2014. On 4/7 she presented to the ED with a 2 day history of hemoptysis and mucous material. INR was elevated at 6 requiring FFP and Vit K with improvement of coagulopathy (INR 1.4 this morning). Anticoagulation is on hold until hemoptysis resolves.CT angiogram on 4/7 was negative for pulmonary embolus. Bilateral pneumonia was suspected, requiring IV antibiotics.  Interestingly, diffusely heterogeneous appearance to the visualized bones were seen, more notable than on a prior CT in 2010. Bone scan on 4/8 showed old fractures in posterior 8-9 th ribs, and in a lumbar XR a new lumbar spinal fracture was noted, not picked up by the bone scan. Given the mottled appearance of the CT films,workup for multiple myeloma was initiated: She had anemia in the setting of chronic disease, iron deficiency and blood loss, and mild renal insufficiency was seen with a Cr 1.62. Sodium was normal. Her Calcium however was 12.3 on admission, today at greater than 15 receiving Calcitonin 100U nasal spray, Zometa 4 mg and hydration IV. SPEP / UPEP / IFE are pending. "  Her subsequent history is as detailed below.  INTERVAL HISTORY: [name] returns today for follow up of her multiple myeloma, accompanied by her husband. Today is day 1, cycle 10 of her subcutaneous bortezomib which she receives on days 1, 4, 8, and 11 of each 21 day cycle. She also takes oral  dexamethasone, 20 mg every Tuesday and Wednesday.  She has been on zoledronic acid, most recent dose 09/25/2014. Her lenalidomide was mid-September but was resumed early October 2015--today is day 12 of her 28 day cycle, with lenalidomide given days 1 through 21.  REVIEW OF SYSTEMS: [name]'s main complaint today is intolerance of the valacyclovir. She has a tremendous amount of diarrhea while on this drug, and fared no better while on acyclovir a few week ago. On Saturday she had 15 bowel movements, and she had 10 on Sunday. She finally stopped the drug on Monday and has only had 1 loose stool since then. She has had some mild nausea but this is managed well with her PRN anti-emetics. Her appetite and energy level are low, but she makes sure to attempt to eat what she can, and she drinks around 64oz water daily. Her blood sugars are well controlled. Her cough continues and has a small amount of brown tinged sputum associated with it, as well as shortness of breath with exertion. Her face has broken out into a rash that is mildly pruritic, but not painful. She takes ultram PRN for shoulder and back pain. A detailed review of systems is otherwise noncontributory.  PAST MEDICAL HISTORY: Past Medical History  Diagnosis Date  . Neurofibromatosis   . HTN (hypertension)   . Obesity   . Hyperlipidemia     statin intolerant. LDL is 83  . UTI (lower urinary tract infection) 05/29/12  . Pulmonary embolism 09/17/2013  . STEMI (ST elevation myocardial infarction) 05/29/2012    Inferolateral STEMI ; s/p DES to RCA,  nl EF  . Multiple myeloma     Dr. Jana Hakim   . DVT (deep venous thrombosis) 08/2014    RLE  . Type II diabetes mellitus   . History of blood transfusion ?2015    "blood count dropped real low"  . Osteoarthritis, knee   . Arthritis     "shoulders hurt" (09/30/2014)    PAST SURGICAL HISTORY: Past Surgical History  Procedure Laterality Date  . Clavicle surgery Left ~ 1960  . Ankle fracture  surgery Right ?1990's  . Total knee arthroplasty Left 09/15/2013    Procedure: TOTAL KNEE ARTHROPLASTY;  Surgeon: Vickey Huger, MD;  Location: [facility];  Service: Orthopedics;  Laterality: Left;  . Hernia repair    . Coronary angioplasty with stent placement  05/2012    "1"  . Tonsillectomy and adenoidectomy  1950's  . Fracture surgery    . Bone marrow biopsy  03/2014    Dr. Jana Hakim   . Vaginal hysterectomy  1990's    FAMILY HISTORY Family History  Problem Relation Age of Onset  . Heart disease Mother   . Arthritis Mother   . Stroke Mother   . Heart disease Son     SOCIAL HISTORY:    (reviewed 05/19/2014)  Married. Lives in [place] with husband of 52 years. 2 sons in good health.      ADVANCED DIRECTIVES:    HEALTH MAINTENANCE: (updated 05/19/2014)  History  Substance Use Topics  . Smoking status: Never Smoker   . Smokeless tobacco: Never Used  . Alcohol Use: No     Colonoscopy:  not on file   PAP: not on file   Bone density: not on file   Lipid panel: December 2014   Allergies  Allergen Reactions  . Codeine Nausea And Vomiting    Sees things  . Hydrocodone Nausea And Vomiting    Sees things  . Niaspan [Niacin Er] Nausea Only  . Robaxin [Methocarbamol] Other (See Comments)    Made patient feel funny  . Statins Nausea And Vomiting and Other (See Comments)    Myalgias/leg pain with atorvastatin, rosuvastatin, lovastatin, simvastatin  . Tetracycline Other (See Comments)    Pt doesn't remember reaction  . Zetia [Ezetimibe] Nausea And Vomiting  . Zithromax [Azithromycin] Nausea And Vomiting    Current Outpatient Prescriptions  Medication Sig Dispense Refill  . acetaminophen (TYLENOL) 325 MG tablet Take 1-2 tablets (325-650 mg total) by mouth every 4 (four) hours as needed for mild pain.    . cetirizine (ZYRTEC) 10 MG tablet Take 10 mg by mouth daily as needed.     Marland Kitchen glimepiride (AMARYL) 2 MG tablet Take 2 tablets by mouth every morning with breakfast.  On the  days that you take prednisone or have chemotherapy take another dose at the evening meal 90 tablet 1  . glucose blood (ONE TOUCH ULTRA TEST) test strip 1 each by Other route 3 (three) times daily. 100 each 12  . Lancets MISC Check glucose 3 times daily 100 each 6  . lenalidomide (REVLIMID) 10 MG capsule Take 1 capsule (10 mg total) by mouth daily. 21 capsule 0  . metoprolol tartrate (LOPRESSOR) 25 MG tablet Take 25 mg by mouth 2 (two) times daily.    . Multiple Vitamin (MULTIVITAMIN WITH MINERALS) TABS tablet Take 1 tablet by mouth at bedtime. Centrum Silver    . Multiple Vitamins-Minerals (OCUVITE PO) Take 1 tablet by mouth daily.     . traMADol (ULTRAM) 50 MG tablet Take 1  tablet (50 mg total) by mouth every 6 (six) hours as needed for moderate pain. 30 tablet 2  . warfarin (COUMADIN) 5 MG tablet Take 1 tablet (5 mg total) by mouth daily. (Patient taking differently: Take 5 mg by mouth daily. Pt takes 5 mg tablet on Tues and Thursday. Pt takes 1/2 tablet(2.5 mg) on Mon, Wed, Fri, Sat, Sun) 1 tablet 0  . clindamycin (CLINDAGEL) 1 % gel Apply topically 2 (two) times daily. 30 g 0  . dexamethasone (DECADRON) 4 MG tablet Take 5 tablets (20 mg total) by mouth as directed. 5 pills on Tuesday, Wednesday only 45 tablet 0  . NITROSTAT 0.4 MG SL tablet DISSOLVE 1 TABLET UNDER THE TONGUE EVERY 5 MINUTES AS NEEDED FOR CHEST PAIN. UP TO 3 DOSES 25 tablet 0  . nystatin (MYCOSTATIN/NYSTOP) 100000 UNIT/GM POWD Apply 1 g topically 3 (three) times daily as needed. 30 g 1  . polyethylene glycol (MIRALAX / GLYCOLAX) packet Take 17 g by mouth daily.    . prochlorperazine (COMPAZINE) 10 MG tablet Take 1 tablet (10 mg total) by mouth every 6 (six) hours as needed (Nausea or vomiting). 90 tablet 4  . valACYclovir (VALTREX) 1000 MG tablet Take 1 tablet (1,000 mg total) by mouth 2 (two) times daily. 30 tablet 4   No current facility-administered medications for this visit.   Facility-Administered Medications Ordered in  Other Visits  Medication Dose Route Frequency Provider Last Rate Last Dose  . 0.9 %  sodium chloride infusion   Intravenous Once Marcelino Duster, NP 500 mL/hr at 11/03/14 1533      OBJECTIVE: Middle-aged white woman in no acute distress Filed Vitals:   11/03/14 1327  BP: 124/61  Pulse: 88  Temp: 98.3 F (36.8 C)  Resp: 18     Body mass index is 30.83 kg/(m^2).    ECOG FS:2 - Symptomatic, <50% confined to bed Filed Weights   11/03/14 1327  Weight: 174 lb (78.926 kg)   Skin: warm, dry, erythematous papular lesions diffusely scattered on face, not present on any other surface of body HEENT: sclerae anicteric, conjunctivae pink, oropharynx clear. No thrush or mucositis.  Lymph Nodes: No cervical or supraclavicular lymphadenopathy  Lungs: clear to auscultation bilaterally, productive cough Heart: regular rate and rhythm  Abdomen: round, soft, non tender, positive bowel sounds  Musculoskeletal: No focal spinal tenderness, no peripheral edema  Neuro: non focal, well oriented, positive affect  Breasts: deferred   LAB RESULTS:  Lab Results  Component Value Date   WBC 11.7* 11/03/2014   NEUTROABS 10.7* 11/03/2014   HGB 11.3* 11/03/2014   HCT 35.6 11/03/2014   MCV 87.0 11/03/2014   PLT 334 11/03/2014      Chemistry      Component Value Date/Time   NA 140 11/03/2014 1326   NA 139 10/20/2014 1155   K 4.7 11/03/2014 1326   K 4.4 10/20/2014 1155   CL 100 11/03/2014 1326   CO2 25 11/03/2014 1326   CO2 22 10/20/2014 1155   BUN 20 11/03/2014 1326   BUN 17.4 10/20/2014 1155   CREATININE 0.89 11/03/2014 1326   CREATININE 1.1 10/20/2014 1155   CREATININE 0.81 12/09/2013 1000   GLU 173 11/08/2010      Component Value Date/Time   CALCIUM 9.2 11/03/2014 1326   CALCIUM 8.5 10/20/2014 1155   CALCIUM 11.1* 03/25/2014 0850   ALKPHOS 59 11/03/2014 1326   ALKPHOS 54 10/20/2014 1155   AST 22 11/03/2014 1326   AST 19 10/20/2014 1155  ALT 24 11/03/2014 1326   ALT 22 10/20/2014  1155   BILITOT 0.6 11/03/2014 1326   BILITOT 0.69 10/20/2014 1155      STUDIES: Dg Chest 2 View  10/07/2014   CLINICAL DATA:  Pneumonia, follow-up, shortness breath and cough  EXAM: CHEST  2 VIEW  COMPARISON:  CT angio chest of 09/29/2014 and chest x-ray of 09/28/2014  FINDINGS: The airspace disease described on CT is not definitely seen on the current chest x-ray and may have cleared. Mild linear scarring or atelectasis is noted at the left lung base. No pleural effusion is seen. The heart is within upper limits of normal. No acute bony abnormality is noted.  IMPRESSION: Appearing clearing of airspace disease noted by prior CT primarily within the upper lobes. Linear scarring or atelectasis is noted at the left lung base.   Electronically Signed   By: Ivar Drape M.D.   On: 10/07/2014 09:33     ASSESSMENT: 68 [place] presenting with hypercalcemia, anemia and mottled bone lesions April 2015, M-spike 3.82 g, IFE showing IgA/kappa myeloma with bone marrow biopsy 03/30/2014 with 88% plasma cells, FISH  t(4,14), deletion 13 and deletion 11 (intermediate risk); initial beta-2 microglobulin 16.  (1) considered E1A11 study, but unable to tolerate anticoagulation  (2) associated issues:  (a) coagulopathy: history of post-op PE; history of bleeding with warfarin; epistaxis with  ASA 325 ms/ day; switched to 81 mg/ day with fair tolerance  (c) hypercalcemia: started zolendronate 03/26/2014, to be repeated monthly  (d) pain: compression fracture L1, rib fractures: on tylenol and tramadol  (e) immunocompromise: on Septra prophylaxis, valacyclovir and acyclovir both stopped because of uncontrolled diarrhea  (3) started dexamethasone 40 mg/ week (20 mg po on Tues and Weds April 9675); complicated by hyperglycemia with history of diabetes, typically followed by Dr. Everlene Farrier  (4) started bortezomib SQ 04/28/2014, to be given days 1,4, 8 and 11 of ech 21 day cycle  (5) starting lenalidomide first  week in September, held after 1 cycle with poor tolerance; resumed 09/30/2014 at 10 mg daily, days 1 through 21 of each 28 day cycle  PLAN: [name] is feeling better now that she has stopped the valacyclovir and we will not restart this drug. I have warned the patient that while immunocompromised, she is at greater risk for a shingles outbreak, and to notify us immediately of a shingles-like rash. As far as the breakout on her face, she is already on steroids regularly. I consulted with Dr. Jana Hakim and he suggested a clindamycin ointment, so I have sent this prescription to her pharmacy. Her dexamethasone was refilled and she will continue this drug as advised. We will proceed with her next dose of velcade today and she will continue her course of lenalidomide as well. The labs were reviewed in detail and were stable.  [name] will receive IV fluids while back in the treatment room for dehydration secondary to her diarrhea.   [name] will return next week for labs and an office visit with Dr. Jana Hakim. She understands and agrees with this plan.  She has been encouraged to call with any issues that might arise before her next visit here.   Marcelino Duster, NP   11/03/2014 4:07 PM

## 2014-11-03 NOTE — Patient Instructions (Signed)
INR at goal No changes.  Continue coumadin 5mg  Tuesday, Thursday and 2.5mg  other days. Recheck INR with scheduled appts on 11/10/14.  Will see you in infusion.

## 2014-11-03 NOTE — Progress Notes (Signed)
INR at goal Pt seen in infusion today Pt also to receive fluids for dehydration and fatigue Pt with diarrhea and multiple BM this week possibly due to Valtrex This med has been discontinued  Pt is doing well with her coumadin but she is worried about having supratherapeutic INRs and would like to check INRs weekly This is reasonable and we will coordinate around her treatments No missed or extra doses No diet changes or other med changes No unusual bleeding or bruising Plan: No changes.  Continue coumadin 5mg  Tuesday, Thursday and 2.5mg  other days. Recheck INR with scheduled appts on 11/10/14.  Will see you in infusion.

## 2014-11-05 ENCOUNTER — Other Ambulatory Visit: Payer: Self-pay | Admitting: *Deleted

## 2014-11-05 DIAGNOSIS — C9000 Multiple myeloma not having achieved remission: Secondary | ICD-10-CM

## 2014-11-06 ENCOUNTER — Other Ambulatory Visit (HOSPITAL_BASED_OUTPATIENT_CLINIC_OR_DEPARTMENT_OTHER): Payer: Medicare Other

## 2014-11-06 ENCOUNTER — Ambulatory Visit (HOSPITAL_BASED_OUTPATIENT_CLINIC_OR_DEPARTMENT_OTHER): Payer: Medicare Other

## 2014-11-06 DIAGNOSIS — D689 Coagulation defect, unspecified: Secondary | ICD-10-CM

## 2014-11-06 DIAGNOSIS — I82401 Acute embolism and thrombosis of unspecified deep veins of right lower extremity: Secondary | ICD-10-CM

## 2014-11-06 DIAGNOSIS — Z5112 Encounter for antineoplastic immunotherapy: Secondary | ICD-10-CM

## 2014-11-06 DIAGNOSIS — C9000 Multiple myeloma not having achieved remission: Secondary | ICD-10-CM

## 2014-11-06 LAB — CBC WITH DIFFERENTIAL/PLATELET
BASO%: 0.9 % (ref 0.0–2.0)
BASOS ABS: 0.1 10*3/uL (ref 0.0–0.1)
EOS%: 3.9 % (ref 0.0–7.0)
Eosinophils Absolute: 0.3 10*3/uL (ref 0.0–0.5)
HEMATOCRIT: 33.9 % — AB (ref 34.8–46.6)
HEMOGLOBIN: 10.9 g/dL — AB (ref 11.6–15.9)
LYMPH%: 9.4 % — AB (ref 14.0–49.7)
MCH: 28.3 pg (ref 25.1–34.0)
MCHC: 32.2 g/dL (ref 31.5–36.0)
MCV: 88.1 fL (ref 79.5–101.0)
MONO#: 1.8 10*3/uL — ABNORMAL HIGH (ref 0.1–0.9)
MONO%: 25.7 % — AB (ref 0.0–14.0)
NEUT#: 4.1 10*3/uL (ref 1.5–6.5)
NEUT%: 60.1 % (ref 38.4–76.8)
Platelets: 276 10*3/uL (ref 145–400)
RBC: 3.85 10*6/uL (ref 3.70–5.45)
RDW: 16.8 % — ABNORMAL HIGH (ref 11.2–14.5)
WBC: 6.9 10*3/uL (ref 3.9–10.3)
lymph#: 0.7 10*3/uL — ABNORMAL LOW (ref 0.9–3.3)

## 2014-11-06 LAB — PROTIME-INR
INR: 3.3 (ref 2.00–3.50)
PROTIME: 39.6 s — AB (ref 10.6–13.4)

## 2014-11-06 MED ORDER — ONDANSETRON HCL 8 MG PO TABS
8.0000 mg | ORAL_TABLET | Freq: Once | ORAL | Status: AC
  Administered 2014-11-06: 8 mg via ORAL

## 2014-11-06 MED ORDER — SODIUM CHLORIDE 0.9 % IV SOLN
Freq: Once | INTRAVENOUS | Status: AC
  Administered 2014-11-06: 14:00:00 via INTRAVENOUS

## 2014-11-06 MED ORDER — ACETAMINOPHEN 325 MG PO TABS
650.0000 mg | ORAL_TABLET | Freq: Once | ORAL | Status: AC
  Administered 2014-11-06: 650 mg via ORAL

## 2014-11-06 MED ORDER — ACETAMINOPHEN 325 MG PO TABS
ORAL_TABLET | ORAL | Status: AC
  Filled 2014-11-06: qty 2

## 2014-11-06 MED ORDER — ONDANSETRON HCL 8 MG PO TABS
ORAL_TABLET | ORAL | Status: AC
  Filled 2014-11-06: qty 1

## 2014-11-06 MED ORDER — SODIUM CHLORIDE 0.9 % IV SOLN
3.5000 mg | Freq: Once | INTRAVENOUS | Status: AC
  Administered 2014-11-06: 3.5 mg via INTRAVENOUS
  Filled 2014-11-06: qty 4.38

## 2014-11-06 MED ORDER — BORTEZOMIB CHEMO SQ INJECTION 3.5 MG (2.5MG/ML)
1.3000 mg/m2 | Freq: Once | INTRAMUSCULAR | Status: AC
Start: 2014-11-06 — End: 2014-11-06
  Administered 2014-11-06: 2.5 mg via SUBCUTANEOUS
  Filled 2014-11-06: qty 2.5

## 2014-11-06 NOTE — Progress Notes (Signed)
Susanne Borders, NP notified of patient's c/o headache since this morning. Verbal order received for 650 mg tylenol x1.

## 2014-11-06 NOTE — Patient Instructions (Signed)
[facility] Discharge Instructions for Patients Receiving Chemotherapy  Today you received the following chemotherapy agents: Zometa and Velcade.  To help prevent nausea and vomiting after your treatment, we encourage you to take your nausea medication: Compazine 10 mg every 6 hours as needed.   If you develop nausea and vomiting that is not controlled by your nausea medication, call the clinic.   BELOW ARE SYMPTOMS THAT SHOULD BE REPORTED IMMEDIATELY:  *FEVER GREATER THAN 100.5 F  *CHILLS WITH OR WITHOUT FEVER  NAUSEA AND VOMITING THAT IS NOT CONTROLLED WITH YOUR NAUSEA MEDICATION  *UNUSUAL SHORTNESS OF BREATH  *UNUSUAL BRUISING OR BLEEDING  TENDERNESS IN MOUTH AND THROAT WITH OR WITHOUT PRESENCE OF ULCERS  *URINARY PROBLEMS  *BOWEL PROBLEMS  UNUSUAL RASH Items with * indicate a potential emergency and should be followed up as soon as possible.  Feel free to call the clinic you have any questions or concerns. The clinic phone number is [phone].

## 2014-11-09 ENCOUNTER — Other Ambulatory Visit: Payer: Self-pay | Admitting: *Deleted

## 2014-11-09 DIAGNOSIS — C9000 Multiple myeloma not having achieved remission: Secondary | ICD-10-CM

## 2014-11-10 ENCOUNTER — Ambulatory Visit (HOSPITAL_BASED_OUTPATIENT_CLINIC_OR_DEPARTMENT_OTHER): Payer: Medicare Other | Admitting: Oncology

## 2014-11-10 ENCOUNTER — Ambulatory Visit: Payer: Medicare Other | Admitting: Pharmacist

## 2014-11-10 ENCOUNTER — Telehealth: Payer: Self-pay | Admitting: Oncology

## 2014-11-10 ENCOUNTER — Ambulatory Visit: Payer: Medicare Other | Admitting: Emergency Medicine

## 2014-11-10 ENCOUNTER — Ambulatory Visit (HOSPITAL_BASED_OUTPATIENT_CLINIC_OR_DEPARTMENT_OTHER): Payer: Medicare Other

## 2014-11-10 ENCOUNTER — Other Ambulatory Visit (HOSPITAL_BASED_OUTPATIENT_CLINIC_OR_DEPARTMENT_OTHER): Payer: Medicare Other

## 2014-11-10 VITALS — BP 139/61 | HR 76 | Temp 98.4°F | Resp 18 | Ht 63.0 in | Wt 171.4 lb

## 2014-11-10 DIAGNOSIS — I82401 Acute embolism and thrombosis of unspecified deep veins of right lower extremity: Secondary | ICD-10-CM

## 2014-11-10 DIAGNOSIS — D689 Coagulation defect, unspecified: Secondary | ICD-10-CM

## 2014-11-10 DIAGNOSIS — D62 Acute posthemorrhagic anemia: Secondary | ICD-10-CM

## 2014-11-10 DIAGNOSIS — Z86711 Personal history of pulmonary embolism: Secondary | ICD-10-CM

## 2014-11-10 DIAGNOSIS — Z5112 Encounter for antineoplastic immunotherapy: Secondary | ICD-10-CM

## 2014-11-10 DIAGNOSIS — E119 Type 2 diabetes mellitus without complications: Secondary | ICD-10-CM

## 2014-11-10 DIAGNOSIS — C9000 Multiple myeloma not having achieved remission: Secondary | ICD-10-CM

## 2014-11-10 LAB — PROTEIN ELECTROPHORESIS, SERUM
ALPHA-1-GLOBULIN: 4.5 % (ref 2.9–4.9)
ALPHA-2-GLOBULIN: 9.9 % (ref 7.1–11.8)
Albumin ELP: 49.6 % — ABNORMAL LOW (ref 55.8–66.1)
BETA 2: 26.6 % — AB (ref 3.2–6.5)
BETA GLOBULIN: 7 % (ref 4.7–7.2)
GAMMA GLOBULIN: 2.4 % — AB (ref 11.1–18.8)
M-SPIKE, %: 0.33 g/dL
Total Protein, Serum Electrophoresis: 6.6 g/dL (ref 6.0–8.3)

## 2014-11-10 LAB — PROTIME-INR
INR: 2.4 (ref 2.00–3.50)
Protime: 28.8 Seconds — ABNORMAL HIGH (ref 10.6–13.4)

## 2014-11-10 LAB — CBC WITH DIFFERENTIAL/PLATELET
BASO%: 0.5 % (ref 0.0–2.0)
Basophils Absolute: 0 10*3/uL (ref 0.0–0.1)
EOS ABS: 0.1 10*3/uL (ref 0.0–0.5)
EOS%: 0.7 % (ref 0.0–7.0)
HCT: 36.4 % (ref 34.8–46.6)
HGB: 11.5 g/dL — ABNORMAL LOW (ref 11.6–15.9)
LYMPH#: 0.5 10*3/uL — AB (ref 0.9–3.3)
LYMPH%: 4.9 % — ABNORMAL LOW (ref 14.0–49.7)
MCH: 27.8 pg (ref 25.1–34.0)
MCHC: 31.6 g/dL (ref 31.5–36.0)
MCV: 87.9 fL (ref 79.5–101.0)
MONO#: 0.6 10*3/uL (ref 0.1–0.9)
MONO%: 5.4 % (ref 0.0–14.0)
NEUT%: 88.5 % — ABNORMAL HIGH (ref 38.4–76.8)
NEUTROS ABS: 9.2 10*3/uL — AB (ref 1.5–6.5)
Platelets: 183 10*3/uL (ref 145–400)
RBC: 4.13 10*6/uL (ref 3.70–5.45)
RDW: 17.7 % — AB (ref 11.2–14.5)
WBC: 10.4 10*3/uL — AB (ref 3.9–10.3)

## 2014-11-10 LAB — POCT INR: INR: 2.4

## 2014-11-10 LAB — KAPPA/LAMBDA LIGHT CHAINS
KAPPA LAMBDA RATIO: 214 — AB (ref 0.26–1.65)
Kappa free light chain: 10.7 mg/dL — ABNORMAL HIGH (ref 0.33–1.94)
Lambda Free Lght Chn: 0.05 mg/dL — ABNORMAL LOW (ref 0.57–2.63)

## 2014-11-10 MED ORDER — ONDANSETRON HCL 8 MG PO TABS
ORAL_TABLET | ORAL | Status: AC
  Filled 2014-11-10: qty 1

## 2014-11-10 MED ORDER — BORTEZOMIB CHEMO SQ INJECTION 3.5 MG (2.5MG/ML)
1.3000 mg/m2 | Freq: Once | INTRAMUSCULAR | Status: AC
  Administered 2014-11-10: 2.5 mg via SUBCUTANEOUS
  Filled 2014-11-10: qty 2.5

## 2014-11-10 MED ORDER — ONDANSETRON HCL 8 MG PO TABS
8.0000 mg | ORAL_TABLET | Freq: Once | ORAL | Status: AC
  Administered 2014-11-10: 8 mg via ORAL

## 2014-11-10 NOTE — Patient Instructions (Signed)
INR at goal No changes. Continue coumadin 5mg  Tuesday, Thursday and 2.5mg  other days. Recheck INR with scheduled appts on 11/24/14.  Will see you in infusion

## 2014-11-10 NOTE — Progress Notes (Signed)
[facility]  [phone] [phone]     ID: [name] OB: [birth year]  MR#: [id number]  FWY#:[id number]  PCP: Jenny Reichmann, MD GYN:   SU:  OTHER MD: Jodi Marble, MD;  Darlin Coco, MD  CHIEF COMPLAINT: Multiple myeloma, under active treatment CURRENT TREATMENT: dexamethasone, bortezomib, lenalidomide zometa  MYELOMA HISTORY: From the original consult note, dated 03/24/2014:  "The patient has a history of PE following arthroscopic surgery,as well as LLE DVT requiring Coumadin therapy since October of 2014. On 4/7 she presented to the ED with a 2 day history of hemoptysis and mucous material. INR was elevated at 6 requiring FFP and Vit K with improvement of coagulopathy (INR 1.4 this morning). Anticoagulation is on hold until hemoptysis resolves.CT angiogram on 4/7 was negative for pulmonary embolus. Bilateral pneumonia was suspected, requiring IV antibiotics.  Interestingly, diffusely heterogeneous appearance to the visualized bones were seen, more notable than on a prior CT in 2010. Bone scan on 4/8 showed old fractures in posterior 8-9 th ribs, and in a lumbar XR a new lumbar spinal fracture was noted, not picked up by the bone scan. Given the mottled appearance of the CT films,workup for multiple myeloma was initiated: She had anemia in the setting of chronic disease, iron deficiency and blood loss, and mild renal insufficiency was seen with a Cr 1.62. Sodium was normal. Her Calcium however was 12.3 on admission, today at greater than 15 receiving Calcitonin 100U nasal spray, Zometa 4 mg and hydration IV. SPEP / UPEP / IFE are pending. "  Her subsequent history is as detailed below.  INTERVAL HISTORY: [name] returns today for follow up of her multiple myeloma, accompanied by her husband. Today is day 8, cycle 10 of her subcutaneous bortezomib which she receives on days 1, 4, 8, and 11 of each 21 day cycle. She also takes oral dexamethasone, 20  mg every Tuesday and Wednesday.  She has been on zoledronic acid, most recent dose 11/06/2014. Her lenalidomide was held mid-September but was resumed early October 2015. She receives that 21 days on, 7 days off every 28 day cycle.  REVIEW OF SYSTEMS: Dillan had severe diarrhea every time we tried acyclovir or valacyclovir. Accordingly we have had to stop those medications. She continues on Coumadin and has not had any further significant bleeds, although when she blows her nose still sometimes she finds a little bit of dried blood. Sometimes when she coughs phlegm will be a little bit brownish. She has some pain on the underside of both feet, left greater than right, for which she takes Tylenol. Rubbing it also helps. She has minimal numbness over her toe tips, not her hands. She describes her appetite as poor and occasionally she has abdominal cramps. She tells me her diabetes is "doing well". A detailed review of systems today was otherwise stable.  PAST MEDICAL HISTORY: Past Medical History  Diagnosis Date  . Neurofibromatosis   . HTN (hypertension)   . Obesity   . Hyperlipidemia     statin intolerant. LDL is 83  . UTI (lower urinary tract infection) 05/29/12  . Pulmonary embolism 09/17/2013  . STEMI (ST elevation myocardial infarction) 05/29/2012    Inferolateral STEMI ; s/p DES to RCA, nl EF  . Multiple myeloma     Dr. Jana Hakim   . DVT (deep venous thrombosis) 08/2014    RLE  . Type II diabetes mellitus   . History of blood transfusion ?2015    "blood count  dropped real low"  . Osteoarthritis, knee   . Arthritis     "shoulders hurt" (09/30/2014)    PAST SURGICAL HISTORY: Past Surgical History  Procedure Laterality Date  . Clavicle surgery Left ~ 1960  . Ankle fracture surgery Right ?1990's  . Total knee arthroplasty Left 09/15/2013    Procedure: TOTAL KNEE ARTHROPLASTY;  Surgeon: Vickey Huger, MD;  Location: [facility];  Service: Orthopedics;  Laterality: Left;  . Hernia repair    .  Coronary angioplasty with stent placement  05/2012    "1"  . Tonsillectomy and adenoidectomy  1950's  . Fracture surgery    . Bone marrow biopsy  03/2014    Dr. Jana Hakim   . Vaginal hysterectomy  1990's    FAMILY HISTORY Family History  Problem Relation Age of Onset  . Heart disease Mother   . Arthritis Mother   . Stroke Mother   . Heart disease Son     SOCIAL HISTORY:    (reviewed 05/19/2014)  Married. Lives in [place] with husband of 51 years. 2 sons in good health.      ADVANCED DIRECTIVES:    HEALTH MAINTENANCE: (updated 05/19/2014)  History  Substance Use Topics  . Smoking status: Never Smoker   . Smokeless tobacco: Never Used  . Alcohol Use: No     Colonoscopy:  not on file   PAP: not on file   Bone density: not on file   Lipid panel: December 2014   Allergies  Allergen Reactions  . Codeine Nausea And Vomiting    Sees things  . Hydrocodone Nausea And Vomiting    Sees things  . Niaspan [Niacin Er] Nausea Only  . Robaxin [Methocarbamol] Other (See Comments)    Made patient feel funny  . Statins Nausea And Vomiting and Other (See Comments)    Myalgias/leg pain with atorvastatin, rosuvastatin, lovastatin, simvastatin  . Tetracycline Other (See Comments)    Pt doesn't remember reaction  . Zetia [Ezetimibe] Nausea And Vomiting  . Zithromax [Azithromycin] Nausea And Vomiting    Current Outpatient Prescriptions  Medication Sig Dispense Refill  . acetaminophen (TYLENOL) 325 MG tablet Take 1-2 tablets (325-650 mg total) by mouth every 4 (four) hours as needed for mild pain.    . cetirizine (ZYRTEC) 10 MG tablet Take 10 mg by mouth daily as needed.     . clindamycin (CLINDAGEL) 1 % gel Apply topically 2 (two) times daily. 30 g 0  . dexamethasone (DECADRON) 4 MG tablet Take 5 tablets (20 mg total) by mouth as directed. 5 pills on Tuesday, Wednesday only 45 tablet 0  . glimepiride (AMARYL) 2 MG tablet Take 2 tablets by mouth every morning with breakfast.  On  the days that you take prednisone or have chemotherapy take another dose at the evening meal 90 tablet 1  . glucose blood (ONE TOUCH ULTRA TEST) test strip 1 each by Other route 3 (three) times daily. 100 each 12  . Lancets MISC Check glucose 3 times daily 100 each 6  . lenalidomide (REVLIMID) 10 MG capsule Take 1 capsule (10 mg total) by mouth daily. 21 capsule 0  . metoprolol tartrate (LOPRESSOR) 25 MG tablet Take 25 mg by mouth 2 (two) times daily.    . Multiple Vitamin (MULTIVITAMIN WITH MINERALS) TABS tablet Take 1 tablet by mouth at bedtime. Centrum Silver    . Multiple Vitamins-Minerals (OCUVITE PO) Take 1 tablet by mouth daily.     Marland Kitchen NITROSTAT 0.4 MG SL tablet DISSOLVE 1 TABLET  UNDER THE TONGUE EVERY 5 MINUTES AS NEEDED FOR CHEST PAIN. UP TO 3 DOSES 25 tablet 0  . nystatin (MYCOSTATIN/NYSTOP) 100000 UNIT/GM POWD Apply 1 g topically 3 (three) times daily as needed. 30 g 1  . polyethylene glycol (MIRALAX / GLYCOLAX) packet Take 17 g by mouth daily.    . prochlorperazine (COMPAZINE) 10 MG tablet Take 1 tablet (10 mg total) by mouth every 6 (six) hours as needed (Nausea or vomiting). 90 tablet 4  . traMADol (ULTRAM) 50 MG tablet Take 1 tablet (50 mg total) by mouth every 6 (six) hours as needed for moderate pain. 30 tablet 2  . valACYclovir (VALTREX) 1000 MG tablet Take 1 tablet (1,000 mg total) by mouth 2 (two) times daily. 30 tablet 4  . warfarin (COUMADIN) 5 MG tablet Take 1 tablet (5 mg total) by mouth daily. (Patient taking differently: Take 5 mg by mouth daily. Pt takes 5 mg tablet on Tues and Thursday. Pt takes 1/2 tablet(2.5 mg) on Mon, Wed, Fri, Sat, Sun) 1 tablet 0   No current facility-administered medications for this visit.    OBJECTIVE: Middle-aged white woman who appears stated age 28 Vitals:   11/10/14 1242  BP: 139/61  Pulse: 76  Temp: 98.4 F (36.9 C)  Resp: 18     Body mass index is 30.37 kg/(m^2).    ECOG FS:2 - Symptomatic, <50% confined to bed Filed Weights    11/10/14 1242  Weight: 171 lb 6.4 oz (77.747 kg)   Sclerae unicteric, pupils equal and reactive Oropharynx clear, no thrush or other lesions noted No cervical or supraclavicular adenopathy Lungs no rales or rhonchi Heart regular rate and rhythm Abd soft, obese, nontender, positive bowel sounds MSK no focal spinal tenderness Neuro: nonfocal, well oriented, anxious affect Breasts: Deferred    LAB RESULTS:  Lab Results  Component Value Date   WBC 10.4* 11/10/2014   NEUTROABS 9.2* 11/10/2014   HGB 11.5* 11/10/2014   HCT 36.4 11/10/2014   MCV 87.9 11/10/2014   PLT 183 11/10/2014      Chemistry      Component Value Date/Time   NA 140 11/03/2014 1326   NA 139 10/20/2014 1155   K 4.7 11/03/2014 1326   K 4.4 10/20/2014 1155   CL 100 11/03/2014 1326   CO2 25 11/03/2014 1326   CO2 22 10/20/2014 1155   BUN 20 11/03/2014 1326   BUN 17.4 10/20/2014 1155   CREATININE 0.89 11/03/2014 1326   CREATININE 1.1 10/20/2014 1155   CREATININE 0.81 12/09/2013 1000   GLU 173 11/08/2010      Component Value Date/Time   CALCIUM 9.2 11/03/2014 1326   CALCIUM 8.5 10/20/2014 1155   CALCIUM 11.1* 03/25/2014 0850   ALKPHOS 59 11/03/2014 1326   ALKPHOS 54 10/20/2014 1155   AST 22 11/03/2014 1326   AST 19 10/20/2014 1155   ALT 24 11/03/2014 1326   ALT 22 10/20/2014 1155   BILITOT 0.6 11/03/2014 1326   BILITOT 0.69 10/20/2014 1155    Results for YANISA, GOODGAME (MRN [id number]) as of 11/14/2014 10:54  Ref. Range 09/22/2014 10:15 09/22/2014 10:15 10/13/2014 10:40 10/16/2014 12:44 11/06/2014 12:41 11/13/2014 12:47  M-SPIKE, % No range found 1.59  0.26 0.36 0.33   Results for CATERRA, OSTROFF (MRN [id number]) as of 11/14/2014 10:54  Ref. Range 06/30/2014 10:31 07/21/2014 10:12 08/11/2014 10:27 08/14/2014 07:59 09/01/2014 13:10 09/22/2014 10:15 09/22/2014 10:15 10/13/2014 10:40 10/16/2014 12:44 11/06/2014 12:41 11/13/2014 12:47  IgA Latest Range: 69-380 mg/dL 209  878 (  H)    1670 (H) 2310 (H) 1790 (H)   1470 (H)  Results for KENDEL, PESNELL (MRN [id number]) as of 11/14/2014 10:54  Ref. Range 06/30/2014 10:31 07/21/2014 10:12 08/11/2014 10:27 08/14/2014 07:59 09/01/2014 13:10 09/22/2014 10:15 09/22/2014 10:15 10/13/2014 10:40 10/16/2014 12:44 11/06/2014 12:41 11/13/2014 12:47  Kappa free light chain Latest Range: 0.33-1.94 mg/dL 1.67 2.71 (H)  5.18 (H) 5.58 (H) 11.30 (H)  16.90 (H) 15.80 (H) 10.70 (H)     STUDIES: No results found.  ASSESSMENT: 68 [place] presenting with hypercalcemia, anemia and mottled bone lesions April 2015, M-spike 3.82 g, IFE showing IgA/kappa myeloma with bone marrow biopsy 03/30/2014 with 88% plasma cells, FISH  t(4,14), deletion 13 and deletion 11 (intermediate risk); initial beta-2 microglobulin 16.  (1) considered E1A11 study, but unable to tolerate anticoagulation  (2) associated issues:  (a) coagulopathy: history of post-op PE; history of bleeding with warfarin; epistaxis with  ASA 325 ms/ day; switched to 81 mg/ day with fair tolerance  (c) hypercalcemia: started zolendronate 03/26/2014, to be repeated monthly  (d) pain: compression fracture L1, rib fractures: on tylenol and tramadol  (e) immunocompromise: on Septra prophylaxis, valacyclovir and acyclovir both stopped because of uncontrolled diarrhea  (3) started dexamethasone 40 mg/ week (20 mg po on Tues and Weds April 0017); complicated by hyperglycemia with history of diabetes, typically followed by Dr. Everlene Farrier  (4) started bortezomib SQ 04/28/2014, to be given days 1,4, 8 and 11 of ech 21 day cycle  (5) started lenalidomide first week in September 2015, held after 1 cycle with poor tolerance; resumed 09/30/2014 at 10 mg daily, days 1 through 21 of each 28 day cycle  PLAN: Hampton is responding to her treatment and she is tolerating it somewhat better. In particular there has been no significant bleeding problem. I am concerned about the fact that she has not been able to tolerate antivirals.  Her  treatment and supportive therapy would be greatly simplified if we could switch her to lenalidomide alone. In that regard it is noteworthy that when we interrupted the lenalidomide her numbers went up, but when we reinstated the day went back down.  We are going to continue the 3 drug treatment regimens through December. If her numbers continue to look as now at that time, we will stop the Velcade and continue the lenalidomide and dexamethasone. If the numbers remain good after 2-3 months we will stop the dexamethasone at that time and continue the lenalidomide, possibly at a higher dose.  Annette has a good understanding of this plan. She agrees with it. She knows the goal of treatment in her case is control. She will call with any problems that may develop before her next visit here.       Chauncey Cruel, MD   11/10/2014 12:55 PM

## 2014-11-10 NOTE — Progress Notes (Signed)
Pt seen during her infusion today INR=2.4 Back in goal range No changes to report other than a decreased appetite No longer on valcyclovir (rash)  INR at goal No changes. Continue coumadin 5mg  Tuesday, Thursday and 2.5mg  other days.  Recheck INR with scheduled appts on 11/24/14.  Will see you in infusion

## 2014-11-10 NOTE — Patient Instructions (Signed)
[facility] Discharge Instructions for Patients Receiving Chemotherapy  Today you received the following chemotherapy agents Velcade   To help prevent nausea and vomiting after your treatment, we encourage you to take your nausea medication Compazine 10 mg every 6 hours as needed.   If you develop nausea and vomiting that is not controlled by your nausea medication, call the clinic.   BELOW ARE SYMPTOMS THAT SHOULD BE REPORTED IMMEDIATELY:  *FEVER GREATER THAN 100.5 F  *CHILLS WITH OR WITHOUT FEVER  NAUSEA AND VOMITING THAT IS NOT CONTROLLED WITH YOUR NAUSEA MEDICATION  *UNUSUAL SHORTNESS OF BREATH  *UNUSUAL BRUISING OR BLEEDING  TENDERNESS IN MOUTH AND THROAT WITH OR WITHOUT PRESENCE OF ULCERS  *URINARY PROBLEMS  *BOWEL PROBLEMS  UNUSUAL RASH Items with * indicate a potential emergency and should be followed up as soon as possible.  Feel free to call the clinic you have any questions or concerns. The clinic phone number is [phone].    

## 2014-11-10 NOTE — Telephone Encounter (Signed)
gv pt appt schedule for nov 2015 thru feb 2016. also message sent to GM for pt re dentist wanting to know if he still wanted her to have jaw bone xray. message asked that desk nurse f/u w/pt. completed by mdale

## 2014-11-13 ENCOUNTER — Ambulatory Visit (HOSPITAL_BASED_OUTPATIENT_CLINIC_OR_DEPARTMENT_OTHER): Payer: Medicare Other

## 2014-11-13 ENCOUNTER — Other Ambulatory Visit: Payer: Self-pay | Admitting: *Deleted

## 2014-11-13 ENCOUNTER — Other Ambulatory Visit (HOSPITAL_BASED_OUTPATIENT_CLINIC_OR_DEPARTMENT_OTHER): Payer: Medicare Other

## 2014-11-13 DIAGNOSIS — C9000 Multiple myeloma not having achieved remission: Secondary | ICD-10-CM

## 2014-11-13 DIAGNOSIS — Z5112 Encounter for antineoplastic immunotherapy: Secondary | ICD-10-CM

## 2014-11-13 LAB — CBC WITH DIFFERENTIAL/PLATELET
BASO%: 1.1 % (ref 0.0–2.0)
Basophils Absolute: 0.1 10*3/uL (ref 0.0–0.1)
EOS%: 0.4 % (ref 0.0–7.0)
Eosinophils Absolute: 0 10*3/uL (ref 0.0–0.5)
HEMATOCRIT: 33.9 % — AB (ref 34.8–46.6)
HEMOGLOBIN: 10.6 g/dL — AB (ref 11.6–15.9)
LYMPH%: 20.8 % (ref 14.0–49.7)
MCH: 27.5 pg (ref 25.1–34.0)
MCHC: 31.3 g/dL — AB (ref 31.5–36.0)
MCV: 87.9 fL (ref 79.5–101.0)
MONO#: 1.7 10*3/uL — AB (ref 0.1–0.9)
MONO%: 29 % — ABNORMAL HIGH (ref 0.0–14.0)
NEUT#: 2.8 10*3/uL (ref 1.5–6.5)
NEUT%: 48.7 % (ref 38.4–76.8)
Platelets: 176 10*3/uL (ref 145–400)
RBC: 3.86 10*6/uL (ref 3.70–5.45)
RDW: 18.3 % — AB (ref 11.2–14.5)
WBC: 5.8 10*3/uL (ref 3.9–10.3)
lymph#: 1.2 10*3/uL (ref 0.9–3.3)

## 2014-11-13 MED ORDER — ONDANSETRON HCL 8 MG PO TABS
ORAL_TABLET | ORAL | Status: AC
  Filled 2014-11-13: qty 1

## 2014-11-13 MED ORDER — ACETAMINOPHEN 325 MG PO TABS
650.0000 mg | ORAL_TABLET | Freq: Four times a day (QID) | ORAL | Status: DC | PRN
  Administered 2014-11-13: 650 mg via ORAL

## 2014-11-13 MED ORDER — BORTEZOMIB CHEMO SQ INJECTION 3.5 MG (2.5MG/ML)
1.3000 mg/m2 | Freq: Once | INTRAMUSCULAR | Status: AC
  Administered 2014-11-13: 2.5 mg via SUBCUTANEOUS
  Filled 2014-11-13: qty 2.5

## 2014-11-13 MED ORDER — ONDANSETRON HCL 8 MG PO TABS
8.0000 mg | ORAL_TABLET | Freq: Once | ORAL | Status: AC
  Administered 2014-11-13: 8 mg via ORAL

## 2014-11-13 MED ORDER — ACETAMINOPHEN 325 MG PO TABS
ORAL_TABLET | ORAL | Status: AC
  Filled 2014-11-13: qty 2

## 2014-11-13 NOTE — Patient Instructions (Signed)
[facility] Discharge Instructions for Patients Receiving Chemotherapy  Today you received the following chemotherapy agent: Velcade   To help prevent nausea and vomiting after your treatment, we encourage you to take your nausea medication as prescribed.    If you develop nausea and vomiting that is not controlled by your nausea medication, call the clinic.   BELOW ARE SYMPTOMS THAT SHOULD BE REPORTED IMMEDIATELY:  *FEVER GREATER THAN 100.5 F  *CHILLS WITH OR WITHOUT FEVER  NAUSEA AND VOMITING THAT IS NOT CONTROLLED WITH YOUR NAUSEA MEDICATION  *UNUSUAL SHORTNESS OF BREATH  *UNUSUAL BRUISING OR BLEEDING  TENDERNESS IN MOUTH AND THROAT WITH OR WITHOUT PRESENCE OF ULCERS  *URINARY PROBLEMS  *BOWEL PROBLEMS  UNUSUAL RASH Items with * indicate a potential emergency and should be followed up as soon as possible.  Feel free to call the clinic you have any questions or concerns. The clinic phone number is [phone].

## 2014-11-14 LAB — IGG, IGA, IGM
IGA: 1470 mg/dL — AB (ref 69–380)
IGM, SERUM: 5 mg/dL — AB (ref 52–322)
IgG (Immunoglobin G), Serum: 209 mg/dL — ABNORMAL LOW (ref 690–1700)

## 2014-11-16 ENCOUNTER — Other Ambulatory Visit: Payer: Self-pay | Admitting: *Deleted

## 2014-11-16 MED ORDER — LENALIDOMIDE 10 MG PO CAPS: 10.0000 mg | ORAL_CAPSULE | Freq: Every day | ORAL | Status: DC

## 2014-11-17 NOTE — Telephone Encounter (Signed)
RECEIVED A FAX FROM [facility] CONCERNING A CONFIRMATION OF FACSIMILE RECEIPT FOR PT. REFERRAL. 

## 2014-11-18 ENCOUNTER — Telehealth: Payer: Self-pay | Admitting: *Deleted

## 2014-11-18 NOTE — Addendum Note (Signed)
Addended by: Laureen Abrahams on: 11/18/2014 07:00 PM   Modules accepted: Orders, Medications

## 2014-11-18 NOTE — Telephone Encounter (Signed)
This RN contacted pt's dentist Dr [name] to inquire if pt is in need of any dental care that would prohibit use of a bio phosphate therapy for multiple myeloma.  Per dental clinic pt is up to date on care and is in need of a crown but no other invasive procedure needed.  This note will be given to MD.

## 2014-11-23 ENCOUNTER — Other Ambulatory Visit: Payer: Self-pay | Admitting: *Deleted

## 2014-11-23 DIAGNOSIS — C9000 Multiple myeloma not having achieved remission: Secondary | ICD-10-CM

## 2014-11-24 ENCOUNTER — Telehealth: Payer: Self-pay | Admitting: Oncology

## 2014-11-24 ENCOUNTER — Ambulatory Visit: Payer: Medicare Other | Admitting: Pharmacist

## 2014-11-24 ENCOUNTER — Other Ambulatory Visit: Payer: Medicare Other

## 2014-11-24 ENCOUNTER — Ambulatory Visit (HOSPITAL_BASED_OUTPATIENT_CLINIC_OR_DEPARTMENT_OTHER): Payer: Medicare Other

## 2014-11-24 ENCOUNTER — Ambulatory Visit (HOSPITAL_BASED_OUTPATIENT_CLINIC_OR_DEPARTMENT_OTHER): Payer: Medicare Other | Admitting: Oncology

## 2014-11-24 ENCOUNTER — Other Ambulatory Visit (HOSPITAL_BASED_OUTPATIENT_CLINIC_OR_DEPARTMENT_OTHER): Payer: Medicare Other

## 2014-11-24 VITALS — BP 121/55 | HR 87 | Temp 98.2°F | Resp 18 | Ht 63.0 in | Wt 173.4 lb

## 2014-11-24 DIAGNOSIS — C9000 Multiple myeloma not having achieved remission: Secondary | ICD-10-CM

## 2014-11-24 DIAGNOSIS — D689 Coagulation defect, unspecified: Secondary | ICD-10-CM

## 2014-11-24 DIAGNOSIS — M1712 Unilateral primary osteoarthritis, left knee: Secondary | ICD-10-CM

## 2014-11-24 DIAGNOSIS — D62 Acute posthemorrhagic anemia: Secondary | ICD-10-CM

## 2014-11-24 DIAGNOSIS — Z86711 Personal history of pulmonary embolism: Secondary | ICD-10-CM

## 2014-11-24 DIAGNOSIS — Z5112 Encounter for antineoplastic immunotherapy: Secondary | ICD-10-CM

## 2014-11-24 DIAGNOSIS — I82401 Acute embolism and thrombosis of unspecified deep veins of right lower extremity: Secondary | ICD-10-CM

## 2014-11-24 LAB — CBC WITH DIFFERENTIAL/PLATELET
BASO%: 0.4 % (ref 0.0–2.0)
Basophils Absolute: 0 10*3/uL (ref 0.0–0.1)
EOS%: 2.7 % (ref 0.0–7.0)
Eosinophils Absolute: 0.2 10*3/uL (ref 0.0–0.5)
HCT: 33.2 % — ABNORMAL LOW (ref 34.8–46.6)
HEMOGLOBIN: 10.5 g/dL — AB (ref 11.6–15.9)
LYMPH#: 0.3 10*3/uL — AB (ref 0.9–3.3)
LYMPH%: 4 % — ABNORMAL LOW (ref 14.0–49.7)
MCH: 27.7 pg (ref 25.1–34.0)
MCHC: 31.6 g/dL (ref 31.5–36.0)
MCV: 87.6 fL (ref 79.5–101.0)
MONO#: 0.2 10*3/uL (ref 0.1–0.9)
MONO%: 2 % (ref 0.0–14.0)
NEUT#: 7.7 10*3/uL — ABNORMAL HIGH (ref 1.5–6.5)
NEUT%: 90.9 % — ABNORMAL HIGH (ref 38.4–76.8)
Platelets: 312 10*3/uL (ref 145–400)
RBC: 3.79 10*6/uL (ref 3.70–5.45)
RDW: 16 % — AB (ref 11.2–14.5)
WBC: 8.5 10*3/uL (ref 3.9–10.3)

## 2014-11-24 LAB — COMPREHENSIVE METABOLIC PANEL (CC13)
ALK PHOS: 49 U/L (ref 40–150)
ALT: 18 U/L (ref 0–55)
AST: 20 U/L (ref 5–34)
Albumin: 2.7 g/dL — ABNORMAL LOW (ref 3.5–5.0)
Anion Gap: 14 mEq/L — ABNORMAL HIGH (ref 3–11)
BUN: 12.8 mg/dL (ref 7.0–26.0)
CALCIUM: 8.3 mg/dL — AB (ref 8.4–10.4)
CHLORIDE: 104 meq/L (ref 98–109)
CO2: 22 mEq/L (ref 22–29)
Creatinine: 1 mg/dL (ref 0.6–1.1)
EGFR: 55 mL/min/{1.73_m2} — ABNORMAL LOW (ref >90)
Glucose: 247 mg/dl — ABNORMAL HIGH (ref 70–140)
Potassium: 4.5 mEq/L (ref 3.5–5.1)
SODIUM: 140 meq/L (ref 136–145)
TOTAL PROTEIN: 6.8 g/dL (ref 6.4–8.3)
Total Bilirubin: 0.47 mg/dL (ref 0.20–1.20)

## 2014-11-24 LAB — PROTIME-INR
INR: 2.2 (ref 2.00–3.50)
Protime: 26.4 Seconds — ABNORMAL HIGH (ref 10.6–13.4)

## 2014-11-24 LAB — POCT INR: INR: 2.2

## 2014-11-24 MED ORDER — ONDANSETRON HCL 8 MG PO TABS
8.0000 mg | ORAL_TABLET | Freq: Once | ORAL | Status: AC
  Administered 2014-11-24: 8 mg via ORAL

## 2014-11-24 MED ORDER — ACETAMINOPHEN 500 MG PO TABS
500.0000 mg | ORAL_TABLET | Freq: Once | ORAL | Status: AC
  Administered 2014-11-24: 500 mg via ORAL

## 2014-11-24 MED ORDER — ACETAMINOPHEN 500 MG PO TABS
ORAL_TABLET | ORAL | Status: AC
  Filled 2014-11-24: qty 1

## 2014-11-24 MED ORDER — BORTEZOMIB CHEMO SQ INJECTION 3.5 MG (2.5MG/ML)
1.3000 mg/m2 | Freq: Once | INTRAMUSCULAR | Status: AC
  Administered 2014-11-24: 2.5 mg via SUBCUTANEOUS
  Filled 2014-11-24: qty 2.5

## 2014-11-24 MED ORDER — ONDANSETRON HCL 8 MG PO TABS
ORAL_TABLET | ORAL | Status: AC
  Filled 2014-11-24: qty 1

## 2014-11-24 NOTE — Progress Notes (Signed)
[name]  [phone] [phone]) [phone]     ID: [name] OB: [birth year]  MR#: [id number]  HQP#:[id number]  PCP: Jenny Reichmann, MD GYN:   SU:  OTHER MD: Jodi Marble, MD;  Darlin Coco, MD  CHIEF COMPLAINT: Multiple myeloma, under active treatment CURRENT TREATMENT: dexamethasone, bortezomib, lenalidomide zometa  MYELOMA HISTORY: From the original consult note, dated 03/24/2014:  "The patient has a history of PE following arthroscopic surgery,as well as LLE DVT requiring Coumadin therapy since October of 2014. On 4/7 she presented to the ED with a 2 day history of hemoptysis and mucous material. INR was elevated at 6 requiring FFP and Vit K with improvement of coagulopathy (INR 1.4 this morning). Anticoagulation is on hold until hemoptysis resolves.CT angiogram on 4/7 was negative for pulmonary embolus. Bilateral pneumonia was suspected, requiring IV antibiotics.  Interestingly, diffusely heterogeneous appearance to the visualized bones were seen, more notable than on a prior CT in 2010. Bone scan on 4/8 showed old fractures in posterior 8-9 th ribs, and in a lumbar XR a new lumbar spinal fracture was noted, not picked up by the bone scan. Given the mottled appearance of the CT films,workup for multiple myeloma was initiated: She had anemia in the setting of chronic disease, iron deficiency and blood loss, and mild renal insufficiency was seen with a Cr 1.62. Sodium was normal. Her Calcium however was 12.3 on admission, today at greater than 15 receiving Calcitonin 100U nasal spray, Zometa 4 mg and hydration IV. SPEP / UPEP / IFE are pending. "  Her subsequent history is as detailed below.  INTERVAL HISTORY: [name] returns today for follow up of her multiple myeloma, accompanied by her husband. Today is day 1, cycle 11 of her subcutaneous bortezomib which she receives on days 1, 4, 8, and 11 of each 21 day cycle. She also takes oral dexamethasone, 20  mg every Tuesday and Wednesday.  She has been on zoledronic acid, most recent dose 11/06/2014. Her lenalidomide was held mid-September but was resumed early October 2015. She receives that 21 days on, 7 days off every 28 day cycle. She lacks approximately 7 days on the current lenalidomide cycle.  REVIEW OF SYSTEMS: [name] appears to be tolerating her treatments better. She gets loose bowel movements a day or 2 after each Velcade dose. Her facial rash is improved, although not completely gone. This morning she had a little bit of a bloody cough, bringing up may be a half a teaspoon full of NK she greenish phlegm. She does not have pleurisy, worsening shortness of breath, or other respiratory problems. Her left eye feels a little bit swollen to her. She continues to have back and joint pain. She has rare headaches. Her sugars are "doing fine". She is doing some cooking for the holidays, which is encouraging. A detailed review of systems today was otherwise stable  PAST MEDICAL HISTORY: Past Medical History  Diagnosis Date  . Neurofibromatosis   . HTN (hypertension)   . Obesity   . Hyperlipidemia     statin intolerant. LDL is 83  . UTI (lower urinary tract infection) 05/29/12  . Pulmonary embolism 09/17/2013  . STEMI (ST elevation myocardial infarction) 05/29/2012    Inferolateral STEMI ; s/p DES to RCA, nl EF  . Multiple myeloma     Dr. Jana Hakim   . DVT (deep venous thrombosis) 08/2014    RLE  . Type II diabetes mellitus   . History of blood transfusion ?2015    "  blood count dropped real low"  . Osteoarthritis, knee   . Arthritis     "shoulders hurt" (09/30/2014)    PAST SURGICAL HISTORY: Past Surgical History  Procedure Laterality Date  . Clavicle surgery Left ~ 1960  . Ankle fracture surgery Right ?1990's  . Total knee arthroplasty Left 09/15/2013    Procedure: TOTAL KNEE ARTHROPLASTY;  Surgeon: Vickey Huger, MD;  Location: [facility];  Service: Orthopedics;  Laterality: Left;  . Hernia  repair    . Coronary angioplasty with stent placement  05/2012    "1"  . Tonsillectomy and adenoidectomy  1950's  . Fracture surgery    . Bone marrow biopsy  03/2014    Dr. Jana Hakim   . Vaginal hysterectomy  1990's    FAMILY HISTORY Family History  Problem Relation Age of Onset  . Heart disease Mother   . Arthritis Mother   . Stroke Mother   . Heart disease Son     SOCIAL HISTORY:    (reviewed 05/19/2014)  Married. Lives in [place] with husband of 29 years. 2 sons in good health.      ADVANCED DIRECTIVES:    HEALTH MAINTENANCE: (updated 05/19/2014)  History  Substance Use Topics  . Smoking status: Never Smoker   . Smokeless tobacco: Never Used  . Alcohol Use: No     Colonoscopy:  not on file   PAP: not on file   Bone density: not on file   Lipid panel: December 2014   Allergies  Allergen Reactions  . Codeine Nausea And Vomiting    Sees things  . Hydrocodone Nausea And Vomiting    Sees things  . Niaspan [Niacin Er] Nausea Only  . Robaxin [Methocarbamol] Other (See Comments)    Made patient feel funny  . Statins Nausea And Vomiting and Other (See Comments)    Myalgias/leg pain with atorvastatin, rosuvastatin, lovastatin, simvastatin  . Tetracycline Other (See Comments)    Pt doesn't remember reaction  . Zetia [Ezetimibe] Nausea And Vomiting  . Zithromax [Azithromycin] Nausea And Vomiting    Current Outpatient Prescriptions  Medication Sig Dispense Refill  . acetaminophen (TYLENOL) 325 MG tablet Take 1-2 tablets (325-650 mg total) by mouth every 4 (four) hours as needed for mild pain.    . cetirizine (ZYRTEC) 10 MG tablet Take 10 mg by mouth daily as needed.     . clindamycin (CLINDAGEL) 1 % gel Apply topically 2 (two) times daily. 30 g 0  . dexamethasone (DECADRON) 4 MG tablet Take 5 tablets (20 mg total) by mouth as directed. 5 pills on Tuesday, Wednesday only 45 tablet 0  . glimepiride (AMARYL) 2 MG tablet Take 2 tablets by mouth every morning with  breakfast.  On the days that you take prednisone or have chemotherapy take another dose at the evening meal 90 tablet 1  . glucose blood (ONE TOUCH ULTRA TEST) test strip 1 each by Other route 3 (three) times daily. 100 each 12  . Lancets MISC Check glucose 3 times daily 100 each 6  . lenalidomide (REVLIMID) 10 MG capsule Take 1 capsule (10 mg total) by mouth daily. 21 capsule 0  . metoprolol tartrate (LOPRESSOR) 25 MG tablet Take 25 mg by mouth 2 (two) times daily.    . Multiple Vitamin (MULTIVITAMIN WITH MINERALS) TABS tablet Take 1 tablet by mouth at bedtime. Centrum Silver    . Multiple Vitamins-Minerals (OCUVITE PO) Take 1 tablet by mouth daily.     Marland Kitchen NITROSTAT 0.4 MG SL tablet DISSOLVE  1 TABLET UNDER THE TONGUE EVERY 5 MINUTES AS NEEDED FOR CHEST PAIN. UP TO 3 DOSES 25 tablet 0  . nystatin (MYCOSTATIN/NYSTOP) 100000 UNIT/GM POWD Apply 1 g topically 3 (three) times daily as needed. 30 g 1  . polyethylene glycol (MIRALAX / GLYCOLAX) packet Take 17 g by mouth daily.    . prochlorperazine (COMPAZINE) 10 MG tablet Take 1 tablet (10 mg total) by mouth every 6 (six) hours as needed (Nausea or vomiting). 90 tablet 4  . traMADol (ULTRAM) 50 MG tablet Take 1 tablet (50 mg total) by mouth every 6 (six) hours as needed for moderate pain. 30 tablet 2  . warfarin (COUMADIN) 5 MG tablet Take 1 tablet (5 mg total) by mouth daily. (Patient taking differently: Take 5 mg by mouth daily. Pt takes 5 mg tablet on Tues and Thursday. Pt takes 1/2 tablet(2.5 mg) on Mon, Wed, Fri, Sat, Sun) 1 tablet 0   No current facility-administered medications for this visit.    OBJECTIVE: Middle-aged white woman in no acute distress Filed Vitals:   11/24/14 1118  BP: 121/55  Pulse: 87  Temp: 98.2 F (36.8 C)  Resp: 18     Body mass index is 30.72 kg/(m^2).    ECOG FS:2 - Symptomatic, <50% confined to bed Filed Weights   11/24/14 1118  Weight: 173 lb 6.4 oz (78.654 kg)   Sclerae unicteric, EOMs intact Oropharynx  clear, specifically no thrush  No cervical or supraclavicular adenopathy Lungs no rales or rhonchi, fair excursion bilaterally Heart regular rate and rhythm Abd soft, obese, nontender, positive bowel sounds MSK no focal spinal tenderness Neuro: nonfocal, well oriented, appropriate affect Breasts: Deferred    LAB RESULTS:  Lab Results  Component Value Date   WBC 8.5 11/24/2014   NEUTROABS 7.7* 11/24/2014   HGB 10.5* 11/24/2014   HCT 33.2* 11/24/2014   MCV 87.6 11/24/2014   PLT 312 11/24/2014      Chemistry      Component Value Date/Time   NA 140 11/03/2014 1326   NA 139 10/20/2014 1155   K 4.7 11/03/2014 1326   K 4.4 10/20/2014 1155   CL 100 11/03/2014 1326   CO2 25 11/03/2014 1326   CO2 22 10/20/2014 1155   BUN 20 11/03/2014 1326   BUN 17.4 10/20/2014 1155   CREATININE 0.89 11/03/2014 1326   CREATININE 1.1 10/20/2014 1155   CREATININE 0.81 12/09/2013 1000   GLU 173 11/08/2010      Component Value Date/Time   CALCIUM 9.2 11/03/2014 1326   CALCIUM 8.5 10/20/2014 1155   CALCIUM 11.1* 03/25/2014 0850   ALKPHOS 59 11/03/2014 1326   ALKPHOS 54 10/20/2014 1155   AST 22 11/03/2014 1326   AST 19 10/20/2014 1155   ALT 24 11/03/2014 1326   ALT 22 10/20/2014 1155   BILITOT 0.6 11/03/2014 1326   BILITOT 0.69 10/20/2014 1155     Results for MAEGAN, BULLER (MRN [id number]) as of 11/24/2014 11:43  Ref. Range 10/13/2014 10:40 10/16/2014 12:44 11/06/2014 12:41 11/13/2014 12:47  IgA Latest Range: 69-380 mg/dL 2310 (H) 1790 (H)  1470 (H)   STUDIES: No results found.  ASSESSMENT: 68 [place] presenting with hypercalcemia, anemia and mottled bone lesions April 2015, M-spike 3.82 g, IFE showing IgA/kappa myeloma with bone marrow biopsy 03/30/2014 with 88% plasma cells, FISH  t(4,14), deletion 13 and deletion 11 (intermediate risk); initial beta-2 microglobulin 16.  (1) considered E1A11 study, but unable to tolerate anticoagulation  (2) associated issues:  (a)  coagulopathy:  history of post-op PE; history of bleeding with warfarin; epistaxis with  ASA 325 ms/ day; switched to 81 mg/ day with fair tolerance  (c) hypercalcemia: started zolendronate 03/26/2014, to be repeated monthly  (d) pain: compression fracture L1, rib fractures: on tylenol and tramadol  (e) immunocompromise: on Septra prophylaxis, valacyclovir and acyclovir both stopped because of uncontrolled diarrhea  (3) started dexamethasone 40 mg/ week (20 mg po on Tues and Weds April 1610); complicated by hyperglycemia with history of diabetes, typically followed by Dr. Everlene Farrier  (4) started bortezomib SQ 04/28/2014, to be given days 1,4, 8 and 11 of ech 21 day cycle  (5) started lenalidomide first week in September 2015, held after 1 cycle with poor tolerance; resumed 09/30/2014 at 10 mg daily, days 1 through 21 of each 28 day cycle  PLAN: [name] is tolerating the treatments better. We are going to continue with the 3 drug program through the end of this month, especially since we are getting a nice drop in her total IgA. I am going to see her again 1228. Depending on those labs we can consider perhaps discontinuing the Velcade, which is what interferes with her normal life the most. I am very reluctant to do ago until we reached some kind of plateau.  She hasn't had any further significant epistaxis, and has only minimal mop to cysts. Once we change her treatments she will be able to come off her Coumadin and that also will be helpful.  [name] has a good understanding of this plan. She agrees with it. She knows the goal of treatment in her case is control. She will call with any problems that may develop before her next visit here.       Chauncey Cruel, MD   11/24/2014 11:44 AM

## 2014-11-24 NOTE — Patient Instructions (Signed)
INR at goal.  No changes. Continue coumadin 5mg  Tuesday, Thursday and 2.5mg  other days.  Recheck INR with scheduled appts on 12/01/14.  Will see you in infusion.

## 2014-11-24 NOTE — Telephone Encounter (Signed)
per pof to sch pt appt-gave pt copy of sch °

## 2014-11-24 NOTE — Progress Notes (Signed)
INR within goal today. Hg/Hct:  10.5/33.2, Pltc = 312 No missed or extra coumadin doses. No changes in diet or medications. No unusual bruising.  Pt did spit up a small amount of "old blood" this morning. Dr. Jana Hakim aware and will continue current therapy. Pt will notify us if this continues, becomes more frequent or severe. No s/s of clotting noted. INR at goal.  No changes.  Pt would like her INR checked weekly. Continue coumadin 5mg  Tuesday, Thursday and 2.5mg  other days.  Recheck INR with scheduled appts on 12/01/14. No charge encounter.

## 2014-11-24 NOTE — Patient Instructions (Signed)
[facility] Discharge Instructions for Patients Receiving Chemotherapy  Today you received the following chemotherapy agents: Velcade.  To help prevent nausea and vomiting after your treatment, we encourage you to take your nausea medication as prescribed.   If you develop nausea and vomiting that is not controlled by your nausea medication, call the clinic.   BELOW ARE SYMPTOMS THAT SHOULD BE REPORTED IMMEDIATELY:  *FEVER GREATER THAN 100.5 F  *CHILLS WITH OR WITHOUT FEVER  NAUSEA AND VOMITING THAT IS NOT CONTROLLED WITH YOUR NAUSEA MEDICATION  *UNUSUAL SHORTNESS OF BREATH  *UNUSUAL BRUISING OR BLEEDING  TENDERNESS IN MOUTH AND THROAT WITH OR WITHOUT PRESENCE OF ULCERS  *URINARY PROBLEMS  *BOWEL PROBLEMS  UNUSUAL RASH Items with * indicate a potential emergency and should be followed up as soon as possible.  Feel free to call the clinic you have any questions or concerns. The clinic phone number is [phone].    

## 2014-11-25 NOTE — Addendum Note (Signed)
Addended by: Laureen Abrahams on: 11/25/2014 05:55 PM   Modules accepted: Medications

## 2014-11-26 ENCOUNTER — Other Ambulatory Visit: Payer: Self-pay | Admitting: *Deleted

## 2014-11-26 ENCOUNTER — Encounter (HOSPITAL_COMMUNITY): Payer: Self-pay | Admitting: Interventional Cardiology

## 2014-11-26 DIAGNOSIS — C9000 Multiple myeloma not having achieved remission: Secondary | ICD-10-CM

## 2014-11-27 ENCOUNTER — Ambulatory Visit (INDEPENDENT_AMBULATORY_CARE_PROVIDER_SITE_OTHER): Payer: Self-pay | Admitting: Pharmacist

## 2014-11-27 ENCOUNTER — Other Ambulatory Visit (HOSPITAL_BASED_OUTPATIENT_CLINIC_OR_DEPARTMENT_OTHER): Payer: Medicare Other

## 2014-11-27 ENCOUNTER — Ambulatory Visit (HOSPITAL_BASED_OUTPATIENT_CLINIC_OR_DEPARTMENT_OTHER): Payer: Medicare Other

## 2014-11-27 DIAGNOSIS — I82401 Acute embolism and thrombosis of unspecified deep veins of right lower extremity: Secondary | ICD-10-CM

## 2014-11-27 DIAGNOSIS — C9000 Multiple myeloma not having achieved remission: Secondary | ICD-10-CM

## 2014-11-27 DIAGNOSIS — D689 Coagulation defect, unspecified: Secondary | ICD-10-CM

## 2014-11-27 DIAGNOSIS — Z5112 Encounter for antineoplastic immunotherapy: Secondary | ICD-10-CM

## 2014-11-27 LAB — CBC WITH DIFFERENTIAL/PLATELET
BASO%: 0.8 % (ref 0.0–2.0)
BASOS ABS: 0.1 10*3/uL (ref 0.0–0.1)
EOS%: 4.2 % (ref 0.0–7.0)
Eosinophils Absolute: 0.3 10*3/uL (ref 0.0–0.5)
HCT: 32.4 % — ABNORMAL LOW (ref 34.8–46.6)
HEMOGLOBIN: 10.3 g/dL — AB (ref 11.6–15.9)
LYMPH#: 0.7 10*3/uL — AB (ref 0.9–3.3)
LYMPH%: 9 % — ABNORMAL LOW (ref 14.0–49.7)
MCH: 27.2 pg (ref 25.1–34.0)
MCHC: 31.7 g/dL (ref 31.5–36.0)
MCV: 85.7 fL (ref 79.5–101.0)
MONO#: 1.8 10*3/uL — ABNORMAL HIGH (ref 0.1–0.9)
MONO%: 22 % — ABNORMAL HIGH (ref 0.0–14.0)
NEUT#: 5.2 10*3/uL (ref 1.5–6.5)
NEUT%: 64 % (ref 38.4–76.8)
Platelets: 285 10*3/uL (ref 145–400)
RBC: 3.78 10*6/uL (ref 3.70–5.45)
RDW: 17.5 % — ABNORMAL HIGH (ref 11.2–14.5)
WBC: 8.2 10*3/uL (ref 3.9–10.3)

## 2014-11-27 LAB — PROTIME-INR
INR: 3 (ref 2.00–3.50)
Protime: 36 Seconds — ABNORMAL HIGH (ref 10.6–13.4)

## 2014-11-27 LAB — POCT INR: INR: 3

## 2014-11-27 MED ORDER — ONDANSETRON HCL 8 MG PO TABS
ORAL_TABLET | ORAL | Status: AC
  Filled 2014-11-27: qty 1

## 2014-11-27 MED ORDER — BORTEZOMIB CHEMO SQ INJECTION 3.5 MG (2.5MG/ML)
1.3000 mg/m2 | Freq: Once | INTRAMUSCULAR | Status: AC
  Administered 2014-11-27: 2.5 mg via SUBCUTANEOUS
  Filled 2014-11-27: qty 2.5

## 2014-11-27 MED ORDER — ONDANSETRON HCL 8 MG PO TABS
8.0000 mg | ORAL_TABLET | Freq: Once | ORAL | Status: AC
Start: 2014-11-27 — End: 2014-11-27
  Administered 2014-11-27: 8 mg via ORAL

## 2014-11-27 NOTE — Progress Notes (Signed)
INR = 3 on Coumadin 2.5 mg daily except 5 mg on Tues/Thurs. Pt had a small amt of blood when she spit this morning but this amt has not changed since she was here on Tuesday & had INR = 2.2 then. We were not expecting her in Coumadin clinic today but lab did an INR on her. INR at goal but higher than it was earlier this week.  Likely due to her weekly dose of Decadron taken on Wednesday.   I have advised pt to cont same Coumadin dose and we will check her INR in 1 week on 12/04/14 and see her in infusion. Kennith Center, Pharm.D., CPP 11/27/2014@1 :36 PM

## 2014-11-27 NOTE — Patient Instructions (Signed)
[facility] Discharge Instructions for Patients Receiving Chemotherapy  Today you received the following chemotherapy agents Velcade.  To help prevent nausea and vomiting after your treatment, we encourage you to take your nausea medication as directed.    If you develop nausea and vomiting that is not controlled by your nausea medication, call the clinic.   BELOW ARE SYMPTOMS THAT SHOULD BE REPORTED IMMEDIATELY:  *FEVER GREATER THAN 100.5 F  *CHILLS WITH OR WITHOUT FEVER  NAUSEA AND VOMITING THAT IS NOT CONTROLLED WITH YOUR NAUSEA MEDICATION  *UNUSUAL SHORTNESS OF BREATH  *UNUSUAL BRUISING OR BLEEDING  TENDERNESS IN MOUTH AND THROAT WITH OR WITHOUT PRESENCE OF ULCERS  *URINARY PROBLEMS  *BOWEL PROBLEMS  UNUSUAL RASH Items with * indicate a potential emergency and should be followed up as soon as possible.  Feel free to call the clinic you have any questions or concerns. The clinic phone number is [phone].    

## 2014-11-28 NOTE — Telephone Encounter (Signed)
No entry 

## 2014-11-30 ENCOUNTER — Encounter: Payer: Self-pay | Admitting: *Deleted

## 2014-11-30 ENCOUNTER — Telehealth: Payer: Self-pay | Admitting: Oncology

## 2014-11-30 NOTE — Telephone Encounter (Signed)
per [place] to sch pt CC-pt aware °

## 2014-11-30 NOTE — Progress Notes (Signed)
RECEIVED A FAX FROM [employer] CONCERNING A CONFIRMATION OF PRESCRIPTION SHIPMENT FOR REVLIMID ON 11/27/14.

## 2014-12-01 ENCOUNTER — Other Ambulatory Visit: Payer: Self-pay

## 2014-12-01 ENCOUNTER — Other Ambulatory Visit (HOSPITAL_BASED_OUTPATIENT_CLINIC_OR_DEPARTMENT_OTHER): Payer: Medicare Other

## 2014-12-01 ENCOUNTER — Ambulatory Visit (HOSPITAL_BASED_OUTPATIENT_CLINIC_OR_DEPARTMENT_OTHER): Payer: Medicare Other

## 2014-12-01 ENCOUNTER — Other Ambulatory Visit: Payer: Self-pay | Admitting: Nurse Practitioner

## 2014-12-01 DIAGNOSIS — C9000 Multiple myeloma not having achieved remission: Secondary | ICD-10-CM

## 2014-12-01 DIAGNOSIS — Z5112 Encounter for antineoplastic immunotherapy: Secondary | ICD-10-CM

## 2014-12-01 LAB — CBC WITH DIFFERENTIAL/PLATELET
BASO%: 0.3 % (ref 0.0–2.0)
BASOS ABS: 0 10*3/uL (ref 0.0–0.1)
EOS%: 1.1 % (ref 0.0–7.0)
Eosinophils Absolute: 0.1 10*3/uL (ref 0.0–0.5)
HCT: 34.1 % — ABNORMAL LOW (ref 34.8–46.6)
HEMOGLOBIN: 10.7 g/dL — AB (ref 11.6–15.9)
LYMPH%: 6.1 % — ABNORMAL LOW (ref 14.0–49.7)
MCH: 26.8 pg (ref 25.1–34.0)
MCHC: 31.2 g/dL — ABNORMAL LOW (ref 31.5–36.0)
MCV: 85.7 fL (ref 79.5–101.0)
MONO#: 0.2 10*3/uL (ref 0.1–0.9)
MONO%: 3.2 % (ref 0.0–14.0)
NEUT#: 6.9 10*3/uL — ABNORMAL HIGH (ref 1.5–6.5)
NEUT%: 89.3 % — AB (ref 38.4–76.8)
Platelets: 156 10*3/uL (ref 145–400)
RBC: 3.98 10*6/uL (ref 3.70–5.45)
RDW: 17.4 % — AB (ref 11.2–14.5)
WBC: 7.7 10*3/uL (ref 3.9–10.3)
lymph#: 0.5 10*3/uL — ABNORMAL LOW (ref 0.9–3.3)

## 2014-12-01 LAB — COMPREHENSIVE METABOLIC PANEL (CC13)
ALK PHOS: 55 U/L (ref 40–150)
ALT: 25 U/L (ref 0–55)
AST: 18 U/L (ref 5–34)
Albumin: 3 g/dL — ABNORMAL LOW (ref 3.5–5.0)
Anion Gap: 13 mEq/L — ABNORMAL HIGH (ref 3–11)
BUN: 13.9 mg/dL (ref 7.0–26.0)
CO2: 22 mEq/L (ref 22–29)
CREATININE: 1 mg/dL (ref 0.6–1.1)
Calcium: 8.2 mg/dL — ABNORMAL LOW (ref 8.4–10.4)
Chloride: 102 mEq/L (ref 98–109)
EGFR: 60 mL/min/{1.73_m2} — ABNORMAL LOW (ref >90)
Glucose: 189 mg/dl — ABNORMAL HIGH (ref 70–140)
POTASSIUM: 4.6 meq/L (ref 3.5–5.1)
Sodium: 138 mEq/L (ref 136–145)
Total Bilirubin: 0.67 mg/dL (ref 0.20–1.20)
Total Protein: 7.1 g/dL (ref 6.4–8.3)

## 2014-12-01 MED ORDER — ONDANSETRON HCL 8 MG PO TABS
ORAL_TABLET | ORAL | Status: AC
  Filled 2014-12-01: qty 1

## 2014-12-01 MED ORDER — BORTEZOMIB CHEMO SQ INJECTION 3.5 MG (2.5MG/ML)
1.3000 mg/m2 | Freq: Once | INTRAMUSCULAR | Status: AC
  Administered 2014-12-01: 2.5 mg via SUBCUTANEOUS
  Filled 2014-12-01: qty 2.5

## 2014-12-01 MED ORDER — ONDANSETRON HCL 8 MG PO TABS
8.0000 mg | ORAL_TABLET | Freq: Once | ORAL | Status: AC
  Administered 2014-12-01: 8 mg via ORAL

## 2014-12-01 NOTE — Patient Instructions (Signed)
[facility] Discharge Instructions for Patients Receiving Chemotherapy  Today you received the following chemotherapy agents: Velcade  To help prevent nausea and vomiting after your treatment, we encourage you to take your nausea medication as prescribed by your physician.   If you develop nausea and vomiting that is not controlled by your nausea medication, call the clinic.   BELOW ARE SYMPTOMS THAT SHOULD BE REPORTED IMMEDIATELY:  *FEVER GREATER THAN 100.5 F  *CHILLS WITH OR WITHOUT FEVER  NAUSEA AND VOMITING THAT IS NOT CONTROLLED WITH YOUR NAUSEA MEDICATION  *UNUSUAL SHORTNESS OF BREATH  *UNUSUAL BRUISING OR BLEEDING  TENDERNESS IN MOUTH AND THROAT WITH OR WITHOUT PRESENCE OF ULCERS  *URINARY PROBLEMS  *BOWEL PROBLEMS  UNUSUAL RASH Items with * indicate a potential emergency and should be followed up as soon as possible.  Feel free to call the clinic you have any questions or concerns. The clinic phone number is [phone].    

## 2014-12-03 ENCOUNTER — Other Ambulatory Visit: Payer: Self-pay | Admitting: *Deleted

## 2014-12-03 DIAGNOSIS — C9000 Multiple myeloma not having achieved remission: Secondary | ICD-10-CM

## 2014-12-03 LAB — PROTEIN ELECTROPHORESIS, SERUM
ALBUMIN ELP: 46 % — AB (ref 55.8–66.1)
Alpha-1-Globulin: 4.8 % (ref 2.9–4.9)
Alpha-2-Globulin: 9 % (ref 7.1–11.8)
BETA GLOBULIN: 7 % (ref 4.7–7.2)
Beta 2: 31.2 % — ABNORMAL HIGH (ref 3.2–6.5)
Gamma Globulin: 2 % — ABNORMAL LOW (ref 11.1–18.8)
M-SPIKE, %: 0.36 g/dL
Total Protein, Serum Electrophoresis: 7.1 g/dL (ref 6.0–8.3)

## 2014-12-03 LAB — KAPPA/LAMBDA LIGHT CHAINS
Kappa free light chain: 17 mg/dL — ABNORMAL HIGH (ref 0.33–1.94)
Kappa:Lambda Ratio: 121.43 — ABNORMAL HIGH (ref 0.26–1.65)
Lambda Free Lght Chn: 0.14 mg/dL — ABNORMAL LOW (ref 0.57–2.63)

## 2014-12-04 ENCOUNTER — Ambulatory Visit (HOSPITAL_BASED_OUTPATIENT_CLINIC_OR_DEPARTMENT_OTHER): Payer: Medicare Other | Admitting: Nurse Practitioner

## 2014-12-04 ENCOUNTER — Other Ambulatory Visit (HOSPITAL_BASED_OUTPATIENT_CLINIC_OR_DEPARTMENT_OTHER): Payer: Medicare Other

## 2014-12-04 ENCOUNTER — Ambulatory Visit (HOSPITAL_BASED_OUTPATIENT_CLINIC_OR_DEPARTMENT_OTHER): Payer: Self-pay | Admitting: Pharmacist

## 2014-12-04 ENCOUNTER — Ambulatory Visit (HOSPITAL_BASED_OUTPATIENT_CLINIC_OR_DEPARTMENT_OTHER): Payer: Medicare Other

## 2014-12-04 ENCOUNTER — Other Ambulatory Visit: Payer: Self-pay | Admitting: Nurse Practitioner

## 2014-12-04 ENCOUNTER — Telehealth: Payer: Self-pay | Admitting: Oncology

## 2014-12-04 DIAGNOSIS — I82401 Acute embolism and thrombosis of unspecified deep veins of right lower extremity: Secondary | ICD-10-CM

## 2014-12-04 DIAGNOSIS — C9000 Multiple myeloma not having achieved remission: Secondary | ICD-10-CM

## 2014-12-04 DIAGNOSIS — Z5112 Encounter for antineoplastic immunotherapy: Secondary | ICD-10-CM

## 2014-12-04 DIAGNOSIS — J40 Bronchitis, not specified as acute or chronic: Secondary | ICD-10-CM

## 2014-12-04 LAB — CBC WITH DIFFERENTIAL/PLATELET
BASO%: 0.3 % (ref 0.0–2.0)
Basophils Absolute: 0 10*3/uL (ref 0.0–0.1)
EOS%: 4.2 % (ref 0.0–7.0)
Eosinophils Absolute: 0.3 10*3/uL (ref 0.0–0.5)
HCT: 31.9 % — ABNORMAL LOW (ref 34.8–46.6)
HEMOGLOBIN: 10.2 g/dL — AB (ref 11.6–15.9)
LYMPH#: 0.8 10*3/uL — AB (ref 0.9–3.3)
LYMPH%: 13 % — ABNORMAL LOW (ref 14.0–49.7)
MCH: 27.1 pg (ref 25.1–34.0)
MCHC: 32 g/dL (ref 31.5–36.0)
MCV: 84.8 fL (ref 79.5–101.0)
MONO#: 2 10*3/uL — AB (ref 0.1–0.9)
MONO%: 30.7 % — ABNORMAL HIGH (ref 0.0–14.0)
NEUT#: 3.3 10*3/uL (ref 1.5–6.5)
NEUT%: 51.8 % (ref 38.4–76.8)
PLATELETS: 95 10*3/uL — AB (ref 145–400)
RBC: 3.76 10*6/uL (ref 3.70–5.45)
RDW: 16.5 % — ABNORMAL HIGH (ref 11.2–14.5)
WBC: 6.4 10*3/uL (ref 3.9–10.3)
nRBC: 0 % (ref 0–0)

## 2014-12-04 LAB — PROTIME-INR
INR: 3.2 (ref 2.00–3.50)
Protime: 38.4 Seconds — ABNORMAL HIGH (ref 10.6–13.4)

## 2014-12-04 MED ORDER — LEVOFLOXACIN 500 MG PO TABS: 500.0000 mg | ORAL_TABLET | Freq: Every day | ORAL | Status: DC

## 2014-12-04 MED ORDER — ONDANSETRON HCL 8 MG PO TABS
8.0000 mg | ORAL_TABLET | Freq: Once | ORAL | Status: AC
  Administered 2014-12-04: 8 mg via ORAL

## 2014-12-04 MED ORDER — PROCHLORPERAZINE MALEATE 10 MG PO TABS
ORAL_TABLET | ORAL | Status: AC
  Filled 2014-12-04: qty 1

## 2014-12-04 MED ORDER — BORTEZOMIB CHEMO SQ INJECTION 3.5 MG (2.5MG/ML)
1.3000 mg/m2 | Freq: Once | INTRAMUSCULAR | Status: AC
  Administered 2014-12-04: 2.5 mg via SUBCUTANEOUS
  Filled 2014-12-04: qty 2.5

## 2014-12-04 MED ORDER — ONDANSETRON HCL 8 MG PO TABS
ORAL_TABLET | ORAL | Status: AC
  Filled 2014-12-04: qty 1

## 2014-12-04 NOTE — Telephone Encounter (Signed)
per Gerald Stabs to sch CC-pt is aware

## 2014-12-04 NOTE — Patient Instructions (Signed)
[facility] Discharge Instructions for Patients Receiving Chemotherapy  Today you received the following chemotherapy agents: Velcade. To help prevent nausea and vomiting after your treatment, we encourage you to take your nausea medication.  If you develop nausea and vomiting that is not controlled by your nausea medication, call the clinic.   BELOW ARE SYMPTOMS THAT SHOULD BE REPORTED IMMEDIATELY:  *FEVER GREATER THAN 100.5 F  *CHILLS WITH OR WITHOUT FEVER  NAUSEA AND VOMITING THAT IS NOT CONTROLLED WITH YOUR NAUSEA MEDICATION  *UNUSUAL SHORTNESS OF BREATH  *UNUSUAL BRUISING OR BLEEDING  TENDERNESS IN MOUTH AND THROAT WITH OR WITHOUT PRESENCE OF ULCERS  *URINARY PROBLEMS  *BOWEL PROBLEMS  UNUSUAL RASH Items with * indicate a potential emergency and should be followed up as soon as possible.  Feel free to call the clinic you have any questions or concerns. The clinic phone number is [phone].    

## 2014-12-04 NOTE — Patient Instructions (Signed)
INR above goal today and has been trending up  Decrease coumadin to 5mg  Thursday only and 2.5mg  other days.  Recheck INR with scheduled appts on 12/14/14.  Will see you in infusion.

## 2014-12-04 NOTE — Progress Notes (Addendum)
INR slightly above goal today but has been trending up.  Pt is doing well but does state she is tired Appetite is also low as she states she has been having to force herself to eat This could be resulting in INR increase No other issues Plts lower today at 95, ok to receive velcade per Dr. Jana Hakim No unusual bleeding or bruising No missed or extra doses Will adjust dose slightly Also instructed patient she can have more "greens" over the next few days if she would like Plans: Decrease coumadin to 5mg  Thursday only and 2.5mg  other days.  Recheck INR with scheduled appts on 12/14/14.  Will see you in infusion.  *AddendumSelena Taylor ordering levaquin 500 mg x 7 days.  There is potential for increased INR and INR is already above goal range New Plan: Hold coumadin x 1 dose. Then decrease coumadin further to 2.5 mg (half tablet) daily. Keep regular appointments on 12/28 and we will see pt in infusion

## 2014-12-04 NOTE — Progress Notes (Signed)
Platelets = 95 today; okay to treat per Dr. Jana Hakim

## 2014-12-07 ENCOUNTER — Telehealth: Payer: Self-pay | Admitting: *Deleted

## 2014-12-07 ENCOUNTER — Encounter: Payer: Self-pay | Admitting: Nurse Practitioner

## 2014-12-07 ENCOUNTER — Other Ambulatory Visit: Payer: Self-pay | Admitting: *Deleted

## 2014-12-07 MED ORDER — WARFARIN SODIUM 5 MG PO TABS: 5.0000 mg | ORAL_TABLET | Freq: Every day | ORAL | Status: DC

## 2014-12-07 NOTE — Assessment & Plan Note (Signed)
Patient received cycle 11, day 11 of her Velcade injection today.  She has plans to return on 12/14/2014 for her next Velcade injection; as well as her Zometa infusion.

## 2014-12-07 NOTE — Assessment & Plan Note (Signed)
D'x with a previous DVT and on chronic coumadin; which is managed per the coumadin clinic.

## 2014-12-07 NOTE — Telephone Encounter (Signed)
-----   Message from Drue Second, NP sent at 12/07/2014 11:01 AM EST ----- PROVIDER:  Juluis Rainier ONLY Triage: follow up call 24-48 hours please.

## 2014-12-07 NOTE — Progress Notes (Signed)
will   SYMPTOM MANAGEMENT CLINIC   HPI: [name] 68 y.o. female diagnosed with multiple myeloma.  Currently undergoing Velcade/dexamethasone/Zometa therapy.  Patient presented to the [facility] today to receive cycle 11, day 11 of her   Velcade injection.  She is complaining of a 6-7 day history of a congested, productive cough of yellow secretions.  She denies any specific worsening symptoms of dyspnea, wheezing, or fever/chills.  She states she has tried Zithromax in the past for previous bronchitis symptoms; and it did not work well for her.  Also, patient was prepped diagnosed with a DVT; and continues on Coumadin chronically.  Her Coumadin is managed per the coag clinic.    HPI  CURRENT THERAPY: Upcoming Treatment Dates - MYELOMA INDUCTION NON-TRANSPLANT CANDIDATE Bortezomib SQ / Dexamethasone q21d Days with orders from any treatment category:  12/15/2014      SCHEDULING COMMUNICATION      ondansetron (ZOFRAN) tablet 8 mg      bortezomib SQ (VELCADE) chemo injection 2.5 mg      TREATMENT CONDITIONS      [facility] COMMUNICATION 12/18/2014      SCHEDULING COMMUNICATION      ondansetron (ZOFRAN) tablet 8 mg      bortezomib SQ (VELCADE) chemo injection 2.5 mg      TREATMENT CONDITIONS      [facility]SING COMMUNICATION 12/22/2014      SCHEDULING COMMUNICATION      ondansetron (ZOFRAN) tablet 8 mg      bortezomib SQ (VELCADE) chemo injection 2.5 mg      TREATMENT CONDITIONS    ROS  Past Medical History  Diagnosis Date  . Neurofibromatosis   . HTN (hypertension)   . Obesity   . Hyperlipidemia     statin intolerant. LDL is 83  . UTI (lower urinary tract infection) 05/29/12  . Pulmonary embolism 09/17/2013  . STEMI (ST elevation myocardial infarction) 05/29/2012    Inferolateral STEMI ; s/p DES to RCA, nl EF  . Multiple myeloma     Dr. Jana Hakim   . DVT (deep venous thrombosis) 08/2014    RLE  . Type II diabetes mellitus   . History of blood transfusion  ?2015    "blood count dropped real low"  . Osteoarthritis, knee   . Arthritis     "shoulders hurt" (09/30/2014)    Past Surgical History  Procedure Laterality Date  . Clavicle surgery Left ~ 1960  . Ankle fracture surgery Right ?1990's  . Total knee arthroplasty Left 09/15/2013    Procedure: TOTAL KNEE ARTHROPLASTY;  Surgeon: Vickey Huger, MD;  Location: [facility];  Service: Orthopedics;  Laterality: Left;  . Hernia repair    . Coronary angioplasty with stent placement  05/2012    "1"  . Tonsillectomy and adenoidectomy  1950's  . Fracture surgery    . Bone marrow biopsy  03/2014    Dr. Jana Hakim   . Vaginal hysterectomy  1990's  . Left heart catheterization with coronary angiogram N/A 05/29/2012    Procedure: LEFT HEART CATHETERIZATION WITH CORONARY ANGIOGRAM;  Surgeon: Jettie Booze, MD;  Location: [facility] CATH LAB;  Service: Cardiovascular;  Laterality: N/A;    has Neurofibromatosis; Type II or unspecified type diabetes mellitus without mention of complication, uncontrolled; HTN (hypertension); Obesity; Osteoarthritis, knee; Fatigue; Chest pain at rest; Acute sinusitis, unspecified; Herpes zoster; Dyspnea; STEMI (ST elevation myocardial infarction); Benign hypertensive heart disease without heart failure; Onychomycosis; Pain in joint, ankle and foot; Acute pulmonary embolism; Hypoxemia; Pulmonary  embolism; Long term (current) use of anticoagulants; Fe deficiency anemia; Encounter for therapeutic drug monitoring; Pneumonia, organism unspecified; Hypercalcemia; Major hemoptysis; Acute blood loss anemia; Vertebral fracture, closed; Physical deconditioning; Multiple myeloma; Epistaxis; Leg pain, left; Pain in lower limb; Dysuria; Labial irritation; DVT (deep venous thrombosis), right; Diarrhea; Pulmonary hemorrhage; Hemoptysis; Diabetes mellitus type 2, controlled; Rash; and Bronchitis on her problem list.     is allergic to codeine; hydrocodone; niaspan; robaxin; statins; tetracycline; zetia; and  zithromax.    Medication List       This list is accurate as of: 12/04/14 11:59 PM.  Always use your most recent med list.               acetaminophen 325 MG tablet  Commonly known as:  TYLENOL  Take 1-2 tablets (325-650 mg total) by mouth every 4 (four) hours as needed for mild pain.     cetirizine 10 MG tablet  Commonly known as:  ZYRTEC  Take 10 mg by mouth daily as needed.     clindamycin 1 % gel  Commonly known as:  CLINDAGEL  Apply topically 2 (two) times daily.     dexamethasone 4 MG tablet  Commonly known as:  DECADRON  Take 5 tablets (20 mg total) by mouth as directed. 5 pills on Tuesday, Wednesday only     erythromycin ophthalmic ointment     glimepiride 2 MG tablet  Commonly known as:  AMARYL  - Take 2 tablets by mouth every morning with breakfast.   - On the days that you take prednisone or have chemotherapy take another dose at the evening meal     glucose blood test strip  Commonly known as:  ONE TOUCH ULTRA TEST  1 each by Other route 3 (three) times daily.     Lancets Misc  Check glucose 3 times daily     lenalidomide 10 MG capsule  Commonly known as:  REVLIMID  Take 1 capsule (10 mg total) by mouth daily.     levofloxacin 500 MG tablet  Commonly known as:  LEVAQUIN  Take 1 tablet (500 mg total) by mouth daily.     metoprolol tartrate 25 MG tablet  Commonly known as:  LOPRESSOR  Take 25 mg by mouth 2 (two) times daily.     multivitamin with minerals Tabs tablet  Take 1 tablet by mouth at bedtime. Centrum Silver     NITROSTAT 0.4 MG SL tablet  Generic drug:  nitroGLYCERIN  DISSOLVE 1 TABLET UNDER THE TONGUE EVERY 5 MINUTES AS NEEDED FOR CHEST PAIN. UP TO 3 DOSES     nystatin 100000 UNIT/GM Powd  Apply 1 g topically 3 (three) times daily as needed.     OCUVITE PO  Take 1 tablet by mouth daily.     polyethylene glycol packet  Commonly known as:  MIRALAX / GLYCOLAX  Take 17 g by mouth daily.     prochlorperazine 10 MG tablet    Commonly known as:  COMPAZINE  Take 1 tablet (10 mg total) by mouth every 6 (six) hours as needed (Nausea or vomiting).     promethazine 50 MG tablet  Commonly known as:  PHENERGAN     traMADol 50 MG tablet  Commonly known as:  ULTRAM  Take 1 tablet (50 mg total) by mouth every 6 (six) hours as needed for moderate pain.     warfarin 5 MG tablet  Commonly known as:  COUMADIN  Take 1 tablet (5 mg total) by mouth daily.  PHYSICAL EXAMINATION  Vitals: 135/65, RH 71, temp 98.1   Physical Exam  Constitutional: She is oriented to person, place, and time. Vital signs are normal. She appears unhealthy.  HENT:  Head: Normocephalic and atraumatic.  Mouth/Throat: Oropharynx is clear and moist.  Eyes: Conjunctivae and EOM are normal. Pupils are equal, round, and reactive to light. Right eye exhibits no discharge. Left eye exhibits no discharge. No scleral icterus.  Neck: Normal range of motion. Neck supple. No JVD present. No tracheal deviation present. No thyromegaly present.  Cardiovascular: Normal rate, regular rhythm, normal heart sounds and intact distal pulses.   Pulmonary/Chest: Effort normal and breath sounds normal. No stridor. No respiratory distress. She has no wheezes. She has no rales. She exhibits no tenderness.  Abdominal: Soft. Bowel sounds are normal. She exhibits no distension and no mass. There is no tenderness. There is no rebound and no guarding.  Musculoskeletal: Normal range of motion. She exhibits no edema or tenderness.  Lymphadenopathy:    She has no cervical adenopathy.  Neurological: She is alert and oriented to person, place, and time.  Skin: Skin is warm and dry. No rash noted. No erythema. There is pallor.  Psychiatric: Affect normal.  Nursing note and vitals reviewed.   LABORATORY DATA:. Appointment on 12/04/2014  Component Date Value Ref Range Status  . WBC 12/04/2014 6.4  3.9 - 10.3 10e3/uL Final  . NEUT# 12/04/2014 3.3  1.5 - 6.5 10e3/uL  Final  . HGB 12/04/2014 10.2* 11.6 - 15.9 g/dL Final  . HCT 12/04/2014 31.9* 34.8 - 46.6 % Final  . Platelets 12/04/2014 95* 145 - 400 10e3/uL Final  . MCV 12/04/2014 84.8  79.5 - 101.0 fL Final  . MCH 12/04/2014 27.1  25.1 - 34.0 pg Final  . MCHC 12/04/2014 32.0  31.5 - 36.0 g/dL Final  . RBC 12/04/2014 3.76  3.70 - 5.45 10e6/uL Final  . RDW 12/04/2014 16.5* 11.2 - 14.5 % Final  . lymph# 12/04/2014 0.8* 0.9 - 3.3 10e3/uL Final  . MONO# 12/04/2014 2.0* 0.1 - 0.9 10e3/uL Final  . Eosinophils Absolute 12/04/2014 0.3  0.0 - 0.5 10e3/uL Final  . Basophils Absolute 12/04/2014 0.0  0.0 - 0.1 10e3/uL Final  . NEUT% 12/04/2014 51.8  38.4 - 76.8 % Final  . LYMPH% 12/04/2014 13.0* 14.0 - 49.7 % Final  . MONO% 12/04/2014 30.7* 0.0 - 14.0 % Final  . EOS% 12/04/2014 4.2  0.0 - 7.0 % Final  . BASO% 12/04/2014 0.3  0.0 - 2.0 % Final  . nRBC 12/04/2014 0  0 - 0 % Final  . Protime 12/04/2014 38.4* 10.6 - 13.4 Seconds Final  . INR 12/04/2014 3.20  2.00 - 3.50 Final   Comment: INR is useful only to assess adequacy of anticoagulation with coumadin when comparing results from different labs. It should not be used to estimate bleeding risk or presence/abscense of coagulopathy in patients not on coumadin. Expected INR ranges for  nontherapeutic patients is 0.88 - 1.12.   Marland Kitchen Lovenox 12/04/2014 No   Final     RADIOGRAPHIC STUDIES: No results found.  ASSESSMENT/PLAN:    Bronchitis C/o congested, productive cough with yellow secretions for past 6-7 days.  Denies any SOB, wheezing, or fever/chills. Pt stated that Zithromax does not work for her.  Pt with no cough or resp issues on exam. Prescribed levaquin for patient for mild bronchitis.   DVT (deep venous thrombosis), right D'x with a previous DVT and on chronic coumadin; which is managed per the  coumadin clinic.   Multiple myeloma Patient received cycle 11, day 11 of her Velcade injection today.  She has plans to return on 12/14/2014 for her next  Velcade injection; as well as her Zometa infusion.  Patient stated understanding of all instructions; and was in agreement with this plan of care. The patient knows to call the clinic with any problems, questions or concerns.   Review/collaboration with Dr. Jana Hakim regarding all aspects of patient's visit today.   Total time spent with patient was 15 minutes;  with greater than 75 percent of that time spent in face to face counseling regarding her symptoms and coordination of care and follow up.  Disclaimer: This note was dictated with voice recognition software. Similar sounding words can inadvertently be transcribed and may not be corrected upon review.   Drue Second, NP 12/07/2014

## 2014-12-07 NOTE — Telephone Encounter (Signed)
Called patient to follow up on her appt here at [facility] on Friday 12/04/14. Pt states she is "much better" since started antibiotics. Some cough, very little secretions at this point. Will call us if has any further problems. Has appt on 12/28

## 2014-12-07 NOTE — Assessment & Plan Note (Signed)
C/o congested, productive cough with yellow secretions for past 6-7 days.  Denies any SOB, wheezing, or fever/chills. Pt stated that Zithromax does not work for her.  Pt with no cough or resp issues on exam. Prescribed levaquin for patient for mild bronchitis.

## 2014-12-10 ENCOUNTER — Other Ambulatory Visit: Payer: Self-pay | Admitting: *Deleted

## 2014-12-10 DIAGNOSIS — C9000 Multiple myeloma not having achieved remission: Secondary | ICD-10-CM

## 2014-12-14 ENCOUNTER — Ambulatory Visit (HOSPITAL_BASED_OUTPATIENT_CLINIC_OR_DEPARTMENT_OTHER): Payer: Medicare Other

## 2014-12-14 ENCOUNTER — Other Ambulatory Visit (HOSPITAL_BASED_OUTPATIENT_CLINIC_OR_DEPARTMENT_OTHER): Payer: Medicare Other

## 2014-12-14 ENCOUNTER — Telehealth: Payer: Self-pay | Admitting: Oncology

## 2014-12-14 ENCOUNTER — Other Ambulatory Visit: Payer: Medicare Other

## 2014-12-14 ENCOUNTER — Other Ambulatory Visit: Payer: Self-pay | Admitting: *Deleted

## 2014-12-14 ENCOUNTER — Ambulatory Visit (HOSPITAL_BASED_OUTPATIENT_CLINIC_OR_DEPARTMENT_OTHER): Payer: Medicare Other | Admitting: Oncology

## 2014-12-14 ENCOUNTER — Ambulatory Visit: Payer: Medicare Other | Admitting: Pharmacist

## 2014-12-14 VITALS — BP 124/55 | HR 79 | Temp 98.1°F | Resp 18 | Ht 63.0 in | Wt 173.3 lb

## 2014-12-14 DIAGNOSIS — C9000 Multiple myeloma not having achieved remission: Secondary | ICD-10-CM

## 2014-12-14 DIAGNOSIS — E119 Type 2 diabetes mellitus without complications: Secondary | ICD-10-CM

## 2014-12-14 DIAGNOSIS — Z86711 Personal history of pulmonary embolism: Secondary | ICD-10-CM

## 2014-12-14 DIAGNOSIS — D689 Coagulation defect, unspecified: Secondary | ICD-10-CM

## 2014-12-14 DIAGNOSIS — D62 Acute posthemorrhagic anemia: Secondary | ICD-10-CM

## 2014-12-14 DIAGNOSIS — I82401 Acute embolism and thrombosis of unspecified deep veins of right lower extremity: Secondary | ICD-10-CM

## 2014-12-14 DIAGNOSIS — R21 Rash and other nonspecific skin eruption: Secondary | ICD-10-CM

## 2014-12-14 DIAGNOSIS — Z5112 Encounter for antineoplastic immunotherapy: Secondary | ICD-10-CM

## 2014-12-14 LAB — PROTIME-INR
INR: 2.2 (ref 2.00–3.50)
PROTIME: 26.4 s — AB (ref 10.6–13.4)

## 2014-12-14 LAB — CBC WITH DIFFERENTIAL/PLATELET
BASO%: 1.7 % (ref 0.0–2.0)
BASOS ABS: 0.1 10*3/uL (ref 0.0–0.1)
EOS ABS: 0.2 10*3/uL (ref 0.0–0.5)
EOS%: 2.9 % (ref 0.0–7.0)
HEMATOCRIT: 30.6 % — AB (ref 34.8–46.6)
HGB: 9.6 g/dL — ABNORMAL LOW (ref 11.6–15.9)
LYMPH%: 16 % (ref 14.0–49.7)
MCH: 26.4 pg (ref 25.1–34.0)
MCHC: 31.3 g/dL — ABNORMAL LOW (ref 31.5–36.0)
MCV: 84.2 fL (ref 79.5–101.0)
MONO#: 1 10*3/uL — AB (ref 0.1–0.9)
MONO%: 15.7 % — ABNORMAL HIGH (ref 0.0–14.0)
NEUT#: 3.9 10*3/uL (ref 1.5–6.5)
NEUT%: 63.7 % (ref 38.4–76.8)
PLATELETS: 355 10*3/uL (ref 145–400)
RBC: 3.63 10*6/uL — AB (ref 3.70–5.45)
RDW: 16.7 % — ABNORMAL HIGH (ref 11.2–14.5)
WBC: 6.1 10*3/uL (ref 3.9–10.3)
lymph#: 1 10*3/uL (ref 0.9–3.3)

## 2014-12-14 MED ORDER — ONDANSETRON HCL 8 MG PO TABS
ORAL_TABLET | ORAL | Status: AC
  Filled 2014-12-14: qty 1

## 2014-12-14 MED ORDER — ONDANSETRON HCL 8 MG PO TABS
8.0000 mg | ORAL_TABLET | Freq: Once | ORAL | Status: AC
  Administered 2014-12-14: 8 mg via ORAL

## 2014-12-14 MED ORDER — BORTEZOMIB CHEMO SQ INJECTION 3.5 MG (2.5MG/ML)
1.3000 mg/m2 | Freq: Once | INTRAMUSCULAR | Status: AC
  Administered 2014-12-14: 2.5 mg via SUBCUTANEOUS
  Filled 2014-12-14: qty 2.5

## 2014-12-14 NOTE — Telephone Encounter (Signed)
per Lattie Haw CC to sch pt CC-pt aware

## 2014-12-14 NOTE — Progress Notes (Signed)
INR = 2.2   Goal 2-3 INR within goal range. No complications of anticoagulation noted. Patient will take a break from Velcade after this week's tx. She will continue Revlimid and dexamethasone. She prefers to be seen weekly while on Coumadin. She states she is hoping to get off Coumadin after January. She will continue Coumadin  2.5mg  daily. We will recheck INR on 12/22/14; lab 9am, injection at 9:15, Coumadin clinic at 9:30.  Theone Murdoch, PharmD

## 2014-12-14 NOTE — Progress Notes (Signed)
[place]  Telephone:([phone]) [phone] Fax:(336) [phone]     ID: [name] OB: 1946/08/23  MR#: [id number]  EXN#:[id number]  PCP: Sue Reichmann, MD GYN:   SU:  OTHER MD: Sue Marble, MD;  Sue Coco, MD  CHIEF COMPLAINT: Multiple myeloma, under active treatment CURRENT TREATMENT: dexamethasone, bortezomib, lenalidomide zometa  MYELOMA HISTORY: From the original consult note, dated 03/24/2014:  "The patient has a history of PE following arthroscopic surgery,as well as LLE DVT requiring Coumadin therapy since October of 2014. On 4/7 she presented to the ED with a 2 day history of hemoptysis and mucous material. INR was elevated at 6 requiring FFP and Vit K with improvement of coagulopathy (INR 1.4 this morning). Anticoagulation is on hold until hemoptysis resolves.CT angiogram on 4/7 was negative for pulmonary embolus. Bilateral pneumonia was suspected, requiring IV antibiotics.  Interestingly, diffusely heterogeneous appearance to the visualized bones were seen, more notable than on a prior CT in 2010. Bone scan on 4/8 showed old fractures in posterior 8-9 th ribs, and in a lumbar XR a new lumbar spinal fracture was noted, not picked up by the bone scan. Given the mottled appearance of the CT films,workup for multiple myeloma was initiated: She had anemia in the setting of chronic disease, iron deficiency and blood loss, and mild renal insufficiency was seen with a Cr 1.62. Sodium was normal. Her Calcium however was 12.3 on admission, today at greater than 15 receiving Calcitonin 100U nasal spray, Zometa 4 mg and hydration IV. SPEP / UPEP / IFE are pending. "  Her subsequent history is as detailed below.  INTERVAL HISTORY: [name] returns today for follow up of her multiple myeloma, accompanied by her husband [name]. Today is day 1, cycle 12 of subcutaneous bortezomib which she receives on days 1, 4, 8, and 11 of each 21 day cycle. She also takes oral dexamethasone,  20 mg every Tuesday and Wednesday.  She has been on zoledronic acid, most recent dose 11/06/2014. Her lenalidomide was held mid-September but was resumed at 10 mg/d early October 2015. She receives that 21 days on, 7 days off every 28 day cycle. She is starting a cycle today  REVIEW OF SYSTEMS: [name] enjoyed Christmas, where she had her children over for supper period the evening before she spent at her niece's. When  She gets her bortezomib she also gets diarrhea, which lasts for about 2 days after each dose. She may have up to 4 loose bowel movements a day. She takes Imodium successfully for this. She is not aware of any side effects from the lenalidomide itself. Despite the steroids she tells me her sugars have been less than 140. She is not exercising, but when she gets tired she sits that and then she gets up and gets going again. She does have some back pain, which is not new or more intense. It "comes and goes". She has a little bit of a rash described below over both forearms in a little bit on her left cheek. There have been no fevers, bleeding, or other symptoms. She has rare headaches. A detailed review of systems today was otherwise stable  PAST MEDICAL HISTORY: Past Medical History  Diagnosis Date  . Neurofibromatosis   . HTN (hypertension)   . Obesity   . Hyperlipidemia     statin intolerant. LDL is 83  . UTI (lower urinary tract infection) 05/29/12  . Pulmonary embolism 09/17/2013  . STEMI (ST elevation myocardial infarction) 05/29/2012  Inferolateral STEMI ; s/p DES to RCA, nl EF  . Multiple myeloma     Dr. Jana Taylor   . DVT (deep venous thrombosis) 08/2014    RLE  . Type II diabetes mellitus   . History of blood transfusion ?2015    "blood count dropped real low"  . Osteoarthritis, knee   . Arthritis     "shoulders hurt" (09/30/2014)    PAST SURGICAL HISTORY: Past Surgical History  Procedure Laterality Date  . Clavicle surgery Left ~ 1960  . Ankle fracture surgery  Right ?1990's  . Total knee arthroplasty Left 09/15/2013    Procedure: TOTAL KNEE ARTHROPLASTY;  Surgeon: Sue Huger, MD;  Location: [facility];  Service: Orthopedics;  Laterality: Left;  . Hernia repair    . Coronary angioplasty with stent placement  05/2012    "1"  . Tonsillectomy and adenoidectomy  1950's  . Fracture surgery    . Bone marrow biopsy  03/2014    Dr. Jana Taylor   . Vaginal hysterectomy  1990's  . Left heart catheterization with coronary angiogram N/A 05/29/2012    Procedure: LEFT HEART CATHETERIZATION WITH CORONARY ANGIOGRAM;  Surgeon: Sue Booze, MD;  Location: [facility] CATH LAB;  Service: Cardiovascular;  Laterality: N/A;    FAMILY HISTORY Family History  Problem Relation Age of Onset  . Heart disease Mother   . Arthritis Mother   . Stroke Mother   . Heart disease Son     SOCIAL HISTORY:    (reviewed 05/19/2014)  Lives in [place] with husband of 51 years, [name]. 2 sons in good health.      ADVANCED DIRECTIVES:    HEALTH MAINTENANCE: (updated 05/19/2014)  History  Substance Use Topics  . Smoking status: Never Smoker   . Smokeless tobacco: Never Used  . Alcohol Use: No     Colonoscopy:  not on file   PAP: not on file   Bone density: not on file   Lipid panel: December 2014   Allergies  Allergen Reactions  . Codeine Nausea And Vomiting    Sees things  . Hydrocodone Nausea And Vomiting    Sees things  . Niaspan [Niacin Er] Nausea Only  . Robaxin [Methocarbamol] Other (See Comments)    Made patient feel funny  . Statins Nausea And Vomiting and Other (See Comments)    Myalgias/leg pain with atorvastatin, rosuvastatin, lovastatin, simvastatin  . Tetracycline Other (See Comments)    Pt doesn't remember reaction  . Zetia [Ezetimibe] Nausea And Vomiting  . Zithromax [Azithromycin] Nausea And Vomiting    Current Outpatient Prescriptions  Medication Sig Dispense Refill  . acetaminophen (TYLENOL) 325 MG tablet Take 1-2 tablets (325-650 mg total) by  mouth every 4 (four) hours as needed for mild pain.    . cetirizine (ZYRTEC) 10 MG tablet Take 10 mg by mouth daily as needed.     . clindamycin (CLINDAGEL) 1 % gel Apply topically 2 (two) times daily. 30 g 0  . dexamethasone (DECADRON) 4 MG tablet Take 5 tablets (20 mg total) by mouth as directed. 5 pills on Tuesday, Wednesday only 45 tablet 0  . erythromycin ophthalmic ointment   2  . glimepiride (AMARYL) 2 MG tablet Take 2 tablets by mouth every morning with breakfast.  On the days that you take prednisone or have chemotherapy take another dose at the evening meal 90 tablet 1  . glucose blood (ONE TOUCH ULTRA TEST) test strip 1 each by Other route 3 (three) times daily. 100 each 12  .  Lancets MISC Check glucose 3 times daily 100 each 6  . lenalidomide (REVLIMID) 10 MG capsule Take 1 capsule (10 mg total) by mouth daily. 21 capsule 0  . levofloxacin (LEVAQUIN) 500 MG tablet Take 1 tablet (500 mg total) by mouth daily. 7 tablet 0  . metoprolol tartrate (LOPRESSOR) 25 MG tablet Take 25 mg by mouth 2 (two) times daily.    . Multiple Vitamin (MULTIVITAMIN WITH MINERALS) TABS tablet Take 1 tablet by mouth at bedtime. Centrum Silver    . Multiple Vitamins-Minerals (OCUVITE PO) Take 1 tablet by mouth daily.     Marland Kitchen NITROSTAT 0.4 MG SL tablet DISSOLVE 1 TABLET UNDER THE TONGUE EVERY 5 MINUTES AS NEEDED FOR CHEST PAIN. UP TO 3 DOSES 25 tablet 0  . nystatin (MYCOSTATIN/NYSTOP) 100000 UNIT/GM POWD Apply 1 g topically 3 (three) times daily as needed. 30 g 1  . polyethylene glycol (MIRALAX / GLYCOLAX) packet Take 17 g by mouth daily.    . prochlorperazine (COMPAZINE) 10 MG tablet Take 1 tablet (10 mg total) by mouth every 6 (six) hours as needed (Nausea or vomiting). 90 tablet 4  . promethazine (PHENERGAN) 50 MG tablet   0  . traMADol (ULTRAM) 50 MG tablet Take 1 tablet (50 mg total) by mouth every 6 (six) hours as needed for moderate pain. 30 tablet 2  . warfarin (COUMADIN) 5 MG tablet Take 1 tablet (5 mg  total) by mouth daily. 30 tablet 3   No current facility-administered medications for this visit.    OBJECTIVE: Middle-aged white woman  Who appears stated age 64 Vitals:   12/14/14 1221  BP: 124/55  Pulse: 79  Temp: 98.1 F (36.7 C)  Resp: 18     Body mass index is 30.71 kg/(m^2).    ECOG FS:2 - Symptomatic, <50% confined to bed Filed Weights   12/14/14 1221  Weight: 173 lb 4.8 oz (78.608 kg)   Sclerae unicteric,  Pupils round and equal Oropharynx clear and moist  No cervical or supraclavicular adenopathy Lungs no rales or rhonchi Heart regular rate and rhythm Abd soft, obese, nontender, positive bowel sounds MSK no focal spinal tenderness Neuro: nonfocal, well oriented,  pleasant Breasts: Deferred Sin: There is a nonpalpable erythematouse  Dorsal aspect of both forearms, non-confluent, there is a similar spot on her left cheek. These look very much like mild bruises. However she is not aware of trauma to any of these areas    LAB RESULTS:  Lab Results  Component Value Date   WBC 6.1 12/14/2014   NEUTROABS 3.9 12/14/2014   HGB 9.6* 12/14/2014   HCT 30.6* 12/14/2014   MCV 84.2 12/14/2014   PLT 355 12/14/2014      Chemistry      Component Value Date/Time   NA 138 12/01/2014 1307   NA 140 11/03/2014 1326   K 4.6 12/01/2014 1307   K 4.7 11/03/2014 1326   CL 100 11/03/2014 1326   CO2 22 12/01/2014 1307   CO2 25 11/03/2014 1326   BUN 13.9 12/01/2014 1307   BUN 20 11/03/2014 1326   CREATININE 1.0 12/01/2014 1307   CREATININE 0.89 11/03/2014 1326   CREATININE 0.81 12/09/2013 1000   GLU 173 11/08/2010      Component Value Date/Time   CALCIUM 8.2* 12/01/2014 1307   CALCIUM 9.2 11/03/2014 1326   CALCIUM 11.1* 03/25/2014 0850   ALKPHOS 55 12/01/2014 1307   ALKPHOS 59 11/03/2014 1326   AST 18 12/01/2014 1307   AST 22 11/03/2014 1326  ALT 25 12/01/2014 1307   ALT 24 11/03/2014 1326   BILITOT 0.67 12/01/2014 1307   BILITOT 0.6 11/03/2014 1326       STUDIES: No results found.  ASSESSMENT: 68 [place] presenting with hypercalcemia, anemia and mottled bone lesions April 2015, M-spike 3.82 g, IFE showing IgA/kappa myeloma with bone marrow biopsy 03/30/2014 with 88% plasma cells, FISH  t(4,14), deletion 13 and deletion 11 (intermediate risk); initial beta-2 microglobulin 16.  (1) considered E1A11 study, but unable to tolerate anticoagulation  (2) associated issues:  (a) coagulopathy: history of post-op PE; history of bleeding with warfarin; epistaxis with  ASA 325 ms/ day; switched to 81 mg/ day with fair tolerance  (c) hypercalcemia: started zolendronate 03/26/2014, to be repeated monthly  (d) pain: compression fracture L1, rib fractures: on tylenol and tramadol  (e) immunocompromise: on Septra prophylaxis, valacyclovir and acyclovir both stopped because of uncontrolled diarrhea  (3) started dexamethasone 40 mg/ week (20 mg po on Tues and Weds April 1594); complicated by hyperglycemia with history of diabetes, followed by [name]  (4) started bortezomib SQ 04/28/2014, to be given days 1,4, 8 and 11 of ech 21 day cycle  (5) started lenalidomide first week in September 2015, held after 1 cycle with poor tolerance; resumed 09/30/2014 at 10 mg daily, days 1 through 21 of each 28 day cycle  PLAN: [name] to be in plateau as far as her M spike is concerned. Of the 3 drugs she is on the one that is most difficult for her is the bortezomib. Quite aside from having to come here twice a week to receive treatment it also causes her diarrhea. After this cycle  The plan will be to stop the bortezomib, but continue the lenalidomide and dexamethasone. If after 2 months there has been no progression we'll consider stopping the dexamethasone. At that point I would feel comfortable stopping her Coumadin as well.   she is going to see me again January 15 and we will repeat her lab work a few days before. She is managing her loose  bowel movements appropriately. She tells me her sugars are under good control. As far as the rash is concerned she is going to use an antibiotic cream that she already has available, but again these look like bruises not likely candidal or other dermatophytosis. I am not sure that the list of medications we have for her is up-to-date. She really cannot remember what she is and is not taking. Accordingly I asked her to put all her medicines in the back when she returns later this week and we will update her medication list at that point.  [name] has a good understanding of this plan. She agrees with it. She knows the goal of treatment in her case is control. She will call with any problems that may develop before her next visit here.       Sue Cruel, MD   12/14/2014 12:31 PM

## 2014-12-14 NOTE — Telephone Encounter (Signed)
per pof to sch pt appt-per NVakl ok to move inj to Tue & Fri-gave pt copy of sch

## 2014-12-14 NOTE — Patient Instructions (Signed)
[facility] Discharge Instructions for Patients Receiving Chemotherapy  Today you received the following chemotherapy agents: Velcade. To help prevent nausea and vomiting after your treatment, we encourage you to take your nausea medication.  If you develop nausea and vomiting that is not controlled by your nausea medication, call the clinic.   BELOW ARE SYMPTOMS THAT SHOULD BE REPORTED IMMEDIATELY:  *FEVER GREATER THAN 100.5 F  *CHILLS WITH OR WITHOUT FEVER  NAUSEA AND VOMITING THAT IS NOT CONTROLLED WITH YOUR NAUSEA MEDICATION  *UNUSUAL SHORTNESS OF BREATH  *UNUSUAL BRUISING OR BLEEDING  TENDERNESS IN MOUTH AND THROAT WITH OR WITHOUT PRESENCE OF ULCERS  *URINARY PROBLEMS  *BOWEL PROBLEMS  UNUSUAL RASH Items with * indicate a potential emergency and should be followed up as soon as possible.  Feel free to call the clinic you have any questions or concerns. The clinic phone number is [phone].    

## 2014-12-16 ENCOUNTER — Other Ambulatory Visit: Payer: Self-pay | Admitting: *Deleted

## 2014-12-16 DIAGNOSIS — Z7901 Long term (current) use of anticoagulants: Secondary | ICD-10-CM

## 2014-12-16 DIAGNOSIS — C9000 Multiple myeloma not having achieved remission: Secondary | ICD-10-CM

## 2014-12-17 ENCOUNTER — Other Ambulatory Visit (HOSPITAL_BASED_OUTPATIENT_CLINIC_OR_DEPARTMENT_OTHER): Payer: Medicare Other

## 2014-12-17 ENCOUNTER — Ambulatory Visit (HOSPITAL_BASED_OUTPATIENT_CLINIC_OR_DEPARTMENT_OTHER): Payer: Self-pay

## 2014-12-17 ENCOUNTER — Ambulatory Visit (HOSPITAL_BASED_OUTPATIENT_CLINIC_OR_DEPARTMENT_OTHER): Payer: Medicare Other

## 2014-12-17 DIAGNOSIS — C9000 Multiple myeloma not having achieved remission: Secondary | ICD-10-CM

## 2014-12-17 DIAGNOSIS — Z7901 Long term (current) use of anticoagulants: Secondary | ICD-10-CM

## 2014-12-17 DIAGNOSIS — Z86711 Personal history of pulmonary embolism: Secondary | ICD-10-CM

## 2014-12-17 DIAGNOSIS — Z5112 Encounter for antineoplastic immunotherapy: Secondary | ICD-10-CM

## 2014-12-17 DIAGNOSIS — I82401 Acute embolism and thrombosis of unspecified deep veins of right lower extremity: Secondary | ICD-10-CM

## 2014-12-17 LAB — COMPREHENSIVE METABOLIC PANEL (CC13)
ALT: 14 U/L (ref 0–55)
AST: 15 U/L (ref 5–34)
Albumin: 2.7 g/dL — ABNORMAL LOW (ref 3.5–5.0)
Alkaline Phosphatase: 45 U/L (ref 40–150)
Anion Gap: 10 mEq/L (ref 3–11)
BILIRUBIN TOTAL: 0.31 mg/dL (ref 0.20–1.20)
BUN: 13.6 mg/dL (ref 7.0–26.0)
CALCIUM: 8.4 mg/dL (ref 8.4–10.4)
CHLORIDE: 105 meq/L (ref 98–109)
CO2: 24 meq/L (ref 22–29)
Creatinine: 1 mg/dL (ref 0.6–1.1)
EGFR: 62 mL/min/{1.73_m2} — AB (ref >90)
Glucose: 180 mg/dl — ABNORMAL HIGH (ref 70–140)
Potassium: 3.7 mEq/L (ref 3.5–5.1)
Sodium: 140 mEq/L (ref 136–145)
Total Protein: 7.1 g/dL (ref 6.4–8.3)

## 2014-12-17 LAB — CBC WITH DIFFERENTIAL/PLATELET
BASO%: 0.7 % (ref 0.0–2.0)
Basophils Absolute: 0 10*3/uL (ref 0.0–0.1)
EOS%: 0.5 % (ref 0.0–7.0)
Eosinophils Absolute: 0 10*3/uL (ref 0.0–0.5)
HCT: 30.6 % — ABNORMAL LOW (ref 34.8–46.6)
HGB: 9.4 g/dL — ABNORMAL LOW (ref 11.6–15.9)
LYMPH%: 11 % — AB (ref 14.0–49.7)
MCH: 26.2 pg (ref 25.1–34.0)
MCHC: 30.8 g/dL — AB (ref 31.5–36.0)
MCV: 85 fL (ref 79.5–101.0)
MONO#: 2 10*3/uL — ABNORMAL HIGH (ref 0.1–0.9)
MONO%: 27.8 % — AB (ref 0.0–14.0)
NEUT#: 4.2 10*3/uL (ref 1.5–6.5)
NEUT%: 60 % (ref 38.4–76.8)
PLATELETS: 352 10*3/uL (ref 145–400)
RBC: 3.6 10*6/uL — ABNORMAL LOW (ref 3.70–5.45)
RDW: 17 % — ABNORMAL HIGH (ref 11.2–14.5)
WBC: 7 10*3/uL (ref 3.9–10.3)
lymph#: 0.8 10*3/uL — ABNORMAL LOW (ref 0.9–3.3)

## 2014-12-17 LAB — PROTIME-INR
INR: 2 (ref 2.00–3.50)
Protime: 24 Seconds — ABNORMAL HIGH (ref 10.6–13.4)

## 2014-12-17 LAB — POCT INR: INR: 2

## 2014-12-17 MED ORDER — ONDANSETRON HCL 8 MG PO TABS
ORAL_TABLET | ORAL | Status: AC
  Filled 2014-12-17: qty 1

## 2014-12-17 MED ORDER — BORTEZOMIB CHEMO SQ INJECTION 3.5 MG (2.5MG/ML)
1.3000 mg/m2 | Freq: Once | INTRAMUSCULAR | Status: AC
  Administered 2014-12-17: 2.5 mg via SUBCUTANEOUS
  Filled 2014-12-17: qty 2.5

## 2014-12-17 MED ORDER — ONDANSETRON HCL 8 MG PO TABS
8.0000 mg | ORAL_TABLET | Freq: Once | ORAL | Status: AC
  Administered 2014-12-17: 8 mg via ORAL

## 2014-12-17 NOTE — Progress Notes (Signed)
*  No charge encounter - patient seen during infusion*  Ms. Knoop's INR is 2 today which is within her goal range of 2-3. She has been taking Coumadin 2.5 mg every day. We just spoke with her on 12/28 (INR 2.2 at that time), though since her INR was drawn again with her labs today, we spoke with her to ensure all is going well with her Coumadin therapy. She denies any extra and/or missed doses, any medication and/or dietary changes, as well as any bleeding and/or unusual bruising. She reports no issues with her anticoagulation therapy at this time.  Plan: No changes to Coumadin therapy - continue 2.5 mg daily We already had her scheduled for 12/22/14 at 9:45 am, and we will see her then.

## 2014-12-17 NOTE — Patient Instructions (Signed)
[facility] Discharge Instructions for Patients Receiving Chemotherapy  Today you received the following chemotherapy agents velcade   To help prevent nausea and vomiting after your treatment, we encourage you to take your nausea medication as directed  If you develop nausea and vomiting that is not controlled by your nausea medication, call the clinic.   BELOW ARE SYMPTOMS THAT SHOULD BE REPORTED IMMEDIATELY:  *FEVER GREATER THAN 100.5 F  *CHILLS WITH OR WITHOUT FEVER  NAUSEA AND VOMITING THAT IS NOT CONTROLLED WITH YOUR NAUSEA MEDICATION  *UNUSUAL SHORTNESS OF BREATH  *UNUSUAL BRUISING OR BLEEDING  TENDERNESS IN MOUTH AND THROAT WITH OR WITHOUT PRESENCE OF ULCERS  *URINARY PROBLEMS  *BOWEL PROBLEMS  UNUSUAL RASH Items with * indicate a potential emergency and should be followed up as soon as possible.  Feel free to call the clinic you have any questions or concerns. The clinic phone number is [phone].  

## 2014-12-21 ENCOUNTER — Other Ambulatory Visit: Payer: Self-pay | Admitting: *Deleted

## 2014-12-22 ENCOUNTER — Ambulatory Visit (HOSPITAL_BASED_OUTPATIENT_CLINIC_OR_DEPARTMENT_OTHER): Payer: Medicare Other

## 2014-12-22 ENCOUNTER — Telehealth: Payer: Self-pay | Admitting: Oncology

## 2014-12-22 ENCOUNTER — Other Ambulatory Visit (HOSPITAL_BASED_OUTPATIENT_CLINIC_OR_DEPARTMENT_OTHER): Payer: Medicare Other

## 2014-12-22 ENCOUNTER — Ambulatory Visit (HOSPITAL_BASED_OUTPATIENT_CLINIC_OR_DEPARTMENT_OTHER): Payer: Self-pay | Admitting: Pharmacist

## 2014-12-22 DIAGNOSIS — Z5112 Encounter for antineoplastic immunotherapy: Secondary | ICD-10-CM

## 2014-12-22 DIAGNOSIS — I82401 Acute embolism and thrombosis of unspecified deep veins of right lower extremity: Secondary | ICD-10-CM

## 2014-12-22 DIAGNOSIS — C9000 Multiple myeloma not having achieved remission: Secondary | ICD-10-CM

## 2014-12-22 LAB — PROTIME-INR
INR: 1.7 — ABNORMAL LOW (ref 2.00–3.50)
PROTIME: 20.4 s — AB (ref 10.6–13.4)

## 2014-12-22 LAB — POCT INR: INR: 1.7

## 2014-12-22 MED ORDER — ONDANSETRON HCL 8 MG PO TABS
ORAL_TABLET | ORAL | Status: AC
  Filled 2014-12-22: qty 1

## 2014-12-22 MED ORDER — ONDANSETRON HCL 8 MG PO TABS
8.0000 mg | ORAL_TABLET | Freq: Once | ORAL | Status: AC
  Administered 2014-12-22: 8 mg via ORAL

## 2014-12-22 MED ORDER — BORTEZOMIB CHEMO SQ INJECTION 3.5 MG (2.5MG/ML)
1.3000 mg/m2 | Freq: Once | INTRAMUSCULAR | Status: AC
  Administered 2014-12-22: 2.5 mg via SUBCUTANEOUS
  Filled 2014-12-22: qty 2.5

## 2014-12-22 NOTE — Progress Notes (Signed)
Pt request to wait until Friday 1/8 to get zometa.  [place] per pharmacy.

## 2014-12-22 NOTE — Progress Notes (Signed)
INR below goal today Pt seen in infusion area [name] is feeling weak/fatigued and states "just feel worn out" No missed or extra doses Minor blood in stool over weekend but pt states this was minor. Will continue to monitor No other complaints No medication or diet changes Due to fatigue and decreased appetite will only boost [name] today and not adjust dose. May need dose increase next week if INR still subtherapeutic.  Plan: Take 5 mg today only then continue Coumadin  2.5mg  daily. Recheck INR on 12/29/14; lab 10am,Coumadin clinic at 10:15am.

## 2014-12-22 NOTE — Patient Instructions (Addendum)
INR below goal Take 5 mg today only then continue Coumadin  2.5mg  daily. Recheck INR on 12/29/14; lab 10am,Coumadin clinic at 10:15am.

## 2014-12-22 NOTE — Telephone Encounter (Signed)
per [facility] in [place] pt in inf getting inj velcade-MW r/s correct location

## 2014-12-22 NOTE — Patient Instructions (Signed)
[facility] Discharge Instructions for Patients Receiving Chemotherapy  Today you received the following chemotherapy agents velcade   To help prevent nausea and vomiting after your treatment, we encourage you to take your nausea medication as directed  If you develop nausea and vomiting that is not controlled by your nausea medication, call the clinic.   BELOW ARE SYMPTOMS THAT SHOULD BE REPORTED IMMEDIATELY:  *FEVER GREATER THAN 100.5 F  *CHILLS WITH OR WITHOUT FEVER  NAUSEA AND VOMITING THAT IS NOT CONTROLLED WITH YOUR NAUSEA MEDICATION  *UNUSUAL SHORTNESS OF BREATH  *UNUSUAL BRUISING OR BLEEDING  TENDERNESS IN MOUTH AND THROAT WITH OR WITHOUT PRESENCE OF ULCERS  *URINARY PROBLEMS  *BOWEL PROBLEMS  UNUSUAL RASH Items with * indicate a potential emergency and should be followed up as soon as possible.  Feel free to call the clinic you have any questions or concerns. The clinic phone number is [phone].  

## 2014-12-23 ENCOUNTER — Other Ambulatory Visit: Payer: Self-pay | Admitting: *Deleted

## 2014-12-23 ENCOUNTER — Telehealth: Payer: Self-pay | Admitting: Oncology

## 2014-12-23 MED ORDER — LENALIDOMIDE 10 MG PO CAPS: 10.0000 mg | ORAL_CAPSULE | Freq: Every day | ORAL | Status: DC

## 2014-12-23 NOTE — Telephone Encounter (Signed)
per Gerald Stabs to sch CC-sch-pt aware

## 2014-12-24 LAB — KAPPA/LAMBDA LIGHT CHAINS
Kappa free light chain: 48.7 mg/dL — ABNORMAL HIGH (ref 0.33–1.94)
Lambda Free Lght Chn: <0.03 mg/dL — ABNORMAL LOW (ref 0.57–2.63)

## 2014-12-24 LAB — PROTEIN ELECTROPHORESIS, SERUM
ALPHA-1-GLOBULIN: 4.6 % (ref 2.9–4.9)
ALPHA-2-GLOBULIN: 8.9 % (ref 7.1–11.8)
Albumin ELP: 38.5 % — ABNORMAL LOW (ref 55.8–66.1)
BETA 2: 40.6 % — AB (ref 3.2–6.5)
Beta Globulin: 5.8 % (ref 4.7–7.2)
GAMMA GLOBULIN: 1.6 % — AB (ref 11.1–18.8)
M-SPIKE, %: 0.36 g/dL
Total Protein, Serum Electrophoresis: 7.8 g/dL (ref 6.0–8.3)

## 2014-12-24 LAB — IGG, IGA, IGM
IGA: 2650 mg/dL — AB (ref 69–380)
IgG (Immunoglobin G), Serum: 174 mg/dL — ABNORMAL LOW (ref 690–1700)

## 2014-12-25 ENCOUNTER — Ambulatory Visit (HOSPITAL_BASED_OUTPATIENT_CLINIC_OR_DEPARTMENT_OTHER): Payer: Medicare Other

## 2014-12-25 ENCOUNTER — Encounter: Payer: Self-pay | Admitting: Nurse Practitioner

## 2014-12-25 ENCOUNTER — Ambulatory Visit (HOSPITAL_BASED_OUTPATIENT_CLINIC_OR_DEPARTMENT_OTHER): Payer: Medicare Other | Admitting: Nurse Practitioner

## 2014-12-25 DIAGNOSIS — I82401 Acute embolism and thrombosis of unspecified deep veins of right lower extremity: Secondary | ICD-10-CM

## 2014-12-25 DIAGNOSIS — R042 Hemoptysis: Secondary | ICD-10-CM

## 2014-12-25 DIAGNOSIS — C9000 Multiple myeloma not having achieved remission: Secondary | ICD-10-CM

## 2014-12-25 DIAGNOSIS — Z5112 Encounter for antineoplastic immunotherapy: Secondary | ICD-10-CM

## 2014-12-25 DIAGNOSIS — Z86718 Personal history of other venous thrombosis and embolism: Secondary | ICD-10-CM

## 2014-12-25 DIAGNOSIS — C9002 Multiple myeloma in relapse: Secondary | ICD-10-CM

## 2014-12-25 MED ORDER — BORTEZOMIB CHEMO SQ INJECTION 3.5 MG (2.5MG/ML)
1.3000 mg/m2 | Freq: Once | INTRAMUSCULAR | Status: AC
  Administered 2014-12-25: 2.5 mg via SUBCUTANEOUS
  Filled 2014-12-25: qty 2.5

## 2014-12-25 MED ORDER — ZOLEDRONIC ACID 4 MG/5ML IV CONC
3.5000 mg | Freq: Once | INTRAVENOUS | Status: AC
  Administered 2014-12-25: 3.5 mg via INTRAVENOUS
  Filled 2014-12-25: qty 4.38

## 2014-12-25 MED ORDER — ONDANSETRON HCL 8 MG PO TABS
8.0000 mg | ORAL_TABLET | Freq: Once | ORAL | Status: AC
  Administered 2014-12-25: 8 mg via ORAL

## 2014-12-25 MED ORDER — ONDANSETRON HCL 8 MG PO TABS
ORAL_TABLET | ORAL | Status: AC
  Filled 2014-12-25: qty 1

## 2014-12-25 NOTE — Assessment & Plan Note (Signed)
Patient reports having a chronic cough with occasional dark blood hemoptysis as her baseline.  She reports having a coughing spasm this morning; and noted a trace of bright red blood in her secretions.  She denies any worsening cough whatsoever.  She denies any increased production with her cough.  She denies any worsening dyspnea.  She denies any recent fevers or chills.  Most likely, patient's one episode of hemoptysis with bright red blood was due to throat irritation secondary to a coughing spasm.  Advised both patient and her husband to call/return or go directly to the emergency department over this weekend if she develops any worsening symptoms whatsoever.

## 2014-12-25 NOTE — Assessment & Plan Note (Signed)
Patient has a history of pulmonary embolism; and is on chronic Coumadin which is managed per the Coumadin clinic.  Most recent INR was 1.7; and the Coumadin clinic did increase patient's Coumadin 1 day prior to her returning to her baseline dose of 2.5 mg per day.

## 2014-12-25 NOTE — Progress Notes (Signed)
will   SYMPTOM MANAGEMENT CLINIC   HPI: [name] 69 y.o. female diagnosed with multiple myeloma.  Currently undergoing Velcade/Revlimid/dexamethasone/Zometa therapy.  Patient presented to the [facility] today to receive cycle 12, day 11 of her Velcade injection.  She will also receive a Zometa infusion today as well.  She continues take the Revlimid in the dexamethasone orally as previously directed.  The decision has been made recently for patient to hold any further cycles of Velcade injections due to chronic issues with diarrhea.  Patient reports having a chronic cough with occasional dark blood hemoptysis.  Patient reports having a coughing spasm earlier this morning; and noted some bright red blood hemoptysis.  She denies any worsening cough whatsoever.  She denies any increased productive secretions or dyspnea.  She denies any recent fevers or chills.  HPI  CURRENT THERAPY: Upcoming Treatment Dates - MYELOMA INDUCTION NON-TRANSPLANT CANDIDATE Bortezomib SQ / Dexamethasone q21d Days with orders from any treatment category:  01/04/2015      SCHEDULING COMMUNICATION      ondansetron (ZOFRAN) tablet 8 mg      bortezomib SQ (VELCADE) chemo injection 2.5 mg      TREATMENT CONDITIONS      [facility] NURSING COMMUNICATION 01/07/2015      SCHEDULING COMMUNICATION      ondansetron (ZOFRAN) tablet 8 mg      bortezomib SQ (VELCADE) chemo injection 2.5 mg      TREATMENT CONDITIONS      [facility]URSING COMMUNICATION 01/11/2015      SCHEDULING COMMUNICATION      ondansetron (ZOFRAN) tablet 8 mg      bortezomib SQ (VELCADE) chemo injection 2.5 mg      TREATMENT CONDITIONS    ROS  Past Medical History  Diagnosis Date  . Neurofibromatosis   . HTN (hypertension)   . Obesity   . Hyperlipidemia     statin intolerant. LDL is 83  . UTI (lower urinary tract infection) 05/29/12  . Pulmonary embolism 09/17/2013  . STEMI (ST elevation myocardial infarction) 05/29/2012    Inferolateral  STEMI ; s/p DES to RCA, nl EF  . Multiple myeloma     Dr. Jana Hakim   . DVT (deep venous thrombosis) 08/2014    RLE  . Type II diabetes mellitus   . History of blood transfusion ?2015    "blood count dropped real low"  . Osteoarthritis, knee   . Arthritis     "shoulders hurt" (09/30/2014)    Past Surgical History  Procedure Laterality Date  . Clavicle surgery Left ~ 1960  . Ankle fracture surgery Right ?1990's  . Total knee arthroplasty Left 09/15/2013    Procedure: TOTAL KNEE ARTHROPLASTY;  Surgeon: Vickey Huger, MD;  Location: [place];  Service: Orthopedics;  Laterality: Left;  . Hernia repair    . Coronary angioplasty with stent placement  05/2012    "1"  . Tonsillectomy and adenoidectomy  1950's  . Fracture surgery    . Bone marrow biopsy  03/2014    Dr. Jana Hakim   . Vaginal hysterectomy  1990's  . Left heart catheterization with coronary angiogram N/A 05/29/2012    Procedure: LEFT HEART CATHETERIZATION WITH CORONARY ANGIOGRAM;  Surgeon: Jettie Booze, MD;  Location: [facility] CATH LAB;  Service: Cardiovascular;  Laterality: N/A;    has Neurofibromatosis; Type II or unspecified type diabetes mellitus without mention of complication, uncontrolled; HTN (hypertension); Obesity; Osteoarthritis, knee; Fatigue; Chest pain at rest; Acute sinusitis, unspecified; Herpes zoster; Dyspnea; STEMI (ST  elevation myocardial infarction); Benign hypertensive heart disease without heart failure; Onychomycosis; Pain in joint, ankle and foot; Acute pulmonary embolism; Hypoxemia; Pulmonary embolism; Long term current use of anticoagulant therapy; Fe deficiency anemia; Encounter for therapeutic drug monitoring; Pneumonia, organism unspecified; Hypercalcemia; Major hemoptysis; Acute blood loss anemia; Vertebral fracture, closed; Physical deconditioning; Multiple myeloma in relapse; Epistaxis; Leg pain, left; Pain in lower limb; Dysuria; Labial irritation; DVT (deep venous thrombosis), right; Diarrhea; Pulmonary  hemorrhage; Hemoptysis; Diabetes mellitus type 2, controlled; Rash; Bronchitis; and Multiple myeloma on her problem list.     is allergic to codeine; hydrocodone; niaspan; robaxin; statins; tetracycline; zetia; and zithromax.    Medication List       This list is accurate as of: 12/25/14  6:57 PM.  Always use your most recent med list.               acetaminophen 325 MG tablet  Commonly known as:  TYLENOL  Take 1-2 tablets (325-650 mg total) by mouth every 4 (four) hours as needed for mild pain.     cetirizine 10 MG tablet  Commonly known as:  ZYRTEC  Take 10 mg by mouth daily as needed.     clindamycin 1 % gel  Commonly known as:  CLINDAGEL  Apply topically 2 (two) times daily.     dexamethasone 4 MG tablet  Commonly known as:  DECADRON  Take 5 tablets (20 mg total) by mouth as directed. 5 pills on Tuesday, Wednesday only     erythromycin ophthalmic ointment     glimepiride 2 MG tablet  Commonly known as:  AMARYL  - Take 2 tablets by mouth every morning with breakfast.   - On the days that you take prednisone or have chemotherapy take another dose at the evening meal     glucose blood test strip  Commonly known as:  ONE TOUCH ULTRA TEST  1 each by Other route 3 (three) times daily.     Lancets Misc  Check glucose 3 times daily     lenalidomide 10 MG capsule  Commonly known as:  REVLIMID  Take 1 capsule (10 mg total) by mouth daily.     metoprolol tartrate 25 MG tablet  Commonly known as:  LOPRESSOR  Take 25 mg by mouth 2 (two) times daily.     multivitamin with minerals Tabs tablet  Take 1 tablet by mouth at bedtime. Centrum Silver     NITROSTAT 0.4 MG SL tablet  Generic drug:  nitroGLYCERIN  DISSOLVE 1 TABLET UNDER THE TONGUE EVERY 5 MINUTES AS NEEDED FOR CHEST PAIN. UP TO 3 DOSES     nystatin 100000 UNIT/GM Powd  Apply 1 g topically 3 (three) times daily as needed.     OCUVITE PO  Take 1 tablet by mouth daily.     polyethylene glycol packet    Commonly known as:  MIRALAX / GLYCOLAX  Take 17 g by mouth daily.     prochlorperazine 10 MG tablet  Commonly known as:  COMPAZINE  Take 1 tablet (10 mg total) by mouth every 6 (six) hours as needed (Nausea or vomiting).     promethazine 50 MG tablet  Commonly known as:  PHENERGAN     traMADol 50 MG tablet  Commonly known as:  ULTRAM  Take 1 tablet (50 mg total) by mouth every 6 (six) hours as needed for moderate pain.     warfarin 5 MG tablet  Commonly known as:  COUMADIN  Take 1 tablet (5 mg  total) by mouth daily.         PHYSICAL EXAMINATION  Vitals: 120/70, 75, 98.5  Physical Exam  Constitutional: She is oriented to person, place, and time. Vital signs are normal. She appears unhealthy.  HENT:  Head: Normocephalic and atraumatic.  Eyes: Conjunctivae and EOM are normal. Pupils are equal, round, and reactive to light.  Neck: Normal range of motion.  Pulmonary/Chest: Effort normal. No respiratory distress.  Patient brought in a tissue today with some secretions she had coughed up earlier this morning.  Thin mucus noted with trace bright red blood.  Musculoskeletal: Normal range of motion.  Neurological: She is alert and oriented to person, place, and time.  Psychiatric: Affect normal.  Nursing note and vitals reviewed.   LABORATORY DATA:. No visits with results within 3 Day(s) from this visit. Latest known visit with results is:  Anti-coag visit on 12/22/2014  Component Date Value Ref Range Status  . INR 12/22/2014 1.7   Final     RADIOGRAPHIC STUDIES: No results found.  ASSESSMENT/PLAN:    DVT (deep venous thrombosis), right Patient has a history of pulmonary embolism; and is on chronic Coumadin which is managed per the Coumadin clinic.  Most recent INR was 1.7; and the Coumadin clinic did increase patient's Coumadin 1 day prior to her returning to her baseline dose of 2.5 mg per day.  Hemoptysis Patient reports having a chronic cough with occasional dark  blood hemoptysis as her baseline.  She reports having a coughing spasm this morning; and noted a trace of bright red blood in her secretions.  She denies any worsening cough whatsoever.  She denies any increased production with her cough.  She denies any worsening dyspnea.  She denies any recent fevers or chills.  Most likely, patient's one episode of hemoptysis with bright red blood was due to throat irritation secondary to a coughing spasm.  Advised both patient and her husband to call/return or go directly to the emergency department over this weekend if she develops any worsening symptoms whatsoever.  Multiple myeloma in relapse Patient received cycle 12, day 11 of her Velcade injection today.  She also received her monthly Zometa infusion today as well.  She continues to take both Revlimid orally and dexamethasone as previously directed.  The plan is for the patient to discontinue any further Velcade injections due to chronic issues with diarrhea secondary to the Velcade.  She will continue to take the Revlimid and dexamethasone as previously directed for the time being.  Patient has plans to follow back up with labs and a Coumadin clinic visit on 12/29/2014.  She plans to return for follow-up visit here at the [facility] on 01/01/2015.  Patient stated understanding of all instructions; and was in agreement with this plan of care. The patient knows to call the clinic with any problems, questions or concerns.   Review/collaboration with Dr. Jana Hakim regarding all aspects of patient's visit today.   Total time spent with patient was 15 minutes;  with greater than 80 percent of that time spent in face to face counseling regarding her symptoms, and coordination of care and follow up.  Disclaimer: This note was dictated with voice recognition software. Similar sounding words can inadvertently be transcribed and may not be corrected upon review.   Drue Second, NP 12/25/2014

## 2014-12-25 NOTE — Patient Instructions (Signed)
[facility] Discharge Instructions for Patients Receiving Chemotherapy  Today you received the following chemotherapy agents: Velcade.  To help prevent nausea and vomiting after your treatment, we encourage you to take your nausea medication as prescribed.   If you develop nausea and vomiting that is not controlled by your nausea medication, call the clinic.   BELOW ARE SYMPTOMS THAT SHOULD BE REPORTED IMMEDIATELY:  *FEVER GREATER THAN 100.5 F  *CHILLS WITH OR WITHOUT FEVER  NAUSEA AND VOMITING THAT IS NOT CONTROLLED WITH YOUR NAUSEA MEDICATION  *UNUSUAL SHORTNESS OF BREATH  *UNUSUAL BRUISING OR BLEEDING  TENDERNESS IN MOUTH AND THROAT WITH OR WITHOUT PRESENCE OF ULCERS  *URINARY PROBLEMS  *BOWEL PROBLEMS  UNUSUAL RASH Items with * indicate a potential emergency and should be followed up as soon as possible.  Feel free to call the clinic you have any questions or concerns. The clinic phone number is [phone].    

## 2014-12-25 NOTE — Assessment & Plan Note (Signed)
Patient received cycle 12, day 11 of her Velcade injection today.  She also received her monthly Zometa infusion today as well.  She continues to take both Revlimid orally and dexamethasone as previously directed.  The plan is for the patient to discontinue any further Velcade injections due to chronic issues with diarrhea secondary to the Velcade.  She will continue to take the Revlimid and dexamethasone as previously directed for the time being.  Patient has plans to follow back up with labs and a Coumadin clinic visit on 12/29/2014.  She plans to return for follow-up visit here at the [facility] on 01/01/2015.

## 2014-12-27 ENCOUNTER — Other Ambulatory Visit: Payer: Self-pay | Admitting: Oncology

## 2014-12-28 ENCOUNTER — Telehealth: Payer: Self-pay | Admitting: *Deleted

## 2014-12-28 NOTE — Telephone Encounter (Signed)
Patient reports small amount of hemoptysis at this time. No more than last week. Denies hematuria or hematochezia at this time. No new bleeding or bruising. Is aware of lab appts for tomorrow and to call us with any questions or concerns. Had some slight nausea this morning but this has subsided after taking antiemetic.

## 2014-12-29 ENCOUNTER — Ambulatory Visit: Payer: Medicare Other

## 2014-12-29 ENCOUNTER — Other Ambulatory Visit: Payer: Medicare Other

## 2014-12-29 ENCOUNTER — Ambulatory Visit (HOSPITAL_BASED_OUTPATIENT_CLINIC_OR_DEPARTMENT_OTHER): Payer: Medicare Other | Admitting: Pharmacist

## 2014-12-29 ENCOUNTER — Other Ambulatory Visit (HOSPITAL_BASED_OUTPATIENT_CLINIC_OR_DEPARTMENT_OTHER): Payer: Medicare Other

## 2014-12-29 DIAGNOSIS — I82401 Acute embolism and thrombosis of unspecified deep veins of right lower extremity: Secondary | ICD-10-CM

## 2014-12-29 LAB — PROTIME-INR
INR: 2.2 (ref 2.00–3.50)
PROTIME: 26.4 s — AB (ref 10.6–13.4)

## 2014-12-29 LAB — POCT INR: INR: 2.2

## 2014-12-29 NOTE — Patient Instructions (Signed)
INR at goal No changes Continue Coumadin  2.5mg  daily. Recheck INR on 01/05/15; lab 11:15am,Coumadin clinic at 11:30am.

## 2014-12-29 NOTE — Progress Notes (Signed)
INR at goal today at 2.2 Ms. Champney still remains very fatigued and tired She is forcing herself to eat She is having diarrhea the last 2 weeks. Possible velcade related She is stopping velcade for now. She sees Dr. Jana Hakim on Friday 1/15. She will continue on Revlimid and coumadin She is taking imodium for diarrhea and this is helping. She will continue to drink fluids to stay hydrated No missed or extra doses No unusual bleeding Ms. Berhane has had a sore throat which she is taking cough drops for which are helping Due to this she has had some coughing episodes that have caused irritation and some minor hemoptysis. No other symptoms. This is likely isolated and related to irritation from coughing We will continue same dose and see Ms. Burback next week Plans: No changes Continue Coumadin  2.5mg  daily.  Recheck INR on 01/05/15; lab 11:15am,Coumadin clinic at 11:30am.

## 2014-12-30 ENCOUNTER — Telehealth: Payer: Self-pay | Admitting: Oncology

## 2014-12-30 NOTE — Telephone Encounter (Signed)
per Gerald Stabs to sch pt CC-gave pt aware

## 2014-12-31 ENCOUNTER — Ambulatory Visit (INDEPENDENT_AMBULATORY_CARE_PROVIDER_SITE_OTHER)
Admission: RE | Admit: 2014-12-31 | Discharge: 2014-12-31 | Disposition: A | Discharge status: Home / Self Care | Payer: Medicare Other | Source: Ambulatory Visit | Attending: Internal Medicine | Admitting: Internal Medicine

## 2014-12-31 ENCOUNTER — Encounter: Payer: Self-pay | Admitting: Internal Medicine

## 2014-12-31 ENCOUNTER — Other Ambulatory Visit: Payer: Medicare Other

## 2014-12-31 ENCOUNTER — Ambulatory Visit (INDEPENDENT_AMBULATORY_CARE_PROVIDER_SITE_OTHER): Payer: Medicare Other | Admitting: Internal Medicine

## 2014-12-31 ENCOUNTER — Telehealth: Payer: Self-pay | Admitting: Internal Medicine

## 2014-12-31 VITALS — BP 112/62 | HR 77 | Ht 63.0 in | Wt 172.0 lb

## 2014-12-31 DIAGNOSIS — I2699 Other pulmonary embolism without acute cor pulmonale: Secondary | ICD-10-CM

## 2014-12-31 DIAGNOSIS — R042 Hemoptysis: Secondary | ICD-10-CM

## 2014-12-31 DIAGNOSIS — R0600 Dyspnea, unspecified: Secondary | ICD-10-CM

## 2014-12-31 NOTE — Progress Notes (Signed)
Subjective:    Patient ID: [name], female    DOB: 07-24-1946, 69 y.o.   MRN: [id number]  HPI   OV 12/31/2014   Follow-up pulmonary embolism-  diagnosed in October 2014 in the setting of knee surgery. I last saw her personally in January 2015. She is on antibiotic regulation with Coumadin. The plan was to reassess discontinuing anticoagulation at this visit because it has been over one year since commencement of anticoagulation. However in the interim in April 2015 got diagnosed with multiple myeloma and is being followed by Dr. Georga Hacking. At this point in time overall she is well except that she is deconditioned. Also for the last several weeks she's having moderate amount of dyspnea on exertion while walking around the household and relieved by rest. It is stable not associated with cough or chest pain. She's also had some hemoptysis periodically with cough in the setting of therapeutic INR. According to her and her husband symptoms are associated with fatigue. They also report that multiple myeloma might be under remission and so they'll stop chemotherapy. They're waiting for word from the oncologist   CT chest oct 2015 - No PE but alveolar hge v pneumonia  Walking desatiuration test in office 185 x 3 laps: 2 laps 94%, HR 101. Stopped due to dyspnea  Review of Systems  Constitutional: Negative for fever and unexpected weight change.  HENT: Negative for congestion, dental problem, ear pain, nosebleeds, postnasal drip, rhinorrhea, sinus pressure, sneezing, sore throat and trouble swallowing.   Eyes: Negative for redness and itching.  Respiratory: Positive for cough and shortness of breath. Negative for chest tightness and wheezing.   Cardiovascular: Negative for palpitations and leg swelling.  Gastrointestinal: Negative for nausea and vomiting.  Genitourinary: Negative for dysuria.  Musculoskeletal: Negative for joint swelling.  Skin: Negative for rash.  Neurological:  Negative for headaches.  Hematological: Does not bruise/bleed easily.  Psychiatric/Behavioral: Negative for dysphoric mood. The patient is not nervous/anxious.        Objective:   Physical Exam  Constitutional: She is oriented to person, place, and time. She appears well-developed and well-nourished. No distress.  Obese Deconditioned looking  HENT:  Head: Normocephalic and atraumatic.  Right Ear: External ear normal.  Left Ear: External ear normal.  Mouth/Throat: Oropharynx is clear and moist. No oropharyngeal exudate.  Eyes: Conjunctivae and EOM are normal. Pupils are equal, round, and reactive to light. Right eye exhibits no discharge. Left eye exhibits no discharge. No scleral icterus.  Neck: Normal range of motion. Neck supple. No JVD present. No tracheal deviation present. No thyromegaly present.  Cardiovascular: Normal rate, regular rhythm, normal heart sounds and intact distal pulses.  Exam reveals no gallop and no friction rub.   No murmur heard. Pulmonary/Chest: Effort normal and breath sounds normal. No respiratory distress. She has no wheezes. She has no rales. She exhibits no tenderness.  Abdominal: Soft. Bowel sounds are normal. She exhibits no distension and no mass. There is no tenderness. There is no rebound and no guarding.  Musculoskeletal: Normal range of motion. She exhibits no edema or tenderness.  Lymphadenopathy:    She has no cervical adenopathy.  Neurological: She is alert and oriented to person, place, and time. She has normal reflexes. No cranial nerve deficit. She exhibits normal muscle tone. Coordination normal.  Skin: Skin is warm and dry. No rash noted. She is not diaphoretic. No erythema. No pallor.  Psychiatric: She has a normal mood and affect. Her behavior is  normal. Judgment and thought content normal.  Vitals reviewed.   Filed Vitals:   12/31/14 1340  BP: 112/62  Pulse: 77  Height: _0  (1.6 m)  Weight: 172 lb (78.019 kg)  SpO2: 96%          Assessment & Plan:     ICD-9-CM ICD-10-CM   1. Pulmonary embolism 415.19 I26.99   2. Dyspnea 786.09 R06.00   3. Hemoptysis 786.30 R04.2    Suspect dyspnea is related to deconditioning from cancer related and chemotherapy related fatigue. This based on tachycardia and the fact she stopped prematurely after walking 360 feet. However she still has ongoing hemoptysis and dyspnea might be related to this so we will get a chest x-ray. Most recent INR 4 days ago was 2.2 on 12/29/2014. The other determination we need to make is duration of anticoagulation for pulmonary embolism. She's now been on anti-coagulation for 15 months. If cancer is under remission and today's d-dimer is normal then we can stop anticoagulation  Based on results and communication with oncologist I will inform her with the plan    > 50% of this > 25 min visit spent in face to face counseling or coordination of care (15 min visit converted to 25 min)    Dr. Brand Males, M.D., [facility].C.P Pulmonary and Critical Care Medicine Staff Physician [facility] Pager: [phone], If no answer or between  15:00h - 7:00h: call [phone]  12/31/2014 2:21 PM

## 2014-12-31 NOTE — Patient Instructions (Addendum)
ICD-9-CM ICD-10-CM   1. Pulmonary embolism 415.19 I26.99   2. Dyspnea 786.09 R06.00   3. Hemoptysis 786.30 R04.2    Suspect shortness of breath is due to cancer and chemo fatigue We also need to decide when to stop coumadin  PLAN D-dimer today CXR today Will send message to Dr Jana Hakim about status of your myeloma Based on resutls of all of above will decide on stopping coumadin  Followup  - based on results of above

## 2014-12-31 NOTE — Telephone Encounter (Deleted)
This was sent to triage. Please advise MR thanks 

## 2014-12-31 NOTE — Telephone Encounter (Signed)
[name]  Please let me know if [name] myelmoma is in remission so I can decide about stopping coumadin  THanks  Dr. Brand Males, M.D., [facility].C.P Pulmonary and Critical Care Medicine Staff Physician [facility] Pager: [phone], If no answer or between  15:00h - 7:00h: call [phone]  12/31/2014 2:22 PM

## 2015-01-01 ENCOUNTER — Other Ambulatory Visit (HOSPITAL_COMMUNITY)
Admission: AD | Admit: 2015-01-01 | Discharge: 2015-01-01 | Disposition: A | Discharge status: Home / Self Care | Payer: Medicare Other | Source: Ambulatory Visit | Attending: Oncology | Admitting: Oncology

## 2015-01-01 ENCOUNTER — Ambulatory Visit (HOSPITAL_BASED_OUTPATIENT_CLINIC_OR_DEPARTMENT_OTHER): Payer: Medicare Other

## 2015-01-01 ENCOUNTER — Other Ambulatory Visit: Payer: Self-pay

## 2015-01-01 ENCOUNTER — Ambulatory Visit: Payer: Medicare Other | Admitting: Pharmacist

## 2015-01-01 ENCOUNTER — Ambulatory Visit (HOSPITAL_BASED_OUTPATIENT_CLINIC_OR_DEPARTMENT_OTHER): Payer: Medicare Other | Admitting: Oncology

## 2015-01-01 ENCOUNTER — Telehealth: Payer: Self-pay | Admitting: Oncology

## 2015-01-01 VITALS — BP 114/61 | HR 75 | Temp 97.5°F | Resp 18 | Ht 63.0 in | Wt 172.2 lb

## 2015-01-01 DIAGNOSIS — I2699 Other pulmonary embolism without acute cor pulmonale: Secondary | ICD-10-CM

## 2015-01-01 DIAGNOSIS — D62 Acute posthemorrhagic anemia: Secondary | ICD-10-CM

## 2015-01-01 DIAGNOSIS — I82401 Acute embolism and thrombosis of unspecified deep veins of right lower extremity: Secondary | ICD-10-CM

## 2015-01-01 DIAGNOSIS — C9002 Multiple myeloma in relapse: Secondary | ICD-10-CM

## 2015-01-01 DIAGNOSIS — C9000 Multiple myeloma not having achieved remission: Secondary | ICD-10-CM

## 2015-01-01 DIAGNOSIS — I10 Essential (primary) hypertension: Secondary | ICD-10-CM

## 2015-01-01 LAB — POCT INR: INR: 2

## 2015-01-01 LAB — PROTIME-INR
INR: 2 (ref 2.00–3.50)
Protime: 24 Seconds — ABNORMAL HIGH (ref 10.6–13.4)

## 2015-01-01 LAB — D-DIMER, QUANTITATIVE (NOT AT ARMC): D DIMER QUANT: 0.32 ug{FEU}/mL (ref 0.00–0.48)

## 2015-01-01 MED ORDER — CHOLESTYRAMINE 4 G PO PACK: 4.0000 g | PACK | Freq: Two times a day (BID) | ORAL | Status: DC

## 2015-01-01 NOTE — Progress Notes (Signed)
Per lab, pt unable to provide stool sample.  Pt discharged home with sample cup, she is to return sample to the clinic next week.  Lab order re-entered, as original order had been released.

## 2015-01-01 NOTE — Telephone Encounter (Signed)
per pof to sch pt appt-sent MW email to sch trmts-sent Lattie Haw T an email to see if she can see pt in 1/26 per pof-adv pt to get updayted sch on 1/19-pt undserstood

## 2015-01-01 NOTE — Progress Notes (Signed)
[name]  [phone] [phone]     ID: [name] OB: [birth year]  MR#: [id number]  GNF#:[id number]  PCP: Sue Reichmann, MD GYN:   SU:  OTHER MD: Sue Marble, MD;  Sue Coco, MD  CHIEF COMPLAINT: Multiple myeloma, under active treatment CURRENT TREATMENT: dexamethasone, bortezomib, lenalidomide zometa  MYELOMA HISTORY: From the original consult note, dated 03/24/2014:  "The patient has a history of PE following arthroscopic surgery,as well as LLE DVT requiring Coumadin therapy since October of 2014. On 4/7 she presented to the ED with a 2 day history of hemoptysis and mucous material. INR was elevated at 6 requiring FFP and Vit K with improvement of coagulopathy (INR 1.4 this morning). Anticoagulation is on hold until hemoptysis resolves.CT angiogram on 4/7 was negative for pulmonary embolus. Bilateral pneumonia was suspected, requiring IV antibiotics.  Interestingly, diffusely heterogeneous appearance to the visualized bones were seen, more notable than on a prior CT in 2010. Bone scan on 4/8 showed old fractures in posterior 8-9 th ribs, and in a lumbar XR a new lumbar spinal fracture was noted, not picked up by the bone scan. Given the mottled appearance of the CT films,workup for multiple myeloma was initiated: She had anemia in the setting of chronic disease, iron deficiency and blood loss, and mild renal insufficiency was seen with a Cr 1.62. Sodium was normal. Her Calcium however was 12.3 on admission, today at greater than 15 receiving Calcitonin 100U nasal spray, Zometa 4 mg and hydration IV. SPEP / UPEP / IFE are pending. "  Her subsequent history is as detailed below.  INTERVAL HISTORY: [name] returns today for follow up of her multiple myeloma, accompanied by her husband [name]. Today is day 18, cycle 12 of subcutaneous bortezomib which she receives on days 1, 4, 8, and 11 of each 21 day cycle. She also takes oral dexamethasone,  20 mg every Tuesday and Wednesday.  She continues on zoledronic acid every 12 weeks,, most recent dose 11/06/2014. Her lenalidomide was held mid-September but  resumed at 10 mg/d early October 2015. She receives that 21 days on, 7 days off every 28 day cycle. She is 3 days short of completing a cycle today  REVIEW OF SYSTEMS: [name] has been having significant diarrhea. Some days she has 5-10 loose bowel movements. She tells me this has been going on for 3 weeks, but I have been seeing her quite frequently and was not aware of this. Apparently it is very intermittent. She has been taking Imodium for this, with some control. She's had 2 Imodium's today. She had 6 yesterday and for the day before. She continues to feel short of breath as before, and has had mild hemoptysis. She has had no further severe epistaxis issues. Overall she feels her breathing is stable. She's had some cramping in her abdomen. Her blood sugars are moderately well-controlled. A detailed review of systems today was otherwise stable  PAST MEDICAL HISTORY: Past Medical History  Diagnosis Date  . Neurofibromatosis   . HTN (hypertension)   . Obesity   . Hyperlipidemia     statin intolerant. LDL is 83  . UTI (lower urinary tract infection) 05/29/12  . Pulmonary embolism 09/17/2013  . STEMI (ST elevation myocardial infarction) 05/29/2012    Inferolateral STEMI ; s/p DES to RCA, nl EF  . Multiple myeloma     [name]   . DVT (deep venous thrombosis) 08/2014    RLE  . Type II diabetes mellitus   .  History of blood transfusion ?2015    "blood count dropped real low"  . Osteoarthritis, knee   . Arthritis     "shoulders hurt" (09/30/2014)    PAST SURGICAL HISTORY: Past Surgical History  Procedure Laterality Date  . Clavicle surgery Left ~ 1960  . Ankle fracture surgery Right ?1990's  . Total knee arthroplasty Left 09/15/2013    Procedure: TOTAL KNEE ARTHROPLASTY;  Surgeon: Sue Huger, MD;  Location: [facility];  Service:  Orthopedics;  Laterality: Left;  . Hernia repair    . Coronary angioplasty with stent placement  05/2012    "1"  . Tonsillectomy and adenoidectomy  1950's  . Fracture surgery    . Bone marrow biopsy  03/2014    [name]   . Vaginal hysterectomy  1990's  . Left heart catheterization with coronary angiogram N/A 05/29/2012    Procedure: LEFT HEART CATHETERIZATION WITH CORONARY ANGIOGRAM;  Surgeon: Sue Booze, MD;  Location: [facility] CATH LAB;  Service: Cardiovascular;  Laterality: N/A;    FAMILY HISTORY Family History  Problem Relation Age of Onset  . Heart disease Mother   . Arthritis Mother   . Stroke Mother   . Heart disease Son     SOCIAL HISTORY:    (reviewed 05/19/2014)  Lives in [place] with husband of 73 years, [name]. 2 sons in good health.      ADVANCED DIRECTIVES:    HEALTH MAINTENANCE: (updated 05/19/2014)  History  Substance Use Topics  . Smoking status: Never Smoker   . Smokeless tobacco: Never Used  . Alcohol Use: No     Colonoscopy:  not on file   PAP: not on file   Bone density: not on file   Lipid panel: December 2014   Allergies  Allergen Reactions  . Codeine Nausea And Vomiting    Sees things  . Hydrocodone Nausea And Vomiting    Sees things  . Niaspan [Niacin Er] Nausea Only  . Robaxin [Methocarbamol] Other (See Comments)    Made patient feel funny  . Statins Nausea And Vomiting and Other (See Comments)    Myalgias/leg pain with atorvastatin, rosuvastatin, lovastatin, simvastatin  . Tetracycline Other (See Comments)    Pt doesn't remember reaction  . Zetia [Ezetimibe] Nausea And Vomiting  . Zithromax [Azithromycin] Nausea And Vomiting    Current Outpatient Prescriptions  Medication Sig Dispense Refill  . acetaminophen (TYLENOL) 325 MG tablet Take 1-2 tablets (325-650 mg total) by mouth every 4 (four) hours as needed for mild pain.    . cetirizine (ZYRTEC) 10 MG tablet Take 10 mg by mouth daily as needed.     . clindamycin  (CLINDAGEL) 1 % gel Apply topically 2 (two) times daily. 30 g 0  . dexamethasone (DECADRON) 4 MG tablet Take 5 tablets (20 mg total) by mouth as directed. 5 pills on Tuesday, Wednesday only 45 tablet 0  . erythromycin ophthalmic ointment   2  . glimepiride (AMARYL) 2 MG tablet Take 2 tablets by mouth every morning with breakfast.  On the days that you take prednisone or have chemotherapy take another dose at the evening meal 90 tablet 1  . glucose blood (ONE TOUCH ULTRA TEST) test strip 1 each by Other route 3 (three) times daily. 100 each 12  . Lancets MISC Check glucose 3 times daily 100 each 6  . lenalidomide (REVLIMID) 10 MG capsule Take 1 capsule (10 mg total) by mouth daily. 21 capsule 0  . metoprolol tartrate (LOPRESSOR) 25 MG tablet Take  25 mg by mouth 2 (two) times daily.    . Multiple Vitamin (MULTIVITAMIN WITH MINERALS) TABS tablet Take 1 tablet by mouth at bedtime. Centrum Silver    . Multiple Vitamins-Minerals (OCUVITE PO) Take 1 tablet by mouth daily.     Marland Kitchen NITROSTAT 0.4 MG SL tablet DISSOLVE 1 TABLET UNDER THE TONGUE EVERY 5 MINUTES AS NEEDED FOR CHEST PAIN. UP TO 3 DOSES 25 tablet 0  . nystatin (MYCOSTATIN/NYSTOP) 100000 UNIT/GM POWD Apply 1 g topically 3 (three) times daily as needed. 30 g 1  . polyethylene glycol (MIRALAX / GLYCOLAX) packet Take 17 g by mouth daily.    . prochlorperazine (COMPAZINE) 10 MG tablet Take 1 tablet (10 mg total) by mouth every 6 (six) hours as needed (Nausea or vomiting). 90 tablet 4  . promethazine (PHENERGAN) 50 MG tablet   0  . traMADol (ULTRAM) 50 MG tablet Take 1 tablet (50 mg total) by mouth every 6 (six) hours as needed for moderate pain. 30 tablet 2  . warfarin (COUMADIN) 5 MG tablet Take 1 tablet (5 mg total) by mouth daily. 30 tablet 3   No current facility-administered medications for this visit.    OBJECTIVE: Middle-aged white woman in no acute distress Filed Vitals:   01/01/15 1336  BP: 114/61  Pulse: 75  Temp: 97.5 F (36.4 C)   Resp: 18     Body mass index is 30.51 kg/(m^2).    ECOG FS:1 - Symptomatic but completely ambulatory Filed Weights   01/01/15 1336  Weight: 172 lb 3.2 oz (78.109 kg)   Sclerae unicteric, EOMs intact Oropharynx clear and moist  No cervical or supraclavicular adenopathy Lungs no rales or rhonchi Heart regular rate and rhythm Abd soft, obese, nontender, positive bowel sounds MSK no focal spinal tenderness Neuro: nonfocal, well oriented,  Pleasant Breast exam: Deferred Breasts: Deferred   LAB RESULTS:  Lab Results  Component Value Date   WBC 7.0 12/17/2014   NEUTROABS 4.2 12/17/2014   HGB 9.4* 12/17/2014   HCT 30.6* 12/17/2014   MCV 85.0 12/17/2014   PLT 352 12/17/2014      Chemistry      Component Value Date/Time   NA 140 12/17/2014 1204   NA 140 11/03/2014 1326   K 3.7 12/17/2014 1204   K 4.7 11/03/2014 1326   CL 100 11/03/2014 1326   CO2 24 12/17/2014 1204   CO2 25 11/03/2014 1326   BUN 13.6 12/17/2014 1204   BUN 20 11/03/2014 1326   CREATININE 1.0 12/17/2014 1204   CREATININE 0.89 11/03/2014 1326   CREATININE 0.81 12/09/2013 1000   GLU 173 11/08/2010      Component Value Date/Time   CALCIUM 8.4 12/17/2014 1204   CALCIUM 9.2 11/03/2014 1326   CALCIUM 11.1* 03/25/2014 0850   ALKPHOS 45 12/17/2014 1204   ALKPHOS 59 11/03/2014 1326   AST 15 12/17/2014 1204   AST 22 11/03/2014 1326   ALT 14 12/17/2014 1204   ALT 24 11/03/2014 1326   BILITOT 0.31 12/17/2014 1204   BILITOT 0.6 11/03/2014 1326      STUDIES: Dg Chest 2 View  12/31/2014   CLINICAL DATA:  Shortness of breath for 3 weeks, hemoptysis for 5 days, history hypertension, diabetes, pulmonary embolism, multiple myeloma  EXAM: CHEST  2 VIEW  COMPARISON:  10/07/2014  FINDINGS: Minimal enlargement of cardiac silhouette.  Mediastinal contours and pulmonary vascularity normal.  Lungs clear.  No pleural effusion or pneumothorax.  Bones demineralized.  Compression deformities of adjacent vertebrae at  the  thoracolumbar junction new at one level and progressive at adjacent level since bone survey 04/13/2014 but little changed since 10/07/2014.  IMPRESSION: Compression deformities of adjacent vertebrae at the thoracolumbar junction progressive since 04/13/2014 but little changed since 10/07/2014.   Electronically Signed   By: [name] M.D.   On: 12/31/2014 20:00    ASSESSMENT: 69 [place] presenting with hypercalcemia, anemia and mottled bone lesions April 2015, M-spike 3.82 g, IFE showing IgA/kappa myeloma with bone marrow biopsy 03/30/2014 with 88% plasma cells, FISH  t(4,14), deletion 13 and deletion 11 (intermediate risk); initial beta-2 microglobulin 16.  (1) considered E1A11 study, but unable to tolerate anticoagulation  (2) associated issues:  (a) coagulopathy: history of post-op PE October 2014; history of bleeding with warfarin; epistaxis with  ASA 325 ms/ day; switched to 81 mg/ day with fair tolerance, then fully coumadinized until 01/05/2015  (c) hypercalcemia: started zolendronate 03/26/2014, to be repeated every 12 weeks  (d) pain: compression fracture L1, rib fractures: on tylenol and tramadol  (e) immunocompromise: on Septra prophylaxis, valacyclovir and acyclovir both stopped because of uncontrolled diarrhea  (3) started dexamethasone 40 mg/ week (20 mg po on Tues and Weds April 0923); complicated by hyperglycemia with history of diabetes, followed by [name]  (4) started bortezomib SQ 04/28/2014, to be given days 1,4, 8 and 11 of ech 21 day cycle  (5) started lenalidomide first week in September 2015, held after 1 cycle with poor tolerance; resumed 09/30/2014 at 10 mg daily, days 1 through 21 of each 28 day cycle; stopped 01/02/2015, with little evidence of response  (6) cyclophosphamide started 01/05/2015 at fixed dose (approximately 300 mg/m IV), repeated weekly  PLAN: I have been trying to simplify Genell's treatment plan, and had hoped to be able to switch  her off bortezomib and only continue the lenalidomide and dexamethasone. However her IgA is a little bit up as is the Past/lambda ratio.  While she has responded to this regimen, and it appears to be holding things under moderate control, we are not getting to the point where I can keep her on lenalidomide alone, particularly since the addition of that drug to bortezomib and dexamethasone does not appear to have added much in terms of effectiveness to her regimen.  Accordingly I am switching her from lenalidomide 2 cyclophosphamide. We are going to give this intravenous instead of orally. She will continue the bortezomib and dexamethasone as before. More specifically: She will be starting her 13th cycle of bortezomib on January 19. She will receive her first cyclophosphamide dose on that day. This cyclophosphamide will be repeated weekly for 12 weeks. The bortezomib of courses given days 1,4, 8 and 11 of each 21 day cycle. In addition she will continue her Decadron which she receives on Tuesdays and Wednesdays.  This particular regimen does not require anticoagulation. I have discussed her case with Dr. Chase Caller in pulmonary. He obtained a d-dimer which was in the normal range. He feels from a pulmonary embolus point of view it is safe to stop the Coumadin. He suggested Jaquayla take a baby aspirin instead.  Accordingly she will stop her lenalidomide as of today. She will stop her Coumadin on January 19, and that is her last scheduled appointment with our Coumadin clinic. We will continue to monitor her myeloma at the beginning of each cycle. If we do not have a better response within the next 3 months I will refer her to [facility] for further evaluation.  We are  setting a stool sample today for C. difficile by PCR.  Meerab has a good understanding of this plan. She agrees with it. She knows the goal of treatment in her case is control. She will call with any problems that may develop before her next visit  here.       Chauncey Cruel, MD   01/01/2015 2:13 PM

## 2015-01-01 NOTE — Progress Notes (Signed)
Lab reported INR to pharmacy today = 2 Pt seen in lobby w/ her husband. She was just seen earlier this week in Coumadin clinic.  She is stopping Coumadin 01/05/15 per Dr. Jana Hakim (going off Revlimid.  Changing to Velcade + CTX).  She was instructed to go on ASA for clot prevention after 01/05/15 by Dr. Jana Hakim. Pt aware no more INR's necessary since she will be off Coumadin. I have archived her from our Coumadin clinic. Kennith Center, Pharm.D., CPP 01/01/2015@4 :29 PM

## 2015-01-04 ENCOUNTER — Other Ambulatory Visit: Payer: Self-pay | Admitting: *Deleted

## 2015-01-04 ENCOUNTER — Telehealth: Payer: Self-pay | Admitting: Oncology

## 2015-01-04 ENCOUNTER — Telehealth: Payer: Self-pay | Admitting: *Deleted

## 2015-01-04 ENCOUNTER — Other Ambulatory Visit: Payer: Self-pay | Admitting: Oncology

## 2015-01-04 DIAGNOSIS — C9002 Multiple myeloma in relapse: Secondary | ICD-10-CM

## 2015-01-04 NOTE — Telephone Encounter (Signed)
Per staff message and POF I have scheduled appts. Advised scheduler of appts and to adjust lab appts. JMW

## 2015-01-04 NOTE — Telephone Encounter (Signed)
per pof to sch pt appt-sent email to MW-MW sent email to GM-GM sent email to Meade Maw to see if pt can be kept on trmt sch-pt cld 01/04/15 am to get appt time-adv awaiting reply-adv pt that I would call heer back once reply

## 2015-01-04 NOTE — Telephone Encounter (Signed)
following up for [facility] who left early. adjusted 1/19 lab time to be before tx and called pt re appts. s/w pt re appts for 1/19 and 1/22. pt will get new schedule when she comes in tomorrow.

## 2015-01-05 ENCOUNTER — Other Ambulatory Visit (HOSPITAL_BASED_OUTPATIENT_CLINIC_OR_DEPARTMENT_OTHER): Payer: Medicare Other

## 2015-01-05 ENCOUNTER — Other Ambulatory Visit: Payer: Medicare Other

## 2015-01-05 ENCOUNTER — Other Ambulatory Visit (HOSPITAL_COMMUNITY)
Admission: RE | Admit: 2015-01-05 | Discharge: 2015-01-05 | Disposition: A | Discharge status: Home / Self Care | Payer: Medicare Other | Source: Ambulatory Visit | Attending: Oncology | Admitting: Oncology

## 2015-01-05 ENCOUNTER — Other Ambulatory Visit: Payer: Self-pay | Admitting: Nurse Practitioner

## 2015-01-05 ENCOUNTER — Ambulatory Visit: Payer: Medicare Other

## 2015-01-05 ENCOUNTER — Ambulatory Visit (HOSPITAL_BASED_OUTPATIENT_CLINIC_OR_DEPARTMENT_OTHER): Payer: Medicare Other

## 2015-01-05 VITALS — BP 120/57 | HR 78 | Temp 97.9°F | Resp 16

## 2015-01-05 DIAGNOSIS — Z5111 Encounter for antineoplastic chemotherapy: Secondary | ICD-10-CM

## 2015-01-05 DIAGNOSIS — I2699 Other pulmonary embolism without acute cor pulmonale: Secondary | ICD-10-CM | POA: Insufficient documentation

## 2015-01-05 DIAGNOSIS — C9002 Multiple myeloma in relapse: Secondary | ICD-10-CM

## 2015-01-05 DIAGNOSIS — Z5112 Encounter for antineoplastic immunotherapy: Secondary | ICD-10-CM

## 2015-01-05 DIAGNOSIS — C9000 Multiple myeloma not having achieved remission: Secondary | ICD-10-CM

## 2015-01-05 LAB — KAPPA/LAMBDA LIGHT CHAINS
Kappa free light chain: 50.4 mg/dL — ABNORMAL HIGH (ref 0.33–1.94)
Kappa:Lambda Ratio: 420 — ABNORMAL HIGH (ref 0.26–1.65)
LAMBDA FREE LGHT CHN: 0.12 mg/dL — AB (ref 0.57–2.63)

## 2015-01-05 LAB — PROTEIN ELECTROPHORESIS, SERUM
ALPHA-1-GLOBULIN: 4.1 % (ref 2.9–4.9)
Albumin ELP: 39 % — ABNORMAL LOW (ref 55.8–66.1)
Alpha-2-Globulin: 7.8 % (ref 7.1–11.8)
BETA 2: 41.2 % — AB (ref 3.2–6.5)
BETA GLOBULIN: 6.2 % (ref 4.7–7.2)
Gamma Globulin: 1.7 % — ABNORMAL LOW (ref 11.1–18.8)
M-SPIKE, %: 0.35 g/dL
Total Protein, Serum Electrophoresis: 7.7 g/dL (ref 6.0–8.3)

## 2015-01-05 LAB — CBC WITH DIFFERENTIAL/PLATELET
BASO%: 1.1 % (ref 0.0–2.0)
Basophils Absolute: 0.1 10*3/uL (ref 0.0–0.1)
EOS%: 8.3 % — ABNORMAL HIGH (ref 0.0–7.0)
Eosinophils Absolute: 0.5 10*3/uL (ref 0.0–0.5)
HCT: 29.6 % — ABNORMAL LOW (ref 34.8–46.6)
HGB: 9.3 g/dL — ABNORMAL LOW (ref 11.6–15.9)
LYMPH#: 1.1 10*3/uL (ref 0.9–3.3)
LYMPH%: 17.9 % (ref 14.0–49.7)
MCH: 26.1 pg (ref 25.1–34.0)
MCHC: 31.4 g/dL — ABNORMAL LOW (ref 31.5–36.0)
MCV: 82.9 fL (ref 79.5–101.0)
MONO#: 0.9 10*3/uL (ref 0.1–0.9)
MONO%: 14.6 % — AB (ref 0.0–14.0)
NEUT#: 3.6 10*3/uL (ref 1.5–6.5)
NEUT%: 58.1 % (ref 38.4–76.8)
Platelets: 322 10*3/uL (ref 145–400)
RBC: 3.57 10*6/uL — ABNORMAL LOW (ref 3.70–5.45)
RDW: 17.1 % — ABNORMAL HIGH (ref 11.2–14.5)
WBC: 6.2 10*3/uL (ref 3.9–10.3)
nRBC: 0 % (ref 0–0)

## 2015-01-05 LAB — COMPREHENSIVE METABOLIC PANEL (CC13)
ALK PHOS: 54 U/L (ref 40–150)
ALT: 24 U/L (ref 0–55)
ANION GAP: 12 meq/L — AB (ref 3–11)
AST: 24 U/L (ref 5–34)
Albumin: 2.5 g/dL — ABNORMAL LOW (ref 3.5–5.0)
BUN: 17.8 mg/dL (ref 7.0–26.0)
CO2: 21 mEq/L — ABNORMAL LOW (ref 22–29)
Calcium: 8.3 mg/dL — ABNORMAL LOW (ref 8.4–10.4)
Chloride: 102 mEq/L (ref 98–109)
Creatinine: 1.1 mg/dL (ref 0.6–1.1)
EGFR: 53 mL/min/{1.73_m2} — AB (ref >90)
Glucose: 158 mg/dl — ABNORMAL HIGH (ref 70–140)
Potassium: 4.7 mEq/L (ref 3.5–5.1)
Sodium: 136 mEq/L (ref 136–145)
TOTAL PROTEIN: 8.2 g/dL (ref 6.4–8.3)
Total Bilirubin: 0.4 mg/dL (ref 0.20–1.20)

## 2015-01-05 LAB — CLOSTRIDIUM DIFFICILE BY PCR: CDIFFPCR: NEGATIVE

## 2015-01-05 MED ORDER — ONDANSETRON 16 MG/50ML IVPB (CHCC)
16.0000 mg | Freq: Once | INTRAVENOUS | Status: AC
  Administered 2015-01-05: 16 mg via INTRAVENOUS

## 2015-01-05 MED ORDER — ONDANSETRON HCL 8 MG PO TABS: 8.0000 mg | ORAL_TABLET | Freq: Once | ORAL | Status: DC

## 2015-01-05 MED ORDER — SODIUM CHLORIDE 0.9 % IV SOLN
500.0000 mg | Freq: Once | INTRAVENOUS | Status: AC
  Administered 2015-01-05: 500 mg via INTRAVENOUS
  Filled 2015-01-05: qty 25

## 2015-01-05 MED ORDER — ONDANSETRON 16 MG/50ML IVPB (CHCC)
INTRAVENOUS | Status: AC
  Filled 2015-01-05: qty 16

## 2015-01-05 MED ORDER — BORTEZOMIB CHEMO SQ INJECTION 3.5 MG (2.5MG/ML)
1.3000 mg/m2 | Freq: Once | INTRAMUSCULAR | Status: AC
  Administered 2015-01-05: 2.5 mg via SUBCUTANEOUS
  Filled 2015-01-05: qty 2.5

## 2015-01-05 MED ORDER — ONDANSETRON 8 MG/NS 50 ML IVPB
INTRAVENOUS | Status: AC
  Filled 2015-01-05: qty 8

## 2015-01-05 NOTE — Patient Instructions (Addendum)
Cyclophosphamide injection What is this medicine? CYCLOPHOSPHAMIDE (sye kloe FOSS fa mide) is a chemotherapy drug. It slows the growth of cancer cells. This medicine is used to treat many types of cancer like lymphoma, myeloma, leukemia, breast cancer, and ovarian cancer, to name a few. This medicine may be used for other purposes; ask your health care provider or pharmacist if you have questions. COMMON BRAND NAME(S): Cytoxan, Neosar What should I tell my health care provider before I take this medicine? They need to know if you have any of these conditions: -blood disorders -history of other chemotherapy -infection -kidney disease -liver disease -recent or ongoing radiation therapy -tumors in the bone marrow -an unusual or allergic reaction to cyclophosphamide, other chemotherapy, other medicines, foods, dyes, or preservatives -pregnant or trying to get pregnant -breast-feeding How should I use this medicine? This drug is usually given as an injection into a vein or muscle or by infusion into a vein. It is administered in a hospital or clinic by a specially trained health care professional. Talk to your pediatrician regarding the use of this medicine in children. Special care may be needed. Overdosage: If you think you have taken too much of this medicine contact a poison control center or emergency room at once. NOTE: This medicine is only for you. Do not share this medicine with others. What if I miss a dose? It is important not to miss your dose. Call your doctor or health care professional if you are unable to keep an appointment. What may interact with this medicine? This medicine may interact with the following medications: -amiodarone -amphotericin B -azathioprine -certain antiviral medicines for HIV or AIDS such as protease inhibitors (e.g., indinavir, ritonavir) and zidovudine -certain blood pressure medications such as benazepril, captopril, enalapril, fosinopril,  lisinopril, moexipril, monopril, perindopril, quinapril, ramipril, trandolapril -certain cancer medications such as anthracyclines (e.g., daunorubicin, doxorubicin), busulfan, cytarabine, paclitaxel, pentostatin, tamoxifen, trastuzumab -certain diuretics such as chlorothiazide, chlorthalidone, hydrochlorothiazide, indapamide, metolazone -certain medicines that treat or prevent blood clots like warfarin -certain muscle relaxants such as succinylcholine -cyclosporine -etanercept -indomethacin -medicines to increase blood counts like filgrastim, pegfilgrastim, sargramostim -medicines used as general anesthesia -metronidazole -natalizumab This list may not describe all possible interactions. Give your health care provider a list of all the medicines, herbs, non-prescription drugs, or dietary supplements you use. Also tell them if you smoke, drink alcohol, or use illegal drugs. Some items may interact with your medicine. What should I watch for while using this medicine? Visit your doctor for checks on your progress. This drug may make you feel generally unwell. This is not uncommon, as chemotherapy can affect healthy cells as well as cancer cells. Report any side effects. Continue your course of treatment even though you feel ill unless your doctor tells you to stop. Drink water or other fluids as directed. Urinate often, even at night. In some cases, you may be given additional medicines to help with side effects. Follow all directions for their use. Call your doctor or health care professional for advice if you get a fever, chills or sore throat, or other symptoms of a cold or flu. Do not treat yourself. This drug decreases your body's ability to fight infections. Try to avoid being around people who are sick. This medicine may increase your risk to bruise or bleed. Call your doctor or health care professional if you notice any unusual bleeding. Be careful brushing and flossing your teeth or using a  toothpick because you may get an infection or bleed   more easily. If you have any dental work done, tell your dentist you are receiving this medicine. You may get drowsy or dizzy. Do not drive, use machinery, or do anything that needs mental alertness until you know how this medicine affects you. Do not become pregnant while taking this medicine or for 1 year after stopping it. Women should inform their doctor if they wish to become pregnant or think they might be pregnant. Men should not father a child while taking this medicine and for 4 months after stopping it. There is a potential for serious side effects to an unborn child. Talk to your health care professional or pharmacist for more information. Do not breast-feed an infant while taking this medicine. This medicine may interfere with the ability to have a child. This medicine has caused ovarian failure in some women. This medicine has caused reduced sperm counts in some men. You should talk with your doctor or health care professional if you are concerned about your fertility. If you are going to have surgery, tell your doctor or health care professional that you have taken this medicine. What side effects may I notice from receiving this medicine? Side effects that you should report to your doctor or health care professional as soon as possible: -allergic reactions like skin rash, itching or hives, swelling of the face, lips, or tongue -low blood counts - this medicine may decrease the number of white blood cells, red blood cells and platelets. You may be at increased risk for infections and bleeding. -signs of infection - fever or chills, cough, sore throat, pain or difficulty passing urine -signs of decreased platelets or bleeding - bruising, pinpoint red spots on the skin, black, tarry stools, blood in the urine -signs of decreased red blood cells - unusually weak or tired, fainting spells, lightheadedness -breathing problems -dark  urine -dizziness -palpitations -swelling of the ankles, feet, hands -trouble passing urine or change in the amount of urine -weight gain -yellowing of the eyes or skin Side effects that usually do not require medical attention (report to your doctor or health care professional if they continue or are bothersome): -changes in nail or skin color -hair loss -missed menstrual periods -mouth sores -nausea, vomiting This list may not describe all possible side effects. Call your doctor for medical advice about side effects. You may report side effects to FDA at 1-800-FDA-1088. Where should I keep my medicine? This drug is given in a hospital or clinic and will not be stored at home. NOTE: This sheet is a summary. It may not cover all possible information. If you have questions about this medicine, talk to your doctor, pharmacist, or health care provider.  2015, Elsevier/Gold Standard. (2012-10-18 16:22:58) [facility] Discharge Instructions for Patients Receiving Chemotherapy  Today you received the following chemotherapy agents Cytoxan  To help prevent nausea and vomiting after your treatment, we encourage you to take your nausea medication as directed   If you develop nausea and vomiting that is not controlled by your nausea medication, call the clinic.   BELOW ARE SYMPTOMS THAT SHOULD BE REPORTED IMMEDIATELY:  *FEVER GREATER THAN 100.5 F  *CHILLS WITH OR WITHOUT FEVER  NAUSEA AND VOMITING THAT IS NOT CONTROLLED WITH YOUR NAUSEA MEDICATION  *UNUSUAL SHORTNESS OF BREATH  *UNUSUAL BRUISING OR BLEEDING  TENDERNESS IN MOUTH AND THROAT WITH OR WITHOUT PRESENCE OF ULCERS  *URINARY PROBLEMS  *BOWEL PROBLEMS  UNUSUAL RASH Items with * indicate a potential emergency and should be followed up  as soon as possible.  Feel free to call the clinic you have any questions or concerns. The clinic phone number is [phone].   [place] Discharge  Instructions for Patients Receiving Chemotherapy  Today you received the following chemotherapy agents Cytoxan  To help prevent nausea and vomiting after your treatment, we encourage you to take your nausea medication as directed.   If you develop nausea and vomiting that is not controlled by your nausea medication, call the clinic.   BELOW ARE SYMPTOMS THAT SHOULD BE REPORTED IMMEDIATELY:  *FEVER GREATER THAN 100.5 F  *CHILLS WITH OR WITHOUT FEVER  NAUSEA AND VOMITING THAT IS NOT CONTROLLED WITH YOUR NAUSEA MEDICATION  *UNUSUAL SHORTNESS OF BREATH  *UNUSUAL BRUISING OR BLEEDING  TENDERNESS IN MOUTH AND THROAT WITH OR WITHOUT PRESENCE OF ULCERS  *URINARY PROBLEMS  *BOWEL PROBLEMS  UNUSUAL RASH Items with * indicate a potential emergency and should be followed up as soon as possible.  Feel free to call the clinic you have any questions or concerns. The clinic phone number is [phone].

## 2015-01-08 ENCOUNTER — Ambulatory Visit (HOSPITAL_BASED_OUTPATIENT_CLINIC_OR_DEPARTMENT_OTHER): Payer: Medicare Other

## 2015-01-08 ENCOUNTER — Other Ambulatory Visit (HOSPITAL_BASED_OUTPATIENT_CLINIC_OR_DEPARTMENT_OTHER): Payer: Medicare Other

## 2015-01-08 DIAGNOSIS — Z5112 Encounter for antineoplastic immunotherapy: Secondary | ICD-10-CM

## 2015-01-08 DIAGNOSIS — Z86718 Personal history of other venous thrombosis and embolism: Secondary | ICD-10-CM

## 2015-01-08 DIAGNOSIS — I82401 Acute embolism and thrombosis of unspecified deep veins of right lower extremity: Secondary | ICD-10-CM

## 2015-01-08 DIAGNOSIS — C9000 Multiple myeloma not having achieved remission: Secondary | ICD-10-CM

## 2015-01-08 LAB — PROTIME-INR
INR: 1.2 — ABNORMAL LOW (ref 2.00–3.50)
Protime: 14.4 Seconds — ABNORMAL HIGH (ref 10.6–13.4)

## 2015-01-08 MED ORDER — ONDANSETRON HCL 8 MG PO TABS
ORAL_TABLET | ORAL | Status: AC
  Filled 2015-01-08: qty 1

## 2015-01-08 MED ORDER — BORTEZOMIB CHEMO SQ INJECTION 3.5 MG (2.5MG/ML)
1.3000 mg/m2 | Freq: Once | INTRAMUSCULAR | Status: AC
  Administered 2015-01-08: 2.5 mg via SUBCUTANEOUS
  Filled 2015-01-08: qty 2.5

## 2015-01-08 MED ORDER — ONDANSETRON HCL 8 MG PO TABS
8.0000 mg | ORAL_TABLET | Freq: Once | ORAL | Status: AC
  Administered 2015-01-08: 8 mg via ORAL

## 2015-01-08 MED ORDER — ACETAMINOPHEN 325 MG PO TABS
650.0000 mg | ORAL_TABLET | Freq: Once | ORAL | Status: AC
  Administered 2015-01-08: 650 mg via ORAL

## 2015-01-08 MED ORDER — ACETAMINOPHEN 325 MG PO TABS
ORAL_TABLET | ORAL | Status: AC
  Filled 2015-01-08: qty 2

## 2015-01-08 NOTE — Patient Instructions (Signed)
[place] Discharge Instructions for Patients Receiving Chemotherapy  Today you received the following chemotherapy agents velcade  To help prevent nausea and vomiting after your treatment, we encourage you to take your nausea medication as directed/prescribed    If you develop nausea and vomiting that is not controlled by your nausea medication, call the clinic.   BELOW ARE SYMPTOMS THAT SHOULD BE REPORTED IMMEDIATELY:  *FEVER GREATER THAN 100.5 F  *CHILLS WITH OR WITHOUT FEVER  NAUSEA AND VOMITING THAT IS NOT CONTROLLED WITH YOUR NAUSEA MEDICATION  *UNUSUAL SHORTNESS OF BREATH  *UNUSUAL BRUISING OR BLEEDING  TENDERNESS IN MOUTH AND THROAT WITH OR WITHOUT PRESENCE OF ULCERS  *URINARY PROBLEMS  *BOWEL PROBLEMS  UNUSUAL RASH Items with * indicate a potential emergency and should be followed up as soon as possible.  Feel free to call the clinic you have any questions or concerns. The clinic phone number is [phone].

## 2015-01-09 NOTE — Telephone Encounter (Addendum)
[name]  Please let patient know  D-dimer - normal D/w Magrinat - ok to stop anticouagulation from myeloma perspective   So please tell her to stop anticoagulation Return in 3 months to make sure dyspnea, fatigue, deconditioning ok Return soon if hemoptysis persists despite dc anticoagulation  Thanks  Dr. Brand Males, M.D., [facility].C.P Pulmonary and Critical Care Medicine Staff Physician [facility] Pager: [phone], If no answer or between  15:00h - 7:00h: call [phone]  01/09/2015 2:42 AM    For my use - message from Dr Jana Hakim Cherene Julian!   Myeloma in itself does not cause hypercoagulability-- it is the treatment, and in particular regimens including thalidomide or lenalidomide.   I'm taking Annissa off lenalidomide (and substituting cytoxan)-- so I think it's safe to stop her coumadin and I went ahead and set that up for this coming week (last dose 1/19)   Thanks!

## 2015-01-12 ENCOUNTER — Ambulatory Visit (HOSPITAL_BASED_OUTPATIENT_CLINIC_OR_DEPARTMENT_OTHER): Payer: Medicare Other | Admitting: Nurse Practitioner

## 2015-01-12 ENCOUNTER — Other Ambulatory Visit (HOSPITAL_BASED_OUTPATIENT_CLINIC_OR_DEPARTMENT_OTHER): Payer: Medicare Other

## 2015-01-12 ENCOUNTER — Ambulatory Visit (HOSPITAL_BASED_OUTPATIENT_CLINIC_OR_DEPARTMENT_OTHER): Payer: Medicare Other

## 2015-01-12 ENCOUNTER — Other Ambulatory Visit: Payer: Self-pay | Admitting: *Deleted

## 2015-01-12 DIAGNOSIS — J40 Bronchitis, not specified as acute or chronic: Secondary | ICD-10-CM

## 2015-01-12 DIAGNOSIS — C9000 Multiple myeloma not having achieved remission: Secondary | ICD-10-CM

## 2015-01-12 DIAGNOSIS — C9002 Multiple myeloma in relapse: Secondary | ICD-10-CM

## 2015-01-12 DIAGNOSIS — E860 Dehydration: Secondary | ICD-10-CM

## 2015-01-12 DIAGNOSIS — N19 Unspecified kidney failure: Secondary | ICD-10-CM

## 2015-01-12 DIAGNOSIS — Z5112 Encounter for antineoplastic immunotherapy: Secondary | ICD-10-CM

## 2015-01-12 DIAGNOSIS — R197 Diarrhea, unspecified: Secondary | ICD-10-CM

## 2015-01-12 DIAGNOSIS — J069 Acute upper respiratory infection, unspecified: Secondary | ICD-10-CM

## 2015-01-12 LAB — COMPREHENSIVE METABOLIC PANEL (CC13)
ALT: 14 U/L (ref 0–55)
ANION GAP: 12 meq/L — AB (ref 3–11)
AST: 19 U/L (ref 5–34)
Albumin: 2.3 g/dL — ABNORMAL LOW (ref 3.5–5.0)
Alkaline Phosphatase: 49 U/L (ref 40–150)
BUN: 13.4 mg/dL (ref 7.0–26.0)
CALCIUM: 8 mg/dL — AB (ref 8.4–10.4)
CO2: 22 mEq/L (ref 22–29)
Chloride: 103 mEq/L (ref 98–109)
Creatinine: 1 mg/dL (ref 0.6–1.1)
EGFR: 55 mL/min/{1.73_m2} — ABNORMAL LOW (ref >90)
Glucose: 183 mg/dl — ABNORMAL HIGH (ref 70–140)
Potassium: 4.4 mEq/L (ref 3.5–5.1)
SODIUM: 136 meq/L (ref 136–145)
Total Bilirubin: 0.43 mg/dL (ref 0.20–1.20)
Total Protein: 8.6 g/dL — ABNORMAL HIGH (ref 6.4–8.3)

## 2015-01-12 LAB — CBC WITH DIFFERENTIAL/PLATELET
BASO%: 0.6 % (ref 0.0–2.0)
Basophils Absolute: 0 10*3/uL (ref 0.0–0.1)
EOS%: 2.3 % (ref 0.0–7.0)
Eosinophils Absolute: 0.1 10*3/uL (ref 0.0–0.5)
HCT: 29.6 % — ABNORMAL LOW (ref 34.8–46.6)
HGB: 9.1 g/dL — ABNORMAL LOW (ref 11.6–15.9)
LYMPH#: 0.5 10*3/uL — AB (ref 0.9–3.3)
LYMPH%: 13.8 % — ABNORMAL LOW (ref 14.0–49.7)
MCH: 25.5 pg (ref 25.1–34.0)
MCHC: 30.7 g/dL — AB (ref 31.5–36.0)
MCV: 82.9 fL (ref 79.5–101.0)
MONO#: 0.3 10*3/uL (ref 0.1–0.9)
MONO%: 9 % (ref 0.0–14.0)
NEUT#: 2.6 10*3/uL (ref 1.5–6.5)
NEUT%: 74.3 % (ref 38.4–76.8)
PLATELETS: 223 10*3/uL (ref 145–400)
RBC: 3.57 10*6/uL — ABNORMAL LOW (ref 3.70–5.45)
RDW: 16.7 % — AB (ref 11.2–14.5)
WBC: 3.5 10*3/uL — ABNORMAL LOW (ref 3.9–10.3)

## 2015-01-12 MED ORDER — ONDANSETRON 8 MG/NS 50 ML IVPB
INTRAVENOUS | Status: AC
  Filled 2015-01-12: qty 8

## 2015-01-12 MED ORDER — ONDANSETRON 8 MG/50ML IVPB (CHCC)
8.0000 mg | Freq: Once | INTRAVENOUS | Status: AC
  Administered 2015-01-12: 8 mg via INTRAVENOUS

## 2015-01-12 MED ORDER — SODIUM CHLORIDE 0.9 % IV SOLN
INTRAVENOUS | Status: AC
  Administered 2015-01-12: 12:00:00 via INTRAVENOUS

## 2015-01-12 MED ORDER — BORTEZOMIB CHEMO SQ INJECTION 3.5 MG (2.5MG/ML)
1.3000 mg/m2 | Freq: Once | INTRAMUSCULAR | Status: AC
  Administered 2015-01-12: 2.5 mg via SUBCUTANEOUS
  Filled 2015-01-12: qty 2.5

## 2015-01-12 NOTE — Patient Instructions (Signed)
[facility] Discharge Instructions for Patients Receiving Chemotherapy  Today you received the following chemotherapy agents: Velcade.  To help prevent nausea and vomiting after your treatment, we encourage you to take your nausea medication as prescribed.   If you develop nausea and vomiting that is not controlled by your nausea medication, call the clinic.   BELOW ARE SYMPTOMS THAT SHOULD BE REPORTED IMMEDIATELY:  *FEVER GREATER THAN 100.5 F  *CHILLS WITH OR WITHOUT FEVER  NAUSEA AND VOMITING THAT IS NOT CONTROLLED WITH YOUR NAUSEA MEDICATION  *UNUSUAL SHORTNESS OF BREATH  *UNUSUAL BRUISING OR BLEEDING  TENDERNESS IN MOUTH AND THROAT WITH OR WITHOUT PRESENCE OF ULCERS  *URINARY PROBLEMS  *BOWEL PROBLEMS  UNUSUAL RASH Items with * indicate a potential emergency and should be followed up as soon as possible.  Feel free to call the clinic you have any questions or concerns. The clinic phone number is [phone].    

## 2015-01-13 ENCOUNTER — Telehealth: Payer: Self-pay | Admitting: *Deleted

## 2015-01-13 ENCOUNTER — Other Ambulatory Visit: Payer: Self-pay | Admitting: Nurse Practitioner

## 2015-01-13 ENCOUNTER — Encounter: Payer: Self-pay | Admitting: Nurse Practitioner

## 2015-01-13 ENCOUNTER — Encounter: Payer: Self-pay | Admitting: Cardiology

## 2015-01-13 ENCOUNTER — Ambulatory Visit (INDEPENDENT_AMBULATORY_CARE_PROVIDER_SITE_OTHER): Payer: Medicare Other | Admitting: Cardiology

## 2015-01-13 VITALS — BP 140/80 | HR 78 | Ht 63.0 in | Wt 172.0 lb

## 2015-01-13 DIAGNOSIS — I10 Essential (primary) hypertension: Secondary | ICD-10-CM

## 2015-01-13 DIAGNOSIS — R0600 Dyspnea, unspecified: Secondary | ICD-10-CM

## 2015-01-13 DIAGNOSIS — R059 Cough, unspecified: Secondary | ICD-10-CM

## 2015-01-13 DIAGNOSIS — I259 Chronic ischemic heart disease, unspecified: Secondary | ICD-10-CM

## 2015-01-13 DIAGNOSIS — J40 Bronchitis, not specified as acute or chronic: Secondary | ICD-10-CM

## 2015-01-13 DIAGNOSIS — R05 Cough: Secondary | ICD-10-CM | POA: Insufficient documentation

## 2015-01-13 MED ORDER — LEVOFLOXACIN 500 MG PO TABS: 500.0000 mg | ORAL_TABLET | Freq: Every day | ORAL | Status: DC

## 2015-01-13 NOTE — Progress Notes (Signed)
SYMPTOM MANAGEMENT CLINIC   HPI: [name] 69 y.o. female diagnosed with multiple myeloma; with bone metastasis.  Has been undergoing Revlimid/Velcade/dexamethasone therapy; as well as Zometa therapy on an every 12 week basis.  Scheduled to discontinue Revlimid; and initiate Cytoxan therapy.  Patient presented to the [facility] today to receive cycle 13, day 8 of her Velcade injection.  Patient has discontinued use of oral Revlimid: Was scheduled to initiate Cytoxan infusions today as well.  However, patient is complaining of new onset URI symptoms/bronchitis for the past several days.  She is complaining of a chronic cough and increased sinus congestion.  She denies any recent fever or chills.  She continues to complain of some chronic diarrhea; but stool for C. Difficile was negative on 01/05/2015.   HPI  CURRENT THERAPY: Upcoming Treatment Dates - MYELOMA INDUCTION NON-TRANSPLANT CANDIDATE Bortezomib SQ / Dexamethasone q21d Days with orders from any treatment category:  01/15/2015      SCHEDULING COMMUNICATION      ondansetron (ZOFRAN) tablet 8 mg      bortezomib SQ (VELCADE) chemo injection 2.5 mg      TREATMENT CONDITIONS      [facility] NURSING COMMUNICATION 01/19/2015      ondansetron (ZOFRAN) IVPB 8 mg      cyclophosphamide (CYTOXAN) 500 mg in sodium chloride 0.9 % 250 mL chemo infusion 01/27/2015      SCHEDULING COMMUNICATION      ondansetron (ZOFRAN) IVPB 8 mg      bortezomib SQ (VELCADE) chemo injection 2.5 mg      cyclophosphamide (CYTOXAN) 500 mg in sodium chloride 0.9 % 250 mL chemo infusion      TREATMENT CONDITIONS      [facility] NURSING COMMUNICATION    ROS  Past Medical History  Diagnosis Date  . Neurofibromatosis   . HTN (hypertension)   . Obesity   . Hyperlipidemia     statin intolerant. LDL is 83  . UTI (lower urinary tract infection) 05/29/12  . Pulmonary embolism 09/17/2013  . STEMI (ST elevation myocardial infarction) 05/29/2012    Inferolateral  STEMI ; s/p DES to RCA, nl EF  . Multiple myeloma     Dr. Jana Hakim   . DVT (deep venous thrombosis) 08/2014    RLE  . Type II diabetes mellitus   . History of blood transfusion ?2015    "blood count dropped real low"  . Osteoarthritis, knee   . Arthritis     "shoulders hurt" (09/30/2014)    Past Surgical History  Procedure Laterality Date  . Clavicle surgery Left ~ 1960  . Ankle fracture surgery Right ?1990's  . Total knee arthroplasty Left 09/15/2013    Procedure: TOTAL KNEE ARTHROPLASTY;  Surgeon: Vickey Huger, MD;  Location: [facility];  Service: Orthopedics;  Laterality: Left;  . Hernia repair    . Coronary angioplasty with stent placement  05/2012    "1"  . Tonsillectomy and adenoidectomy  1950's  . Fracture surgery    . Bone marrow biopsy  03/2014    Dr. Jana Hakim   . Vaginal hysterectomy  1990's  . Left heart catheterization with coronary angiogram N/A 05/29/2012    Procedure: LEFT HEART CATHETERIZATION WITH CORONARY ANGIOGRAM;  Surgeon: Jettie Booze, MD;  Location: [facility] CATH LAB;  Service: Cardiovascular;  Laterality: N/A;    has Neurofibromatosis; Type II or unspecified type diabetes mellitus without mention of complication, uncontrolled; HTN (hypertension); Obesity; Osteoarthritis, knee; Fatigue; Chest pain at rest; Acute sinusitis, unspecified; Herpes zoster;  Dyspnea; STEMI (ST elevation myocardial infarction); Benign hypertensive heart disease without heart failure; Onychomycosis; Pain in joint, ankle and foot; Acute pulmonary embolism; Hypoxemia; Pulmonary embolism; Long term current use of anticoagulant therapy; Fe deficiency anemia; Encounter for therapeutic drug monitoring; Pneumonia, organism unspecified; Hypercalcemia; Major hemoptysis; Acute blood loss anemia; Vertebral fracture, closed; Physical deconditioning; Multiple myeloma in relapse; Epistaxis; Leg pain, left; Pain in lower limb; Dysuria; Labial irritation; DVT (deep venous thrombosis), right; Diarrhea; Pulmonary  hemorrhage; Hemoptysis; Diabetes mellitus type 2, controlled; Rash; Bronchitis; Multiple myeloma; and Cough on her problem list.    is allergic to codeine; hydrocodone; niaspan; robaxin; statins; tetracycline; zetia; and zithromax.    Medication List       This list is accurate as of: 01/12/15 11:59 PM.  Always use your most recent med list.               acetaminophen 325 MG tablet  Commonly known as:  TYLENOL  Take 1-2 tablets (325-650 mg total) by mouth every 4 (four) hours as needed for mild pain.     cetirizine 10 MG tablet  Commonly known as:  ZYRTEC  Take 10 mg by mouth daily as needed.     cholestyramine 4 G packet  Commonly known as:  QUESTRAN  Take 1 packet (4 g total) by mouth 2 (two) times daily.     clindamycin 1 % gel  Commonly known as:  CLINDAGEL  Apply topically 2 (two) times daily.     dexamethasone 4 MG tablet  Commonly known as:  DECADRON  TAKE 5 TABLETS BY MOUTH ON TUESDAY AND WEDNESDAY ONLY     erythromycin ophthalmic ointment     glimepiride 2 MG tablet  Commonly known as:  AMARYL  - Take 2 tablets by mouth every morning with breakfast.   - On the days that you take prednisone or have chemotherapy take another dose at the evening meal     glucose blood test strip  Commonly known as:  ONE TOUCH ULTRA TEST  1 each by Other route 3 (three) times daily.     Lancets Misc  Check glucose 3 times daily     lenalidomide 10 MG capsule  Commonly known as:  REVLIMID  Take 1 capsule (10 mg total) by mouth daily.     metoprolol tartrate 25 MG tablet  Commonly known as:  LOPRESSOR  Take 25 mg by mouth 2 (two) times daily.     multivitamin with minerals Tabs tablet  Take 1 tablet by mouth at bedtime. Centrum Silver     NITROSTAT 0.4 MG SL tablet  Generic drug:  nitroGLYCERIN  DISSOLVE 1 TABLET UNDER THE TONGUE EVERY 5 MINUTES AS NEEDED FOR CHEST PAIN. UP TO 3 DOSES     nystatin 100000 UNIT/GM Powd  Apply 1 g topically 3 (three) times daily as  needed.     OCUVITE PO  Take 1 tablet by mouth daily.     polyethylene glycol packet  Commonly known as:  MIRALAX / GLYCOLAX  Take 17 g by mouth daily.     prochlorperazine 10 MG tablet  Commonly known as:  COMPAZINE  Take 1 tablet (10 mg total) by mouth every 6 (six) hours as needed (Nausea or vomiting).     promethazine 50 MG tablet  Commonly known as:  PHENERGAN     traMADol 50 MG tablet  Commonly known as:  ULTRAM  Take 1 tablet (50 mg total) by mouth every 6 (six) hours as needed for  moderate pain.         PHYSICAL EXAMINATION  Vitals: BP 137/77, HR 89, temp 97.5, sat 99  Physical Exam  Constitutional: She is oriented to person, place, and time. Vital signs are normal. She appears unhealthy.  HENT:  Head: Normocephalic and atraumatic.  Mouth/Throat: Oropharynx is clear and moist.  Moderate nasal congestion on exam; but no facial tenderness with palp. Oropharynx clear with no exudate.  Eyes: Conjunctivae and EOM are normal. Pupils are equal, round, and reactive to light. Right eye exhibits no discharge. Left eye exhibits no discharge. No scleral icterus.  Neck: Normal range of motion. Neck supple. No JVD present. No tracheal deviation present. No thyromegaly present.  Cardiovascular: Normal rate, regular rhythm, normal heart sounds and intact distal pulses.   Pulmonary/Chest: Effort normal and breath sounds normal. No respiratory distress. She has no wheezes. She has no rales. She exhibits no tenderness.  Abdominal: Soft. Bowel sounds are normal. She exhibits no distension and no mass. There is no tenderness. There is no rebound and no guarding.  Musculoskeletal: Normal range of motion. She exhibits no edema or tenderness.  Lymphadenopathy:    She has no cervical adenopathy.  Neurological: She is alert and oriented to person, place, and time. Gait normal.  Skin: Skin is warm and dry. No rash noted. No erythema. There is pallor.  Psychiatric: Affect normal.  Nursing  note and vitals reviewed.   LABORATORY DATA:. Appointment on 01/12/2015  Component Date Value Ref Range Status  . WBC 01/12/2015 3.5* 3.9 - 10.3 10e3/uL Final  . NEUT# 01/12/2015 2.6  1.5 - 6.5 10e3/uL Final  . HGB 01/12/2015 9.1* 11.6 - 15.9 g/dL Final  . HCT 01/12/2015 29.6* 34.8 - 46.6 % Final  . Platelets 01/12/2015 223  145 - 400 10e3/uL Final  . MCV 01/12/2015 82.9  79.5 - 101.0 fL Final  . MCH 01/12/2015 25.5  25.1 - 34.0 pg Final  . MCHC 01/12/2015 30.7* 31.5 - 36.0 g/dL Final  . RBC 01/12/2015 3.57* 3.70 - 5.45 10e6/uL Final  . RDW 01/12/2015 16.7* 11.2 - 14.5 % Final  . lymph# 01/12/2015 0.5* 0.9 - 3.3 10e3/uL Final  . MONO# 01/12/2015 0.3  0.1 - 0.9 10e3/uL Final  . Eosinophils Absolute 01/12/2015 0.1  0.0 - 0.5 10e3/uL Final  . Basophils Absolute 01/12/2015 0.0  0.0 - 0.1 10e3/uL Final  . NEUT% 01/12/2015 74.3  38.4 - 76.8 % Final  . LYMPH% 01/12/2015 13.8* 14.0 - 49.7 % Final  . MONO% 01/12/2015 9.0  0.0 - 14.0 % Final  . EOS% 01/12/2015 2.3  0.0 - 7.0 % Final  . BASO% 01/12/2015 0.6  0.0 - 2.0 % Final  . Sodium 01/12/2015 136  136 - 145 mEq/L Final  . Potassium 01/12/2015 4.4  3.5 - 5.1 mEq/L Final  . Chloride 01/12/2015 103  98 - 109 mEq/L Final  . CO2 01/12/2015 22  22 - 29 mEq/L Final  . Glucose 01/12/2015 183* 70 - 140 mg/dl Final  . BUN 01/12/2015 13.4  7.0 - 26.0 mg/dL Final  . Creatinine 01/12/2015 1.0  0.6 - 1.1 mg/dL Final  . Total Bilirubin 01/12/2015 0.43  0.20 - 1.20 mg/dL Final  . Alkaline Phosphatase 01/12/2015 49  40 - 150 U/L Final  . AST 01/12/2015 19  5 - 34 U/L Final  . ALT 01/12/2015 14  0 - 55 U/L Final  . Total Protein 01/12/2015 8.6* 6.4 - 8.3 g/dL Final  . Albumin 01/12/2015 2.3* 3.5 -  5.0 g/dL Final  . Calcium 01/12/2015 8.0* 8.4 - 10.4 mg/dL Final  . Anion Gap 01/12/2015 12* 3 - 11 mEq/L Final  . EGFR 01/12/2015 55* >90 ml/min/1.73 m2 Final   eGFR is calculated using the CKD-EPI Creatinine Equation (2009)     RADIOGRAPHIC STUDIES:  No results found.  ASSESSMENT/PLAN:    Bronchitis Pt has developed an URI and probable bronchitis within past several days. Pt with clear breath sounds; but significant nasal congestion and occasional cough. No acute respiratory distress. Pt is afebrile today. Will prescribe Levaquin antibiotics.    Diarrhea Pt continues with intermittent diarrhea.  Stool for C-diff was negative on 01/05/15.  Pt advised to continue Imodium as directed.    Multiple myeloma in relapse Pt will proceed with cycle 13, day 8 of her Velcade injection today. She has discontinued the Revlimid; and was scheduled to initiate cytoxan today- but will hold cytoxan due to URI infection.  She will need to be rescheduled for initiation of cytoxan for next week.   Pt last received Zometa on 12/25/14. This will continue on an every 3 month basis. Will next be due 03/26/15.    Pt scheduled to return 01/15/15 for labs and her next velcade injection.    Patient stated understanding of all instructions; and was in agreement with this plan of care. The patient knows to call the clinic with any problems, questions or concerns.   Review/collaboration with Dr. Jana Hakim regarding all aspects of patient's visit today.   Total time spent with patient was 25 minutes;  with greater than 75 percent of that time spent in face to face counseling regarding her symptoms, and coordination of care and follow up.  Disclaimer: This note was dictated with voice recognition software. Similar sounding words can inadvertently be transcribed and may not be corrected upon review.   Drue Second, NP 01/13/2015

## 2015-01-13 NOTE — Telephone Encounter (Signed)
lmtcb for pt on home and cell numbers.

## 2015-01-13 NOTE — Patient Instructions (Signed)
[facility] [place]   Your physician recommends that you schedule a follow-up appointment in: [place]

## 2015-01-13 NOTE — Telephone Encounter (Addendum)
Patient called reporting she was to have an antibiotic called in yesterday.  Her Pharmacy has not received an order.  "I also am still having diarrhea, I had five stools last night after I went to bed.  None this morning."  Also would like [name] to know she got sick when she tool the Levaquin in  December.  This nurse noted history of Levaquin as inpatient and ambulatory orders.  Will notify [employer] NO.  Patient uses [facility] at [facility].  Can call home number [phone] and "Okay to leave a message because I have an appointment at 1000 with cardiologist Dr. Mare Ferrari.

## 2015-01-13 NOTE — Assessment & Plan Note (Signed)
Pt has developed an URI and probable bronchitis within past several days. Pt with clear breath sounds; but significant nasal congestion and occasional cough. No acute respiratory distress. Pt is afebrile today. Will prescribe Levaquin antibiotics.

## 2015-01-13 NOTE — Progress Notes (Signed)
Cardiology Office Note   Date:  01/13/2015   ID:  KEYARRA [name], DOB [birth year], MRN [id number]  PCP:  Sue Reichmann, MD  Cardiologist:   Sue Coco, MD   No chief complaint on file.     History of Present Illness: [name] is a 69 y.o. female who presents for follow-up office visit  This pleasant 69 year old woman is seen for a scheduled followup office visit. Since we last saw her she has been diagnosed with multiple myeloma. Dr. Griffith Taylor is her oncologist. She is on chemotherapy.  She had been on Revlimid until January 17 when it was stopped.  She is not sure what they will try next.  She has a prior history of known ischemic heart disease. On 05/29/12 she presented with acute chest pain and had a STEMI and was taken to the Cath Lab by Dr. Irish Taylor who placed a drug-eluting stent into an occluded right coronary artery. She was on dual antiplatelet therapy with Brilinta and aspirin for more than one year. Her Brilinta was stopped in anticipation of recent left total knee surgery and she is continued on aspirin The patient also has a history of hypercholesterolemia, diabetes, and neurofibromatosis. The patient underwent left total knee replacement by Dr. Ronnie Derby on 09/15/13. 2 days later she had acute dyspnea and was found to have pulmonary emboli on 09/17/13. Venous duplex of her lower extremities was negative for deep vein thrombosis. She was treated with Coumadin but it had to be stopped because of severe intractable epistaxis.Her Coumadin was later restarted.  She took Coumadin for a year and is now off Coumadin again.  She is back on a baby aspirin.  She has not had any recurrent epistaxis. Last year she was admitted with back pain and sciatica and was found to have multiple myeloma.  Past Medical History  Diagnosis Date  . Neurofibromatosis   . HTN (hypertension)   . Obesity   . Hyperlipidemia     statin intolerant. LDL is 83  . UTI (lower urinary tract  infection) 05/29/12  . Pulmonary embolism 09/17/2013  . STEMI (ST elevation myocardial infarction) 05/29/2012    Inferolateral STEMI ; s/p DES to RCA, nl EF  . Multiple myeloma     Dr. Jana Taylor   . DVT (deep venous thrombosis) 08/2014    RLE  . Type II diabetes mellitus   . History of blood transfusion ?2015    "blood count dropped real low"  . Osteoarthritis, knee   . Arthritis     "shoulders hurt" (09/30/2014)    Past Surgical History  Procedure Laterality Date  . Clavicle surgery Left ~ 1960  . Ankle fracture surgery Right ?1990's  . Total knee arthroplasty Left 09/15/2013    Procedure: TOTAL KNEE ARTHROPLASTY;  Surgeon: Vickey Huger, MD;  Location: [facility];  Service: Orthopedics;  Laterality: Left;  . Hernia repair    . Coronary angioplasty with stent placement  05/2012    "1"  . Tonsillectomy and adenoidectomy  1950's  . Fracture surgery    . Bone marrow biopsy  03/2014    Dr. Jana Taylor   . Vaginal hysterectomy  1990's  . Left heart catheterization with coronary angiogram N/A 05/29/2012    Procedure: LEFT HEART CATHETERIZATION WITH CORONARY ANGIOGRAM;  Surgeon: Sue Booze, MD;  Location: [facility] CATH LAB;  Service: Cardiovascular;  Laterality: N/A;     Current Outpatient Prescriptions  Medication Sig Dispense Refill  . acetaminophen (TYLENOL) 325 MG tablet Take  1-2 tablets (325-650 mg total) by mouth every 4 (four) hours as needed for mild pain.    [name] aspirin 81 MG tablet Take 81 mg by mouth daily.    . cetirizine (ZYRTEC) 10 MG tablet Take 10 mg by mouth daily as needed.     . cholestyramine (QUESTRAN) 4 G packet Take 1 packet (4 g total) by mouth 2 (two) times daily. 60 each 12  . clindamycin (CLINDAGEL) 1 % gel Apply topically 2 (two) times daily. 30 g 0  . dexamethasone (DECADRON) 4 MG tablet TAKE 5 TABLETS BY MOUTH ON TUESDAY AND WEDNESDAY ONLY 45 tablet 0  . erythromycin ophthalmic ointment   2  . glimepiride (AMARYL) 2 MG tablet Take 2 tablets by mouth every morning  with breakfast.  On the days that you take prednisone or have chemotherapy take another dose at the evening meal 90 tablet 1  . glucose blood (ONE TOUCH ULTRA TEST) test strip 1 each by Other route 3 (three) times daily. 100 each 12  . guaiFENesin (MUCINEX) 600 MG 12 hr tablet Take by mouth 2 (two) times daily as needed.    . Lancets MISC Check glucose 3 times daily 100 each 6  . levofloxacin (LEVAQUIN) 500 MG tablet Take 1 tablet (500 mg total) by mouth daily. 7 tablet 0  . metoprolol tartrate (LOPRESSOR) 25 MG tablet Take 25 mg by mouth 2 (two) times daily.    . Multiple Vitamin (MULTIVITAMIN WITH MINERALS) TABS tablet Take 1 tablet by mouth at bedtime. Centrum Silver    . Multiple Vitamins-Minerals (OCUVITE PO) Take 1 tablet by mouth daily.     [name] NITROSTAT 0.4 MG SL tablet DISSOLVE 1 TABLET UNDER THE TONGUE EVERY 5 MINUTES AS NEEDED FOR CHEST PAIN. UP TO 3 DOSES 25 tablet 0  . nystatin (MYCOSTATIN/NYSTOP) 100000 UNIT/GM POWD Apply 1 g topically 3 (three) times daily as needed. 30 g 1  . polyethylene glycol (MIRALAX / GLYCOLAX) packet Take 17 g by mouth daily.    . prochlorperazine (COMPAZINE) 10 MG tablet Take 1 tablet (10 mg total) by mouth every 6 (six) hours as needed (Nausea or vomiting). 90 tablet 4  . promethazine (PHENERGAN) 50 MG tablet   0  . traMADol (ULTRAM) 50 MG tablet Take 1 tablet (50 mg total) by mouth every 6 (six) hours as needed for moderate pain. 30 tablet 2   No current facility-administered medications for this visit.    Allergies:   Codeine; Hydrocodone; Niaspan; Robaxin; Statins; Tetracycline; Zetia; and Zithromax    Social History:  The patient  reports that she has never smoked. She has never used smokeless tobacco. She reports that she does not drink alcohol or use illicit drugs.   Family History:  The patient's family history includes Arthritis in her mother; Heart disease in her mother and son; Stroke in her mother.    ROS:  Please see the history of present  illness.   Otherwise, review of systems are positive for recent cough productive of generally clear sputum.  No chills or fever.  Recent chest x-ray clear and exam today does not reveal any rales or rhonchi.  The patient diabetic.  She has had occasional mild hypoglycemic episodes often at night.  Her most recent A1c was 6.2.  Her diabetes is followed by her PCP.   All other systems are reviewed and negative.    PHYSICAL EXAM: VS:  BP 140/80 mmHg  Pulse 78  Ht $R'5\' 3"'mz$  (1.6 m)  Wt  172 lb (78.019 kg)  BMI 30.48 kg/m2  SpO2 98% , BMI Body mass index is 30.48 kg/(m^2). GEN: Well nourished, well developed, in no acute distress HEENT: normal Neck: no JVD, carotid bruits, or masses Cardiac: RRR; no murmurs, rubs, or gallops,no edema  Respiratory:  clear to auscultation bilaterally, normal work of breathing GI: soft, nontender, nondistended, + BS MS: no deformity or atrophy Skin: warm and dry, no rash Neuro:  Strength and sensation are intact Psych: euthymic mood, full affect   EKG:  EKG is not ordered today. Recent EKG of 09/09/14 was reviewed and showed no change from prior tracings.  She is in normal sinus rhythm.    Recent Labs: 03/25/2014: TSH 0.889 03/26/2014: Magnesium 1.9 01/12/2015: ALT 14; BUN 13.4; Creatinine 1.0; Hemoglobin 9.1*; Platelets 223; Potassium 4.4; Sodium 136    Lipid Panel    Component Value Date/Time   CHOL 165 12/09/2013 1000   TRIG 90 12/09/2013 1000   HDL 44 12/09/2013 1000   CHOLHDL 3.8 12/09/2013 1000   VLDL 18 12/09/2013 1000   LDLCALC 103* 12/09/2013 1000   LDLDIRECT 147.9 01/21/2013 1144      Wt Readings from Last 3 Encounters:  01/13/15 172 lb (78.019 kg)  01/01/15 172 lb 3.2 oz (78.109 kg)  12/31/14 172 lb (78.019 kg)      Other studies Reviewed: Additional studies/ records that were reviewed today include: Prior EKGs Review of the above records demonstrates: Normal sinus rhythm   ASSESSMENT AND PLAN:  1. ischemic heart disease status  post acute inferior wall myocardial infarction on 05/29/12 requiring a drug-eluting stent. 2. weight loss secondary to multiple myeloma.  Weight loss has now leveled off. 3. diabetes mellitus 4. 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 previous postoperative pulmonary embolism after knee surgery in October 2014. 6.  Intolerant of statins  Plan: Continue same medication. Recheck in 4 months for office visit and EKG   Current medicines are reviewed at length with the patient today.  The patient does not have concerns regarding medicines.  The following changes have been made:  no change  Labs/ tests ordered today include:  No orders of the defined types were placed in this encounter.     Disposition:   FU with Dr. Mare Ferrari in 4 months for office visit and EKG.   Signed, Sue Coco, MD  01/13/2015 2:01 PM    [facility] [address], [place], [place]  [address] Phone: [phone]; Fax: [phone]

## 2015-01-13 NOTE — Assessment & Plan Note (Signed)
Pt continues with intermittent diarrhea.  Stool for C-diff was negative on 01/05/15.  Pt advised to continue Imodium as directed.

## 2015-01-13 NOTE — Assessment & Plan Note (Signed)
Pt will proceed with cycle 13, day 8 of her Velcade injection today. She has discontinued the Revlimid; and was scheduled to initiate cytoxan today- but will hold cytoxan due to URI infection.  She will need to be rescheduled for initiation of cytoxan for next week.   Pt last received Zometa on 12/25/14. This will continue on an every 3 month basis. Will next be due 03/26/15.    Pt scheduled to return 01/15/15 for labs and her next velcade injection.

## 2015-01-13 NOTE — Telephone Encounter (Signed)
Spoke with pt, aware of rec's per MR.  Appt scheduled with MR for 02/17/15 at 2:15.  Pt aware that if symptoms increase between now and that scheduled appt she is to call us.  Nothing further needed.

## 2015-01-15 ENCOUNTER — Ambulatory Visit (HOSPITAL_BASED_OUTPATIENT_CLINIC_OR_DEPARTMENT_OTHER): Payer: Medicare Other

## 2015-01-15 ENCOUNTER — Other Ambulatory Visit (HOSPITAL_BASED_OUTPATIENT_CLINIC_OR_DEPARTMENT_OTHER): Payer: Medicare Other

## 2015-01-15 DIAGNOSIS — Z5112 Encounter for antineoplastic immunotherapy: Secondary | ICD-10-CM

## 2015-01-15 DIAGNOSIS — C9000 Multiple myeloma not having achieved remission: Secondary | ICD-10-CM

## 2015-01-15 DIAGNOSIS — I82401 Acute embolism and thrombosis of unspecified deep veins of right lower extremity: Secondary | ICD-10-CM

## 2015-01-15 LAB — PROTIME-INR
INR: 1 — ABNORMAL LOW (ref 2.00–3.50)
PROTIME: 12 s (ref 10.6–13.4)

## 2015-01-15 MED ORDER — BORTEZOMIB CHEMO SQ INJECTION 3.5 MG (2.5MG/ML)
1.3000 mg/m2 | Freq: Once | INTRAMUSCULAR | Status: AC
  Administered 2015-01-15: 2.5 mg via SUBCUTANEOUS
  Filled 2015-01-15: qty 2.5

## 2015-01-15 MED ORDER — ONDANSETRON HCL 8 MG PO TABS
ORAL_TABLET | ORAL | Status: AC
  Filled 2015-01-15: qty 1

## 2015-01-15 MED ORDER — ONDANSETRON HCL 8 MG PO TABS
8.0000 mg | ORAL_TABLET | Freq: Once | ORAL | Status: AC
  Administered 2015-01-15: 8 mg via ORAL

## 2015-01-15 NOTE — Progress Notes (Signed)
Patient reports feeling better today and is taking levaquin that was prescribed by Selena Lesser NP earlier this week. No issues or concerns today.

## 2015-01-15 NOTE — Patient Instructions (Signed)
[facility] Discharge Instructions for Patients Receiving Chemotherapy  Today you received the following chemotherapy agents: Velcade  To help prevent nausea and vomiting after your treatment, we encourage you to take your nausea medication as prescribed by your physician.   If you develop nausea and vomiting that is not controlled by your nausea medication, call the clinic.   BELOW ARE SYMPTOMS THAT SHOULD BE REPORTED IMMEDIATELY:  *FEVER GREATER THAN 100.5 F  *CHILLS WITH OR WITHOUT FEVER  NAUSEA AND VOMITING THAT IS NOT CONTROLLED WITH YOUR NAUSEA MEDICATION  *UNUSUAL SHORTNESS OF BREATH  *UNUSUAL BRUISING OR BLEEDING  TENDERNESS IN MOUTH AND THROAT WITH OR WITHOUT PRESENCE OF ULCERS  *URINARY PROBLEMS  *BOWEL PROBLEMS  UNUSUAL RASH Items with * indicate a potential emergency and should be followed up as soon as possible.  Feel free to call the clinic you have any questions or concerns. The clinic phone number is [phone].

## 2015-01-19 ENCOUNTER — Ambulatory Visit (HOSPITAL_BASED_OUTPATIENT_CLINIC_OR_DEPARTMENT_OTHER): Payer: Medicare Other

## 2015-01-19 ENCOUNTER — Telehealth: Payer: Self-pay | Admitting: *Deleted

## 2015-01-19 ENCOUNTER — Other Ambulatory Visit (HOSPITAL_BASED_OUTPATIENT_CLINIC_OR_DEPARTMENT_OTHER): Payer: Medicare Other

## 2015-01-19 VITALS — BP 128/76 | HR 76 | Temp 97.0°F | Resp 20

## 2015-01-19 DIAGNOSIS — Z5111 Encounter for antineoplastic chemotherapy: Secondary | ICD-10-CM

## 2015-01-19 DIAGNOSIS — C9002 Multiple myeloma in relapse: Secondary | ICD-10-CM

## 2015-01-19 DIAGNOSIS — I82401 Acute embolism and thrombosis of unspecified deep veins of right lower extremity: Secondary | ICD-10-CM

## 2015-01-19 DIAGNOSIS — C9000 Multiple myeloma not having achieved remission: Secondary | ICD-10-CM

## 2015-01-19 LAB — PROTIME-INR
INR: 1 — ABNORMAL LOW (ref 2.00–3.50)
Protime: 12 Seconds (ref 10.6–13.4)

## 2015-01-19 LAB — CBC WITH DIFFERENTIAL/PLATELET
BASO%: 0.8 % (ref 0.0–2.0)
Basophils Absolute: 0 10*3/uL (ref 0.0–0.1)
EOS%: 0.7 % (ref 0.0–7.0)
Eosinophils Absolute: 0 10*3/uL (ref 0.0–0.5)
HCT: 31 % — ABNORMAL LOW (ref 34.8–46.6)
HEMOGLOBIN: 9.6 g/dL — AB (ref 11.6–15.9)
LYMPH%: 7.1 % — ABNORMAL LOW (ref 14.0–49.7)
MCH: 25 pg — ABNORMAL LOW (ref 25.1–34.0)
MCHC: 31 g/dL — ABNORMAL LOW (ref 31.5–36.0)
MCV: 80.5 fL (ref 79.5–101.0)
MONO#: 0.3 10*3/uL (ref 0.1–0.9)
MONO%: 5.6 % (ref 0.0–14.0)
NEUT#: 5.1 10*3/uL (ref 1.5–6.5)
NEUT%: 85.8 % — ABNORMAL HIGH (ref 38.4–76.8)
Platelets: 182 10*3/uL (ref 145–400)
RBC: 3.85 10*6/uL (ref 3.70–5.45)
RDW: 18.3 % — ABNORMAL HIGH (ref 11.2–14.5)
WBC: 5.9 10*3/uL (ref 3.9–10.3)
lymph#: 0.4 10*3/uL — ABNORMAL LOW (ref 0.9–3.3)

## 2015-01-19 MED ORDER — SODIUM CHLORIDE 0.9 % IV SOLN: Freq: Once | INTRAVENOUS | Status: DC

## 2015-01-19 MED ORDER — CYCLOPHOSPHAMIDE CHEMO INJECTION 1 GM
500.0000 mg | Freq: Once | INTRAMUSCULAR | Status: AC
  Administered 2015-01-19: 500 mg via INTRAVENOUS
  Filled 2015-01-19: qty 25

## 2015-01-19 MED ORDER — ONDANSETRON 8 MG/50ML IVPB (CHCC)
8.0000 mg | Freq: Once | INTRAVENOUS | Status: AC
  Administered 2015-01-19: 8 mg via INTRAVENOUS

## 2015-01-19 MED ORDER — ONDANSETRON 8 MG/NS 50 ML IVPB
INTRAVENOUS | Status: AC
  Filled 2015-01-19: qty 8

## 2015-01-19 NOTE — Progress Notes (Signed)
Quick Note:  Called and spoke to pt. Informed pt of the results and recs per MR. Pt verbalized understanding and denied any further questions or concerns a this time. ______

## 2015-01-19 NOTE — Telephone Encounter (Signed)
PT. TO BRING THE LETTER FOR [employer] TO THIS OFFICE. SHE VOICES UNDERSTANDING

## 2015-01-19 NOTE — Patient Instructions (Signed)
[facility] Discharge Instructions for Patients Receiving Chemotherapy  Today you received the following chemotherapy agents: Cytoxan.   To help prevent nausea and vomiting after your treatment, we encourage you to take your nausea medication as directed.    If you develop nausea and vomiting that is not controlled by your nausea medication, call the clinic.   BELOW ARE SYMPTOMS THAT SHOULD BE REPORTED IMMEDIATELY:  *FEVER GREATER THAN 100.5 F  *CHILLS WITH OR WITHOUT FEVER  NAUSEA AND VOMITING THAT IS NOT CONTROLLED WITH YOUR NAUSEA MEDICATION  *UNUSUAL SHORTNESS OF BREATH  *UNUSUAL BRUISING OR BLEEDING  TENDERNESS IN MOUTH AND THROAT WITH OR WITHOUT PRESENCE OF ULCERS  *URINARY PROBLEMS  *BOWEL PROBLEMS  UNUSUAL RASH Items with * indicate a potential emergency and should be followed up as soon as possible.  Feel free to call the clinic you have any questions or concerns. The clinic phone number is [phone].    

## 2015-01-21 ENCOUNTER — Ambulatory Visit (INDEPENDENT_AMBULATORY_CARE_PROVIDER_SITE_OTHER): Payer: Medicare Other | Admitting: Emergency Medicine

## 2015-01-21 ENCOUNTER — Encounter: Payer: Self-pay | Admitting: Emergency Medicine

## 2015-01-21 VITALS — BP 98/68 | HR 88 | Temp 97.6°F | Resp 16

## 2015-01-21 DIAGNOSIS — I259 Chronic ischemic heart disease, unspecified: Secondary | ICD-10-CM

## 2015-01-21 DIAGNOSIS — L918 Other hypertrophic disorders of the skin: Secondary | ICD-10-CM

## 2015-01-21 DIAGNOSIS — I493 Ventricular premature depolarization: Secondary | ICD-10-CM

## 2015-01-21 DIAGNOSIS — E119 Type 2 diabetes mellitus without complications: Secondary | ICD-10-CM

## 2015-01-21 LAB — POCT GLYCOSYLATED HEMOGLOBIN (HGB A1C): HEMOGLOBIN A1C: 5.8

## 2015-01-21 NOTE — Patient Instructions (Addendum)
-   Please decrease your diabetes medication to one pill a day. - On days that you have chemo treatment go back to two pills a day. - Keep a diabetic friendly diet, check your feet daily, continue checking blood sugar. - For the skin tag removal, please keep wound clean and dry, remove bandage tomorrow.

## 2015-01-21 NOTE — Progress Notes (Signed)
MRN: [id number] DOB: 10-Sep-1946  Subjective:   [name] is a 69 y.o. female presenting for 3 month follow up on diabetes.  Specialists: Pulmonologist - Dr. Vevelyn Royals, visit next week, last visit in 12/2014, no significant findings Oncologist - Dr. Vinie Sill, visit last week, no significant findings Cardiologist - Dr. Mare Ferrari, last week, stable, no significant findings  Taking Glimepiride twice a day. Blood sugar ranges 110-125, checks 3 times a day, has had levels in the 50's-60's in the evening, eats snacks which help. Steroids can bump up her blood sugar up to ~300 on days she has her oncology treatment. Denies any foot issues including wounds, cuts, numbness and tingling. Checks feet daily, cleans them regularly. Has follow up eye exam on 03/04/2015 with Dr. Katy Fitch.   Skin lesion - complains of skin lesion over her right back, states that it is catching on her bra, sometimes bleeds and irritates her. Denies discharge, discoloration of the lesion, rashes elsewhere. Denies any other aggravating or relieving factors, no other questions or concerns.  [name] has a current medication list which includes the following prescription(s): acetaminophen, aspirin, cetirizine, clindamycin, dexamethasone, glimepiride, glucose blood, guaifenesin, lancets, metoprolol tartrate, multivitamin with minerals, multiple vitamins-minerals, nitrostat, polyethylene glycol, prochlorperazine, promethazine, tramadol, cholestyramine, erythromycin, and nystatin.  She is allergic to codeine; hydrocodone; niaspan; robaxin; statins; tetracycline; zetia; and zithromax.  [name]  has a past medical history of Neurofibromatosis; HTN (hypertension); Obesity; Hyperlipidemia; UTI (lower urinary tract infection) (05/29/12); Pulmonary embolism (09/17/2013); STEMI (ST elevation myocardial infarction) (05/29/2012); Multiple myeloma; DVT (deep venous thrombosis) (08/2014); Type II diabetes mellitus; History of blood transfusion  (?2015); Osteoarthritis, knee; and Arthritis. Also  has past surgical history that includes Clavicle surgery (Left, ~ 1960); Ankle fracture surgery (Right, ?1990's); Total knee arthroplasty (Left, 09/15/2013); Hernia repair; Coronary angioplasty with stent (05/2012); Tonsillectomy and adenoidectomy (1950's); Fracture surgery; Bone marrow biopsy (03/2014); Vaginal hysterectomy (1990's); and left heart catheterization with coronary angiogram (N/A, 05/29/2012).  ROS As in subjective.  Objective:   Vitals: BP 98/68 mmHg  Pulse 88  Temp(Src) 97.6 F (36.4 C) (Oral)  Resp 16  Ht   SpO2 100%  Physical Exam  Constitutional: She is oriented to person, place, and time.  Cardiovascular: Normal rate and intact distal pulses.  Exam reveals no gallop and no friction rub.   No murmur heard. Several premature beats.  Pulmonary/Chest: Effort normal and breath sounds normal. No respiratory distress. She has no wheezes. She has no rales. She exhibits no tenderness.  Neurological: She is alert and oriented to person, place, and time.  Skin: Skin is warm and dry. No rash noted. No erythema.  Posterior right back near costovertebral angle with 1 skin lesion <0.5cm.    Diabetic foot exam:  Sensation intact, strength 5/5, posterior tibial, dorsalis pedis 2+, symmetric, no ulcerations, wounds.  Skin tag removal: Patient verbalized consent. Denies allergies to lidocaine and iodine. Site cleansed with betadine swab. Local anaesthesia with 0.5cc of 2% lidocaine used. Skin tag removed using Adson and Iris scissors. Hemostasis achieved using cautery with #2 silver nitrate. Site cleansed and dressed. Patient tolerated procedure well.  Results for orders placed or performed in visit on 01/21/15 (from the past 24 hour(s))  POCT glycosylated hemoglobin (Hb A1C)     Status: None   Collection Time: 01/21/15 11:32 AM  Result Value Ref Range   Hemoglobin A1C 5.8    Assessment and Plan :   1. Diabetes type 2,  controlled - Well controlled, will decrease glimepiride BID  dose to QD except on treatment days with oncology to avoid hypoglycemic episodes - Continue diabetic diet, blood sugar monitoring, follow up in 3 months  2. PVC (premature ventricular contraction) - stable, recent visit and work-up with Dr. Mare Ferrari reassuring, ECG today demonstrates PVCs, patient is asymptomatic, continue to monitor  3. Skin tag - stable, anticipatory guidance provided - follow up as needed   Jaynee Eagles, PA-C [facility] [phone] 01/21/2015 11:33 AM

## 2015-01-25 ENCOUNTER — Other Ambulatory Visit: Payer: Self-pay

## 2015-01-25 ENCOUNTER — Telehealth: Payer: Self-pay | Admitting: Emergency Medicine

## 2015-01-25 DIAGNOSIS — C9002 Multiple myeloma in relapse: Secondary | ICD-10-CM

## 2015-01-25 NOTE — Telephone Encounter (Signed)
Error

## 2015-01-26 ENCOUNTER — Telehealth: Payer: Self-pay | Admitting: *Deleted

## 2015-01-26 ENCOUNTER — Ambulatory Visit (HOSPITAL_BASED_OUTPATIENT_CLINIC_OR_DEPARTMENT_OTHER): Payer: Medicare Other | Admitting: Oncology

## 2015-01-26 ENCOUNTER — Ambulatory Visit (HOSPITAL_BASED_OUTPATIENT_CLINIC_OR_DEPARTMENT_OTHER): Payer: Medicare Other

## 2015-01-26 ENCOUNTER — Other Ambulatory Visit: Payer: Medicare Other

## 2015-01-26 ENCOUNTER — Other Ambulatory Visit (HOSPITAL_BASED_OUTPATIENT_CLINIC_OR_DEPARTMENT_OTHER): Payer: Medicare Other

## 2015-01-26 VITALS — BP 115/69 | HR 99 | Temp 98.4°F | Resp 18 | Ht 63.0 in | Wt 170.0 lb

## 2015-01-26 DIAGNOSIS — Z5111 Encounter for antineoplastic chemotherapy: Secondary | ICD-10-CM

## 2015-01-26 DIAGNOSIS — C9002 Multiple myeloma in relapse: Secondary | ICD-10-CM

## 2015-01-26 DIAGNOSIS — I2699 Other pulmonary embolism without acute cor pulmonale: Secondary | ICD-10-CM

## 2015-01-26 DIAGNOSIS — C9000 Multiple myeloma not having achieved remission: Secondary | ICD-10-CM

## 2015-01-26 DIAGNOSIS — Z5112 Encounter for antineoplastic immunotherapy: Secondary | ICD-10-CM

## 2015-01-26 DIAGNOSIS — E119 Type 2 diabetes mellitus without complications: Secondary | ICD-10-CM

## 2015-01-26 DIAGNOSIS — R197 Diarrhea, unspecified: Secondary | ICD-10-CM

## 2015-01-26 DIAGNOSIS — D62 Acute posthemorrhagic anemia: Secondary | ICD-10-CM

## 2015-01-26 LAB — CBC WITH DIFFERENTIAL/PLATELET
BASO%: 0.3 % (ref 0.0–2.0)
Basophils Absolute: 0 10*3/uL (ref 0.0–0.1)
EOS%: 2.1 % (ref 0.0–7.0)
Eosinophils Absolute: 0.2 10*3/uL (ref 0.0–0.5)
HCT: 30.6 % — ABNORMAL LOW (ref 34.8–46.6)
HGB: 9.4 g/dL — ABNORMAL LOW (ref 11.6–15.9)
LYMPH%: 7.9 % — ABNORMAL LOW (ref 14.0–49.7)
MCH: 25.7 pg (ref 25.1–34.0)
MCHC: 30.7 g/dL — ABNORMAL LOW (ref 31.5–36.0)
MCV: 83.6 fL (ref 79.5–101.0)
MONO#: 0.2 10*3/uL (ref 0.1–0.9)
MONO%: 3.2 % (ref 0.0–14.0)
NEUT%: 86.5 % — ABNORMAL HIGH (ref 38.4–76.8)
NEUTROS ABS: 6.2 10*3/uL (ref 1.5–6.5)
Platelets: 361 10*3/uL (ref 145–400)
RBC: 3.66 10*6/uL — AB (ref 3.70–5.45)
RDW: 18.3 % — AB (ref 11.2–14.5)
WBC: 7.2 10*3/uL (ref 3.9–10.3)
lymph#: 0.6 10*3/uL — ABNORMAL LOW (ref 0.9–3.3)

## 2015-01-26 LAB — COMPREHENSIVE METABOLIC PANEL (CC13)
ALBUMIN: 2.5 g/dL — AB (ref 3.5–5.0)
ALT: 11 U/L (ref 0–55)
AST: 25 U/L (ref 5–34)
Alkaline Phosphatase: 53 U/L (ref 40–150)
Anion Gap: 14 mEq/L — ABNORMAL HIGH (ref 3–11)
BUN: 16.4 mg/dL (ref 7.0–26.0)
CO2: 21 mEq/L — ABNORMAL LOW (ref 22–29)
Calcium: 9.1 mg/dL (ref 8.4–10.4)
Chloride: 102 mEq/L (ref 98–109)
Creatinine: 1.2 mg/dL — ABNORMAL HIGH (ref 0.6–1.1)
EGFR: 47 mL/min/{1.73_m2} — ABNORMAL LOW (ref >90)
Glucose: 181 mg/dl — ABNORMAL HIGH (ref 70–140)
POTASSIUM: 4.9 meq/L (ref 3.5–5.1)
SODIUM: 136 meq/L (ref 136–145)
TOTAL PROTEIN: 9.2 g/dL — AB (ref 6.4–8.3)
Total Bilirubin: 0.55 mg/dL (ref 0.20–1.20)

## 2015-01-26 MED ORDER — BORTEZOMIB CHEMO SQ INJECTION 3.5 MG (2.5MG/ML)
1.3000 mg/m2 | Freq: Once | INTRAMUSCULAR | Status: AC
  Administered 2015-01-26: 2.5 mg via SUBCUTANEOUS
  Filled 2015-01-26: qty 2.5

## 2015-01-26 MED ORDER — SODIUM CHLORIDE 0.9 % IV SOLN
500.0000 mg | Freq: Once | INTRAVENOUS | Status: AC
  Administered 2015-01-26: 500 mg via INTRAVENOUS
  Filled 2015-01-26: qty 25

## 2015-01-26 MED ORDER — ONDANSETRON 8 MG/NS 50 ML IVPB
INTRAVENOUS | Status: AC
  Filled 2015-01-26: qty 8

## 2015-01-26 MED ORDER — ONDANSETRON 8 MG/50ML IVPB (CHCC)
8.0000 mg | Freq: Once | INTRAVENOUS | Status: AC
  Administered 2015-01-26: 8 mg via INTRAVENOUS

## 2015-01-26 NOTE — Progress Notes (Signed)
[name]  [phone]) [phone] [phone]) [phone]     ID: [name] OB: [birth year]  MR#: [id number]  UXL#:[id number]  PCP: Sue Reichmann, MD GYN:   SU:  OTHER MD: Sue Marble, MD;  Sue Coco, MD  CHIEF COMPLAINT: Multiple myeloma, under active treatment CURRENT TREATMENT: dexamethasone, bortezomib, lenalidomide zometa  MYELOMA HISTORY: From the original consult note, dated 03/24/2014:  "The patient has a history of PE following arthroscopic surgery,as well as LLE DVT requiring Coumadin therapy since October of 2014. On 4/7 she presented to the ED with a 2 day history of hemoptysis and mucous material. INR was elevated at 6 requiring FFP and Vit K with improvement of coagulopathy (INR 1.4 this morning). Anticoagulation is on hold until hemoptysis resolves.CT angiogram on 4/7 was negative for pulmonary embolus. Bilateral pneumonia was suspected, requiring IV antibiotics.  Interestingly, diffusely heterogeneous appearance to the visualized bones were seen, more notable than on a prior CT in 2010. Bone scan on 4/8 showed old fractures in posterior 8-9 th ribs, and in a lumbar XR a new lumbar spinal fracture was noted, not picked up by the bone scan. Given the mottled appearance of the CT films,workup for multiple myeloma was initiated: She had anemia in the setting of chronic disease, iron deficiency and blood loss, and mild renal insufficiency was seen with a Cr 1.62. Sodium was normal. Her Calcium however was 12.3 on admission, today at greater than 15 receiving Calcitonin 100U nasal spray, Zometa 4 mg and hydration IV. SPEP / UPEP / IFE are pending. "  Her subsequent history is as detailed below.  INTERVAL HISTORY: [name] returns today for follow up of her multiple myeloma, accompanied by her husband [name]. Today is day 1, cycle 13 of subcutaneous bortezomib which she receives on days 1, 4, 8, and 11 of each 21 day cycle. It is also day 1 cycle 2 for her  cyclophosphamide. She also takes oral dexamethasone, 20 mg every Tuesday and Wednesday.  She continues on zoledronic acid every 12 weeks,, most recent dose 12/25/2014.   REVIEW OF SYSTEMS: [name] has been making the rounds of her physicians and has met with Dr. Mare Taylor, Dr. Everlene Taylor and Dr. Chase Taylor. She has been cleared to continue treatment. She does feel weak, and is very deconditioned. Her appetite is poor. Things just don't taste well. She bruises easily. She had a skin tag removed from her back without event. She tells me her sugars are very well controlled, though they do rise some when she takes the dexamethasone and she takes extra Amaryl on those days. She still gets diarrhea from the bortezomib, but she controls that well with Imodium. She has not required Questran so far. She has not lost her hair, and has had no significant nausea problems with the cyclophosphamide. A detailed review of systems today was otherwise noncontributory  PAST MEDICAL HISTORY: Past Medical History  Diagnosis Date  . Neurofibromatosis   . HTN (hypertension)   . Obesity   . Hyperlipidemia     statin intolerant. LDL is 83  . UTI (lower urinary tract infection) 05/29/12  . Pulmonary embolism 09/17/2013  . STEMI (ST elevation myocardial infarction) 05/29/2012    Inferolateral STEMI ; s/p DES to RCA, nl EF  . Multiple myeloma     Dr. Jana Hakim   . DVT (deep venous thrombosis) 08/2014    RLE  . Type II diabetes mellitus   . History of blood transfusion ?2015    "blood count dropped  real low"  . Osteoarthritis, knee   . Arthritis     "shoulders hurt" (09/30/2014)    PAST SURGICAL HISTORY: Past Surgical History  Procedure Laterality Date  . Clavicle surgery Left ~ 1960  . Ankle fracture surgery Right ?1990's  . Total knee arthroplasty Left 09/15/2013    Procedure: TOTAL KNEE ARTHROPLASTY;  Surgeon: Vickey Huger, MD;  Location: [facility];  Service: Orthopedics;  Laterality: Left;  . Hernia repair    . Coronary  angioplasty with stent placement  05/2012    "1"  . Tonsillectomy and adenoidectomy  1950's  . Fracture surgery    . Bone marrow biopsy  03/2014    Dr. Jana Hakim   . Vaginal hysterectomy  1990's  . Left heart catheterization with coronary angiogram N/A 05/29/2012    Procedure: LEFT HEART CATHETERIZATION WITH CORONARY ANGIOGRAM;  Surgeon: Jettie Booze, MD;  Location: [facility] CATH LAB;  Service: Cardiovascular;  Laterality: N/A;    FAMILY HISTORY Family History  Problem Relation Age of Onset  . Heart disease Mother   . Arthritis Mother   . Stroke Mother   . Heart disease Son     SOCIAL HISTORY:    (reviewed 05/19/2014)  Lives in [place] with husband of 15 years, [name]. 2 sons in good health.      ADVANCED DIRECTIVES:    HEALTH MAINTENANCE: (updated 05/19/2014)  History  Substance Use Topics  . Smoking status: Never Smoker   . Smokeless tobacco: Never Used  . Alcohol Use: No     Colonoscopy:  not on file   PAP: not on file   Bone density: not on file   Lipid panel: December 2014   Allergies  Allergen Reactions  . Codeine Nausea And Vomiting    Sees things  . Hydrocodone Nausea And Vomiting    Sees things  . Niaspan [Niacin Er] Nausea Only  . Robaxin [Methocarbamol] Other (See Comments)    Made patient feel funny  . Statins Nausea And Vomiting and Other (See Comments)    Myalgias/leg pain with atorvastatin, rosuvastatin, lovastatin, simvastatin  . Tetracycline Other (See Comments)    Pt doesn't remember reaction  . Zetia [Ezetimibe] Nausea And Vomiting  . Zithromax [Azithromycin] Nausea And Vomiting    Current Outpatient Prescriptions  Medication Sig Dispense Refill  . acetaminophen (TYLENOL) 325 MG tablet Take 1-2 tablets (325-650 mg total) by mouth every 4 (four) hours as needed for mild pain.    Marland Kitchen aspirin 81 MG tablet Take 81 mg by mouth daily.    . cetirizine (ZYRTEC) 10 MG tablet Take 10 mg by mouth daily as needed.     . cholestyramine (QUESTRAN) 4 G  packet Take 1 packet (4 g total) by mouth 2 (two) times daily. (Patient not taking: Reported on 01/21/2015) 60 each 12  . clindamycin (CLINDAGEL) 1 % gel Apply topically 2 (two) times daily. 30 g 0  . dexamethasone (DECADRON) 4 MG tablet TAKE 5 TABLETS BY MOUTH ON TUESDAY AND WEDNESDAY ONLY 45 tablet 0  . erythromycin ophthalmic ointment   2  . glimepiride (AMARYL) 2 MG tablet Take 2 tablets by mouth every morning with breakfast.  On the days that you take prednisone or have chemotherapy take another dose at the evening meal 90 tablet 1  . glucose blood (ONE TOUCH ULTRA TEST) test strip 1 each by Other route 3 (three) times daily. 100 each 12  . guaiFENesin (MUCINEX) 600 MG 12 hr tablet Take by mouth 2 (two) times  daily as needed.    . Lancets MISC Check glucose 3 times daily 100 each 6  . metoprolol tartrate (LOPRESSOR) 25 MG tablet Take 25 mg by mouth 2 (two) times daily.    . Multiple Vitamin (MULTIVITAMIN WITH MINERALS) TABS tablet Take 1 tablet by mouth at bedtime. Centrum Silver    . Multiple Vitamins-Minerals (OCUVITE PO) Take 1 tablet by mouth daily.     Marland Kitchen NITROSTAT 0.4 MG SL tablet DISSOLVE 1 TABLET UNDER THE TONGUE EVERY 5 MINUTES AS NEEDED FOR CHEST PAIN. UP TO 3 DOSES 25 tablet 0  . nystatin (MYCOSTATIN/NYSTOP) 100000 UNIT/GM POWD Apply 1 g topically 3 (three) times daily as needed. (Patient not taking: Reported on 01/21/2015) 30 g 1  . polyethylene glycol (MIRALAX / GLYCOLAX) packet Take 17 g by mouth daily.    . prochlorperazine (COMPAZINE) 10 MG tablet Take 1 tablet (10 mg total) by mouth every 6 (six) hours as needed (Nausea or vomiting). 90 tablet 4  . promethazine (PHENERGAN) 50 MG tablet   0  . traMADol (ULTRAM) 50 MG tablet Take 1 tablet (50 mg total) by mouth every 6 (six) hours as needed for moderate pain. 30 tablet 2   No current facility-administered medications for this visit.    OBJECTIVE: Middle-aged white woman who appears stated age 69 Vitals:   01/26/15 1249  BP:  115/69  Pulse: 99  Temp: 98.4 F (36.9 C)  Resp: 18     Body mass index is 30.12 kg/(m^2).    ECOG FS:1 - Symptomatic but completely ambulatory Filed Weights   01/26/15 1249  Weight: 170 lb (77.111 kg)   Sclerae unicteric, pupils round and equal Oropharynx shows minimal coating of the tongue, no obvious thrush No cervical or supraclavicular adenopathy Lungs no rales or rhonchi Heart regular rate and rhythm Abd soft, obese, nontender, positive bowel sounds MSK kyphosis and scoliosis but no focal spinal tenderness Neuro: nonfocal, well oriented, appropriate affect Breast exam: Deferred    LAB RESULTS:  Lab Results  Component Value Date   WBC 7.2 01/26/2015   NEUTROABS 6.2 01/26/2015   HGB 9.4* 01/26/2015   HCT 30.6* 01/26/2015   MCV 83.6 01/26/2015   PLT 361 01/26/2015      Chemistry      Component Value Date/Time   NA 136 01/26/2015 1159   NA 140 11/03/2014 1326   K 4.9 01/26/2015 1159   K 4.7 11/03/2014 1326   CL 100 11/03/2014 1326   CO2 21* 01/26/2015 1159   CO2 25 11/03/2014 1326   BUN 16.4 01/26/2015 1159   BUN 20 11/03/2014 1326   CREATININE 1.2* 01/26/2015 1159   CREATININE 0.89 11/03/2014 1326   CREATININE 0.81 12/09/2013 1000   GLU 173 11/08/2010      Component Value Date/Time   CALCIUM 9.1 01/26/2015 1159   CALCIUM 9.2 11/03/2014 1326   CALCIUM 11.1* 03/25/2014 0850   ALKPHOS 53 01/26/2015 1159   ALKPHOS 59 11/03/2014 1326   AST 25 01/26/2015 1159   AST 22 11/03/2014 1326   ALT 11 01/26/2015 1159   ALT 24 11/03/2014 1326   BILITOT 0.55 01/26/2015 1159   BILITOT 0.6 11/03/2014 1326      STUDIES: Dg Chest 2 View  12/31/2014   CLINICAL DATA:  Shortness of breath for 3 weeks, hemoptysis for 5 days, history hypertension, diabetes, pulmonary embolism, multiple myeloma  EXAM: CHEST  2 VIEW  COMPARISON:  10/07/2014  FINDINGS: Minimal enlargement of cardiac silhouette.  Mediastinal contours and pulmonary  vascularity normal.  Lungs clear.  No pleural  effusion or pneumothorax.  Bones demineralized.  Compression deformities of adjacent vertebrae at the thoracolumbar junction new at one level and progressive at adjacent level since bone survey 04/13/2014 but little changed since 10/07/2014.  IMPRESSION: Compression deformities of adjacent vertebrae at the thoracolumbar junction progressive since 04/13/2014 but little changed since 10/07/2014.   Electronically Signed   By: Lavonia Dana M.D.   On: 12/31/2014 20:00    ASSESSMENT: 69 [place] presenting with hypercalcemia, anemia and mottled bone lesions April 2015, M-spike 3.82 g, IFE showing IgA/kappa myeloma with bone marrow biopsy 03/30/2014 with 88% plasma cells, FISH  t(4,14), deletion 13 and deletion 11 (intermediate risk); initial beta-2 microglobulin 16.  (1) considered E1A11 study, but unable to tolerate anticoagulation  (2) associated issues:  (a) coagulopathy: history of post-op PE October 2014; history of bleeding with warfarin; epistaxis with  ASA 325 ms/ day; switched to 81 mg/ day with fair tolerance, then fully coumadinized until 01/05/2015  (c) hypercalcemia: started zolendronate 03/26/2014, to be repeated every 12 weeks  (d) pain: compression fracture L1, rib fractures: on tylenol and tramadol  (e) immunocompromise: on Septra prophylaxis, valacyclovir and acyclovir both stopped because of uncontrolled diarrhea  (3) started dexamethasone 40 mg/ week (20 mg po on Tues and Weds April 6283); complicated by hyperglycemia with history of diabetes, followed by Dr. Everlene Taylor  (4) started bortezomib SQ 04/28/2014, to be given days 1,4, 8 and 11 of ech 21 day cycle  (5) started lenalidomide first week in September 2015, held after 1 cycle with poor tolerance; resumed 09/30/2014 at 10 mg daily, days 1 through 21 of each 28 day cycle; stopped 01/02/2015, with little evidence of response  (6) cyclophosphamide started 01/05/2015 at fixed dose (approximately 300 mg/m IV), repeated  weekly  PLAN: [name] is tolerating the new treatments well. The question is are they working. We drew the appropriate lab work today and we will have those results by the time she comes a few days from now for the day for treatment. She does not have an appointment, but she will request to see me briefly so I can give her that report.  If we are making headway, then we will simply continue. Otherwise, if it looks like we are not getting the rib response we wish, I will probably continues her 1 more cycle and reassess just in case, but also refer her to [facility] for a second opinion regarding further management.  I reassured her that missing a heartbeat here and there is generally not something to be concerned about. She has had very favorable evaluations from cardiology and pulmonology.  Accordingly we are continuing with treatment today as planned. She knows to call for any problems that may develop before her next visit here.       Chauncey Cruel, MD   01/26/2015 1:29 PM

## 2015-01-26 NOTE — Telephone Encounter (Signed)
I have adjusted 3/1 appt 

## 2015-01-26 NOTE — Patient Instructions (Signed)
[facility] Discharge Instructions for Patients Receiving Chemotherapy  Today you received the following chemotherapy agents Cytoxan/Velcade.   To help prevent nausea and vomiting after your treatment, we encourage you to take your nausea medication as directed.    If you develop nausea and vomiting that is not controlled by your nausea medication, call the clinic.   BELOW ARE SYMPTOMS THAT SHOULD BE REPORTED IMMEDIATELY:  *FEVER GREATER THAN 100.5 F  *CHILLS WITH OR WITHOUT FEVER  NAUSEA AND VOMITING THAT IS NOT CONTROLLED WITH YOUR NAUSEA MEDICATION  *UNUSUAL SHORTNESS OF BREATH  *UNUSUAL BRUISING OR BLEEDING  TENDERNESS IN MOUTH AND THROAT WITH OR WITHOUT PRESENCE OF ULCERS  *URINARY PROBLEMS  *BOWEL PROBLEMS  UNUSUAL RASH Items with * indicate a potential emergency and should be followed up as soon as possible.  Feel free to call the clinic you have any questions or concerns. The clinic phone number is [phone].

## 2015-01-26 NOTE — Telephone Encounter (Signed)
Revlimid currently on hold per Dr. Jana Hakim. Prescription request faxed back to [facility] with this information.

## 2015-01-27 ENCOUNTER — Telehealth: Payer: Self-pay

## 2015-01-27 NOTE — Telephone Encounter (Signed)
Do you know about this note dr Everlene Farrier?

## 2015-01-27 NOTE — Telephone Encounter (Signed)
Pt states she talked with a "nurse" at [facility] 2 wks ago about a letter that Dr Everlene Farrier was to write on her behalf, indicating her husband is her caregiver. In checking with front office staff, there is nothing in the pick up drawer. Please call pt to advise the status of the letter

## 2015-01-27 NOTE — Telephone Encounter (Signed)
Please dictated a letter the patient needs help with her ADLs. She suffers from multiple myeloma, vertebral compression fractures, diabetes, and has had a recent DVT requiring Coumadin. Her husband has been providing 24 7 care for the last few months and any assistance that could be provided to help him would be greatly appreciated

## 2015-01-28 ENCOUNTER — Other Ambulatory Visit: Payer: Self-pay | Admitting: *Deleted

## 2015-01-28 DIAGNOSIS — C9002 Multiple myeloma in relapse: Secondary | ICD-10-CM

## 2015-01-28 LAB — PROTEIN ELECTROPHORESIS, SERUM
ALBUMIN ELP: 32.4 % — AB (ref 55.8–66.1)
Alpha-1-Globulin: 3.4 % (ref 2.9–4.9)
Alpha-2-Globulin: 7.1 % (ref 7.1–11.8)
Beta 2: 50.3 % — ABNORMAL HIGH (ref 3.2–6.5)
Beta Globulin: 4.9 % (ref 4.7–7.2)
Gamma Globulin: 1.9 % — ABNORMAL LOW (ref 11.1–18.8)
M-Spike, %: 0.45 g/dL
TOTAL PROTEIN, SERUM ELECTROPHOR: 8.9 g/dL — AB (ref 6.0–8.3)

## 2015-01-28 LAB — KAPPA/LAMBDA LIGHT CHAINS
Kappa free light chain: 124 mg/dL — ABNORMAL HIGH (ref 0.33–1.94)
Lambda Free Lght Chn: <0.03 mg/dL — ABNORMAL LOW (ref 0.57–2.63)

## 2015-01-28 NOTE — Addendum Note (Signed)
Addended by: [name] Boroughs D on: 01/28/2015 05:12 PM   Modules accepted: Orders

## 2015-01-28 NOTE — Telephone Encounter (Signed)
I have written/printed the letter and have put it in your box, Dr Everlene Farrier.

## 2015-01-29 ENCOUNTER — Other Ambulatory Visit (HOSPITAL_BASED_OUTPATIENT_CLINIC_OR_DEPARTMENT_OTHER): Payer: Medicare Other

## 2015-01-29 ENCOUNTER — Ambulatory Visit (HOSPITAL_BASED_OUTPATIENT_CLINIC_OR_DEPARTMENT_OTHER): Payer: Medicare Other

## 2015-01-29 DIAGNOSIS — C9002 Multiple myeloma in relapse: Secondary | ICD-10-CM

## 2015-01-29 DIAGNOSIS — Z5112 Encounter for antineoplastic immunotherapy: Secondary | ICD-10-CM

## 2015-01-29 DIAGNOSIS — C9000 Multiple myeloma not having achieved remission: Secondary | ICD-10-CM

## 2015-01-29 LAB — CBC WITH DIFFERENTIAL/PLATELET
BASO%: 0.4 % (ref 0.0–2.0)
Basophils Absolute: 0 10*3/uL (ref 0.0–0.1)
EOS%: 4.3 % (ref 0.0–7.0)
Eosinophils Absolute: 0.2 10*3/uL (ref 0.0–0.5)
HCT: 30.9 % — ABNORMAL LOW (ref 34.8–46.6)
HGB: 9.4 g/dL — ABNORMAL LOW (ref 11.6–15.9)
LYMPH#: 0.7 10*3/uL — AB (ref 0.9–3.3)
LYMPH%: 13.4 % — ABNORMAL LOW (ref 14.0–49.7)
MCH: 25.8 pg (ref 25.1–34.0)
MCHC: 30.4 g/dL — ABNORMAL LOW (ref 31.5–36.0)
MCV: 84.9 fL (ref 79.5–101.0)
MONO#: 1 10*3/uL — ABNORMAL HIGH (ref 0.1–0.9)
MONO%: 18.6 % — AB (ref 0.0–14.0)
NEUT%: 63.3 % (ref 38.4–76.8)
NEUTROS ABS: 3.5 10*3/uL (ref 1.5–6.5)
PLATELETS: 273 10*3/uL (ref 145–400)
RBC: 3.64 10*6/uL — ABNORMAL LOW (ref 3.70–5.45)
RDW: 18.9 % — AB (ref 11.2–14.5)
WBC: 5.5 10*3/uL (ref 3.9–10.3)
nRBC: 1 % — ABNORMAL HIGH (ref 0–0)

## 2015-01-29 LAB — MICROALBUMIN, URINE: Microalb, Ur: 3.4 mg/dL — ABNORMAL HIGH (ref <2.0)

## 2015-01-29 MED ORDER — BORTEZOMIB CHEMO SQ INJECTION 3.5 MG (2.5MG/ML)
1.3000 mg/m2 | Freq: Once | INTRAMUSCULAR | Status: AC
  Administered 2015-01-29: 2.5 mg via SUBCUTANEOUS
  Filled 2015-01-29: qty 2.5

## 2015-01-29 MED ORDER — ONDANSETRON HCL 8 MG PO TABS
ORAL_TABLET | ORAL | Status: AC
Start: 2015-01-29 — End: 2015-01-29
  Filled 2015-01-29: qty 1

## 2015-01-29 MED ORDER — ONDANSETRON HCL 8 MG PO TABS
8.0000 mg | ORAL_TABLET | Freq: Once | ORAL | Status: AC
Start: 2015-01-29 — End: 2015-01-29
  Administered 2015-01-29: 8 mg via ORAL

## 2015-01-29 NOTE — Patient Instructions (Signed)
[facility] Discharge Instructions for Patients Receiving Chemotherapy  Today you received the following chemotherapy agents: Velcade.  To help prevent nausea and vomiting after your treatment, we encourage you to take your nausea medication as prescribed.   If you develop nausea and vomiting that is not controlled by your nausea medication, call the clinic.   BELOW ARE SYMPTOMS THAT SHOULD BE REPORTED IMMEDIATELY:  *FEVER GREATER THAN 100.5 F  *CHILLS WITH OR WITHOUT FEVER  NAUSEA AND VOMITING THAT IS NOT CONTROLLED WITH YOUR NAUSEA MEDICATION  *UNUSUAL SHORTNESS OF BREATH  *UNUSUAL BRUISING OR BLEEDING  TENDERNESS IN MOUTH AND THROAT WITH OR WITHOUT PRESENCE OF ULCERS  *URINARY PROBLEMS  *BOWEL PROBLEMS  UNUSUAL RASH Items with * indicate a potential emergency and should be followed up as soon as possible.  Feel free to call the clinic you have any questions or concerns. The clinic phone number is [phone].    

## 2015-02-01 ENCOUNTER — Other Ambulatory Visit: Payer: Self-pay | Admitting: Emergency Medicine

## 2015-02-01 DIAGNOSIS — C9002 Multiple myeloma in relapse: Secondary | ICD-10-CM

## 2015-02-02 ENCOUNTER — Other Ambulatory Visit (HOSPITAL_COMMUNITY)
Admission: RE | Admit: 2015-02-02 | Discharge: 2015-02-02 | Disposition: A | Discharge status: Home / Self Care | Payer: Medicare Other | Source: Ambulatory Visit | Attending: Oncology | Admitting: Oncology

## 2015-02-02 ENCOUNTER — Other Ambulatory Visit (HOSPITAL_BASED_OUTPATIENT_CLINIC_OR_DEPARTMENT_OTHER): Payer: Medicare Other

## 2015-02-02 ENCOUNTER — Other Ambulatory Visit: Payer: Self-pay | Admitting: Oncology

## 2015-02-02 ENCOUNTER — Ambulatory Visit (HOSPITAL_BASED_OUTPATIENT_CLINIC_OR_DEPARTMENT_OTHER): Payer: Medicare Other

## 2015-02-02 ENCOUNTER — Other Ambulatory Visit: Payer: Self-pay | Admitting: *Deleted

## 2015-02-02 DIAGNOSIS — C9002 Multiple myeloma in relapse: Secondary | ICD-10-CM

## 2015-02-02 DIAGNOSIS — Z5111 Encounter for antineoplastic chemotherapy: Secondary | ICD-10-CM

## 2015-02-02 DIAGNOSIS — D62 Acute posthemorrhagic anemia: Secondary | ICD-10-CM

## 2015-02-02 DIAGNOSIS — C9000 Multiple myeloma not having achieved remission: Secondary | ICD-10-CM

## 2015-02-02 LAB — CBC WITH DIFFERENTIAL/PLATELET
BASO%: 0.2 % (ref 0.0–2.0)
BASOS ABS: 0 10*3/uL (ref 0.0–0.1)
EOS%: 2.4 % (ref 0.0–7.0)
Eosinophils Absolute: 0.1 10*3/uL (ref 0.0–0.5)
HCT: 30.5 % — ABNORMAL LOW (ref 34.8–46.6)
HGB: 9.3 g/dL — ABNORMAL LOW (ref 11.6–15.9)
LYMPH#: 0.5 10*3/uL — AB (ref 0.9–3.3)
LYMPH%: 12.8 % — ABNORMAL LOW (ref 14.0–49.7)
MCH: 25.5 pg (ref 25.1–34.0)
MCHC: 30.5 g/dL — ABNORMAL LOW (ref 31.5–36.0)
MCV: 83.6 fL (ref 79.5–101.0)
MONO#: 0.5 10*3/uL (ref 0.1–0.9)
MONO%: 12.6 % (ref 0.0–14.0)
NEUT#: 3 10*3/uL (ref 1.5–6.5)
NEUT%: 72 % (ref 38.4–76.8)
PLATELETS: 200 10*3/uL (ref 145–400)
RBC: 3.65 10*6/uL — ABNORMAL LOW (ref 3.70–5.45)
RDW: 19.2 % — AB (ref 11.2–14.5)
WBC: 4.2 10*3/uL (ref 3.9–10.3)
nRBC: 1 % — ABNORMAL HIGH (ref 0–0)

## 2015-02-02 LAB — COMPREHENSIVE METABOLIC PANEL (CC13)
ALT: 10 U/L (ref 0–55)
AST: 21 U/L (ref 5–34)
Albumin: 2.1 g/dL — ABNORMAL LOW (ref 3.5–5.0)
Alkaline Phosphatase: 48 U/L (ref 40–150)
Anion Gap: 14 mEq/L — ABNORMAL HIGH (ref 3–11)
BUN: 18.1 mg/dL (ref 7.0–26.0)
CO2: 21 mEq/L — ABNORMAL LOW (ref 22–29)
Calcium: 8.6 mg/dL (ref 8.4–10.4)
Chloride: 104 mEq/L (ref 98–109)
Creatinine: 1.1 mg/dL (ref 0.6–1.1)
EGFR: 50 mL/min/{1.73_m2} — ABNORMAL LOW (ref >90)
Glucose: 181 mg/dl — ABNORMAL HIGH (ref 70–140)
Potassium: 4.6 mEq/L (ref 3.5–5.1)
Sodium: 139 mEq/L (ref 136–145)
Total Bilirubin: 0.38 mg/dL (ref 0.20–1.20)
Total Protein: 9.1 g/dL — ABNORMAL HIGH (ref 6.4–8.3)

## 2015-02-02 LAB — CLOSTRIDIUM DIFFICILE BY PCR: CDIFFPCR: NEGATIVE

## 2015-02-02 MED ORDER — ONDANSETRON 8 MG/50ML IVPB (CHCC)
8.0000 mg | Freq: Once | INTRAVENOUS | Status: AC
  Administered 2015-02-02: 8 mg via INTRAVENOUS

## 2015-02-02 MED ORDER — SODIUM CHLORIDE 0.9 % IV SOLN
500.0000 mg | Freq: Once | INTRAVENOUS | Status: AC
  Administered 2015-02-02: 500 mg via INTRAVENOUS
  Filled 2015-02-02: qty 25

## 2015-02-02 MED ORDER — ONDANSETRON 8 MG/NS 50 ML IVPB
INTRAVENOUS | Status: AC
Start: 2015-02-02 — End: 2015-02-02
  Filled 2015-02-02: qty 8

## 2015-02-02 NOTE — Patient Instructions (Signed)
[facility] Discharge Instructions for Patients Receiving Chemotherapy  Today you received the following chemotherapy agents cytoxan  To help prevent nausea and vomiting after your treatment, we encourage you to take your nausea medication as needed   If you develop nausea and vomiting that is not controlled by your nausea medication, call the clinic.   BELOW ARE SYMPTOMS THAT SHOULD BE REPORTED IMMEDIATELY:  *FEVER GREATER THAN 100.5 F  *CHILLS WITH OR WITHOUT FEVER  NAUSEA AND VOMITING THAT IS NOT CONTROLLED WITH YOUR NAUSEA MEDICATION  *UNUSUAL SHORTNESS OF BREATH  *UNUSUAL BRUISING OR BLEEDING  TENDERNESS IN MOUTH AND THROAT WITH OR WITHOUT PRESENCE OF ULCERS  *URINARY PROBLEMS  *BOWEL PROBLEMS  UNUSUAL RASH Items with * indicate a potential emergency and should be followed up as soon as possible.  Feel free to call the clinic you have any questions or concerns. The clinic phone number is [phone].

## 2015-02-02 NOTE — Progress Notes (Signed)
Addendum: I reviewed the most recent SPEP results with [name]. There has been a further increase in the Light chains. However we have only just started this Cytoxan less than a month ago. I think he would be worth continuing that for the time being before making a more drastic change.  She tells me she gets diarrhea from the bortezomib and she is having diarrhea right now. I'm holding the bortezomib today. I am also setting her up for C. difficile study.

## 2015-02-02 NOTE — Progress Notes (Signed)
1035: Dr. Jana Hakim notified that pt requesting lab results from last week. Also notified that patient reports over 12 episodes of diarrhea on Saturday 2/13, states she took imodium which helped. States last episode of diarrhea was Sunday 2/14.  Reports she has had a lot of nausea and vomited once yesterday. States she took her nausea medication and it helped provide relief. Pt also reports bruise to right ankle, states she thinks she hit it with her walker. Area is red and swollen, pt denies pain and states she is able to walk normally.  Dr. Jana Hakim states he will come see patient before starting treatment.

## 2015-02-02 NOTE — Addendum Note (Signed)
Addended by: Chauncey Cruel on: 02/02/2015 11:22 AM   Modules accepted: Orders

## 2015-02-03 ENCOUNTER — Other Ambulatory Visit: Payer: Self-pay | Admitting: Oncology

## 2015-02-04 ENCOUNTER — Other Ambulatory Visit: Payer: Self-pay | Admitting: *Deleted

## 2015-02-04 DIAGNOSIS — C9000 Multiple myeloma not having achieved remission: Secondary | ICD-10-CM

## 2015-02-05 ENCOUNTER — Other Ambulatory Visit (HOSPITAL_BASED_OUTPATIENT_CLINIC_OR_DEPARTMENT_OTHER): Payer: Medicare Other

## 2015-02-05 ENCOUNTER — Ambulatory Visit (HOSPITAL_BASED_OUTPATIENT_CLINIC_OR_DEPARTMENT_OTHER): Payer: Medicare Other

## 2015-02-05 DIAGNOSIS — C9002 Multiple myeloma in relapse: Secondary | ICD-10-CM

## 2015-02-05 DIAGNOSIS — C9000 Multiple myeloma not having achieved remission: Secondary | ICD-10-CM

## 2015-02-05 DIAGNOSIS — Z5112 Encounter for antineoplastic immunotherapy: Secondary | ICD-10-CM

## 2015-02-05 LAB — CBC WITH DIFFERENTIAL/PLATELET
BASO%: 0.2 % (ref 0.0–2.0)
Basophils Absolute: 0 10*3/uL (ref 0.0–0.1)
EOS ABS: 0.2 10*3/uL (ref 0.0–0.5)
EOS%: 4.5 % (ref 0.0–7.0)
HEMATOCRIT: 28.5 % — AB (ref 34.8–46.6)
HEMOGLOBIN: 8.7 g/dL — AB (ref 11.6–15.9)
LYMPH%: 10 % — AB (ref 14.0–49.7)
MCH: 25.9 pg (ref 25.1–34.0)
MCHC: 30.5 g/dL — ABNORMAL LOW (ref 31.5–36.0)
MCV: 84.8 fL (ref 79.5–101.0)
MONO#: 0.9 10*3/uL (ref 0.1–0.9)
MONO%: 20.5 % — AB (ref 0.0–14.0)
NEUT#: 2.7 10*3/uL (ref 1.5–6.5)
NEUT%: 64.8 % (ref 38.4–76.8)
PLATELETS: 227 10*3/uL (ref 145–400)
RBC: 3.36 10*6/uL — AB (ref 3.70–5.45)
RDW: 20.2 % — ABNORMAL HIGH (ref 11.2–14.5)
WBC: 4.2 10*3/uL (ref 3.9–10.3)
lymph#: 0.4 10*3/uL — ABNORMAL LOW (ref 0.9–3.3)

## 2015-02-05 LAB — COMPREHENSIVE METABOLIC PANEL (CC13)
ALT: 10 U/L (ref 0–55)
AST: 21 U/L (ref 5–34)
Albumin: 2.1 g/dL — ABNORMAL LOW (ref 3.5–5.0)
Alkaline Phosphatase: 45 U/L (ref 40–150)
Anion Gap: 12 mEq/L — ABNORMAL HIGH (ref 3–11)
BILIRUBIN TOTAL: 0.3 mg/dL (ref 0.20–1.20)
BUN: 18.8 mg/dL (ref 7.0–26.0)
CO2: 23 meq/L (ref 22–29)
CREATININE: 1.3 mg/dL — AB (ref 0.6–1.1)
Calcium: 8.4 mg/dL (ref 8.4–10.4)
Chloride: 104 mEq/L (ref 98–109)
EGFR: 44 mL/min/{1.73_m2} — AB (ref >90)
Glucose: 197 mg/dl — ABNORMAL HIGH (ref 70–140)
Potassium: 4.6 mEq/L (ref 3.5–5.1)
Sodium: 139 mEq/L (ref 136–145)
Total Protein: 9 g/dL — ABNORMAL HIGH (ref 6.4–8.3)

## 2015-02-05 MED ORDER — ONDANSETRON HCL 8 MG PO TABS
8.0000 mg | ORAL_TABLET | Freq: Once | ORAL | Status: AC
  Administered 2015-02-05: 8 mg via ORAL

## 2015-02-05 MED ORDER — BORTEZOMIB CHEMO SQ INJECTION 3.5 MG (2.5MG/ML)
1.3000 mg/m2 | Freq: Once | INTRAMUSCULAR | Status: AC
  Administered 2015-02-05: 2.5 mg via SUBCUTANEOUS
  Filled 2015-02-05: qty 2.5

## 2015-02-05 MED ORDER — SODIUM CHLORIDE 0.9 % IV SOLN
3.5000 mg | Freq: Once | INTRAVENOUS | Status: AC
  Administered 2015-02-05: 3.5 mg via INTRAVENOUS
  Filled 2015-02-05: qty 4.38

## 2015-02-05 MED ORDER — ONDANSETRON HCL 8 MG PO TABS
ORAL_TABLET | ORAL | Status: AC
  Filled 2015-02-05: qty 1

## 2015-02-05 NOTE — Patient Instructions (Signed)
[facility] Discharge Instructions for Patients Receiving Chemotherapy  Today you received the following chemotherapy agents Velcade To help prevent nausea and vomiting after your treatment, we encourage you to take your nausea medication as prescribed.   If you develop nausea and vomiting that is not controlled by your nausea medication, call the clinic.   BELOW ARE SYMPTOMS THAT SHOULD BE REPORTED IMMEDIATELY:  *FEVER GREATER THAN 100.5 F  *CHILLS WITH OR WITHOUT FEVER  NAUSEA AND VOMITING THAT IS NOT CONTROLLED WITH YOUR NAUSEA MEDICATION  *UNUSUAL SHORTNESS OF BREATH  *UNUSUAL BRUISING OR BLEEDING  TENDERNESS IN MOUTH AND THROAT WITH OR WITHOUT PRESENCE OF ULCERS  *URINARY PROBLEMS  *BOWEL PROBLEMS  UNUSUAL RASH Items with * indicate a potential emergency and should be followed up as soon as possible.  Feel free to call the clinic you have any questions or concerns. The clinic phone number is [phone].   Zoledronic Acid injection (Hypercalcemia, Oncology) What is this medicine? ZOLEDRONIC ACID (ZOE le dron ik AS id) lowers the amount of calcium loss from bone. It is used to treat too much calcium in your blood from cancer. It is also used to prevent complications of cancer that has spread to the bone. This medicine may be used for other purposes; ask your health care provider or pharmacist if you have questions. COMMON BRAND NAME(S): Zometa What should I tell my health care provider before I take this medicine? They need to know if you have any of these conditions: -aspirin-sensitive asthma -cancer, especially if you are receiving medicines used to treat cancer -dental disease or wear dentures -infection -kidney disease -receiving corticosteroids like dexamethasone or prednisone -an unusual or allergic reaction to zoledronic acid, other medicines, foods, dyes, or preservatives -pregnant or trying to get pregnant -breast-feeding How should I use  this medicine? This medicine is for infusion into a vein. It is given by a health care professional in a hospital or clinic setting. Talk to your pediatrician regarding the use of this medicine in children. Special care may be needed. Overdosage: If you think you have taken too much of this medicine contact a poison control center or emergency room at once. NOTE: This medicine is only for you. Do not share this medicine with others. What if I miss a dose? It is important not to miss your dose. Call your doctor or health care professional if you are unable to keep an appointment. What may interact with this medicine? -certain antibiotics given by injection -NSAIDs, medicines for pain and inflammation, like ibuprofen or naproxen -some diuretics like bumetanide, furosemide -teriparatide -thalidomide This list may not describe all possible interactions. Give your health care provider a list of all the medicines, herbs, non-prescription drugs, or dietary supplements you use. Also tell them if you smoke, drink alcohol, or use illegal drugs. Some items may interact with your medicine. What should I watch for while using this medicine? Visit your doctor or health care professional for regular checkups. It may be some time before you see the benefit from this medicine. Do not stop taking your medicine unless your doctor tells you to. Your doctor may order blood tests or other tests to see how you are doing. Women should inform their doctor if they wish to become pregnant or think they might be pregnant. There is a potential for serious side effects to an unborn child. Talk to your health care professional or pharmacist for more information. You should make sure that you get enough   calcium and vitamin D while you are taking this medicine. Discuss the foods you eat and the vitamins you take with your health care professional. Some people who take this medicine have severe bone, joint, and/or muscle pain. This  medicine may also increase your risk for jaw problems or a broken thigh bone. Tell your doctor right away if you have severe pain in your jaw, bones, joints, or muscles. Tell your doctor if you have any pain that does not go away or that gets worse. Tell your dentist and dental surgeon that you are taking this medicine. You should not have major dental surgery while on this medicine. See your dentist to have a dental exam and fix any dental problems before starting this medicine. Take good care of your teeth while on this medicine. Make sure you see your dentist for regular follow-up appointments. What side effects may I notice from receiving this medicine? Side effects that you should report to your doctor or health care professional as soon as possible: -allergic reactions like skin rash, itching or hives, swelling of the face, lips, or tongue -anxiety, confusion, or depression -breathing problems -changes in vision -eye pain -feeling faint or lightheaded, falls -jaw pain, especially after dental work -mouth sores -muscle cramps, stiffness, or weakness -trouble passing urine or change in the amount of urine Side effects that usually do not require medical attention (report to your doctor or health care professional if they continue or are bothersome): -bone, joint, or muscle pain -constipation -diarrhea -fever -hair loss -irritation at site where injected -loss of appetite -nausea, vomiting -stomach upset -trouble sleeping -trouble swallowing -weak or tired This list may not describe all possible side effects. Call your doctor for medical advice about side effects. You may report side effects to FDA at 1-800-FDA-1088. Where should I keep my medicine? This drug is given in a hospital or clinic and will not be stored at home. NOTE: This sheet is a summary. It may not cover all possible information. If you have questions about this medicine, talk to your doctor, pharmacist, or health  care provider.  2015, [employer]. (2013-05-15 13:03:13)    

## 2015-02-08 ENCOUNTER — Other Ambulatory Visit: Payer: Self-pay | Admitting: *Deleted

## 2015-02-08 DIAGNOSIS — C9002 Multiple myeloma in relapse: Secondary | ICD-10-CM

## 2015-02-09 ENCOUNTER — Ambulatory Visit (HOSPITAL_BASED_OUTPATIENT_CLINIC_OR_DEPARTMENT_OTHER): Payer: Medicare Other

## 2015-02-09 ENCOUNTER — Other Ambulatory Visit: Payer: Self-pay | Admitting: *Deleted

## 2015-02-09 ENCOUNTER — Other Ambulatory Visit (HOSPITAL_BASED_OUTPATIENT_CLINIC_OR_DEPARTMENT_OTHER): Payer: Medicare Other

## 2015-02-09 DIAGNOSIS — C9000 Multiple myeloma not having achieved remission: Secondary | ICD-10-CM

## 2015-02-09 DIAGNOSIS — C9002 Multiple myeloma in relapse: Secondary | ICD-10-CM

## 2015-02-09 DIAGNOSIS — Z5112 Encounter for antineoplastic immunotherapy: Secondary | ICD-10-CM

## 2015-02-09 LAB — CBC WITH DIFFERENTIAL/PLATELET
BASO%: 0.5 % (ref 0.0–2.0)
Basophils Absolute: 0 10*3/uL (ref 0.0–0.1)
EOS%: 1.8 % (ref 0.0–7.0)
Eosinophils Absolute: 0.1 10*3/uL (ref 0.0–0.5)
HCT: 29.7 % — ABNORMAL LOW (ref 34.8–46.6)
HGB: 9.1 g/dL — ABNORMAL LOW (ref 11.6–15.9)
LYMPH#: 0.4 10*3/uL — AB (ref 0.9–3.3)
LYMPH%: 10.3 % — ABNORMAL LOW (ref 14.0–49.7)
MCH: 25.7 pg (ref 25.1–34.0)
MCHC: 30.6 g/dL — AB (ref 31.5–36.0)
MCV: 83.9 fL (ref 79.5–101.0)
MONO#: 0.3 10*3/uL (ref 0.1–0.9)
MONO%: 7.8 % (ref 0.0–14.0)
NEUT#: 3.2 10*3/uL (ref 1.5–6.5)
NEUT%: 79.6 % — ABNORMAL HIGH (ref 38.4–76.8)
Platelets: 217 10*3/uL (ref 145–400)
RBC: 3.54 10*6/uL — AB (ref 3.70–5.45)
RDW: 20.9 % — AB (ref 11.2–14.5)
WBC: 4 10*3/uL (ref 3.9–10.3)
nRBC: 1 % — ABNORMAL HIGH (ref 0–0)

## 2015-02-09 MED ORDER — SODIUM CHLORIDE 0.9 % IJ SOLN
10.0000 mL | Freq: Once | INTRAMUSCULAR | Status: DC
  Filled 2015-02-09: qty 10

## 2015-02-09 MED ORDER — ONDANSETRON 8 MG/50ML IVPB (CHCC)
8.0000 mg | Freq: Once | INTRAVENOUS | Status: AC
  Administered 2015-02-09: 8 mg via INTRAVENOUS

## 2015-02-09 MED ORDER — HEPARIN SOD (PORK) LOCK FLUSH 100 UNIT/ML IV SOLN
500.0000 [IU] | Freq: Once | INTRAVENOUS | Status: DC
  Filled 2015-02-09: qty 5

## 2015-02-09 MED ORDER — SODIUM CHLORIDE 0.9 % IV SOLN
Freq: Once | INTRAVENOUS | Status: AC
  Administered 2015-02-09: 12:00:00 via INTRAVENOUS

## 2015-02-09 MED ORDER — ONDANSETRON 8 MG/NS 50 ML IVPB
INTRAVENOUS | Status: AC
  Filled 2015-02-09: qty 8

## 2015-02-09 MED ORDER — SODIUM CHLORIDE 0.9 % IV SOLN
500.0000 mg | Freq: Once | INTRAVENOUS | Status: AC
  Administered 2015-02-09: 500 mg via INTRAVENOUS
  Filled 2015-02-09: qty 25

## 2015-02-09 NOTE — Patient Instructions (Signed)
[facility] Discharge Instructions for Patients Receiving Chemotherapy  Today you received the following chemotherapy agent: Cytoxan   To help prevent nausea and vomiting after your treatment, we encourage you to take your nausea medication as prescribed.    If you develop nausea and vomiting that is not controlled by your nausea medication, call the clinic.   BELOW ARE SYMPTOMS THAT SHOULD BE REPORTED IMMEDIATELY:  *FEVER GREATER THAN 100.5 F  *CHILLS WITH OR WITHOUT FEVER  NAUSEA AND VOMITING THAT IS NOT CONTROLLED WITH YOUR NAUSEA MEDICATION  *UNUSUAL SHORTNESS OF BREATH  *UNUSUAL BRUISING OR BLEEDING  TENDERNESS IN MOUTH AND THROAT WITH OR WITHOUT PRESENCE OF ULCERS  *URINARY PROBLEMS  *BOWEL PROBLEMS  UNUSUAL RASH Items with * indicate a potential emergency and should be followed up as soon as possible.  Feel free to call the clinic you have any questions or concerns. The clinic phone number is [phone].

## 2015-02-10 ENCOUNTER — Other Ambulatory Visit: Payer: Self-pay | Admitting: *Deleted

## 2015-02-12 ENCOUNTER — Telehealth: Payer: Self-pay

## 2015-02-12 NOTE — Telephone Encounter (Signed)
Fax from [facility] requesting why we stopped revlimid. Checked change in therapy to cyclophosphamide and faxed back.

## 2015-02-16 ENCOUNTER — Other Ambulatory Visit (HOSPITAL_BASED_OUTPATIENT_CLINIC_OR_DEPARTMENT_OTHER): Payer: Medicare Other

## 2015-02-16 ENCOUNTER — Other Ambulatory Visit: Payer: Medicare Other

## 2015-02-16 ENCOUNTER — Ambulatory Visit (HOSPITAL_BASED_OUTPATIENT_CLINIC_OR_DEPARTMENT_OTHER): Payer: Medicare Other | Admitting: Oncology

## 2015-02-16 ENCOUNTER — Ambulatory Visit (HOSPITAL_BASED_OUTPATIENT_CLINIC_OR_DEPARTMENT_OTHER): Payer: Medicare Other

## 2015-02-16 VITALS — BP 127/62 | HR 105 | Temp 97.9°F | Resp 18 | Ht 63.0 in | Wt 168.7 lb

## 2015-02-16 DIAGNOSIS — C9000 Multiple myeloma not having achieved remission: Secondary | ICD-10-CM

## 2015-02-16 DIAGNOSIS — I2699 Other pulmonary embolism without acute cor pulmonale: Secondary | ICD-10-CM

## 2015-02-16 DIAGNOSIS — N189 Chronic kidney disease, unspecified: Secondary | ICD-10-CM

## 2015-02-16 DIAGNOSIS — E1122 Type 2 diabetes mellitus with diabetic chronic kidney disease: Secondary | ICD-10-CM

## 2015-02-16 DIAGNOSIS — C9002 Multiple myeloma in relapse: Secondary | ICD-10-CM

## 2015-02-16 DIAGNOSIS — I82402 Acute embolism and thrombosis of unspecified deep veins of left lower extremity: Secondary | ICD-10-CM

## 2015-02-16 DIAGNOSIS — R197 Diarrhea, unspecified: Secondary | ICD-10-CM

## 2015-02-16 DIAGNOSIS — D62 Acute posthemorrhagic anemia: Secondary | ICD-10-CM

## 2015-02-16 DIAGNOSIS — Z5111 Encounter for antineoplastic chemotherapy: Secondary | ICD-10-CM

## 2015-02-16 DIAGNOSIS — N183 Chronic kidney disease, stage 3 (moderate): Secondary | ICD-10-CM

## 2015-02-16 DIAGNOSIS — D6481 Anemia due to antineoplastic chemotherapy: Secondary | ICD-10-CM

## 2015-02-16 LAB — COMPREHENSIVE METABOLIC PANEL (CC13)
ALT: 8 U/L (ref 0–55)
ANION GAP: 16 meq/L — AB (ref 3–11)
AST: 37 U/L — ABNORMAL HIGH (ref 5–34)
Albumin: 2.1 g/dL — ABNORMAL LOW (ref 3.5–5.0)
Alkaline Phosphatase: 52 U/L (ref 40–150)
BUN: 18.2 mg/dL (ref 7.0–26.0)
CALCIUM: 8.8 mg/dL (ref 8.4–10.4)
CHLORIDE: 103 meq/L (ref 98–109)
CO2: 20 meq/L — AB (ref 22–29)
Creatinine: 1.3 mg/dL — ABNORMAL HIGH (ref 0.6–1.1)
EGFR: 41 mL/min/{1.73_m2} — AB (ref >90)
Glucose: 209 mg/dl — ABNORMAL HIGH (ref 70–140)
POTASSIUM: 5.6 meq/L — AB (ref 3.5–5.1)
SODIUM: 139 meq/L (ref 136–145)
TOTAL PROTEIN: 10.4 g/dL — AB (ref 6.4–8.3)
Total Bilirubin: 0.53 mg/dL (ref 0.20–1.20)

## 2015-02-16 LAB — CBC WITH DIFFERENTIAL/PLATELET
BASO%: 0.6 % (ref 0.0–2.0)
Basophils Absolute: 0 10*3/uL (ref 0.0–0.1)
EOS%: 1 % (ref 0.0–7.0)
Eosinophils Absolute: 0 10*3/uL (ref 0.0–0.5)
HCT: 27.7 % — ABNORMAL LOW (ref 34.8–46.6)
HGB: 8.8 g/dL — ABNORMAL LOW (ref 11.6–15.9)
LYMPH#: 0.4 10*3/uL — AB (ref 0.9–3.3)
LYMPH%: 8.9 % — ABNORMAL LOW (ref 14.0–49.7)
MCH: 26.4 pg (ref 25.1–34.0)
MCHC: 31.7 g/dL (ref 31.5–36.0)
MCV: 83.3 fL (ref 79.5–101.0)
MONO#: 0.2 10*3/uL (ref 0.1–0.9)
MONO%: 5 % (ref 0.0–14.0)
NEUT%: 84.5 % — AB (ref 38.4–76.8)
NEUTROS ABS: 4 10*3/uL (ref 1.5–6.5)
PLATELETS: 284 10*3/uL (ref 145–400)
RBC: 3.32 10*6/uL — AB (ref 3.70–5.45)
RDW: 23.2 % — ABNORMAL HIGH (ref 11.2–14.5)
WBC: 4.7 10*3/uL (ref 3.9–10.3)

## 2015-02-16 LAB — TECHNOLOGIST REVIEW

## 2015-02-16 MED ORDER — ONDANSETRON 8 MG/NS 50 ML IVPB
INTRAVENOUS | Status: AC
  Filled 2015-02-16: qty 8

## 2015-02-16 MED ORDER — DARBEPOETIN ALFA 200 MCG/0.4ML IJ SOSY
200.0000 ug | PREFILLED_SYRINGE | Freq: Once | INTRAMUSCULAR | Status: DC
  Filled 2015-02-16: qty 0.4

## 2015-02-16 MED ORDER — CYCLOPHOSPHAMIDE CHEMO INJECTION 1 GM
500.0000 mg | Freq: Once | INTRAMUSCULAR | Status: AC
  Administered 2015-02-16: 500 mg via INTRAVENOUS
  Filled 2015-02-16: qty 25

## 2015-02-16 MED ORDER — CLINDAMYCIN PHOSPHATE 1 % EX GEL: Freq: Two times a day (BID) | CUTANEOUS | Status: AC

## 2015-02-16 MED ORDER — ONDANSETRON 8 MG/50ML IVPB (CHCC)
8.0000 mg | Freq: Once | INTRAVENOUS | Status: AC
  Administered 2015-02-16: 8 mg via INTRAVENOUS

## 2015-02-16 NOTE — Patient Instructions (Addendum)
[facility] Discharge Instructions for Patients Receiving Chemotherapy  Today you received the following chemotherapy agents: Cytoxan.  To help prevent nausea and vomiting after your treatment, we encourage you to take your nausea medication: Compazine (Prochloperazine.) Take one every six hours as needed.    If you develop nausea and vomiting that is not controlled by your nausea medication, call the clinic.   BELOW ARE SYMPTOMS THAT SHOULD BE REPORTED IMMEDIATELY:  *FEVER GREATER THAN 100.5 F  *CHILLS WITH OR WITHOUT FEVER  NAUSEA AND VOMITING THAT IS NOT CONTROLLED WITH YOUR NAUSEA MEDICATION  *UNUSUAL SHORTNESS OF BREATH  *UNUSUAL BRUISING OR BLEEDING  TENDERNESS IN MOUTH AND THROAT WITH OR WITHOUT PRESENCE OF ULCERS  *URINARY PROBLEMS  *BOWEL PROBLEMS  UNUSUAL RASH Items with * indicate a potential emergency and should be followed up as soon as possible.  Feel free to call the clinic should you have any questions or concerns. The clinic phone number is [phone].   Darbepoetin Alfa injection What is this medicine? DARBEPOETIN ALFA (dar be POE e tin AL fa) helps your body make more red blood cells. It is used to treat anemia caused by chronic kidney failure and chemotherapy. This medicine may be used for other purposes; ask your health care provider or pharmacist if you have questions. COMMON BRAND NAME(S): Aranesp What should I tell my health care provider before I take this medicine? They need to know if you have any of these conditions: -blood clotting disorders or history of blood clots -cancer patient not on chemotherapy -cystic fibrosis -heart disease, such as angina, heart failure, or a history of a heart attack -hemoglobin level of 12 g/dL or greater -high blood pressure -low levels of folate, iron, or vitamin B12 -seizures -an unusual or allergic reaction to darbepoetin, erythropoietin, albumin, hamster proteins, latex, other medicines,  foods, dyes, or preservatives -pregnant or trying to get pregnant -breast-feeding How should I use this medicine? This medicine is for injection into a vein or under the skin. It is usually given by a health care professional in a hospital or clinic setting. If you get this medicine at home, you will be taught how to prepare and give this medicine. Do not shake the solution before you withdraw a dose. Use exactly as directed. Take your medicine at regular intervals. Do not take your medicine more often than directed. It is important that you put your used needles and syringes in a special sharps container. Do not put them in a trash can. If you do not have a sharps container, call your pharmacist or healthcare provider to get one. Talk to your pediatrician regarding the use of this medicine in children. While this medicine may be used in children as young as 1 year for selected conditions, precautions do apply. Overdosage: If you think you have taken too much of this medicine contact a poison control center or emergency room at once. NOTE: This medicine is only for you. Do not share this medicine with others. What if I miss a dose? If you miss a dose, take it as soon as you can. If it is almost time for your next dose, take only that dose. Do not take double or extra doses. What may interact with this medicine? Do not take this medicine with any of the following medications: -epoetin alfa This list may not describe all possible interactions. Give your health care provider a list of all the medicines, herbs, non-prescription drugs, or dietary supplements you use.  Also tell them if you smoke, drink alcohol, or use illegal drugs. Some items may interact with your medicine. What should I watch for while using this medicine? Visit your prescriber or health care professional for regular checks on your progress and for the needed blood tests and blood pressure measurements. It is especially important for  the doctor to make sure your hemoglobin level is in the desired range, to limit the risk of potential side effects and to give you the best benefit. Keep all appointments for any recommended tests. Check your blood pressure as directed. Ask your doctor what your blood pressure should be and when you should contact him or her. As your body makes more red blood cells, you may need to take iron, folic acid, or vitamin B supplements. Ask your doctor or health care provider which products are right for you. If you have kidney disease continue dietary restrictions, even though this medication can make you feel better. Talk with your doctor or health care professional about the foods you eat and the vitamins that you take. What side effects may I notice from receiving this medicine? Side effects that you should report to your doctor or health care professional as soon as possible: -allergic reactions like skin rash, itching or hives, swelling of the face, lips, or tongue -breathing problems -changes in vision -chest pain -confusion, trouble speaking or understanding -feeling faint or lightheaded, falls -high blood pressure -muscle aches or pains -pain, swelling, warmth in the leg -rapid weight gain -severe headaches -sudden numbness or weakness of the face, arm or leg -trouble walking, dizziness, loss of balance or coordination -seizures (convulsions) -swelling of the ankles, feet, hands -unusually weak or tired Side effects that usually do not require medical attention (report to your doctor or health care professional if they continue or are bothersome): -diarrhea -fever, chills (flu-like symptoms) -headaches -nausea, vomiting -redness, stinging, or swelling at site where injected This list may not describe all possible side effects. Call your doctor for medical advice about side effects. You may report side effects to FDA at 1-800-FDA-1088. Where should I keep my medicine? Keep out of the  reach of children. Store in a refrigerator between 2 and 8 degrees C (36 and 46 degrees F). Do not freeze. Do not shake. Throw away any unused portion if using a single-dose vial. Throw away any unused medicine after the expiration date. NOTE: This sheet is a summary. It may not cover all possible information. If you have questions about this medicine, talk to your doctor, pharmacist, or health care provider.  2015, [employer]. (2008-11-17 10:23:57)

## 2015-02-16 NOTE — Progress Notes (Signed)
Patient declined Aranesp today. She'd like to discuss it further with Dr. Jana Hakim. Reviewed with MD, will hold until next week's visit.

## 2015-02-16 NOTE — Progress Notes (Signed)
[place]  Telephone:([phone] [phone]) [phone]     ID: [name] OB: [birth year]  MR#: [id number]  LSL#:[id number]  PCP: Sue Reichmann, MD GYN:   SU:  OTHER MD: Sue Marble, MD;  Sue Coco, MD  CHIEF COMPLAINT: Multiple myeloma, under active treatment CURRENT TREATMENT: dexamethasone,  Cyclophosphamide, zolendronate, darbepoetin  MYELOMA HISTORY: From the original consult note, dated 03/24/2014:  "The patient has a history of PE following arthroscopic surgery,as well as LLE DVT requiring Coumadin therapy since October of 2014. On 4/7 she presented to the ED with a 2 day history of hemoptysis and mucous material. INR was elevated at 6 requiring FFP and Vit K with improvement of coagulopathy (INR 1.4 this morning). Anticoagulation is on hold until hemoptysis resolves.CT angiogram on 4/7 was negative for pulmonary embolus. Bilateral pneumonia was suspected, requiring IV antibiotics.  Interestingly, diffusely heterogeneous appearance to the visualized bones were seen, more notable than on a prior CT in 2010. Bone scan on 4/8 showed old fractures in posterior 8-9 th ribs, and in a lumbar XR a new lumbar spinal fracture was noted, not picked up by the bone scan. Given the mottled appearance of the CT films,workup for multiple myeloma was initiated: She had anemia in the setting of chronic disease, iron deficiency and blood loss, and mild renal insufficiency was seen with a Cr 1.62. Sodium was normal. Her Calcium however was 12.3 on admission, today at greater than 15 receiving Calcitonin 100U nasal spray, Zometa 4 mg and hydration IV. SPEP / UPEP / IFE are pending. "  Her subsequent history is as detailed below.  INTERVAL HISTORY: [name] returns today for follow up of her multiple myeloma, accompanied by her husband [name]. Today is day 1, cycle 14 of  Cyclophosphamide weekly and dexamethasone at 20 mg a day 2 days a week. We have had to hold the bortezomib,  just as we held other medications, because of diarrhea issues. It remains unclear whether the medications axillary were the cause of the diarrhea, but the fact that she is feeling considerably better off with bortezomib and the diarrhea, which will not resolved, has subsided significantly.  REVIEW OF SYSTEMS: [name]  Had only 2 bowel movements yesterday as opposed to up to 10 a day previously. One of the bowel movements was well formed. She uses Imodium, does not like to use Questran. She is trying to walk, still within the house. She is making the bed and doing some housework, which she was not doing before. Her appetite remains poor and she has lost a total of 4 pounds by her own account. She still has chronic back pain, occasional headaches, and of course problems related to her diabetes including chronic kidney disease. A detailed review of systems otherwise was stable  PAST MEDICAL HISTORY: Past Medical History  Diagnosis Date  . Neurofibromatosis   . HTN (hypertension)   . Obesity   . Hyperlipidemia     statin intolerant. LDL is 83  . UTI (lower urinary tract infection) 05/29/12  . Pulmonary embolism 09/17/2013  . STEMI (ST elevation myocardial infarction) 05/29/2012    Inferolateral STEMI ; s/p DES to RCA, nl EF  . Multiple myeloma     Dr. Jana Taylor   . DVT (deep venous thrombosis) 08/2014    RLE  . Type II diabetes mellitus   . History of blood transfusion ?2015    "blood count dropped real low"  . Osteoarthritis, knee   . Arthritis     "  shoulders hurt" (09/30/2014)    PAST SURGICAL HISTORY: Past Surgical History  Procedure Laterality Date  . Clavicle surgery Left ~ 1960  . Ankle fracture surgery Right ?1990's  . Total knee arthroplasty Left 09/15/2013    Procedure: TOTAL KNEE ARTHROPLASTY;  Surgeon: Sue Huger, MD;  Location: [facility];  Service: Orthopedics;  Laterality: Left;  . Hernia repair    . Coronary angioplasty with stent placement  05/2012    "1"  . Tonsillectomy and  adenoidectomy  1950's  . Fracture surgery    . Bone marrow biopsy  03/2014    Dr. Jana Taylor   . Vaginal hysterectomy  1990's  . Left heart catheterization with coronary angiogram N/A 05/29/2012    Procedure: LEFT HEART CATHETERIZATION WITH CORONARY ANGIOGRAM;  Surgeon: Sue Booze, MD;  Location: [facility] CATH LAB;  Service: Cardiovascular;  Laterality: N/A;    FAMILY HISTORY Family History  Problem Relation Age of Onset  . Heart disease Mother   . Arthritis Mother   . Stroke Mother   . Heart disease Son     SOCIAL HISTORY:    (reviewed 05/19/2014)  Lives in [place] with husband of 60 years, [name]. 2 sons in good health.      ADVANCED DIRECTIVES:    HEALTH MAINTENANCE: (updated 05/19/2014)  History  Substance Use Topics  . Smoking status: Never Smoker   . Smokeless tobacco: Never Used  . Alcohol Use: No     Colonoscopy:  not on file   PAP: not on file   Bone density: not on file   Lipid panel: December 2014   Allergies  Allergen Reactions  . Codeine Nausea And Vomiting    Sees things  . Hydrocodone Nausea And Vomiting    Sees things  . Niaspan [Niacin Er] Nausea Only  . Robaxin [Methocarbamol] Other (See Comments)    Made patient feel funny  . Statins Nausea And Vomiting and Other (See Comments)    Myalgias/leg pain with atorvastatin, rosuvastatin, lovastatin, simvastatin  . Tetracycline Other (See Comments)    Pt doesn't remember reaction  . Zetia [Ezetimibe] Nausea And Vomiting  . Zithromax [Azithromycin] Nausea And Vomiting    Current Outpatient Prescriptions  Medication Sig Dispense Refill  . aspirin 81 MG tablet Take 81 mg by mouth daily.    . cetirizine (ZYRTEC) 10 MG tablet Take 10 mg by mouth daily as needed.     . cholestyramine (QUESTRAN) 4 G packet Take 1 packet (4 g total) by mouth 2 (two) times daily. 60 each 12  . clindamycin (CLINDAGEL) 1 % gel Apply topically 2 (two) times daily. 30 g 0  . dexamethasone (DECADRON) 4 MG tablet TAKE 5  TABLETS BY MOUTH ON TUESDAY AND WEDNESDAY ONLY 45 tablet 0  . glimepiride (AMARYL) 2 MG tablet Take 2 tablets by mouth every morning with breakfast.  On the days that you take prednisone or have chemotherapy take another dose at the evening meal 90 tablet 1  . glucose blood (ONE TOUCH ULTRA TEST) test strip 1 each by Other route 3 (three) times daily. 100 each 12  . guaiFENesin (MUCINEX) 600 MG 12 hr tablet Take by mouth 2 (two) times daily as needed.    . Lancets MISC Check glucose 3 times daily 100 each 6  . metoprolol tartrate (LOPRESSOR) 25 MG tablet Take 25 mg by mouth 2 (two) times daily.    . Multiple Vitamin (MULTIVITAMIN WITH MINERALS) TABS tablet Take 1 tablet by mouth at bedtime. Centrum Silver    .  Multiple Vitamins-Minerals (OCUVITE PO) Take 1 tablet by mouth daily.     Marland Kitchen NITROSTAT 0.4 MG SL tablet DISSOLVE 1 TABLET UNDER THE TONGUE EVERY 5 MINUTES AS NEEDED FOR CHEST PAIN. UP TO 3 DOSES 25 tablet 0  . nystatin (MYCOSTATIN/NYSTOP) 100000 UNIT/GM POWD Apply 1 g topically 3 (three) times daily as needed. 30 g 1  . prochlorperazine (COMPAZINE) 10 MG tablet Take 1 tablet (10 mg total) by mouth every 6 (six) hours as needed (Nausea or vomiting). 90 tablet 4  . promethazine (PHENERGAN) 50 MG tablet   0  . traMADol (ULTRAM) 50 MG tablet Take 1 tablet (50 mg total) by mouth every 6 (six) hours as needed for moderate pain. 30 tablet 2   No current facility-administered medications for this visit.    OBJECTIVE: Middle-aged white woman  In no acute distress  Filed Vitals:   02/16/15 1412  BP: 127/62  Pulse: 105  Temp: 97.9 F (36.6 C)  Resp: 18     Body mass index is 29.89 kg/(m^2).    ECOG FS:2 - Symptomatic, <50% confined to bed [facility] Weights   02/16/15 1405 02/16/15 1412  Weight: 168 lb 11.2 oz (76.522 kg) 168 lb 11.2 oz (76.522 kg)   Sclerae unicteric,  EOMs intact Oropharynx  Clear, full upper plate No cervical or supraclavicular adenopathy Lungs no rales or rhonchi Heart  regular rate and rhythm Abd soft, obese, nontender, positive bowel sounds MSK kyphosis and scoliosis but no focal spinal tenderness Neuro: nonfocal, well oriented,  positive affect Breast exam: Deferred    LAB RESULTS:  Lab Results  Component Value Date   WBC 4.7 02/16/2015   NEUTROABS 4.0 02/16/2015   HGB 8.8* 02/16/2015   HCT 27.7* 02/16/2015   MCV 83.3 02/16/2015   PLT 284 02/16/2015      Chemistry      Component Value Date/Time   NA 139 02/16/2015 1348   NA 140 11/03/2014 1326   K 5.6* 02/16/2015 1348   K 4.7 11/03/2014 1326   CL 100 11/03/2014 1326   CO2 20* 02/16/2015 1348   CO2 25 11/03/2014 1326   BUN 18.2 02/16/2015 1348   BUN 20 11/03/2014 1326   CREATININE 1.3* 02/16/2015 1348   CREATININE 0.89 11/03/2014 1326   CREATININE 0.81 12/09/2013 1000   GLU 173 11/08/2010      Component Value Date/Time   CALCIUM 8.8 02/16/2015 1348   CALCIUM 9.2 11/03/2014 1326   CALCIUM 11.1* 03/25/2014 0850   ALKPHOS 52 02/16/2015 1348   ALKPHOS 59 11/03/2014 1326   AST 37* 02/16/2015 1348   AST 22 11/03/2014 1326   ALT 8 02/16/2015 1348   ALT 24 11/03/2014 1326   BILITOT 0.53 02/16/2015 1348   BILITOT 0.6 11/03/2014 1326      STUDIES: No results found.  ASSESSMENT: 69 [place] presenting with hypercalcemia, anemia and mottled bone lesions April 2015, M-spike 3.82 g, IFE showing IgA/kappa myeloma with bone marrow biopsy 03/30/2014 with 88% plasma cells, FISH  t(4,14), deletion 13 and deletion 11 (intermediate risk); initial beta-2 microglobulin 16.  (1) considered E1A11 study, but unable to tolerate anticoagulation  (2) associated issues:  (a) coagulopathy: history of post-op PE October 2014; history of bleeding with warfarin; epistaxis with  ASA 325 ms/ day; switched to 81 mg/ day with fair tolerance, then fully coumadinized until 01/05/2015  (c) hypercalcemia: started zolendronate 03/26/2014, to be repeated every 12 weeks  (d) pain: compression  fracture L1, rib fractures: on tylenol and tramadol  (  e) immunocompromise: on Septra prophylaxis, valacyclovir and acyclovir both stopped because of uncontrolled diarrhea  (3) started dexamethasone 40 mg/ week (20 mg po on Tues and Weds April 3009); complicated by hyperglycemia with history of diabetes, followed by Dr. Everlene Taylor  (4) started bortezomib SQ 04/28/2014, to be given days 1,4, 8 and 11 of ech 21 day cycle : Held February 2016 because of diarrhea  (5) started lenalidomide first week in September 2015, held after 1 cycle with poor tolerance; resumed 09/30/2014 at 10 mg daily, days 1 through 21 of each 28 day cycle; stopped 01/02/2015, with little evidence of response  (6) cyclophosphamide started 01/05/2015 at fixed dose (approximately 300 mg/m IV), repeated weekly   (7) anemia of chronic disease/anemia secondary to chemotherapy:  darbepoetin started 02/16/2015  PLAN: [name]  Feels better and is tolerating chemotherapy much better. She still has loose bowel movements, but only twice a day and only one of them is loose. She is not using the Questran what only the Imodium. We have held the bortezomib now for some time and we are holding and also for this cycle. Essentially she is only receiving cyclophosphamide and dexamethasone for multiple myeloma.   the question is whether this is working. We are obtaining a full set of labs today so we should know in the next few days. I have asked her to make sure when she commands for Cytoxan next week to assess for me so we can go over those numbers. If we are not making any progress but rather the cancer is progressing, we will change her therapy to something more aggressive. The problem of course from the beginning has been tolerance  She qualifies for darbepoetin since she has anemia of chronic kidney disease and anemia due to chemotherapy. We are starting that today. I will check a ferritin and reticulocyte count next week. I hope to see some  improvement over the next month or 2   Daily has a good understanding of this plan. She agrees with it. She knows the goal of treatment in her case is control. She will call with any problems that may develop before her next visit here.      Chauncey Cruel, MD   02/16/2015 2:41 PM

## 2015-02-17 ENCOUNTER — Telehealth: Payer: Self-pay | Admitting: Oncology

## 2015-02-17 ENCOUNTER — Encounter: Payer: Self-pay | Admitting: Internal Medicine

## 2015-02-17 ENCOUNTER — Ambulatory Visit (INDEPENDENT_AMBULATORY_CARE_PROVIDER_SITE_OTHER): Payer: Medicare Other | Admitting: Internal Medicine

## 2015-02-17 VITALS — BP 100/58 | HR 93 | Ht 63.0 in | Wt 170.2 lb

## 2015-02-17 DIAGNOSIS — R0600 Dyspnea, unspecified: Secondary | ICD-10-CM

## 2015-02-17 DIAGNOSIS — R530 Neoplastic (malignant) related fatigue: Secondary | ICD-10-CM

## 2015-02-17 DIAGNOSIS — I259 Chronic ischemic heart disease, unspecified: Secondary | ICD-10-CM

## 2015-02-17 DIAGNOSIS — R5381 Other malaise: Secondary | ICD-10-CM | POA: Diagnosis not present

## 2015-02-17 NOTE — Telephone Encounter (Signed)
S/w pt and per pof pt needs an appt 3/22 with heather,she is booked so we have split the appts and the pt is aware  [name]

## 2015-02-17 NOTE — Patient Instructions (Addendum)
                       ICD-9-CM ICD-10-CM   1. Dyspnea 786.09 R06.00   2. Physical deconditioning 799.3 R53.81   3. Neoplastic malignant related fatigue 780.79 R53.0    Continue aspirin Continue to stay away ffrom coumadin Refer Home PT  Followup   2 months with my  NP Tammy or me

## 2015-02-17 NOTE — Progress Notes (Signed)
Subjective:    Patient ID: [name], female    DOB: 1946/03/07, 69 y.o.   MRN: [id number]  HPI   OV 12/31/2014   Follow-up pulmonary embolism-  diagnosed in October 2014 in the setting of knee surgery. I last saw her personally in January 2015. She is on antibiotic regulation with Coumadin. The plan was to reassess discontinuing anticoagulation at this visit because it has been over one year since commencement of anticoagulation. However in the interim in April 2015 got diagnosed with multiple myeloma and is being followed by Dr. Georga Hacking. At this point in time overall she is well except that she is deconditioned. Also for the last several weeks she's having moderate amount of dyspnea on exertion while walking around the household and relieved by rest. It is stable not associated with cough or chest pain. She's also had some hemoptysis periodically with cough in the setting of therapeutic INR. According to her and her husband symptoms are associated with fatigue. They also report that multiple myeloma might be under remission and so they'll stop chemotherapy. They're waiting for word from the oncologist   CT chest oct 2015 - No PE but alveolar hge v pneumonia  Walking desatiuration test in office 185 x 3 laps: 2 laps 94%, HR 101. Stopped due to dyspnea       OV 02/17/2015  Chief Complaint  Patient presents with  . Follow-up    walking long distance makes her short of breath, but if she rests a few minutes, she's okay.  some cough, but not much.  Dr. Jana Hakim wants her to start a new shot to build up red blood cells.  She refused shot due to blood clot risk.      Follow-up dyspnea and pulmonary embolism  At last visit January 2016 we checked a d-dimer and this was normal. I learned from her oncologist that her myeloma was in remission and also myeloma is not a malignancy associated with higher pulmonary embolism risk. Therefore after mutual discussion and consensus we  stopped Coumadin and switch to aspirin which she is taking currently. Overall she is well but continues to have dyspnea and exertion there is malignancy associated anemia for which she was offered erythropoietin but she has declined because of possible pulmonary embolism risk [I support her view on this]. She continues to have associated fatigue as well   Recent lab tests 02/16/2015: Hemoglobin 8.8 g percent [was 9.1 g percent 02/09/2015] creatinine of 1.3 mg percent with a GFR of 41; creatinine is unchanged from baseline in February 2016  Review of Systems  Constitutional: Negative for fever and unexpected weight change.  HENT: Negative for congestion, dental problem, ear pain, nosebleeds, postnasal drip, rhinorrhea, sinus pressure, sneezing, sore throat and trouble swallowing.   Eyes: Negative for redness and itching.  Respiratory: Positive for cough and shortness of breath. Negative for chest tightness and wheezing.   Cardiovascular: Negative for palpitations and leg swelling.  Gastrointestinal: Negative for nausea and vomiting.  Genitourinary: Negative for dysuria.  Musculoskeletal: Negative for joint swelling.  Skin: Negative for rash.  Neurological: Negative for headaches.  Hematological: Does not bruise/bleed easily.  Psychiatric/Behavioral: Negative for dysphoric mood. The patient is not nervous/anxious.      Current outpatient prescriptions:  .  aspirin 81 MG tablet, Take 81 mg by mouth daily., Disp: , Rfl:  .  cetirizine (ZYRTEC) 10 MG tablet, Take 10 mg by mouth daily as needed. , Disp: , Rfl:  .  clindamycin (CLINDAGEL) 1 % gel, Apply topically 2 (two) times daily., Disp: 30 g, Rfl: 0 .  dexamethasone (DECADRON) 4 MG tablet, TAKE 5 TABLETS BY MOUTH ON TUESDAY AND WEDNESDAY ONLY, Disp: 45 tablet, Rfl: 0 .  glimepiride (AMARYL) 2 MG tablet, Take 2 tablets by mouth every morning with breakfast.  On the days that you take prednisone or have chemotherapy take another dose at the  evening meal, Disp: 90 tablet, Rfl: 1 .  glucose blood (ONE TOUCH ULTRA TEST) test strip, 1 each by Other route 3 (three) times daily., Disp: 100 each, Rfl: 12 .  guaiFENesin (MUCINEX) 600 MG 12 hr tablet, Take by mouth 2 (two) times daily as needed., Disp: , Rfl:  .  Lancets MISC, Check glucose 3 times daily, Disp: 100 each, Rfl: 6 .  metoprolol tartrate (LOPRESSOR) 25 MG tablet, Take 25 mg by mouth 2 (two) times daily., Disp: , Rfl:  .  Multiple Vitamin (MULTIVITAMIN WITH MINERALS) TABS tablet, Take 1 tablet by mouth at bedtime. Centrum Silver, Disp: , Rfl:  .  Multiple Vitamins-Minerals (OCUVITE PO), Take 1 tablet by mouth daily. , Disp: , Rfl:  .  NITROSTAT 0.4 MG SL tablet, DISSOLVE 1 TABLET UNDER THE TONGUE EVERY 5 MINUTES AS NEEDED FOR CHEST PAIN. UP TO 3 DOSES, Disp: 25 tablet, Rfl: 0 .  prochlorperazine (COMPAZINE) 10 MG tablet, Take 1 tablet (10 mg total) by mouth every 6 (six) hours as needed (Nausea or vomiting)., Disp: 90 tablet, Rfl: 4 .  promethazine (PHENERGAN) 50 MG tablet, , Disp: , Rfl: 0 .  traMADol (ULTRAM) 50 MG tablet, Take 1 tablet (50 mg total) by mouth every 6 (six) hours as needed for moderate pain., Disp: 30 tablet, Rfl: 2      Objective:   Physical Exam  Constitutional: She is oriented to person, place, and time. She appears well-developed and well-nourished. No distress.  Very deconditioned looking female Body mass index is 30.16 kg/(m^2).   HENT:  Head: Normocephalic and atraumatic.  Right Ear: External ear normal.  Left Ear: External ear normal.  Mouth/Throat: Oropharynx is clear and moist. No oropharyngeal exudate.  Eyes: Conjunctivae and EOM are normal. Pupils are equal, round, and reactive to light. Right eye exhibits no discharge. Left eye exhibits no discharge. No scleral icterus.  Neck: Normal range of motion. Neck supple. No JVD present. No tracheal deviation present. No thyromegaly present.  Cardiovascular: Normal rate, regular rhythm, normal heart  sounds and intact distal pulses.  Exam reveals no gallop and no friction rub.   No murmur heard. Pulmonary/Chest: Effort normal and breath sounds normal. No respiratory distress. She has no wheezes. She has no rales. She exhibits no tenderness.  Abdominal: Soft. Bowel sounds are normal. She exhibits no distension and no mass. There is no tenderness. There is no rebound and no guarding.  Musculoskeletal: Normal range of motion. She exhibits no edema or tenderness.  Uses a walker and some kyphotic  Lymphadenopathy:    She has no cervical adenopathy.  Neurological: She is alert and oriented to person, place, and time. She has normal reflexes. No cranial nerve deficit. She exhibits normal muscle tone. Coordination normal.  Skin: Skin is warm and dry. No rash noted. She is not diaphoretic. No erythema. No pallor.  Psychiatric: She has a normal mood and affect. Her behavior is normal. Judgment and thought content normal.  Vitals reviewed.   Filed Vitals:   02/17/15 1429  BP: 100/58  Pulse: 93  Height: 5' 3" (  1.6 m)  Weight: 170 lb 3.2 oz (77.202 kg)  SpO2: 97%         Assessment & Plan:     ICD-9-CM ICD-10-CM   1. Dyspnea 786.09 R06.00 Ambulatory referral to Physical Therapy  2. Physical deconditioning 799.3 R53.81 Ambulatory referral to Physical Therapy  3. Neoplastic malignant related fatigue 780.79 R53.0 Ambulatory referral to Physical Therapy    I think her dyspnea is related to her anemia and physical deconditioning. Off this the physical deconditioning component appears to be larger just based on physical exam and the fact she did not desaturate with walking in the office with stopped prematurely. We will start with a home physical therapy program because she might not be fit enough for outpatient pulmonary rehabilitation. We will reevaluate her in a few months   Dr. Brand Males, M.D., [facility].C.P Pulmonary and Critical Care Medicine Staff Physician [facility] Pager: [phone], If no answer or between  15:00h - 7:00h: call [phone]  02/17/2015 3:07 PM

## 2015-02-18 ENCOUNTER — Telehealth: Payer: Self-pay | Admitting: Internal Medicine

## 2015-02-18 LAB — SPEP & IFE WITH QIG
ALBUMIN ELP: 26.2 % — AB (ref 55.8–66.1)
ALPHA-1-GLOBULIN: 3 % (ref 2.9–4.9)
Alpha-2-Globulin: 5.9 % — ABNORMAL LOW (ref 7.1–11.8)
Beta 2: 59.3 % — ABNORMAL HIGH (ref 3.2–6.5)
Beta Globulin: 4.2 % — ABNORMAL LOW (ref 4.7–7.2)
Gamma Globulin: 1.4 % — ABNORMAL LOW (ref 11.1–18.8)
IgA: 5440 mg/dL — ABNORMAL HIGH (ref 69–380)
IgG (Immunoglobin G), Serum: 174 mg/dL — ABNORMAL LOW (ref 690–1700)
IgM, Serum: <5 mg/dL — ABNORMAL LOW (ref 52–322)
M-Spike, %: 4.47 g/dL
TOTAL PROTEIN, SERUM ELECTROPHOR: 10.2 g/dL — AB (ref 6.0–8.3)

## 2015-02-18 LAB — KAPPA/LAMBDA LIGHT CHAINS
KAPPA LAMBDA RATIO: 553.33 — AB (ref 0.26–1.65)
Kappa free light chain: 16.6 mg/dL — ABNORMAL HIGH (ref 0.33–1.94)

## 2015-02-18 NOTE — Telephone Encounter (Signed)
Spoke with the pt  She states that she is calling to inform us that [facility] in [place] is the company that she has used for home health  Nothing further needed  Order was already sent to [facility]  I will forward this to them so they are aware of the name of the company  Thanks

## 2015-02-19 NOTE — Telephone Encounter (Signed)
[name] from [facility] came by and left papers for MR to sign and send back. Papers given to elise

## 2015-02-19 NOTE — Telephone Encounter (Addendum)
Form placed in MR's look-at's. Will fax back once received.

## 2015-02-22 ENCOUNTER — Other Ambulatory Visit: Payer: Self-pay | Admitting: Oncology

## 2015-02-23 ENCOUNTER — Telehealth: Payer: Self-pay | Admitting: Oncology

## 2015-02-23 ENCOUNTER — Ambulatory Visit: Payer: Medicare Other

## 2015-02-23 ENCOUNTER — Telehealth: Payer: Self-pay | Admitting: Nurse Practitioner

## 2015-02-23 ENCOUNTER — Telehealth: Payer: Self-pay | Admitting: *Deleted

## 2015-02-23 ENCOUNTER — Other Ambulatory Visit: Payer: Medicare Other

## 2015-02-23 ENCOUNTER — Other Ambulatory Visit: Payer: Self-pay | Admitting: *Deleted

## 2015-02-23 ENCOUNTER — Ambulatory Visit (HOSPITAL_BASED_OUTPATIENT_CLINIC_OR_DEPARTMENT_OTHER): Payer: Medicare Other | Admitting: Oncology

## 2015-02-23 VITALS — BP 128/78 | HR 90 | Temp 97.4°F | Resp 18 | Ht 63.0 in | Wt 167.4 lb

## 2015-02-23 DIAGNOSIS — C9000 Multiple myeloma not having achieved remission: Secondary | ICD-10-CM

## 2015-02-23 DIAGNOSIS — D638 Anemia in other chronic diseases classified elsewhere: Secondary | ICD-10-CM | POA: Diagnosis not present

## 2015-02-23 DIAGNOSIS — D6481 Anemia due to antineoplastic chemotherapy: Secondary | ICD-10-CM

## 2015-02-23 DIAGNOSIS — C9002 Multiple myeloma in relapse: Secondary | ICD-10-CM

## 2015-02-23 DIAGNOSIS — N183 Chronic kidney disease, stage 3 unspecified: Secondary | ICD-10-CM

## 2015-02-23 DIAGNOSIS — D689 Coagulation defect, unspecified: Secondary | ICD-10-CM

## 2015-02-23 DIAGNOSIS — D62 Acute posthemorrhagic anemia: Secondary | ICD-10-CM

## 2015-02-23 DIAGNOSIS — E1122 Type 2 diabetes mellitus with diabetic chronic kidney disease: Secondary | ICD-10-CM

## 2015-02-23 MED ORDER — WARFARIN SODIUM 5 MG PO TABS: 5.0000 mg | ORAL_TABLET | Freq: Every day | ORAL | Status: DC

## 2015-02-23 MED ORDER — ACYCLOVIR 400 MG PO TABS: 400.0000 mg | ORAL_TABLET | Freq: Every day | ORAL | Status: DC

## 2015-02-23 NOTE — Telephone Encounter (Signed)
S/w pt and she is aware of her appt on 3/14

## 2015-02-23 NOTE — Telephone Encounter (Signed)
Patient left voice message to triage line reporting "I have 19 Revlimid left" and to please call back with confirmation that this information was received. Dr. Jana Hakim notified of this, he states "OK" and no additional action required at this time. Patient informed of this and verbalizes understanding.

## 2015-02-23 NOTE — Telephone Encounter (Signed)
Per pof cx lab and inf and add dr gm and per pof pt is aware

## 2015-02-23 NOTE — Telephone Encounter (Signed)
appts made and avs pritned for pt,email to mw to alter-add chemo and i will call her with the 3/14   [facility]

## 2015-02-23 NOTE — Progress Notes (Signed)
[place]  [phone]) [phone] [phone]) [phone]     ID: [name] Taylor OB: [birth year]  MR#: [id number]  NUU#:[id number]  PCP: [name] Reichmann, MD GYN:   SU:  OTHER MD: [name] Marble, MD;  [name] Coco, MD  CHIEF COMPLAINT: Multiple myeloma, under active treatment CURRENT TREATMENT: dexamethasone, carfilzomib, lenalidomide, zolendronate  MYELOMA HISTORY: From the original consult note, dated 03/24/2014:  "The patient has a history of PE following arthroscopic surgery,as well as LLE DVT requiring Coumadin therapy since October of 2014. On 4/7 she presented to the ED with a 2 day history of hemoptysis and mucous material. INR was elevated at 6 requiring FFP and Vit K with improvement of coagulopathy (INR 1.4 this morning). Anticoagulation is on hold until hemoptysis resolves.CT angiogram on 4/7 was negative for pulmonary embolus. Bilateral pneumonia was suspected, requiring IV antibiotics.  Interestingly, diffusely heterogeneous appearance to the visualized bones were seen, more notable than on a prior CT in 2010. Bone scan on 4/8 showed old fractures in posterior 8-9 th ribs, and in a lumbar XR a new lumbar spinal fracture was noted, not picked up by the bone scan. Given the mottled appearance of the CT films,workup for multiple myeloma was initiated: She had anemia in the setting of chronic disease, iron deficiency and blood loss, and mild renal insufficiency was seen with a Cr 1.62. Sodium was normal. Her Calcium however was 12.3 on admission, today at greater than 15 receiving Calcitonin 100U nasal spray, Zometa 4 mg and hydration IV. SPEP / UPEP / IFE are pending. "  Her subsequent history is as detailed below.  INTERVAL HISTORY: [name] Taylor returns today for follow up of her multiple myeloma, accompanied by her husband [name] Taylor. For the past month she has been on a simplified regimen of cyclophosphamide weekly and dexamethasone weekly. Her bortezomib was stopped mid  February because of concerns regarding diarrhea. The lenalidomide had been stopped in January, because of concerns regarding whether or not it was necessary to maintain her response. [name] Taylor did much better clinically on the cyclophosphamide/dexamethasone. She felt better, and her functional status slightly improved. However her M spike has rapidly increased. I asked her to come today for an unscheduled visit so we could discuss alternative treatments.  REVIEW OF SYSTEMS: [name] Taylor has some loose bowel movements but the diarrhea is greatly improved. She feels fatigued, but is doing a little bit more around the house. She denies unusual headaches. Sometimes she can't see very well she says. She can feel dizzy. She uses a walker most of the time. There have been no fevers or bleeding problems and specifically no epistaxis recently. She has no appetite at all although she says her sense of taste is okay. Her peripheral neuropathy remains grade 1.A detailed review of systems was otherwise stable.  PAST MEDICAL HISTORY: Past Medical History  Diagnosis Date  . Neurofibromatosis   . HTN (hypertension)   . Obesity   . Hyperlipidemia     statin intolerant. LDL is 83  . UTI (lower urinary tract infection) 05/29/12  . Pulmonary embolism 09/17/2013  . STEMI (ST elevation myocardial infarction) 05/29/2012    Inferolateral STEMI ; s/p DES to RCA, nl EF  . Multiple myeloma     Dr. Jana Taylor   . DVT (deep venous thrombosis) 08/2014    RLE  . Type II diabetes mellitus   . History of blood transfusion ?2015    "blood count dropped real low"  . Osteoarthritis, knee   . Arthritis     "  shoulders hurt" (09/30/2014)    PAST SURGICAL HISTORY: Past Surgical History  Procedure Laterality Date  . Clavicle surgery Left ~ 1960  . Ankle fracture surgery Right ?1990's  . Total knee arthroplasty Left 09/15/2013    Procedure: TOTAL KNEE ARTHROPLASTY;  Surgeon: [name] Huger, MD;  Location: [facility];  Service: Orthopedics;   Laterality: Left;  . Hernia repair    . Coronary angioplasty with stent placement  05/2012    "1"  . Tonsillectomy and adenoidectomy  1950's  . Fracture surgery    . Bone marrow biopsy  03/2014    Dr. Jana Taylor   . Vaginal hysterectomy  1990's  . Left heart catheterization with coronary angiogram N/A 05/29/2012    Procedure: LEFT HEART CATHETERIZATION WITH CORONARY ANGIOGRAM;  Surgeon: [name] Booze, MD;  Location[facility] CATH LAB;  Service: Cardiovascular;  Laterality: N/A;    FAMILY HISTORY Family History  Problem Relation Age of Onset  . Heart disease Mother   . Arthritis Mother   . Stroke Mother   . Heart disease Son     SOCIAL HISTORY:    (reviewed 05/19/2014)  Lives in [place] with husband of 71 years, [name] Taylor. 2 sons in good health.      ADVANCED DIRECTIVES:    HEALTH MAINTENANCE: (updated 05/19/2014)  History  Substance Use Topics  . Smoking status: Never Smoker   . Smokeless tobacco: Never Used  . Alcohol Use: No     Colonoscopy:  not on file   PAP: not on file   Bone density: not on file   Lipid panel: December 2014   Allergies  Allergen Reactions  . Codeine Nausea And Vomiting    Sees things  . Hydrocodone Nausea And Vomiting    Sees things  . Niaspan [Niacin Er] Nausea Only  . Robaxin [Methocarbamol] Other (See Comments)    Made patient feel funny  . Statins Nausea And Vomiting and Other (See Comments)    Myalgias/leg pain with atorvastatin, rosuvastatin, lovastatin, simvastatin  . Tetracycline Other (See Comments)    Pt doesn't remember reaction  . Zetia [Ezetimibe] Nausea And Vomiting  . Zithromax [Azithromycin] Nausea And Vomiting    Current Outpatient Prescriptions  Medication Sig Dispense Refill  . acyclovir (ZOVIRAX) 400 MG tablet Take 1 tablet (400 mg total) by mouth daily. 30 tablet 3  . cetirizine (ZYRTEC) 10 MG tablet Take 10 mg by mouth daily as needed.     . clindamycin (CLINDAGEL) 1 % gel Apply topically 2 (two) times daily. 30 g  0  . dexamethasone (DECADRON) 4 MG tablet TAKE 5 TABLETS BY MOUTH ON [name] AND WEDNESDAY ONLY 45 tablet 0  . glimepiride (AMARYL) 2 MG tablet Take 2 tablets by mouth every morning with breakfast.  On the days that you take prednisone or have chemotherapy take another dose at the evening meal 90 tablet 1  . glucose blood (ONE TOUCH ULTRA TEST) test strip 1 each by Other route 3 (three) times daily. 100 each 12  . guaiFENesin (MUCINEX) 600 MG 12 hr tablet Take by mouth 2 (two) times daily as needed.    . Lancets MISC Check glucose 3 times daily 100 each 6  . metoprolol tartrate (LOPRESSOR) 25 MG tablet Take 25 mg by mouth 2 (two) times daily.    . Multiple Vitamin (MULTIVITAMIN WITH MINERALS) TABS tablet Take 1 tablet by mouth at bedtime. Centrum Silver    . Multiple Vitamins-Minerals (OCUVITE PO) Take 1 tablet by mouth daily.     Marland Kitchen  NITROSTAT 0.4 MG SL tablet DISSOLVE 1 TABLET UNDER THE TONGUE EVERY 5 MINUTES AS NEEDED FOR CHEST PAIN. UP TO 3 DOSES 25 tablet 0  . prochlorperazine (COMPAZINE) 10 MG tablet Take 1 tablet (10 mg total) by mouth every 6 (six) hours as needed (Nausea or vomiting). 90 tablet 4  . promethazine (PHENERGAN) 50 MG tablet   0  . traMADol (ULTRAM) 50 MG tablet Take 1 tablet (50 mg total) by mouth every 6 (six) hours as needed for moderate pain. 30 tablet 2  . warfarin (COUMADIN) 5 MG tablet Take 1 tablet (5 mg total) by mouth daily. 60 tablet 4   No current facility-administered medications for this visit.    OBJECTIVE: Middle-aged white woman who appears chronically ill  Filed Vitals:   02/23/15 1209  BP: 128/78  Pulse: 90  Temp: 97.4 F (36.3 C)  Resp: 18     Body mass index is 29.66 kg/(m^2).    ECOG FS:2 - Symptomatic, <50% confined to bed [facility] Weights   02/23/15 1209  Weight: 167 lb 6.4 oz (75.932 kg)   Sclerae unicteric,pupils round and equal Oropharynx clear, full upper plate, no mucosal lesions noted No cervical or supraclavicular adenopathy Lungs no  rales or rhonchi Heart regular rate and rhythm Abd soft, obese, nontender, positive bowel sounds MSK kyphosis and scoliosis but no focal spinal tenderness Neuro: nonfocal, well oriented, anxious affect Breast exam: Deferred    LAB RESULTS: Results for DINIA, JOYNT (MRN [id number]) as of 02/23/2015 19:07  Ref. Range 12/01/2014 12:42 12/22/2014 09:10 01/01/2015 15:31 01/26/2015 11:59 02/16/2015 13:48  IgA Latest Range: 69-380 mg/dL  2650 (H)   5440 (H)   Results for KYMIA, SIMI (MRN [id number]) as of 02/23/2015 19:07  Ref. Range 12/01/2014 12:42 12/22/2014 09:10 01/01/2015 15:31 01/26/2015 11:59 02/16/2015 13:48  M-SPIKE, % Latest Units: g/dL 0.36 0.36 0.35 0.45 4.47  Results for MALERIE, EAKINS (MRN [id number]) as of 02/23/2015 19:07  Ref. Range 12/01/2014 12:42 12/22/2014 09:10 01/01/2015 15:31 01/26/2015 11:59 02/16/2015 13:48  Kappa free light chain Latest Range: 0.33-1.94 mg/dL 17.00 (H) 48.70 (H) 50.40 (H) 124.00 (H) 16.60 (H)     Lab Results  Component Value Date   WBC 4.7 02/16/2015   NEUTROABS 4.0 02/16/2015   HGB 8.8* 02/16/2015   HCT 27.7* 02/16/2015   MCV 83.3 02/16/2015   PLT 284 02/16/2015      Chemistry      Component Value Date/Time   NA 139 02/16/2015 1348   NA 140 11/03/2014 1326   K 5.6* 02/16/2015 1348   K 4.7 11/03/2014 1326   CL 100 11/03/2014 1326   CO2 20* 02/16/2015 1348   CO2 25 11/03/2014 1326   BUN 18.2 02/16/2015 1348   BUN 20 11/03/2014 1326   CREATININE 1.3* 02/16/2015 1348   CREATININE 0.89 11/03/2014 1326   CREATININE 0.81 12/09/2013 1000   GLU 173 11/08/2010      Component Value Date/Time   CALCIUM 8.8 02/16/2015 1348   CALCIUM 9.2 11/03/2014 1326   CALCIUM 11.1* 03/25/2014 0850   ALKPHOS 52 02/16/2015 1348   ALKPHOS 59 11/03/2014 1326   AST 37* 02/16/2015 1348   AST 22 11/03/2014 1326   ALT 8 02/16/2015 1348   ALT 24 11/03/2014 1326   BILITOT 0.53 02/16/2015 1348   BILITOT 0.6 11/03/2014 1326      STUDIES:  CLINICAL DATA:  Shortness of breath for 3 weeks, hemoptysis for 5 days, history hypertension, diabetes, pulmonary embolism, multiple myeloma  EXAM:  CHEST 2 VIEW  COMPARISON: 10/07/2014  FINDINGS: Minimal enlargement of cardiac silhouette.  Mediastinal contours and pulmonary vascularity normal.  Lungs clear.  No pleural effusion or pneumothorax.  Bones demineralized.  Compression deformities of adjacent vertebrae at the thoracolumbar junction new at one level and progressive at adjacent level since bone survey 04/13/2014 but little changed since 10/07/2014.  IMPRESSION: Compression deformities of adjacent vertebrae at the thoracolumbar junction progressive since 04/13/2014 but little changed since 10/07/2014.   Electronically Signed  By: Lavonia Dana M.D.  On: 12/31/2014 20:00       ASSESSMENT: 69 [place] presenting with hypercalcemia, anemia and mottled bone lesions April 2015, M-spike 3.82 g, IFE showing IgA/kappa myeloma with bone marrow biopsy 03/30/2014 with 88% plasma cells, FISH  t(4,14), deletion 13 and deletion 11; initial beta-2 microglobulin 16.  (1) considered E1A11 study, but unable to tolerate anticoagulation  (2) associated issues:  (a) coagulopathy: history of post-op PE October 2014; history of bleeding with warfarin; epistaxis with  ASA 325 ms/ day; switched to 81 mg/ day with fair tolerance, then fully coumadinized until 01/05/2015  (c) hypercalcemia: started zolendronate 03/26/2014, to be repeated every 12 weeks  (d) pain: compression fracture L1, rib fractures: on tylenol and tramadol  (e) immunocompromise: on Septra prophylaxis, valacyclovir and acyclovir both stopped because of uncontrolled diarrhea  (3) started dexamethasone 40 mg/ week (20 mg po on Tues and Weds April 0938); complicated by hyperglycemia with history of diabetes, followed by Dr. Everlene Farrier  (4) started bortezomib SQ 04/28/2014, to be given days 1,4, 8 and 11 of ech 21  day cycle; stopped February 2016 because of diarrhea  (5) started lenalidomide first week in September 2015, held after 1 cycle with poor tolerance; resumed 09/30/2014 at 10 mg daily, days 1 through 21 of each 28 day cycle; stopped 01/02/2015, with questionable evidence of response  (6) cyclophosphamide started 01/05/2015 at fixed dose (approximately 300 mg/m IV), repeated weekly until March 2016, when it was discontinued secondary to progression   (7) anemia of chronic disease/anemia secondary to chemotherapy:  darbepoetin considered but omittedsecondary to clotting risk with resumption of immune modulators  (8) starting carfilzomib 03/02/2015, with dexamethasone weekly; to start lenalidomide once INR in the therapeutic range (target date 03/08/2015)  (a) coumadin started 02/23/2015  (b) acyclovir started 02/23/2015  PLAN: I met for approximately 45 minutes with Hassan Rowan and her husband going over her situation. The jump in her M spike was so abruptly actually had it checked by the lab, which confirmed it. It goes along with a rise in her total IgA and her total protein.  Accordingly we are stopping the cyclophosphamide, which was not controlling her disease. We are going to go back to a similar treatment as she had before, but we're going to substitute carfilzomib for bortezomib. I am not going to resume the lenalidomide until she has a therapeutic INR (1.8-2.5 being the range in her case, given her propensity to bleed). Of course she will continue the dexamethasone 40 mg weekly as before.  Today I sent a prescription for warfarin and also for acyclovir. We will be checking her INR initially on a weekly basis together with her CBC and routine labs. I went to obtain a baseline SPEP, IgA level, and A lambda levels on 3:15 and then every 28 days. I am hoping we will be able to get Jaylnn back into partial remissionwith these changes. If not I would probably next switch the lenalidomide 2 [place] little  mild, and if  that did not work we would have to consider switching to a daratumumab combination   Laconya has a good understanding of this plan.She is aware of the possible toxicities, side effects and complications as well as benefits. In particular she understands the risk of clotting and the risk of bleeding also in her case. She agrees with the plan.She knows the goal of treatment in her case is control. She will call with any problems that may develop before her next visit here.      Chauncey Cruel, MD   02/23/2015 1:33 PM

## 2015-02-23 NOTE — Telephone Encounter (Signed)
Per staff message and POF I have scheduled appts. Advised scheduler of appts. JMW  

## 2015-02-24 ENCOUNTER — Telehealth: Payer: Self-pay | Admitting: *Deleted

## 2015-02-24 NOTE — Telephone Encounter (Signed)
Patient called requesting to speak with a nurse. Returned call, patient had question about rx for acyclovit that was called in with her warfarin. Advised patient that Dr. Jana Hakim did prescribe for her to take this daily. Patient verbalized understanding.

## 2015-03-01 ENCOUNTER — Ambulatory Visit (HOSPITAL_BASED_OUTPATIENT_CLINIC_OR_DEPARTMENT_OTHER): Payer: Medicare Other

## 2015-03-01 ENCOUNTER — Other Ambulatory Visit: Payer: Self-pay | Admitting: Emergency Medicine

## 2015-03-01 ENCOUNTER — Other Ambulatory Visit (HOSPITAL_BASED_OUTPATIENT_CLINIC_OR_DEPARTMENT_OTHER): Payer: Medicare Other

## 2015-03-01 DIAGNOSIS — Z5112 Encounter for antineoplastic immunotherapy: Secondary | ICD-10-CM | POA: Diagnosis not present

## 2015-03-01 DIAGNOSIS — C9000 Multiple myeloma not having achieved remission: Secondary | ICD-10-CM

## 2015-03-01 DIAGNOSIS — C9002 Multiple myeloma in relapse: Secondary | ICD-10-CM

## 2015-03-01 DIAGNOSIS — D689 Coagulation defect, unspecified: Secondary | ICD-10-CM | POA: Diagnosis not present

## 2015-03-01 LAB — CBC WITH DIFFERENTIAL/PLATELET
BASO%: 0.8 % (ref 0.0–2.0)
BASOS ABS: 0 10*3/uL (ref 0.0–0.1)
EOS%: 1.5 % (ref 0.0–7.0)
Eosinophils Absolute: 0.1 10*3/uL (ref 0.0–0.5)
HCT: 26.7 % — ABNORMAL LOW (ref 34.8–46.6)
HGB: 8.2 g/dL — ABNORMAL LOW (ref 11.6–15.9)
LYMPH#: 1.6 10*3/uL (ref 0.9–3.3)
LYMPH%: 29.9 % (ref 14.0–49.7)
MCH: 26.8 pg (ref 25.1–34.0)
MCHC: 30.7 g/dL — AB (ref 31.5–36.0)
MCV: 87.3 fL (ref 79.5–101.0)
MONO#: 1 10*3/uL — ABNORMAL HIGH (ref 0.1–0.9)
MONO%: 18.8 % — ABNORMAL HIGH (ref 0.0–14.0)
NEUT#: 2.6 10*3/uL (ref 1.5–6.5)
NEUT%: 49 % (ref 38.4–76.8)
PLATELETS: 187 10*3/uL (ref 145–400)
RBC: 3.06 10*6/uL — AB (ref 3.70–5.45)
RDW: 24.8 % — AB (ref 11.2–14.5)
WBC: 5.3 10*3/uL (ref 3.9–10.3)

## 2015-03-01 LAB — PROTIME-INR
INR: 3.6 — ABNORMAL HIGH (ref 2.00–3.50)
PROTIME: 43.2 s — AB (ref 10.6–13.4)

## 2015-03-01 LAB — COMPREHENSIVE METABOLIC PANEL (CC13)
ALT: 18 U/L (ref 0–55)
AST: 37 U/L — AB (ref 5–34)
Albumin: 2.1 g/dL — ABNORMAL LOW (ref 3.5–5.0)
Alkaline Phosphatase: 44 U/L (ref 40–150)
Anion Gap: 13 mEq/L — ABNORMAL HIGH (ref 3–11)
BILIRUBIN TOTAL: 0.49 mg/dL (ref 0.20–1.20)
BUN: 16.9 mg/dL (ref 7.0–26.0)
CHLORIDE: 104 meq/L (ref 98–109)
CO2: 22 meq/L (ref 22–29)
CREATININE: 1.2 mg/dL — AB (ref 0.6–1.1)
Calcium: 8.7 mg/dL (ref 8.4–10.4)
EGFR: 46 mL/min/{1.73_m2} — ABNORMAL LOW (ref >90)
GLUCOSE: 115 mg/dL (ref 70–140)
Potassium: 4.6 mEq/L (ref 3.5–5.1)
Sodium: 139 mEq/L (ref 136–145)
Total Protein: 10.2 g/dL — ABNORMAL HIGH (ref 6.4–8.3)

## 2015-03-01 LAB — TECHNOLOGIST REVIEW

## 2015-03-01 LAB — POCT INR: INR: 3.6

## 2015-03-01 MED ORDER — DEXTROSE 5 % IV SOLN
20.0000 mg/m2 | Freq: Once | INTRAVENOUS | Status: AC
  Administered 2015-03-01: 36 mg via INTRAVENOUS
  Filled 2015-03-01: qty 18

## 2015-03-01 MED ORDER — SODIUM CHLORIDE 0.9 % IV SOLN
Freq: Once | INTRAVENOUS | Status: AC
  Administered 2015-03-01: 15:00:00 via INTRAVENOUS

## 2015-03-01 MED ORDER — SODIUM CHLORIDE 0.9 % IV SOLN
Freq: Once | INTRAVENOUS | Status: AC
  Administered 2015-03-01: 15:00:00 via INTRAVENOUS
  Filled 2015-03-01: qty 4

## 2015-03-01 NOTE — Patient Instructions (Signed)
[facility] Discharge Instructions for Patients Receiving Chemotherapy  Today you received the following chemotherapy agents Kyprolis To help prevent nausea and vomiting after your treatment, we encourage you to take your nausea medication as prescribed.  If you develop nausea and vomiting that is not controlled by your nausea medication, call the clinic.   BELOW ARE SYMPTOMS THAT SHOULD BE REPORTED IMMEDIATELY:  *FEVER GREATER THAN 100.5 F  *CHILLS WITH OR WITHOUT FEVER  NAUSEA AND VOMITING THAT IS NOT CONTROLLED WITH YOUR NAUSEA MEDICATION  *UNUSUAL SHORTNESS OF BREATH  *UNUSUAL BRUISING OR BLEEDING  TENDERNESS IN MOUTH AND THROAT WITH OR WITHOUT PRESENCE OF ULCERS  *URINARY PROBLEMS  *BOWEL PROBLEMS  UNUSUAL RASH Items with * indicate a potential emergency and should be followed up as soon as possible.  Feel free to call the clinic you have any questions or concerns. The clinic phone number is [phone].    

## 2015-03-02 ENCOUNTER — Ambulatory Visit (HOSPITAL_BASED_OUTPATIENT_CLINIC_OR_DEPARTMENT_OTHER): Payer: Medicare Other

## 2015-03-02 ENCOUNTER — Ambulatory Visit (INDEPENDENT_AMBULATORY_CARE_PROVIDER_SITE_OTHER): Payer: Self-pay | Admitting: Pharmacist

## 2015-03-02 ENCOUNTER — Other Ambulatory Visit: Payer: Medicare Other

## 2015-03-02 DIAGNOSIS — Z5112 Encounter for antineoplastic immunotherapy: Secondary | ICD-10-CM

## 2015-03-02 DIAGNOSIS — C9000 Multiple myeloma not having achieved remission: Secondary | ICD-10-CM

## 2015-03-02 DIAGNOSIS — D689 Coagulation defect, unspecified: Secondary | ICD-10-CM

## 2015-03-02 DIAGNOSIS — C9002 Multiple myeloma in relapse: Secondary | ICD-10-CM

## 2015-03-02 MED ORDER — SODIUM CHLORIDE 0.9 % IJ SOLN
10.0000 mL | INTRAMUSCULAR | Status: DC | PRN
  Filled 2015-03-02: qty 10

## 2015-03-02 MED ORDER — DEXTROSE 5 % IV SOLN
20.0000 mg/m2 | Freq: Once | INTRAVENOUS | Status: AC
  Administered 2015-03-02: 36 mg via INTRAVENOUS
  Filled 2015-03-02: qty 18

## 2015-03-02 MED ORDER — SODIUM CHLORIDE 0.9 % IV SOLN
Freq: Once | INTRAVENOUS | Status: AC
  Administered 2015-03-02: 11:00:00 via INTRAVENOUS

## 2015-03-02 MED ORDER — SODIUM CHLORIDE 0.9 % IV SOLN
Freq: Once | INTRAVENOUS | Status: AC
Start: 2015-03-02 — End: 2015-03-02
  Administered 2015-03-02: 11:00:00 via INTRAVENOUS

## 2015-03-02 MED ORDER — SODIUM CHLORIDE 0.9 % IV SOLN
Freq: Once | INTRAVENOUS | Status: AC
  Administered 2015-03-02: 11:00:00 via INTRAVENOUS
  Filled 2015-03-02: qty 4

## 2015-03-02 MED ORDER — HEPARIN SOD (PORK) LOCK FLUSH 100 UNIT/ML IV SOLN
500.0000 [IU] | Freq: Once | INTRAVENOUS | Status: DC | PRN
  Filled 2015-03-02: qty 5

## 2015-03-02 NOTE — Patient Instructions (Signed)
[facility] Discharge Instructions for Patients Receiving Chemotherapy  Today you received the following chemotherapy agents Kyprolis To help prevent nausea and vomiting after your treatment, we encourage you to take your nausea medication as prescribed.  If you develop nausea and vomiting that is not controlled by your nausea medication, call the clinic.   BELOW ARE SYMPTOMS THAT SHOULD BE REPORTED IMMEDIATELY:  *FEVER GREATER THAN 100.5 F  *CHILLS WITH OR WITHOUT FEVER  NAUSEA AND VOMITING THAT IS NOT CONTROLLED WITH YOUR NAUSEA MEDICATION  *UNUSUAL SHORTNESS OF BREATH  *UNUSUAL BRUISING OR BLEEDING  TENDERNESS IN MOUTH AND THROAT WITH OR WITHOUT PRESENCE OF ULCERS  *URINARY PROBLEMS  *BOWEL PROBLEMS  UNUSUAL RASH Items with * indicate a potential emergency and should be followed up as soon as possible.  Feel free to call the clinic you have any questions or concerns. The clinic phone number is [phone].    

## 2015-03-02 NOTE — Progress Notes (Signed)
INR above goal at 3.6 (Goal of 1.8-2.5 per Dr. Jana Hakim due to increased bleed risk) Pt seen in infusion area on 03/01/15 during kyprolis infusion Pt is doing well She has restarted coumadin since 3/8 since she will be beginning revlimid now that her INR is above goal Patient started on 5 mg daily This is higher than her last previous dose Will have patient hold tonight and then decrease her coumadin dose Pt also asked if she could eat spinach/greens tonight. This will be fine.  No unusual bleeding or bruising No missed or extra doses  Plan: Hold coumadin tonight.  Then decrease to 2.5 mg (half tablet) daily except for 5 mg (whole tablet) on Mon/Wed/Fri.  Recheck next week with scheduled appointments on 3/21. Lab at 10:15am, MD visit at 10:45am and Treatment at 12:45pm. We will see you in the infusion area.

## 2015-03-02 NOTE — Patient Instructions (Signed)
INR above goal Hold coumadin tonight.  Then decrease to 2.5 mg (half tablet) daily except for 5 mg (whole tablet) on Mon/Wed/Fri.  Patient was on 5 mg daily last week.  Recheck next week with scheduled appointments on 3/21. Lab at 10:15am, MD visit at 10:45am and Treatment at 12:45pm. We will see you in the infusion area.

## 2015-03-03 ENCOUNTER — Telehealth: Payer: Self-pay | Admitting: Oncology

## 2015-03-03 NOTE — Telephone Encounter (Signed)
per Gerald Stabs in [place] to add CC on 3/21-pt aware

## 2015-03-08 ENCOUNTER — Ambulatory Visit (HOSPITAL_COMMUNITY)
Admission: RE | Admit: 2015-03-08 | Discharge: 2015-03-08 | Disposition: A | Discharge status: Home / Self Care | Payer: Medicare Other | Source: Ambulatory Visit | Attending: Oncology | Admitting: Oncology

## 2015-03-08 ENCOUNTER — Ambulatory Visit: Payer: Medicare Other

## 2015-03-08 ENCOUNTER — Ambulatory Visit (HOSPITAL_BASED_OUTPATIENT_CLINIC_OR_DEPARTMENT_OTHER): Payer: Self-pay | Admitting: Pharmacist

## 2015-03-08 ENCOUNTER — Telehealth: Payer: Self-pay | Admitting: Nurse Practitioner

## 2015-03-08 ENCOUNTER — Encounter: Payer: Self-pay | Admitting: Nurse Practitioner

## 2015-03-08 ENCOUNTER — Other Ambulatory Visit: Payer: Self-pay | Admitting: *Deleted

## 2015-03-08 ENCOUNTER — Other Ambulatory Visit: Payer: Medicare Other

## 2015-03-08 ENCOUNTER — Telehealth: Payer: Self-pay | Admitting: *Deleted

## 2015-03-08 ENCOUNTER — Ambulatory Visit (HOSPITAL_BASED_OUTPATIENT_CLINIC_OR_DEPARTMENT_OTHER): Payer: Medicare Other | Admitting: Nurse Practitioner

## 2015-03-08 ENCOUNTER — Other Ambulatory Visit (HOSPITAL_BASED_OUTPATIENT_CLINIC_OR_DEPARTMENT_OTHER): Payer: Medicare Other

## 2015-03-08 VITALS — BP 97/43 | HR 89 | Temp 98.9°F | Resp 17 | Ht 63.0 in | Wt 168.3 lb

## 2015-03-08 DIAGNOSIS — T451X5A Adverse effect of antineoplastic and immunosuppressive drugs, initial encounter: Secondary | ICD-10-CM

## 2015-03-08 DIAGNOSIS — D689 Coagulation defect, unspecified: Secondary | ICD-10-CM

## 2015-03-08 DIAGNOSIS — D6481 Anemia due to antineoplastic chemotherapy: Secondary | ICD-10-CM | POA: Diagnosis present

## 2015-03-08 DIAGNOSIS — Z86711 Personal history of pulmonary embolism: Secondary | ICD-10-CM

## 2015-03-08 DIAGNOSIS — C9000 Multiple myeloma not having achieved remission: Secondary | ICD-10-CM

## 2015-03-08 DIAGNOSIS — R197 Diarrhea, unspecified: Secondary | ICD-10-CM

## 2015-03-08 DIAGNOSIS — C9002 Multiple myeloma in relapse: Secondary | ICD-10-CM

## 2015-03-08 LAB — TECHNOLOGIST REVIEW

## 2015-03-08 LAB — COMPREHENSIVE METABOLIC PANEL (CC13)
ALT: 22 U/L (ref 0–55)
AST: 35 U/L — AB (ref 5–34)
Albumin: 1.9 g/dL — ABNORMAL LOW (ref 3.5–5.0)
Alkaline Phosphatase: 50 U/L (ref 40–150)
Anion Gap: 14 mEq/L — ABNORMAL HIGH (ref 3–11)
BUN: 13.7 mg/dL (ref 7.0–26.0)
CHLORIDE: 105 meq/L (ref 98–109)
CO2: 19 mEq/L — ABNORMAL LOW (ref 22–29)
CREATININE: 1.3 mg/dL — AB (ref 0.6–1.1)
Calcium: 8.2 mg/dL — ABNORMAL LOW (ref 8.4–10.4)
EGFR: 42 mL/min/{1.73_m2} — ABNORMAL LOW (ref >90)
Glucose: 184 mg/dl — ABNORMAL HIGH (ref 70–140)
POTASSIUM: 4.4 meq/L (ref 3.5–5.1)
Sodium: 138 mEq/L (ref 136–145)
Total Bilirubin: 0.85 mg/dL (ref 0.20–1.20)
Total Protein: 9.4 g/dL — ABNORMAL HIGH (ref 6.4–8.3)

## 2015-03-08 LAB — CBC WITH DIFFERENTIAL/PLATELET
BASO%: 0.9 % (ref 0.0–2.0)
Basophils Absolute: 0.1 10*3/uL (ref 0.0–0.1)
EOS%: 2.1 % (ref 0.0–7.0)
Eosinophils Absolute: 0.1 10*3/uL (ref 0.0–0.5)
HCT: 23.6 % — ABNORMAL LOW (ref 34.8–46.6)
HGB: 7.4 g/dL — ABNORMAL LOW (ref 11.6–15.9)
LYMPH%: 33.3 % (ref 14.0–49.7)
MCH: 27.2 pg (ref 25.1–34.0)
MCHC: 31.4 g/dL — AB (ref 31.5–36.0)
MCV: 86.8 fL (ref 79.5–101.0)
MONO#: 1 10*3/uL — ABNORMAL HIGH (ref 0.1–0.9)
MONO%: 17.3 % — ABNORMAL HIGH (ref 0.0–14.0)
NEUT%: 46.4 % (ref 38.4–76.8)
NEUTROS ABS: 2.6 10*3/uL (ref 1.5–6.5)
Platelets: 149 10*3/uL (ref 145–400)
RBC: 2.72 10*6/uL — AB (ref 3.70–5.45)
RDW: 25.3 % — AB (ref 11.2–14.5)
WBC: 5.7 10*3/uL (ref 3.9–10.3)
lymph#: 1.9 10*3/uL (ref 0.9–3.3)

## 2015-03-08 LAB — HOLD TUBE, BLOOD BANK

## 2015-03-08 LAB — PROTIME-INR
INR: 3.7 — AB (ref 2.00–3.50)
Protime: 44.4 Seconds — ABNORMAL HIGH (ref 10.6–13.4)

## 2015-03-08 LAB — POCT INR: INR: 3.7

## 2015-03-08 MED ORDER — SODIUM CHLORIDE 0.9 % IJ SOLN
10.0000 mL | INTRAMUSCULAR | Status: DC | PRN
  Filled 2015-03-08: qty 10

## 2015-03-08 MED ORDER — SODIUM CHLORIDE 0.9 % IV SOLN: Freq: Once | INTRAVENOUS | Status: DC

## 2015-03-08 MED ORDER — TRAMADOL HCL 50 MG PO TABS: 50.0000 mg | ORAL_TABLET | Freq: Four times a day (QID) | ORAL | Status: DC | PRN

## 2015-03-08 MED ORDER — HEPARIN SOD (PORK) LOCK FLUSH 100 UNIT/ML IV SOLN
500.0000 [IU] | Freq: Once | INTRAVENOUS | Status: DC
  Filled 2015-03-08: qty 5

## 2015-03-08 NOTE — Telephone Encounter (Signed)
per Nira Conn to add pt a lab appt in today for type & cross

## 2015-03-08 NOTE — Telephone Encounter (Signed)
Called pt to let her know that she needs to come back to lab today or early morning tomorrow to have blood drawn for her transfusion on tomorrow. No answer to neither number listed, but I left a detailed message for pt suggesting what she needs to do. I also left message for pt to call this nurse back @ [phone]. I will continue to call pt until I reach her. Message to be forwarded to Gentry Fitz, NP.

## 2015-03-08 NOTE — Telephone Encounter (Signed)
per pof to move trmt to after Magrinat appt-sent email to add another trmt 3/30-gave pt copy of sch

## 2015-03-08 NOTE — Progress Notes (Signed)
INR remains above goal after decreasing coumadin dose last week.  No changes in meds.  No bleeding or bruising.  Hgb= 7.4 today so Ms Frane with receive PRBC tomorrow.  Chemo is cancelled for today.  Will hold coumadin today and decrease dose by about 20% to 2.5mg  daily but 5mg  on Mondays.  Will check PT/INR next week with MD appt.

## 2015-03-08 NOTE — Progress Notes (Signed)
[name]  [phone] [phone]     ID: [name] OB: 09-02-1946  MR#: [id number]  HYI#:[id number]  PCP: Sue Reichmann, MD GYN:   SU:  OTHER MD: Sue Marble, MD;  Sue Coco, MD  CHIEF COMPLAINT: Multiple myeloma, under active treatment CURRENT TREATMENT: dexamethasone, carfilzomib, lenalidomide, zolendronate  MYELOMA HISTORY: From the original consult note, dated 03/24/2014:  "The patient has a history of PE following arthroscopic surgery,as well as LLE DVT requiring Coumadin therapy since October of 2014. On 4/7 she presented to the ED with a 2 day history of hemoptysis and mucous material. INR was elevated at 6 requiring FFP and Vit K with improvement of coagulopathy (INR 1.4 this morning). Anticoagulation is on hold until hemoptysis resolves.CT angiogram on 4/7 was negative for pulmonary embolus. Bilateral pneumonia was suspected, requiring IV antibiotics.  Interestingly, diffusely heterogeneous appearance to the visualized bones were seen, more notable than on a prior CT in 2010. Bone scan on 4/8 showed old fractures in posterior 8-9 th ribs, and in a lumbar XR a new lumbar spinal fracture was noted, not picked up by the bone scan. Given the mottled appearance of the CT films,workup for multiple myeloma was initiated: She had anemia in the setting of chronic disease, iron deficiency and blood loss, and mild renal insufficiency was seen with a Cr 1.62. Sodium was normal. Her Calcium however was 12.3 on admission, today at greater than 15 receiving Calcitonin 100U nasal spray, Zometa 4 mg and hydration IV. SPEP / UPEP / IFE are pending. "  Her subsequent history is as detailed below.  INTERVAL HISTORY: [name] returns today for follow up of her multiple myeloma, accompanied by her husband [name]. She started on carfilzomib last week, and would have started the lenalidomide today, but her INR is still out of range. Today she is very  fatigued and short of breath. She complains of dizziness as well. Her hgb is down to 7.4 today.  REVIEW OF SYSTEMS: [name] denies fevers or chills. She had some mild nausea yesterday but it has eased off now. She is having diarrhea again and believes it related to starting back on the acyclovir, and she is having up to 5-6 stools daily. She is using imodium PRN for this. Her appetite is decreased. She doesn't sleep well. She has pain to her right shoulder and right lower back, but is out of tramadol. A detailed review of systems is otherwise stable.   PAST MEDICAL HISTORY: Past Medical History  Diagnosis Date  . Neurofibromatosis   . HTN (hypertension)   . Obesity   . Hyperlipidemia     statin intolerant. LDL is 83  . UTI (lower urinary tract infection) 05/29/12  . Pulmonary embolism 09/17/2013  . STEMI (ST elevation myocardial infarction) 05/29/2012    Inferolateral STEMI ; s/p DES to RCA, nl EF  . Multiple myeloma     Dr. Jana Taylor   . DVT (deep venous thrombosis) 08/2014    RLE  . Type II diabetes mellitus   . History of blood transfusion ?2015    "blood count dropped real low"  . Osteoarthritis, knee   . Arthritis     "shoulders hurt" (09/30/2014)    PAST SURGICAL HISTORY: Past Surgical History  Procedure Laterality Date  . Clavicle surgery Left ~ 1960  . Ankle fracture surgery Right ?1990's  . Total knee arthroplasty Left 09/15/2013    Procedure: TOTAL KNEE ARTHROPLASTY;  Surgeon: Sue Huger, MD;  Location: [facility]  OR;  Service: Orthopedics;  Laterality: Left;  . Hernia repair    . Coronary angioplasty with stent placement  05/2012    "1"  . Tonsillectomy and adenoidectomy  1950's  . Fracture surgery    . Bone marrow biopsy  03/2014    Dr. Jana Taylor   . Vaginal hysterectomy  1990's  . Left heart catheterization with coronary angiogram N/A 05/29/2012    Procedure: LEFT HEART CATHETERIZATION WITH CORONARY ANGIOGRAM;  Surgeon: Sue Booze, MD;  Location: [facility] CATH LAB;   Service: Cardiovascular;  Laterality: N/A;    FAMILY HISTORY Family History  Problem Relation Age of Onset  . Heart disease Mother   . Arthritis Mother   . Stroke Mother   . Heart disease Son     SOCIAL HISTORY:    (reviewed 05/19/2014)  Lives in [place] with husband of 60 years, [name]. 2 sons in good health.      ADVANCED DIRECTIVES:    HEALTH MAINTENANCE: (updated 05/19/2014)  History  Substance Use Topics  . Smoking status: Never Smoker   . Smokeless tobacco: Never Used  . Alcohol Use: No     Colonoscopy:  not on file   PAP: not on file   Bone density: not on file   Lipid panel: December 2014   Allergies  Allergen Reactions  . Codeine Nausea And Vomiting    Sees things  . Hydrocodone Nausea And Vomiting    Sees things  . Niaspan [Niacin Er] Nausea Only  . Robaxin [Methocarbamol] Other (See Comments)    Made patient feel funny  . Statins Nausea And Vomiting and Other (See Comments)    Myalgias/leg pain with atorvastatin, rosuvastatin, lovastatin, simvastatin  . Tetracycline Other (See Comments)    Pt doesn't remember reaction  . Zetia [Ezetimibe] Nausea And Vomiting  . Zithromax [Azithromycin] Nausea And Vomiting    Current Outpatient Prescriptions  Medication Sig Dispense Refill  . acyclovir (ZOVIRAX) 400 MG tablet Take 1 tablet (400 mg total) by mouth daily. 30 tablet 3  . cetirizine (ZYRTEC) 10 MG tablet Take 10 mg by mouth daily as needed.     . clindamycin (CLINDAGEL) 1 % gel Apply topically 2 (two) times daily. 30 g 0  . dexamethasone (DECADRON) 4 MG tablet TAKE 5 TABLETS BY MOUTH ON TUESDAY AND WEDNESDAY ONLY 45 tablet 0  . glimepiride (AMARYL) 2 MG tablet Take 2 tablets by mouth every morning with breakfast.  On the days that you take prednisone or have chemotherapy take another dose at the evening meal 90 tablet 1  . glucose blood (ONE TOUCH ULTRA TEST) test strip 1 each by Other route 3 (three) times daily. 100 each 12  . guaiFENesin (MUCINEX)  600 MG 12 hr tablet Take by mouth 2 (two) times daily as needed.    . Lancets MISC Check glucose 3 times daily 100 each 6  . metoprolol tartrate (LOPRESSOR) 25 MG tablet Take 25 mg by mouth 2 (two) times daily.    . Multiple Vitamin (MULTIVITAMIN WITH MINERALS) TABS tablet Take 1 tablet by mouth at bedtime. Centrum Silver    . Multiple Vitamins-Minerals (OCUVITE PO) Take 1 tablet by mouth daily.     Marland Kitchen NITROSTAT 0.4 MG SL tablet DISSOLVE 1 TABLET UNDER THE TONGUE EVERY 5 MINUTES AS NEEDED FOR CHEST PAIN. UP TO 3 DOSES 25 tablet 0  . prochlorperazine (COMPAZINE) 10 MG tablet Take 1 tablet (10 mg total) by mouth every 6 (six) hours as needed (Nausea or vomiting).  90 tablet 4  . promethazine (PHENERGAN) 50 MG tablet   0  . traMADol (ULTRAM) 50 MG tablet Take 1 tablet (50 mg total) by mouth every 6 (six) hours as needed for moderate pain. 30 tablet 2  . warfarin (COUMADIN) 5 MG tablet Take 1 tablet (5 mg total) by mouth daily. 60 tablet 4   No current facility-administered medications for this visit.    OBJECTIVE: Middle-aged white woman who appears chronically ill, ambulating with walker Filed Vitals:   03/08/15 1024  BP: 97/43  Pulse: 89  Temp: 98.9 F (37.2 C)  Resp: 17     Body mass index is 29.82 kg/(m^2).    ECOG FS:2 - Symptomatic, <50% confined to bed Filed Weights   03/08/15 1024  Weight: 168 lb 4.8 oz (76.34 kg)   Skin: warm, dry  HEENT: sclerae anicteric, conjunctivae pink, oropharynx clear. No thrush or mucositis.  Lymph Nodes: No cervical or supraclavicular lymphadenopathy  Lungs: clear to auscultation bilaterally, no rales, wheezes, or rhonci, clear labored breathing Heart: regular rate and rhythm  Abdomen: round, soft, non tender, positive bowel sounds  Musculoskeletal: No focal spinal tenderness, no peripheral edema , Neuro: non focal, well oriented, tearful affect  Breasts: deferred   LAB RESULTS: Results for Sue, Taylor (MRN [id number]) as of 02/23/2015 19:07   Ref. Range 12/01/2014 12:42 12/22/2014 09:10 01/01/2015 15:31 01/26/2015 11:59 02/16/2015 13:48  IgA Latest Range: 69-380 mg/dL  2650 (H)   5440 (H)   Results for Sue, Taylor (MRN [id number]) as of 02/23/2015 19:07  Ref. Range 12/01/2014 12:42 12/22/2014 09:10 01/01/2015 15:31 01/26/2015 11:59 02/16/2015 13:48  M-SPIKE, % Latest Units: g/dL 0.36 0.36 0.35 0.45 4.47  Results for Sue, Taylor (MRN [id number]) as of 02/23/2015 19:07  Ref. Range 12/01/2014 12:42 12/22/2014 09:10 01/01/2015 15:31 01/26/2015 11:59 02/16/2015 13:48  Kappa free light chain Latest Range: 0.33-1.94 mg/dL 17.00 (H) 48.70 (H) 50.40 (H) 124.00 (H) 16.60 (H)     Lab Results  Component Value Date   WBC 5.7 03/08/2015   NEUTROABS 2.6 03/08/2015   HGB 7.4* 03/08/2015   HCT 23.6* 03/08/2015   MCV 86.8 03/08/2015   PLT 149 03/08/2015      Chemistry      Component Value Date/Time   NA 138 03/08/2015 1005   NA 140 11/03/2014 1326   K 4.4 03/08/2015 1005   K 4.7 11/03/2014 1326   CL 100 11/03/2014 1326   CO2 19* 03/08/2015 1005   CO2 25 11/03/2014 1326   BUN 13.7 03/08/2015 1005   BUN 20 11/03/2014 1326   CREATININE 1.3* 03/08/2015 1005   CREATININE 0.89 11/03/2014 1326   CREATININE 0.81 12/09/2013 1000   GLU 173 11/08/2010      Component Value Date/Time   CALCIUM 8.2* 03/08/2015 1005   CALCIUM 9.2 11/03/2014 1326   CALCIUM 11.1* 03/25/2014 0850   ALKPHOS 50 03/08/2015 1005   ALKPHOS 59 11/03/2014 1326   AST 35* 03/08/2015 1005   AST 22 11/03/2014 1326   ALT 22 03/08/2015 1005   ALT 24 11/03/2014 1326   BILITOT 0.85 03/08/2015 1005   BILITOT 0.6 11/03/2014 1326      STUDIES:  No results found.  ASSESSMENT: 69 y.o.Marland KitchenHenry woman presenting with hypercalcemia, anemia and mottled bone lesions April 2015, M-spike 3.82 g, IFE showing IgA/kappa myeloma with bone marrow biopsy 03/30/2014 with 88% plasma cells, FISH  t(4,14), deletion 13 and deletion 11; initial beta-2 microglobulin 16.  (1) considered E1A11  study, but unable  to tolerate anticoagulation  (2) associated issues:  (a) coagulopathy: history of post-op PE October 2014; history of bleeding with warfarin; epistaxis with  ASA 325 ms/ day; switched to 81 mg/ day with fair tolerance, then fully coumadinized until 01/05/2015  (c) hypercalcemia: started zolendronate 03/26/2014, to be repeated every 12 weeks  (d) pain: compression fracture L1, rib fractures: on tylenol and tramadol  (e) immunocompromise: on Septra prophylaxis, valacyclovir and acyclovir both stopped because of uncontrolled diarrhea  (3) started dexamethasone 40 mg/ week (20 mg po on Tues and Weds April 9249); complicated by hyperglycemia with history of diabetes, followed by Dr. Everlene Farrier  (4) started bortezomib SQ 04/28/2014, to be given days 1,4, 8 and 11 of ech 21 day cycle; stopped February 2016 because of diarrhea  (5) started lenalidomide first week in September 2015, held after 1 cycle with poor tolerance; resumed 09/30/2014 at 10 mg daily, days 1 through 21 of each 28 day cycle; stopped 01/02/2015, with questionable evidence of response  (6) cyclophosphamide started 01/05/2015 at fixed dose (approximately 300 mg/m IV), repeated weekly until March 2016, when it was discontinued secondary to progression   (7) anemia of chronic disease/anemia secondary to chemotherapy:  darbepoetin considered but omittedsecondary to clotting risk with resumption of immune modulators  (8) starting carfilzomib 03/02/2015, with dexamethasone weekly; to start lenalidomide once INR in the therapeutic range (target date 03/15/2015)  (a) coumadin started 02/23/2015  (b) acyclovir started 02/23/2015  PLAN: [name] is visibly fatigued and winded in office today. The labs were reviewed in detail and her hgb is down to 7.4. I consulted with Dr. Jana Taylor and we will hold treatment this week, and she will return for 2 units PRBC tomorrow at the [facility].   At this time her INR is still not in  therapeutic range. We will continue to hold the start of the lenalidomide until this is under better control. She will visit with the [facility] later today.  I have advised [name] to stay well hydrated and continue to use her imodium as directed for her diarrhea. I have refilled her tramadol for pain.  [name] will return to clinic next Tuesday for labs, an office visit, and her next carfilzomib infusion. She understands and agrees with this plan. She has been encouraged to call with any issues that might arise before her next visit here.  Sue Panda, NP   03/08/2015 12:15 PM

## 2015-03-09 ENCOUNTER — Other Ambulatory Visit: Payer: Medicare Other

## 2015-03-09 ENCOUNTER — Ambulatory Visit (HOSPITAL_COMMUNITY)
Admission: RE | Admit: 2015-03-09 | Discharge: 2015-03-09 | Disposition: A | Discharge status: Home / Self Care | Payer: Medicare Other | Source: Ambulatory Visit | Attending: Oncology | Admitting: Oncology

## 2015-03-09 ENCOUNTER — Telehealth: Payer: Self-pay | Admitting: Oncology

## 2015-03-09 ENCOUNTER — Ambulatory Visit: Payer: Medicare Other

## 2015-03-09 VITALS — BP 133/79 | HR 95 | Temp 98.0°F | Resp 20

## 2015-03-09 DIAGNOSIS — D6481 Anemia due to antineoplastic chemotherapy: Secondary | ICD-10-CM | POA: Diagnosis not present

## 2015-03-09 DIAGNOSIS — T451X5A Adverse effect of antineoplastic and immunosuppressive drugs, initial encounter: Principal | ICD-10-CM

## 2015-03-09 LAB — PREPARE RBC (CROSSMATCH)

## 2015-03-09 MED ORDER — SODIUM CHLORIDE 0.9 % IJ SOLN: 10.0000 mL | INTRAMUSCULAR | Status: DC | PRN

## 2015-03-09 MED ORDER — SODIUM CHLORIDE 0.9 % IV SOLN
Freq: Once | INTRAVENOUS | Status: AC
  Administered 2015-03-09: 11:00:00 via INTRAVENOUS

## 2015-03-09 MED ORDER — HEPARIN SOD (PORK) LOCK FLUSH 100 UNIT/ML IV SOLN: 500.0000 [IU] | Freq: Once | INTRAVENOUS | Status: DC

## 2015-03-09 MED ORDER — ACETAMINOPHEN 325 MG PO TABS
650.0000 mg | ORAL_TABLET | Freq: Once | ORAL | Status: AC
  Administered 2015-03-09: 650 mg via ORAL
  Filled 2015-03-09: qty 2

## 2015-03-09 MED ORDER — DIPHENHYDRAMINE HCL 25 MG PO CAPS
25.0000 mg | ORAL_CAPSULE | Freq: Once | ORAL | Status: AC
  Administered 2015-03-09: 25 mg via ORAL
  Filled 2015-03-09: qty 1

## 2015-03-09 NOTE — Telephone Encounter (Signed)
per Melissa from [facility] to sch CC-pt aware

## 2015-03-09 NOTE — Procedures (Signed)
[facility]  Procedure Note  [name] YHO:[id number] DOB: [birth year] DOA: 03/09/2015   Dr. Jana Hakim   Associated Diagnosis: A06.01,V61.1X5A  Procedure Note: IV started, 2 units of PRBC's transfused per order, IV discontinued   Condition During Procedure:  Patient stable, denies any complaints   Condition at Discharge:  Patient stable.  Transported to lobby via wheelchair where husband is waiting to transport.   Roberto Scales, RN  [facility]

## 2015-03-10 ENCOUNTER — Other Ambulatory Visit: Payer: Self-pay | Admitting: Oncology

## 2015-03-10 ENCOUNTER — Other Ambulatory Visit: Payer: Self-pay | Admitting: Nurse Practitioner

## 2015-03-10 LAB — IGA: IGA: 5080 mg/dL — AB (ref 69–380)

## 2015-03-10 LAB — KAPPA/LAMBDA LIGHT CHAINS
KAPPA FREE LGHT CHN: 12.2 mg/dL — AB (ref 0.33–1.94)
KAPPA LAMBDA RATIO: 406.67 — AB (ref 0.26–1.65)

## 2015-03-10 LAB — PROTEIN ELECTROPHORESIS, SERUM
ABNORMAL PROTEIN BAND2: 0.1 g/dL
ALBUMIN ELP: 2.3 g/dL — AB (ref 3.8–4.8)
ALPHA-2-GLOBULIN: 0.4 g/dL — AB (ref 0.5–0.9)
Abnormal Protein Band1: 4.3 g/dL
Alpha-1-Globulin: 0.2 g/dL (ref 0.2–0.3)
BETA 2: 5.5 g/dL — AB (ref 0.2–0.5)
BETA GLOBULIN: 0.4 g/dL (ref 0.4–0.6)
Gamma Globulin: 0.2 g/dL — ABNORMAL LOW (ref 0.8–1.7)
Total Protein, Serum Electrophoresis: 9 g/dL — ABNORMAL HIGH (ref 6.1–8.1)

## 2015-03-10 LAB — TYPE AND SCREEN
ABO/RH(D): B POS
ANTIBODY SCREEN: NEGATIVE
UNIT DIVISION: 0
Unit division: 0

## 2015-03-15 ENCOUNTER — Ambulatory Visit: Payer: Medicare Other

## 2015-03-15 ENCOUNTER — Other Ambulatory Visit: Payer: Medicare Other

## 2015-03-16 ENCOUNTER — Ambulatory Visit: Payer: Medicare Other | Admitting: Pharmacist

## 2015-03-16 ENCOUNTER — Ambulatory Visit (HOSPITAL_BASED_OUTPATIENT_CLINIC_OR_DEPARTMENT_OTHER): Payer: Medicare Other | Admitting: Oncology

## 2015-03-16 ENCOUNTER — Telehealth: Payer: Self-pay | Admitting: Oncology

## 2015-03-16 ENCOUNTER — Ambulatory Visit (HOSPITAL_BASED_OUTPATIENT_CLINIC_OR_DEPARTMENT_OTHER): Payer: Medicare Other

## 2015-03-16 ENCOUNTER — Ambulatory Visit: Payer: Medicare Other

## 2015-03-16 ENCOUNTER — Other Ambulatory Visit: Payer: Medicare Other

## 2015-03-16 ENCOUNTER — Other Ambulatory Visit (HOSPITAL_BASED_OUTPATIENT_CLINIC_OR_DEPARTMENT_OTHER): Payer: Medicare Other

## 2015-03-16 DIAGNOSIS — Z5112 Encounter for antineoplastic immunotherapy: Secondary | ICD-10-CM

## 2015-03-16 DIAGNOSIS — Z86711 Personal history of pulmonary embolism: Secondary | ICD-10-CM

## 2015-03-16 DIAGNOSIS — C9002 Multiple myeloma in relapse: Secondary | ICD-10-CM

## 2015-03-16 DIAGNOSIS — I2699 Other pulmonary embolism without acute cor pulmonale: Secondary | ICD-10-CM

## 2015-03-16 DIAGNOSIS — D630 Anemia in neoplastic disease: Secondary | ICD-10-CM | POA: Diagnosis not present

## 2015-03-16 DIAGNOSIS — E1122 Type 2 diabetes mellitus with diabetic chronic kidney disease: Secondary | ICD-10-CM

## 2015-03-16 DIAGNOSIS — C9000 Multiple myeloma not having achieved remission: Secondary | ICD-10-CM

## 2015-03-16 DIAGNOSIS — D72829 Elevated white blood cell count, unspecified: Secondary | ICD-10-CM

## 2015-03-16 DIAGNOSIS — I82401 Acute embolism and thrombosis of unspecified deep veins of right lower extremity: Secondary | ICD-10-CM

## 2015-03-16 DIAGNOSIS — D6481 Anemia due to antineoplastic chemotherapy: Secondary | ICD-10-CM

## 2015-03-16 DIAGNOSIS — D689 Coagulation defect, unspecified: Secondary | ICD-10-CM

## 2015-03-16 DIAGNOSIS — B029 Zoster without complications: Secondary | ICD-10-CM

## 2015-03-16 DIAGNOSIS — N183 Chronic kidney disease, stage 3 (moderate): Secondary | ICD-10-CM

## 2015-03-16 LAB — COMPREHENSIVE METABOLIC PANEL (CC13)
ALK PHOS: 61 U/L (ref 40–150)
ALT: 15 U/L (ref 0–55)
ANION GAP: 14 meq/L — AB (ref 3–11)
AST: 37 U/L — AB (ref 5–34)
Albumin: 2.2 g/dL — ABNORMAL LOW (ref 3.5–5.0)
BUN: 17.5 mg/dL (ref 7.0–26.0)
CO2: 18 meq/L — AB (ref 22–29)
CREATININE: 1.4 mg/dL — AB (ref 0.6–1.1)
Calcium: 9.1 mg/dL (ref 8.4–10.4)
Chloride: 106 mEq/L (ref 98–109)
EGFR: 37 mL/min/{1.73_m2} — AB (ref >90)
GLUCOSE: 172 mg/dL — AB (ref 70–140)
Potassium: 4.8 mEq/L (ref 3.5–5.1)
Sodium: 138 mEq/L (ref 136–145)
Total Bilirubin: 1.48 mg/dL — ABNORMAL HIGH (ref 0.20–1.20)
Total Protein: 10.1 g/dL — ABNORMAL HIGH (ref 6.4–8.3)

## 2015-03-16 LAB — MANUAL DIFFERENTIAL
ALC: 3 10*3/uL (ref 0.9–3.3)
ANC (CHCC MAN DIFF): 6.1 10*3/uL (ref 1.5–6.5)
BAND NEUTROPHILS: 3 % (ref 0–10)
Basophil: 0 % (ref 0–2)
Blasts: 0 % (ref 0–0)
EOS%: 3 % (ref 0–7)
LYMPH: 19 % (ref 14–49)
METAMYELOCYTES PCT: 1 % — AB (ref 0–0)
MONO: 6 % (ref 0–14)
Myelocytes: 0 % (ref 0–0)
NRBC: 2 % — AB (ref 0–0)
Other Cell: 33 % — ABNORMAL HIGH (ref 0–0)
PLT EST: DECREASED
PROMYELO: 0 % (ref 0–0)
SEG: 35 % — AB (ref 38–77)
Variant Lymph: 0 % (ref 0–0)

## 2015-03-16 LAB — PROTIME-INR
INR: 1.9 — ABNORMAL LOW (ref 2.00–3.50)
PROTIME: 22.8 s — AB (ref 10.6–13.4)

## 2015-03-16 LAB — CBC WITH DIFFERENTIAL/PLATELET
HEMATOCRIT: 30.3 % — AB (ref 34.8–46.6)
HGB: 9.6 g/dL — ABNORMAL LOW (ref 11.6–15.9)
MCH: 26 pg (ref 25.1–34.0)
MCHC: 31.7 g/dL (ref 31.5–36.0)
MCV: 81.9 fL (ref 79.5–101.0)
Platelets: 104 10*3/uL — ABNORMAL LOW (ref 145–400)
RBC: 3.7 10*6/uL (ref 3.70–5.45)
RDW: 29.8 % — AB (ref 11.2–14.5)
WBC: 15.7 10*3/uL — ABNORMAL HIGH (ref 3.9–10.3)

## 2015-03-16 LAB — POCT INR: INR: 1.9

## 2015-03-16 MED ORDER — DEXTROSE 5 % IV SOLN
20.0000 mg/m2 | Freq: Once | INTRAVENOUS | Status: AC
  Administered 2015-03-16: 36 mg via INTRAVENOUS
  Filled 2015-03-16: qty 18

## 2015-03-16 MED ORDER — SODIUM CHLORIDE 0.9 % IV SOLN
Freq: Once | INTRAVENOUS | Status: AC
  Administered 2015-03-16: 14:00:00 via INTRAVENOUS

## 2015-03-16 MED ORDER — SODIUM CHLORIDE 0.9 % IV SOLN
Freq: Once | INTRAVENOUS | Status: AC
  Administered 2015-03-16: 15:00:00 via INTRAVENOUS
  Filled 2015-03-16: qty 4

## 2015-03-16 NOTE — Telephone Encounter (Signed)
per Carolann Littler in [place] to sch pt CC-pt aware

## 2015-03-16 NOTE — Progress Notes (Signed)
[place]  [phone]) [phone] Fax:(336) [phone]     ID: [name] OB: 1946/11/25  MR#: [id number]  HWK#:[id number]  PCP: Sue Reichmann, MD GYN:   SU:  OTHER MD: Sue Marble, MD;  Sue Coco, MD  CHIEF COMPLAINT: Multiple myeloma, under active treatment CURRENT TREATMENT: dexamethasone, carfilzomib, lenalidomide, zolendronate  MYELOMA HISTORY: From the original consult note, dated 03/24/2014:  "The patient has a history of PE following arthroscopic surgery,as well as LLE DVT requiring Coumadin therapy since October of 2014. On 4/7 she presented to the ED with a 2 day history of hemoptysis and mucous material. INR was elevated at 6 requiring FFP and Vit K with improvement of coagulopathy (INR 1.4 this morning). Anticoagulation is on hold until hemoptysis resolves.CT angiogram on 4/7 was negative for pulmonary embolus. Bilateral pneumonia was suspected, requiring IV antibiotics.  Interestingly, diffusely heterogeneous appearance to the visualized bones were seen, more notable than on a prior CT in 2010. Bone scan on 4/8 showed old fractures in posterior 8-9 th ribs, and in a lumbar XR a new lumbar spinal fracture was noted, not picked up by the bone scan. Given the mottled appearance of the CT films,workup for multiple myeloma was initiated: She had anemia in the setting of chronic disease, iron deficiency and blood loss, and mild renal insufficiency was seen with a Cr 1.62. Sodium was normal. Her Calcium however was 12.3 on admission, today at greater than 15 receiving Calcitonin 100U nasal spray, Zometa 4 mg and hydration IV. SPEP / UPEP / IFE are pending. "  Her subsequent history is as detailed below.  INTERVAL HISTORY: [name] returns today for follow up of her multiple myeloma, accompanied by her husband [name]. She felt considerably better after receiving the 2 units of blood last week. She is ready to resume the carfilzomib today (this is day 8 cycle 1,  which had to be delayed a week because of the anemia problem).  REVIEW OF SYSTEMS: [name] continues to have a very poor functional status, and just above the only activity she reports is making her bed. The rest of the day she is on the couch, not necessarily horizontal however. She has some nausea which she has treated successfully with the pills. She was feeling dizzy before the transfusion but that has resolved. She denies headaches or visual changes. She has had some pain in the right neck area, which is intermittent. She controls that with tramadol. She is not constipated from that medication. She has had severe itching in her right back which he wanted that look at today (this turned out to be shingles). She has had no fevers, and no bleeding that she is aware of. Her appetite is poor. Her breathing she thinks improved with the transfusion. Her blood sugars are "okay". A detailed review of systems today was otherwise stable  PAST MEDICAL HISTORY: Past Medical History  Diagnosis Date  . Neurofibromatosis   . HTN (hypertension)   . Obesity   . Hyperlipidemia     statin intolerant. LDL is 83  . UTI (lower urinary tract infection) 05/29/12  . Pulmonary embolism 09/17/2013  . STEMI (ST elevation myocardial infarction) 05/29/2012    Inferolateral STEMI ; s/p DES to RCA, nl EF  . Multiple myeloma     Dr. Jana Taylor   . DVT (deep venous thrombosis) 08/2014    RLE  . Type II diabetes mellitus   . History of blood transfusion ?2015    "blood count dropped real  low"  . Osteoarthritis, knee   . Arthritis     "shoulders hurt" (09/30/2014)    PAST SURGICAL HISTORY: Past Surgical History  Procedure Laterality Date  . Clavicle surgery Left ~ 1960  . Ankle fracture surgery Right ?1990's  . Total knee arthroplasty Left 09/15/2013    Procedure: TOTAL KNEE ARTHROPLASTY;  Surgeon: Sue Huger, MD;  Location: [facility];  Service: Orthopedics;  Laterality: Left;  . Hernia repair    . Coronary angioplasty  with stent placement  05/2012    "1"  . Tonsillectomy and adenoidectomy  1950's  . Fracture surgery    . Bone marrow biopsy  03/2014    Dr. Jana Taylor   . Vaginal hysterectomy  1990's  . Left heart catheterization with coronary angiogram N/A 05/29/2012    Procedure: LEFT HEART CATHETERIZATION WITH CORONARY ANGIOGRAM;  Surgeon: Sue Booze, MD;  Location: [facility] CATH LAB;  Service: Cardiovascular;  Laterality: N/A;    FAMILY HISTORY Family History  Problem Relation Age of Onset  . Heart disease Mother   . Arthritis Mother   . Stroke Mother   . Heart disease Son     SOCIAL HISTORY:    (reviewed 05/19/2014)  Lives in [place] with husband of 54 years, [name]. 2 sons in good health, one living within 2 blocks of the patient, one in [place]. 2 grandchildren     ADVANCED DIRECTIVES: in place   HEALTH MAINTENANCE: (updated 05/19/2014)  History  Substance Use Topics  . Smoking status: Never Smoker   . Smokeless tobacco: Never Used  . Alcohol Use: No     Colonoscopy:  not on file   PAP: not on file   Bone density: not on file   Lipid panel: December 2014   Allergies  Allergen Reactions  . Codeine Nausea And Vomiting    [name]  . Hydrocodone Nausea And Vomiting    [name]  . Niaspan [Niacin Er] Nausea Only  . Robaxin [Methocarbamol] Other (See Comments)    Made patient feel funny  . Statins Nausea And Vomiting and Other (See Comments)    Myalgias/leg pain with atorvastatin, rosuvastatin, lovastatin, simvastatin  . Tetracycline Other (See Comments)    Pt doesn't remember reaction  . Zetia [Ezetimibe] Nausea And Vomiting  . Zithromax [Azithromycin] Nausea And Vomiting    Current Outpatient Prescriptions  Medication Sig Dispense Refill  . acyclovir (ZOVIRAX) 400 MG tablet Take 1 tablet (400 mg total) by mouth daily. 30 tablet 3  . cetirizine (ZYRTEC) 10 MG tablet Take 10 mg by mouth daily as needed.     . clindamycin (CLINDAGEL) 1 % gel Apply topically 2  (two) times daily. 30 g 0  . dexamethasone (DECADRON) 4 MG tablet TAKE 5 TABLETS BY MOUTH WEEKLY ON TUESDAY AND WEDNESDAY ONLY 45 tablet 0  . glimepiride (AMARYL) 2 MG tablet Take 2 tablets by mouth every morning with breakfast.  On the days that you take prednisone or have chemotherapy take another dose at the evening meal 90 tablet 1  . glucose blood (ONE TOUCH ULTRA TEST) test strip 1 each by Other route 3 (three) times daily. 100 each 12  . guaiFENesin (MUCINEX) 600 MG 12 hr tablet Take by mouth 2 (two) times daily as needed.    . Lancets MISC Check glucose 3 times daily 100 each 6  . metoprolol tartrate (LOPRESSOR) 25 MG tablet Take 25 mg by mouth 2 (two) times daily.    . Multiple Vitamin (MULTIVITAMIN WITH MINERALS) TABS  tablet Take 1 tablet by mouth at bedtime. Centrum Silver    . Multiple Vitamins-Minerals (OCUVITE PO) Take 1 tablet by mouth daily.     Marland Kitchen NITROSTAT 0.4 MG SL tablet DISSOLVE 1 TABLET UNDER THE TONGUE EVERY 5 MINUTES AS NEEDED FOR CHEST PAIN. UP TO 3 DOSES 25 tablet 0  . prochlorperazine (COMPAZINE) 10 MG tablet Take 1 tablet (10 mg total) by mouth every 6 (six) hours as needed (Nausea or vomiting). 90 tablet 4  . promethazine (PHENERGAN) 50 MG tablet   0  . traMADol (ULTRAM) 50 MG tablet Take 1 tablet (50 mg total) by mouth every 6 (six) hours as needed for moderate pain. 30 tablet 2  . warfarin (COUMADIN) 5 MG tablet Take 1 tablet (5 mg total) by mouth daily. 60 tablet 4   No current facility-administered medications for this visit.    OBJECTIVE: Middle-aged white woman ambulating with walker Filed Vitals:   03/16/15 1225  BP: 105/47  Pulse: 84  Resp: 18     Body mass index is 28.99 kg/(m^2).    ECOG FS:2 - Symptomatic, <50% confined to bed Filed Weights   03/16/15 1225  Weight: 163 lb 9.6 oz (74.208 kg)   Sclerae unicteric, EOMs intact Oropharynx cleart-- no thrush or other lesions No cervical or supraclavicular adenopathy Lungs no rales or rhonchi Heart  regular rate and rhythm Abd soft, nontender, positive bowel sounds MSK kyphosis and scoliosis but no focal spinal tenderness, Neuro: nonfocal, well oriented, depressed affect Breasts: Deferred Skin: In the mid lower right back there are some punctate lesions, no vesicles, strongly suggestive of shingles   LAB RESULTS: Results for JENA, TEGELER (MRN [id number]) as of 03/16/2015 13:54  Ref. Range 10/16/2014 12:44 11/13/2014 12:47 12/22/2014 09:10 02/16/2015 13:48 03/08/2015 10:05  IgA Latest Range: 69-380 mg/dL 1790 (H) 1470 (H) 2650 (H) 5440 (H) 5080 (H)  Results for SHELVIE, SALSBERRY (MRN [id number]) as of 03/16/2015 13:54  Ref. Range 12/01/2014 12:42 12/22/2014 09:10 01/01/2015 15:31 01/26/2015 11:59 02/16/2015 13:48  M-SPIKE, % Latest Units: g/dL 0.36 0.36 0.35 0.45 4.47   Results for TANIKKA, BRESNAN (MRN [id number]) as of 03/16/2015 13:54  Ref. Range 12/22/2014 09:10 01/01/2015 15:31 01/26/2015 11:59 02/16/2015 13:48 03/08/2015 10:05  Kappa:Lambda Ratio Latest Range: 0.26-1.65  NOT CALC 420.00 (H) NOT CALC 553.33 (H) 406.67 (H)    Lab Results  Component Value Date   WBC 15.7* 03/16/2015   NEUTROABS 2.6 03/08/2015   HGB 9.6* 03/16/2015   HCT 30.3* 03/16/2015   MCV 81.9 03/16/2015   PLT 104* 03/16/2015      Chemistry      Component Value Date/Time   NA 138 03/16/2015 1138   NA 140 11/03/2014 1326   K 4.8 03/16/2015 1138   K 4.7 11/03/2014 1326   CL 100 11/03/2014 1326   CO2 18* 03/16/2015 1138   CO2 25 11/03/2014 1326   BUN 17.5 03/16/2015 1138   BUN 20 11/03/2014 1326   CREATININE 1.4* 03/16/2015 1138   CREATININE 0.89 11/03/2014 1326   CREATININE 0.81 12/09/2013 1000   GLU 173 11/08/2010      Component Value Date/Time   CALCIUM 9.1 03/16/2015 1138   CALCIUM 9.2 11/03/2014 1326   CALCIUM 11.1* 03/25/2014 0850   ALKPHOS 61 03/16/2015 1138   ALKPHOS 59 11/03/2014 1326   AST 37* 03/16/2015 1138   AST 22 11/03/2014 1326   ALT 15 03/16/2015 1138   ALT 24 11/03/2014 1326    BILITOT 1.48* 03/16/2015 1138  BILITOT 0.6 11/03/2014 1326      STUDIES: No results found.   ASSESSMENT: 69 [place] presenting with hypercalcemia, anemia and mottled bone lesions April 2015, M-spike 3.82 g, IFE showing IgA/kappa myeloma with bone marrow biopsy 03/30/2014 with 88% plasma cells, FISH  t(4,14), deletion 13 and deletion 11; initial beta-2 microglobulin 16.  (1) considered E1A11 study, but unable to tolerate anticoagulation  (2) associated issues:  (a) coagulopathy: history of post-op PE October 2014; history of bleeding with warfarin; epistaxis with  ASA 325 ms/ day; switched to 81 mg/ day with fair tolerance, then fully coumadinized until 01/05/2015  (c) hypercalcemia: started zolendronate 03/26/2014, to be repeated every 12 weeks  (d) pain: compression fracture L1, rib fractures: on tylenol and tramadol  (e) immunocompromise: on Septra prophylaxis, valacyclovir and acyclovir both stopped because of uncontrolled diarrhea  (3) started dexamethasone 40 mg/ week (20 mg po on Tues and Weds April 0539); complicated by hyperglycemia with history of diabetes, followed by Dr. Everlene Farrier  (4) started bortezomib SQ 04/28/2014, to be given days 1,4, 8 and 11 of ech 21 day cycle; stopped February 2016 because of diarrhea  (5) started lenalidomide first week in September 2015, held after 1 cycle with poor tolerance; resumed 09/30/2014 at 10 mg daily, days 1 through 21 of each 28 day cycle; stopped 01/02/2015, with questionable evidence of response  (6) cyclophosphamide started 01/05/2015 at fixed dose (approximately 300 mg/m IV), repeated weekly until March 2016, when it was discontinued secondary to progression   (7) anemia of chronic disease/anemia secondary to chemotherapy:  darbepoetin considered but omittedsecondary to clotting risk with resumption of immune modulators  (8) starting carfilzomib 03/02/2015, with continuing dexamethasone weekly; to start lenalidomide once  INR in the therapeutic range (target date 03/15/2015)  (a) coumadin started 02/23/2015  (b) acyclovir started 02/23/2015  (c) lenalidomide started 03/16/2015  PLAN: Review of Amalya's blood film today shows circulating plasma cells, explaining her slightly elevated white cell count. 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 This is ominous. I told her that I was very worried about this and made her an appointment to see me April 4, suggesting that her 2 children might want to come with her and her husband to get an update on the way Taylor are looking.  On the other hand there has been a slight drop in her total IgA and in her light chains. This means the carfilzomib may be working.  It makes sense to continue the carfilzomib and to add lenalidomide. She will start that tonight. She has tolerated her treatments poorly, but at this point we need to push "full steam ahead" if we are going to have any chance that all of controlling this process.  Accordingly she receives carfilzomib today and tomorrow. She will then be off next week. She will resume it the week after as planned. She continues on the dexamethasone 20 mg every Tuesday and Wednesday.  As for her shingles, luckily the symptoms are mild. She has not been able to tolerate antivirals well. She ready has acyclovir in hand and has been taking 400 mg twice daily. I suggested she try 800 mg 3 times a day and see if she can tolerate that  Vaanya is understandably very concerned about this new development. She knows to call for any problems that may develop before her next visit here.  Chauncey Cruel, MD   03/16/2015 1:53 PM

## 2015-03-16 NOTE — Telephone Encounter (Signed)
Appointments made and avs printed for patient °

## 2015-03-16 NOTE — Progress Notes (Signed)
Pt was seen in infusion area. INR=1.9 Pt has no now items to report Goal INR=1.8-2.5 Will continue current documented dose 5mg  on Monday and 2.5 mg other days of the week She will return with an already scheduled lab appmt on 4.12.16 Lab: 10:00 CC: 10:15

## 2015-03-16 NOTE — Patient Instructions (Signed)
[facility] Discharge Instructions for Patients Receiving Chemotherapy  Today you received the following chemotherapy agents Kyprolis.  To help prevent nausea and vomiting after your treatment, we encourage you to take your nausea medication as prescribed.   If you develop nausea and vomiting that is not controlled by your nausea medication, call the clinic.   BELOW ARE SYMPTOMS THAT SHOULD BE REPORTED IMMEDIATELY:  *FEVER GREATER THAN 100.5 F  *CHILLS WITH OR WITHOUT FEVER  NAUSEA AND VOMITING THAT IS NOT CONTROLLED WITH YOUR NAUSEA MEDICATION  *UNUSUAL SHORTNESS OF BREATH  *UNUSUAL BRUISING OR BLEEDING  TENDERNESS IN MOUTH AND THROAT WITH OR WITHOUT PRESENCE OF ULCERS  *URINARY PROBLEMS  *BOWEL PROBLEMS  UNUSUAL RASH Items with * indicate a potential emergency and should be followed up as soon as possible.  Feel free to call the clinic you have any questions or concerns. The clinic phone number is [phone].  Please show the CHEMO ALERT CARD at check-in to the Emergency Department and triage nurse.   

## 2015-03-16 NOTE — Patient Instructions (Signed)
PT will take 5mg  on Monday and 2.5 mg on other days of the week. She will come back to clinic on 4.12.16 at 10:00 for lab and 10:15 for CC

## 2015-03-17 ENCOUNTER — Ambulatory Visit (HOSPITAL_BASED_OUTPATIENT_CLINIC_OR_DEPARTMENT_OTHER): Payer: Medicare Other

## 2015-03-17 DIAGNOSIS — C9000 Multiple myeloma not having achieved remission: Secondary | ICD-10-CM

## 2015-03-17 DIAGNOSIS — Z5112 Encounter for antineoplastic immunotherapy: Secondary | ICD-10-CM | POA: Diagnosis not present

## 2015-03-17 DIAGNOSIS — C9002 Multiple myeloma in relapse: Secondary | ICD-10-CM

## 2015-03-17 MED ORDER — SODIUM CHLORIDE 0.9 % IV SOLN
Freq: Once | INTRAVENOUS | Status: AC
  Administered 2015-03-17: 13:00:00 via INTRAVENOUS

## 2015-03-17 MED ORDER — DEXTROSE 5 % IV SOLN
20.0000 mg/m2 | Freq: Once | INTRAVENOUS | Status: AC
  Administered 2015-03-17: 36 mg via INTRAVENOUS
  Filled 2015-03-17: qty 18

## 2015-03-17 MED ORDER — DEXAMETHASONE SODIUM PHOSPHATE 100 MG/10ML IJ SOLN
Freq: Once | INTRAMUSCULAR | Status: AC
  Administered 2015-03-17: 13:00:00 via INTRAVENOUS
  Filled 2015-03-17: qty 4

## 2015-03-17 NOTE — Patient Instructions (Signed)
[facility] Discharge Instructions for Patients Receiving Chemotherapy  Today you received the following chemotherapy agents kyprolis  To help prevent nausea and vomiting after your treatment, we encourage you to take your nausea medication as directed   If you develop nausea and vomiting that is not controlled by your nausea medication, call the clinic.   BELOW ARE SYMPTOMS THAT SHOULD BE REPORTED IMMEDIATELY:  *FEVER GREATER THAN 100.5 F  *CHILLS WITH OR WITHOUT FEVER  NAUSEA AND VOMITING THAT IS NOT CONTROLLED WITH YOUR NAUSEA MEDICATION  *UNUSUAL SHORTNESS OF BREATH  *UNUSUAL BRUISING OR BLEEDING  TENDERNESS IN MOUTH AND THROAT WITH OR WITHOUT PRESENCE OF ULCERS  *URINARY PROBLEMS  *BOWEL PROBLEMS  UNUSUAL RASH Items with * indicate a potential emergency and should be followed up as soon as possible.  Feel free to call the clinic you have any questions or concerns. The clinic phone number is [phone].  

## 2015-03-22 ENCOUNTER — Ambulatory Visit (HOSPITAL_BASED_OUTPATIENT_CLINIC_OR_DEPARTMENT_OTHER): Payer: Medicare Other | Admitting: Oncology

## 2015-03-22 ENCOUNTER — Telehealth: Payer: Self-pay | Admitting: Oncology

## 2015-03-22 ENCOUNTER — Other Ambulatory Visit (HOSPITAL_BASED_OUTPATIENT_CLINIC_OR_DEPARTMENT_OTHER): Payer: Medicare Other

## 2015-03-22 ENCOUNTER — Other Ambulatory Visit: Payer: Self-pay | Admitting: Oncology

## 2015-03-22 VITALS — BP 105/59 | HR 94 | Temp 97.6°F | Resp 18 | Ht 63.0 in | Wt 158.0 lb

## 2015-03-22 DIAGNOSIS — C9010 Plasma cell leukemia not having achieved remission: Secondary | ICD-10-CM

## 2015-03-22 DIAGNOSIS — C9002 Multiple myeloma in relapse: Secondary | ICD-10-CM

## 2015-03-22 DIAGNOSIS — Z86711 Personal history of pulmonary embolism: Secondary | ICD-10-CM

## 2015-03-22 DIAGNOSIS — N183 Chronic kidney disease, stage 3 unspecified: Secondary | ICD-10-CM

## 2015-03-22 DIAGNOSIS — E1122 Type 2 diabetes mellitus with diabetic chronic kidney disease: Secondary | ICD-10-CM

## 2015-03-22 DIAGNOSIS — S32009S Unspecified fracture of unspecified lumbar vertebra, sequela: Secondary | ICD-10-CM

## 2015-03-22 DIAGNOSIS — D689 Coagulation defect, unspecified: Secondary | ICD-10-CM

## 2015-03-22 LAB — COMPREHENSIVE METABOLIC PANEL (CC13)
ALBUMIN: 2.2 g/dL — AB (ref 3.5–5.0)
ALT: 17 U/L (ref 0–55)
AST: 26 U/L (ref 5–34)
Alkaline Phosphatase: 57 U/L (ref 40–150)
Anion Gap: 16 mEq/L — ABNORMAL HIGH (ref 3–11)
BUN: 39.7 mg/dL — ABNORMAL HIGH (ref 7.0–26.0)
CHLORIDE: 106 meq/L (ref 98–109)
CO2: 17 meq/L — AB (ref 22–29)
CREATININE: 1.6 mg/dL — AB (ref 0.6–1.1)
Calcium: 8.8 mg/dL (ref 8.4–10.4)
EGFR: 34 mL/min/{1.73_m2} — ABNORMAL LOW (ref >90)
Glucose: 222 mg/dl — ABNORMAL HIGH (ref 70–140)
Potassium: 4.8 mEq/L (ref 3.5–5.1)
Sodium: 139 mEq/L (ref 136–145)
TOTAL PROTEIN: 9 g/dL — AB (ref 6.4–8.3)
Total Bilirubin: 1.97 mg/dL — ABNORMAL HIGH (ref 0.20–1.20)

## 2015-03-22 LAB — CBC WITH DIFFERENTIAL/PLATELET
BASO%: 2.4 % — ABNORMAL HIGH (ref 0.0–2.0)
Basophils Absolute: 0.1 10*3/uL (ref 0.0–0.1)
EOS%: 1.9 % (ref 0.0–7.0)
Eosinophils Absolute: 0.1 10*3/uL (ref 0.0–0.5)
HCT: 28.3 % — ABNORMAL LOW (ref 34.8–46.6)
HEMOGLOBIN: 9.1 g/dL — AB (ref 11.6–15.9)
LYMPH%: 37.6 % (ref 14.0–49.7)
MCH: 27.1 pg (ref 25.1–34.0)
MCHC: 32.2 g/dL (ref 31.5–36.0)
MCV: 84.2 fL (ref 79.5–101.0)
MONO#: 0.6 10*3/uL (ref 0.1–0.9)
MONO%: 16.9 % — ABNORMAL HIGH (ref 0.0–14.0)
NEUT%: 41.2 % (ref 38.4–76.8)
NEUTROS ABS: 1.6 10*3/uL (ref 1.5–6.5)
NRBC: 10 % — AB (ref 0–0)
Platelets: 54 10*3/uL — ABNORMAL LOW (ref 145–400)
RBC: 3.36 10*6/uL — AB (ref 3.70–5.45)
RDW: 28.9 % — ABNORMAL HIGH (ref 11.2–14.5)
WBC: 3.8 10*3/uL — ABNORMAL LOW (ref 3.9–10.3)
lymph#: 1.4 10*3/uL (ref 0.9–3.3)

## 2015-03-22 LAB — PROTIME-INR
INR: 1.8 — AB (ref 2.00–3.50)
Protime: 21.6 Seconds — ABNORMAL HIGH (ref 10.6–13.4)

## 2015-03-22 LAB — TECHNOLOGIST REVIEW

## 2015-03-22 NOTE — Progress Notes (Signed)
[facility]  [phone] [phone]     ID: [name] OB: 07/31/1946  MR#: [id number]  PYP#:[id number]  PCP: Jenny Reichmann, MD GYN:   SU:  OTHER MD: Jodi Marble, MD;  Darlin Coco, MD  CHIEF COMPLAINT: Multiple myeloma, under active treatment CURRENT TREATMENT: dexamethasone, carfilzomib, lenalidomide, zolendronate  MYELOMA HISTORY: From the original consult note, dated 03/24/2014:  "The patient has a history of PE following arthroscopic surgery,as well as LLE DVT requiring Coumadin therapy since October of 2014. On 4/7 she presented to the ED with a 2 day history of hemoptysis and mucous material. INR was elevated at 6 requiring FFP and Vit K with improvement of coagulopathy (INR 1.4 this morning). Anticoagulation is on hold until hemoptysis resolves.CT angiogram on 4/7 was negative for pulmonary embolus. Bilateral pneumonia was suspected, requiring IV antibiotics.  Interestingly, diffusely heterogeneous appearance to the visualized bones were seen, more notable than on a prior CT in 2010. Bone scan on 4/8 showed old fractures in posterior 8-9 th ribs, and in a lumbar XR a new lumbar spinal fracture was noted, not picked up by the bone scan. Given the mottled appearance of the CT films,workup for multiple myeloma was initiated: She had anemia in the setting of chronic disease, iron deficiency and blood loss, and mild renal insufficiency was seen with a Cr 1.62. Sodium was normal. Her Calcium however was 12.3 on admission, today at greater than 15 receiving Calcitonin 100U nasal spray, Zometa 4 mg and hydration IV. SPEP / UPEP / IFE are pending. "  Her subsequent history is as detailed below.  INTERVAL HISTORY: [name] returns today for follow up of her multiple myeloma, accompanied by her husband [name]. She felt considerably better after receiving the 2 units of blood last week. She is ready to resume the carfilzomib today (this is day 8 cycle 1,  which had to be delayed a week because of the anemia problem).  REVIEW OF SYSTEMS: Sherae continues to have a very poor functional status, and just above the only activity she reports is making her bed. The rest of the day she is on the couch, not necessarily horizontal however. She has some nausea which she has treated successfully with the pills. She was feeling dizzy before the transfusion but that has resolved. She denies headaches or visual changes. She has had some pain in the right neck area, which is intermittent. She controls that with tramadol. She is not constipated from that medication. She has had severe itching in her right back which he wanted that look at today (this turned out to be shingles). She has had no fevers, and no bleeding that she is aware of. Her appetite is poor. Her breathing she thinks improved with the transfusion. Her blood sugars are "okay". A detailed review of systems today was otherwise stable  PAST MEDICAL HISTORY: Past Medical History  Diagnosis Date  . Neurofibromatosis   . HTN (hypertension)   . Obesity   . Hyperlipidemia     statin intolerant. LDL is 83  . UTI (lower urinary tract infection) 05/29/12  . Pulmonary embolism 09/17/2013  . STEMI (ST elevation myocardial infarction) 05/29/2012    Inferolateral STEMI ; s/p DES to RCA, nl EF  . Multiple myeloma     Dr. Jana Hakim   . DVT (deep venous thrombosis) 08/2014    RLE  . Type II diabetes mellitus   . History of blood transfusion ?2015    "blood count dropped real  low"  . Osteoarthritis, knee   . Arthritis     "shoulders hurt" (09/30/2014)    PAST SURGICAL HISTORY: Past Surgical History  Procedure Laterality Date  . Clavicle surgery Left ~ 1960  . Ankle fracture surgery Right ?1990's  . Total knee arthroplasty Left 09/15/2013    Procedure: TOTAL KNEE ARTHROPLASTY;  Surgeon: Vickey Huger, MD;  Location: [facility];  Service: Orthopedics;  Laterality: Left;  . Hernia repair    . Coronary angioplasty  with stent placement  05/2012    "1"  . Tonsillectomy and adenoidectomy  1950's  . Fracture surgery    . Bone marrow biopsy  03/2014    Dr. Jana Hakim   . Vaginal hysterectomy  1990's  . Left heart catheterization with coronary angiogram N/A 05/29/2012    Procedure: LEFT HEART CATHETERIZATION WITH CORONARY ANGIOGRAM;  Surgeon: Jettie Booze, MD;  Location: [facility] CATH LAB;  Service: Cardiovascular;  Laterality: N/A;    FAMILY HISTORY Family History  Problem Relation Age of Onset  . Heart disease Mother   . Arthritis Mother   . Stroke Mother   . Heart disease Son     SOCIAL HISTORY:    (reviewed 05/19/2014)  Lives in [place] with husband of 22 years, [name]. 2 sons in good health, one living within 2 blocks of the patient, one in [place]. 2 grandchildren     ADVANCED DIRECTIVES: in place   HEALTH MAINTENANCE: (updated 05/19/2014)  History  Substance Use Topics  . Smoking status: Never Smoker   . Smokeless tobacco: Never Used  . Alcohol Use: No     Colonoscopy:  not on file   PAP: not on file   Bone density: not on file   Lipid panel: December 2014   Allergies  Allergen Reactions  . Codeine Nausea And Vomiting    Sees things  . Hydrocodone Nausea And Vomiting    Sees things  . Niaspan [Niacin Er] Nausea Only  . Robaxin [Methocarbamol] Other (See Comments)    Made patient feel funny  . Statins Nausea And Vomiting and Other (See Comments)    Myalgias/leg pain with atorvastatin, rosuvastatin, lovastatin, simvastatin  . Tetracycline Other (See Comments)    Pt doesn't remember reaction  . Zetia [Ezetimibe] Nausea And Vomiting  . Zithromax [Azithromycin] Nausea And Vomiting    Current Outpatient Prescriptions  Medication Sig Dispense Refill  . acyclovir (ZOVIRAX) 400 MG tablet Take 1 tablet (400 mg total) by mouth daily. 30 tablet 3  . cetirizine (ZYRTEC) 10 MG tablet Take 10 mg by mouth daily as needed.     . clindamycin (CLINDAGEL) 1 % gel Apply topically 2  (two) times daily. 30 g 0  . dexamethasone (DECADRON) 4 MG tablet TAKE 5 TABLETS BY MOUTH WEEKLY ON TUESDAY AND WEDNESDAY ONLY 45 tablet 0  . glimepiride (AMARYL) 2 MG tablet Take 2 tablets by mouth every morning with breakfast.  On the days that you take prednisone or have chemotherapy take another dose at the evening meal 90 tablet 1  . glucose blood (ONE TOUCH ULTRA TEST) test strip 1 each by Other route 3 (three) times daily. 100 each 12  . guaiFENesin (MUCINEX) 600 MG 12 hr tablet Take by mouth 2 (two) times daily as needed.    . Lancets MISC Check glucose 3 times daily 100 each 6  . metoprolol tartrate (LOPRESSOR) 25 MG tablet Take 25 mg by mouth 2 (two) times daily.    . Multiple Vitamin (MULTIVITAMIN WITH MINERALS) TABS  tablet Take 1 tablet by mouth at bedtime. Centrum Silver    . Multiple Vitamins-Minerals (OCUVITE PO) Take 1 tablet by mouth daily.     Marland Kitchen NITROSTAT 0.4 MG SL tablet DISSOLVE 1 TABLET UNDER THE TONGUE EVERY 5 MINUTES AS NEEDED FOR CHEST PAIN. UP TO 3 DOSES 25 tablet 0  . prochlorperazine (COMPAZINE) 10 MG tablet Take 1 tablet (10 mg total) by mouth every 6 (six) hours as needed (Nausea or vomiting). 90 tablet 4  . promethazine (PHENERGAN) 50 MG tablet   0  . traMADol (ULTRAM) 50 MG tablet Take 1 tablet (50 mg total) by mouth every 6 (six) hours as needed for moderate pain. 30 tablet 2  . warfarin (COUMADIN) 5 MG tablet Take 1 tablet (5 mg total) by mouth daily. 60 tablet 4   No current facility-administered medications for this visit.    OBJECTIVE: Middle-aged white woman ambulating with walker Filed Vitals:   03/22/15 1451  BP: 105/59  Pulse: 94  Temp: 97.6 F (36.4 C)  Resp: 18     Body mass index is 28 kg/(m^2).    ECOG FS:2 - Symptomatic, <50% confined to bed Filed Weights   03/22/15 1451  Weight: 158 lb (71.668 kg)   Sclerae unicteric, EOMs intact Oropharynx cleart-- no thrush or other lesions No cervical or supraclavicular adenopathy Lungs no rales or  rhonchi Heart regular rate and rhythm Abd soft, nontender, positive bowel sounds MSK kyphosis and scoliosis but no focal spinal tenderness, Neuro: nonfocal, well oriented, depressed affect Breasts: Deferred Skin: In the mid lower right back there are some punctate lesions, no vesicles, strongly suggestive of shingles   LAB RESULTS: Results for KAMALEI, ROEDER (MRN [id number]) as of 03/16/2015 13:54  Ref. Range 10/16/2014 12:44 11/13/2014 12:47 12/22/2014 09:10 02/16/2015 13:48 03/08/2015 10:05  IgA Latest Range: 69-380 mg/dL 1790 (H) 1470 (H) 2650 (H) 5440 (H) 5080 (H)  Results for YASHVI, JASINSKI (MRN [id number]) as of 03/16/2015 13:54  Ref. Range 12/01/2014 12:42 12/22/2014 09:10 01/01/2015 15:31 01/26/2015 11:59 02/16/2015 13:48  M-SPIKE, % Latest Units: g/dL 0.36 0.36 0.35 0.45 4.47   Results for SHUNTEL, FISHBURN (MRN [id number]) as of 03/16/2015 13:54  Ref. Range 12/22/2014 09:10 01/01/2015 15:31 01/26/2015 11:59 02/16/2015 13:48 03/08/2015 10:05  Kappa:Lambda Ratio Latest Range: 0.26-1.65  NOT CALC 420.00 (H) NOT CALC 553.33 (H) 406.67 (H)    Lab Results  Component Value Date   WBC 3.8* 03/22/2015   NEUTROABS 1.6 03/22/2015   HGB 9.1* 03/22/2015   HCT 28.3* 03/22/2015   MCV 84.2 03/22/2015   PLT 54* 03/22/2015      Chemistry      Component Value Date/Time   NA 139 03/22/2015 1336   NA 140 11/03/2014 1326   K 4.8 03/22/2015 1336   K 4.7 11/03/2014 1326   CL 100 11/03/2014 1326   CO2 17* 03/22/2015 1336   CO2 25 11/03/2014 1326   BUN 39.7* 03/22/2015 1336   BUN 20 11/03/2014 1326   CREATININE 1.6* 03/22/2015 1336   CREATININE 0.89 11/03/2014 1326   CREATININE 0.81 12/09/2013 1000   GLU 173 11/08/2010      Component Value Date/Time   CALCIUM 8.8 03/22/2015 1336   CALCIUM 9.2 11/03/2014 1326   CALCIUM 11.1* 03/25/2014 0850   ALKPHOS 57 03/22/2015 1336   ALKPHOS 59 11/03/2014 1326   AST 26 03/22/2015 1336   AST 22 11/03/2014 1326   ALT 17 03/22/2015 1336   ALT 24 11/03/2014  1326   BILITOT  1.97* 03/22/2015 1336   BILITOT 0.6 11/03/2014 1326      STUDIES: No results found.   ASSESSMENT: 69 [place] presenting with hypercalcemia, anemia and mottled bone lesions April 2015, M-spike 3.82 g, IFE showing IgA/kappa myeloma with bone marrow biopsy 03/30/2014 with 88% plasma cells, FISH  t(4,14), deletion 13 and deletion 11; initial beta-2 microglobulin 16.  (1) considered E1A11 study, but unable to tolerate anticoagulation  (2) associated issues:  (a) coagulopathy: history of post-op PE October 2014; history of bleeding with warfarin; epistaxis with  ASA 325 ms/ day; switched to 81 mg/ day with fair tolerance, then fully coumadinized until 01/05/2015  (c) hypercalcemia: started zolendronate 03/26/2014, to be repeated every 12 weeks  (d) pain: compression fracture L1, rib fractures: on tylenol and tramadol  (e) immunocompromise: on Septra prophylaxis, valacyclovir and acyclovir both stopped because of uncontrolled diarrhea  (3) started dexamethasone 40 mg/ week (20 mg po on Tues and Weds April 4098); complicated by hyperglycemia with history of diabetes, followed by Dr. Everlene Farrier  (4) started bortezomib SQ 04/28/2014, to be given days 1,4, 8 and 11 of ech 21 day cycle; stopped February 2016 because of diarrhea  (5) started lenalidomide first week in September 2015, held after 1 cycle with poor tolerance; resumed 09/30/2014 at 10 mg daily, days 1 through 21 of each 28 day cycle; stopped 01/02/2015, with questionable evidence of response  (6) cyclophosphamide started 01/05/2015 at fixed dose (approximately 300 mg/m IV), repeated weekly until March 2016, when it was discontinued secondary to progression   (7) anemia of chronic disease/anemia secondary to chemotherapy:  darbepoetin considered but omittedsecondary to clotting risk with resumption of immune modulators  (8) starting carfilzomib 03/02/2015, with continuing dexamethasone weekly; to start  lenalidomide once INR in the therapeutic range (target date 03/15/2015)  (a) coumadin started 02/23/2015  (b) acyclovir started 02/23/2015  (c) lenalidomide started 03/16/2015  PLAN: I met with Hassan Rowan and her family today for approximately one hour. We discussed the nature and course of her disease. In most cases of myeloma we are able to obtain a remission after a few months of treatment and then the patient used to goes on maintenance or observation. We never got that far in her case. The tumor did not respond well, with stable disease being mostly what we got, and she had significant problems from the treatments themselves.  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 In my experience this is a terminal development. He would not be inappropriate to stop the treatment and place a hospice referral and that was one of the options that we discussed another option was to seek a second opinion at one of the tertiary Medical Center's near here. Third option would be to continue the current treatment, since it is a combination she has not had and she appears to be tolerating it moderately well.  Ernestene thought hospice meant the hospice home. We clarified that hospice is primarily a service that helps the caregivers care for the patient at home. I think hospice would be very useful to her husband and children even if we do continue treatment. At this point though Lizania would like to wait "a couple of weeks" before calling hospice in. What she would like to do is continue the treatment we are doing so long as she is tolerating it well. She has only had 1 cycle of the current combination so that is reasonable. She has no interest in  seeking a second opinion.  I did tell Machele that I think 2 or 3 weeks may be the time that she has. She was very brave, feels she is "saved", and said "everyone is going to have to go through this at one time or another and no one  knows when."  I did tell Blondine that her kidneys are slowly turning off and that her bone marrow function is also decreasing, so that her platelet count for example is falling. For that reason I am stopping her Coumadin. She will go back on an aspirin, 81 mg daily. She is also more anemic and her white count is dropping. This increases her risk of infection. Unfortunately we are going to have to stop the acyclovir because it is causing her diarrhea. She continues on her medications for diabetes, but she understands that she stops eating and continues to take these she would be at risk for severe hypoglycemia. She will monitor her blood sugar. I encouraged her to drink between 2 and 3 quarts of liquid a day, which hopefully will preserve her kidney function  Sadhana has a living will in place. Her husband is her healthcare power of attorney. She does not want resuscitation in case of a terminal event.  Her next treatment is scheduled for 411. She will have lab work and I will see her again that day.      Chauncey Cruel, MD   03/22/2015 3:26 PM

## 2015-03-22 NOTE — Telephone Encounter (Signed)
Appointments made and avs printed for patient °

## 2015-03-23 ENCOUNTER — Ambulatory Visit: Payer: Medicare Other

## 2015-03-23 ENCOUNTER — Other Ambulatory Visit: Payer: Medicare Other

## 2015-03-24 ENCOUNTER — Telehealth: Payer: Self-pay | Admitting: *Deleted

## 2015-03-24 ENCOUNTER — Other Ambulatory Visit: Payer: Self-pay | Admitting: *Deleted

## 2015-03-24 ENCOUNTER — Other Ambulatory Visit (HOSPITAL_BASED_OUTPATIENT_CLINIC_OR_DEPARTMENT_OTHER): Payer: Medicare Other

## 2015-03-24 DIAGNOSIS — C9002 Multiple myeloma in relapse: Secondary | ICD-10-CM

## 2015-03-24 DIAGNOSIS — R300 Dysuria: Secondary | ICD-10-CM

## 2015-03-24 LAB — URINALYSIS, MICROSCOPIC - CHCC
Bilirubin (Urine): NEGATIVE
Glucose: NEGATIVE mg/dL
KETONES: NEGATIVE mg/dL
NITRITE: POSITIVE
Protein: 30 mg/dL
Specific Gravity, Urine: 1.015 (ref 1.003–1.035)
Urobilinogen, UR: 0.2 mg/dL (ref 0.2–1)
pH: 5 (ref 4.6–8.0)

## 2015-03-24 MED ORDER — NITROFURANTOIN MONOHYD MACRO 100 MG PO CAPS: 100.0000 mg | ORAL_CAPSULE | Freq: Every day | ORAL | Status: AC

## 2015-03-24 NOTE — Telephone Encounter (Signed)
This RN contacted pt and informed her per MD review of U/A - need for antibiotic. Prescription being sent to pt's pharmacy- as well as per discussion with request to obtain AZO standard for symptom management.

## 2015-03-24 NOTE — Telephone Encounter (Signed)
Patient called reporting pain.  "When I go to the bathroom to urinate it hurts.  Feels like the bottom of my stomach is going to fall out.  I am forcing fluids for the past two days but no relief.  Have not taken anything for pain.  Urine is darker yellow in color." This nurse asked her to come in for UA/CS.  "I can't come in, I hurt to bad.  Can my spouse pick up a cup?"  Notified Medical technologist and P.O.F generated.  Spouse to pick up urine collection kit within the next twenty minutes and return cup this morning for testing as patient unable to come in today.

## 2015-03-25 ENCOUNTER — Telehealth: Payer: Self-pay | Admitting: *Deleted

## 2015-03-25 MED ORDER — TRAMADOL HCL 50 MG PO TABS: 100.0000 mg | ORAL_TABLET | Freq: Four times a day (QID) | ORAL | Status: AC | PRN

## 2015-03-25 MED ORDER — ALPRAZOLAM ER 1 MG PO TB24: 1.0000 mg | ORAL_TABLET | Freq: Every day | ORAL | Status: AC

## 2015-03-25 NOTE — Telephone Encounter (Signed)
This RN spoke with pt per call from family member stating concerns - pt was leaving their home and was unable to call.  Concern was noted for ongoing pain, SOB, and anxiety.  This RN informed family member pt would be contacted and verified what time they feel pt would be home.  This RN called pt post 20 minutes and discussed above concerns.  [name] states she pain seems to be associated with the bladder infection.  She obtains some relief with the tramadol and AZO standard " but the pain returns before time to take the medication again "  Pt is SOB but is able to manage with use of a wheelchair.  Discussed anxiety and how it can be related to pain and SOB.  This RN informed pt above issues will be reviewed with MD for recommendations.  Per MD review recommended for pt to increase the tramadol to 2 tablets q 6 hours. Prescription sent to pharmacy for xanax xr for anxiety.  This RN called and informed pt of above.

## 2015-03-25 NOTE — Telephone Encounter (Signed)
PT.'S BLOOD PRESSURE IS 90/60. SHE IS SHORT OF BREATH. PT.IS HAVING SHARP PAIN AND DIARRHEA. VAL DODD,RN WILL CALL ABOUT PT.

## 2015-03-26 LAB — URINE CULTURE

## 2015-03-29 ENCOUNTER — Ambulatory Visit: Payer: Medicare Other

## 2015-03-29 ENCOUNTER — Ambulatory Visit (HOSPITAL_BASED_OUTPATIENT_CLINIC_OR_DEPARTMENT_OTHER): Payer: Medicare Other | Admitting: Oncology

## 2015-03-29 ENCOUNTER — Other Ambulatory Visit (HOSPITAL_BASED_OUTPATIENT_CLINIC_OR_DEPARTMENT_OTHER): Payer: Medicare Other

## 2015-03-29 ENCOUNTER — Telehealth: Payer: Self-pay | Admitting: *Deleted

## 2015-03-29 DIAGNOSIS — D6481 Anemia due to antineoplastic chemotherapy: Secondary | ICD-10-CM | POA: Diagnosis not present

## 2015-03-29 DIAGNOSIS — C9000 Multiple myeloma not having achieved remission: Secondary | ICD-10-CM | POA: Diagnosis present

## 2015-03-29 DIAGNOSIS — D689 Coagulation defect, unspecified: Secondary | ICD-10-CM

## 2015-03-29 DIAGNOSIS — C9002 Multiple myeloma in relapse: Secondary | ICD-10-CM

## 2015-03-29 LAB — CBC WITH DIFFERENTIAL/PLATELET
BASO%: 0.7 % (ref 0.0–2.0)
Basophils Absolute: 0 10*3/uL (ref 0.0–0.1)
EOS ABS: 0.1 10*3/uL (ref 0.0–0.5)
EOS%: 1.7 % (ref 0.0–7.0)
HCT: 27.7 % — ABNORMAL LOW (ref 34.8–46.6)
HGB: 8.8 g/dL — ABNORMAL LOW (ref 11.6–15.9)
LYMPH%: 45.8 % (ref 14.0–49.7)
MCH: 27.8 pg (ref 25.1–34.0)
MCHC: 31.8 g/dL (ref 31.5–36.0)
MCV: 87.4 fL (ref 79.5–101.0)
MONO#: 0.6 10*3/uL (ref 0.1–0.9)
MONO%: 20 % — ABNORMAL HIGH (ref 0.0–14.0)
NEUT%: 31.8 % — AB (ref 38.4–76.8)
NEUTROS ABS: 0.9 10*3/uL — AB (ref 1.5–6.5)
PLATELETS: 95 10*3/uL — AB (ref 145–400)
RBC: 3.17 10*6/uL — AB (ref 3.70–5.45)
RDW: 29 % — ABNORMAL HIGH (ref 11.2–14.5)
WBC: 3 10*3/uL — ABNORMAL LOW (ref 3.9–10.3)
lymph#: 1.4 10*3/uL (ref 0.9–3.3)
nRBC: 3 % — ABNORMAL HIGH (ref 0–0)

## 2015-03-29 LAB — COMPREHENSIVE METABOLIC PANEL (CC13)
ALT: 83 U/L — AB (ref 0–55)
ANION GAP: 11 meq/L (ref 3–11)
AST: 50 U/L — AB (ref 5–34)
Albumin: 2.5 g/dL — ABNORMAL LOW (ref 3.5–5.0)
Alkaline Phosphatase: 57 U/L (ref 40–150)
BILIRUBIN TOTAL: 1.06 mg/dL (ref 0.20–1.20)
BUN: 36.6 mg/dL — ABNORMAL HIGH (ref 7.0–26.0)
CHLORIDE: 110 meq/L — AB (ref 98–109)
CO2: 18 meq/L — AB (ref 22–29)
CREATININE: 1.3 mg/dL — AB (ref 0.6–1.1)
Calcium: 7.8 mg/dL — ABNORMAL LOW (ref 8.4–10.4)
EGFR: 43 mL/min/{1.73_m2} — ABNORMAL LOW (ref >90)
Glucose: 139 mg/dl (ref 70–140)
Potassium: 3.9 mEq/L (ref 3.5–5.1)
Sodium: 139 mEq/L (ref 136–145)
TOTAL PROTEIN: 8.2 g/dL (ref 6.4–8.3)

## 2015-03-29 LAB — TECHNOLOGIST REVIEW

## 2015-03-29 NOTE — Patient Instructions (Signed)
[facility] Discharge Instructions for Patients Receiving Chemotherapy  Today you received the following chemotherapy agents kyprolis  To help prevent nausea and vomiting after your treatment, we encourage you to take your nausea medication as directed   If you develop nausea and vomiting that is not controlled by your nausea medication, call the clinic.   BELOW ARE SYMPTOMS THAT SHOULD BE REPORTED IMMEDIATELY:  *FEVER GREATER THAN 100.5 F  *CHILLS WITH OR WITHOUT FEVER  NAUSEA AND VOMITING THAT IS NOT CONTROLLED WITH YOUR NAUSEA MEDICATION  *UNUSUAL SHORTNESS OF BREATH  *UNUSUAL BRUISING OR BLEEDING  TENDERNESS IN MOUTH AND THROAT WITH OR WITHOUT PRESENCE OF ULCERS  *URINARY PROBLEMS  *BOWEL PROBLEMS  UNUSUAL RASH Items with * indicate a potential emergency and should be followed up as soon as possible.  Feel free to call the clinic you have any questions or concerns. The clinic phone number is [phone].  

## 2015-03-29 NOTE — Telephone Encounter (Signed)
This RN placed referral per MD review and discussion with pt for [facility]

## 2015-03-29 NOTE — Progress Notes (Signed)
Hold chemo today per Dr. Jana Hakim.  Dr. Jana Hakim to infusion room to assess pt.  Pt is requesting hospice services at this time.

## 2015-03-29 NOTE — Progress Notes (Signed)
[facility]  [phone] [phone]     ID: [name] OB: July 09, 1946  MR#: [id number]  CMK#:[id number]  PCP: Jenny Reichmann, MD GYN:   SU:  OTHER MD: Jodi Marble, MD;  Darlin Coco, MD  CHIEF COMPLAINT: Multiple myeloma, under active treatment CURRENT TREATMENT: Supportive/palliative care  MYELOMA HISTORY: From the original consult note, dated 03/24/2014:  "The patient has a history of PE following arthroscopic surgery,as well as LLE DVT requiring Coumadin therapy since October of 2014. On 4/7 she presented to the ED with a 2 day history of hemoptysis and mucous material. INR was elevated at 6 requiring FFP and Vit K with improvement of coagulopathy (INR 1.4 this morning). Anticoagulation is on hold until hemoptysis resolves.CT angiogram on 4/7 was negative for pulmonary embolus. Bilateral pneumonia was suspected, requiring IV antibiotics.  Interestingly, diffusely heterogeneous appearance to the visualized bones were seen, more notable than on a prior CT in 2010. Bone scan on 4/8 showed old fractures in posterior 8-9 th ribs, and in a lumbar XR a new lumbar spinal fracture was noted, not picked up by the bone scan. Given the mottled appearance of the CT films,workup for multiple myeloma was initiated: She had anemia in the setting of chronic disease, iron deficiency and blood loss, and mild renal insufficiency was seen with a Cr 1.62. Sodium was normal. Her Calcium however was 12.3 on admission, today at greater than 15 receiving Calcitonin 100U nasal spray, Zometa 4 mg and hydration IV. SPEP / UPEP / IFE are pending. "  Her subsequent history is as detailed below.  INTERVAL HISTORY: [name] returns today for follow up of her multiple myeloma, accompanied by her husband [name]. She was scheduled to receive carfilzomib today, but her counts turn out to be low and instead we had a discussion regarding further management   REVIEW OF  SYSTEMS: [name] has felt very tired. She has some discomfort in the lower abdomen. This is not constant. She was having some diarrhea yesterday but it seems to be better today. She denies headaches, visual changes, nausea, or vomiting. She denies cough or phlegm production. The main symptoms she is experiencing is fatigued. She still has some taste alteration. There is no significant peripheral neuropathy symptoms. She is working hard to keep herself well-hydrated. A detailed review of systems today was otherwise stable  PAST MEDICAL HISTORY: Past Medical History  Diagnosis Date  . Neurofibromatosis   . HTN (hypertension)   . Obesity   . Hyperlipidemia     statin intolerant. LDL is 83  . UTI (lower urinary tract infection) 05/29/12  . Pulmonary embolism 09/17/2013  . STEMI (ST elevation myocardial infarction) 05/29/2012    Inferolateral STEMI ; s/p DES to RCA, nl EF  . Multiple myeloma     Dr. Jana Hakim   . DVT (deep venous thrombosis) 08/2014    RLE  . Type II diabetes mellitus   . History of blood transfusion ?2015    "blood count dropped real low"  . Osteoarthritis, knee   . Arthritis     "shoulders hurt" (09/30/2014)    PAST SURGICAL HISTORY: Past Surgical History  Procedure Laterality Date  . Clavicle surgery Left ~ 1960  . Ankle fracture surgery Right ?1990's  . Total knee arthroplasty Left 09/15/2013    Procedure: TOTAL KNEE ARTHROPLASTY;  Surgeon: Vickey Huger, MD;  Location: [facility];  Service: Orthopedics;  Laterality: Left;  . Hernia repair    . Coronary angioplasty with stent  placement  05/2012    "1"  . Tonsillectomy and adenoidectomy  1950's  . Fracture surgery    . Bone marrow biopsy  03/2014    Dr. Jana Hakim   . Vaginal hysterectomy  1990's  . Left heart catheterization with coronary angiogram N/A 05/29/2012    Procedure: LEFT HEART CATHETERIZATION WITH CORONARY ANGIOGRAM;  Surgeon: Jettie Booze, MD;  Location: [facility] CATH LAB;  Service: Cardiovascular;  Laterality:  N/A;    FAMILY HISTORY Family History  Problem Relation Age of Onset  . Heart disease Mother   . Arthritis Mother   . Stroke Mother   . Heart disease Son     SOCIAL HISTORY:    (reviewed 05/19/2014)  Lives in [place] with husband of 66 years, [name]. 2 sons in good health, one living within 2 blocks of the patient, one in [place]. 2 grandchildren     ADVANCED DIRECTIVES: in place   HEALTH MAINTENANCE: (updated 05/19/2014)  History  Substance Use Topics  . Smoking status: Never Smoker   . Smokeless tobacco: Never Used  . Alcohol Use: No     Colonoscopy:  not on file   PAP: not on file   Bone density: not on file   Lipid panel: December 2014   Allergies  Allergen Reactions  . Codeine Nausea And Vomiting    Sees things  . Hydrocodone Nausea And Vomiting    Sees things  . Niaspan [Niacin Er] Nausea Only  . Robaxin [Methocarbamol] Other (See Comments)    Made patient feel funny  . Statins Nausea And Vomiting and Other (See Comments)    Myalgias/leg pain with atorvastatin, rosuvastatin, lovastatin, simvastatin  . Tetracycline Other (See Comments)    Pt doesn't remember reaction  . Zetia [Ezetimibe] Nausea And Vomiting  . Zithromax [Azithromycin] Nausea And Vomiting    Current Outpatient Prescriptions  Medication Sig Dispense Refill  . acyclovir (ZOVIRAX) 400 MG tablet Take 1 tablet (400 mg total) by mouth daily. 30 tablet 3  . ALPRAZolam (XANAX XR) 1 MG 24 hr tablet Take 1 tablet (1 mg total) by mouth daily. 30 tablet 2  . cetirizine (ZYRTEC) 10 MG tablet Take 10 mg by mouth daily as needed.     . clindamycin (CLINDAGEL) 1 % gel Apply topically 2 (two) times daily. 30 g 0  . dexamethasone (DECADRON) 4 MG tablet TAKE 5 TABLETS BY MOUTH WEEKLY ON TUESDAY AND WEDNESDAY ONLY 45 tablet 0  . glimepiride (AMARYL) 2 MG tablet Take 2 tablets by mouth every morning with breakfast.  On the days that you take prednisone or have chemotherapy take another dose at the  evening meal 90 tablet 1  . glucose blood (ONE TOUCH ULTRA TEST) test strip 1 each by Other route 3 (three) times daily. 100 each 12  . guaiFENesin (MUCINEX) 600 MG 12 hr tablet Take by mouth 2 (two) times daily as needed.    . Lancets MISC Check glucose 3 times daily 100 each 6  . metoprolol tartrate (LOPRESSOR) 25 MG tablet Take 25 mg by mouth 2 (two) times daily.    . Multiple Vitamin (MULTIVITAMIN WITH MINERALS) TABS tablet Take 1 tablet by mouth at bedtime. Centrum Silver    . Multiple Vitamins-Minerals (OCUVITE PO) Take 1 tablet by mouth daily.     . nitrofurantoin, macrocrystal-monohydrate, (MACROBID) 100 MG capsule Take 1 capsule (100 mg total) by mouth at bedtime. 30 capsule 1  . NITROSTAT 0.4 MG SL tablet DISSOLVE 1 TABLET UNDER THE TONGUE EVERY  5 MINUTES AS NEEDED FOR CHEST PAIN. UP TO 3 DOSES 25 tablet 0  . prochlorperazine (COMPAZINE) 10 MG tablet Take 1 tablet (10 mg total) by mouth every 6 (six) hours as needed (Nausea or vomiting). 90 tablet 4  . promethazine (PHENERGAN) 50 MG tablet   0  . traMADol (ULTRAM) 50 MG tablet Take 2 tablets (100 mg total) by mouth every 6 (six) hours as needed for moderate pain. 60 tablet 2  . warfarin (COUMADIN) 5 MG tablet Take 1 tablet (5 mg total) by mouth daily. 60 tablet 4   No current facility-administered medications for this visit.    OBJECTIVE: Middle-aged white woman evaluated in the treatment chair There were no vitals filed for this visit.   There is no weight on file to calculate BMI.    Vitals - 1 value per visit 03/29/8785  SYSTOLIC 767  DIASTOLIC 51  Pulse 69  Temperature 97.8  Respirations 18  Weight (lb)   Height   BMI   VISIT REPORT      ECOG FS:2 - Symptomatic, <50% confined to bed   Sclerae unicteric, pupils round and equal Oropharynx clear and moist No cervical or supraclavicular adenopathy Lungs no rales or rhonchi Heart regular rate and rhythm Abd soft, nontender, positive bowel sounds Neuro: nonfocal, well  oriented, depressed affect  LAB RESULTS: Results for LAURELLE, SKIVER (MRN [id number]) as of 03/16/2015 13:54  Ref. Range 10/16/2014 12:44 11/13/2014 12:47 12/22/2014 09:10 02/16/2015 13:48 03/08/2015 10:05  IgA Latest Range: 69-380 mg/dL 1790 (H) 1470 (H) 2650 (H) 5440 (H) 5080 (H)  Results for MISBAH, HORNADAY (MRN [id number]) as of 03/16/2015 13:54  Ref. Range 12/01/2014 12:42 12/22/2014 09:10 01/01/2015 15:31 01/26/2015 11:59 02/16/2015 13:48  M-SPIKE, % Latest Units: g/dL 0.36 0.36 0.35 0.45 4.47   Results for GARLENE, APPERSON (MRN [id number]) as of 03/16/2015 13:54  Ref. Range 12/22/2014 09:10 01/01/2015 15:31 01/26/2015 11:59 02/16/2015 13:48 03/08/2015 10:05  Kappa:Lambda Ratio Latest Range: 0.26-1.65  NOT CALC 420.00 (H) NOT CALC 553.33 (H) 406.67 (H)    Lab Results  Component Value Date   WBC 3.0* 03/29/2015   NEUTROABS 0.9* 03/29/2015   HGB 8.8* 03/29/2015   HCT 27.7* 03/29/2015   MCV 87.4 03/29/2015   PLT 95* 03/29/2015      Chemistry      Component Value Date/Time   NA 139 03/29/2015 1414   NA 140 11/03/2014 1326   K 3.9 03/29/2015 1414   K 4.7 11/03/2014 1326   CL 100 11/03/2014 1326   CO2 18* 03/29/2015 1414   CO2 25 11/03/2014 1326   BUN 36.6* 03/29/2015 1414   BUN 20 11/03/2014 1326   CREATININE 1.3* 03/29/2015 1414   CREATININE 0.89 11/03/2014 1326   CREATININE 0.81 12/09/2013 1000   GLU 173 11/08/2010      Component Value Date/Time   CALCIUM 7.8* 03/29/2015 1414   CALCIUM 9.2 11/03/2014 1326   CALCIUM 11.1* 03/25/2014 0850   ALKPHOS 57 03/29/2015 1414   ALKPHOS 59 11/03/2014 1326   AST 50* 03/29/2015 1414   AST 22 11/03/2014 1326   ALT 83* 03/29/2015 1414   ALT 24 11/03/2014 1326   BILITOT 1.06 03/29/2015 1414   BILITOT 0.6 11/03/2014 1326      STUDIES: No results found.   ASSESSMENT: 69 [place] presenting with hypercalcemia, anemia and mottled bone lesions April 2015, M-spike 3.82 g, IFE showing IgA/kappa myeloma with bone marrow biopsy  03/30/2014 with 88% plasma cells, FISH  t(4,14), deletion  13 and deletion 11; initial beta-2 microglobulin 16.  (1) considered E1A11 study, but unable to tolerate anticoagulation  (2) associated issues:  (a) coagulopathy: history of post-op PE October 2014; history of bleeding with warfarin; epistaxis with  ASA 325 ms/ day; switched to 81 mg/ day with fair tolerance, then fully coumadinized until 01/05/2015  (c) hypercalcemia: started zolendronate 03/26/2014, to be repeated every 12 weeks  (d) pain: compression fracture L1, rib fractures: on tylenol and tramadol  (e) immunocompromise: on Septra prophylaxis, valacyclovir and acyclovir both stopped because of uncontrolled diarrhea  (3) started dexamethasone 40 mg/ week (20 mg po on Tues and Weds April 3762); complicated by hyperglycemia with history of diabetes, followed by Dr. Everlene Farrier  (4) started bortezomib SQ 04/28/2014, to be given days 1,4, 8 and 11 of ech 21 day cycle; stopped February 2016 because of diarrhea  (5) started lenalidomide first week in September 2015, held after 1 cycle with poor tolerance; resumed 09/30/2014 at 10 mg daily, days 1 through 21 of each 28 day cycle; stopped 01/02/2015, with questionable evidence of response  (6) cyclophosphamide started 01/05/2015 at fixed dose (approximately 300 mg/m IV), repeated weekly until March 2016, when it was discontinued secondary to progression   (7) anemia of chronic disease/anemia secondary to chemotherapy:  darbepoetin considered but omittedsecondary to clotting risk with resumption of immune modulators  (8) starting carfilzomib 03/02/2015, with continuing dexamethasone weekly; to start lenalidomide once INR in the therapeutic range (target date 03/15/2015)  (a) coumadin started 02/23/2015  (b) acyclovir started 02/23/2015  (c) lenalidomide started 03/16/2015  PLAN: Omolara came in today to receive a further dose of carfilzomib. However her [place] was under thousand. We were going to  postpone treatment and give her some Neupogen.  However she asked me if I really thought the treatment would be helpful. My understands her was that I really did not think it was likely to help. She was called by the daughter were former friend and they discussed hospice again and she understood that hospice can be done at home even if the patient is moribund Sharrie Rothman is a family is agreeable and the patient is safe and comfortable at home. She was reassured that she would make those decisions and if she is unable to make decisions her husband would make those decisions. No one can treat her against her will, or draw labs against her will, or move her out of her home against her will.  After that discussion she felt much more comfortable regarding hospice and requested a hospice consult. She decided against further active treatment. We have called and place that request today.  Accordingly I have canceled the carfilzomib. She has some lenalidomide pills left in she may as well take those in the evening. I also think it may be a good idea to continue the dexamethasone, to prevent adrenal insufficiency problems. The more reasonable dose then 20 mg daily for 2 days a week would be 4 mg daily, but we did not make that change today.  Jaida has a living will in place. Her husband is her healthcare power of attorney. She does not want resuscitation in case of a terminal event. She will see me again in about 3 weeks to make sure everything is in order, and from that point I will start seeing her in an every 6 week basis.   Chauncey Cruel, MD   03/29/2015 6:48 PM

## 2015-03-30 ENCOUNTER — Telehealth: Payer: Self-pay | Admitting: *Deleted

## 2015-03-30 ENCOUNTER — Other Ambulatory Visit: Payer: Medicare Other

## 2015-03-30 ENCOUNTER — Ambulatory Visit: Payer: Medicare Other

## 2015-03-30 ENCOUNTER — Other Ambulatory Visit: Payer: Self-pay | Admitting: *Deleted

## 2015-03-30 ENCOUNTER — Telehealth: Payer: Self-pay | Admitting: Oncology

## 2015-03-30 NOTE — Telephone Encounter (Signed)
Appointments cancelled per pof,follow up made and a calendar was mailed to the patient

## 2015-03-30 NOTE — Telephone Encounter (Signed)
TCF Dwan Bolt, RN from [facility].  She has received referral on pt and will see pt. this morning. Sula Soda is requesting last office note from Dr. Jana Hakim from 03/29/15. This was fax'd to [facility] referral center.

## 2015-03-31 ENCOUNTER — Telehealth: Payer: Self-pay | Admitting: *Deleted

## 2015-03-31 NOTE — Telephone Encounter (Signed)
Sharyn Lull called reporting patient's blood sugar = 343.  She is on steroids and we've noted blood sugar is higher when she takes steroids.  Takes Glimepiride 2 mg daily and when blood sugar is elevated, she takes 1/2 tab prn for blood sugar > 200.  Checks blood sugar before meals.  Will check within the hour and I will call her to see if the Glimepiride/Amyryl has helped.  Does Dr. Jana Hakim have any orders or this is just an FYI." Clarified dose as EPIC reads different sig for this drug.  Patient reports  Dose was reduced for glimepride from 4 mg daily to 2 mg and she takes 1/2 tab for blood sugar > 200 mg.   Will notify provider.

## 2015-04-01 NOTE — Telephone Encounter (Signed)
Please ask the pt to change her steroid schedule from 20 mg/d x 2 days to 2 mg (1/2 tab) daily at breakfast every day-- and call us if she feels very fatigued on this dose  Thanks!

## 2015-04-02 NOTE — Telephone Encounter (Signed)
Called Ms. [name] to give her these instructions.  She asked if this is everyday and I confirmed everyday to take 2 mg or 1/2 pill with food.  Reports her blood sugar is 125 or 130 unless it's the day she takes the steroids.

## 2015-04-05 ENCOUNTER — Ambulatory Visit: Payer: Medicare Other

## 2015-04-05 ENCOUNTER — Encounter: Payer: Self-pay | Admitting: Oncology

## 2015-04-05 ENCOUNTER — Other Ambulatory Visit: Payer: Self-pay | Admitting: *Deleted

## 2015-04-05 ENCOUNTER — Other Ambulatory Visit: Payer: Medicare Other

## 2015-04-05 NOTE — Progress Notes (Signed)
I will place fmla forms on the desk of nurse for the dr. Jana Hakim for sherrie and Roderic Palau

## 2015-04-06 ENCOUNTER — Ambulatory Visit: Payer: Medicare Other

## 2015-04-06 ENCOUNTER — Other Ambulatory Visit: Payer: Self-pay | Admitting: Cardiology

## 2015-04-06 ENCOUNTER — Telehealth: Payer: Self-pay

## 2015-04-06 ENCOUNTER — Other Ambulatory Visit: Payer: Self-pay | Admitting: *Deleted

## 2015-04-06 NOTE — Telephone Encounter (Signed)
No entry 

## 2015-04-06 NOTE — Telephone Encounter (Signed)
[facility]' Medicare Dept faxed req for all info from last 6 mos pertaining to pt's DM. I faxed OV notes from 01/21/15 and 10/19/14, as well as A1C results and Rx for supplies as req'd. These are all of the pertaining notes we have.

## 2015-04-07 ENCOUNTER — Encounter: Payer: Self-pay | Admitting: Oncology

## 2015-04-07 NOTE — Progress Notes (Signed)
I called and left [name]/[name] a message fmla forms were ready and faxed for both of them. [name] had requested copies be mailed to them for their records.

## 2015-04-08 ENCOUNTER — Other Ambulatory Visit: Payer: Self-pay | Admitting: *Deleted

## 2015-04-09 ENCOUNTER — Encounter: Payer: Self-pay | Admitting: Oncology

## 2015-04-09 NOTE — Progress Notes (Signed)
I faxed fmla form for [name] to  [employer] [phone]

## 2015-04-12 ENCOUNTER — Other Ambulatory Visit: Payer: Self-pay | Admitting: *Deleted

## 2015-04-13 ENCOUNTER — Other Ambulatory Visit: Payer: Self-pay | Admitting: Oncology

## 2015-04-16 ENCOUNTER — Telehealth: Payer: Self-pay | Admitting: Oncology

## 2015-04-16 NOTE — Telephone Encounter (Signed)
pt called to cx and did not want to r/s

## 2015-04-19 ENCOUNTER — Ambulatory Visit: Payer: Medicare Other | Admitting: Oncology

## 2015-04-20 ENCOUNTER — Other Ambulatory Visit: Payer: Self-pay | Admitting: *Deleted

## 2015-04-21 ENCOUNTER — Ambulatory Visit: Payer: Medicare Other | Admitting: Adult Health

## 2015-04-22 ENCOUNTER — Other Ambulatory Visit: Payer: Self-pay | Admitting: *Deleted

## 2015-04-27 ENCOUNTER — Other Ambulatory Visit: Payer: Self-pay | Admitting: *Deleted

## 2015-05-03 ENCOUNTER — Other Ambulatory Visit: Payer: Self-pay | Admitting: *Deleted

## 2015-05-06 ENCOUNTER — Telehealth: Payer: Self-pay | Admitting: *Deleted

## 2015-05-06 ENCOUNTER — Ambulatory Visit: Payer: Medicare Other | Admitting: Emergency Medicine

## 2015-05-06 ENCOUNTER — Other Ambulatory Visit: Payer: Self-pay | Admitting: Nurse Practitioner

## 2015-05-06 ENCOUNTER — Other Ambulatory Visit: Payer: Self-pay | Admitting: Oncology

## 2015-05-06 NOTE — Telephone Encounter (Signed)
PT. HAS SOME INCREASED EDEMA AND PAIN IN HER LEFT LEG. SHE DOES NOT WANT ANY STUDIES DONE. PT. IS ALSO CONCERNED ABOUT HER WHITE BLOOD COUNT AND WAS ASKING ABOUT LAB WORK. MICHELLE ASKED TO SPEAK WITH DR.MAGRINAT'S NURSE, VAL DODD,RN PER DR.MAGRINAT'S REQUEST.

## 2015-05-06 NOTE — Telephone Encounter (Signed)
This RN returned call to [place].  Per phone discussion with pt status update - at present concerns are:  1. Pt is asking " what my labs are "- may be precipated by family inquiring with her. Sharyn Lull had long discussion with pt regarding the " what if's " relating to possible abnormalities and how [place] would want to proceed. Sanyah seems more focused on " what my WBC is " and may be more for family asking her then pt wanting something more done.  2. Left leg swelling- per Sharyn Lull swelling overall is contained in leg with some noted 1+ pitting edema. No skin breakdown or focal area of tenderness, redness or increased warmth.  Discomfort is managed by use of tramadol or tylenol.  Sharyn Lull does state family seems to want pt to be more aggressive in care then pt discusses with her in private. Ishi says statements of understanding of situation and care being focused on comfort and quality with her main concern " is my family and how they are doing ".  The above was discussed with MD who recommended proceeding with CBC and Bmet for evaluation at present.  If gross abnormalities noted situation will be addressed with MD and pt.  All the above discussed with Sharyn Lull- who will obtain labs for MD review.  No other needs at this time.

## 2015-05-11 ENCOUNTER — Other Ambulatory Visit: Payer: Self-pay

## 2015-05-11 MED ORDER — GLUCOSE BLOOD VI STRP: ORAL_STRIP | Status: AC

## 2015-05-12 ENCOUNTER — Telehealth: Payer: Self-pay | Admitting: *Deleted

## 2015-05-12 NOTE — Telephone Encounter (Signed)
This RN spoke with Rip Harbour RN with [facility]- per lab results.  MD is very pleased with results- overall stable for known condition.  Rip Harbour will inform pt and family.

## 2015-05-20 ENCOUNTER — Ambulatory Visit: Payer: Medicare Other | Admitting: Cardiology

## 2015-05-20 ENCOUNTER — Encounter: Payer: Self-pay | Admitting: *Deleted

## 2015-05-21 ENCOUNTER — Telehealth: Payer: Self-pay | Admitting: *Deleted

## 2015-05-21 NOTE — Telephone Encounter (Signed)
Sharyn Lull, Hospice RN called stating that family member gave patient a 1mg  xanax tablet which made her feel "loopy". RN would like to inform MD. Message sent to MD

## 2015-05-23 NOTE — Telephone Encounter (Signed)
Val you may want to sugegst they give her no xanax or no more than 0.25 or she will become more confused..  Thanks!

## 2015-05-27 ENCOUNTER — Other Ambulatory Visit: Payer: Self-pay | Admitting: *Deleted

## 2015-05-28 ENCOUNTER — Telehealth: Payer: Self-pay | Admitting: *Deleted

## 2015-05-28 ENCOUNTER — Other Ambulatory Visit: Payer: Self-pay | Admitting: Oncology

## 2015-05-28 NOTE — Telephone Encounter (Signed)
VM message from [facility] to report pt died at home on Jun 12, 2015 @ 6:44 pm.

## 2015-05-31 ENCOUNTER — Encounter: Payer: Self-pay | Admitting: Cardiology

## 2015-06-01 ENCOUNTER — Telehealth: Payer: Self-pay | Admitting: Oncology

## 2015-06-01 NOTE — Telephone Encounter (Signed)
Received death certificate [birth year]

## 2015-06-02 ENCOUNTER — Other Ambulatory Visit: Payer: Self-pay | Admitting: *Deleted

## 2015-06-13 IMAGING — CR DG LUMBAR SPINE 2-3V
3 series · 3 of 3 positions shown · non-contrast
Comparison: DG LUMBAR SPINE 2-3V dated 02/11/2014;

CLINICAL DATA: Back pain.

EXAM:
LUMBAR SPINE - 2-3 VIEW

[t l-spine a.p.]
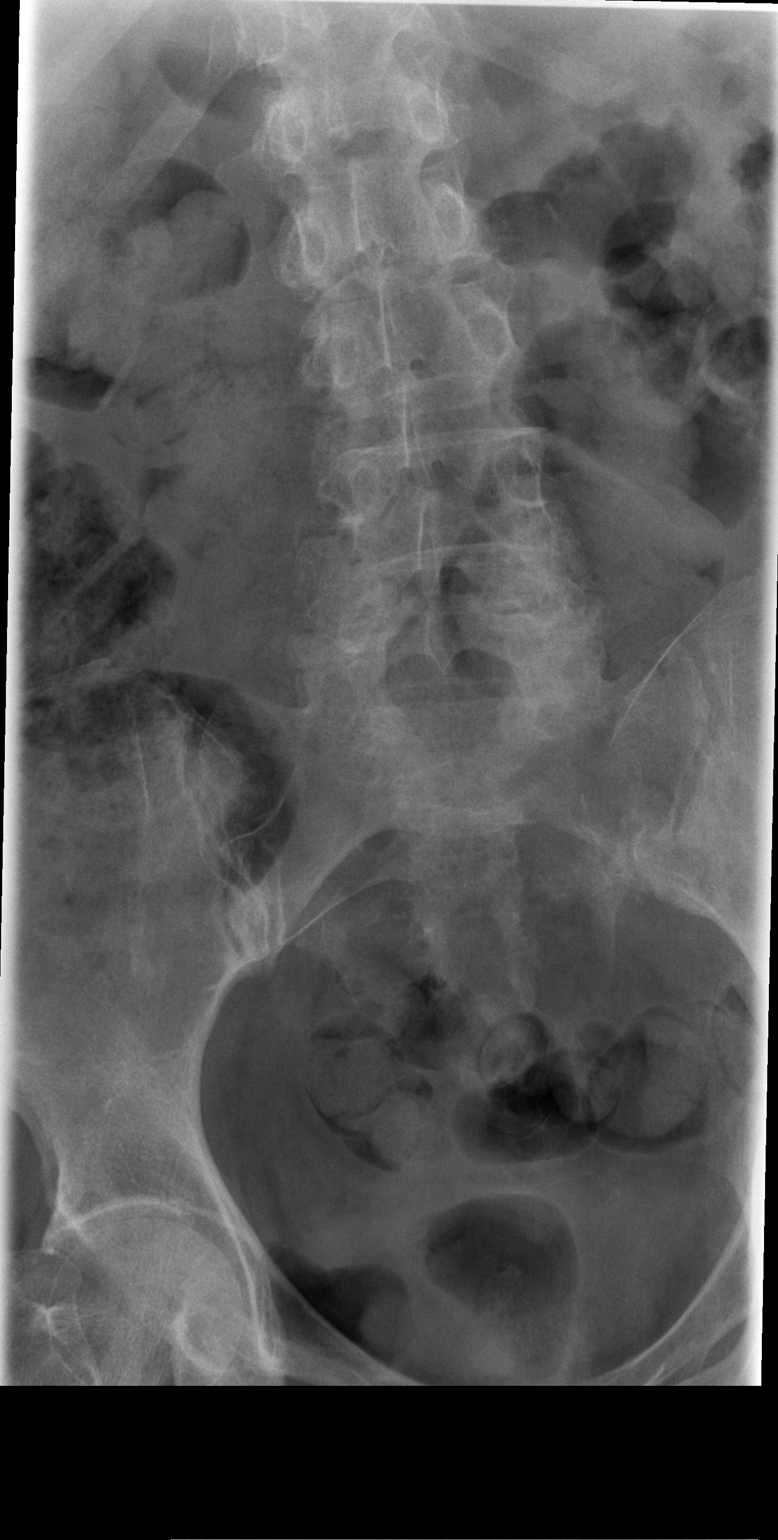

[t l-spine lat]
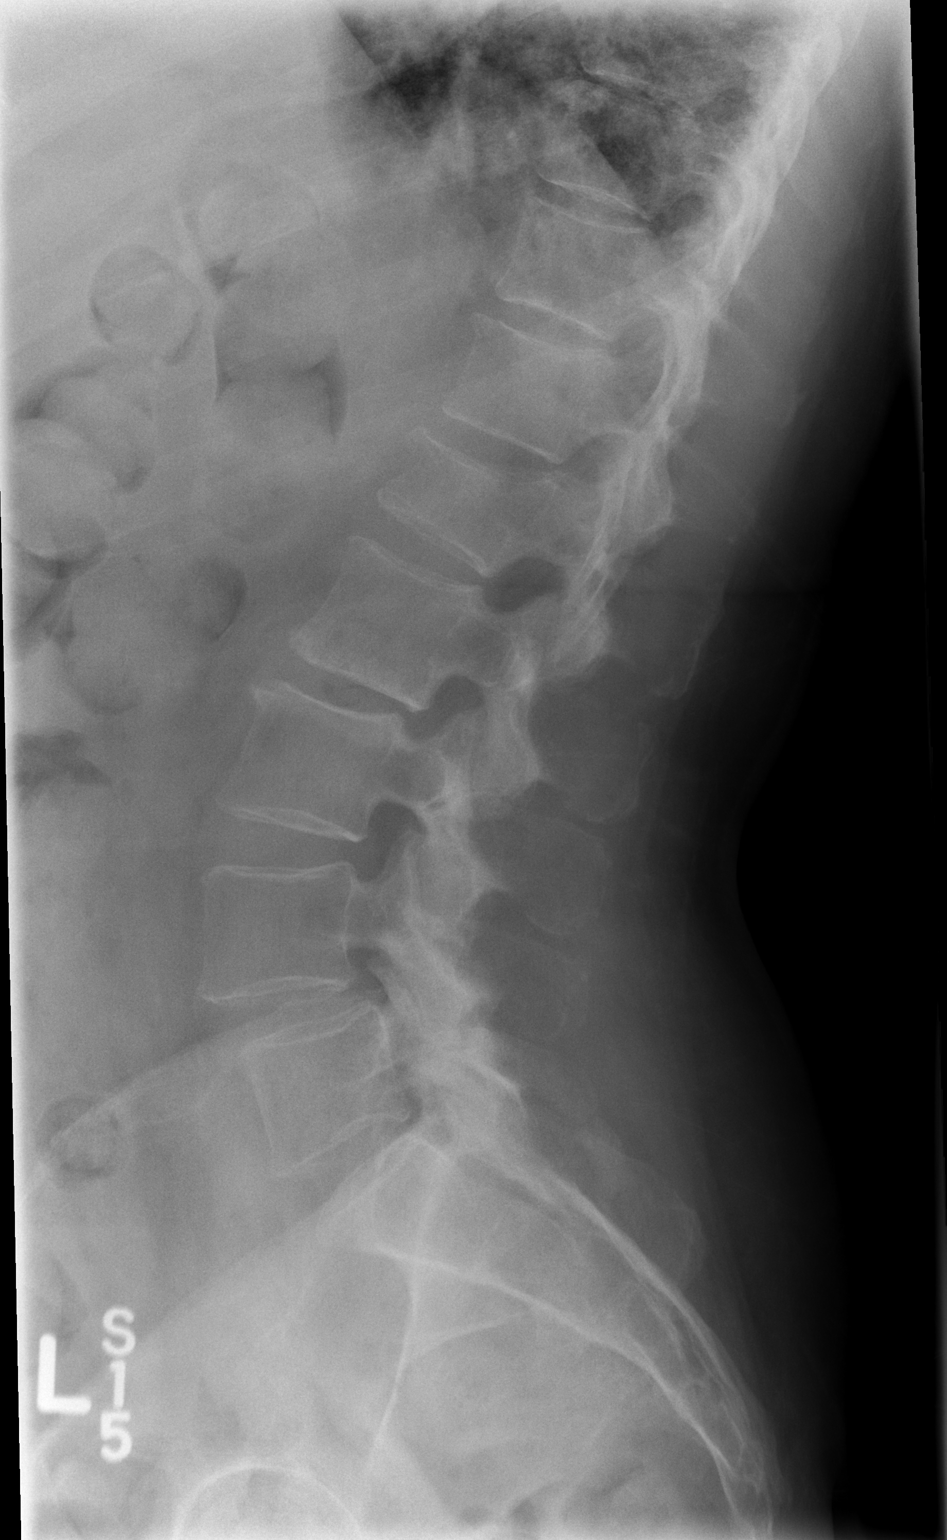

[t l-spine l5-s1 spot]
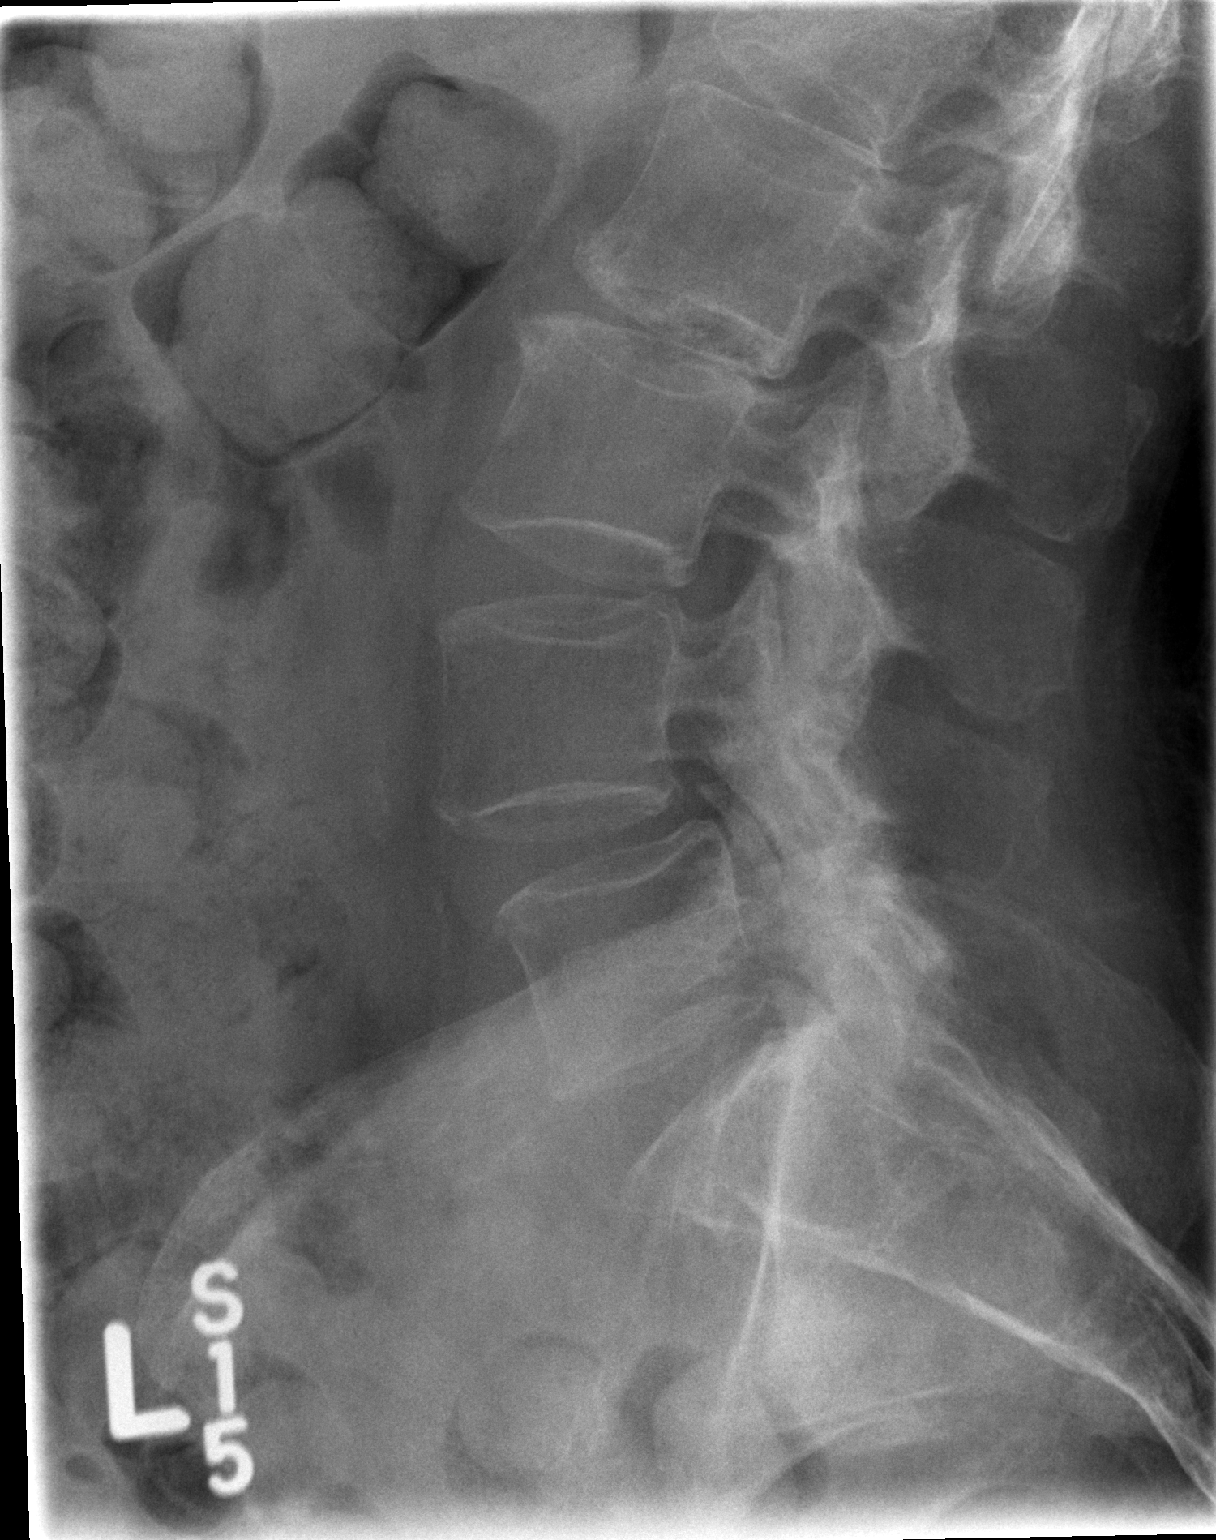

[3 of 3 positions shown; findings below may reference images not displayed]

DG CHEST 2 VIEW
dated 03/24/2014; CT ANGIO CHEST W/CM &/OR WO/CM dated 09/17/2013; DG
CHEST 2 VIEW dated 01/21/2013
FINDINGS: There are 5 lumbar type vertebral bodies. There is a degenerative
grade 1 anterolisthesis at L4-5 which appears stable. As
demonstrated on today's chest radiographs, there is a superior
endplate compression deformity at L1 which is new compared with the
lumbar spine radiographs obtained 6 weeks ago. This results in
approximately 15% loss of vertebral body height. The posterior
vertebral body cortex appears grossly intact. There is stable mild
disc space loss at L2-3. There is facet disease inferiorly.
IMPRESSION: Superior endplate compression deformity at L1 is new compared with
radiographs obtained 6 weeks ago consistent with an acute/subacute
injury. Stable anterolisthesis at L4-5.

## 2015-06-13 IMAGING — CR DG CHEST 2V
2 series · 2 of 2 positions shown · non-contrast
Comparison: 09/19/2013

CLINICAL DATA: Back pain

EXAM:
CHEST  2 VIEW

[x chest ap]
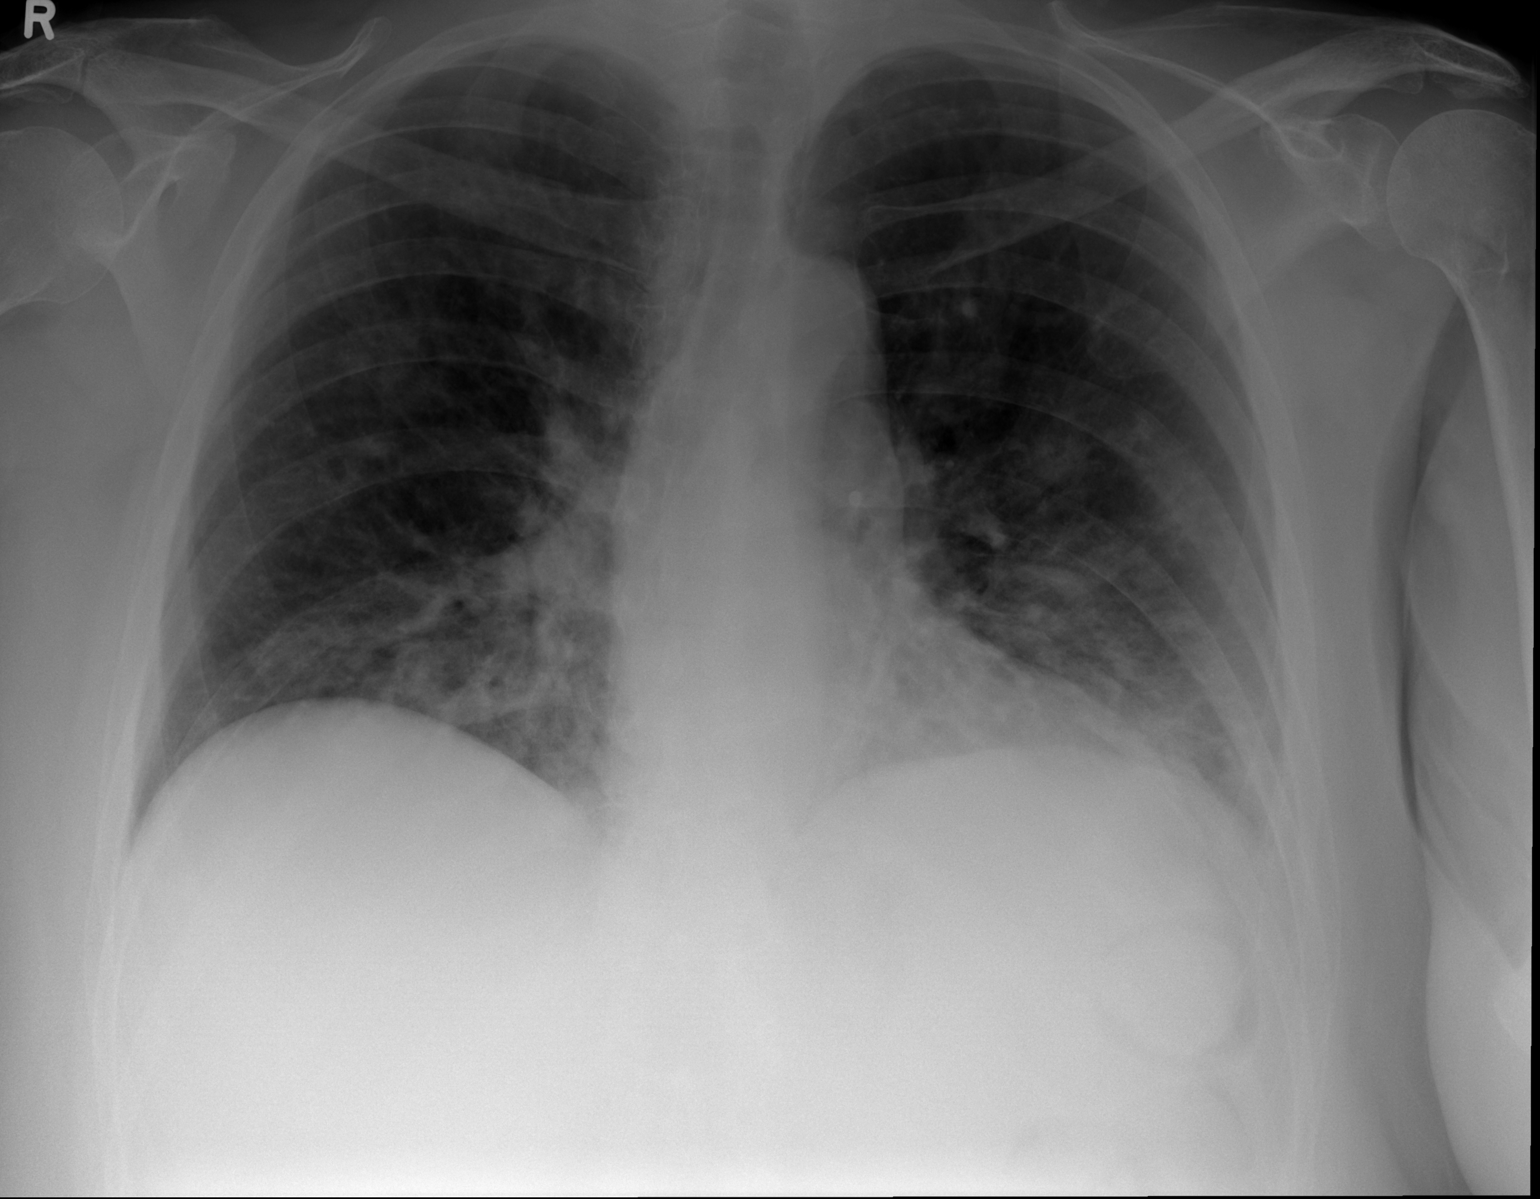

[w chest lat]
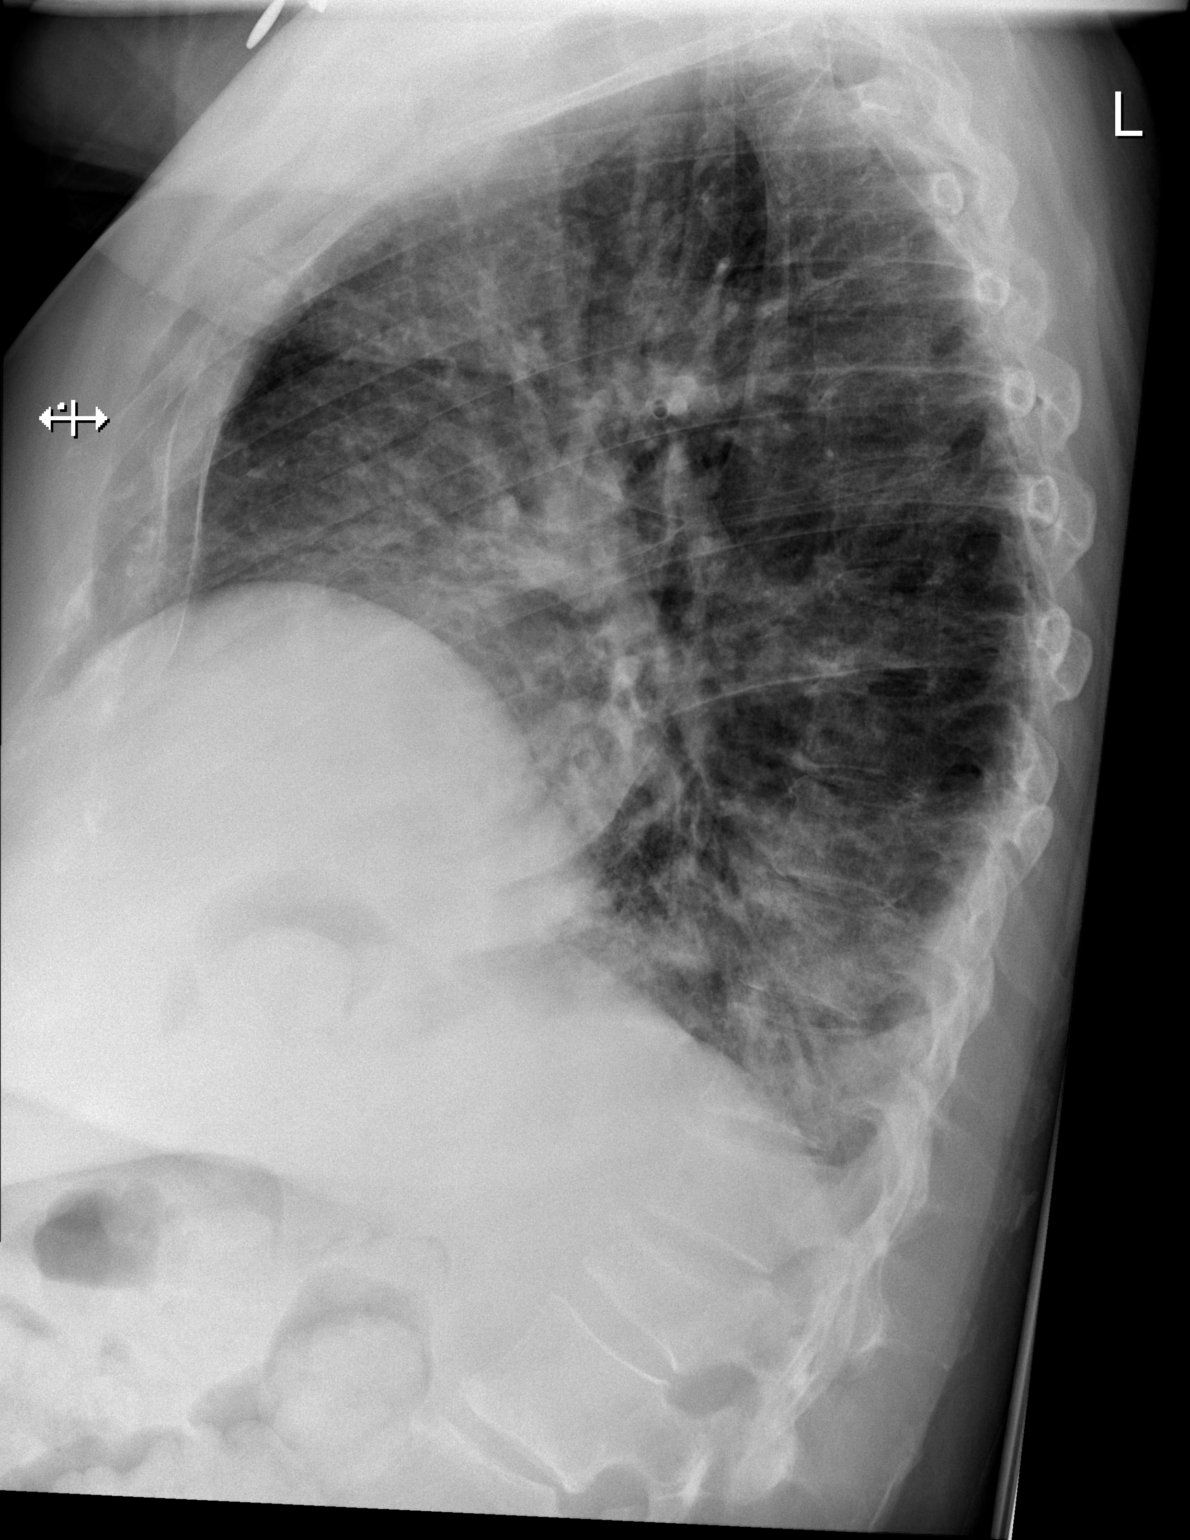

[2 of 2 positions shown; findings below may reference images not displayed]

FINDINGS: Cardiomediastinal silhouette is stable. There is streaky bilateral
basilar airspace disease highly suspicious for infiltrates. Best
seen on lateral view. No pulmonary edema. Osteopenia and
degenerative changes thoracic spine. There is moderate compression
deformity upper lumbar spine of indeterminate age. Clinical
correlation is necessary
IMPRESSION: Streaky bilateral basilar airspace disease suspicious for
infiltrates. Follow-up to resolution recommended. No pulmonary
edema. Moderate compression fracture upper lumbar spine of
indeterminate age. Clinical correlation is necessary.

## 2015-06-18 DEATH — deceased

## 2016-03-03 ENCOUNTER — Other Ambulatory Visit: Payer: Self-pay | Admitting: Nurse Practitioner
